# Patient Record
Sex: Female | Born: 1950 | Race: White | Hispanic: No | Marital: Married | Smoking: Former smoker
Health system: Southern US, Community
[De-identification: ages and names within clinical notes are randomized; demographics above are authoritative.]

## PROBLEM LIST (undated history)

## (undated) ENCOUNTER — Emergency Department (HOSPITAL_COMMUNITY): Payer: Medicare Other

## (undated) DIAGNOSIS — E11319 Type 2 diabetes mellitus with unspecified diabetic retinopathy without macular edema: Secondary | ICD-10-CM

## (undated) DIAGNOSIS — G4733 Obstructive sleep apnea (adult) (pediatric): Secondary | ICD-10-CM

## (undated) DIAGNOSIS — M797 Fibromyalgia: Secondary | ICD-10-CM

## (undated) DIAGNOSIS — G540 Brachial plexus disorders: Secondary | ICD-10-CM

## (undated) DIAGNOSIS — M069 Rheumatoid arthritis, unspecified: Secondary | ICD-10-CM

## (undated) DIAGNOSIS — Z91018 Allergy to other foods: Secondary | ICD-10-CM

## (undated) DIAGNOSIS — K760 Fatty (change of) liver, not elsewhere classified: Secondary | ICD-10-CM

## (undated) DIAGNOSIS — G894 Chronic pain syndrome: Secondary | ICD-10-CM

## (undated) DIAGNOSIS — N189 Chronic kidney disease, unspecified: Secondary | ICD-10-CM

## (undated) DIAGNOSIS — E7800 Pure hypercholesterolemia, unspecified: Secondary | ICD-10-CM

## (undated) DIAGNOSIS — M1990 Unspecified osteoarthritis, unspecified site: Secondary | ICD-10-CM

## (undated) DIAGNOSIS — R600 Localized edema: Secondary | ICD-10-CM

## (undated) DIAGNOSIS — G709 Myoneural disorder, unspecified: Secondary | ICD-10-CM

## (undated) DIAGNOSIS — M5136 Other intervertebral disc degeneration, lumbar region: Secondary | ICD-10-CM

## (undated) DIAGNOSIS — F419 Anxiety disorder, unspecified: Secondary | ICD-10-CM

## (undated) DIAGNOSIS — I00 Rheumatic fever without heart involvement: Secondary | ICD-10-CM

## (undated) DIAGNOSIS — R1310 Dysphagia, unspecified: Secondary | ICD-10-CM

## (undated) DIAGNOSIS — E1142 Type 2 diabetes mellitus with diabetic polyneuropathy: Secondary | ICD-10-CM

## (undated) DIAGNOSIS — G459 Transient cerebral ischemic attack, unspecified: Secondary | ICD-10-CM

## (undated) DIAGNOSIS — T7840XA Allergy, unspecified, initial encounter: Secondary | ICD-10-CM

## (undated) DIAGNOSIS — F32A Depression, unspecified: Secondary | ICD-10-CM

## (undated) DIAGNOSIS — K219 Gastro-esophageal reflux disease without esophagitis: Secondary | ICD-10-CM

## (undated) DIAGNOSIS — K589 Irritable bowel syndrome without diarrhea: Secondary | ICD-10-CM

## (undated) DIAGNOSIS — E785 Hyperlipidemia, unspecified: Secondary | ICD-10-CM

## (undated) DIAGNOSIS — N183 Chronic kidney disease, stage 3 unspecified: Secondary | ICD-10-CM

## (undated) DIAGNOSIS — K5900 Constipation, unspecified: Secondary | ICD-10-CM

## (undated) DIAGNOSIS — N2889 Other specified disorders of kidney and ureter: Secondary | ICD-10-CM

## (undated) DIAGNOSIS — M51369 Other intervertebral disc degeneration, lumbar region without mention of lumbar back pain or lower extremity pain: Secondary | ICD-10-CM

## (undated) DIAGNOSIS — F329 Major depressive disorder, single episode, unspecified: Secondary | ICD-10-CM

## (undated) DIAGNOSIS — Z5189 Encounter for other specified aftercare: Secondary | ICD-10-CM

## (undated) DIAGNOSIS — K259 Gastric ulcer, unspecified as acute or chronic, without hemorrhage or perforation: Secondary | ICD-10-CM

## (undated) DIAGNOSIS — I10 Essential (primary) hypertension: Secondary | ICD-10-CM

## (undated) DIAGNOSIS — IMO0002 Reserved for concepts with insufficient information to code with codable children: Secondary | ICD-10-CM

## (undated) DIAGNOSIS — H269 Unspecified cataract: Secondary | ICD-10-CM

## (undated) HISTORY — DX: Type 2 diabetes mellitus with unspecified diabetic retinopathy without macular edema: E11.319

## (undated) HISTORY — DX: Anxiety disorder, unspecified: F41.9

## (undated) HISTORY — DX: Unspecified cataract: H26.9

## (undated) HISTORY — DX: Constipation, unspecified: K59.00

## (undated) HISTORY — DX: Allergy, unspecified, initial encounter: T78.40XA

## (undated) HISTORY — PX: BREAST BIOPSY: SHX20

## (undated) HISTORY — DX: Dysphagia, unspecified: R13.10

## (undated) HISTORY — DX: Depression, unspecified: F32.A

## (undated) HISTORY — DX: Type 2 diabetes mellitus with diabetic polyneuropathy: E11.42

## (undated) HISTORY — PX: CARPAL TUNNEL RELEASE: SHX101

## (undated) HISTORY — DX: Fibromyalgia: M79.7

## (undated) HISTORY — DX: Essential (primary) hypertension: I10

## (undated) HISTORY — DX: Chronic kidney disease, unspecified: N18.9

## (undated) HISTORY — DX: Rheumatic fever without heart involvement: I00

## (undated) HISTORY — DX: Myoneural disorder, unspecified: G70.9

## (undated) HISTORY — DX: Unspecified osteoarthritis, unspecified site: M19.90

## (undated) HISTORY — DX: Other specified disorders of kidney and ureter: N28.89

## (undated) HISTORY — DX: Reserved for concepts with insufficient information to code with codable children: IMO0002

## (undated) HISTORY — PX: OTHER SURGICAL HISTORY: SHX169

## (undated) HISTORY — DX: Other intervertebral disc degeneration, lumbar region without mention of lumbar back pain or lower extremity pain: M51.369

## (undated) HISTORY — DX: Fatty (change of) liver, not elsewhere classified: K76.0

## (undated) HISTORY — DX: Rheumatoid arthritis, unspecified: M06.9

## (undated) HISTORY — DX: Encounter for other specified aftercare: Z51.89

## (undated) HISTORY — DX: Allergy to other foods: Z91.018

## (undated) HISTORY — DX: Irritable bowel syndrome, unspecified: K58.9

## (undated) HISTORY — DX: Gastro-esophageal reflux disease without esophagitis: K21.9

## (undated) HISTORY — DX: Gastric ulcer, unspecified as acute or chronic, without hemorrhage or perforation: K25.9

## (undated) HISTORY — DX: Brachial plexus disorders: G54.0

## (undated) HISTORY — DX: Obstructive sleep apnea (adult) (pediatric): G47.33

## (undated) HISTORY — DX: Chronic pain syndrome: G89.4

## (undated) HISTORY — DX: Transient cerebral ischemic attack, unspecified: G45.9

## (undated) HISTORY — DX: Chronic kidney disease, stage 3 unspecified: N18.30

## (undated) HISTORY — PX: CHOLECYSTECTOMY: SHX55

## (undated) HISTORY — DX: Hyperlipidemia, unspecified: E78.5

## (undated) HISTORY — PX: ABDOMINAL SURGERY: SHX537

## (undated) HISTORY — PX: TONSILLECTOMY: SUR1361

## (undated) HISTORY — DX: Pure hypercholesterolemia, unspecified: E78.00

## (undated) HISTORY — DX: Chronic kidney disease, stage 3 (moderate): N18.3

## (undated) HISTORY — DX: Localized edema: R60.0

## (undated) HISTORY — DX: Other intervertebral disc degeneration, lumbar region: M51.36

## (undated) HISTORY — PX: CATARACT EXTRACTION, BILATERAL: SHX1313

## (undated) HISTORY — PX: EYE SURGERY: SHX253

## (undated) HISTORY — DX: Major depressive disorder, single episode, unspecified: F32.9

---

## 1979-11-02 HISTORY — PX: ABDOMINAL HYSTERECTOMY: SHX81

## 1998-04-11 ENCOUNTER — Encounter: Admission: RE | Admit: 1998-04-11 | Discharge: 1998-04-11 | Payer: Self-pay | Admitting: Internal Medicine

## 1998-05-08 ENCOUNTER — Emergency Department (HOSPITAL_COMMUNITY): Admission: EM | Admit: 1998-05-08 | Discharge: 1998-05-08 | Payer: Self-pay | Admitting: Emergency Medicine

## 1998-05-20 ENCOUNTER — Encounter: Admission: RE | Admit: 1998-05-20 | Discharge: 1998-05-20 | Payer: Self-pay | Admitting: Internal Medicine

## 1998-06-25 ENCOUNTER — Ambulatory Visit (HOSPITAL_COMMUNITY): Admission: RE | Admit: 1998-06-25 | Discharge: 1998-06-25 | Payer: Self-pay | Admitting: Specialist

## 1998-07-15 ENCOUNTER — Ambulatory Visit (HOSPITAL_COMMUNITY): Admission: RE | Admit: 1998-07-15 | Discharge: 1998-07-15 | Payer: Self-pay | Admitting: Specialist

## 1998-08-14 ENCOUNTER — Encounter: Admission: RE | Admit: 1998-08-14 | Discharge: 1998-08-14 | Payer: Self-pay | Admitting: Internal Medicine

## 1998-08-14 ENCOUNTER — Ambulatory Visit (HOSPITAL_COMMUNITY): Admission: RE | Admit: 1998-08-14 | Discharge: 1998-08-14 | Payer: Self-pay | Admitting: Internal Medicine

## 1999-01-28 ENCOUNTER — Encounter: Admission: RE | Admit: 1999-01-28 | Discharge: 1999-01-28 | Payer: Self-pay | Admitting: Internal Medicine

## 1999-02-02 ENCOUNTER — Encounter: Admission: RE | Admit: 1999-02-02 | Discharge: 1999-02-02 | Payer: Self-pay | Admitting: Internal Medicine

## 1999-02-23 ENCOUNTER — Encounter: Admission: RE | Admit: 1999-02-23 | Discharge: 1999-05-24 | Payer: Self-pay | Admitting: *Deleted

## 1999-04-08 ENCOUNTER — Encounter: Admission: RE | Admit: 1999-04-08 | Discharge: 1999-04-08 | Payer: Self-pay | Admitting: Internal Medicine

## 1999-06-29 ENCOUNTER — Encounter: Admission: RE | Admit: 1999-06-29 | Discharge: 1999-09-27 | Payer: Self-pay | Admitting: *Deleted

## 1999-12-28 ENCOUNTER — Encounter: Admission: RE | Admit: 1999-12-28 | Discharge: 1999-12-28 | Payer: Self-pay | Admitting: Internal Medicine

## 2000-03-11 ENCOUNTER — Encounter: Admission: RE | Admit: 2000-03-11 | Discharge: 2000-03-11 | Payer: Self-pay | Admitting: Internal Medicine

## 2000-04-18 ENCOUNTER — Encounter: Admission: RE | Admit: 2000-04-18 | Discharge: 2000-04-18 | Payer: Self-pay | Admitting: Hematology and Oncology

## 2000-06-13 ENCOUNTER — Emergency Department (HOSPITAL_COMMUNITY): Admission: EM | Admit: 2000-06-13 | Discharge: 2000-06-13 | Payer: Self-pay | Admitting: Emergency Medicine

## 2000-09-19 ENCOUNTER — Encounter: Admission: RE | Admit: 2000-09-19 | Discharge: 2000-09-19 | Payer: Self-pay | Admitting: Internal Medicine

## 2000-10-03 ENCOUNTER — Encounter: Admission: RE | Admit: 2000-10-03 | Discharge: 2000-10-03 | Payer: Self-pay | Admitting: Internal Medicine

## 2000-10-21 ENCOUNTER — Encounter: Admission: RE | Admit: 2000-10-21 | Discharge: 2000-10-21 | Payer: Self-pay | Admitting: Internal Medicine

## 2000-11-18 ENCOUNTER — Emergency Department (HOSPITAL_COMMUNITY): Admission: EM | Admit: 2000-11-18 | Discharge: 2000-11-19 | Payer: Self-pay | Admitting: Emergency Medicine

## 2001-09-12 ENCOUNTER — Encounter: Admission: RE | Admit: 2001-09-12 | Discharge: 2001-09-12 | Payer: Self-pay

## 2001-09-19 ENCOUNTER — Ambulatory Visit (HOSPITAL_COMMUNITY): Admission: RE | Admit: 2001-09-19 | Discharge: 2001-09-19 | Payer: Self-pay

## 2001-10-17 ENCOUNTER — Encounter: Admission: RE | Admit: 2001-10-17 | Discharge: 2001-10-17 | Payer: Self-pay | Admitting: *Deleted

## 2001-12-19 ENCOUNTER — Encounter: Admission: RE | Admit: 2001-12-19 | Discharge: 2001-12-19 | Payer: Self-pay | Admitting: Internal Medicine

## 2001-12-21 ENCOUNTER — Inpatient Hospital Stay (HOSPITAL_COMMUNITY): Admission: EM | Admit: 2001-12-21 | Discharge: 2001-12-29 | Payer: Self-pay | Admitting: Emergency Medicine

## 2001-12-21 ENCOUNTER — Encounter (INDEPENDENT_AMBULATORY_CARE_PROVIDER_SITE_OTHER): Payer: Self-pay | Admitting: Specialist

## 2001-12-23 ENCOUNTER — Encounter: Payer: Self-pay | Admitting: Emergency Medicine

## 2002-01-02 ENCOUNTER — Encounter: Admission: RE | Admit: 2002-01-02 | Discharge: 2002-01-02 | Payer: Self-pay | Admitting: *Deleted

## 2002-03-07 ENCOUNTER — Encounter: Admission: RE | Admit: 2002-03-07 | Discharge: 2002-03-07 | Payer: Self-pay

## 2002-05-11 ENCOUNTER — Encounter: Admission: RE | Admit: 2002-05-11 | Discharge: 2002-05-11 | Payer: Self-pay | Admitting: Internal Medicine

## 2003-01-12 ENCOUNTER — Emergency Department (HOSPITAL_COMMUNITY): Admission: EM | Admit: 2003-01-12 | Discharge: 2003-01-12 | Payer: Self-pay | Admitting: Emergency Medicine

## 2004-02-01 ENCOUNTER — Emergency Department (HOSPITAL_COMMUNITY): Admission: EM | Admit: 2004-02-01 | Discharge: 2004-02-01 | Payer: Self-pay

## 2004-03-04 ENCOUNTER — Emergency Department (HOSPITAL_COMMUNITY): Admission: EM | Admit: 2004-03-04 | Discharge: 2004-03-04 | Payer: Self-pay | Admitting: Emergency Medicine

## 2004-05-23 ENCOUNTER — Emergency Department (HOSPITAL_COMMUNITY): Admission: EM | Admit: 2004-05-23 | Discharge: 2004-05-23 | Payer: Self-pay | Admitting: Emergency Medicine

## 2007-11-02 HISTORY — PX: CARDIAC CATHETERIZATION: SHX172

## 2008-09-27 ENCOUNTER — Emergency Department (HOSPITAL_COMMUNITY): Admission: EM | Admit: 2008-09-27 | Discharge: 2008-09-27 | Payer: Self-pay | Admitting: Emergency Medicine

## 2009-07-18 ENCOUNTER — Ambulatory Visit: Payer: Self-pay | Admitting: Internal Medicine

## 2009-07-18 ENCOUNTER — Inpatient Hospital Stay (HOSPITAL_COMMUNITY): Admission: EM | Admit: 2009-07-18 | Discharge: 2009-07-21 | Payer: Self-pay | Admitting: Emergency Medicine

## 2011-02-05 LAB — CBC
HCT: 31.3 % — ABNORMAL LOW (ref 36.0–46.0)
Hemoglobin: 10.6 g/dL — ABNORMAL LOW (ref 12.0–15.0)
Hemoglobin: 9.7 g/dL — ABNORMAL LOW (ref 12.0–15.0)
MCHC: 33.8 g/dL (ref 30.0–36.0)
MCHC: 33.9 g/dL (ref 30.0–36.0)
MCV: 90.4 fL (ref 78.0–100.0)
Platelets: 280 10*3/uL (ref 150–400)
Platelets: 293 10*3/uL (ref 150–400)
Platelets: 339 10*3/uL (ref 150–400)
RBC: 3.46 MIL/uL — ABNORMAL LOW (ref 3.87–5.11)
RDW: 13.4 % (ref 11.5–15.5)
RDW: 13.4 % (ref 11.5–15.5)
RDW: 13.6 % (ref 11.5–15.5)
WBC: 9.3 10*3/uL (ref 4.0–10.5)

## 2011-02-05 LAB — CARDIAC PANEL(CRET KIN+CKTOT+MB+TROPI)
CK, MB: 1.8 ng/mL (ref 0.3–4.0)
CK, MB: 1.9 ng/mL (ref 0.3–4.0)
Total CK: 108 U/L (ref 7–177)
Total CK: 79 U/L (ref 7–177)
Troponin I: 0.01 ng/mL (ref 0.00–0.06)

## 2011-02-05 LAB — LIPID PANEL
Cholesterol: 184 mg/dL (ref 0–200)
LDL Cholesterol: 108 mg/dL — ABNORMAL HIGH (ref 0–99)
Triglycerides: 43 mg/dL (ref <150)

## 2011-02-05 LAB — DIFFERENTIAL
Lymphocytes Relative: 25 % (ref 12–46)
Lymphs Abs: 2.3 10*3/uL (ref 0.7–4.0)
Monocytes Absolute: 0.4 10*3/uL (ref 0.1–1.0)
Monocytes Relative: 4 % (ref 3–12)
Neutro Abs: 6.3 10*3/uL (ref 1.7–7.7)
Neutrophils Relative %: 68 % (ref 43–77)

## 2011-02-05 LAB — GLUCOSE, CAPILLARY
Glucose-Capillary: 120 mg/dL — ABNORMAL HIGH (ref 70–99)
Glucose-Capillary: 153 mg/dL — ABNORMAL HIGH (ref 70–99)
Glucose-Capillary: 202 mg/dL — ABNORMAL HIGH (ref 70–99)
Glucose-Capillary: 215 mg/dL — ABNORMAL HIGH (ref 70–99)
Glucose-Capillary: 98 mg/dL (ref 70–99)

## 2011-02-05 LAB — HEPARIN LEVEL (UNFRACTIONATED)
Heparin Unfractionated: 0.2 IU/mL — ABNORMAL LOW (ref 0.30–0.70)
Heparin Unfractionated: 0.38 IU/mL (ref 0.30–0.70)

## 2011-02-05 LAB — BASIC METABOLIC PANEL
BUN: 21 mg/dL (ref 6–23)
CO2: 24 mEq/L (ref 19–32)
Calcium: 9.3 mg/dL (ref 8.4–10.5)
Chloride: 105 mEq/L (ref 96–112)
Creatinine, Ser: 1.02 mg/dL (ref 0.4–1.2)
GFR calc Af Amer: >60 mL/min (ref >60)
GFR calc non Af Amer: 56 mL/min — ABNORMAL LOW (ref >60)
Glucose, Bld: 67 mg/dL — ABNORMAL LOW (ref 70–99)
Potassium: 4.9 mEq/L (ref 3.5–5.1)
Sodium: 136 mEq/L (ref 135–145)

## 2011-02-05 LAB — IRON AND TIBC
Saturation Ratios: 21 % (ref 20–55)
UIBC: 277 ug/dL

## 2011-02-05 LAB — CK TOTAL AND CKMB (NOT AT ARMC)
CK, MB: 2.4 ng/mL (ref 0.3–4.0)
Relative Index: 2.3 (ref 0.0–2.5)
Total CK: 103 U/L (ref 7–177)

## 2011-02-05 LAB — HEMOGLOBIN A1C
Hgb A1c MFr Bld: 5.8 % (ref 4.6–6.1)
Mean Plasma Glucose: 120 mg/dL

## 2011-02-05 LAB — TSH: TSH: 1.926 u[IU]/mL (ref 0.350–4.500)

## 2011-02-17 ENCOUNTER — Other Ambulatory Visit: Payer: Self-pay | Admitting: Dermatology

## 2011-03-19 NOTE — Op Note (Signed)
[place]. [facility]  Patient:    Angel French Visit Number: [id number] MRN: [id number]          Service Type: MED Location: [id number] Attending Physician:  Phifer, Trinna Post Dictated by:   Adolph Pollack, M.D. Proc. Date: 12/23/01 Admit Date:  12/21/2001                             Operative Report  INCOMPLETE  PREOPERATIVE DIAGNOSIS:  Abdominal wall abscess.  POSTOPERATIVE DIAGNOSIS:  Abdominal wall abscess.  PROCEDURE:  Complex incision and drainage of abdominal wall abscess.  SURGEON:  Adolph Pollack, M.D.  ANESTHESIA:  General.  INDICATION:  [name] is a 60 year old insulin-dependent diabetic admitted 12/21/01.  She developed an abdominal wall infection and was tried on oral antibiotics as an outpatient, but these failed.  She subsequently was brought into the hospital and placed on IV antibiotics, but her condition has not improved.  She continues to spike high fevers.  She has an area of cellulitis with an indurated area of skin that is discolored.  She is now brought to the operating room for incision and drainage.  TECHNIQUE:  After the procedure and the risks were discussed with her including but not limited to bleeding, recurrent infection , and anesthesia, she understood these and agreed to proceed.  She was then brought to the operating room and given a general anesthetic.  The left lower quadrant of the abdomen was sterilely prepped and draped.  An elliptical incision was made around the area of skin-type necrosis, and the purulent cavity entered.  The fluid was sent for culture. Dictated by:   Adolph Pollack, M.D. Attending Physician:  Phifer, Trinna Post DD:  12/23/01 TD:  12/24/01 Job: [id number]

## 2011-03-19 NOTE — Discharge Summary (Signed)
[place]. [facility]  Patient:    Angel French Visit Number: [id number] MRN: [id number]          Service Type: MED Location: [id number] Attending Physician:  Phifer, Trinna Post Dictated by:   Juanell Fairly, M.D. Admit Date:  12/21/2001 Discharge Date: 12/29/2001   CC:         Angel French. Angel French, M.D.  Angel French, M.D.   Discharge Summary  DATE OF BIRTH:  [birth year]  DISCHARGE DIAGNOSES: 1. Abscess. 2. Diabetes mellitus. 3. Anemia. 4. Hypokalemia.  DISCHARGE MEDICATIONS: 1. Resume home medications. 2. Augmentin 875 mg p.o. b.i.d. 3. Abreva five times a day until lip lesion cleared. 4. Vicodin 5/500 one to two tablets p.o. q.6h. p.r.n. pain.  SPECIAL INSTRUCTIONS:  She was instructed to have no pets in her bed.  She will be visited by home health care nursing to change dressings daily.  DIET:  ADA diet.  FOLLOWUP:  She was instructed to call Dr. Abbey Chatters for a follow-up appointment in one week and to call the outpatient clinics for an appointment in one week.  HISTORY OF PRESENT ILLNESS:  Angel French is a 60 year old female who presented with a six-day history of abdominal pain.  She went to the outpatient clinic two days prior to admission and was given Augmentin and Darvocet.  Her abdominal pain did not improve and she had erythema with an increasing diameter and began having nausea, vomiting and diarrhea.  She also had subjective chills and fevers.  PHYSICAL EXAMINATION:  VITAL SIGNS:  Temperature 98.4, blood pressure 112/60, heart rate 102, respiratory rate 20, O2 saturations 100% on room air.  ABDOMEN:  She had a 20 x 30 cm of erythema in the left lower quadrant of induration and swelling approximately 3 x 5 cm.  HOSPITAL COURSE:  #1 - ABSCESS AND CELLULITIS:  The patient failed outpatient treatment with Augmentin and has cellulitis complicated by the fact that she has diabetes.  Therefore, she was  placed on imipenem.  A surgery consult was obtained and they subsequently incised and drained the central area of induration.  She was placed on vacuum-assisted closure on February 24, two days status post I&D.  She subsequently had some erythema and tenderness around the edges of her wound.  The wound V.A.C. was discontinued and the erythema resolved.  On February 28, postop day #6 and total antibiotic day #7 including three days of Augmentin and four days of imipenem, the patient was discharged home with Augmentin.  #2 - DIABETES:  On admission, the patients CBGs were not well-controlled. Prior to admission, her CBG was 89.  The morning after admission, it was 219. She was placed on q.6h. CBGs and a sliding scale until she began eating at which point she was placed on her home dose of Lantus and sliding scale.  By discharge, her CBGs were ranging between 90 and 165.  #3 - ANEMIA:  This was secondary to blood loss during her I&D and was stable on discharge at around 10.  #4 - HYPOKALEMIA:  This was secondary to vomiting prior to admission.  Her potassium was repleted and by discharge was 3.8.  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  DISCHARGE LABORATORY DATA AND X-RAY FINDINGS:  WBC 7.3, which was down from 13.3 on admission, hemoglobin 10.7, hematocrit 31.3, MCV 86.9, platelet count 343.  Sodium 141, potassium 3.8, chloride 105, bicarb 30, glucose  147, BUN 5, creatinine 0.7, calcium 7.9.  Wound cultures from her I&D showed no growth. Dictated by:   Juanell Fairly, M.D. Attending Physician:  Phifer, Trinna Post DD:  01/11/02 TD:  01/12/02 Job: [id number]

## 2011-03-19 NOTE — Op Note (Signed)
[place]. [facility]  Patient:    Angel French Visit Number: [id number] MRN: [id number]          Service Type: MED Location: [phone] Attending Physician:  Phifer, Trinna Post Dictated by:   Adolph Pollack, M.D. Proc. Date: 12/23/01 Admit Date:  12/21/2001                             Operative Report  PREOPERATIVE DIAGNOSIS:  Complex abdominal wall abscess.  POSTOPERATIVE DIAGNOSIS:  Complex abdominal wall abscess.  PROCEDURE:  Complex incision and drainage of abdominal wall abscess.  SURGEON:  Adolph Pollack, M.D.  ANESTHESIA:  General.  INDICATION:  This is a 60 year old insulin-dependent diabetic who has developed a left lower quadrant cellulitis and indurated area.  It has not responded to antibiotics.  She now presents for incision and drainage.  TECHNIQUE:  She was placed supine on the operating table, and general anesthetic was administered.  The abdomen was sterilely prepped and draped. An elliptical incision was made around the indurated area which had necrotic skin in it.  The full-thickness elliptical mass was removed, containing purulent material.  Some of this purulent material was cultured.  Next, the wound was inspected, and subcutaneous tissue noted.  Bleeding was controlled with cautery and interrupted 4-0 Vicryl sutures.  A moist gauze was then placed in the wound, and dry dressings were applied.  She tolerated the procedure well without any apparent complications and was taken to the recovery room in satisfactory condition.  Postoperatively, we will try vacuum-assisted closure later next week. Dictated by:   Adolph Pollack, M.D. Attending Physician:  Phifer, Trinna Post DD:  12/23/01 TD:  12/24/01 Job: [id number]

## 2011-10-03 ENCOUNTER — Emergency Department (HOSPITAL_BASED_OUTPATIENT_CLINIC_OR_DEPARTMENT_OTHER)
Admission: EM | Admit: 2011-10-03 | Discharge: 2011-10-03 | Disposition: A | Discharge status: Home / Self Care | Payer: No Typology Code available for payment source | Attending: Emergency Medicine | Admitting: Emergency Medicine

## 2011-10-03 ENCOUNTER — Emergency Department (INDEPENDENT_AMBULATORY_CARE_PROVIDER_SITE_OTHER): Payer: No Typology Code available for payment source

## 2011-10-03 ENCOUNTER — Encounter: Payer: Self-pay | Admitting: Emergency Medicine

## 2011-10-03 DIAGNOSIS — S9030XA Contusion of unspecified foot, initial encounter: Secondary | ICD-10-CM

## 2011-10-03 DIAGNOSIS — R0789 Other chest pain: Secondary | ICD-10-CM

## 2011-10-03 DIAGNOSIS — Y92410 Unspecified street and highway as the place of occurrence of the external cause: Secondary | ICD-10-CM | POA: Insufficient documentation

## 2011-10-03 DIAGNOSIS — S46919A Strain of unspecified muscle, fascia and tendon at shoulder and upper arm level, unspecified arm, initial encounter: Secondary | ICD-10-CM

## 2011-10-03 DIAGNOSIS — E119 Type 2 diabetes mellitus without complications: Secondary | ICD-10-CM | POA: Insufficient documentation

## 2011-10-03 DIAGNOSIS — M542 Cervicalgia: Secondary | ICD-10-CM

## 2011-10-03 DIAGNOSIS — R071 Chest pain on breathing: Secondary | ICD-10-CM | POA: Insufficient documentation

## 2011-10-03 DIAGNOSIS — IMO0002 Reserved for concepts with insufficient information to code with codable children: Secondary | ICD-10-CM | POA: Insufficient documentation

## 2011-10-03 DIAGNOSIS — M47812 Spondylosis without myelopathy or radiculopathy, cervical region: Secondary | ICD-10-CM

## 2011-10-03 DIAGNOSIS — S139XXA Sprain of joints and ligaments of unspecified parts of neck, initial encounter: Secondary | ICD-10-CM | POA: Insufficient documentation

## 2011-10-03 DIAGNOSIS — S161XXA Strain of muscle, fascia and tendon at neck level, initial encounter: Secondary | ICD-10-CM

## 2011-10-03 DIAGNOSIS — M25519 Pain in unspecified shoulder: Secondary | ICD-10-CM

## 2011-10-03 DIAGNOSIS — M79609 Pain in unspecified limb: Secondary | ICD-10-CM

## 2011-10-03 DIAGNOSIS — Z8739 Personal history of other diseases of the musculoskeletal system and connective tissue: Secondary | ICD-10-CM | POA: Insufficient documentation

## 2011-10-03 HISTORY — DX: Unspecified osteoarthritis, unspecified site: M19.90

## 2011-10-03 MED ORDER — HYDROCODONE-ACETAMINOPHEN 10-500 MG PO TABS: 1.0000 | ORAL_TABLET | Freq: Four times a day (QID) | ORAL | Status: AC | PRN

## 2011-10-03 NOTE — ED Provider Notes (Signed)
Medical screening examination/treatment/procedure(s) were performed by non-physician practitioner and as supervising physician I was immediately available for consultation/collaboration.  Golden Emile, MD 10/03/11 2118 
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
## 2012-11-30 ENCOUNTER — Ambulatory Visit: Payer: Self-pay

## 2012-11-30 ENCOUNTER — Ambulatory Visit: Payer: Self-pay | Admitting: Family Medicine

## 2012-11-30 VITALS — BP 123/85 | HR 139 | Temp 99.0°F | Resp 24 | Ht 63.5 in | Wt 221.0 lb

## 2012-11-30 DIAGNOSIS — B379 Candidiasis, unspecified: Secondary | ICD-10-CM

## 2012-11-30 DIAGNOSIS — R05 Cough: Secondary | ICD-10-CM

## 2012-11-30 DIAGNOSIS — R110 Nausea: Secondary | ICD-10-CM

## 2012-11-30 DIAGNOSIS — R509 Fever, unspecified: Secondary | ICD-10-CM

## 2012-11-30 DIAGNOSIS — E119 Type 2 diabetes mellitus without complications: Secondary | ICD-10-CM

## 2012-11-30 DIAGNOSIS — B49 Unspecified mycosis: Secondary | ICD-10-CM

## 2012-11-30 DIAGNOSIS — R6889 Other general symptoms and signs: Secondary | ICD-10-CM

## 2012-11-30 DIAGNOSIS — R300 Dysuria: Secondary | ICD-10-CM

## 2012-11-30 DIAGNOSIS — J209 Acute bronchitis, unspecified: Secondary | ICD-10-CM

## 2012-11-30 LAB — POCT URINALYSIS DIPSTICK
Nitrite, UA: NEGATIVE
Urobilinogen, UA: 0.2
pH, UA: 5.5

## 2012-11-30 LAB — POCT CBC
Granulocyte percent: 76.3 %G (ref 37–80)
MID (cbc): 0.8 (ref 0–0.9)
MPV: 7.4 fL (ref 0–99.8)
POC Granulocyte: 10.9 — AB (ref 2–6.9)
POC MID %: 5.6 %M (ref 0–12)
Platelet Count, POC: 389 10*3/uL (ref 142–424)
RBC: 3.78 M/uL — AB (ref 4.04–5.48)

## 2012-11-30 LAB — POCT INFLUENZA A/B
Influenza A, POC: NEGATIVE
Influenza B, POC: NEGATIVE

## 2012-11-30 LAB — POCT UA - MICROSCOPIC ONLY
Casts, Ur, LPF, POC: NEGATIVE
Crystals, Ur, HPF, POC: NEGATIVE

## 2012-11-30 MED ORDER — FLUCONAZOLE 150 MG PO TABS: 150.0000 mg | ORAL_TABLET | Freq: Once | ORAL | Status: DC

## 2012-11-30 MED ORDER — IPRATROPIUM BROMIDE 0.03 % NA SOLN: 2.0000 | Freq: Two times a day (BID) | NASAL | Status: DC

## 2012-11-30 MED ORDER — AZITHROMYCIN 250 MG PO TABS: ORAL_TABLET | ORAL | Status: DC

## 2012-11-30 MED ORDER — ONDANSETRON 8 MG PO TBDP: 8.0000 mg | ORAL_TABLET | Freq: Three times a day (TID) | ORAL | Status: DC | PRN

## 2012-11-30 MED ORDER — ONDANSETRON 4 MG PO TBDP
8.0000 mg | ORAL_TABLET | Freq: Once | ORAL | Status: AC
  Administered 2012-11-30: 8 mg via ORAL

## 2012-11-30 MED ORDER — NYSTATIN 100000 UNIT/GM EX CREA: TOPICAL_CREAM | Freq: Two times a day (BID) | CUTANEOUS | Status: DC

## 2012-11-30 NOTE — Progress Notes (Signed)
[address]   [place], [place]  [address]   [phone]  Subjective:    Patient ID: Angel French, female    DOB: 1951-08-01, 62 y.o.   MRN: [id number]  HPIThis 62 y.o. female presents for evaluation of cough, congestion. Onset ten days ago with fevers.  Improved until one week ago and clinically worsened.  Started hallucinating one week ago; refused to allow daughter to take to ED; no further hallucinations in past week.  The following day, clinically improved.  Yesterday was horrible; acutely SOB; drowning in own drainage.  Stress incontinence with coughing; +dysuria with incontinence.  Called daughter and asked her to bring to doctor.  Very sick.  Chest hurts. Bone hurts; back hurts; rib hurts; long bones hurt.  Last fever persistent; Tmax 103.2. Horrible headache and radiating into neck, shoulders, back of head.  Worried about brain aneurysm; worst headache of life.  Lost ten pounds in ten days.  Ears feel funny; no pain; throat is intermittently scratchy.  Rhinorrhea severe; drainage clear thick.  +PND.  +coughing; +SOB; intermittent sputum.  Loose stools but no diarrhea; +nausea; no vomiting.  S/p flu vaccine.  Sugars running 93, 95.  Taking medication most of them.  No tobacco in twenty years.  Delsym, ASA, Tylenol, Ibuprofen.   At 11:00, 2 crackers, 4 ounces of tea.   Yesterday, drank diet Mt. Dew 8 ounces; no food yesterday.  PCP: Sandre Kitty; does not drive.   Review of Systems  Constitutional: Positive for fever, chills, diaphoresis and fatigue.  HENT: Positive for congestion, sore throat, rhinorrhea and voice change. Negative for ear pain, trouble swallowing and sinus pressure.   Respiratory: Positive for cough and shortness of breath. Negative for wheezing and stridor.   Gastrointestinal: Positive for nausea and diarrhea. Negative for vomiting.  Genitourinary: Positive for dysuria.  Skin: Positive for rash.  Neurological: Positive for light-headedness and headaches. Negative  for dizziness, tremors, syncope and weakness.  Hematological: Negative for adenopathy. Does not bruise/bleed easily.  Psychiatric/Behavioral: Positive for hallucinations.        Past Medical History  Diagnosis Date  . Diabetes mellitus   . Arthritis   . Neuromuscular disorder   . Ulcer   . Anxiety   . Hypertension   . Depression     Past Surgical History  Procedure Date  . Cholecystectomy   . Abdominal hysterectomy     Prior to Admission medications   Medication Sig Start Date End Date Taking? Authorizing Provider  aspirin 325 MG tablet Take 325 mg by mouth 2 (two) times daily.     Yes Historical Provider, MD  benazepril (LOTENSIN) 20 MG tablet Take 20 mg by mouth daily.     Yes Historical Provider, MD  busPIRone (BUSPAR) 5 MG tablet Take 5 mg by mouth 2 (two) times daily.   Yes Historical Provider, MD  etodolac (LODINE) 400 MG tablet Take 400 mg by mouth 2 (two) times daily.     Yes Historical Provider, MD  FLUoxetine (PROZAC) 20 MG capsule Take 60 mg by mouth daily.     Yes Historical Provider, MD  HYDROcodone-acetaminophen (NORCO) 10-325 MG per tablet Take 1 tablet by mouth 2 (two) times daily as needed. For pain    Yes Historical Provider, MD  insulin aspart (NOVOLOG) 100 UNIT/ML injection Inject 10 Units into the skin 3 (three) times daily before meals. Or as directed according to sliding    Yes Historical Provider, MD  insulin glargine (LANTUS) 100 UNIT/ML injection Inject  40 Units into the skin 2 (two) times daily.     Yes Historical Provider, MD  metFORMIN (GLUCOPHAGE) 1000 MG tablet Take 2,000 mg by mouth 2 (two) times daily.     Yes Historical Provider, MD  metoprolol tartrate (LOPRESSOR) 25 MG tablet Take 25 mg by mouth 2 (two) times daily.     Yes Historical Provider, MD  simvastatin (ZOCOR) 20 MG tablet Take 20 mg by mouth at bedtime.     Yes Historical Provider, MD  temazepam (RESTORIL) 30 MG capsule Take 30 mg by mouth at bedtime as needed. For sleep    Yes  Historical Provider, MD  polyethylene glycol powder (GLYCOLAX/MIRALAX) powder Take 17 g by mouth daily.      Historical Provider, MD    Allergies  Allergen Reactions  . Contrast Media (Iodinated Diagnostic Agents) Anaphylaxis  . Nitrofurantoin Monohyd Macro Anaphylaxis  . Iodine Hives  . Ultram (Tramadol Hcl) Nausea And Vomiting    History   Social History  . Marital Status: Married    Spouse Name: N/A    Number of Children: N/A  . Years of Education: N/A   Occupational History  . Not on file.   Social History Main Topics  . Smoking status: Former Games developer  . Smokeless tobacco: Not on file  . Alcohol Use: No  . Drug Use: No  . Sexually Active:    Other Topics Concern  . Not on file   Social History Narrative  . No narrative on file    History reviewed. No pertinent family history.  Objective:   Physical Exam  Nursing note and vitals reviewed. Constitutional: She is oriented to person, place, and time. She appears well-developed and well-nourished. No distress.  HENT:  Head: Normocephalic and atraumatic.  Right Ear: External ear normal.  Left Ear: External ear normal.  Nose: Nose normal.  Mouth/Throat: Oropharynx is clear and moist.  Eyes: Conjunctivae normal are normal. Pupils are equal, round, and reactive to light.  Neck: Normal range of motion. Neck supple.  Cardiovascular: Normal rate, regular rhythm and normal heart sounds.   No murmur heard. Pulmonary/Chest: Effort normal. No respiratory distress. She has decreased breath sounds in the right middle field and the left lower field. She has no wheezes. She has rhonchi in the right middle field, the right lower field, the left middle field and the left lower field. She has no rales.  Abdominal: Soft. Bowel sounds are normal. There is no tenderness. There is no rebound and no guarding.  Lymphadenopathy:    She has cervical adenopathy.  Neurological: She is alert and oriented to person, place, and time. No  cranial nerve deficit. She exhibits normal muscle tone. Coordination normal.  Skin: Rash noted. She is not diaphoretic. There is erythema.       Diffuse erythematous rash lower abdominal folds/crease.  No genital erythema or erythema along inguinal folds.  Psychiatric: She has a normal mood and affect. Her behavior is normal. Judgment and thought content normal.          Results for orders placed in visit on 11/30/12  POCT CBC      Component Value Range   WBC 14.3 (*) 4.6 - 10.2 K/uL   Lymph, poc 2.6  0.6 - 3.4   POC LYMPH PERCENT 18.1  10 - 50 %L   MID (cbc) 0.8  0 - 0.9   POC MID % 5.6  0 - 12 %M   POC Granulocyte 10.9 (*) 2 - 6.9  Granulocyte percent 76.3  37 - 80 %G   RBC 3.78 (*) 4.04 - 5.48 M/uL   Hemoglobin 10.7 (*) 12.2 - 16.2 g/dL   HCT, POC 16.1 (*) 09.6 - 47.9 %   MCV 89.6  80 - 97 fL   MCH, POC 28.3  27 - 31.2 pg   MCHC 31.6 (*) 31.8 - 35.4 g/dL   RDW, POC 04.5     Platelet Count, POC 389  142 - 424 K/uL   MPV 7.4  0 - 99.8 fL  GLUCOSE, POCT (MANUAL RESULT ENTRY)      Component Value Range   POC Glucose 174 (*) 70 - 99 mg/dl  POCT INFLUENZA A/B      Component Value Range   Influenza A, POC Negative     Influenza B, POC Negative    POCT URINALYSIS DIPSTICK      Component Value Range   Color, UA yellow     Clarity, UA hazy     Glucose, UA neg     Bilirubin, UA large     Ketones, UA 40     Spec Grav, UA 1.025     Blood, UA small     pH, UA 5.5     Protein, UA 3+     Urobilinogen, UA 0.2     Nitrite, UA neg     Leukocytes, UA Negative    POCT UA - MICROSCOPIC ONLY      Component Value Range   WBC, Ur, HPF, POC 3-6     RBC, urine, microscopic 2-4     Bacteria, U Microscopic small     Mucus, UA trace     Epithelial cells, urine per micros 2-4     Crystals, Ur, HPF, POC neg     Casts, Ur, LPF, POC neg     Yeast, UA neg     [facility] reading (PRIMARY) by  Dr. Katrinka Blazing. CXR: NAD.  PROCEDURE: 1 LITER NASAL SALINE ADMINISTERED OVER ONE HOUR.  ZOFRAN  8MG  SL  ADMINISTERED X 1.    Assessment & Plan:   1. Fever  POCT CBC, POCT Influenza A/B, DG Chest 2 View  2. Nausea  POCT CBC, Comprehensive metabolic panel, DG Chest 2 View  3. Dysuria  POCT urinalysis dipstick, POCT UA - Microscopic Only  4. Cough  POCT CBC, POCT Influenza A/B, Comprehensive metabolic panel, DG Chest 2 View  5. Yeast infection    6. Type 2 diabetes mellitus  POCT glucose (manual entry)  7. Acute bronchitis    8. Flu-like symptoms    9. Dehydration  1.  Acute Bronchitis:  New.  Rx for Zithromax provided.  Continue Delsym bid; rx for Atrovent nasal spray also provided.  RTC for acute worsening. 2.  Dehydration: New.  Secondary to acute illness with nausea.  S/p 1 liter nasal saline in office; declined additional liter of ivf.  Drank 16 ounces of fluid po during visit. 3.  Nausea:  New.  Improved with Zofran 8mg  in office; rx for Zofran provided; recommend BRAT diet, hydration. RTC inability to maintain good fluid intake. 4.  Candidiasis lower abdomen: New.  Rx for Nystatin cream bid for next two weeks; rx for Diflucan also provided to take after completing Zithromax. 5.  DMII: stable despite acute infection; advised to monitor sugars closely with acute illness. 6.  Flu like symptoms: New.  Acute illness likely due to acute influenza with secondary bacterial infection at this time.    Meds ordered this  encounter  Medications  . busPIRone (BUSPAR) 5 MG tablet    Sig: Take 5 mg by mouth 2 (two) times daily.  . ondansetron (ZOFRAN-ODT) disintegrating tablet 8 mg    Sig:   . azithromycin (ZITHROMAX) 250 MG tablet    Sig: Two tablets daily x 5 days    Dispense:  10 tablet    Refill:  0  . nystatin cream (MYCOSTATIN)    Sig: Apply topically 2 (two) times daily.    Dispense:  30 g    Refill:  3  . fluconazole (DIFLUCAN) 150 MG tablet    Sig: Take 1 tablet (150 mg total) by mouth once. Repeat if needed    Dispense:  2 tablet    Refill:  0  . ondansetron (ZOFRAN-ODT) 8 MG  disintegrating tablet    Sig: Take 1 tablet (8 mg total) by mouth every 8 (eight) hours as needed for nausea.    Dispense:  30 tablet    Refill:  0  . ipratropium (ATROVENT) 0.03 % nasal spray    Sig: Place 2 sprays into the nose 2 (two) times daily.    Dispense:  30 mL    Refill:  5

## 2012-11-30 NOTE — Patient Instructions (Addendum)
1. Fever  POCT CBC, POCT Influenza A/B, DG Chest 2 View  2. Nausea  POCT CBC, Comprehensive metabolic panel, DG Chest 2 View, ondansetron (ZOFRAN-ODT) disintegrating tablet 8 mg  3. Dysuria  POCT urinalysis dipstick, POCT UA - Microscopic Only  4. Cough  POCT CBC, POCT Influenza A/B, Comprehensive metabolic panel, DG Chest 2 View  5. Yeast infection    6. Type 2 diabetes mellitus  POCT glucose (manual entry)  7. Acute bronchitis    8. Flu-like symptoms

## 2012-12-01 LAB — COMPREHENSIVE METABOLIC PANEL
AST: 25 U/L (ref 0–37)
Albumin: 4.1 g/dL (ref 3.5–5.2)
Alkaline Phosphatase: 67 U/L (ref 39–117)
BUN: 17 mg/dL (ref 6–23)
Potassium: 4.4 mEq/L (ref 3.5–5.3)
Total Bilirubin: 0.9 mg/dL (ref 0.3–1.2)

## 2012-12-02 LAB — URINE CULTURE: Colony Count: 60000

## 2012-12-05 ENCOUNTER — Telehealth: Payer: Self-pay

## 2012-12-05 NOTE — Telephone Encounter (Signed)
PATIENT CALLED INQUIRING ABOUT HER TEST RESULTS AND SAID SOMEONE CALLED AND LEFT A MESSAGE.

## 2012-12-05 NOTE — Telephone Encounter (Signed)
See labs 

## 2013-01-01 ENCOUNTER — Ambulatory Visit: Payer: Self-pay | Admitting: Family Medicine

## 2013-01-01 ENCOUNTER — Encounter: Payer: Self-pay | Admitting: Family Medicine

## 2013-01-01 VITALS — BP 122/70 | HR 56 | Temp 96.7°F | Resp 16 | Ht 63.5 in | Wt 229.0 lb

## 2013-01-01 DIAGNOSIS — M51369 Other intervertebral disc degeneration, lumbar region without mention of lumbar back pain or lower extremity pain: Secondary | ICD-10-CM | POA: Insufficient documentation

## 2013-01-01 DIAGNOSIS — E7800 Pure hypercholesterolemia, unspecified: Secondary | ICD-10-CM | POA: Insufficient documentation

## 2013-01-01 DIAGNOSIS — F32A Depression, unspecified: Secondary | ICD-10-CM | POA: Insufficient documentation

## 2013-01-01 DIAGNOSIS — E11319 Type 2 diabetes mellitus with unspecified diabetic retinopathy without macular edema: Secondary | ICD-10-CM | POA: Insufficient documentation

## 2013-01-01 LAB — CBC WITH DIFFERENTIAL/PLATELET
Basophils Absolute: 0 10*3/uL (ref 0.0–0.1)
Basophils Relative: 0 % (ref 0–1)
Eosinophils Absolute: 0.5 10*3/uL (ref 0.0–0.7)
Eosinophils Relative: 8 % — ABNORMAL HIGH (ref 0–5)
HCT: 29.4 % — ABNORMAL LOW (ref 36.0–46.0)
MCHC: 32.3 g/dL (ref 30.0–36.0)
MCV: 89.1 fL (ref 78.0–100.0)
Monocytes Absolute: 0.5 10*3/uL (ref 0.1–1.0)
Platelets: 293 10*3/uL (ref 150–400)
RDW: 15.5 % (ref 11.5–15.5)

## 2013-01-01 LAB — BASIC METABOLIC PANEL
BUN: 34 mg/dL — ABNORMAL HIGH (ref 6–23)
Calcium: 9.1 mg/dL (ref 8.4–10.5)
Glucose, Bld: 81 mg/dL (ref 70–99)
Sodium: 138 mEq/L (ref 135–145)

## 2013-01-01 LAB — LIPID PANEL
Cholesterol: 128 mg/dL (ref 0–200)
Triglycerides: 199 mg/dL — ABNORMAL HIGH (ref <150)

## 2013-01-01 MED ORDER — SIMVASTATIN 40 MG PO TABS: 40.0000 mg | ORAL_TABLET | Freq: Every day | ORAL | Status: DC

## 2013-01-01 MED ORDER — HYDROCODONE-ACETAMINOPHEN 10-325 MG PO TABS: 1.0000 | ORAL_TABLET | Freq: Two times a day (BID) | ORAL | Status: DC | PRN

## 2013-01-01 MED ORDER — METOPROLOL TARTRATE 25 MG PO TABS: 25.0000 mg | ORAL_TABLET | Freq: Two times a day (BID) | ORAL | Status: DC

## 2013-01-01 MED ORDER — FLUOXETINE HCL 20 MG PO CAPS: 60.0000 mg | ORAL_CAPSULE | Freq: Every day | ORAL | Status: DC

## 2013-01-01 NOTE — Assessment & Plan Note (Signed)
Controlled; obtain records; continue Etodolac and hydrocodone.

## 2013-01-01 NOTE — Assessment & Plan Note (Signed)
Controlled with HgbA1c of 5.1; continue current medications.  Obtain records.

## 2013-01-01 NOTE — Assessment & Plan Note (Signed)
Stable; followed regularly by [facility].  Improved glycemic control in past few years.

## 2013-01-01 NOTE — Assessment & Plan Note (Signed)
Stable; continue Etodolac and Hydrocodone.  Refill of hydrocodone provided; obtain records.

## 2013-01-01 NOTE — Assessment & Plan Note (Signed)
Controlled; refill provided.  Pt unsure why ACE stopped.

## 2013-01-01 NOTE — Progress Notes (Signed)
[address]   [place], [place]  [address]   [phone]  Subjective:    Patient ID: Angel French, female    DOB: 05-02-1951, 62 y.o.   MRN: [id number]  HPI This 62 y.o. female presents for evaluation and to establish care.   Last physical 2013.  Pap smear every two years; hysterectomy; 2012.  Mammogram 2012.  Colonoscopy never; no insurance.  Tetanus less than four years ago.  Pneumovax once in past.  Influenza fall 2013.  Zostavax never; no previous chicken pox.  Eye exam due next week; 2013; +glasses; [facility].  Dentist several years ago; goes to [facility] when desperate; only has seven teeth.  DMII:  Onset 1999; s/p Pneumovax in past; s/p flu vaccine; s/p eye exam 2013; appointment next week at [facility].  Strong family history of DMII.  Metformin ER 500mg  two bid.  Lantus 20-30 units [place] q am.  Fasting sugar this morning 91, so took 20 units Lantus.  Checks sugar 1-3 times per day.  Highest sugar 230.  Followed every three months by previous PCP in [facility].  HTN:  Onset ten years ago.  Metoprolol 25mg  bid; previous Lotensin but stopped; no side effect; PCP stopped medication; not sure why Lotensin stopped.  Does not check BP at home.  S/p cardiac catheterization 2010; no stenting; no blockages.    Hypercholesterolemia:  Onset ten years ago.  No previous CVA or AMI.  Depression: onset forever; Prozac 20mg  three daily; also taking Temazepam 30mg  qhs.  Medication was not working; s/p psychiatry consultatoin with multiple medications.  Brother with MR, bipolar; brother lives with pt.  Brother is major stressor.  No counseling.  Brother is violent and incontinent.  Sable for five months.  No mental health admissions for SI; did have nervous breakdown age 55 secondary to death of friend.  3.5 months in [place].    DDD lumbar, R hip OA:  Taking Etodolac tid PRN; hydrocodone 10/325 one bid.  S/p ortho consultation; had to use walker due to pain; multiple xrays.  Etodolac  has really made a difference; with Vicodin, pain is controlled.  Usually takes Vicodin upon awakening and some other time during the day if needed.  May need to take around 6:00-bedtime.  Pain is well controlled almost always.      Review of Systems  Constitutional: Negative for fever, chills, diaphoresis, activity change, appetite change and fatigue.  Eyes: Positive for visual disturbance. Negative for photophobia.  Respiratory: Negative for shortness of breath, wheezing and stridor.   Cardiovascular: Positive for leg swelling. Negative for chest pain and palpitations.  Gastrointestinal: Negative for nausea, vomiting, abdominal pain, diarrhea and constipation.  Endocrine: Negative for cold intolerance, heat intolerance, polydipsia, polyphagia and polyuria.  Musculoskeletal: Positive for myalgias, back pain, arthralgias and gait problem. Negative for joint swelling.  Skin: Negative for color change, pallor and rash.  Neurological: Positive for numbness. Negative for dizziness, tremors, seizures, syncope, facial asymmetry, speech difficulty, weakness, light-headedness and headaches.  Psychiatric/Behavioral: Positive for sleep disturbance and dysphoric mood. Negative for suicidal ideas. The patient is nervous/anxious.         Past Medical History  Diagnosis Date  . Diabetes mellitus   . Neuromuscular disorder   . Anxiety   . Hypertension   . Depression   . Allergy     generic allergy pill; Spring and Fall only.  . Arthritis     DDD lumbar, R hip OA.  s/p ortho consult  in past.  . Diabetic peripheral neuropathy associated with type 2 diabetes mellitus   . Blood transfusion without reported diagnosis     Mountain climbing accident in [place].  . Cataract     B retractions.  Marland Kitchen Ulcer     Peptic ulcer H. Pylori + s/p treatment.  Upper GI diagnosed.[name] Prilosec PRN .  Marland Kitchen Diabetic retinopathy associated with type 2 diabetes mellitus     s/p laser treatment multiple.  Unable to drive.      Past Surgical History  Procedure Laterality Date  . Cholecystectomy    . Eye surgery      Cataracts B.  . Tonsillectomy    . Abdominal hysterectomy      DUB; ovaries intact.  . Carpal tunnel release      Bilateral.  . Behavioral helath admission      age 85; three months in [place].  . Cardiac catheterization  11/02/2007    normal coronary arteries.    Prior to Admission medications   Medication Sig Start Date End Date Taking? Authorizing Provider  aspirin 325 MG tablet Take 325 mg by mouth 2 (two) times daily.     Yes Historical Provider, MD  etodolac (LODINE) 400 MG tablet Take 400 mg by mouth 2 (two) times daily.     Yes Historical Provider, MD  FLUoxetine (PROZAC) 20 MG capsule Take 3 capsules (60 mg total) by mouth daily. 01/01/13  Yes Ethelda Chick, MD  HYDROcodone-acetaminophen (NORCO) 10-325 MG per tablet Take 1 tablet by mouth 2 (two) times daily as needed. For pain 01/01/13  Yes Ethelda Chick, MD  insulin glargine (LANTUS) 100 UNIT/ML injection Inject 40 Units into the skin 2 (two) times daily.     Yes Historical Provider, MD  metFORMIN (GLUCOPHAGE-XR) 500 MG 24 hr tablet Take 1,000 mg by mouth 2 (two) times daily.   Yes Historical Provider, MD  metoprolol tartrate (LOPRESSOR) 25 MG tablet Take 1 tablet (25 mg total) by mouth 2 (two) times daily. 01/01/13  Yes Ethelda Chick, MD  simvastatin (ZOCOR) 40 MG tablet Take 1 tablet (40 mg total) by mouth at bedtime. 01/01/13  Yes Ethelda Chick, MD  temazepam (RESTORIL) 30 MG capsule Take 30 mg by mouth at bedtime as needed. For sleep    Yes Historical Provider, MD  benazepril (LOTENSIN) 20 MG tablet Take 20 mg by mouth daily.      Historical Provider, MD  busPIRone (BUSPAR) 5 MG tablet Take 5 mg by mouth 2 (two) times daily.    Historical Provider, MD  insulin aspart (NOVOLOG) 100 UNIT/ML injection Inject 10 Units into the skin 3 (three) times daily before meals. Or as directed according to sliding     Historical Provider, MD   metFORMIN (GLUCOPHAGE) 1000 MG tablet Take 2,000 mg by mouth 2 (two) times daily.      Historical Provider, MD  polyethylene glycol powder (GLYCOLAX/MIRALAX) powder Take 17 g by mouth daily.      Historical Provider, MD    Allergies  Allergen Reactions  . Contrast Media (Iodinated Diagnostic Agents) Anaphylaxis  . Nitrofurantoin Monohyd Macro Anaphylaxis  . Iodine Hives  . Red Dye Itching  . Ultram (Tramadol Hcl) Nausea And Vomiting    History   Social History  . Marital Status: Married    Spouse Name: N/A    Number of Children: N/A  . Years of Education: N/A   Occupational History  . Not on file.  Social History Main Topics  . Smoking status: Former Games developer  . Smokeless tobacco: Not on file  . Alcohol Use: No  . Drug Use: No  . Sexually Active: Not on file   Other Topics Concern  . Not on file   Social History Narrative   Marital status: widowed since 2009; dating.      Children: 2 children (35 daughter, 41 son estranged); 2 grandchildren.      Lives: with boyfriend, daughter, granddaughter, brother, friend of daughter.  Lives in pt house.      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      Tobacco:  Smoked x 20 years; quit 20 years.      Alcohol:  Never.      Drugs:  None since college.      Exercise:  Walking several times per week.    Family History  Problem Relation Age of Onset  . Adopted: Yes    Objective:   Physical Exam  Nursing note and vitals reviewed. Constitutional: She is oriented to person, place, and time. She appears well-developed and well-nourished. No distress.  HENT:  Mouth/Throat: Oropharynx is clear and moist.  Eyes: Conjunctivae and EOM are normal. Pupils are equal, round, and reactive to light.  Neck: Normal range of motion. Neck supple. No JVD present. No thyromegaly present.  Cardiovascular: Normal rate, regular rhythm, normal heart sounds and intact distal pulses.  Exam  reveals no gallop and no friction rub.   No murmur heard. Pulmonary/Chest: Effort normal and breath sounds normal. She has no wheezes. She has no rales.  Abdominal: Soft. Bowel sounds are normal. There is no tenderness. There is no rebound and no guarding.  Musculoskeletal:       Cervical back: Normal.       Thoracic back: Normal.       Lumbar back: She exhibits decreased range of motion and pain. She exhibits no tenderness and no spasm.  Lymphadenopathy:    She has no cervical adenopathy.  Neurological: She is alert and oriented to person, place, and time. No cranial nerve deficit. She exhibits normal muscle tone. Coordination normal.  Skin: Skin is warm and dry. No rash noted. She is not diaphoretic. No erythema. No pallor.  Psychiatric: She has a normal mood and affect. Her behavior is normal. Judgment and thought content normal.       Assessment & Plan:  Type II or unspecified type diabetes mellitus without mention of complication, not stated as uncontrolled - Plan: CBC with Differential, POCT glycosylated hemoglobin (Hb A1C), Basic metabolic panel  Pure hypercholesterolemia - Plan: Lipid panel, simvastatin (ZOCOR) 40 MG tablet  Essential hypertension, benign - Plan: metoprolol tartrate (LOPRESSOR) 25 MG tablet  Degenerative disc disease, lumbar - Plan: HYDROcodone-acetaminophen (NORCO) 10-325 MG per tablet  Depression - Plan: FLUoxetine (PROZAC) 20 MG capsule  Osteoarthritis of right hip  Diabetic peripheral neuropathy associated with type 2 diabetes mellitus  Diabetic retinopathy  Obesity, unspecified    Meds ordered this encounter  Medications  . metFORMIN (GLUCOPHAGE-XR) 500 MG 24 hr tablet    Sig: Take 1,000 mg by mouth 2 (two) times daily.  . simvastatin (ZOCOR) 40 MG tablet    Sig: Take 1 tablet (40 mg total) by mouth at bedtime.    Dispense:  90 tablet    Refill:  1  . metoprolol tartrate (LOPRESSOR) 25 MG tablet    Sig: Take 1 tablet (25 mg total)  by mouth 2  (two) times daily.    Dispense:  180 tablet    Refill:  1  . FLUoxetine (PROZAC) 20 MG capsule    Sig: Take 3 capsules (60 mg total) by mouth daily.    Dispense:  270 capsule    Refill:  1  . HYDROcodone-acetaminophen (NORCO) 10-325 MG per tablet    Sig: Take 1 tablet by mouth 2 (two) times daily as needed. For pain    Dispense:  90 tablet    Refill:  3

## 2013-01-01 NOTE — Assessment & Plan Note (Signed)
Controlled continue current medications. 

## 2013-01-01 NOTE — Patient Instructions (Addendum)
Type II or unspecified type diabetes mellitus without mention of complication, not stated as uncontrolled - Plan: CBC with Differential, POCT glycosylated hemoglobin (Hb A1C), Basic metabolic panel  Pure hypercholesterolemia - Plan: Lipid panel, simvastatin (ZOCOR) 40 MG tablet  Essential hypertension, benign - Plan: metoprolol tartrate (LOPRESSOR) 25 MG tablet  Degenerative disc disease, lumbar - Plan: HYDROcodone-acetaminophen (NORCO) 10-325 MG per tablet  Depression - Plan: FLUoxetine (PROZAC) 20 MG capsule

## 2013-01-01 NOTE — Assessment & Plan Note (Signed)
Stable; obtain labs; continue current medications; obtain records.

## 2013-01-01 NOTE — Assessment & Plan Note (Signed)
Chronic; no current treatment; advised to evaluate feet and skin regularly.

## 2013-01-01 NOTE — Assessment & Plan Note (Signed)
Chronic; stable.  Encourage weight loss and exercise.

## 2013-01-19 ENCOUNTER — Telehealth: Payer: Self-pay

## 2013-01-19 NOTE — Telephone Encounter (Signed)
Patient called requesting her lab results  CBN:  [phone] (H)

## 2013-02-06 LAB — IFOBT (OCCULT BLOOD)
IFOBT: NEGATIVE
IFOBT: NEGATIVE
IFOBT: NEGATIVE

## 2013-02-06 NOTE — Addendum Note (Signed)
Addended by: Johnnette Litter on: 02/06/2013 05:53 PM   Modules accepted: Orders

## 2013-02-16 NOTE — Progress Notes (Signed)
Patient made appt for 4/28 at 2pm

## 2013-02-26 ENCOUNTER — Ambulatory Visit: Payer: Self-pay | Admitting: Family Medicine

## 2013-02-27 ENCOUNTER — Ambulatory Visit: Payer: Self-pay | Admitting: Family Medicine

## 2013-02-27 ENCOUNTER — Encounter: Payer: Self-pay | Admitting: Family Medicine

## 2013-02-27 VITALS — BP 110/60 | HR 63 | Temp 98.5°F | Resp 16 | Ht 62.0 in | Wt 229.0 lb

## 2013-02-27 DIAGNOSIS — K5900 Constipation, unspecified: Secondary | ICD-10-CM

## 2013-02-27 DIAGNOSIS — E119 Type 2 diabetes mellitus without complications: Secondary | ICD-10-CM

## 2013-02-27 DIAGNOSIS — D649 Anemia, unspecified: Secondary | ICD-10-CM

## 2013-02-27 DIAGNOSIS — S81802A Unspecified open wound, left lower leg, initial encounter: Secondary | ICD-10-CM

## 2013-02-27 DIAGNOSIS — S42109A Fracture of unspecified part of scapula, unspecified shoulder, initial encounter for closed fracture: Secondary | ICD-10-CM

## 2013-02-27 LAB — POCT CBC
HCT, POC: 33.7 % — AB (ref 37.7–47.9)
Lymph, poc: 1.4 (ref 0.6–3.4)
MCH, POC: 27.4 pg (ref 27–31.2)
MCHC: 30.6 g/dL — AB (ref 31.8–35.4)
MCV: 89.6 fL (ref 80–97)
POC LYMPH PERCENT: 22.3 %L (ref 10–50)
RDW, POC: 15.5 %
WBC: 6.5 10*3/uL (ref 4.6–10.2)

## 2013-02-27 MED ORDER — CEPHALEXIN 500 MG PO CAPS: 500.0000 mg | ORAL_CAPSULE | Freq: Three times a day (TID) | ORAL | Status: DC

## 2013-02-27 NOTE — Progress Notes (Signed)
[address]   [place], [place]  [address]   [phone]  Subjective:    Patient ID: Angel French, female    DOB: [birth year], 62 y.o.   MRN: [id number]  HPI This 62 y.o. female presents for evaluation of anemia.  Anemia had worsened at last visit from previous visit.  No previous colonoscopy due to lack of insurance.  Completed stool cards x 3 that were all negative for blood.  Denies bloody stools or melena.  Denies n/v/d; +chronic constipation.  Did not start ferrous sulfate due to severe constipation.  2.  DMII: sugars under excellent control.   3. Wound LLE: present for several weeks; has always been a slow healer.  Now an area of red surrounding leg wound. No active drainage; no fever/chills/sweats.    4.  Constipation: chronic issue for patient; nothing works.     Review of Systems  Constitutional: Negative for fever, chills, diaphoresis, activity change, appetite change and fatigue.  Eyes: Positive for visual disturbance.  Gastrointestinal: Positive for constipation. Negative for nausea, vomiting, abdominal pain, diarrhea, blood in stool, abdominal distention, anal bleeding and rectal pain.  Endocrine: Negative for cold intolerance, heat intolerance, polydipsia, polyphagia and polyuria.  Skin: Positive for color change and wound. Negative for rash.  Hematological: Negative for adenopathy. Does not bruise/bleed easily.     Past Medical History  Diagnosis Date  . Diabetes mellitus   . Neuromuscular disorder   . Anxiety   . Hypertension   . Depression   . Allergy     generic allergy pill; Spring and Fall only.  . Arthritis     DDD lumbar, R hip OA.  s/p ortho consult in past.  . Diabetic peripheral neuropathy associated with type 2 diabetes mellitus   . Blood transfusion without reported diagnosis     Mountain climbing accident in [place].  . Cataract     B retractions.  Marland Kitchen Ulcer     Peptic ulcer H. Pylori + s/p treatment.  Upper GI diagnosed.[name] Prilosec PRN .    Marland Kitchen Diabetic retinopathy associated with type 2 diabetes mellitus     s/p laser treatment multiple.  Unable to drive.    Past Surgical History  Procedure Laterality Date  . Cholecystectomy    . Eye surgery      Cataracts B.  . Tonsillectomy    . Abdominal hysterectomy      DUB; ovaries intact.  . Carpal tunnel release      Bilateral.  . Behavioral helath admission      age 62; three months in [place].  . Cardiac catheterization  11/02/2007    normal coronary arteries.    Prior to Admission medications   Medication Sig Start Date End Date Taking? Authorizing Provider  aspirin 325 MG tablet Take 325 mg by mouth 2 (two) times daily.     Yes Historical Provider, MD  etodolac (LODINE) 400 MG tablet Take 400 mg by mouth 2 (two) times daily.     Yes Historical Provider, MD  insulin glargine (LANTUS) 100 UNIT/ML injection Inject 40 Units into the skin 2 (two) times daily. PER PATIENT USE NO THAN 30 UNITS DAILY AS NEEDED   Yes Historical Provider, MD  metFORMIN (GLUCOPHAGE-XR) 500 MG 24 hr tablet Take 1,000 mg by mouth 2 (two) times daily. NEED REFILLS   Yes Historical Provider, MD  temazepam (RESTORIL) 30 MG capsule Take 30 mg by mouth at bedtime as needed. For sleep  Yes Historical Provider, MD  cephALEXin (KEFLEX) 500 MG capsule Take 1 capsule (500 mg total) by mouth 3 (three) times daily. 04/04/13   Ethelda Chick, MD  FLUoxetine (PROZAC) 20 MG capsule Take 60 mg by mouth daily. NEED REFILLS 01/01/13   Ethelda Chick, MD  furosemide (LASIX) 20 MG tablet Take 1 tablet (20 mg total) by mouth daily as needed. 04/04/13   Ethelda Chick, MD  HYDROcodone-acetaminophen (NORCO) 10-325 MG per tablet Take 1 tablet by mouth 2 (two) times daily as needed. For pain NEED REFILLS 01/01/13   Ethelda Chick, MD  metoprolol tartrate (LOPRESSOR) 25 MG tablet Take 25 mg by mouth 2 (two) times daily. NEED REFILLS 01/01/13   Ethelda Chick, MD  nystatin cream (MYCOSTATIN) Apply topically 2 (two) times daily. 04/04/13    Ethelda Chick, MD  potassium chloride SA (K-DUR,KLOR-CON) 20 MEQ tablet Take 1 tablet (20 mEq total) by mouth daily. 04/04/13   Ethelda Chick, MD  simvastatin (ZOCOR) 40 MG tablet Take 40 mg by mouth at bedtime. NEED REFILLS 01/01/13   Ethelda Chick, MD    Allergies  Allergen Reactions  . Contrast Media (Iodinated Diagnostic Agents) Anaphylaxis  . Nitrofurantoin Monohyd Macro Anaphylaxis  . Iodine Hives  . Red Dye Itching  . Ultram (Tramadol Hcl) Nausea And Vomiting    History   Social History  . Marital Status: Married    Spouse Name: N/A    Number of Children: N/A  . Years of Education: N/A   Occupational History  . Not on file.   Social History Main Topics  . Smoking status: Former Games developer  . Smokeless tobacco: Not on file  . Alcohol Use: No  . Drug Use: No  . Sexually Active: Not on file   Other Topics Concern  . Not on file   Social History Narrative   Marital status: widowed since 2009; dating.      Children: 2 children (35 daughter, 17 son estranged); 2 grandchildren.      Lives: with boyfriend, daughter, granddaughter, brother, friend of daughter.  Lives in pt house.      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      Tobacco:  Smoked x 20 years; quit 20 years.      Alcohol:  Never.      Drugs:  None since college.      Exercise:  Walking several times per week.    Family History  Problem Relation Age of Onset  . Adopted: Yes       Objective:   Physical Exam  Nursing note and vitals reviewed. Constitutional: She is oriented to person, place, and time. She appears well-developed and well-nourished. No distress.  Neck: Neck supple.  Cardiovascular: Normal rate, regular rhythm, normal heart sounds and intact distal pulses.  Exam reveals no gallop and no friction rub.   No murmur heard. Pulmonary/Chest: Effort normal and breath sounds normal.  Abdominal: Soft. Bowel sounds are normal. She  exhibits no distension. There is no tenderness. There is no rebound and no guarding.  Neurological: She is alert and oriented to person, place, and time.  Skin: Skin is warm and dry. She is not diaphoretic.  LLE medial aspect of calf with 1 cm pustular lesion with 5 mm of surrounding erythema; no streaking, fluctuants; mildly TTP.  Psychiatric: She has a normal mood and affect. Her behavior is normal.  Results for orders placed in visit on 02/27/13  POCT CBC      Result Value Range   WBC 6.5  4.6 - 10.2 K/uL   Lymph, poc 1.4  0.6 - 3.4   POC LYMPH PERCENT 22.3  10 - 50 %L   MID (cbc) 0.5  0 - 0.9   POC MID % 8.1  0 - 12 %M   POC Granulocyte 4.5  2 - 6.9   Granulocyte percent 69.6  37 - 80 %G   RBC 3.76 (*) 4.04 - 5.48 M/uL   Hemoglobin 10.3 (*) 12.2 - 16.2 g/dL   HCT, POC 47.8 (*) 29.5 - 47.9 %   MCV 89.6  80 - 97 fL   MCH, POC 27.4  27 - 31.2 pg   MCHC 30.6 (*) 31.8 - 35.4 g/dL   RDW, POC 62.1     Platelet Count, POC 335  142 - 424 K/uL   MPV 7.8  0 - 99.8 fL       Assessment & Plan:  Anemia, unspecified - Plan: POCT CBC  Wound, open, leg, left, initial encounter - Plan: DISCONTINUED: cephALEXin (KEFLEX) 500 MG capsule  Unspecified constipation  Type II or unspecified type diabetes mellitus without mention of complication, not stated as uncontrolled    1.  Anemia: worsening after last visit.  Hemoccult cards negative x 3; no previous colonoscopy due to financial restrictions.  Asymptomatic.  No GI symptoms; cannot tolerate ferrous sulfate due to chronic constipation.  Recommend eating foods rich in iron. 2.  Wound LLE:  New.  Rx for Keflex provided; RTC immediately for increasing redness, pain, or drainage. 3.  Constipation: Chronic; recommend Sen[place]ot-S 1-2 tablets bid; increase water intake; increase fiber intake. 4.  DMII: controlled; warrants aggressive control of wounds, infections.  Meds ordered this encounter  Medications  . DISCONTD: cephALEXin (KEFLEX)  500 MG capsule    Sig: Take 1 capsule (500 mg total) by mouth 3 (three) times daily.    Dispense:  30 capsule    Refill:  0

## 2013-04-02 ENCOUNTER — Ambulatory Visit: Payer: Self-pay | Admitting: Family Medicine

## 2013-04-04 ENCOUNTER — Encounter: Payer: Self-pay | Admitting: Family Medicine

## 2013-04-04 ENCOUNTER — Ambulatory Visit: Payer: Self-pay | Admitting: Family Medicine

## 2013-04-04 VITALS — BP 126/60 | HR 71 | Temp 98.4°F | Resp 16 | Ht 62.0 in | Wt 236.0 lb

## 2013-04-04 DIAGNOSIS — E119 Type 2 diabetes mellitus without complications: Secondary | ICD-10-CM

## 2013-04-04 DIAGNOSIS — D649 Anemia, unspecified: Secondary | ICD-10-CM

## 2013-04-04 DIAGNOSIS — E7800 Pure hypercholesterolemia, unspecified: Secondary | ICD-10-CM

## 2013-04-04 DIAGNOSIS — I10 Essential (primary) hypertension: Secondary | ICD-10-CM

## 2013-04-04 DIAGNOSIS — S81802A Unspecified open wound, left lower leg, initial encounter: Secondary | ICD-10-CM

## 2013-04-04 DIAGNOSIS — B3789 Other sites of candidiasis: Secondary | ICD-10-CM

## 2013-04-04 DIAGNOSIS — I878 Other specified disorders of veins: Secondary | ICD-10-CM

## 2013-04-04 LAB — COMPREHENSIVE METABOLIC PANEL
ALT: 16 U/L (ref 0–35)
AST: 23 U/L (ref 0–37)
Albumin: 3.8 g/dL (ref 3.5–5.2)
Alkaline Phosphatase: 75 U/L (ref 39–117)
Potassium: 5 mEq/L (ref 3.5–5.3)
Sodium: 140 mEq/L (ref 135–145)
Total Bilirubin: 0.8 mg/dL (ref 0.3–1.2)
Total Protein: 6.8 g/dL (ref 6.0–8.3)

## 2013-04-04 LAB — CBC
Hemoglobin: 9.5 g/dL — ABNORMAL LOW (ref 12.0–15.0)
MCH: 28.3 pg (ref 26.0–34.0)
MCHC: 32.6 g/dL (ref 30.0–36.0)
Platelets: 289 10*3/uL (ref 150–400)
RDW: 15 % (ref 11.5–15.5)

## 2013-04-04 LAB — POCT URINALYSIS DIPSTICK
Blood, UA: NEGATIVE
Protein, UA: 30
Spec Grav, UA: 1.015

## 2013-04-04 LAB — HEMOGLOBIN A1C: Mean Plasma Glucose: 108 mg/dL (ref <117)

## 2013-04-04 MED ORDER — CEPHALEXIN 500 MG PO CAPS: 500.0000 mg | ORAL_CAPSULE | Freq: Three times a day (TID) | ORAL | Status: DC

## 2013-04-04 MED ORDER — NYSTATIN 100000 UNIT/GM EX CREA: TOPICAL_CREAM | Freq: Two times a day (BID) | CUTANEOUS | Status: DC

## 2013-04-04 MED ORDER — FUROSEMIDE 20 MG PO TABS: 20.0000 mg | ORAL_TABLET | Freq: Every day | ORAL | Status: DC | PRN

## 2013-04-04 MED ORDER — POTASSIUM CHLORIDE CRYS ER 20 MEQ PO TBCR: 20.0000 meq | EXTENDED_RELEASE_TABLET | Freq: Every day | ORAL | Status: DC

## 2013-04-04 NOTE — Progress Notes (Signed)
[address]   [place], [place]  [address]   [phone]  Subjective:    Patient ID: Angel French, female    DOB: October 05, 1951, 62 y.o.   MRN: [id number]  HPI This 62 y.o. female presents for three month follow-up:  1.  DMII:  Sugars running 75-128. Gained 9 pounds; working hard in past month, has only lost 3 pounds.  Lantus 20-30 units depending on food intake; sometimes skips it.  Uses Lantus 50% of time.  Never takes at night; only takes in a.m.  If going to eat dessert, will take Lantus.  No longer sees Hazle Quant; sees Allena Katz in same office; s/p visit 12/2012.  Yearly follow up.    2.  Venous stasis: chronic recurrent issue for patient; needs diuretic.  Furosemide 20mg  once daily has worked well in the past.  3.  Chronic lower back pain:  Needs refill on hydrodocodone.  Pain is stable; no recent worsening; no change in pain characteristics.  4.  Yeast infection breast:  Requesting Nystatin. This has worked well for patient in the past.  5. Obesity: has gained 9 pounds; trying to lose weight and has only lost three pounds.   B:  Skip; coffee or special K protein shake.  Snack: none or apple or special K cookies.  L:  Sandwich (bread, peanut butter or lunch meat) or left overs, coffee Lowella Grip).  Diet Mt Dew.  Snack:  None Supper: meat, vegetable, rice.  Bedtime: pack of crackers.  Sometimes will eat chocolate bar or cake.  No formal exercise.  Previously weighed 300 pounds.    6.  Constipation:  Much improved since last visit.  Current regimen working very good.  7. HTN: stable; compliance with medication; good tolerance to medication; good symptom control.  8. Anemia:  Due for repeat labs; no bloody stools, melena, n/v/d.   9. L leg wound: hit leg on something; has always been very slow to heal; now area with a bit of redness surrounding it; no drainage. Keflex prescribed at last visit in 01/2013 but area still persistent; improvement since last visit but persistent.   Review of Systems    Constitutional: Positive for unexpected weight change. Negative for fever, chills, diaphoresis, activity change, appetite change and fatigue.  Respiratory: Negative for shortness of breath, wheezing and stridor.   Cardiovascular: Positive for leg swelling. Negative for chest pain and palpitations.  Gastrointestinal: Positive for constipation. Negative for nausea, vomiting, abdominal pain, diarrhea, blood in stool, anal bleeding and rectal pain.  Endocrine: Negative for cold intolerance, heat intolerance, polydipsia, polyphagia and polyuria.  Musculoskeletal: Positive for myalgias and back pain.  Skin: Positive for rash. Negative for color change, pallor and wound.  Neurological: Negative for dizziness, tremors, seizures, syncope, facial asymmetry, speech difficulty, weakness, light-headedness, numbness and headaches.  Psychiatric/Behavioral: Positive for sleep disturbance. Negative for suicidal ideas, self-injury and dysphoric mood. The patient is not nervous/anxious.        Past Medical History  Diagnosis Date  . Diabetes mellitus   . Neuromuscular disorder   . Anxiety   . Hypertension   . Depression   . Allergy     generic allergy pill; Spring and Fall only.  . Arthritis     DDD lumbar, R hip OA.  s/p ortho consult in past.  . Diabetic peripheral neuropathy associated with type 2 diabetes mellitus   . Blood transfusion without reported diagnosis     Mountain climbing accident in [place].  . Cataract  B retractions.  Marland Kitchen Ulcer     Peptic ulcer H. Pylori + s/p treatment.  Upper GI diagnosed.[name] Prilosec PRN .  Marland Kitchen Diabetic retinopathy associated with type 2 diabetes mellitus     s/p laser treatment multiple.  Unable to drive.   Past Surgical History  Procedure Laterality Date  . Cholecystectomy    . Eye surgery      Cataracts B.  . Tonsillectomy    . Abdominal hysterectomy      DUB; ovaries intact.  . Carpal tunnel release      Bilateral.  . Behavioral helath admission       age 25; three months in [place].  . Cardiac catheterization  11/02/2007    normal coronary arteries.   Current Outpatient Prescriptions on File Prior to Visit  Medication Sig Dispense Refill  . aspirin 325 MG tablet Take 325 mg by mouth 2 (two) times daily.        Marland Kitchen etodolac (LODINE) 400 MG tablet Take 400 mg by mouth 2 (two) times daily.        . insulin glargine (LANTUS) 100 UNIT/ML injection Inject 40 Units into the skin 2 (two) times daily. PER PATIENT USE NO THAN 30 UNITS DAILY AS NEEDED      . metFORMIN (GLUCOPHAGE-XR) 500 MG 24 hr tablet Take 1,000 mg by mouth 2 (two) times daily. NEED REFILLS      . temazepam (RESTORIL) 30 MG capsule Take 30 mg by mouth at bedtime as needed. For sleep        No current facility-administered medications on file prior to visit.    Objective:   Physical Exam  Nursing note and vitals reviewed. Constitutional: She is oriented to person, place, and time. She appears well-developed and well-nourished. No distress.  HENT:  Head: Normocephalic and atraumatic.  Mouth/Throat: Oropharynx is clear and moist.  Eyes: Conjunctivae and EOM are normal. Pupils are equal, round, and reactive to light.  Neck: Normal range of motion. Neck supple. No JVD present. No thyromegaly present.  Cardiovascular: Normal rate, regular rhythm, normal heart sounds and intact distal pulses.  Exam reveals no gallop and no friction rub.   No murmur heard. Scant/trace pitting edema BLE.  Pulmonary/Chest: Effort normal and breath sounds normal. She has no wheezes. She has no rales.  Abdominal: Soft. Bowel sounds are normal. She exhibits no distension and no mass. There is no tenderness. There is no rebound and no guarding.  Musculoskeletal:       Lumbar back: She exhibits pain. She exhibits normal range of motion, no tenderness and no spasm.  Lymphadenopathy:    She has no cervical adenopathy.  Neurological: She is alert and oriented to person, place, and time. No cranial  nerve deficit. She exhibits normal muscle tone. Coordination normal.  Skin: Rash noted. She is not diaphoretic.  Diffuse erythematous rash under breast.  Psychiatric: She has a normal mood and affect. Her behavior is normal.          Assessment & Plan:  Type II or unspecified type diabetes mellitus without mention of complication, not stated as uncontrolled - Plan: Comprehensive metabolic panel, Hemoglobin A1c, POCT urinalysis dipstick  Anemia, unspecified - Plan: CBC, Comprehensive metabolic panel  Candidiasis of breast - Plan: nystatin cream (MYCOSTATIN)  Wound, open, leg, left, initial encounter - Plan: cephALEXin (KEFLEX) 500 MG capsule  Essential hypertension, benign  1. DMII: controlled; obtain labs; continue current medication; recommend weight loss, exercise. 2.  Anemia: persistent; repeat  labs today. 3.  Candidiasis Breast:  New/recurrent; rx for Nystatin cream to use bid. 4.  Wound open leg subsequent visit L:  Persistent but improved.  Refill of Keflex provided.  RTC for persistence or lack of healing. 5.  HTN: controlled; no change in management.  Obtain labs. 6. Venous stasis: recurrent issue; rx for Lasix provided; KCL rx also provided. 7. Hypercholesterolemia: stable; refill of Zocor provided.  Meds ordered this encounter  Medications  . simvastatin (ZOCOR) 40 MG tablet    Sig: Take 40 mg by mouth at bedtime. NEED REFILLS  . metoprolol tartrate (LOPRESSOR) 25 MG tablet    Sig: Take 25 mg by mouth 2 (two) times daily. NEED REFILLS  . FLUoxetine (PROZAC) 20 MG capsule    Sig: Take 60 mg by mouth daily. NEED REFILLS  . HYDROcodone-acetaminophen (NORCO) 10-325 MG per tablet    Sig: Take 1 tablet by mouth 2 (two) times daily as needed. For pain NEED REFILLS  . nystatin cream (MYCOSTATIN)    Sig: Apply topically 2 (two) times daily.    Dispense:  30 g    Refill:  5  . cephALEXin (KEFLEX) 500 MG capsule    Sig: Take 1 capsule (500 mg total) by mouth 3 (three) times  daily.    Dispense:  30 capsule    Refill:  0  . furosemide (LASIX) 20 MG tablet    Sig: Take 1 tablet (20 mg total) by mouth daily as needed.    Dispense:  30 tablet    Refill:  5  . potassium chloride SA (K-DUR,KLOR-CON) 20 MEQ tablet    Sig: Take 1 tablet (20 mEq total) by mouth daily.    Dispense:  30 tablet    Refill:  5

## 2013-04-04 NOTE — Patient Instructions (Signed)
1. SOAK THUMB IN WARM WATER WITH PEROXIDE TWICE DAILY; RETURN FOR WORSENING REDNESS, SWELLING.

## 2013-05-20 ENCOUNTER — Ambulatory Visit: Payer: Self-pay | Admitting: Family Medicine

## 2013-05-20 VITALS — BP 110/60 | HR 61 | Temp 97.8°F | Resp 16 | Ht 62.5 in | Wt 228.2 lb

## 2013-05-20 DIAGNOSIS — G4700 Insomnia, unspecified: Secondary | ICD-10-CM

## 2013-05-20 DIAGNOSIS — F430 Acute stress reaction: Secondary | ICD-10-CM

## 2013-05-20 DIAGNOSIS — R42 Dizziness and giddiness: Secondary | ICD-10-CM

## 2013-05-20 DIAGNOSIS — R413 Other amnesia: Secondary | ICD-10-CM

## 2013-05-20 DIAGNOSIS — E119 Type 2 diabetes mellitus without complications: Secondary | ICD-10-CM

## 2013-05-20 LAB — COMPREHENSIVE METABOLIC PANEL
BUN: 35 mg/dL — ABNORMAL HIGH (ref 6–23)
CO2: 25 mEq/L (ref 19–32)
Calcium: 9.1 mg/dL (ref 8.4–10.5)
Chloride: 105 mEq/L (ref 96–112)
Creat: 1.66 mg/dL — ABNORMAL HIGH (ref 0.50–1.10)

## 2013-05-20 LAB — VITAMIN B12: Vitamin B-12: 417 pg/mL (ref 211–911)

## 2013-05-20 LAB — CBC WITH DIFFERENTIAL/PLATELET
Basophils Absolute: 0 10*3/uL (ref 0.0–0.1)
Eosinophils Relative: 9 % — ABNORMAL HIGH (ref 0–5)
HCT: 30.7 % — ABNORMAL LOW (ref 36.0–46.0)
Lymphocytes Relative: 24 % (ref 12–46)
Lymphs Abs: 1.7 10*3/uL (ref 0.7–4.0)
MCV: 87.2 fL (ref 78.0–100.0)
Monocytes Absolute: 0.5 10*3/uL (ref 0.1–1.0)
RDW: 15.3 % (ref 11.5–15.5)
WBC: 7 10*3/uL (ref 4.0–10.5)

## 2013-05-20 MED ORDER — HYDROCODONE-ACETAMINOPHEN 10-325 MG PO TABS: 1.0000 | ORAL_TABLET | Freq: Two times a day (BID) | ORAL | Status: DC | PRN

## 2013-05-20 MED ORDER — TEMAZEPAM 15 MG PO CAPS: 15.0000 mg | ORAL_CAPSULE | Freq: Every evening | ORAL | Status: DC | PRN

## 2013-05-20 MED ORDER — TRAZODONE HCL 50 MG PO TABS: 50.0000 mg | ORAL_TABLET | Freq: Every day | ORAL | Status: DC

## 2013-05-20 NOTE — Progress Notes (Signed)
[address]   [place], [place]  [address]   [phone]  Subjective:    Patient ID: Angel French, female    DOB: [birth year], 62 y.o.   MRN: [id number]  HPI This 62 y.o. female presents for evaluation of the following:  1. Insomnia:  For past month, not sleeping.  Has been on Temazepam for 10-15 years; has been increased x 2.  Now having memory issues, dizziness.  Then started having major memory issues.  Ran into old friend at [employer]; did not recall who person was at all until she mentioned son.  Has had four other episodes where she did not recognize four other people.  Not daily but becoming more forgetful.  Under a lot of stress for past month; found out about the wedding on July 1; Jackson's wedding; sleeping 3 hours maxium.  Usually sleep 6-7 hours per night at baseline.  Really getting scared.  Watch movies, read.   Only new medication is Vitamin D; does not take very often; taking every other day.  No cessation of medication.  Does drink caffeine all day long; has not had a lot of coffee due to heat.  Drinks diet white soda which is caffeine free. Rare tea.  Nephew is getting married.  His mother is thin and beautiful.  Wedding is 06/02/13.  Late husband's brother.  Did not attend daughter's husband's funeral; major tension in family.  Really bothering her.  Boyfriend cannot drive; not going to be able to drive.  Boyfriend cannot drive.L Started adding Vicodin 5mg  qhs; also started taking ASA at night.  Has been hurting more; no improvement.   Has been hurting more than normal; needs refill on hydrocodone.  2. Dizziness:  Has been dizzy sporadically; can occur at any time.  Can occur at sitting, standing, laying down.  Occurs sporadically.  No change in medications in past month; sugars have been running slightly higher for the past month.    3. DMII:  Sugars running higher. Feels secondary to poor sleep.    4. Chronic lower back pain: needs refill on Hydrocodone; taking one every  morning; 1.5 tablets in evening.  Review of Systems  Constitutional: Positive for fatigue. Negative for fever, chills and diaphoresis.  Respiratory: Negative for shortness of breath.   Cardiovascular: Negative for chest pain and palpitations.  Endocrine: Negative for cold intolerance, heat intolerance, polydipsia, polyphagia and polyuria.  Skin: Positive for color change.  Neurological: Positive for dizziness and light-headedness. Negative for tremors, seizures, syncope, facial asymmetry, speech difficulty, weakness, numbness and headaches.  Psychiatric/Behavioral: Positive for sleep disturbance and dysphoric mood. Negative for suicidal ideas, behavioral problems, confusion, self-injury and agitation. The patient is nervous/anxious.     Past Medical History  Diagnosis Date  . Diabetes mellitus   . Neuromuscular disorder   . Anxiety   . Hypertension   . Depression   . Allergy     generic allergy pill; Spring and Fall only.  . Arthritis     DDD lumbar, R hip OA.  s/p ortho consult in past.  . Diabetic peripheral neuropathy associated with type 2 diabetes mellitus   . Blood transfusion without reported diagnosis     Mountain climbing accident in [place].  . Cataract     B retractions.  Marland Kitchen Ulcer     Peptic ulcer H. Pylori + s/p treatment.  Upper GI diagnosed.[name] Prilosec PRN .  Marland Kitchen Diabetic retinopathy associated with type 2 diabetes mellitus  s/p laser treatment multiple.  Unable to drive.    Past Surgical History  Procedure Laterality Date  . Cholecystectomy    . Eye surgery      Cataracts B.  . Tonsillectomy    . Abdominal hysterectomy      DUB; ovaries intact.  . Carpal tunnel release      Bilateral.  . Behavioral helath admission      age 45; three months in [place].  . Cardiac catheterization  11/02/2007    normal coronary arteries.    Prior to Admission medications   Medication Sig Start Date End Date Taking? Authorizing Provider  aspirin 325 MG tablet  Take 325 mg by mouth 2 (two) times daily.      Historical Provider, MD  cephALEXin (KEFLEX) 500 MG capsule Take 1 capsule (500 mg total) by mouth 3 (three) times daily. 04/04/13   Ethelda Chick, MD  etodolac (LODINE) 400 MG tablet Take 400 mg by mouth 2 (two) times daily.      Historical Provider, MD  FLUoxetine (PROZAC) 20 MG capsule Take 60 mg by mouth daily. NEED REFILLS 01/01/13   Ethelda Chick, MD  furosemide (LASIX) 20 MG tablet Take 1 tablet (20 mg total) by mouth daily as needed. 04/04/13   Ethelda Chick, MD  HYDROcodone-acetaminophen (NORCO) 10-325 MG per tablet Take 1 tablet by mouth 2 (two) times daily as needed. For pain NEED REFILLS 05/20/13   Ethelda Chick, MD  insulin glargine (LANTUS) 100 UNIT/ML injection Inject 40 Units into the skin 2 (two) times daily. PER PATIENT USE NO THAN 30 UNITS DAILY AS NEEDED    Historical Provider, MD  metFORMIN (GLUCOPHAGE-XR) 500 MG 24 hr tablet Take 1,000 mg by mouth 2 (two) times daily. NEED REFILLS    Historical Provider, MD  metoprolol tartrate (LOPRESSOR) 25 MG tablet Take 25 mg by mouth 2 (two) times daily. NEED REFILLS 01/01/13   Ethelda Chick, MD  nystatin cream (MYCOSTATIN) Apply topically 2 (two) times daily. 04/04/13   Ethelda Chick, MD  potassium chloride SA (K-DUR,KLOR-CON) 20 MEQ tablet Take 1 tablet (20 mEq total) by mouth daily. 04/04/13   Ethelda Chick, MD  simvastatin (ZOCOR) 40 MG tablet Take 40 mg by mouth at bedtime. NEED REFILLS 01/01/13   Ethelda Chick, MD  temazepam (RESTORIL) 15 MG capsule Take 1 capsule (15 mg total) by mouth at bedtime as needed for sleep. For sleep 05/20/13   Ethelda Chick, MD  traZODone (DESYREL) 50 MG tablet Take 1-2 tablets (50-100 mg total) by mouth at bedtime. 05/20/13   Ethelda Chick, MD    Allergies  Allergen Reactions  . Contrast Media (Iodinated Diagnostic Agents) Anaphylaxis  . Nitrofurantoin Monohyd Macro Anaphylaxis  . Iodine Hives  . Red Dye Itching  . Ultram (Tramadol Hcl) Nausea And Vomiting     History   Social History  . Marital Status: Married    Spouse Name: N/A    Number of Children: N/A  . Years of Education: N/A   Occupational History  . Not on file.   Social History Main Topics  . Smoking status: Former Games developer  . Smokeless tobacco: Not on file  . Alcohol Use: No  . Drug Use: No  . Sexually Active: Not on file   Other Topics Concern  . Not on file   Social History Narrative   Marital status: widowed since 2009; dating.      Children: 2 children (35  daughter, 5 son estranged); 2 grandchildren.      Lives: with boyfriend, daughter, granddaughter, brother, friend of daughter.  Lives in pt house.      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      Tobacco:  Smoked x 20 years; quit 20 years.      Alcohol:  Never.      Drugs:  None since college.      Exercise:  Walking several times per week.    Family History  Problem Relation Age of Onset  . Adopted: Yes       Objective:   Physical Exam  Nursing note and vitals reviewed. Constitutional: She is oriented to person, place, and time. She appears well-developed and well-nourished. No distress.  HENT:  Head: Normocephalic and atraumatic.  Right Ear: External ear normal.  Left Ear: External ear normal.  Eyes: Conjunctivae and EOM are normal. Pupils are equal, round, and reactive to light.  Neck: Normal range of motion. Neck supple. No thyromegaly present.  Cardiovascular: Normal rate, regular rhythm and normal heart sounds.  Exam reveals no gallop and no friction rub.   No murmur heard. Pulmonary/Chest: Effort normal and breath sounds normal. She has no wheezes. She has no rales.  Lymphadenopathy:    She has no cervical adenopathy.  Neurological: She is alert and oriented to person, place, and time. No cranial nerve deficit. She exhibits normal muscle tone. Coordination normal.  Skin: Skin is warm and dry. No rash noted. She is not  diaphoretic.  Psychiatric: She has a normal mood and affect. Her behavior is normal. Judgment and thought content normal.       Assessment & Plan:  Insomnia  Stress reaction  Memory loss - Plan: Vitamin B12, TSH  Dizziness and giddiness - Plan: CBC with Differential, Comprehensive metabolic panel, TSH  Type II or unspecified type diabetes mellitus without mention of complication, not stated as uncontrolled   1.  Insomnia: worsening/uncontrolled; decrease Temazepam to 15mg  qhs and plan to wean to off in upcoming month; add Trazodone 50mg  one po qhs with Temazepam 15mg  qhs; in 1-2 weeks, increase Trazodone 50mg  two at bedtime and d/c Temazepam. 2.  Stress reaction:  New.  Likely etiology to worsening insomnia.  Counseling provided. 3.  Memory Loss:  New.  Onset with worsening insomnia.  Obtain labs.  Control insomnia and reassess memory loss.  If persists after improved insomnia, will warrant formal MMSE, neurology referral, CT head. 4.  Dizziness: New.  Associated with memory loss, insomnia, stress reaction.  Obtain labs with diabetes, anemia.   5.  DMII: worsening; stable sugars; monitor closely.  Likely secondary to stress reaction and insomnia. 6  Chronic lower back pain: worsening; refill of Hydrocodone provided.  Meds ordered this encounter  Medications  . traZODone (DESYREL) 50 MG tablet    Sig: Take 1-2 tablets (50-100 mg total) by mouth at bedtime.    Dispense:  60 tablet    Refill:  5  . temazepam (RESTORIL) 15 MG capsule    Sig: Take 1 capsule (15 mg total) by mouth at bedtime as needed for sleep. For sleep    Dispense:  30 capsule    Refill:  0  . HYDROcodone-acetaminophen (NORCO) 10-325 MG per tablet    Sig: Take 1 tablet by mouth 2 (two) times daily as needed. For pain NEED REFILLS    Dispense:  75 tablet    Refill:  5

## 2013-05-20 NOTE — Patient Instructions (Addendum)
1. Decrease Temazepam to 15mg  one at bedtime; also start Trazodone 50mg  one at bedtime; take this regimen for the next week; email me in one week with update on how you are sleeping.

## 2013-05-25 ENCOUNTER — Encounter: Payer: Self-pay | Admitting: Family Medicine

## 2013-05-26 ENCOUNTER — Encounter: Payer: Self-pay | Admitting: Family Medicine

## 2013-06-09 ENCOUNTER — Other Ambulatory Visit: Payer: Self-pay | Admitting: Family Medicine

## 2013-06-13 ENCOUNTER — Other Ambulatory Visit: Payer: Self-pay | Admitting: Family Medicine

## 2013-07-05 ENCOUNTER — Ambulatory Visit: Payer: Self-pay

## 2013-07-05 ENCOUNTER — Ambulatory Visit: Payer: Self-pay | Admitting: Family Medicine

## 2013-07-05 VITALS — BP 124/62 | HR 64 | Temp 98.9°F | Resp 18 | Wt 218.0 lb

## 2013-07-05 DIAGNOSIS — M545 Low back pain, unspecified: Secondary | ICD-10-CM

## 2013-07-05 DIAGNOSIS — B3789 Other sites of candidiasis: Secondary | ICD-10-CM

## 2013-07-05 DIAGNOSIS — M25559 Pain in unspecified hip: Secondary | ICD-10-CM

## 2013-07-05 DIAGNOSIS — M25552 Pain in left hip: Secondary | ICD-10-CM

## 2013-07-05 DIAGNOSIS — E119 Type 2 diabetes mellitus without complications: Secondary | ICD-10-CM

## 2013-07-05 DIAGNOSIS — E7800 Pure hypercholesterolemia, unspecified: Secondary | ICD-10-CM

## 2013-07-05 LAB — POCT CBC
HCT, POC: 33.8 % — AB (ref 37.7–47.9)
Hemoglobin: 10.5 g/dL — AB (ref 12.2–16.2)
Lymph, poc: 2.4 (ref 0.6–3.4)
MCH, POC: 28.5 pg (ref 27–31.2)
MCHC: 31.1 g/dL — AB (ref 31.8–35.4)
WBC: 8 10*3/uL (ref 4.6–10.2)

## 2013-07-05 LAB — POCT GLYCOSYLATED HEMOGLOBIN (HGB A1C): Hemoglobin A1C: 5.1

## 2013-07-05 MED ORDER — SIMVASTATIN 40 MG PO TABS: 40.0000 mg | ORAL_TABLET | Freq: Every day | ORAL | Status: DC

## 2013-07-05 MED ORDER — PREDNISONE 20 MG PO TABS: ORAL_TABLET | ORAL | Status: DC

## 2013-07-05 MED ORDER — TRAZODONE HCL 100 MG PO TABS: 100.0000 mg | ORAL_TABLET | Freq: Every day | ORAL | Status: DC

## 2013-07-05 MED ORDER — MELOXICAM 15 MG PO TABS: 15.0000 mg | ORAL_TABLET | Freq: Every day | ORAL | Status: DC

## 2013-07-05 NOTE — Progress Notes (Signed)
[address]   [place], [place]  [address]   [phone]  Subjective:    Patient ID: Angel French, female    DOB: 1951-10-17, 62 y.o.   MRN: [id number]  HPI This 62 y.o. female presents for follow-up of the following:   1. DMII: stopped insulin/Lantus; weight down ten pounds.  Sugars running well.  Working really hard on weight loss.    2.  L hip pain:  Diagnosed with DDD lumbar and R leg; L hip really hurts in groin and outer aspect of hip.  Also getting terrible pains down leg; aching.  Radiates to L lateral ankle.  No foot pain.  Entire lateral aspect of L leg; burning along thigh.  Had added on extra dose of Vicodin per day.   Cannot lay on L hip; unable to lay on R hip; sleeping on back.  Falling because of leg giving out.  3.  Epigastric burning: with nausea; history of PUD since age 89.  Prescribed antibiotics at age 37.  Has intermittent flares.  Takign Etodolac bid.  Denies n/v/d/c; denies melena or bloody stools.   4. Breast rash: recurrent issue.  5. Hyperlipidemia:  Compliance with medication; good tolerance to medication; good symptom control.  6.  Insomnia: well controlled with switch to Trazodone; taking two 50mg  Trazodone qhs with good sleep.  Needs refill.  7. Anemia: due for repeat labs.  Denies bloody stools or melena.  Review of Systems  Respiratory: Negative for cough, shortness of breath, wheezing and stridor.   Cardiovascular: Positive for leg swelling. Negative for chest pain and palpitations.  Gastrointestinal: Positive for abdominal pain. Negative for nausea, vomiting, diarrhea, constipation, blood in stool, abdominal distention, anal bleeding and rectal pain.  Endocrine: Negative for cold intolerance, heat intolerance, polydipsia, polyphagia and polyuria.  Musculoskeletal: Positive for arthralgias, back pain, gait problem and myalgias. Negative for joint swelling, neck pain and neck stiffness.  Neurological: Positive for weakness and numbness. Negative for  dizziness, tremors, seizures, syncope, facial asymmetry, speech difficulty, light-headedness and headaches.   Past Medical History  Diagnosis Date  . Diabetes mellitus   . Neuromuscular disorder   . Anxiety   . Hypertension   . Depression   . Allergy     generic allergy pill; Spring and Fall only.  . Arthritis     DDD lumbar, R hip OA.  s/p ortho consult in past.  . Diabetic peripheral neuropathy associated with type 2 diabetes mellitus   . Blood transfusion without reported diagnosis     Mountain climbing accident in [place].  . Cataract     B retractions.  Marland Kitchen Ulcer     Peptic ulcer H. Pylori + s/p treatment.  Upper GI diagnosed.[name] Prilosec PRN .  Marland Kitchen Diabetic retinopathy associated with type 2 diabetes mellitus     s/p laser treatment multiple.  Unable to drive.   Past Surgical History  Procedure Laterality Date  . Cholecystectomy    . Eye surgery      Cataracts B.  . Tonsillectomy    . Abdominal hysterectomy      DUB; ovaries intact.  . Carpal tunnel release      Bilateral.  . Behavioral helath admission      age 12; three months in [place].  . Cardiac catheterization  11/02/2007    normal coronary arteries.   Allergies  Allergen Reactions  . Contrast Media [Iodinated Diagnostic Agents] Anaphylaxis  . Nitrofurantoin Monohyd Macro Anaphylaxis  . Iodine Hives  .  Red Dye Itching  . Ultram [Tramadol Hcl] Nausea And Vomiting   Current Outpatient Prescriptions on File Prior to Visit  Medication Sig Dispense Refill  . aspirin 325 MG tablet Take 325 mg by mouth 2 (two) times daily.        Marland Kitchen etodolac (LODINE) 300 MG capsule take 1 tablet by mouth three times a day  90 capsule  4  . etodolac (LODINE) 400 MG tablet Take 400 mg by mouth 2 (two) times daily.        . furosemide (LASIX) 20 MG tablet Take 1 tablet (20 mg total) by mouth daily as needed.  30 tablet  5  . metoprolol tartrate (LOPRESSOR) 25 MG tablet Take 25 mg by mouth 2 (two) times daily. NEED REFILLS        . nystatin cream (MYCOSTATIN) Apply topically 2 (two) times daily.  30 g  5  . potassium chloride SA (K-DUR,KLOR-CON) 20 MEQ tablet Take 1 tablet (20 mEq total) by mouth daily.  30 tablet  5  . cephALEXin (KEFLEX) 500 MG capsule Take 1 capsule (500 mg total) by mouth 3 (three) times daily.  30 capsule  0  . insulin glargine (LANTUS) 100 UNIT/ML injection Inject 40 Units into the skin 2 (two) times daily. PER PATIENT USE NO THAN 30 UNITS DAILY AS NEEDED      . temazepam (RESTORIL) 15 MG capsule Take 1 capsule (15 mg total) by mouth at bedtime as needed for sleep. For sleep  30 capsule  0   No current facility-administered medications on file prior to visit.       Objective:   Physical Exam  Nursing note and vitals reviewed. Constitutional: She is oriented to person, place, and time. She appears well-developed and well-nourished. No distress.  HENT:  Head: Normocephalic and atraumatic.  Mouth/Throat: Oropharynx is clear and moist.  Eyes: Conjunctivae and EOM are normal. Pupils are equal, round, and reactive to light.  Neck: Normal range of motion. Neck supple.  Cardiovascular: Normal rate, regular rhythm and normal heart sounds.   No murmur heard. Pulmonary/Chest: Effort normal and breath sounds normal. She has no wheezes. She has no rales.  Abdominal: Soft. Bowel sounds are normal. She exhibits no distension and no mass. There is tenderness in the epigastric area. There is no rebound and no guarding.  Musculoskeletal:       Left hip: She exhibits decreased range of motion, tenderness and bony tenderness. She exhibits normal strength, no swelling, no crepitus, no deformity and no laceration.       Lumbar back: She exhibits tenderness and pain. She exhibits normal range of motion, no bony tenderness, no swelling and no spasm.  Lumbar spine: full ROM with pain reproduced; straight leg raises negative; motor 5/5; gait slowed. L Hip:  Pain with all directions; motor 5/5; +TTP lateral aspect of  hip at trochanteric region.  Slow with position changes from supine to sitting and from sitting to standing.  Neurological: She is alert and oriented to person, place, and time.  Skin: Skin is warm and dry. Rash noted. She is not diaphoretic. There is erythema.  +erythematous rash under breast.  Psychiatric: She has a normal mood and affect. Her behavior is normal. Judgment and thought content normal.      Results for orders placed in visit on 07/05/13  POCT CBC      Result Value Range   WBC 8.0  4.6 - 10.2 K/uL   Lymph, poc 2.4  0.6 -  3.4   POC LYMPH PERCENT 30.4  10 - 50 %L   MID (cbc) 0.6  0 - 0.9   POC MID % 7.4  0 - 12 %M   POC Granulocyte 5.0  2 - 6.9   Granulocyte percent 62.2  37 - 80 %G   RBC 3.69 (*) 4.04 - 5.48 M/uL   Hemoglobin 10.5 (*) 12.2 - 16.2 g/dL   HCT, POC 16.1 (*) 09.6 - 47.9 %   MCV 91.6  80 - 97 fL   MCH, POC 28.5  27 - 31.2 pg   MCHC 31.1 (*) 31.8 - 35.4 g/dL   RDW, POC 04.5     Platelet Count, POC 326  142 - 424 K/uL   MPV 7.3  0 - 99.8 fL  POCT GLYCOSYLATED HEMOGLOBIN (HGB A1C)      Result Value Range   Hemoglobin A1C 5.1     [facility] reading (PRIMARY) by  Dr. Katrinka Blazing.  LUMBAR SPINE:  DIFFUSE MULTILEVEL SEVERE DEGENERATIVE CHANGES WITH SPURRING.  L HIP: NAD.   Assessment & Plan:  Type II or unspecified type diabetes mellitus without mention of complication, not stated as uncontrolled - Plan: POCT CBC, POCT glycosylated hemoglobin (Hb A1C), Comprehensive metabolic panel  Pure hypercholesterolemia - Plan: Lipid panel  Left hip pain - Plan: DG Hip Complete Left  Low back pain - Plan: DG Lumbar Spine 2-3 Views  Candidiasis of breast   1.  DMII: controlled; agreeable to stopping Lantus with recent weight loss and excellent HgbA1c. 2.  Hyperlipidemia: controlled; obtain labs; continue current medications. 3.  L hip pain/greater trochanteric bursitis:  New.  Rx for Prednisone provided; after completing Prednisone, start Meloxicam; to stop Lodine. 4.  Low  back pain/strain:  New.  Rx for Prednisone provided; to stop Lodine; when completes Prednisone, to start Meloxicam. 5.  Epigastric pain abdominal :  New. With history of PUD; stop Lodine; recommend Prilosec OTC daily and Zantac 150mg  qhs. 6.  Candidiasis Breast: Recurrent; continue topical nystatin. 7. Insomnia: controlled; refill of Trazodone at 100mg  qhs. 8. Anemia: stable.   Meds ordered this encounter  Medications  . predniSONE (DELTASONE) 20 MG tablet    Sig: Three tablets daily x 1 day, then two tablets daily x 5 days then one tablet daily x 5 days    Dispense:  18 tablet    Refill:  0  . meloxicam (MOBIC) 15 MG tablet    Sig: Take 1 tablet (15 mg total) by mouth daily.    Dispense:  30 tablet    Refill:  5  . simvastatin (ZOCOR) 40 MG tablet    Sig: Take 1 tablet (40 mg total) by mouth at bedtime.    Dispense:  30 tablet    Refill:  11  . traZODone (DESYREL) 100 MG tablet    Sig: Take 1 tablet (100 mg total) by mouth at bedtime.    Dispense:  30 tablet    Refill:  5

## 2013-07-06 ENCOUNTER — Telehealth: Payer: Self-pay

## 2013-07-06 LAB — LIPID PANEL
Total CHOL/HDL Ratio: 2.6 Ratio
VLDL: 31 mg/dL (ref 0–40)

## 2013-07-06 LAB — COMPREHENSIVE METABOLIC PANEL
ALT: 16 U/L (ref 0–35)
AST: 22 U/L (ref 0–37)
CO2: 28 mEq/L (ref 19–32)
Calcium: 9.7 mg/dL (ref 8.4–10.5)
Chloride: 98 mEq/L (ref 96–112)
Creat: 3.12 mg/dL — ABNORMAL HIGH (ref 0.50–1.10)
Sodium: 135 mEq/L (ref 135–145)
Total Protein: 8 g/dL (ref 6.0–8.3)

## 2013-07-06 NOTE — Telephone Encounter (Signed)
Cassandra from [facility] on [address] is calling to inform us that pt states that we were supposed to increase her dosage on hydrocodone but they do not have a record of hydrocodone being sent in. [phone]

## 2013-07-07 MED ORDER — HYDROCODONE-ACETAMINOPHEN 10-325 MG PO TABS: 1.0000 | ORAL_TABLET | Freq: Four times a day (QID) | ORAL | Status: DC | PRN

## 2013-07-07 NOTE — Telephone Encounter (Signed)
Dr Katrinka Blazing do you recall this?

## 2013-07-07 NOTE — Telephone Encounter (Signed)
Yes, please call pharmacist; I am increasing her hydrocodone 10mg  to 1 tablet three to four times daily.  I did not send in new rx but I will now.  If you can please call in new sig.

## 2013-07-08 NOTE — Telephone Encounter (Signed)
Called in new RX.

## 2013-07-08 NOTE — Telephone Encounter (Signed)
Called the pharmacy. 

## 2013-07-09 ENCOUNTER — Other Ambulatory Visit: Payer: Self-pay | Admitting: Physician Assistant

## 2013-07-16 ENCOUNTER — Ambulatory Visit: Payer: Self-pay | Admitting: Family Medicine

## 2013-07-16 ENCOUNTER — Encounter: Payer: Self-pay | Admitting: Family Medicine

## 2013-07-16 VITALS — BP 112/60 | HR 57 | Temp 98.1°F | Resp 16 | Ht 62.0 in | Wt 218.0 lb

## 2013-07-16 DIAGNOSIS — M25559 Pain in unspecified hip: Secondary | ICD-10-CM

## 2013-07-16 DIAGNOSIS — Z23 Encounter for immunization: Secondary | ICD-10-CM

## 2013-07-16 DIAGNOSIS — E11319 Type 2 diabetes mellitus with unspecified diabetic retinopathy without macular edema: Secondary | ICD-10-CM

## 2013-07-16 DIAGNOSIS — M545 Low back pain: Secondary | ICD-10-CM

## 2013-07-16 DIAGNOSIS — M25551 Pain in right hip: Secondary | ICD-10-CM

## 2013-07-16 DIAGNOSIS — N289 Disorder of kidney and ureter, unspecified: Secondary | ICD-10-CM

## 2013-07-16 DIAGNOSIS — M5136 Other intervertebral disc degeneration, lumbar region: Secondary | ICD-10-CM

## 2013-07-16 LAB — BASIC METABOLIC PANEL
BUN: 37 mg/dL — ABNORMAL HIGH (ref 6–23)
CO2: 29 mEq/L (ref 19–32)
Calcium: 9.3 mg/dL (ref 8.4–10.5)
Creat: 1.44 mg/dL — ABNORMAL HIGH (ref 0.50–1.10)
Glucose, Bld: 263 mg/dL — ABNORMAL HIGH (ref 70–99)

## 2013-07-16 LAB — POCT URINALYSIS DIPSTICK
Glucose, UA: NEGATIVE
Nitrite, UA: NEGATIVE
Protein, UA: 30
Urobilinogen, UA: 0.2

## 2013-07-16 LAB — POCT UA - MICROSCOPIC ONLY
Casts, Ur, LPF, POC: NEGATIVE
Crystals, Ur, HPF, POC: NEGATIVE
Yeast, UA: NEGATIVE

## 2013-07-16 NOTE — Progress Notes (Signed)
Subjective:    Patient ID: Angel French, female    DOB: 1951/06/13, 62 y.o.   MRN: [id number]  HPI This 62 y.o. female presents for ten day follow-up:     1.  B hip pain:  Pain is much better; able to roll over in bed without grunting as much.  In mornings, very stiff; as day progresses pain improves.  Completed Prednisone yesterday; to start Meloxicam today.  Felling much better.  Did fall last week and landed onto knees.    2. Flu vaccine: requesting.  3.  Diabetic retinopathy: no longer drives. Requesting paperwork be completed for SCAT.  Cannot walk to bus stop due to DDD lumbar spine; suffers with significant back pain if walks from house in [place] to bus stop which is several yards from house.    4.  Renal insufficiency/failure: presenting for repeat labs due to Creatinine of 3.1 ten days ago.  Feeling well; no swelling, nausea, SOB, fatigue or malaise.  Has been dieting lately; drinks lots of fluids throughout the day.    Review of Systems  Constitutional: Negative for fever, chills, diaphoresis and fatigue.  Respiratory: Negative for shortness of breath.   Cardiovascular: Negative for chest pain and leg swelling.  Gastrointestinal: Negative for nausea.  Musculoskeletal: Positive for myalgias, back pain, arthralgias and gait problem. Negative for joint swelling.  Neurological: Negative for weakness.   Past Medical History  Diagnosis Date  . Diabetes mellitus   . Neuromuscular disorder   . Anxiety   . Hypertension   . Depression   . Allergy     generic allergy pill; Spring and Fall only.  . Arthritis     DDD lumbar, R hip OA.  s/p ortho consult in past.  . Diabetic peripheral neuropathy associated with type 2 diabetes mellitus   . Blood transfusion without reported diagnosis     Mountain climbing accident in [place].  . Cataract     B retractions.  Marland Kitchen Ulcer     Peptic ulcer H. Pylori + s/p treatment.  Upper GI diagnosed.[name] Prilosec PRN .  Marland Kitchen Diabetic retinopathy  associated with type 2 diabetes mellitus     s/p laser treatment multiple.  Unable to drive.   Past Surgical History  Procedure Laterality Date  . Cholecystectomy    . Eye surgery      Cataracts B.  . Tonsillectomy    . Abdominal hysterectomy      DUB; ovaries intact.  . Carpal tunnel release      Bilateral.  . Behavioral helath admission      age 47; three months in [place].  . Cardiac catheterization  11/02/2007    normal coronary arteries.   Allergies  Allergen Reactions  . Contrast Media [Iodinated Diagnostic Agents] Anaphylaxis  . Nitrofurantoin Monohyd Macro Anaphylaxis  . Iodine Hives  . Red Dye Itching  . Ultram [Tramadol Hcl] Nausea And Vomiting   Current Outpatient Prescriptions on File Prior to Visit  Medication Sig Dispense Refill  . aspirin 325 MG tablet Take 325 mg by mouth 2 (two) times daily.        Marland Kitchen FLUoxetine (PROZAC) 20 MG capsule Take 60 mg by mouth daily. NEED REFILLS      . HYDROcodone-acetaminophen (NORCO) 10-325 MG per tablet Take 1 tablet by mouth every 6 (six) hours as needed. For pain NEED REFILLS  90 tablet  5  . insulin glargine (LANTUS) 100 UNIT/ML injection Inject 40 Units into the skin 2 (two)  times daily. PER PATIENT USE NO THAN 30 UNITS DAILY AS NEEDED      . metFORMIN (GLUCOPHAGE-XR) 500 MG 24 hr tablet take 2 tablets by mouth twice a day  120 tablet  1  . metoprolol tartrate (LOPRESSOR) 25 MG tablet Take 25 mg by mouth 2 (two) times daily. NEED REFILLS      . nystatin cream (MYCOSTATIN) Apply topically 2 (two) times daily.  30 g  5  . potassium chloride SA (K-DUR,KLOR-CON) 20 MEQ tablet Take 1 tablet (20 mEq total) by mouth daily.  30 tablet  5  . simvastatin (ZOCOR) 40 MG tablet Take 1 tablet (40 mg total) by mouth at bedtime.  30 tablet  11  . traZODone (DESYREL) 100 MG tablet Take 1 tablet (100 mg total) by mouth at bedtime.  30 tablet  5  . cephALEXin (KEFLEX) 500 MG capsule Take 1 capsule (500 mg total) by mouth 3 (three) times daily.   30 capsule  0  . etodolac (LODINE) 300 MG capsule take 1 tablet by mouth three times a day  90 capsule  4  . etodolac (LODINE) 400 MG tablet Take 400 mg by mouth 2 (two) times daily.        . furosemide (LASIX) 20 MG tablet Take 1 tablet (20 mg total) by mouth daily as needed.  30 tablet  5  . meloxicam (MOBIC) 15 MG tablet Take 1 tablet (15 mg total) by mouth daily.  30 tablet  5  . predniSONE (DELTASONE) 20 MG tablet Three tablets daily x 1 day, then two tablets daily x 5 days then one tablet daily x 5 days  18 tablet  0  . temazepam (RESTORIL) 15 MG capsule Take 1 capsule (15 mg total) by mouth at bedtime as needed for sleep. For sleep  30 capsule  0   No current facility-administered medications on file prior to visit.       Objective:   Physical Exam  Nursing note and vitals reviewed. Constitutional: She is oriented to person, place, and time. She appears well-developed and well-nourished. No distress.  HENT:  Head: Normocephalic and atraumatic.  Eyes: Conjunctivae are normal. Pupils are equal, round, and reactive to light.  Neck: Normal range of motion. Neck supple.  Cardiovascular: Normal rate, regular rhythm and normal heart sounds.   Pulmonary/Chest: Effort normal and breath sounds normal.  Neurological: She is alert and oriented to person, place, and time.  Skin: She is not diaphoretic.  Healing superficial eschar L knee> R knee.   Psychiatric: She has a normal mood and affect. Her behavior is normal.   INFLUENZA VACCINE ADMINISTERED IN OFFICE.  Results for orders placed in visit on 07/16/13  POCT UA - MICROSCOPIC ONLY      Result Value Range   WBC, Ur, HPF, POC 0-6     RBC, urine, microscopic 0-4     Bacteria, U Microscopic small     Mucus, UA trace     Epithelial cells, urine per micros 2-18     Crystals, Ur, HPF, POC neg     Casts, Ur, LPF, POC neg     Yeast, UA neg    POCT URINALYSIS DIPSTICK      Result Value Range   Color, UA yellow     Clarity, UA clear      Glucose, UA neg     Bilirubin, UA small     Ketones, UA trace     Spec Grav, UA 1.025  Blood, UA neg     pH, UA 5.5     Protein, UA 30     Urobilinogen, UA 0.2     Nitrite, UA neg     Leukocytes, UA Trace         Assessment & Plan:  Renal insufficiency - Plan: Basic metabolic panel, Flu Vaccine QUAD 36+ mos IM, POCT UA - Microscopic Only, POCT urinalysis dipstick  Need for prophylactic vaccination and inoculation against influenza - Plan: Flu Vaccine QUAD 36+ mos IM   1.  Renal insufficiency:  New/worsening; asymptomatic; repeat labs today; obtain u/a.  If persistent, refer to nephrology and obtain renal u/s. 2.  S/p flu vaccine. 3.  B hip pain: improving s/p Prednisone taper.  To start Meloxicam today if renal function improved.   4. DDD lumbar:  Persistent; completed SCAT paperwork; unable to ambulate long distances due to chronic daily lower back pain. 5.  Diabetic retinopathy: persistent; unable to drive.

## 2013-07-19 ENCOUNTER — Encounter: Payer: Self-pay | Admitting: Family Medicine

## 2013-08-01 ENCOUNTER — Other Ambulatory Visit: Payer: Self-pay

## 2013-08-01 MED ORDER — FLUOXETINE HCL 20 MG PO CAPS: 60.0000 mg | ORAL_CAPSULE | Freq: Every day | ORAL | Status: DC

## 2013-08-13 ENCOUNTER — Telehealth: Payer: Self-pay

## 2013-08-13 MED ORDER — HYDROCODONE-ACETAMINOPHEN 10-325 MG PO TABS: 1.0000 | ORAL_TABLET | Freq: Four times a day (QID) | ORAL | Status: DC | PRN

## 2013-08-13 NOTE — Telephone Encounter (Signed)
Please advise, you gave refills, but this cancelled On Oct 6th, with new guidelines, pended for signature. Set to print.

## 2013-08-13 NOTE — Telephone Encounter (Signed)
Pt of Dr.Smith and is needing rx refill for vicodin. Call at [phone].

## 2013-08-13 NOTE — Telephone Encounter (Signed)
I am out of the office for the next two days; can you please have another provider sign rx for me; she was due for refill on 08/06/13.

## 2013-08-13 NOTE — Telephone Encounter (Signed)
Rx printed.  Meds ordered this encounter  Medications  . HYDROcodone-acetaminophen (NORCO) 10-325 MG per tablet    Sig: Take 1 tablet by mouth every 6 (six) hours as needed.    Dispense:  90 tablet    Refill:  0

## 2013-08-13 NOTE — Telephone Encounter (Signed)
Patient notified and voiced understanding.

## 2013-09-24 ENCOUNTER — Telehealth: Payer: Self-pay

## 2013-09-24 NOTE — Telephone Encounter (Signed)
Patient is calling for a refill on her Vicodin if possible please call her at [phone]  If

## 2013-09-24 NOTE — Telephone Encounter (Signed)
Please advise 

## 2013-09-25 MED ORDER — HYDROCODONE-ACETAMINOPHEN 10-325 MG PO TABS: 1.0000 | ORAL_TABLET | Freq: Four times a day (QID) | ORAL | Status: DC | PRN

## 2013-09-25 NOTE — Telephone Encounter (Signed)
Called to advise.  

## 2013-09-25 NOTE — Telephone Encounter (Signed)
Call -- hydrocodone rx ready for pick up. 

## 2013-09-26 ENCOUNTER — Other Ambulatory Visit: Payer: Self-pay | Admitting: Physician Assistant

## 2013-10-04 ENCOUNTER — Other Ambulatory Visit (HOSPITAL_COMMUNITY): Payer: Self-pay | Admitting: Internal Medicine

## 2013-10-09 ENCOUNTER — Other Ambulatory Visit (HOSPITAL_COMMUNITY): Payer: Self-pay | Admitting: Internal Medicine

## 2013-10-15 ENCOUNTER — Ambulatory Visit: Payer: Self-pay | Admitting: Family Medicine

## 2013-10-15 ENCOUNTER — Encounter: Payer: Self-pay | Admitting: Family Medicine

## 2013-10-15 VITALS — BP 130/62 | HR 70 | Temp 98.0°F | Resp 16 | Ht 62.0 in | Wt 224.8 lb

## 2013-10-15 DIAGNOSIS — E7800 Pure hypercholesterolemia, unspecified: Secondary | ICD-10-CM

## 2013-10-15 DIAGNOSIS — I10 Essential (primary) hypertension: Secondary | ICD-10-CM

## 2013-10-15 DIAGNOSIS — E119 Type 2 diabetes mellitus without complications: Secondary | ICD-10-CM

## 2013-10-15 DIAGNOSIS — E113599 Type 2 diabetes mellitus with proliferative diabetic retinopathy without macular edema, unspecified eye: Secondary | ICD-10-CM

## 2013-10-15 DIAGNOSIS — F329 Major depressive disorder, single episode, unspecified: Secondary | ICD-10-CM

## 2013-10-15 DIAGNOSIS — M5136 Other intervertebral disc degeneration, lumbar region: Secondary | ICD-10-CM

## 2013-10-15 LAB — CBC WITH DIFFERENTIAL/PLATELET
Basophils Absolute: 0 10*3/uL (ref 0.0–0.1)
Hemoglobin: 10.1 g/dL — ABNORMAL LOW (ref 12.0–15.0)
Lymphocytes Relative: 24 % (ref 12–46)
Lymphs Abs: 1.5 10*3/uL (ref 0.7–4.0)
MCV: 88.4 fL (ref 78.0–100.0)
Monocytes Relative: 8 % (ref 3–12)
Neutro Abs: 3.8 10*3/uL (ref 1.7–7.7)
Neutrophils Relative %: 62 % (ref 43–77)
RBC: 3.53 MIL/uL — ABNORMAL LOW (ref 3.87–5.11)
WBC: 6.2 10*3/uL (ref 4.0–10.5)

## 2013-10-15 LAB — COMPLETE METABOLIC PANEL WITH GFR
ALT: 13 U/L (ref 0–35)
AST: 18 U/L (ref 0–37)
Albumin: 4 g/dL (ref 3.5–5.2)
Alkaline Phosphatase: 86 U/L (ref 39–117)
CO2: 27 mEq/L (ref 19–32)
GFR, Est African American: 37 mL/min — ABNORMAL LOW
GFR, Est Non African American: 32 mL/min — ABNORMAL LOW
Glucose, Bld: 120 mg/dL — ABNORMAL HIGH (ref 70–99)
Potassium: 5.1 mEq/L (ref 3.5–5.3)
Sodium: 140 mEq/L (ref 135–145)
Total Protein: 6.9 g/dL (ref 6.0–8.3)

## 2013-10-15 LAB — LIPID PANEL
LDL Cholesterol: 54 mg/dL (ref 0–99)
Total CHOL/HDL Ratio: 3 Ratio
Triglycerides: 148 mg/dL (ref <150)
VLDL: 30 mg/dL (ref 0–40)

## 2013-10-15 LAB — POCT GLYCOSYLATED HEMOGLOBIN (HGB A1C): Hemoglobin A1C: 5.6
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
## 2013-11-05 ENCOUNTER — Other Ambulatory Visit: Payer: Self-pay | Admitting: Radiology

## 2013-11-05 ENCOUNTER — Other Ambulatory Visit: Payer: Self-pay | Admitting: Family Medicine

## 2013-11-05 MED ORDER — HYDROCODONE-ACETAMINOPHEN 10-325 MG PO TABS: 1.0000 | ORAL_TABLET | Freq: Four times a day (QID) | ORAL | Status: DC | PRN

## 2013-11-05 NOTE — Telephone Encounter (Signed)
Patient requesting renewal on hydrocodone, discussed with lab, last week has not gotten a reply. Please advise. Pended.

## 2013-11-06 NOTE — Telephone Encounter (Signed)
Last rx pt needed appt for further refills. Pt was seen in December no notes or refills sent. Ok to send them for 6 months?

## 2013-11-06 NOTE — Telephone Encounter (Signed)
Notified pt ready. 

## 2013-11-07 NOTE — Telephone Encounter (Signed)
Refills approved.

## 2013-12-04 ENCOUNTER — Telehealth: Payer: Self-pay | Admitting: Family Medicine

## 2013-12-04 MED ORDER — HYDROCODONE-ACETAMINOPHEN 10-325 MG PO TABS: 1.0000 | ORAL_TABLET | Freq: Four times a day (QID) | ORAL | Status: DC | PRN

## 2013-12-04 NOTE — Telephone Encounter (Signed)
Patient calling for refill of hydrocodone.  Rx provided to pt.

## 2014-01-09 ENCOUNTER — Emergency Department (HOSPITAL_COMMUNITY): Payer: 59

## 2014-01-09 ENCOUNTER — Ambulatory Visit (INDEPENDENT_AMBULATORY_CARE_PROVIDER_SITE_OTHER): Payer: 59 | Admitting: Family Medicine

## 2014-01-09 ENCOUNTER — Ambulatory Visit: Payer: 59

## 2014-01-09 ENCOUNTER — Emergency Department (HOSPITAL_COMMUNITY)
Admission: EM | Admit: 2014-01-09 | Discharge: 2014-01-10 | Disposition: A | Discharge status: Home / Self Care | Payer: 59 | Attending: Emergency Medicine | Admitting: Emergency Medicine

## 2014-01-09 ENCOUNTER — Encounter (HOSPITAL_COMMUNITY): Payer: Self-pay | Admitting: Emergency Medicine

## 2014-01-09 DIAGNOSIS — M542 Cervicalgia: Secondary | ICD-10-CM

## 2014-01-09 DIAGNOSIS — S0990XA Unspecified injury of head, initial encounter: Secondary | ICD-10-CM | POA: Insufficient documentation

## 2014-01-09 DIAGNOSIS — R110 Nausea: Secondary | ICD-10-CM

## 2014-01-09 DIAGNOSIS — Z9889 Other specified postprocedural states: Secondary | ICD-10-CM | POA: Insufficient documentation

## 2014-01-09 DIAGNOSIS — Z8711 Personal history of peptic ulcer disease: Secondary | ICD-10-CM | POA: Insufficient documentation

## 2014-01-09 DIAGNOSIS — R29898 Other symptoms and signs involving the musculoskeletal system: Secondary | ICD-10-CM

## 2014-01-09 DIAGNOSIS — Z79899 Other long term (current) drug therapy: Secondary | ICD-10-CM | POA: Insufficient documentation

## 2014-01-09 DIAGNOSIS — M79603 Pain in arm, unspecified: Secondary | ICD-10-CM

## 2014-01-09 DIAGNOSIS — F411 Generalized anxiety disorder: Secondary | ICD-10-CM | POA: Insufficient documentation

## 2014-01-09 DIAGNOSIS — M6281 Muscle weakness (generalized): Secondary | ICD-10-CM

## 2014-01-09 DIAGNOSIS — M1610 Unilateral primary osteoarthritis, unspecified hip: Secondary | ICD-10-CM | POA: Insufficient documentation

## 2014-01-09 DIAGNOSIS — Z7982 Long term (current) use of aspirin: Secondary | ICD-10-CM | POA: Insufficient documentation

## 2014-01-09 DIAGNOSIS — R519 Headache, unspecified: Secondary | ICD-10-CM

## 2014-01-09 DIAGNOSIS — S060X9A Concussion with loss of consciousness of unspecified duration, initial encounter: Secondary | ICD-10-CM

## 2014-01-09 DIAGNOSIS — M169 Osteoarthritis of hip, unspecified: Secondary | ICD-10-CM | POA: Insufficient documentation

## 2014-01-09 DIAGNOSIS — S143XXA Injury of brachial plexus, initial encounter: Secondary | ICD-10-CM | POA: Insufficient documentation

## 2014-01-09 DIAGNOSIS — E669 Obesity, unspecified: Secondary | ICD-10-CM | POA: Insufficient documentation

## 2014-01-09 DIAGNOSIS — R51 Headache: Secondary | ICD-10-CM

## 2014-01-09 DIAGNOSIS — I10 Essential (primary) hypertension: Secondary | ICD-10-CM | POA: Insufficient documentation

## 2014-01-09 DIAGNOSIS — E1139 Type 2 diabetes mellitus with other diabetic ophthalmic complication: Secondary | ICD-10-CM | POA: Insufficient documentation

## 2014-01-09 DIAGNOSIS — Z791 Long term (current) use of non-steroidal anti-inflammatories (NSAID): Secondary | ICD-10-CM | POA: Insufficient documentation

## 2014-01-09 DIAGNOSIS — F3289 Other specified depressive episodes: Secondary | ICD-10-CM | POA: Insufficient documentation

## 2014-01-09 DIAGNOSIS — M79609 Pain in unspecified limb: Secondary | ICD-10-CM

## 2014-01-09 DIAGNOSIS — S060XAA Concussion with loss of consciousness status unknown, initial encounter: Secondary | ICD-10-CM

## 2014-01-09 DIAGNOSIS — Y92410 Unspecified street and highway as the place of occurrence of the external cause: Secondary | ICD-10-CM | POA: Insufficient documentation

## 2014-01-09 DIAGNOSIS — Z87891 Personal history of nicotine dependence: Secondary | ICD-10-CM | POA: Insufficient documentation

## 2014-01-09 DIAGNOSIS — H538 Other visual disturbances: Secondary | ICD-10-CM

## 2014-01-09 DIAGNOSIS — E1142 Type 2 diabetes mellitus with diabetic polyneuropathy: Secondary | ICD-10-CM | POA: Insufficient documentation

## 2014-01-09 DIAGNOSIS — M5412 Radiculopathy, cervical region: Secondary | ICD-10-CM

## 2014-01-09 DIAGNOSIS — E1149 Type 2 diabetes mellitus with other diabetic neurological complication: Secondary | ICD-10-CM | POA: Insufficient documentation

## 2014-01-09 DIAGNOSIS — E11319 Type 2 diabetes mellitus with unspecified diabetic retinopathy without macular edema: Secondary | ICD-10-CM | POA: Insufficient documentation

## 2014-01-09 DIAGNOSIS — F329 Major depressive disorder, single episode, unspecified: Secondary | ICD-10-CM | POA: Insufficient documentation

## 2014-01-09 DIAGNOSIS — Y9389 Activity, other specified: Secondary | ICD-10-CM | POA: Insufficient documentation

## 2014-01-09 DIAGNOSIS — Z872 Personal history of diseases of the skin and subcutaneous tissue: Secondary | ICD-10-CM | POA: Insufficient documentation

## 2014-01-09 LAB — CBC WITH DIFFERENTIAL/PLATELET
BASOS ABS: 0 10*3/uL (ref 0.0–0.1)
Basophils Relative: 0 % (ref 0–1)
EOS PCT: 7 % — AB (ref 0–5)
Eosinophils Absolute: 0.4 10*3/uL (ref 0.0–0.7)
HEMATOCRIT: 31.6 % — AB (ref 36.0–46.0)
Hemoglobin: 10.3 g/dL — ABNORMAL LOW (ref 12.0–15.0)
LYMPHS ABS: 2.3 10*3/uL (ref 0.7–4.0)
LYMPHS PCT: 35 % (ref 12–46)
MCH: 28.9 pg (ref 26.0–34.0)
MCHC: 32.6 g/dL (ref 30.0–36.0)
MCV: 88.5 fL (ref 78.0–100.0)
MONO ABS: 0.4 10*3/uL (ref 0.1–1.0)
Monocytes Relative: 6 % (ref 3–12)
NEUTROS ABS: 3.5 10*3/uL (ref 1.7–7.7)
Neutrophils Relative %: 52 % (ref 43–77)
Platelets: 284 10*3/uL (ref 150–400)
RBC: 3.57 MIL/uL — AB (ref 3.87–5.11)
RDW: 14.6 % (ref 11.5–15.5)
WBC: 6.8 10*3/uL (ref 4.0–10.5)

## 2014-01-09 LAB — PROTIME-INR
INR: 0.96 (ref 0.00–1.49)
Prothrombin Time: 12.6 seconds (ref 11.6–15.2)
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
## 2014-01-09 NOTE — Patient Instructions (Signed)
If follow up needed after evaluation in emergency room, please return to [facility] at your convenience.  I am happy to see you, but Dr. Tamala Julian is also in the office this weekend if needed.
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
## 2014-01-09 NOTE — ED Notes (Signed)
Pt covered with warm blanket and ambient room temperature raised
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
## 2014-01-10 ENCOUNTER — Emergency Department (HOSPITAL_COMMUNITY): Payer: 59

## 2014-01-10 ENCOUNTER — Encounter (HOSPITAL_COMMUNITY): Payer: Self-pay | Admitting: Radiology

## 2014-01-10 DIAGNOSIS — S143XXA Injury of brachial plexus, initial encounter: Secondary | ICD-10-CM

## 2014-01-10 LAB — COMPREHENSIVE METABOLIC PANEL
ALT: 16 U/L (ref 0–35)
AST: 19 U/L (ref 0–37)
Albumin: 3.6 g/dL (ref 3.5–5.2)
Alkaline Phosphatase: 85 U/L (ref 39–117)
BILIRUBIN TOTAL: 0.4 mg/dL (ref 0.3–1.2)
BUN: 39 mg/dL — ABNORMAL HIGH (ref 6–23)
CALCIUM: 9.2 mg/dL (ref 8.4–10.5)
CHLORIDE: 102 meq/L (ref 96–112)
CO2: 25 meq/L (ref 19–32)
Creatinine, Ser: 1.58 mg/dL — ABNORMAL HIGH (ref 0.50–1.10)
GFR, EST AFRICAN AMERICAN: 39 mL/min — AB (ref >90)
GFR, EST NON AFRICAN AMERICAN: 34 mL/min — AB (ref >90)
GLUCOSE: 120 mg/dL — AB (ref 70–99)
Potassium: 4.4 mEq/L (ref 3.7–5.3)
Sodium: 141 mEq/L (ref 137–147)
Total Protein: 7.4 g/dL (ref 6.0–8.3)

## 2014-01-10 MED ORDER — OXYCODONE-ACETAMINOPHEN 5-325 MG PO TABS
1.0000 | ORAL_TABLET | Freq: Once | ORAL | Status: AC
  Administered 2014-01-10: 1 via ORAL
  Filled 2014-01-10: qty 1

## 2014-01-10 MED ORDER — OXYCODONE-ACETAMINOPHEN 5-325 MG PO TABS: 1.0000 | ORAL_TABLET | Freq: Four times a day (QID) | ORAL | Status: DC | PRN

## 2014-01-10 NOTE — ED Notes (Signed)
Pt C-Collar changed to aspen collar per Dr Dina Rich order
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
## 2014-01-10 NOTE — ED Notes (Signed)
Trauma End 

## 2014-01-14 ENCOUNTER — Ambulatory Visit (INDEPENDENT_AMBULATORY_CARE_PROVIDER_SITE_OTHER): Payer: 59 | Admitting: Family Medicine

## 2014-01-14 ENCOUNTER — Encounter: Payer: Self-pay | Admitting: Family Medicine

## 2014-01-14 VITALS — BP 130/70 | HR 66 | Temp 98.5°F | Resp 16 | Ht 63.0 in | Wt 218.0 lb

## 2014-01-14 DIAGNOSIS — E7800 Pure hypercholesterolemia, unspecified: Secondary | ICD-10-CM

## 2014-01-14 DIAGNOSIS — I10 Essential (primary) hypertension: Secondary | ICD-10-CM

## 2014-01-14 DIAGNOSIS — M5137 Other intervertebral disc degeneration, lumbosacral region: Secondary | ICD-10-CM

## 2014-01-14 DIAGNOSIS — M542 Cervicalgia: Secondary | ICD-10-CM

## 2014-01-14 DIAGNOSIS — M5136 Other intervertebral disc degeneration, lumbar region: Secondary | ICD-10-CM

## 2014-01-14 DIAGNOSIS — F32A Depression, unspecified: Secondary | ICD-10-CM

## 2014-01-14 DIAGNOSIS — F3289 Other specified depressive episodes: Secondary | ICD-10-CM

## 2014-01-14 DIAGNOSIS — R29898 Other symptoms and signs involving the musculoskeletal system: Secondary | ICD-10-CM

## 2014-01-14 DIAGNOSIS — E1142 Type 2 diabetes mellitus with diabetic polyneuropathy: Secondary | ICD-10-CM

## 2014-01-14 DIAGNOSIS — E119 Type 2 diabetes mellitus without complications: Secondary | ICD-10-CM

## 2014-01-14 DIAGNOSIS — E1139 Type 2 diabetes mellitus with other diabetic ophthalmic complication: Secondary | ICD-10-CM

## 2014-01-14 DIAGNOSIS — E11319 Type 2 diabetes mellitus with unspecified diabetic retinopathy without macular edema: Secondary | ICD-10-CM

## 2014-01-14 DIAGNOSIS — F329 Major depressive disorder, single episode, unspecified: Secondary | ICD-10-CM

## 2014-01-14 LAB — LIPID PANEL
Cholesterol: 173 mg/dL (ref 0–200)
HDL: 48 mg/dL (ref >39)
LDL CALC: 94 mg/dL (ref 0–99)
Total CHOL/HDL Ratio: 3.6 Ratio
Triglycerides: 156 mg/dL — ABNORMAL HIGH (ref <150)
VLDL: 31 mg/dL (ref 0–40)

## 2014-01-14 LAB — POCT GLYCOSYLATED HEMOGLOBIN (HGB A1C): HEMOGLOBIN A1C: 5.5

## 2014-01-14 MED ORDER — METFORMIN HCL ER 500 MG PO TB24: 2000.0000 mg | ORAL_TABLET | Freq: Two times a day (BID) | ORAL | Status: DC

## 2014-01-14 MED ORDER — TRAZODONE HCL 100 MG PO TABS: 150.0000 mg | ORAL_TABLET | Freq: Every day | ORAL | Status: DC

## 2014-01-14 MED ORDER — HYDROCODONE-ACETAMINOPHEN 10-325 MG PO TABS: 1.0000 | ORAL_TABLET | Freq: Two times a day (BID) | ORAL | Status: DC

## 2014-01-14 MED ORDER — FUROSEMIDE 20 MG PO TABS: 20.0000 mg | ORAL_TABLET | Freq: Every day | ORAL | Status: DC
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
## 2014-01-17 ENCOUNTER — Ambulatory Visit (INDEPENDENT_AMBULATORY_CARE_PROVIDER_SITE_OTHER): Payer: 59 | Admitting: Neurology

## 2014-01-17 ENCOUNTER — Encounter (INDEPENDENT_AMBULATORY_CARE_PROVIDER_SITE_OTHER): Payer: Self-pay

## 2014-01-17 ENCOUNTER — Encounter: Payer: Self-pay | Admitting: Neurology

## 2014-01-17 VITALS — BP 123/84 | HR 76 | Ht 63.0 in | Wt 217.0 lb

## 2014-01-17 DIAGNOSIS — M542 Cervicalgia: Secondary | ICD-10-CM

## 2014-01-17 DIAGNOSIS — M79601 Pain in right arm: Secondary | ICD-10-CM

## 2014-01-17 DIAGNOSIS — M79609 Pain in unspecified limb: Secondary | ICD-10-CM

## 2014-01-17 DIAGNOSIS — Z0289 Encounter for other administrative examinations: Secondary | ICD-10-CM

## 2014-01-17 MED ORDER — GABAPENTIN 300 MG PO CAPS: 300.0000 mg | ORAL_CAPSULE | Freq: Three times a day (TID) | ORAL | Status: DC
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
## 2014-01-18 MED ORDER — SIMVASTATIN 20 MG PO TABS: 20.0000 mg | ORAL_TABLET | Freq: Every day | ORAL | Status: DC

## 2014-01-18 NOTE — Addendum Note (Signed)
Addended by: Wardell Honour on: 01/18/2014 09:33 AM   Modules accepted: Orders

## 2014-01-21 ENCOUNTER — Telehealth: Payer: Self-pay | Admitting: *Deleted

## 2014-01-21 NOTE — Telephone Encounter (Signed)
Dr. Tamala Julian gave me paperwork to fill out for insurance- I have printed required documentation and faxed. The paperwork was put in for scanning to Pt Chart.

## 2014-01-28 ENCOUNTER — Inpatient Hospital Stay: Admission: RE | Admit: 2014-01-28 | Payer: 59 | Source: Ambulatory Visit

## 2014-01-28 ENCOUNTER — Ambulatory Visit
Admission: RE | Admit: 2014-01-28 | Discharge: 2014-01-28 | Disposition: A | Discharge status: Home / Self Care | Payer: 59 | Source: Ambulatory Visit | Attending: Family Medicine | Admitting: Family Medicine

## 2014-01-28 ENCOUNTER — Other Ambulatory Visit: Payer: 59

## 2014-01-28 DIAGNOSIS — M542 Cervicalgia: Secondary | ICD-10-CM

## 2014-01-28 DIAGNOSIS — R29898 Other symptoms and signs involving the musculoskeletal system: Secondary | ICD-10-CM

## 2014-01-30 NOTE — Progress Notes (Signed)
Quick Note:  Dr. Rhea Belton patient: Please advise patient that her recent MRI neck showed no acute changes in her spinal cord and no evidence of acute injury. Nevertheless, there is evidence of mild to moderate degenerative changes at different levels. No spinal cord signal changes are reported. She may benefit from seeing a spine specialist or neurosurgeon for further evaluation and potential treatment avenues. No herniated disc. Dr. Krista Blue will address this in more detail with her this month during her followup appointment. Star Age, MD, PhD [facility]  ______

## 2014-02-07 ENCOUNTER — Telehealth: Payer: Self-pay

## 2014-02-07 NOTE — Telephone Encounter (Signed)
Patient's daughter dropped off form to be completed by Dr. Tamala Julian. Placed in Dr. Thompson Caul box on same day.

## 2014-02-08 NOTE — Telephone Encounter (Signed)
Call patient --- performed peer-to-peer review with [facility] Director regarding patient's MRI chest.  [facility] physician to review case and make decision; no approval provided today on phone.  [employer] has received documentation from [facility] and [facility] prior to today and has denied order for MRI thus far.  Would be most helpful for neurology to perform peer-to-peer if feels MRI still warranted.  Recommend patient follow-up with Dr. Krista Blue regarding need for MRI chest if denied at this time.

## 2014-02-10 NOTE — Telephone Encounter (Signed)
Spoke to Jennifer. 

## 2014-02-10 NOTE — Telephone Encounter (Signed)
Lm for rtn call on Angel French's voicemail.

## 2014-02-14 ENCOUNTER — Telehealth: Payer: Self-pay

## 2014-02-14 NOTE — Telephone Encounter (Signed)
Spoke to patient.  She will follow up with neurologist next week and see if they can arrange for her to have the MRI done (per Dr. Tamala Julian).

## 2014-02-15 ENCOUNTER — Encounter: Payer: 59 | Admitting: Neurology

## 2014-02-16 ENCOUNTER — Other Ambulatory Visit: Payer: Self-pay | Admitting: Family Medicine

## 2014-02-16 ENCOUNTER — Telehealth: Payer: Self-pay | Admitting: *Deleted

## 2014-02-16 NOTE — Telephone Encounter (Signed)
Pt needs a refill on hydrocodone

## 2014-02-18 MED ORDER — HYDROCODONE-ACETAMINOPHEN 10-325 MG PO TABS: 1.0000 | ORAL_TABLET | Freq: Two times a day (BID) | ORAL | Status: DC

## 2014-02-18 NOTE — Telephone Encounter (Signed)
Meds ordered this encounter  Medications  . HYDROcodone-acetaminophen (NORCO) 10-325 MG per tablet    Sig: Take 1 tablet by mouth 2 (two) times daily.    Dispense:  60 tablet    Refill:  0    Order Specific Question:  Supervising Provider    Answer:  Laney Pastor, ROBERT P [[id number]]    Please notify patient.

## 2014-02-18 NOTE — Telephone Encounter (Signed)
I am checking my messages this week but I am at a conference thus unable to sign rx.  I am forwarding message to the PA pool to refill hydrocodone for patient.  Thanks!

## 2014-02-18 NOTE — Telephone Encounter (Signed)
Advised pt rx ready.

## 2014-02-19 ENCOUNTER — Ambulatory Visit (INDEPENDENT_AMBULATORY_CARE_PROVIDER_SITE_OTHER): Payer: 59 | Admitting: Neurology

## 2014-02-19 ENCOUNTER — Encounter: Payer: Self-pay | Admitting: Neurology

## 2014-02-19 VITALS — BP 143/78 | HR 69 | Ht 63.0 in | Wt 229.0 lb

## 2014-02-19 DIAGNOSIS — M25519 Pain in unspecified shoulder: Secondary | ICD-10-CM

## 2014-02-19 DIAGNOSIS — M79609 Pain in unspecified limb: Secondary | ICD-10-CM

## 2014-02-19 DIAGNOSIS — M79601 Pain in right arm: Secondary | ICD-10-CM

## 2014-02-19 DIAGNOSIS — M542 Cervicalgia: Secondary | ICD-10-CM

## 2014-02-19 DIAGNOSIS — M25511 Pain in right shoulder: Secondary | ICD-10-CM
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
## 2014-03-17 ENCOUNTER — Telehealth: Payer: Self-pay | Admitting: Family Medicine

## 2014-03-17 NOTE — Telephone Encounter (Signed)
Patient requested a refill on Vicodin 10-325 mg. [phone]

## 2014-03-18 MED ORDER — HYDROCODONE-ACETAMINOPHEN 10-325 MG PO TABS: 1.0000 | ORAL_TABLET | Freq: Two times a day (BID) | ORAL | Status: DC

## 2014-03-18 NOTE — Telephone Encounter (Signed)
Call --- hydrocodone rx ready for pick up at 102.

## 2014-03-18 NOTE — Telephone Encounter (Signed)
Notified pt ready. 

## 2014-04-01 DIAGNOSIS — Z0271 Encounter for disability determination: Secondary | ICD-10-CM

## 2014-04-24 ENCOUNTER — Encounter: Payer: Self-pay | Admitting: Family Medicine

## 2014-04-24 ENCOUNTER — Ambulatory Visit (INDEPENDENT_AMBULATORY_CARE_PROVIDER_SITE_OTHER): Payer: 59 | Admitting: Family Medicine

## 2014-04-24 VITALS — BP 102/60 | HR 66 | Temp 98.1°F | Resp 16 | Ht 62.5 in | Wt 222.0 lb

## 2014-04-24 DIAGNOSIS — E7800 Pure hypercholesterolemia, unspecified: Secondary | ICD-10-CM

## 2014-04-24 DIAGNOSIS — N182 Chronic kidney disease, stage 2 (mild): Secondary | ICD-10-CM

## 2014-04-24 DIAGNOSIS — B3749 Other urogenital candidiasis: Secondary | ICD-10-CM

## 2014-04-24 DIAGNOSIS — E119 Type 2 diabetes mellitus without complications: Secondary | ICD-10-CM

## 2014-04-24 DIAGNOSIS — M5136 Other intervertebral disc degeneration, lumbar region: Secondary | ICD-10-CM

## 2014-04-24 DIAGNOSIS — F329 Major depressive disorder, single episode, unspecified: Secondary | ICD-10-CM

## 2014-04-24 DIAGNOSIS — F3289 Other specified depressive episodes: Secondary | ICD-10-CM

## 2014-04-24 DIAGNOSIS — M5137 Other intervertebral disc degeneration, lumbosacral region: Secondary | ICD-10-CM

## 2014-04-24 DIAGNOSIS — F32A Depression, unspecified: Secondary | ICD-10-CM

## 2014-04-24 DIAGNOSIS — R1013 Epigastric pain: Secondary | ICD-10-CM

## 2014-04-24 DIAGNOSIS — I10 Essential (primary) hypertension: Secondary | ICD-10-CM

## 2014-04-24 LAB — LIPID PANEL
Cholesterol: 236 mg/dL — ABNORMAL HIGH (ref 0–200)
HDL: 40 mg/dL (ref >39)
LDL Cholesterol: 149 mg/dL — ABNORMAL HIGH (ref 0–99)
Total CHOL/HDL Ratio: 5.9 Ratio
Triglycerides: 234 mg/dL — ABNORMAL HIGH (ref <150)
VLDL: 47 mg/dL — ABNORMAL HIGH (ref 0–40)

## 2014-04-24 LAB — COMPREHENSIVE METABOLIC PANEL
ALT: 22 U/L (ref 0–35)
AST: 25 U/L (ref 0–37)
Albumin: 3.9 g/dL (ref 3.5–5.2)
Alkaline Phosphatase: 77 U/L (ref 39–117)
BUN: 27 mg/dL — ABNORMAL HIGH (ref 6–23)
CHLORIDE: 103 meq/L (ref 96–112)
CO2: 24 mEq/L (ref 19–32)
Calcium: 9.3 mg/dL (ref 8.4–10.5)
Creat: 1.65 mg/dL — ABNORMAL HIGH (ref 0.50–1.10)
Glucose, Bld: 79 mg/dL (ref 70–99)
Potassium: 4.8 mEq/L (ref 3.5–5.3)
Sodium: 138 mEq/L (ref 135–145)
Total Bilirubin: 0.4 mg/dL (ref 0.2–1.2)
Total Protein: 6.9 g/dL (ref 6.0–8.3)

## 2014-04-24 LAB — CBC WITH DIFFERENTIAL/PLATELET
Basophils Absolute: 0 10*3/uL (ref 0.0–0.1)
Basophils Relative: 0 % (ref 0–1)
Eosinophils Absolute: 0.3 10*3/uL (ref 0.0–0.7)
Eosinophils Relative: 6 % — ABNORMAL HIGH (ref 0–5)
HEMATOCRIT: 32.2 % — AB (ref 36.0–46.0)
Hemoglobin: 10.6 g/dL — ABNORMAL LOW (ref 12.0–15.0)
LYMPHS PCT: 37 % (ref 12–46)
Lymphs Abs: 1.8 10*3/uL (ref 0.7–4.0)
MCH: 29.2 pg (ref 26.0–34.0)
MCHC: 32.9 g/dL (ref 30.0–36.0)
MCV: 88.7 fL (ref 78.0–100.0)
MONO ABS: 0.3 10*3/uL (ref 0.1–1.0)
Monocytes Relative: 7 % (ref 3–12)
Neutro Abs: 2.5 10*3/uL (ref 1.7–7.7)
Neutrophils Relative %: 50 % (ref 43–77)
Platelets: 245 10*3/uL (ref 150–400)
RBC: 3.63 MIL/uL — ABNORMAL LOW (ref 3.87–5.11)
RDW: 14.3 % (ref 11.5–15.5)
WBC: 4.9 10*3/uL (ref 4.0–10.5)

## 2014-04-24 LAB — POCT GLYCOSYLATED HEMOGLOBIN (HGB A1C): Hemoglobin A1C: 5.7

## 2014-04-24 MED ORDER — NYSTATIN 100000 UNIT/GM EX CREA: 1.0000 "application " | TOPICAL_CREAM | Freq: Two times a day (BID) | CUTANEOUS | Status: DC

## 2014-04-24 MED ORDER — FLUCONAZOLE 100 MG PO TABS: ORAL_TABLET | ORAL | Status: DC

## 2014-04-24 MED ORDER — OMEPRAZOLE 40 MG PO CPDR: 40.0000 mg | DELAYED_RELEASE_CAPSULE | Freq: Every day | ORAL | Status: DC

## 2014-04-24 MED ORDER — HYDROCODONE-ACETAMINOPHEN 10-325 MG PO TABS: 1.0000 | ORAL_TABLET | Freq: Two times a day (BID) | ORAL | Status: DC

## 2014-04-24 MED ORDER — RANITIDINE HCL 150 MG PO CAPS: 150.0000 mg | ORAL_CAPSULE | Freq: Every evening | ORAL | Status: DC
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
## 2014-04-25 ENCOUNTER — Encounter: Payer: Self-pay | Admitting: Family Medicine

## 2014-04-25 MED ORDER — SIMVASTATIN 20 MG PO TABS
20.0000 mg | ORAL_TABLET | Freq: Every day | ORAL | Status: DC
Start: 2014-04-25 — End: 2014-07-10

## 2014-04-25 NOTE — Addendum Note (Signed)
Addended by: Wardell Honour on: 04/25/2014 10:53 AM   Modules accepted: Orders

## 2014-04-25 NOTE — Addendum Note (Signed)
Addended by: Wardell Honour on: 04/25/2014 10:54 AM   Modules accepted: Orders

## 2014-04-25 NOTE — Addendum Note (Signed)
Addended by: Wardell Honour on: 04/25/2014 08:22 AM   Modules accepted: Orders

## 2014-04-27 ENCOUNTER — Other Ambulatory Visit: Payer: Self-pay | Admitting: Family Medicine

## 2014-04-30 ENCOUNTER — Ambulatory Visit
Admission: RE | Admit: 2014-04-30 | Discharge: 2014-04-30 | Disposition: A | Discharge status: Home / Self Care | Payer: 59 | Source: Ambulatory Visit | Attending: Family Medicine | Admitting: Family Medicine

## 2014-04-30 DIAGNOSIS — N182 Chronic kidney disease, stage 2 (mild): Secondary | ICD-10-CM

## 2014-05-09 ENCOUNTER — Other Ambulatory Visit: Payer: Self-pay | Admitting: Family Medicine

## 2014-05-09 DIAGNOSIS — N289 Disorder of kidney and ureter, unspecified: Secondary | ICD-10-CM

## 2014-05-09 MED ORDER — GLIPIZIDE ER 2.5 MG PO TB24: 2.5000 mg | ORAL_TABLET | Freq: Every day | ORAL | Status: DC

## 2014-06-05 ENCOUNTER — Other Ambulatory Visit: Payer: Self-pay | Admitting: Family Medicine

## 2014-06-06 MED ORDER — HYDROCODONE-ACETAMINOPHEN 10-325 MG PO TABS: 1.0000 | ORAL_TABLET | Freq: Two times a day (BID) | ORAL | Status: DC

## 2014-06-06 NOTE — Telephone Encounter (Signed)
LMOM and also notified on MyChart that Rx is ready.

## 2014-06-06 NOTE — Telephone Encounter (Signed)
Call ---- vicodin is ready for pick up.

## 2014-06-06 NOTE — Telephone Encounter (Signed)
Angel French - Pt needs a refill on her vicodin [phone]

## 2014-06-16 ENCOUNTER — Ambulatory Visit (INDEPENDENT_AMBULATORY_CARE_PROVIDER_SITE_OTHER): Payer: 59 | Admitting: Family Medicine

## 2014-06-16 VITALS — BP 132/80 | HR 80 | Temp 98.9°F | Resp 16 | Ht 62.25 in | Wt 225.0 lb

## 2014-06-16 DIAGNOSIS — S30821A Blister (nonthermal) of abdominal wall, initial encounter: Secondary | ICD-10-CM

## 2014-06-16 DIAGNOSIS — S2092XA Blister (nonthermal) of unspecified parts of thorax, initial encounter: Secondary | ICD-10-CM

## 2014-06-16 DIAGNOSIS — E119 Type 2 diabetes mellitus without complications: Secondary | ICD-10-CM

## 2014-06-16 NOTE — Progress Notes (Signed)
Subjective:   This chart was scribed for Wardell Honour, MD by Forrestine Him, [facility] Scribe. This patient was seen in room 3 and the patient's care was started 8:24 AM.    Patient ID: Angel French, female    DOB: [birth year], 63 y.o.   MRN: [id number]  06/16/2014  Herpes Zoster and Nausea   HPI  HPI Comments: Angel French is a 63 y.o. female with a PMHx of DM, and HTN who presents to [facility] complaining of a persistent, painful blister to the R lower abdomen onset 2-3 days that has progressively grown in size. Pt describes discomfort as a burning sensation along with some itching to the area. She also reports nausea and mild chills she associates with blister. Pt states initially she noted 3 individual "clusters that have now grown together". She denies attempting to drain the area. Pt mentions experiencing a short episode of severe abdominal pain that she attributed to possibly taking too many laxatives. At this time she denies any fever. She denies a history of chicken pox. Pt has not been vaccinated for shingles. She denies any known tick bites. Pt with known allergies to multiple medications as listed below. No other concerns this visit.  Review of Systems  Constitutional: Positive for chills. Negative for fever.  Gastrointestinal: Positive for nausea.  Skin: Positive for rash (blister to R lower abdomen).  Neurological: Negative for weakness and numbness.  Psychiatric/Behavioral: Negative for confusion.    Past Medical History  Diagnosis Date  . Diabetes mellitus   . Neuromuscular disorder   . Anxiety   . Hypertension   . Depression   . Allergy     generic allergy pill; Spring and Fall only.  . Arthritis     DDD lumbar, R hip OA.  s/p ortho consult in past.  . Diabetic peripheral neuropathy associated with type 2 diabetes mellitus   . Blood transfusion without reported diagnosis     Mountain climbing accident in [place].  .  Cataract     B retractions.  Marland Kitchen Ulcer     Peptic ulcer H. Pylori + s/p treatment.  Upper GI diagnosed.[name] Prilosec PRN .  Marland Kitchen Diabetic retinopathy associated with type 2 diabetes mellitus     s/p laser treatment multiple.  Unable to drive.  . Brachial plexus disorders    Past Surgical History  Procedure Laterality Date  . Cholecystectomy    . Eye surgery      Cataracts B.  . Tonsillectomy    . Abdominal hysterectomy      DUB; ovaries intact.  . Carpal tunnel release      Bilateral.  . Behavioral helath admission      age 35; three months in [place].  . Cardiac catheterization  11/02/2007    normal coronary arteries.   Allergies  Allergen Reactions  . Codeine Anaphylaxis  . Contrast Media [Iodinated Diagnostic Agents] Anaphylaxis  . Nitrofurantoin Monohyd Macro Anaphylaxis  . Iodine Hives  . Red Dye Itching  . Ultram [Tramadol Hcl] Nausea And Vomiting   Current Outpatient Prescriptions  Medication Sig Dispense Refill  . aspirin 325 MG tablet Take 325 mg by mouth 2 (two) times daily.        . fluconazole (DIFLUCAN) 100 MG tablet One tablet daily x 7 days  7 tablet  0  . FLUoxetine (PROZAC) 20 MG capsule Take 60 mg by mouth daily.      Marland Kitchen  furosemide (LASIX) 20 MG tablet Take 1 tablet (20 mg total) by mouth daily.  30 tablet  5  . glipiZIDE (GLUCOTROL XL) 2.5 MG 24 hr tablet Take 1 tablet (2.5 mg total) by mouth daily with breakfast.  30 tablet  5  . HYDROcodone-acetaminophen (NORCO) 10-325 MG per tablet Take 1 tablet by mouth 2 (two) times daily.  60 tablet  0  . meloxicam (MOBIC) 15 MG tablet TAKE ONE TABLET BY MOUTH ONE TIME DAILY   30 tablet  5  . nystatin cream (MYCOSTATIN) Apply 1 application topically 2 (two) times daily.  90 g  5  . omeprazole (PRILOSEC) 40 MG capsule Take 1 capsule (40 mg total) by mouth daily.  30 capsule  5  . simvastatin (ZOCOR) 20 MG tablet Take 1 tablet (20 mg total) by mouth at bedtime.  30 tablet  11  . traZODone (DESYREL) 100 MG tablet Take  1.5 tablets (150 mg total) by mouth at bedtime.  45 tablet  5  . metFORMIN (GLUCOPHAGE-XR) 500 MG 24 hr tablet Take 4 tablets (2,000 mg total) by mouth 2 (two) times daily.  120 tablet  11  . ranitidine (ZANTAC) 150 MG capsule Take 1 capsule (150 mg total) by mouth every evening.  30 capsule  2   No current facility-administered medications for this visit.       Objective:    BP 132/80  Pulse 80  Temp(Src) 98.9 F (37.2 C)  Resp 16  Ht 5' 2.25" (1.581 m)  Wt 225 lb (102.059 kg)  BMI 40.83 kg/m2  SpO2 100% Physical Exam  Nursing note and vitals reviewed. Constitutional: She is oriented to person, place, and time. She appears well-developed and well-nourished.  HENT:  Head: Normocephalic.  Eyes: EOM are normal.  Neck: Normal range of motion.  Cardiovascular: Normal rate.   Pulmonary/Chest: Effort normal and breath sounds normal. She has no wheezes.  Abdominal: Soft. Bowel sounds are normal. She exhibits no distension and no mass. There is no tenderness. There is no rebound and no guarding.  Musculoskeletal: Normal range of motion.  Neurological: She is alert and oriented to person, place, and time.  Skin: No erythema.  Moderate sized vesicle without surrounding erythema  Psychiatric: She has a normal mood and affect.        Assessment & Plan:   1. Blister of abdominal wall without infection, initial encounter   2. Type II or unspecified type diabetes mellitus without mention of complication, not stated as uncontrolled    1.  Blister R lower abdomen:  New.  HSV culture obtained.  Ddx includes insect bite with local allergic reaction versus early Zoster; monitor over upcoming 72 hours.  If additional lesions develop, will treat as Herpes Zoster.  Local wound care reviewed. 2.  DMII: controlled; increased risk of infection.  No orders of the defined types were placed in this encounter.    No Follow-up on file.    I personally performed the services described in this  documentation, which was scribed in my presence. The recorded information has been reviewed and is accurate.   Reginia Forts, M.D.  [facility] [address] [place], [place]  [address] [phone] phone [phone] fax

## 2014-06-17 ENCOUNTER — Telehealth: Payer: Self-pay

## 2014-06-17 NOTE — Telephone Encounter (Signed)
Pt reports new pustules have shown up and wants the medication sent to her pharmacy.  Please advise.

## 2014-06-17 NOTE — Telephone Encounter (Signed)
Pt was seen by Dr. Reginia Forts yesterday (8/16) for shingles, and was told to call back  If her shingles began to spread. Pt states that they are spreading. Please advise pt.

## 2014-06-18 MED ORDER — VALACYCLOVIR HCL 1 G PO TABS: 1000.0000 mg | ORAL_TABLET | Freq: Three times a day (TID) | ORAL | Status: DC

## 2014-06-18 MED ORDER — PREDNISONE 20 MG PO TABS: ORAL_TABLET | ORAL | Status: DC

## 2014-06-18 NOTE — Telephone Encounter (Signed)
Call --- rx for Valtrex sent to [facility] pharmacy. I also sent in an rx for Prednisone which will help with inflammation and thus pain.

## 2014-06-18 NOTE — Telephone Encounter (Signed)
Spoke to pt, she is aware of prescriptions sent.  She is now awaiting culture results.

## 2014-06-19 ENCOUNTER — Telehealth: Payer: Self-pay | Admitting: Family Medicine

## 2014-06-19 LAB — HERPES SIMPLEX VIRUS CULTURE: Organism ID, Bacteria: NOT DETECTED

## 2014-06-19 NOTE — Telephone Encounter (Signed)
Patient daughter, Anderson Malta,  called for patient's results. They are back but needs review. Please advise. She states that the "shingles" is spreading.

## 2014-06-20 NOTE — Telephone Encounter (Signed)
Patient has the chicken pox, can't take bendryl due to other.   [phone]  What can she take for the itch?  [facility] [address].    Wants the culture read and relayed to her.

## 2014-06-20 NOTE — Telephone Encounter (Signed)
Culture is back and was negative for Herpes.  She states she is now covered in the rash. She is unable to wear bra. She states it is itching horrible. She can not take benadryl due to taking Vicodin for pain. Advised pt to try the benadryl cream or ointment to prevent sedation. Pt will get some today- she really believes this is chicken pox. The area that was used for the culture is strange compared to the other areas. Advised pt this was mostly likely the result of opening the area to take the culture. She says it is more scabbed over than the rest- does not look infected.  Please advise if pt can have something else for itching.

## 2014-07-10 ENCOUNTER — Encounter: Payer: Self-pay | Admitting: Family Medicine

## 2014-07-10 ENCOUNTER — Ambulatory Visit (INDEPENDENT_AMBULATORY_CARE_PROVIDER_SITE_OTHER): Payer: 59 | Admitting: Family Medicine

## 2014-07-10 VITALS — BP 125/67 | HR 78 | Temp 98.1°F | Resp 16 | Ht 62.0 in | Wt 233.0 lb

## 2014-07-10 DIAGNOSIS — F32A Depression, unspecified: Secondary | ICD-10-CM

## 2014-07-10 DIAGNOSIS — Z23 Encounter for immunization: Secondary | ICD-10-CM

## 2014-07-10 DIAGNOSIS — E11359 Type 2 diabetes mellitus with proliferative diabetic retinopathy without macular edema: Secondary | ICD-10-CM

## 2014-07-10 DIAGNOSIS — E1142 Type 2 diabetes mellitus with diabetic polyneuropathy: Secondary | ICD-10-CM

## 2014-07-10 DIAGNOSIS — M51379 Other intervertebral disc degeneration, lumbosacral region without mention of lumbar back pain or lower extremity pain: Secondary | ICD-10-CM

## 2014-07-10 DIAGNOSIS — E7800 Pure hypercholesterolemia, unspecified: Secondary | ICD-10-CM

## 2014-07-10 DIAGNOSIS — Z1239 Encounter for other screening for malignant neoplasm of breast: Secondary | ICD-10-CM

## 2014-07-10 DIAGNOSIS — M51369 Other intervertebral disc degeneration, lumbar region without mention of lumbar back pain or lower extremity pain: Secondary | ICD-10-CM

## 2014-07-10 DIAGNOSIS — E1139 Type 2 diabetes mellitus with other diabetic ophthalmic complication: Secondary | ICD-10-CM

## 2014-07-10 DIAGNOSIS — E113599 Type 2 diabetes mellitus with proliferative diabetic retinopathy without macular edema, unspecified eye: Secondary | ICD-10-CM

## 2014-07-10 DIAGNOSIS — F3289 Other specified depressive episodes: Secondary | ICD-10-CM

## 2014-07-10 DIAGNOSIS — M5136 Other intervertebral disc degeneration, lumbar region: Secondary | ICD-10-CM

## 2014-07-10 DIAGNOSIS — I10 Essential (primary) hypertension: Secondary | ICD-10-CM

## 2014-07-10 DIAGNOSIS — F329 Major depressive disorder, single episode, unspecified: Secondary | ICD-10-CM

## 2014-07-10 DIAGNOSIS — E119 Type 2 diabetes mellitus without complications: Secondary | ICD-10-CM

## 2014-07-10 DIAGNOSIS — Z1211 Encounter for screening for malignant neoplasm of colon: Secondary | ICD-10-CM

## 2014-07-10 DIAGNOSIS — M5137 Other intervertebral disc degeneration, lumbosacral region: Secondary | ICD-10-CM

## 2014-07-10 DIAGNOSIS — E1149 Type 2 diabetes mellitus with other diabetic neurological complication: Secondary | ICD-10-CM

## 2014-07-10 DIAGNOSIS — Z0000 Encounter for general adult medical examination without abnormal findings: Secondary | ICD-10-CM

## 2014-07-10 LAB — POCT URINALYSIS DIPSTICK
BILIRUBIN UA: NEGATIVE
Blood, UA: NEGATIVE
GLUCOSE UA: NEGATIVE
KETONES UA: NEGATIVE
LEUKOCYTES UA: NEGATIVE
Nitrite, UA: NEGATIVE
PROTEIN UA: NEGATIVE
Spec Grav, UA: <=1.005
Urobilinogen, UA: 0.2
pH, UA: 5

## 2014-07-10 LAB — POCT GLYCOSYLATED HEMOGLOBIN (HGB A1C): Hemoglobin A1C: 7.3

## 2014-07-10 MED ORDER — GLIPIZIDE ER 5 MG PO TB24: 5.0000 mg | ORAL_TABLET | Freq: Two times a day (BID) | ORAL | Status: DC

## 2014-07-10 MED ORDER — HYDROCODONE-ACETAMINOPHEN 10-325 MG PO TABS: 1.0000 | ORAL_TABLET | Freq: Two times a day (BID) | ORAL | Status: DC

## 2014-07-10 MED ORDER — INSULIN GLARGINE 100 UNITS/ML SOLOSTAR PEN: 10.0000 [IU] | PEN_INJECTOR | Freq: Every day | SUBCUTANEOUS | Status: DC

## 2014-07-10 MED ORDER — FLUOXETINE HCL 20 MG PO TABS: 40.0000 mg | ORAL_TABLET | Freq: Every day | ORAL | Status: DC

## 2014-07-10 MED ORDER — OMEPRAZOLE 40 MG PO CPDR: 40.0000 mg | DELAYED_RELEASE_CAPSULE | Freq: Every day | ORAL | Status: DC

## 2014-07-10 MED ORDER — SIMVASTATIN 20 MG PO TABS: 20.0000 mg | ORAL_TABLET | Freq: Every day | ORAL | Status: DC

## 2014-07-10 MED ORDER — FUROSEMIDE 20 MG PO TABS: 20.0000 mg | ORAL_TABLET | Freq: Every day | ORAL | Status: DC

## 2014-07-10 MED ORDER — TRAZODONE HCL 100 MG PO TABS: 200.0000 mg | ORAL_TABLET | Freq: Every day | ORAL | Status: DC

## 2014-07-10 NOTE — Progress Notes (Signed)
   Subjective:    Patient ID: Angel French, female    DOB: 11/23/1950, 63 y.o.   MRN: [id number]  HPI    Review of Systems  Constitutional: Positive for appetite change.  Eyes: Positive for photophobia and visual disturbance.  Gastrointestinal: Positive for constipation.  Genitourinary: Positive for urgency and frequency.  Musculoskeletal: Positive for arthralgias and back pain.  Allergic/Immunologic: Positive for environmental allergies.  Psychiatric/Behavioral: Positive for sleep disturbance. The patient is nervous/anxious.        Objective:   Physical Exam        Assessment & Plan:

## 2014-07-10 NOTE — Progress Notes (Signed)
Subjective:    Patient ID: Angel French, female    DOB: 10-Aug-1951, 63 y.o.   MRN: [id number]  This chart was scribed for Wardell Honour, MD by Rosary Lively, ED scribe. This patient was seen in room Room/bed 21 and the patient's care was started at 2:07 PM.   HPI  HPI Comments:  Angel French is a 63 y.o. female who presents to [facility] for Complete Physical Examination.  Last Physical unknown.  Pap smear in 2011, pt reports that she was told that she no longer needs to have them because she had a hysterectomy, and has never had an abnormal exam. Pt had hysterectomy for heavy bleeding. She suspects that she was precancerous, and that hysterectomy was performed as a precaution in 31.  Mammogram in 2013 and pt is compliant with scheduling an appointment. Pt normally sees office on the corner of [address].  Colonoscopy: Pt has never had a colonoscopy. Tetanus in 2011. Pneumonia Shot: Pt is unsure if she has ever had one, and is willing to accept. Flu vaccine: agreeable Zostavax: never; thinks just had chicken pox. Eye exam: every six months by Dr. Bing Plume.  Scheduled 08/05/14. Dental exam: non-compliant.    Diabetes: Pt reports that her sugar has been running 235 in the morning. Pt reports that she is hungry all the time.  Metformin stopped at last visit due to elevated creatinine.  Started on Glipizide 2.64m daily which is not helping sugars at all. Admits to non-compliance with diabetic diet recently; appetite has greatly increased.   8. Diabetic retinophathy: Pt is scheduled for eye appointment on 08/05/2014. Pt has been advised to schedule for laser surgery. Pt has cataracts and 7 laser surgeries for diabetic retinopathy. Pt reports that her left eye is currently very cloudy.  10. Emotional/Depression: Pt reports that she is fine.  Would like to decrease Prozac at this time and increase Trazodone dose.  11. Insomnia:  Trazodone not working really well at this time;  taking a total of two tablets throughout the night.  Pt reports that she takes a half tablet, because she has to wake up twice during the night to take dog out. Pt reports that she snores occassionally, but awakes feeling refreshed.  She takes 1/2 tablet of Trazodone at bedtime; then she takes another half of Trazodone each time she wakes up with the dog.    12. Urinary Incontinence: Pt reports that she saw urologist for incontinence. Pt was told that she had 3 types; stress, urge and 3rd type of incontinence. Pt reports that she goes through 3 to 4 pads a day.  Chronic issue for patient.    13.DDD lumbar spine:  Pt reports experiencing a great deal of lower back pain when changing positions at times.  Has increased hydrocodone use lately to bid.    Pt is adopted, and does not have any family medical history.   Pt does not consume alcohol. Pt attempts to walk short distances with dog.   Pt has not taken zantac and that she rarely has any issues.  Pt reports that she has not been contacted with an appointment for nephrologist.   Review of Systems  Constitutional: Positive for appetite change. Negative for fever, chills, diaphoresis, activity change, fatigue and unexpected weight change.  HENT: Positive for dental problem and hearing loss. Negative for congestion, drooling, ear discharge, ear pain, facial swelling, mouth sores, nosebleeds, postnasal drip, rhinorrhea, sinus pressure, sneezing, sore throat, tinnitus, trouble  swallowing and voice change.   Eyes: Positive for photophobia and visual disturbance. Negative for pain, discharge, redness and itching.  Respiratory: Negative for apnea, cough, choking, chest tightness, shortness of breath, wheezing and stridor.   Cardiovascular: Negative for chest pain, palpitations and leg swelling.  Gastrointestinal: Positive for constipation. Negative for nausea, vomiting, abdominal pain, diarrhea, blood in stool, abdominal distention, anal bleeding and  rectal pain.  Endocrine: Negative for cold intolerance, heat intolerance, polydipsia, polyphagia and polyuria.  Genitourinary: Positive for urgency and frequency. Negative for dysuria, hematuria, flank pain, decreased urine volume, vaginal bleeding, vaginal discharge, enuresis, difficulty urinating, genital sores, vaginal pain, menstrual problem, pelvic pain and dyspareunia.  Musculoskeletal: Positive for arthralgias, back pain and joint swelling. Negative for gait problem, myalgias, neck pain and neck stiffness.  Skin: Negative for color change, pallor, rash and wound.  Allergic/Immunologic: Positive for environmental allergies. Negative for food allergies and immunocompromised state.  Neurological: Positive for headaches. Negative for dizziness, tremors, seizures, syncope, facial asymmetry, speech difficulty, weakness, light-headedness and numbness.  Hematological: Negative for adenopathy. Does not bruise/bleed easily.  Psychiatric/Behavioral: Positive for sleep disturbance. Negative for suicidal ideas, hallucinations, behavioral problems, confusion, self-injury, dysphoric mood, decreased concentration and agitation. The patient is nervous/anxious. The patient is not hyperactive.    Past Medical History  Diagnosis Date  . Diabetes mellitus   . Neuromuscular disorder   . Anxiety   . Hypertension   . Depression   . Allergy     generic allergy pill; Spring and Fall only.  . Arthritis     DDD lumbar, R hip OA.  s/p ortho consult in past.  . Diabetic peripheral neuropathy associated with type 2 diabetes mellitus   . Blood transfusion without reported diagnosis     Mountain climbing accident in [place].  . Cataract     B retractions.  Marland Kitchen Ulcer     Peptic ulcer H. Pylori + s/p treatment.  Upper GI diagnosed.[name] Prilosec PRN .  Marland Kitchen Diabetic retinopathy associated with type 2 diabetes mellitus     s/p laser treatment multiple.  Unable to drive.  . Brachial plexus disorders    Past Surgical  History  Procedure Laterality Date  . Cholecystectomy    . Tonsillectomy    . Carpal tunnel release      Bilateral.  . Behavioral helath admission      age 30; three months in [place].  . Cardiac catheterization  11/02/2007    normal coronary arteries.  . Abdominal hysterectomy  11/02/1979    DUB; cervical dysplasia; ovaries intact.  . Eye surgery      Cataracts B. Laser surgery x 7 for Diabetic Retinopathy  . Cataract extraction, bilateral     Allergies  Allergen Reactions  . Codeine Anaphylaxis  . Contrast Media [Iodinated Diagnostic Agents] Anaphylaxis  . Nitrofurantoin Monohyd Macro Anaphylaxis  . Iodine Hives  . Red Dye Itching  . Ultram [Tramadol Hcl] Nausea And Vomiting   Outpatient Encounter Prescriptions as of 07/10/2014  Medication Sig  . aspirin 325 MG tablet Take 325 mg by mouth 2 (two) times daily.    . furosemide (LASIX) 20 MG tablet Take 1 tablet (20 mg total) by mouth daily.  Marland Kitchen glipiZIDE (GLUCOTROL XL) 5 MG 24 hr tablet Take 1 tablet (5 mg total) by mouth 2 (two) times daily after a meal.  . HYDROcodone-acetaminophen (NORCO) 10-325 MG per tablet Take 1 tablet by mouth 2 (two) times daily.  . meloxicam (MOBIC) 15 MG tablet TAKE  ONE TABLET BY MOUTH ONE TIME DAILY   . nystatin cream (MYCOSTATIN) Apply 1 application topically 2 (two) times daily.  Marland Kitchen omeprazole (PRILOSEC) 40 MG capsule Take 1 capsule (40 mg total) by mouth daily.  . simvastatin (ZOCOR) 20 MG tablet Take 1 tablet (20 mg total) by mouth at bedtime.  . traZODone (DESYREL) 100 MG tablet Take 2 tablets (200 mg total) by mouth at bedtime.  . [DISCONTINUED] FLUoxetine (PROZAC) 20 MG capsule Take 60 mg by mouth daily.  . [DISCONTINUED] furosemide (LASIX) 20 MG tablet Take 1 tablet (20 mg total) by mouth daily.  . [DISCONTINUED] glipiZIDE (GLUCOTROL XL) 2.5 MG 24 hr tablet Take 1 tablet (2.5 mg total) by mouth daily with breakfast.  . [DISCONTINUED] HYDROcodone-acetaminophen (NORCO) 10-325 MG per tablet Take 1  tablet by mouth 2 (two) times daily.  . [DISCONTINUED] HYDROcodone-acetaminophen (NORCO) 10-325 MG per tablet Take 1 tablet by mouth 2 (two) times daily.  . [DISCONTINUED] HYDROcodone-acetaminophen (NORCO) 10-325 MG per tablet Take 1 tablet by mouth 2 (two) times daily.  . [DISCONTINUED] omeprazole (PRILOSEC) 40 MG capsule Take 1 capsule (40 mg total) by mouth daily.  . [DISCONTINUED] simvastatin (ZOCOR) 20 MG tablet Take 1 tablet (20 mg total) by mouth at bedtime.  . [DISCONTINUED] traZODone (DESYREL) 100 MG tablet Take 1.5 tablets (150 mg total) by mouth at bedtime.  Marland Kitchen FLUoxetine (PROZAC) 20 MG tablet Take 2 tablets (40 mg total) by mouth daily.  . insulin glargine (LANTUS) 100 unit/mL SOPN Inject 0.1 mLs (10 Units total) into the skin at bedtime.  . [DISCONTINUED] fluconazole (DIFLUCAN) 100 MG tablet One tablet daily x 7 days  . [DISCONTINUED] metFORMIN (GLUCOPHAGE-XR) 500 MG 24 hr tablet Take 4 tablets (2,000 mg total) by mouth 2 (two) times daily.  . [DISCONTINUED] predniSONE (DELTASONE) 20 MG tablet Two tablets daily x 5 days then one tablet daily x 5 days  . [DISCONTINUED] ranitidine (ZANTAC) 150 MG capsule Take 1 capsule (150 mg total) by mouth every evening.  . [DISCONTINUED] valACYclovir (VALTREX) 1000 MG tablet Take 1 tablet (1,000 mg total) by mouth 3 (three) times daily.      Objective:   Physical Exam  Nursing note and vitals reviewed. Constitutional: She is oriented to person, place, and time. She appears well-developed and well-nourished. No distress.  obese  HENT:  Head: Normocephalic and atraumatic.  Right Ear: External ear normal.  Left Ear: External ear normal.  Nose: Nose normal.  Mouth/Throat: Oropharynx is clear and moist.  Eyes: Conjunctivae and EOM are normal. Pupils are equal, round, and reactive to light.  Neck: Normal range of motion and full passive range of motion without pain. Neck supple. No JVD present. Carotid bruit is not present. No thyromegaly present.    Cardiovascular: Normal rate, regular rhythm and normal heart sounds.  Exam reveals no gallop and no friction rub.   No murmur heard. Pulmonary/Chest: Effort normal and breath sounds normal. She has no wheezes. She has no rales. Right breast exhibits no inverted nipple, no mass, no nipple discharge, no skin change and no tenderness. Left breast exhibits no inverted nipple, no mass, no nipple discharge, no skin change and no tenderness. Breasts are symmetrical.  Abdominal: Soft. Bowel sounds are normal. She exhibits no distension and no mass. There is no tenderness. There is no rebound and no guarding.  Genitourinary: Vagina normal. No breast swelling, tenderness, discharge or bleeding. There is no rash, tenderness or lesion on the right labia. There is no rash or tenderness on  the left labia.  Musculoskeletal: Normal range of motion.       Right shoulder: Normal.       Left shoulder: Normal.       Cervical back: Normal.       Right lower leg: She exhibits swelling.       Left lower leg: She exhibits swelling.  Mild swelling bilateral legs  Lymphadenopathy:    She has no cervical adenopathy.  Neurological: She is alert and oriented to person, place, and time. She has normal reflexes. No cranial nerve deficit. She exhibits normal muscle tone. Coordination normal.  Skin: Skin is warm and dry. No rash noted. She is not diaphoretic. No erythema. No pallor.  Pt has midline incision from upper abdomen to umbilicus.  Psychiatric: She has a normal mood and affect. Her behavior is normal. Judgment and thought content normal.    Results for orders placed in visit on 07/10/14  CBC WITH DIFFERENTIAL      Result Value Ref Range   WBC 5.7  4.0 - 10.5 K/uL   RBC 3.52 (*) 3.87 - 5.11 MIL/uL   Hemoglobin 10.3 (*) 12.0 - 15.0 g/dL   HCT 31.2 (*) 36.0 - 46.0 %   MCV 88.6  78.0 - 100.0 fL   MCH 29.3  26.0 - 34.0 pg   MCHC 33.0  30.0 - 36.0 g/dL   RDW 15.1  11.5 - 15.5 %   Platelets 264  150 - 400 K/uL    Neutrophils Relative % 54  43 - 77 %   Neutro Abs 3.1  1.7 - 7.7 K/uL   Lymphocytes Relative 33  12 - 46 %   Lymphs Abs 1.9  0.7 - 4.0 K/uL   Monocytes Relative 6  3 - 12 %   Monocytes Absolute 0.3  0.1 - 1.0 K/uL   Eosinophils Relative 7 (*) 0 - 5 %   Eosinophils Absolute 0.4  0.0 - 0.7 K/uL   Basophils Relative 0  0 - 1 %   Basophils Absolute 0.0  0.0 - 0.1 K/uL   Smear Review Criteria for review not met    COMPLETE METABOLIC PANEL WITH GFR      Result Value Ref Range   Sodium 139  135 - 145 mEq/L   Potassium 4.5  3.5 - 5.3 mEq/L   Chloride 101  96 - 112 mEq/L   CO2 28  19 - 32 mEq/L   Glucose, Bld 98  70 - 99 mg/dL   BUN 37 (*) 6 - 23 mg/dL   Creat 1.54 (*) 0.50 - 1.10 mg/dL   Total Bilirubin 0.6  0.2 - 1.2 mg/dL   Alkaline Phosphatase 85  39 - 117 U/L   AST 18  0 - 37 U/L   ALT 16  0 - 35 U/L   Total Protein 7.3  6.0 - 8.3 g/dL   Albumin 4.1  3.5 - 5.2 g/dL   Calcium 9.5  8.4 - 10.5 mg/dL   GFR, Est African American 41 (*)    GFR, Est Non African American 36 (*)   LIPID PANEL      Result Value Ref Range   Cholesterol 185  0 - 200 mg/dL   Triglycerides 148  <150 mg/dL   HDL 56  >39 mg/dL   Total CHOL/HDL Ratio 3.3     VLDL 30  0 - 40 mg/dL   LDL Cholesterol 99  0 - 99 mg/dL  TSH      Result  Value Ref Range   TSH 1.675  0.350 - 4.500 uIU/mL  MICROALBUMIN, URINE      Result Value Ref Range   Microalb, Ur 0.50  0.00 - 1.89 mg/dL  POCT GLYCOSYLATED HEMOGLOBIN (HGB A1C)      Result Value Ref Range   Hemoglobin A1C 7.3    POCT URINALYSIS DIPSTICK      Result Value Ref Range   Color, UA yellow     Clarity, UA clear     Glucose, UA neg     Bilirubin, UA neg     Ketones, UA neg     Spec Grav, UA <=1.005     Blood, UA neg     pH, UA 5.0     Protein, UA neg     Urobilinogen, UA 0.2     Nitrite, UA neg     Leukocytes, UA Negative      PNEUMOVAX AND INFLUENZA VACCINES ADMINISTERED.    Assessment & Plan:   Routine general medical examination at a health care  facility - Plan: POCT glycosylated hemoglobin (Hb A1C), POCT urinalysis dipstick, CBC with Differential, COMPLETE METABOLIC PANEL WITH GFR, Lipid panel, TSH, Microalbumin, urine  Colon cancer screening - Plan: Ambulatory referral to Gastroenterology  Breast cancer screening - Plan: MM DIGITAL SCREENING BILATERAL  Type II or unspecified type diabetes mellitus without mention of complication, not stated as uncontrolled - Plan: POCT glycosylated hemoglobin (Hb A1C), POCT urinalysis dipstick, Microalbumin, urine  Pure hypercholesterolemia - Plan: Lipid panel, TSH  Essential hypertension, benign - Plan: POCT urinalysis dipstick, CBC with Differential, COMPLETE METABOLIC PANEL WITH GFR, TSH  Need for prophylactic vaccination and inoculation against influenza - Plan: Flu Vaccine QUAD 36+ mos IM  Need for prophylactic vaccination against Streptococcus pneumoniae (pneumococcus) - Plan: Pneumococcal polysaccharide vaccine 23-valent greater than or equal to 2yo subcutaneous/IM  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 Complete Physical Examination: Anticipatory guidance --- weight loss, exercise.  Refer for mammogram and colonoscopy. S/p Pneumovax and influenza vaccines administered. 2.  Gynecological exam: completed; refer for mammogram. S/p hysterectomy.  Possible cervical dysplasia? 3.  Colon cancer screening: Refer for colonoscopy due to anemia. 4.  DMII: uncontrolled; cannot take Metformin due to renal insufficiency; increase GLucotorol XL to 46m bid; rx for Lantus provided to start 6-10 units qhs.  Eye exam UTD; obtain urine microalbumin.   5.  Hyperlipidemia: controlled; obtain labs; continue current medications. 6. Depression: stable/controlled; decrease Prozac to 41mdaily. 7. Insomnia: uncontrolled but must  get up multiple times per night with dog; refill of Trazodone 20063mhs.   8. DDD lumbar spine: stable; refill of Hydrocodone provided. 9. GERD: controlled with Omeprazole 37m65mily. 10. Venous stasis: stable; refill of Lasix provided. 11. Renal insufficiency: worsening; awaiting appointment with nephrology.  Meds ordered this encounter  Medications  . glipiZIDE (GLUCOTROL XL) 5 MG 24 hr tablet    Sig: Take 1 tablet (5 mg total) by mouth 2 (two) times daily after a meal.    Dispense:  60 tablet    Refill:  5  . insulin glargine (LANTUS) 100 unit/mL SOPN    Sig: Inject 0.1 mLs (10 Units total) into the skin at bedtime.    Dispense:  15 mL    Refill:  11  . traZODone (DESYREL) 100 MG tablet    Sig: Take 2 tablets (200 mg  total) by mouth at bedtime.    Dispense:  60 tablet    Refill:  5  . omeprazole (PRILOSEC) 40 MG capsule    Sig: Take 1 capsule (40 mg total) by mouth daily.    Dispense:  30 capsule    Refill:  11  . furosemide (LASIX) 20 MG tablet    Sig: Take 1 tablet (20 mg total) by mouth daily.    Dispense:  30 tablet    Refill:  11  . simvastatin (ZOCOR) 20 MG tablet    Sig: Take 1 tablet (20 mg total) by mouth at bedtime.    Dispense:  30 tablet    Refill:  11  . FLUoxetine (PROZAC) 20 MG tablet    Sig: Take 2 tablets (40 mg total) by mouth daily.    Dispense:  60 tablet    Refill:  5  . DISCONTD: HYDROcodone-acetaminophen (NORCO) 10-325 MG per tablet    Sig: Take 1 tablet by mouth 2 (two) times daily.    Dispense:  60 tablet    Refill:  0  . DISCONTD: HYDROcodone-acetaminophen (NORCO) 10-325 MG per tablet    Sig: Take 1 tablet by mouth 2 (two) times daily.    Dispense:  60 tablet    Refill:  0    DO NOT FILL UNTIL 08-09-14.  Marland Kitchen HYDROcodone-acetaminophen (NORCO) 10-325 MG per tablet    Sig: Take 1 tablet by mouth 2 (two) times daily.    Dispense:  60 tablet    Refill:  0    DO NOT FILL UNTIL 09-09-14.   I personally performed the services described in this  documentation, which was scribed in my presence.  The recorded information has been reviewed and is accurate.  Reginia Forts, M.D.  [facility] [address] [place], [place]  [address] [phone] phone [phone] fax

## 2014-07-10 NOTE — Patient Instructions (Signed)

## 2014-07-11 LAB — CBC WITH DIFFERENTIAL/PLATELET
BASOS ABS: 0 10*3/uL (ref 0.0–0.1)
Basophils Relative: 0 % (ref 0–1)
Eosinophils Absolute: 0.4 10*3/uL (ref 0.0–0.7)
Eosinophils Relative: 7 % — ABNORMAL HIGH (ref 0–5)
HEMATOCRIT: 31.2 % — AB (ref 36.0–46.0)
Hemoglobin: 10.3 g/dL — ABNORMAL LOW (ref 12.0–15.0)
LYMPHS ABS: 1.9 10*3/uL (ref 0.7–4.0)
LYMPHS PCT: 33 % (ref 12–46)
MCH: 29.3 pg (ref 26.0–34.0)
MCHC: 33 g/dL (ref 30.0–36.0)
MCV: 88.6 fL (ref 78.0–100.0)
MONO ABS: 0.3 10*3/uL (ref 0.1–1.0)
Monocytes Relative: 6 % (ref 3–12)
Neutro Abs: 3.1 10*3/uL (ref 1.7–7.7)
Neutrophils Relative %: 54 % (ref 43–77)
Platelets: 264 10*3/uL (ref 150–400)
RBC: 3.52 MIL/uL — AB (ref 3.87–5.11)
RDW: 15.1 % (ref 11.5–15.5)
WBC: 5.7 10*3/uL (ref 4.0–10.5)

## 2014-07-11 LAB — COMPLETE METABOLIC PANEL WITH GFR
ALBUMIN: 4.1 g/dL (ref 3.5–5.2)
ALT: 16 U/L (ref 0–35)
AST: 18 U/L (ref 0–37)
Alkaline Phosphatase: 85 U/L (ref 39–117)
BUN: 37 mg/dL — ABNORMAL HIGH (ref 6–23)
CALCIUM: 9.5 mg/dL (ref 8.4–10.5)
CO2: 28 mEq/L (ref 19–32)
Chloride: 101 mEq/L (ref 96–112)
Creat: 1.54 mg/dL — ABNORMAL HIGH (ref 0.50–1.10)
GFR, Est African American: 41 mL/min — ABNORMAL LOW
GFR, Est Non African American: 36 mL/min — ABNORMAL LOW
Glucose, Bld: 98 mg/dL (ref 70–99)
POTASSIUM: 4.5 meq/L (ref 3.5–5.3)
Sodium: 139 mEq/L (ref 135–145)
Total Bilirubin: 0.6 mg/dL (ref 0.2–1.2)
Total Protein: 7.3 g/dL (ref 6.0–8.3)

## 2014-07-11 LAB — LIPID PANEL
Cholesterol: 185 mg/dL (ref 0–200)
HDL: 56 mg/dL (ref >39)
LDL Cholesterol: 99 mg/dL (ref 0–99)
Total CHOL/HDL Ratio: 3.3 Ratio
Triglycerides: 148 mg/dL (ref <150)
VLDL: 30 mg/dL (ref 0–40)

## 2014-07-11 LAB — MICROALBUMIN, URINE: MICROALB UR: 0.5 mg/dL (ref 0.00–1.89)

## 2014-07-11 LAB — TSH: TSH: 1.675 u[IU]/mL (ref 0.350–4.500)

## 2014-07-19 ENCOUNTER — Other Ambulatory Visit: Payer: Self-pay | Admitting: *Deleted

## 2014-07-19 MED ORDER — BLOOD GLUCOSE MONITOR KIT: PACK | Status: DC

## 2014-07-19 NOTE — Telephone Encounter (Signed)
Received fax request for glucose monitoring kit and supplies from [employer].

## 2014-07-31 ENCOUNTER — Ambulatory Visit (INDEPENDENT_AMBULATORY_CARE_PROVIDER_SITE_OTHER): Payer: 59 | Admitting: Family Medicine

## 2014-07-31 ENCOUNTER — Ambulatory Visit (INDEPENDENT_AMBULATORY_CARE_PROVIDER_SITE_OTHER): Payer: 59

## 2014-07-31 VITALS — BP 138/88 | HR 74 | Temp 98.0°F | Resp 20

## 2014-07-31 DIAGNOSIS — M25559 Pain in unspecified hip: Secondary | ICD-10-CM

## 2014-07-31 DIAGNOSIS — M25551 Pain in right hip: Secondary | ICD-10-CM

## 2014-07-31 DIAGNOSIS — M25571 Pain in right ankle and joints of right foot: Secondary | ICD-10-CM

## 2014-07-31 DIAGNOSIS — M25579 Pain in unspecified ankle and joints of unspecified foot: Secondary | ICD-10-CM

## 2014-07-31 MED ORDER — OXYCODONE-ACETAMINOPHEN 7.5-325 MG PO TABS: 1.0000 | ORAL_TABLET | Freq: Three times a day (TID) | ORAL | Status: DC | PRN

## 2014-07-31 NOTE — Progress Notes (Signed)
Subjective:  This chart was scribed for Robyn Haber, MD by Mercy Moore, Medial Scribe. This patient was seen in room 11 and the patient's care was started at 8:45 PM.  Chief Complaint  Patient presents with   Hip Injury    fell--right hip   Leg Injury    right leg   Ankle Pain    right ankle pain--swollen     Patient ID: Angel French, female    DOB: 10/09/1951, 63 y.o.   MRN: [id number]  HPI HPI Comments: Angel French is a 63 y.o. female who presents to the [facility] after a recent fall. Patient reports slipping while walking down her wheelchair ramp, three nights ago. Patient reports sliding down the ramp and hitting each of the supports and she descended the incline. Patient is complaining of right ankle pain, lower leg pain, and right hip pain described as burning. Patient is complaining of pain with bearing weight on her right foot and is currently walking with a cane Patient has been walking with her cane to combat the difficulty with bearing weight.  Patient has been taking more Vicodin to treat her new pain along with ibuprofen, but she denies relief.    Patient Active Problem List   Diagnosis Date Noted   Right shoulder pain 02/19/2014   Neck pain 01/17/2014   Right arm pain 01/17/2014   Type II or unspecified type diabetes mellitus without mention of complication, not stated as uncontrolled 01/01/2013   Pure hypercholesterolemia 01/01/2013   Essential hypertension, benign 01/01/2013   Degenerative disc disease, lumbar 01/01/2013   Depression 01/01/2013   Osteoarthritis of right hip 01/01/2013   Diabetic peripheral neuropathy associated with type 2 diabetes mellitus 01/01/2013   Diabetic retinopathy 01/01/2013   Obesity, unspecified 01/01/2013   Past Medical History  Diagnosis Date   Diabetes mellitus    Neuromuscular disorder    Anxiety    Hypertension    Depression    Allergy     generic allergy pill;  Spring and Fall only.   Arthritis     DDD lumbar, R hip OA.  s/p ortho consult in past.   Diabetic peripheral neuropathy associated with type 2 diabetes mellitus    Blood transfusion without reported diagnosis     Mountain climbing accident in [place].   Cataract     B retractions.   Ulcer     Peptic ulcer H. Pylori + s/p treatment.  Upper GI diagnosed.[name] Prilosec PRN .   Diabetic retinopathy associated with type 2 diabetes mellitus     s/p laser treatment multiple.  Unable to drive.   Brachial plexus disorders    Past Surgical History  Procedure Laterality Date   Cholecystectomy     Tonsillectomy     Carpal tunnel release      Bilateral.   Behavioral helath admission      age 66; three months in [place].   Cardiac catheterization  11/02/2007    normal coronary arteries.   Abdominal hysterectomy  11/02/1979    DUB; cervical dysplasia; ovaries intact.   Eye surgery      Cataracts B. Laser surgery x 7 for Diabetic Retinopathy   Cataract extraction, bilateral     Allergies  Allergen Reactions   Codeine Anaphylaxis   Contrast Media [Iodinated Diagnostic Agents] Anaphylaxis   Nitrofurantoin Monohyd Macro Anaphylaxis   Iodine Hives   Red Dye Itching   Ultram [Tramadol Hcl] Nausea And Vomiting  Prior to Admission medications   Medication Sig Start Date End Date Taking? Authorizing Provider  aspirin 325 MG tablet Take 325 mg by mouth 2 (two) times daily.     Yes Historical Provider, MD  Blood Glucose Monitoring Suppl (BLOOD GLUCOSE METER KIT AND SUPPLIES) KIT Dispense based on patient and insurance preference. Use up to four times daily as directed. (FOR ICD-9 250.00, 250.01). 07/19/14  Yes Mancel Bale, PA-C  FLUoxetine (PROZAC) 20 MG tablet Take 2 tablets (40 mg total) by mouth daily. 07/10/14  Yes Wardell Honour, MD  furosemide (LASIX) 20 MG tablet Take 1 tablet (20 mg total) by mouth daily. 07/10/14  Yes Wardell Honour, MD  glipiZIDE (GLUCOTROL XL) 5 MG  24 hr tablet Take 1 tablet (5 mg total) by mouth 2 (two) times daily after a meal. 07/10/14  Yes Wardell Honour, MD  HYDROcodone-acetaminophen (NORCO) 10-325 MG per tablet Take 1 tablet by mouth 2 (two) times daily. 07/10/14  Yes Wardell Honour, MD  insulin glargine (LANTUS) 100 unit/mL SOPN Inject 0.1 mLs (10 Units total) into the skin at bedtime. 07/10/14  Yes Wardell Honour, MD  meloxicam (MOBIC) 15 MG tablet TAKE ONE TABLET BY MOUTH ONE TIME DAILY    Yes Wardell Honour, MD  nystatin cream (MYCOSTATIN) Apply 1 application topically 2 (two) times daily. 04/24/14  Yes Wardell Honour, MD  omeprazole (PRILOSEC) 40 MG capsule Take 1 capsule (40 mg total) by mouth daily. 07/10/14  Yes Wardell Honour, MD  simvastatin (ZOCOR) 20 MG tablet Take 1 tablet (20 mg total) by mouth at bedtime. 07/10/14  Yes Wardell Honour, MD  traZODone (DESYREL) 100 MG tablet Take 2 tablets (200 mg total) by mouth at bedtime. 07/10/14  Yes Wardell Honour, MD   History   Social History   Marital Status: Married    Spouse Name: N/A    Number of Children: 2   Years of Education: college   Occupational History        retretied   Social History Main Topics   Smoking status: Former Smoker   Smokeless tobacco: Never Used     Comment: Quit 1987   Alcohol Use: No   Drug Use: No   Sexual Activity: Yes     Comment: widow   Other Topics Concern   Not on file   Social History Narrative   Marital status: widowed since 2009; dating x 6 years.  Happy; no abuse.      Children: 2 children (49 daughter, 19 son estranged); 2 grandchildren.      Lives: with boyfriend, daughter, granddaughter, friend of daughter.  Lives in pt house.      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  Diabetic retinopathy; unable to drive.      Tobacco:  Smoked x 20 years; quit 20 years.      Alcohol:  Never.      Drugs:  None since college.      Exercise:  Walking several times per week; walks the dog.   Education college    Caffeine one cup daily.   Right handed             Review of Systems  Constitutional: Negative for fever and chills.  Musculoskeletal: Positive for arthralgias.  Neurological: Negative for weakness and numbness.       Objective:   Physical Exam  Nursing note and vitals reviewed. Constitutional: She is oriented to person,  place, and time. She appears well-developed and well-nourished. No distress.  obese  HENT:  Head: Normocephalic and atraumatic.  Eyes: EOM are normal.  Neck: Neck supple. No thyromegaly present.  Cardiovascular: Normal rate and regular rhythm.   Pulmonary/Chest: Effort normal and breath sounds normal. No respiratory distress.  Abdominal: Soft. She exhibits no distension and no mass. There is no tenderness. There is no rebound and no guarding.  Musculoskeletal: Normal range of motion. She exhibits tenderness.  Tender right illiac crest with underlying soft tissue fullness. Good ROM of hip and knee on the right. Mildly swollen and slightly ecchymotic of right lateral foot and ankle with tenderness over fifth metatarsal and malleolus.  No obvious ecchymosis over right hip.   Neurological: She is alert and oriented to person, place, and time. No cranial nerve deficit. Coordination normal.  Skin: Skin is warm and dry.  Psychiatric: She has a normal mood and affect. Her behavior is normal.     Filed Vitals:   07/31/14 1955  BP: 138/88  Pulse: 74  Temp: 98 F (36.7 C)  TempSrc: Oral  Resp: 20  SpO2: 97%   [facility] reading (PRIMARY) by  Dr. Joseph Art. Foot, ankle and hip on right side all negative for fx..       Assessment & Plan:   1. Pain in joint, ankle and foot, right   2. Hip pain, acute, right    Patient is a suffered a significant fall. She has contusions account for her pain. The repeated falls are somewhat concerning as patient is becoming elderly and is taking chronic narcotics. I will be treating the patient's acute pain a stronger pain  medicine, but long-term pain control may benefit from a pain consult referral.  I personally performed the services described in this documentation, which was scribed in my presence. The recorded information has been reviewed and is accurate.  Pain in joint, ankle and foot, right - Plan: DG Ankle Complete Right, DG Foot Complete Right, oxyCODONE-acetaminophen (PERCOCET) 7.5-325 MG per tablet  Hip pain, acute, right - Plan: DG Hip Complete Right, oxyCODONE-acetaminophen (PERCOCET) 7.5-325 MG per tablet  Signed, Robyn Haber, MD

## 2014-08-01 ENCOUNTER — Encounter: Payer: Self-pay | Admitting: Internal Medicine

## 2014-08-12 ENCOUNTER — Ambulatory Visit
Admission: RE | Admit: 2014-08-12 | Discharge: 2014-08-12 | Disposition: A | Discharge status: Home / Self Care | Payer: 59 | Source: Ambulatory Visit | Attending: Family Medicine | Admitting: Family Medicine

## 2014-08-12 DIAGNOSIS — Z1239 Encounter for other screening for malignant neoplasm of breast: Secondary | ICD-10-CM

## 2014-08-21 ENCOUNTER — Telehealth: Payer: Self-pay | Admitting: Family Medicine

## 2014-08-21 MED ORDER — FLUCONAZOLE 150 MG PO TABS: 150.0000 mg | ORAL_TABLET | Freq: Once | ORAL | Status: DC

## 2014-08-21 MED ORDER — NYSTATIN 100000 UNIT/GM EX CREA: 1.0000 "application " | TOPICAL_CREAM | Freq: Two times a day (BID) | CUTANEOUS | Status: DC

## 2014-08-21 NOTE — Telephone Encounter (Signed)
Patient calling complaining of the following:  1.  Sugars still elevated on Lantus 10 units daily and Glipizide.  2.  S/p evaluation by nephrologist; stopped NSAIDs.  Now hands hurting and swollen; lower back hurting more. Taking Hydrocodone bid.   3.  Yeast along panus has worsened; needs more Nystatin.

## 2014-08-28 ENCOUNTER — Encounter (HOSPITAL_COMMUNITY): Payer: 59

## 2014-09-03 ENCOUNTER — Other Ambulatory Visit (HOSPITAL_COMMUNITY): Payer: Self-pay | Admitting: *Deleted

## 2014-09-04 ENCOUNTER — Ambulatory Visit (HOSPITAL_COMMUNITY)
Admission: RE | Admit: 2014-09-04 | Discharge: 2014-09-04 | Disposition: A | Discharge status: Home / Self Care | Payer: 59 | Source: Ambulatory Visit | Attending: Nephrology | Admitting: Nephrology

## 2014-09-04 DIAGNOSIS — Z5181 Encounter for therapeutic drug level monitoring: Secondary | ICD-10-CM | POA: Diagnosis not present

## 2014-09-04 DIAGNOSIS — D509 Iron deficiency anemia, unspecified: Secondary | ICD-10-CM | POA: Diagnosis not present

## 2014-09-04 MED ORDER — SODIUM CHLORIDE 0.9 % IV SOLN
510.0000 mg | Freq: Once | INTRAVENOUS | Status: AC
  Administered 2014-09-04: 510 mg via INTRAVENOUS
  Filled 2014-09-04: qty 17

## 2014-09-04 NOTE — Discharge Instructions (Signed)

## 2014-09-18 ENCOUNTER — Ambulatory Visit (AMBULATORY_SURGERY_CENTER): Payer: Self-pay | Admitting: *Deleted

## 2014-09-18 VITALS — Ht 62.0 in | Wt 234.4 lb

## 2014-09-18 DIAGNOSIS — Z1211 Encounter for screening for malignant neoplasm of colon: Secondary | ICD-10-CM

## 2014-09-18 MED ORDER — MOVIPREP 100 G PO SOLR: 1.0000 | Freq: Once | ORAL | Status: DC

## 2014-09-18 NOTE — Progress Notes (Signed)
No egg or soy allergy ewm No problems with past sedation. ewm No home 02 use. ewm No diet pills, no blood thinners. ewm emmi video to pt's e mail. ewm

## 2014-10-02 ENCOUNTER — Encounter: Payer: Self-pay | Admitting: Internal Medicine

## 2014-10-02 ENCOUNTER — Ambulatory Visit (AMBULATORY_SURGERY_CENTER): Payer: 59 | Admitting: Internal Medicine

## 2014-10-02 VITALS — BP 131/69 | HR 67 | Temp 98.4°F | Resp 55 | Ht 62.0 in | Wt 234.0 lb

## 2014-10-02 DIAGNOSIS — Z1211 Encounter for screening for malignant neoplasm of colon: Secondary | ICD-10-CM

## 2014-10-02 LAB — GLUCOSE, CAPILLARY
GLUCOSE-CAPILLARY: 133 mg/dL — AB (ref 70–99)
Glucose-Capillary: 138 mg/dL — ABNORMAL HIGH (ref 70–99)

## 2014-10-02 MED ORDER — SODIUM CHLORIDE 0.9 % IV SOLN: 500.0000 mL | INTRAVENOUS | Status: DC

## 2014-10-02 NOTE — Op Note (Signed)
[place]  [address]. [place], [address]   COLONOSCOPY PROCEDURE REPORT  PATIENT: Angel French, Angel French  MR#: [id number] BIRTHDATE: 08-05-1951 , 63  yrs. old GENDER: female ENDOSCOPIST: Jerene Bears, MD REFERRED HX:[id number] Tamala Julian, M.D. PROCEDURE DATE:  10/02/2014 PROCEDURE:   Colonoscopy, screening First Screening Colonoscopy - Avg.  risk and is 50 yrs.  old or older Yes.  Prior Negative Screening - Now for repeat screening. N/A  History of Adenoma - Now for follow-up colonoscopy & has been > or = to 3 yrs.  N/A  Polyps Removed Today? No.  Polyps Removed Today? No.  Polyps Removed Today? No.  Recommend repeat exam, <10 yrs? No. ASA CLASS:   Class III INDICATIONS:first colonoscopy and average risk for colon cancer. MEDICATIONS: Monitored anesthesia care and Propofol 350 mg IV  DESCRIPTION OF PROCEDURE:   After the risks benefits and alternatives of the procedure were thoroughly explained, informed consent was obtained.  The digital rectal exam revealed no abnormalities of the rectum.   The LB [id number]  endoscope was introduced through the anus and advanced to the cecum, which was identified by both the appendix and ileocecal valve. No adverse events experienced.   The quality of the prep was good, using MoviPrep  The instrument was then slowly withdrawn as the colon was fully examined.   COLON FINDINGS: The colon was redundant requiring manual pressure and position change to advanced the scope into the cecum.  The cecal base at the proximal ICV was incompletely visualized due to colonic redundancy. Otherwise normal appearing cecum, ileocecal valve, and appendiceal orifice were identified.  The ascending, transverse, descending, sigmoid colon, and rectum appeared unremarkable.  Retroflexed views revealed no abnormalities. The time to cecum=14 minutes 25 seconds.  Withdrawal time=12 minutes 23 seconds.  The scope was withdrawn and the procedure  completed. COMPLICATIONS: There were no immediate complications.  ENDOSCOPIC IMPRESSION: Normal colonoscopy; redundant colon  RECOMMENDATIONS: 1.  You should continue to follow colorectal cancer screening guidelines for "routine risk" patients with a repeat colonoscopy in 10 years.  There is no need for FOBT (stool) testing for at least 5 years. 2.  If iron deficient would recommendation celiac testing and consideration of EGD  eSigned:  Jerene Bears, MD 10/02/2014 9:08 AM   cc: The Patient and Reginia Forts, MD

## 2014-10-02 NOTE — Progress Notes (Signed)
A/ox3 pleased with MAC, report to Celia RN 

## 2014-10-02 NOTE — Patient Instructions (Signed)
Discharge instructions given. Normal exam. Resume previous medications. YOU HAD AN ENDOSCOPIC PROCEDURE TODAY AT THE [facility]: Refer to the procedure report that was given to you for any specific questions about what was found during the examination.  If the procedure report does not answer your questions, please call your gastroenterologist to clarify.  If you requested that your care partner not be given the details of your procedure findings, then the procedure report has been included in a sealed envelope for you to review at your convenience later.  YOU SHOULD EXPECT: Some feelings of bloating in the abdomen. Passage of more gas than usual.  Walking can help get rid of the air that was put into your GI tract during the procedure and reduce the bloating. If you had a lower endoscopy (such as a colonoscopy or flexible sigmoidoscopy) you may notice spotting of blood in your stool or on the toilet paper. If you underwent a bowel prep for your procedure, then you may not have a normal bowel movement for a few days.  DIET: Your first meal following the procedure should be a light meal and then it is ok to progress to your normal diet.  A half-sandwich or bowl of soup is an example of a good first meal.  Heavy or fried foods are harder to digest and may make you feel nauseous or bloated.  Likewise meals heavy in dairy and vegetables can cause extra gas to form and this can also increase the bloating.  Drink plenty of fluids but you should avoid alcoholic beverages for 24 hours.  ACTIVITY: Your care partner should take you home directly after the procedure.  You should plan to take it easy, moving slowly for the rest of the day.  You can resume normal activity the day after the procedure however you should NOT DRIVE or use heavy machinery for 24 hours (because of the sedation medicines used during the test).    SYMPTOMS TO REPORT IMMEDIATELY: A gastroenterologist can be reached at any hour.   During normal business hours, 8:30 AM to 5:00 PM Monday through Friday, call [phone].  After hours and on weekends, please call the GI answering service at [phone] who will take a message and have the physician on call contact you.   Following lower endoscopy (colonoscopy or flexible sigmoidoscopy):  Excessive amounts of blood in the stool  Significant tenderness or worsening of abdominal pains  Swelling of the abdomen that is new, acute  Fever of 100F or higher  FOLLOW UP: If any biopsies were taken you will be contacted by phone or by letter within the next 1-3 weeks.  Call your gastroenterologist if you have not heard about the biopsies in 3 weeks.  Our staff will call the home number listed on your records the next business day following your procedure to check on you and address any questions or concerns that you may have at that time regarding the information given to you following your procedure. This is a courtesy call and so if there is no answer at the home number and we have not heard from you through the emergency physician on call, we will assume that you have returned to your regular daily activities without incident.  SIGNATURES/CONFIDENTIALITY: You and/or your care partner have signed paperwork which will be entered into your electronic medical record.  These signatures attest to the fact that that the information above on your After Visit Summary has been reviewed and is understood.    Full responsibility of the confidentiality of this discharge information lies with you and/or your care-partner.

## 2014-10-03 ENCOUNTER — Telehealth: Payer: Self-pay | Admitting: *Deleted

## 2014-10-03 NOTE — Telephone Encounter (Signed)
  Follow up Call-  Call back number 10/02/2014  Post procedure Call Back phone  # [phone]  Permission to leave phone message Yes     Patient questions:  Do you have a fever, pain , or abdominal swelling? No. Pain Score  0 *  Have you tolerated food without any problems? Yes.    Have you been able to return to your normal activities? Yes.    Do you have any questions about your discharge instructions: Diet   No. Medications  No. Follow up visit  No.  Do you have questions or concerns about your Care? No.  Actions: * If pain score is 4 or above: No action needed, pain <4.

## 2014-10-09 ENCOUNTER — Ambulatory Visit: Payer: 59 | Admitting: Family Medicine

## 2014-10-10 ENCOUNTER — Telehealth: Payer: Self-pay

## 2014-10-10 NOTE — Telephone Encounter (Signed)
Pt is needing a refill on diflucan, please advise when ready

## 2014-10-11 NOTE — Telephone Encounter (Signed)
LM to advise pt needs to RTC

## 2014-10-14 ENCOUNTER — Telehealth: Payer: Self-pay

## 2014-10-14 DIAGNOSIS — M5136 Other intervertebral disc degeneration, lumbar region: Secondary | ICD-10-CM

## 2014-10-14 MED ORDER — HYDROCODONE-ACETAMINOPHEN 10-325 MG PO TABS: 1.0000 | ORAL_TABLET | Freq: Two times a day (BID) | ORAL | Status: DC

## 2014-10-14 NOTE — Telephone Encounter (Signed)
Call --- rx ready for pick up. 

## 2014-10-14 NOTE — Telephone Encounter (Signed)
Patients appointment got changed to the 30th December with Dr. Tamala Julian. Until then she is needing her medication refilled for a month for Vicodin. Please call when ready. patient gave verbal consent for daughter Christiana Pellant to pick up for her.   Thank you.   Best: [phone]

## 2014-10-15 NOTE — Telephone Encounter (Signed)
Notified pt. 

## 2014-10-30 ENCOUNTER — Ambulatory Visit (INDEPENDENT_AMBULATORY_CARE_PROVIDER_SITE_OTHER): Payer: 59 | Admitting: Family Medicine

## 2014-10-30 ENCOUNTER — Other Ambulatory Visit: Payer: Self-pay | Admitting: Family Medicine

## 2014-10-30 ENCOUNTER — Encounter: Payer: Self-pay | Admitting: Family Medicine

## 2014-10-30 VITALS — BP 118/72 | HR 73 | Temp 97.9°F | Resp 16 | Ht 63.0 in | Wt 234.0 lb

## 2014-10-30 DIAGNOSIS — I10 Essential (primary) hypertension: Secondary | ICD-10-CM

## 2014-10-30 DIAGNOSIS — B379 Candidiasis, unspecified: Secondary | ICD-10-CM

## 2014-10-30 DIAGNOSIS — E1121 Type 2 diabetes mellitus with diabetic nephropathy: Secondary | ICD-10-CM

## 2014-10-30 DIAGNOSIS — M79642 Pain in left hand: Secondary | ICD-10-CM

## 2014-10-30 DIAGNOSIS — E7800 Pure hypercholesterolemia, unspecified: Secondary | ICD-10-CM

## 2014-10-30 DIAGNOSIS — M5136 Other intervertebral disc degeneration, lumbar region: Secondary | ICD-10-CM

## 2014-10-30 DIAGNOSIS — D509 Iron deficiency anemia, unspecified: Secondary | ICD-10-CM

## 2014-10-30 DIAGNOSIS — F32A Depression, unspecified: Secondary | ICD-10-CM

## 2014-10-30 DIAGNOSIS — F329 Major depressive disorder, single episode, unspecified: Secondary | ICD-10-CM

## 2014-10-30 DIAGNOSIS — M79641 Pain in right hand: Secondary | ICD-10-CM

## 2014-10-30 LAB — CBC WITH DIFFERENTIAL/PLATELET
Basophils Absolute: 0 10*3/uL (ref 0.0–0.1)
Basophils Relative: 0 % (ref 0–1)
Eosinophils Absolute: 0.4 10*3/uL (ref 0.0–0.7)
Eosinophils Relative: 8 % — ABNORMAL HIGH (ref 0–5)
HEMATOCRIT: 34.3 % — AB (ref 36.0–46.0)
Hemoglobin: 11.2 g/dL — ABNORMAL LOW (ref 12.0–15.0)
LYMPHS ABS: 1.7 10*3/uL (ref 0.7–4.0)
LYMPHS PCT: 30 % (ref 12–46)
MCH: 29.1 pg (ref 26.0–34.0)
MCHC: 32.7 g/dL (ref 30.0–36.0)
MCV: 89.1 fL (ref 78.0–100.0)
MONOS PCT: 8 % (ref 3–12)
MPV: 9.9 fL (ref 8.6–12.4)
Monocytes Absolute: 0.4 10*3/uL (ref 0.1–1.0)
NEUTROS PCT: 54 % (ref 43–77)
Neutro Abs: 3 10*3/uL (ref 1.7–7.7)
Platelets: 249 10*3/uL (ref 150–400)
RBC: 3.85 MIL/uL — AB (ref 3.87–5.11)
RDW: 14.9 % (ref 11.5–15.5)
WBC: 5.5 10*3/uL (ref 4.0–10.5)

## 2014-10-30 LAB — COMPREHENSIVE METABOLIC PANEL
ALK PHOS: 88 U/L (ref 39–117)
ALT: 15 U/L (ref 0–35)
AST: 18 U/L (ref 0–37)
Albumin: 3.8 g/dL (ref 3.5–5.2)
BUN: 32 mg/dL — ABNORMAL HIGH (ref 6–23)
CO2: 30 mEq/L (ref 19–32)
Calcium: 9.4 mg/dL (ref 8.4–10.5)
Chloride: 101 mEq/L (ref 96–112)
Creat: 1.49 mg/dL — ABNORMAL HIGH (ref 0.50–1.10)
GLUCOSE: 151 mg/dL — AB (ref 70–99)
POTASSIUM: 4.8 meq/L (ref 3.5–5.3)
Sodium: 140 mEq/L (ref 135–145)
TOTAL PROTEIN: 7.1 g/dL (ref 6.0–8.3)
Total Bilirubin: 0.7 mg/dL (ref 0.2–1.2)

## 2014-10-30 LAB — POCT GLYCOSYLATED HEMOGLOBIN (HGB A1C): HEMOGLOBIN A1C: 7.4

## 2014-10-30 LAB — LIPID PANEL
CHOL/HDL RATIO: 4.5 ratio
CHOLESTEROL: 171 mg/dL (ref 0–200)
HDL: 38 mg/dL — ABNORMAL LOW (ref >39)
LDL Cholesterol: 100 mg/dL — ABNORMAL HIGH (ref 0–99)
Triglycerides: 163 mg/dL — ABNORMAL HIGH (ref <150)
VLDL: 33 mg/dL (ref 0–40)

## 2014-10-30 MED ORDER — FLUOXETINE HCL 20 MG PO TABS: 60.0000 mg | ORAL_TABLET | Freq: Every day | ORAL | Status: DC

## 2014-10-30 MED ORDER — INSULIN SYRINGES (DISPOSABLE) U-100 0.5 ML MISC: 28.0000 [IU] | Freq: Two times a day (BID) | Status: DC

## 2014-10-30 MED ORDER — INSULIN GLARGINE 100 UNIT/ML ~~LOC~~ SOLN: 56.0000 [IU] | Freq: Every day | SUBCUTANEOUS | Status: DC

## 2014-10-30 MED ORDER — HYDROCODONE-ACETAMINOPHEN 10-325 MG PO TABS
1.0000 | ORAL_TABLET | Freq: Three times a day (TID) | ORAL | Status: DC | PRN
Start: 2014-10-30 — End: 2015-01-22

## 2014-10-30 MED ORDER — HYDROCODONE-ACETAMINOPHEN 10-325 MG PO TABS: 1.0000 | ORAL_TABLET | Freq: Three times a day (TID) | ORAL | Status: DC | PRN

## 2014-10-30 NOTE — Progress Notes (Addendum)
Subjective:    Patient ID: Angel French, female    DOB: November 05, 1950, 63 y.o.   MRN: [id number]  10/30/2014  Follow-up; Depression; Diabetes; Hyperlipidemia; and Back Pain   HPI This 63 y.o. female presents for three month follow-up:  1. DMII:  Fasting this morning 167.  No Lantus in several weeks due to difficulty with Solostar pen due to hand pain due to OA.    2.  Hyperlipidemia:  Patient reports good compliance with medication, good tolerance to medication, and good symptom control.  Denies HA/dizziness/focal weakness/paresthesias.   3. Anxiety and depression:  Daughter has really been struggling.  Really difficult with granddaughter.  Wants to increase Prozac to 60mg  daily.  4.  Chronic pain syndrome/DDD lumbar spine:  Taking hydrocodone bid.  Lower back pain doing well at this time. No b/b dysfunction; no saddle paresthesias. No radicular symptoms.  5.  Insomnia: sleeping well with Trazodone.  6. GERD:  Patient reports good compliance with medication, good tolerance to medication, and good symptom control.     7. Health Maintenance:  S/p colonoscopy that was completely normal; repeat in ten years. S/p mammogram 08/12/2014.   8. OA hands: horrible hand pain; decreased dexterity.  Pain is unbearable.  When we stopped Meloxicam, pain in hands is horrible.  Hands want to draw up.  Swollen.  Pain in MCP joints. If dog taps hands, pain is horrible.  B hand pain.  Pain is identical.  Constant pain.  Hydrocodone is not controlling pain.  Unable to pull up pants by myself.  Hand pain is much worse than chronic lower back pain.   Normal daily activities have become a struggle.    Review of Systems  Constitutional: Negative for fever, chills, diaphoresis and fatigue.  Eyes: Negative for visual disturbance.  Respiratory: Negative for cough and shortness of breath.   Cardiovascular: Negative for chest pain, palpitations and leg swelling.  Gastrointestinal: Negative for nausea, vomiting,  abdominal pain, diarrhea and constipation.  Endocrine: Negative for cold intolerance, heat intolerance, polydipsia, polyphagia and polyuria.  Musculoskeletal: Positive for arthralgias.  Skin: Negative for color change and rash.  Neurological: Negative for dizziness, tremors, seizures, syncope, facial asymmetry, speech difficulty, weakness, light-headedness, numbness and headaches.  Psychiatric/Behavioral: Positive for sleep disturbance and dysphoric mood. Negative for suicidal ideas and self-injury. The patient is nervous/anxious.     Past Medical History  Diagnosis Date  . Diabetes mellitus   . Neuromuscular disorder   . Anxiety   . Depression   . Allergy     generic allergy pill; Spring and Fall only.  . Arthritis     DDD lumbar, R hip OA.  s/p ortho consult in past.  . Diabetic peripheral neuropathy associated with type 2 diabetes mellitus   . Blood transfusion without reported diagnosis     Mountain climbing accident in [place].  . Cataract     B retractions.  Marland Kitchen Ulcer     Peptic ulcer H. Pylori + s/p treatment.  Upper GI diagnosed.[name] Prilosec PRN .  Marland Kitchen Diabetic retinopathy associated with type 2 diabetes mellitus     s/p laser treatment multiple.  Unable to drive.  . Brachial plexus disorders   . Hypertension     controlled, off meds   . GERD (gastroesophageal reflux disease)   . Hyperlipidemia   . Chronic kidney disease     stage 3 per pt.   . Diabetic retinopathy    Past Surgical History  Procedure Laterality Date  .  Cholecystectomy    . Tonsillectomy    . Carpal tunnel release      Bilateral.  . Behavioral helath admission      age 69; three months in [place].  . Cardiac catheterization  11/02/2007    normal coronary arteries.  . Abdominal hysterectomy  11/02/1979    DUB; cervical dysplasia; ovaries intact.  . Eye surgery      Cataracts B. Laser surgery x 7 for Diabetic Retinopathy  . Cataract extraction, bilateral    . Abdominal surgery      staph  abcess    Allergies  Allergen Reactions  . Codeine Anaphylaxis  . Contrast Media [Iodinated Diagnostic Agents] Anaphylaxis  . Nitrofurantoin Monohyd Macro Anaphylaxis  . Iodine Hives  . Red Dye Itching  . Ultram [Tramadol Hcl] Nausea And Vomiting        Objective:    BP 118/72 mmHg  Pulse 73  Temp(Src) 97.9 F (36.6 C) (Oral)  Resp 16  Ht 5\' 3"  (1.6 m)  Wt 234 lb (106.142 kg)  BMI 41.46 kg/m2  SpO2 96% Physical Exam  Constitutional: She is oriented to person, place, and time. She appears well-developed and well-nourished. No distress.  HENT:  Head: Normocephalic and atraumatic.  Right Ear: External ear normal.  Left Ear: External ear normal.  Nose: Nose normal.  Mouth/Throat: Oropharynx is clear and moist.  Eyes: Conjunctivae and EOM are normal. Pupils are equal, round, and reactive to light.  Neck: Normal range of motion. Neck supple. Carotid bruit is not present. No thyromegaly present.  Cardiovascular: Normal rate, regular rhythm, normal heart sounds and intact distal pulses.  Exam reveals no gallop and no friction rub.   No murmur heard. Pulmonary/Chest: Effort normal and breath sounds normal. She has no wheezes. She has no rales.  Abdominal: Soft. Bowel sounds are normal. She exhibits no distension and no mass. There is no tenderness. There is no rebound and no guarding.  Musculoskeletal:       Right hand: She exhibits decreased range of motion and swelling.       Left hand: She exhibits decreased range of motion and swelling.  Lymphadenopathy:    She has no cervical adenopathy.  Neurological: She is alert and oriented to person, place, and time. No cranial nerve deficit.  Skin: Skin is warm and dry. No rash noted. She is not diaphoretic. No erythema. No pallor.     Diffuse erythema in inguinal folds. Defined scaling border.  Psychiatric: She has a normal mood and affect. Her behavior is normal.        Assessment & Plan:   1. Type 2 diabetes mellitus with  diabetic nephropathy   2. Pure hypercholesterolemia   3. Essential hypertension, benign   4. Iron deficiency anemia   5. Bilateral hand pain   6. Depression   7. Degenerative disc disease, lumbar   8. Candidiasis     1. DMII: uncontrolled due to non-compliance with Lantus due to B hand pain.  Obtain labs; rx for Lantus vials provided at pt request. 2.  Hypercholesterolemia: stable; obtain labs; continue current medications. 3.  HTN: stable off of medication at this time. 4.  Iron deficiency anemia: stable; obtain CBC.  S/p colonoscopy that was WNL. 5.  B hand pain: New; noticeable when stopped Meloxicam due to renal insufficiency; refer to ortho for further evaluation; may be a candidate for an injection.  Rx for Voltaren gel provided. 6.  Depression and anxiety: worsening with family stressors; increase  Prozac to 60mg  daily. 7. Chronic lower back pain/DDD lumbar spine: stable; refill of Hydrocodone provided. 8. Recurrent candidiasis: Recurrent; refill of Diflucan provided.    Meds ordered this encounter  Medications  . DISCONTD: insulin glargine (LANTUS) 100 UNIT/ML injection    Sig: Inject 0.56 mLs (56 Units total) into the skin at bedtime.    Dispense:  10 mL    Refill:  11  . Insulin Syringes, Disposable, U-100 0.5 ML MISC    Sig: 28 Units by Does not apply route 2 (two) times daily.    Dispense:  100 each    Refill:  11    Disp: BD syringes only.  Marland Kitchen FLUoxetine (PROZAC) 20 MG tablet    Sig: Take 3 tablets (60 mg total) by mouth daily.    Dispense:  90 tablet    Refill:  5  . DISCONTD: HYDROcodone-acetaminophen (NORCO) 10-325 MG per tablet    Sig: Take 1 tablet by mouth 3 (three) times daily as needed.    Dispense:  90 tablet    Refill:  0  . DISCONTD: HYDROcodone-acetaminophen (NORCO) 10-325 MG per tablet    Sig: Take 1 tablet by mouth 3 (three) times daily as needed.    Dispense:  90 tablet    Refill:  0    DO NOT FILL UNTIL 11/30/13.  Marland Kitchen DISCONTD:  HYDROcodone-acetaminophen (NORCO) 10-325 MG per tablet    Sig: Take 1 tablet by mouth 3 (three) times daily as needed.    Dispense:  90 tablet    Refill:  0    DO NOT FILL UNTIL 12/31/2014.  Marland Kitchen DISCONTD: fluconazole (DIFLUCAN) 150 MG tablet    Sig: Take 1 tablet (150 mg total) by mouth once. Repeat if needed    Dispense:  2 tablet    Refill:  1  . diclofenac sodium (VOLTAREN) 1 % GEL    Sig: Apply 2 g topically 4 (four) times daily.    Dispense:  100 g    Refill:  3    Return in about 3 months (around 01/29/2015) for recheck.    Astin Rape Elayne Guerin, M.D. [facility] [address] [place], [place]  [phone] phone [phone] fax

## 2014-11-04 ENCOUNTER — Telehealth: Payer: Self-pay

## 2014-11-04 NOTE — Telephone Encounter (Signed)
Pt called and wants a prescription to Diflucan, she has a large yeast infection and Nystatin is not working. Please advise at [phone]

## 2014-11-05 ENCOUNTER — Other Ambulatory Visit: Payer: Self-pay | Admitting: Radiology

## 2014-11-05 DIAGNOSIS — D649 Anemia, unspecified: Secondary | ICD-10-CM

## 2014-11-05 MED ORDER — FLUCONAZOLE 150 MG PO TABS: 150.0000 mg | ORAL_TABLET | Freq: Once | ORAL | Status: DC

## 2014-11-05 MED ORDER — DICLOFENAC SODIUM 1 % TD GEL: 2.0000 g | Freq: Four times a day (QID) | TRANSDERMAL | Status: DC

## 2014-11-05 NOTE — Addendum Note (Signed)
Addended by: Wardell Honour on: 11/05/2014 10:12 AM   Modules accepted: Orders

## 2014-11-05 NOTE — Telephone Encounter (Signed)
Pt advised rx sent in. 

## 2014-11-05 NOTE — Telephone Encounter (Signed)
Rx sent in.  Please advise pt.

## 2014-11-06 LAB — ANA: Anti Nuclear Antibody(ANA): POSITIVE — AB

## 2014-11-06 LAB — ANTI-NUCLEAR AB-TITER (ANA TITER): ANA Titer 1: NEGATIVE

## 2014-11-06 LAB — RHEUMATOID FACTOR: Rhuematoid fact SerPl-aCnc: <10 IU/mL (ref <=14)

## 2014-11-26 ENCOUNTER — Telehealth: Payer: Self-pay

## 2014-11-26 DIAGNOSIS — M5136 Other intervertebral disc degeneration, lumbar region: Secondary | ICD-10-CM

## 2014-11-26 NOTE — Telephone Encounter (Signed)
Pt states she is in need of her VICODIN but I didn't see in system to put the mgs. Please call [phone] and she will pick up

## 2014-11-26 NOTE — Telephone Encounter (Signed)
I provided patient with 3 rx on 10/30/14.  She should not need refills quite yet.

## 2014-11-26 NOTE — Telephone Encounter (Signed)
Pt forgot and she does have these scripts.

## 2014-12-19 ENCOUNTER — Telehealth: Payer: Self-pay

## 2014-12-19 NOTE — Telephone Encounter (Signed)
The patient called to request rx for Diflucan.  The patient is experiencing a lot of yeast issues and is diabetic.  Please call the patient once this rx has been called in to the [facility] on [address].

## 2014-12-20 ENCOUNTER — Encounter: Payer: Self-pay | Admitting: Family Medicine

## 2014-12-20 MED ORDER — FLUCONAZOLE 100 MG PO TABS: ORAL_TABLET | ORAL | Status: DC

## 2014-12-20 NOTE — Telephone Encounter (Signed)
Diflucan prescription sent to [facility]; please call and advise patient.

## 2014-12-20 NOTE — Telephone Encounter (Signed)
Spoke with pt, advised Rx sent in. 

## 2014-12-24 NOTE — Telephone Encounter (Addendum)
Patient sent an email via [place] however she has not heard a response yet. Patient states that the Lantus is $80 and it used to be $10. Patient would like to try Humalog instead because it is $10. Can we make this happen for patient? [facility] [address].   [phone]

## 2014-12-26 ENCOUNTER — Encounter: Payer: Self-pay | Admitting: Family Medicine

## 2014-12-26 ENCOUNTER — Other Ambulatory Visit: Payer: Self-pay | Admitting: Family Medicine

## 2014-12-27 MED ORDER — FLUCONAZOLE 100 MG PO TABS: ORAL_TABLET | ORAL | Status: DC

## 2014-12-29 MED ORDER — INSULIN DETEMIR 100 UNIT/ML FLEXPEN: 60.0000 [IU] | Freq: Every day | SUBCUTANEOUS | Status: DC

## 2014-12-29 MED ORDER — NEEDLES & SYRINGES MISC: 1.0000 | Freq: Two times a day (BID) | Status: DC

## 2015-01-01 ENCOUNTER — Other Ambulatory Visit: Payer: Self-pay

## 2015-01-01 ENCOUNTER — Telehealth: Payer: Self-pay

## 2015-01-01 MED ORDER — INSULIN DETEMIR 100 UNIT/ML FLEXPEN
60.0000 [IU] | Freq: Every day | SUBCUTANEOUS | Status: DC
Start: 2015-01-01 — End: 2015-01-01

## 2015-01-01 MED ORDER — INSULIN DETEMIR 100 UNIT/ML FLEXPEN: 60.0000 [IU] | Freq: Every day | SUBCUTANEOUS | Status: DC

## 2015-01-01 NOTE — Telephone Encounter (Signed)
Levemir resent.

## 2015-01-01 NOTE — Telephone Encounter (Signed)
[facility] on [address] states they received the rx for insulin needles but did not receive the rx for levemir. I saw in the patient's chart where it was sent on Feb 28 like the needles but it wasn't received by [facility]. Can we resend?

## 2015-01-02 ENCOUNTER — Telehealth: Payer: Self-pay

## 2015-01-02 NOTE — Telephone Encounter (Signed)
Patient needs to have her HYDROcodone-acetaminophen (NORCO) 10-325 MG per tablet refilled. Please call when ready for pick up [phone]

## 2015-01-04 NOTE — Telephone Encounter (Signed)
Call -- I provided her with  3 prescriptions on 10/30/14.  Her last rx was to fill on 12/31/2014; she should not need a refill until 01/31/15.

## 2015-01-05 NOTE — Telephone Encounter (Signed)
Pt forgot that she had a 3rd rx. She will get it filled.

## 2015-01-22 ENCOUNTER — Telehealth: Payer: Self-pay | Admitting: *Deleted

## 2015-01-22 ENCOUNTER — Ambulatory Visit (INDEPENDENT_AMBULATORY_CARE_PROVIDER_SITE_OTHER): Payer: 59 | Admitting: Family Medicine

## 2015-01-22 ENCOUNTER — Encounter: Payer: Self-pay | Admitting: Family Medicine

## 2015-01-22 VITALS — BP 130/70 | HR 64 | Temp 97.5°F | Resp 16 | Ht 62.4 in | Wt 232.8 lb

## 2015-01-22 DIAGNOSIS — M79641 Pain in right hand: Secondary | ICD-10-CM | POA: Diagnosis not present

## 2015-01-22 DIAGNOSIS — J301 Allergic rhinitis due to pollen: Secondary | ICD-10-CM | POA: Diagnosis not present

## 2015-01-22 DIAGNOSIS — G894 Chronic pain syndrome: Secondary | ICD-10-CM | POA: Diagnosis not present

## 2015-01-22 DIAGNOSIS — F32A Depression, unspecified: Secondary | ICD-10-CM

## 2015-01-22 DIAGNOSIS — B373 Candidiasis of vulva and vagina: Secondary | ICD-10-CM | POA: Diagnosis not present

## 2015-01-22 DIAGNOSIS — E113599 Type 2 diabetes mellitus with proliferative diabetic retinopathy without macular edema, unspecified eye: Secondary | ICD-10-CM

## 2015-01-22 DIAGNOSIS — E1142 Type 2 diabetes mellitus with diabetic polyneuropathy: Secondary | ICD-10-CM

## 2015-01-22 DIAGNOSIS — M5136 Other intervertebral disc degeneration, lumbar region: Secondary | ICD-10-CM

## 2015-01-22 DIAGNOSIS — E785 Hyperlipidemia, unspecified: Secondary | ICD-10-CM | POA: Diagnosis not present

## 2015-01-22 DIAGNOSIS — N183 Chronic kidney disease, stage 3 unspecified: Secondary | ICD-10-CM

## 2015-01-22 DIAGNOSIS — T07XXXA Unspecified multiple injuries, initial encounter: Secondary | ICD-10-CM

## 2015-01-22 DIAGNOSIS — I10 Essential (primary) hypertension: Secondary | ICD-10-CM | POA: Diagnosis not present

## 2015-01-22 DIAGNOSIS — E11359 Type 2 diabetes mellitus with proliferative diabetic retinopathy without macular edema: Secondary | ICD-10-CM

## 2015-01-22 DIAGNOSIS — F329 Major depressive disorder, single episode, unspecified: Secondary | ICD-10-CM

## 2015-01-22 DIAGNOSIS — L089 Local infection of the skin and subcutaneous tissue, unspecified: Secondary | ICD-10-CM

## 2015-01-22 DIAGNOSIS — D509 Iron deficiency anemia, unspecified: Secondary | ICD-10-CM

## 2015-01-22 DIAGNOSIS — N189 Chronic kidney disease, unspecified: Secondary | ICD-10-CM | POA: Insufficient documentation

## 2015-01-22 DIAGNOSIS — B3731 Acute candidiasis of vulva and vagina: Secondary | ICD-10-CM

## 2015-01-22 DIAGNOSIS — M79642 Pain in left hand: Secondary | ICD-10-CM

## 2015-01-22 LAB — CBC WITH DIFFERENTIAL/PLATELET
BASOS ABS: 0 10*3/uL (ref 0.0–0.1)
Basophils Relative: 0 % (ref 0–1)
EOS PCT: 6 % — AB (ref 0–5)
Eosinophils Absolute: 0.4 10*3/uL (ref 0.0–0.7)
HCT: 35.2 % — ABNORMAL LOW (ref 36.0–46.0)
Hemoglobin: 11.6 g/dL — ABNORMAL LOW (ref 12.0–15.0)
LYMPHS ABS: 1.8 10*3/uL (ref 0.7–4.0)
LYMPHS PCT: 26 % (ref 12–46)
MCH: 29.2 pg (ref 26.0–34.0)
MCHC: 33 g/dL (ref 30.0–36.0)
MCV: 88.7 fL (ref 78.0–100.0)
MPV: 9.2 fL (ref 8.6–12.4)
Monocytes Absolute: 0.5 10*3/uL (ref 0.1–1.0)
Monocytes Relative: 8 % (ref 3–12)
Neutro Abs: 4.1 10*3/uL (ref 1.7–7.7)
Neutrophils Relative %: 60 % (ref 43–77)
PLATELETS: 277 10*3/uL (ref 150–400)
RBC: 3.97 MIL/uL (ref 3.87–5.11)
RDW: 13.8 % (ref 11.5–15.5)
WBC: 6.8 10*3/uL (ref 4.0–10.5)

## 2015-01-22 LAB — COMPREHENSIVE METABOLIC PANEL
ALBUMIN: 3.8 g/dL (ref 3.5–5.2)
ALK PHOS: 88 U/L (ref 39–117)
ALT: 14 U/L (ref 0–35)
AST: 17 U/L (ref 0–37)
BILIRUBIN TOTAL: 0.6 mg/dL (ref 0.2–1.2)
BUN: 23 mg/dL (ref 6–23)
CO2: 27 mEq/L (ref 19–32)
Calcium: 9.4 mg/dL (ref 8.4–10.5)
Chloride: 104 mEq/L (ref 96–112)
Creat: 1.19 mg/dL — ABNORMAL HIGH (ref 0.50–1.10)
GLUCOSE: 67 mg/dL — AB (ref 70–99)
Potassium: 4.7 mEq/L (ref 3.5–5.3)
Sodium: 139 mEq/L (ref 135–145)
Total Protein: 7.1 g/dL (ref 6.0–8.3)

## 2015-01-22 LAB — LIPID PANEL
CHOLESTEROL: 231 mg/dL — AB (ref 0–200)
HDL: 41 mg/dL — AB (ref >=46)
LDL Cholesterol: 160 mg/dL — ABNORMAL HIGH (ref 0–99)
TRIGLYCERIDES: 151 mg/dL — AB (ref <150)
Total CHOL/HDL Ratio: 5.6 Ratio
VLDL: 30 mg/dL (ref 0–40)

## 2015-01-22 LAB — POCT GLYCOSYLATED HEMOGLOBIN (HGB A1C): HEMOGLOBIN A1C: 6.4

## 2015-01-22 MED ORDER — HYDROCODONE-ACETAMINOPHEN 10-325 MG PO TABS: 1.0000 | ORAL_TABLET | Freq: Three times a day (TID) | ORAL | Status: DC | PRN

## 2015-01-22 MED ORDER — INSULIN LISPRO 100 UNIT/ML ~~LOC~~ SOLN: 6.0000 [IU] | Freq: Two times a day (BID) | SUBCUTANEOUS | Status: DC

## 2015-01-22 MED ORDER — FLUCONAZOLE 100 MG PO TABS
ORAL_TABLET | ORAL | Status: DC
Start: 2015-01-22 — End: 2015-04-23

## 2015-01-22 MED ORDER — CLONAZEPAM 0.5 MG PO TABS: 0.5000 mg | ORAL_TABLET | Freq: Two times a day (BID) | ORAL | Status: DC | PRN

## 2015-01-22 MED ORDER — IPRATROPIUM BROMIDE 0.03 % NA SOLN: 2.0000 | Freq: Two times a day (BID) | NASAL | Status: DC

## 2015-01-22 MED ORDER — CEPHALEXIN 500 MG PO CAPS: 500.0000 mg | ORAL_CAPSULE | Freq: Three times a day (TID) | ORAL | Status: DC

## 2015-01-22 MED ORDER — HYDROCODONE-ACETAMINOPHEN 10-325 MG PO TABS
1.0000 | ORAL_TABLET | Freq: Three times a day (TID) | ORAL | Status: DC | PRN
Start: 2015-01-22 — End: 2015-01-22

## 2015-01-22 MED ORDER — HYDROCODONE-ACETAMINOPHEN 10-325 MG PO TABS
1.0000 | ORAL_TABLET | Freq: Three times a day (TID) | ORAL | Status: DC | PRN
Start: 2015-01-22 — End: 2015-04-23

## 2015-01-22 NOTE — Telephone Encounter (Signed)
Called prescription to [facility] [address] (voice mail) for clonazepam 0.5 mg, per Dr Tamala Julian.

## 2015-01-22 NOTE — Progress Notes (Signed)
Subjective:    Patient ID: Angel French, female    DOB: 01/29/1951, 64 y.o.   MRN: [id number]  01/22/2015  Follow-up; Depression; Diabetes; Hyperlipidemia; and Back Pain   HPI This 64 y.o. female presents for three month follow-up:  1. DMII:  HgbA1c 7.4 in 10/2014. Management changes made at last visit included switching Lantus to Levimir due to insurance.  Patient reports good compliance with medication, good tolerance to medication, and good symptom control.  Checking sugars daily; this morning sugar was 73; previous days will start out with good sugar and then worsens.  Levimir 30 units bid.  Having highs between lunch and dinner.  Would like Novolog or Humalog for meal time coverage.   2. Hyperlipidemia: No changes to management made at last visit.  Patient reports good compliance with medication, good tolerance to medication, and good symptom control.    3.  Recurrent candidiasis:  Needs refill of Diflucan due to recurrent nature of infection.   4. Anxiety and depression:  Increased Prozac to 14m daily. Granddaughter is doing better with mother's regular schedule. Overwhelmed; no SI/HI.  Previous anxiety medications.  Major stressors; spouse with recent kidney transplant with rejection within first two months of receiving kidney followed by MRSA urosepsis; [facility] [place] [place]; major financial strains.  No SI/HI.      5.  Chronic pain syndrome for DDD lumbar spine: taking Hydrocodone tid now due to worsening hand pain.  Does not control pain currently.  Hands are real problem.  Cannot do anything with them. Lower back pain is at baseline.  No recent ortho consultation; refuses injection or surgery of lower back due to potential complications.     6. Iron deficiency anemia:  Stable; presenting for repeat labs.  Chronic issue.    7. OA hands B:  Referred to ortho at last visit but unable to keep appointment due to spouse's kidney transplant.    Cannot  take NSAIDs due to renal insufficiency.  Needing Hydrocodone tid.   Voltaren gel helps.  Unable to zip pants; has difficulties grasping items.  Diffuse pain.    8. Renal insufficiency:  Creatinine 1.49 in 10/2014.   Followed by nephrology.  9. Allergic Rhinitis: requesting refill of Atrovent nasal spray; starting to suffer with nasal congestion and rhinorrhea.  10.  Abrasions from dog multiple sites: skin is slow to heal; wounds are very tender and developing redness around wounds; no drainage; no odor.       Review of Systems  Constitutional: Negative for fever, chills, diaphoresis and fatigue.  Eyes: Negative for visual disturbance.  Respiratory: Negative for cough and shortness of breath.   Cardiovascular: Negative for chest pain, palpitations and leg swelling.  Gastrointestinal: Negative for nausea, vomiting, abdominal pain, diarrhea and constipation.  Endocrine: Negative for cold intolerance, heat intolerance, polydipsia, polyphagia and polyuria.  Musculoskeletal: Positive for back pain, joint swelling, arthralgias and gait problem. Negative for neck pain and neck stiffness.  Skin: Negative for color change, pallor, rash and wound.  Neurological: Negative for dizziness, tremors, seizures, syncope, facial asymmetry, speech difficulty, weakness, light-headedness, numbness and headaches.  Psychiatric/Behavioral: Positive for dysphoric mood and decreased concentration. Negative for suicidal ideas, sleep disturbance and self-injury. The patient is nervous/anxious.     Past Medical History  Diagnosis Date  . Diabetes mellitus   . Neuromuscular disorder   . Anxiety   . Depression   . Allergy     generic allergy pill; Spring and Fall  only.  . Arthritis     DDD lumbar, R hip OA.  s/p ortho consult in past.  . Diabetic peripheral neuropathy associated with type 2 diabetes mellitus   . Blood transfusion without reported diagnosis     Mountain climbing accident in [place].  . Cataract       B retractions.  Marland Kitchen Ulcer     Peptic ulcer H. Pylori + s/p treatment.  Upper GI diagnosed.[name] Prilosec PRN .  Marland Kitchen Diabetic retinopathy associated with type 2 diabetes mellitus     s/p laser treatment multiple.  Unable to drive.  . Brachial plexus disorders   . Hypertension     controlled, off meds   . GERD (gastroesophageal reflux disease)   . Hyperlipidemia   . Chronic kidney disease     stage 3 per pt.   . Diabetic retinopathy    Past Surgical History  Procedure Laterality Date  . Cholecystectomy    . Tonsillectomy    . Carpal tunnel release      Bilateral.  . Behavioral helath admission      age 38; three months in [place].  . Cardiac catheterization  11/02/2007    normal coronary arteries.  . Abdominal hysterectomy  11/02/1979    DUB; cervical dysplasia; ovaries intact.  . Eye surgery      Cataracts B. Laser surgery x 7 for Diabetic Retinopathy  . Cataract extraction, bilateral    . Abdominal surgery      staph abcess    Allergies  Allergen Reactions  . Codeine Anaphylaxis  . Contrast Media [Iodinated Diagnostic Agents] Anaphylaxis  . Nitrofurantoin Monohyd Macro Anaphylaxis  . Iodine Hives  . Red Dye Itching  . Ultram [Tramadol Hcl] Nausea And Vomiting   Current Outpatient Prescriptions  Medication Sig Dispense Refill  . aspirin 325 MG tablet Take 325 mg by mouth 2 (two) times daily.      . Blood Glucose Monitoring Suppl (BLOOD GLUCOSE METER KIT AND SUPPLIES) KIT Dispense based on patient and insurance preference. Use up to four times daily as directed. (FOR ICD-9 250.00, 250.01). 1 each 11  . diclofenac sodium (VOLTAREN) 1 % GEL Apply 2 g topically 4 (four) times daily. 100 g 3  . fluconazole (DIFLUCAN) 100 MG tablet Two tablets daily x 1 day then one tablet daily x 6 days 8 tablet 0  . FLUoxetine (PROZAC) 20 MG tablet Take 3 tablets (60 mg total) by mouth daily. 90 tablet 5  . furosemide (LASIX) 20 MG tablet Take 1 tablet (20 mg total) by mouth daily. 30  tablet 11  . HYDROcodone-acetaminophen (NORCO) 10-325 MG per tablet Take 1 tablet by mouth 3 (three) times daily as needed. 90 tablet 0  . insulin detemir (LEVEMIR) 100 unit/ml SOLN Inject 0.6 mLs (60 Units total) into the skin at bedtime. 10 mL 11  . Insulin Syringes, Disposable, U-100 0.5 ML MISC 28 Units by Does not apply route 2 (two) times daily. 100 each 11  . Needles & Syringes MISC 1 Syringe by Does not apply route 2 (two) times daily. 100 each 11  . nystatin cream (MYCOSTATIN) Apply 1 application topically 2 (two) times daily. 90 g 5  . omeprazole (PRILOSEC) 40 MG capsule Take 1 capsule (40 mg total) by mouth daily. 30 capsule 11  . simvastatin (ZOCOR) 20 MG tablet Take 1 tablet (20 mg total) by mouth at bedtime. 30 tablet 11  . traZODone (DESYREL) 100 MG tablet Take 2 tablets (200 mg  total) by mouth at bedtime. 60 tablet 5  . cephALEXin (KEFLEX) 500 MG capsule Take 1 capsule (500 mg total) by mouth 3 (three) times daily. 21 capsule 0  . clonazePAM (KLONOPIN) 0.5 MG tablet Take 1 tablet (0.5 mg total) by mouth 2 (two) times daily as needed for anxiety. 60 tablet 2  . insulin lispro (HUMALOG) 100 UNIT/ML injection Inject 0.06-0.12 mLs (6-12 Units total) into the skin 2 (two) times daily with a meal. 10 mL 11  . ipratropium (ATROVENT) 0.03 % nasal spray Place 2 sprays into the nose 2 (two) times daily. 30 mL 4   No current facility-administered medications for this visit.       Objective:    BP 130/70 mmHg  Pulse 64  Temp(Src) 97.5 F (36.4 C) (Oral)  Resp 16  Ht 5' 2.4" (1.585 m)  Wt 232 lb 12.8 oz (105.597 kg)  BMI 42.03 kg/m2  SpO2 100% Physical Exam  Constitutional: She is oriented to person, place, and time. She appears well-developed and well-nourished. No distress.  obese  HENT:  Head: Normocephalic and atraumatic.  Right Ear: External ear normal.  Left Ear: External ear normal.  Nose: Nose normal.  Mouth/Throat: Oropharynx is clear and moist.  Eyes: Conjunctivae  and EOM are normal. Pupils are equal, round, and reactive to light.  Neck: Normal range of motion. Neck supple. Carotid bruit is not present. No thyromegaly present.  Cardiovascular: Normal rate, regular rhythm, normal heart sounds and intact distal pulses.  Exam reveals no gallop and no friction rub.   No murmur heard. Pulmonary/Chest: Effort normal and breath sounds normal. She has no wheezes. She has no rales.  Abdominal: Soft. Bowel sounds are normal. She exhibits no distension and no mass. There is no tenderness. There is no rebound and no guarding.  Musculoskeletal:       Right hand: She exhibits decreased range of motion, tenderness, bony tenderness and swelling. Normal sensation noted. Decreased strength noted.       Left hand: She exhibits decreased range of motion, tenderness, bony tenderness and swelling. Normal sensation noted. Decreased strength noted.  Lymphadenopathy:    She has no cervical adenopathy.  Neurological: She is alert and oriented to person, place, and time. No cranial nerve deficit.  Skin: Skin is warm and dry. No rash noted. She is not diaphoretic. There is erythema. No pallor.  Scattered superficial abrasions with surrounding erythema B upper forearms, legs, L upper lip.  Psychiatric: She has a normal mood and affect. Her behavior is normal. Judgment and thought content normal.        Assessment & Plan:   1. Type 2 diabetes mellitus with diabetic polyneuropathy   2. Essential hypertension, benign   3. Hyperlipidemia   4. Anemia, iron deficiency   5. 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 Depression   7. Degenerative disc disease, lumbar   8. Chronic pain syndrome   9. Chronic renal insufficiency, stage 3 (moderate)   10. Bilateral hand pain   11. Infected abrasions of multiple sites   12. Allergic rhinitis due to pollen   13. Recurrent candidiasis of vagina     1.  DMII: improved control;  obtain labs; continue Lantus; rx for Humalog provided to use prior to meals. 2.  HTN: stable off of medication at this time.  Needs ACE or ARB; add next visit. 3.  Hyperlipidemia: controlled; obtain labs; continue current medications. 4.  Iron deficiency anemia:  stable; obtain labs. 5.  Diabetic retinopathy; stable; followed by ophthalmology every six months. 6.  Depression with anxiety: worsening due to major stressors family; continue Prozac 91m daily; add Clonazepam 0.546mbid PRN.  7.  DDD lumbar: stable; refill of Hydrocodone provided; using tid. 8.  B hand pain: Persistent; worsened when stopped daily NSAID due to renal insufficiency; refer to ortho and rheumatology for further management. 9.  Renal insufficiency: stable; obtain labs; warrants ACE-I or ARB; add at next visit; followed by nephrology. 10. Recurrent abrasions with mild infection: New. Secondary to scratches from dog; rx for Keflex provided. 11.  Recurrent candidiasis: Stable; refill of Diflucan provided. 12. Allergic Rhinitis: worsening; rx for Atrovent nasal spray provided.   Meds ordered this encounter  Medications  . clonazePAM (KLONOPIN) 0.5 MG tablet    Sig: Take 1 tablet (0.5 mg total) by mouth 2 (two) times daily as needed for anxiety.    Dispense:  60 tablet    Refill:  2  . fluconazole (DIFLUCAN) 100 MG tablet    Sig: Two tablets daily x 1 day then one tablet daily x 6 days    Dispense:  8 tablet    Refill:  0  . ipratropium (ATROVENT) 0.03 % nasal spray    Sig: Place 2 sprays into the nose 2 (two) times daily.    Dispense:  30 mL    Refill:  4  . DISCONTD: HYDROcodone-acetaminophen (NORCO) 10-325 MG per tablet    Sig: Take 1 tablet by mouth 3 (three) times daily as needed.    Dispense:  90 tablet    Refill:  0    DO NOT FILL UNTIL 01/31/2015.  . Marland KitchenISCONTD: HYDROcodone-acetaminophen (NORCO) 10-325 MG per tablet    Sig: Take 1 tablet by mouth 3 (three) times daily as needed.    Dispense:  90 tablet     Refill:  0    DO NOT FILL UNTIL 03/02/2015.  . Marland KitchenYDROcodone-acetaminophen (NORCO) 10-325 MG per tablet    Sig: Take 1 tablet by mouth 3 (three) times daily as needed.    Dispense:  90 tablet    Refill:  0    DO NOT FILL UNTIL 04/02/2015.  . cephALEXin (KEFLEX) 500 MG capsule    Sig: Take 1 capsule (500 mg total) by mouth 3 (three) times daily.    Dispense:  21 capsule    Refill:  0  . insulin lispro (HUMALOG) 100 UNIT/ML injection    Sig: Inject 0.06-0.12 mLs (6-12 Units total) into the skin 2 (two) times daily with a meal.    Dispense:  10 mL    Refill:  11    Return in about 3 months (around 04/24/2015) for recheck.      MaElayne GuerinM.D. [facility]  [phone] [phone]

## 2015-01-31 ENCOUNTER — Encounter: Payer: Self-pay | Admitting: Family Medicine

## 2015-02-12 MED ORDER — INSULIN DETEMIR 100 UNIT/ML FLEXPEN: 60.0000 [IU] | Freq: Every day | SUBCUTANEOUS | Status: DC

## 2015-02-25 ENCOUNTER — Other Ambulatory Visit: Payer: Self-pay | Admitting: Family Medicine

## 2015-02-25 MED ORDER — INSULIN DETEMIR 100 UNIT/ML FLEXPEN: 60.0000 [IU] | Freq: Every day | SUBCUTANEOUS | Status: DC

## 2015-04-23 ENCOUNTER — Encounter: Payer: Self-pay | Admitting: Family Medicine

## 2015-04-23 ENCOUNTER — Ambulatory Visit (INDEPENDENT_AMBULATORY_CARE_PROVIDER_SITE_OTHER): Payer: 59 | Admitting: Family Medicine

## 2015-04-23 VITALS — BP 144/60 | HR 84 | Temp 98.0°F | Resp 18 | Ht 62.0 in | Wt 237.4 lb

## 2015-04-23 DIAGNOSIS — G894 Chronic pain syndrome: Secondary | ICD-10-CM | POA: Diagnosis not present

## 2015-04-23 DIAGNOSIS — D509 Iron deficiency anemia, unspecified: Secondary | ICD-10-CM

## 2015-04-23 DIAGNOSIS — E7800 Pure hypercholesterolemia, unspecified: Secondary | ICD-10-CM

## 2015-04-23 DIAGNOSIS — R3915 Urgency of urination: Secondary | ICD-10-CM | POA: Diagnosis not present

## 2015-04-23 DIAGNOSIS — N183 Chronic kidney disease, stage 3 unspecified: Secondary | ICD-10-CM

## 2015-04-23 DIAGNOSIS — E669 Obesity, unspecified: Secondary | ICD-10-CM | POA: Diagnosis not present

## 2015-04-23 DIAGNOSIS — G629 Polyneuropathy, unspecified: Secondary | ICD-10-CM | POA: Diagnosis not present

## 2015-04-23 DIAGNOSIS — E1142 Type 2 diabetes mellitus with diabetic polyneuropathy: Secondary | ICD-10-CM

## 2015-04-23 DIAGNOSIS — M5136 Other intervertebral disc degeneration, lumbar region: Secondary | ICD-10-CM | POA: Diagnosis not present

## 2015-04-23 LAB — COMPREHENSIVE METABOLIC PANEL
ALK PHOS: 77 U/L (ref 39–117)
ALT: 14 U/L (ref 0–35)
AST: 17 U/L (ref 0–37)
Albumin: 3.9 g/dL (ref 3.5–5.2)
BILIRUBIN TOTAL: 0.7 mg/dL (ref 0.2–1.2)
BUN: 31 mg/dL — ABNORMAL HIGH (ref 6–23)
CO2: 30 mEq/L (ref 19–32)
Calcium: 9.2 mg/dL (ref 8.4–10.5)
Chloride: 99 mEq/L (ref 96–112)
Creat: 1.59 mg/dL — ABNORMAL HIGH (ref 0.50–1.10)
Glucose, Bld: 43 mg/dL — CL (ref 70–99)
POTASSIUM: 3.6 meq/L (ref 3.5–5.3)
SODIUM: 143 meq/L (ref 135–145)
Total Protein: 6.8 g/dL (ref 6.0–8.3)

## 2015-04-23 LAB — CBC WITH DIFFERENTIAL/PLATELET
Basophils Absolute: 0 10*3/uL (ref 0.0–0.1)
Basophils Relative: 0 % (ref 0–1)
Eosinophils Absolute: 0.3 10*3/uL (ref 0.0–0.7)
Eosinophils Relative: 3 % (ref 0–5)
HEMATOCRIT: 36.1 % (ref 36.0–46.0)
HEMOGLOBIN: 11.9 g/dL — AB (ref 12.0–15.0)
LYMPHS ABS: 2.5 10*3/uL (ref 0.7–4.0)
Lymphocytes Relative: 26 % (ref 12–46)
MCH: 29.3 pg (ref 26.0–34.0)
MCHC: 33 g/dL (ref 30.0–36.0)
MCV: 88.9 fL (ref 78.0–100.0)
MONO ABS: 0.8 10*3/uL (ref 0.1–1.0)
MONOS PCT: 8 % (ref 3–12)
MPV: 9.7 fL (ref 8.6–12.4)
NEUTROS ABS: 6.2 10*3/uL (ref 1.7–7.7)
NEUTROS PCT: 63 % (ref 43–77)
Platelets: 273 10*3/uL (ref 150–400)
RBC: 4.06 MIL/uL (ref 3.87–5.11)
RDW: 14.3 % (ref 11.5–15.5)
WBC: 9.8 10*3/uL (ref 4.0–10.5)

## 2015-04-23 LAB — POCT URINALYSIS DIPSTICK
Bilirubin, UA: NEGATIVE
Glucose, UA: NEGATIVE
Ketones, UA: NEGATIVE
Nitrite, UA: NEGATIVE
PROTEIN UA: 100
SPEC GRAV UA: 1.02
Urobilinogen, UA: 0.2
pH, UA: 6

## 2015-04-23 LAB — LIPID PANEL
CHOL/HDL RATIO: 3.9 ratio
Cholesterol: 189 mg/dL (ref 0–200)
HDL: 49 mg/dL (ref >=46)
LDL Cholesterol: 111 mg/dL — ABNORMAL HIGH (ref 0–99)
Triglycerides: 144 mg/dL (ref <150)
VLDL: 29 mg/dL (ref 0–40)

## 2015-04-23 LAB — POCT UA - MICROSCOPIC ONLY
Casts, Ur, LPF, POC: NEGATIVE
Crystals, Ur, HPF, POC: NEGATIVE
Epithelial cells, urine per micros: NEGATIVE
Mucus, UA: NEGATIVE
RBC, urine, microscopic: NEGATIVE
Yeast, UA: NEGATIVE

## 2015-04-23 LAB — IRON: IRON: 68 ug/dL (ref 42–145)

## 2015-04-23 LAB — POCT GLYCOSYLATED HEMOGLOBIN (HGB A1C): Hemoglobin A1C: 8.1

## 2015-04-23 MED ORDER — FLUCONAZOLE 100 MG PO TABS: ORAL_TABLET | ORAL | Status: DC

## 2015-04-23 MED ORDER — HYDROCODONE-ACETAMINOPHEN 10-325 MG PO TABS: 1.0000 | ORAL_TABLET | Freq: Four times a day (QID) | ORAL | Status: DC | PRN

## 2015-04-23 NOTE — Progress Notes (Signed)
Subjective:    Patient ID: Angel French, female    DOB: 09-05-1951, 64 y.o.   MRN: [id number]  04/23/2015  Diabetes; Hypertension; and Urinary Urgency   HPI This 64 y.o. female presents for three month follow-up:  1. DMII:  Added Humalog last visit for mealtime coverage.  Sugars have been eradic; running in 300s.  Taking 30 units bid of Levemir.  Taking 65m units humalog.  Probably should take more.    2.  Hyperlipidemia:  Cholesterol was really high at last visit.    3.  Anxiety and depression:  Added Clonazepam 0.5mg  bid PRN.  Doing better; quit taking Clonazepam; took for one week and then quit.  Taking Prozac 40mg  daily several weeks ago decreased.  No SI/HI.    4. Chronic lower back pain:. Taking Hydrocodone tid.  Back felt great on Prednisone; walked everywhere in [place].  Took prednisone for six weeks.  Taking pain medication tid without question; sometimes taking a fourth one; taking fourth tablet more frequently.  2-3 times per week.    5.  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  Cannot take minerals; when took iv iron but was horribly intolerant to it.  Mouth is burning and hot.  B third day after MTX, pt was sick.  Cannot tolerate folic acid.  Has not taken MTX.  Has made appointment to return but [facility] bus would not take pt because forgot booster seat for granddaughter.  Must reschedule.  Cannot take Prednisone long term.  Prednisone for six weeks; has gained 5 pounds.  Stopped Prednisone one week ago.    6. Renal insufficiency:  Followed only once by nephrology.    7.  Urinary frequency: onset two days ago.     Review of Systems  Constitutional: Negative for fever, chills, diaphoresis and fatigue.  Eyes: Negative for visual disturbance.  Respiratory: Negative for cough and shortness of breath.   Cardiovascular: Negative for chest  pain, palpitations and leg swelling.  Gastrointestinal: Negative for nausea, vomiting, abdominal pain, diarrhea and constipation.  Endocrine: Negative for cold intolerance, heat intolerance, polydipsia, polyphagia and polyuria.  Neurological: Negative for dizziness, tremors, seizures, syncope, facial asymmetry, speech difficulty, weakness, light-headedness, numbness and headaches.    Past Medical History  Diagnosis Date  . Diabetes mellitus   . Neuromuscular disorder   . Anxiety   . Depression   . Allergy     generic allergy pill; Spring and Fall only.  . Arthritis     DDD lumbar, R hip OA.  s/p ortho consult in past.  . Diabetic peripheral neuropathy associated with type 2 diabetes mellitus   . Blood transfusion without reported diagnosis     Mountain climbing accident in [place].  . Cataract     B retractions.  Marland Kitchen Ulcer     Peptic ulcer H. Pylori + s/p treatment.  Upper GI diagnosed.[name] Prilosec PRN .  Marland Kitchen Diabetic retinopathy associated with type 2 diabetes mellitus     s/p laser treatment multiple.  Unable to drive.  . Brachial plexus disorders   . Hypertension     controlled, off meds   . GERD (gastroesophageal reflux disease)   . Hyperlipidemia   . Chronic kidney disease     stage 3 per pt.   . Diabetic retinopathy    Past Surgical History  Procedure Laterality Date  . Cholecystectomy    .  Tonsillectomy    . Carpal tunnel release      Bilateral.  . Behavioral helath admission      age 3; three months in [place].  . Cardiac catheterization  11/02/2007    normal coronary arteries.  . Abdominal hysterectomy  11/02/1979    DUB; cervical dysplasia; ovaries intact.  . Eye surgery      Cataracts B. Laser surgery x 7 for Diabetic Retinopathy  . Cataract extraction, bilateral    . Abdominal surgery      staph abcess    Allergies  Allergen Reactions  . Codeine Anaphylaxis  . Contrast Media [Iodinated Diagnostic Agents] Anaphylaxis  . Nitrofurantoin Monohyd Macro  Anaphylaxis  . Iodine Hives  . Red Dye Itching  . Ultram [Tramadol Hcl] Nausea And Vomiting   Current Outpatient Prescriptions  Medication Sig Dispense Refill  . aspirin 325 MG tablet Take 325 mg by mouth 2 (two) times daily.      . Blood Glucose Monitoring Suppl (BLOOD GLUCOSE METER KIT AND SUPPLIES) KIT Dispense based on patient and insurance preference. Use up to four times daily as directed. (FOR ICD-9 250.00, 250.01). 1 each 11  . diclofenac sodium (VOLTAREN) 1 % GEL Apply 2 g topically 4 (four) times daily. 100 g 3  . fluconazole (DIFLUCAN) 100 MG tablet Two tablets daily x 1 day then one tablet daily x 6 days 8 tablet 0  . FLUoxetine (PROZAC) 20 MG tablet Take 3 tablets (60 mg total) by mouth daily. 90 tablet 5  . furosemide (LASIX) 20 MG tablet Take 1 tablet (20 mg total) by mouth daily. 30 tablet 11  . HYDROcodone-acetaminophen (NORCO) 10-325 MG per tablet Take 1 tablet by mouth every 6 (six) hours as needed. 100 tablet 0  . insulin detemir (LEVEMIR) 100 unit/ml SOLN Inject 0.6 mLs (60 Units total) into the skin at bedtime. 20 mL 11  . insulin lispro (HUMALOG) 100 UNIT/ML injection Inject 0.06-0.12 mLs (6-12 Units total) into the skin 2 (two) times daily with a meal. 10 mL 11  . Insulin Syringes, Disposable, U-100 0.5 ML MISC 28 Units by Does not apply route 2 (two) times daily. 100 each 11  . ipratropium (ATROVENT) 0.03 % nasal spray Place 2 sprays into the nose 2 (two) times daily. 30 mL 4  . Needles & Syringes MISC 1 Syringe by Does not apply route 2 (two) times daily. 100 each 11  . nystatin cream (MYCOSTATIN) Apply 1 application topically 2 (two) times daily. 90 g 5  . omeprazole (PRILOSEC) 40 MG capsule Take 1 capsule (40 mg total) by mouth daily. 30 capsule 11  . simvastatin (ZOCOR) 20 MG tablet Take 1 tablet (20 mg total) by mouth at bedtime. 30 tablet 11  . traZODone (DESYREL) 100 MG tablet Take 2 tablets (200 mg total) by mouth at bedtime. 60 tablet 5  . cephALEXin (KEFLEX)  500 MG capsule Take 1 capsule (500 mg total) by mouth 3 (three) times daily. 21 capsule 0   No current facility-administered medications for this visit.       Objective:    BP 144/60 mmHg  Pulse 84  Temp(Src) 98 F (36.7 C) (Oral)  Resp 18  Ht $R'5\' 2"'xH$  (1.575 m)  Wt 237 lb 6.4 oz (107.684 kg)  BMI 43.41 kg/m2  SpO2 96% Physical Exam  Constitutional: She is oriented to person, place, and time. She appears well-developed and well-nourished. No distress.  HENT:  Head: Normocephalic and atraumatic.  Right Ear: External ear  normal.  Left Ear: External ear normal.  Nose: Nose normal.  Mouth/Throat: Oropharynx is clear and moist.  Eyes: Conjunctivae and EOM are normal. Pupils are equal, round, and reactive to light.  Neck: Normal range of motion. Neck supple. Carotid bruit is not present. No thyromegaly present.  Cardiovascular: Normal rate, regular rhythm, normal heart sounds and intact distal pulses.  Exam reveals no gallop and no friction rub.   No murmur heard. Pulmonary/Chest: Effort normal and breath sounds normal. She has no wheezes. She has no rales.  Abdominal: Soft. Bowel sounds are normal. She exhibits no distension and no mass. There is no tenderness. There is no rebound and no guarding.  Lymphadenopathy:    She has no cervical adenopathy.  Neurological: She is alert and oriented to person, place, and time. No cranial nerve deficit.  Skin: Skin is warm and dry. No rash noted. She is not diaphoretic. No erythema. No pallor.  Psychiatric: She has a normal mood and affect. Her behavior is normal.   Results for orders placed or performed in visit on 04/23/15  Urine culture  Result Value Ref Range   Culture ESCHERICHIA COLI    Colony Count >=100,000 COLONIES/ML    Organism ID, Bacteria ESCHERICHIA COLI       Susceptibility   Escherichia coli -  (no method available)    AMPICILLIN 4 Sensitive     AMOX/CLAVULANIC <=2 Sensitive     AMPICILLIN/SULBACTAM <=2 Sensitive      PIP/TAZO <=4 Sensitive     IMIPENEM <=0.25 Sensitive     CEFAZOLIN <=4 Sensitive     CEFTRIAXONE <=1 Sensitive     CEFTAZIDIME <=1 Sensitive     CEFEPIME <=1 Sensitive     GENTAMICIN <=1 Sensitive     TOBRAMYCIN <=1 Sensitive     CIPROFLOXACIN <=0.25 Sensitive     LEVOFLOXACIN <=0.12 Sensitive     NITROFURANTOIN <=16 Sensitive     TRIMETH/SULFA <=20 Sensitive   CBC with Differential/Platelet  Result Value Ref Range   WBC 9.8 4.0 - 10.5 K/uL   RBC 4.06 3.87 - 5.11 MIL/uL   Hemoglobin 11.9 (L) 12.0 - 15.0 g/dL   HCT 36.1 36.0 - 46.0 %   MCV 88.9 78.0 - 100.0 fL   MCH 29.3 26.0 - 34.0 pg   MCHC 33.0 30.0 - 36.0 g/dL   RDW 14.3 11.5 - 15.5 %   Platelets 273 150 - 400 K/uL   MPV 9.7 8.6 - 12.4 fL   Neutrophils Relative % 63 43 - 77 %   Neutro Abs 6.2 1.7 - 7.7 K/uL   Lymphocytes Relative 26 12 - 46 %   Lymphs Abs 2.5 0.7 - 4.0 K/uL   Monocytes Relative 8 3 - 12 %   Monocytes Absolute 0.8 0.1 - 1.0 K/uL   Eosinophils Relative 3 0 - 5 %   Eosinophils Absolute 0.3 0.0 - 0.7 K/uL   Basophils Relative 0 0 - 1 %   Basophils Absolute 0.0 0.0 - 0.1 K/uL   Smear Review Criteria for review not met   Comprehensive metabolic panel  Result Value Ref Range   Sodium 143 135 - 145 mEq/L   Potassium 3.6 3.5 - 5.3 mEq/L   Chloride 99 96 - 112 mEq/L   CO2 30 19 - 32 mEq/L   Glucose, Bld 43 (LL) 70 - 99 mg/dL   BUN 31 (H) 6 - 23 mg/dL   Creat 1.59 (H) 0.50 - 1.10 mg/dL   Total Bilirubin 0.7  0.2 - 1.2 mg/dL   Alkaline Phosphatase 77 39 - 117 U/L   AST 17 0 - 37 U/L   ALT 14 0 - 35 U/L   Total Protein 6.8 6.0 - 8.3 g/dL   Albumin 3.9 3.5 - 5.2 g/dL   Calcium 9.2 8.4 - 10.5 mg/dL  Lipid panel  Result Value Ref Range   Cholesterol 189 0 - 200 mg/dL   Triglycerides 144 <150 mg/dL   HDL 49 >=46 mg/dL   Total CHOL/HDL Ratio 3.9 Ratio   VLDL 29 0 - 40 mg/dL   LDL Cholesterol 111 (H) 0 - 99 mg/dL  Iron  Result Value Ref Range   Iron 68 42 - 145 ug/dL  POCT glycosylated hemoglobin (Hb A1C)   Result Value Ref Range   Hemoglobin A1C 8.1   POCT urinalysis dipstick  Result Value Ref Range   Color, UA Yellow    Clarity, UA Cloudy    Glucose, UA Negative    Bilirubin, UA Negative    Ketones, UA Negative    Spec Grav, UA 1.020    Blood, UA Moderate    pH, UA 6.0    Protein, UA 100    Urobilinogen, UA 0.2    Nitrite, UA Negative    Leukocytes, UA large (3+) (A) Negative  POCT UA - Microscopic Only  Result Value Ref Range   WBC, Ur, HPF, POC TNTC    RBC, urine, microscopic Negative    Bacteria, U Microscopic 4+    Mucus, UA Negative    Epithelial cells, urine per micros Negative    Crystals, Ur, HPF, POC Negative    Casts, Ur, LPF, POC Negative    Yeast, UA Negative        Assessment & Plan:   1. Pure hypercholesterolemia   2. Obesity   3. Diabetic peripheral neuropathy associated with type 2 diabetes mellitus   4. Degenerative disc disease, lumbar   5. Chronic renal insufficiency, stage 3 (moderate)   6. Chronic pain syndrome   7. Anemia, iron deficiency   8. Urinary urgency     Meds ordered this encounter  Medications  . DISCONTD: HYDROcodone-acetaminophen (NORCO) 10-325 MG per tablet    Sig: Take 1 tablet by mouth every 6 (six) hours as needed.    Dispense:  100 tablet    Refill:  0    DO NOT FILL UNTIL 05/02/2015.  Marland Kitchen DISCONTD: HYDROcodone-acetaminophen (NORCO) 10-325 MG per tablet    Sig: Take 1 tablet by mouth every 6 (six) hours as needed.    Dispense:  100 tablet    Refill:  0    DO NOT FILL UNTIL 06/02/2015.  Marland Kitchen HYDROcodone-acetaminophen (NORCO) 10-325 MG per tablet    Sig: Take 1 tablet by mouth every 6 (six) hours as needed.    Dispense:  100 tablet    Refill:  0    DO NOT FILL UNTIL 07/03/2015.  . fluconazole (DIFLUCAN) 100 MG tablet    Sig: Two tablets daily x 1 day then one tablet daily x 6 days    Dispense:  8 tablet    Refill:  0  . cephALEXin (KEFLEX) 500 MG capsule    Sig: Take 1 capsule (500 mg total) by mouth 3 (three) times daily.     Dispense:  21 capsule    Refill:  0    Return in about 3 months (around 07/24/2015) for complete physical examiniation.     Bronc Brosseau Elayne Guerin, M.D. [facility] [address] [place], [place]  [address] [phone] phone [phone] fax

## 2015-04-24 ENCOUNTER — Telehealth: Payer: Self-pay | Admitting: Family Medicine

## 2015-04-24 NOTE — Telephone Encounter (Signed)
Call --- urine culture is not back yet.

## 2015-04-24 NOTE — Telephone Encounter (Signed)
Patient request review of lab results due to urinary symptoms and antibiotic sent to pharmacy. [facility] [address]. [phone]

## 2015-04-25 MED ORDER — CEPHALEXIN 500 MG PO CAPS: 500.0000 mg | ORAL_CAPSULE | Freq: Three times a day (TID) | ORAL | Status: DC

## 2015-04-25 NOTE — Telephone Encounter (Signed)
Call --- Urine culture is positive for UTI but sensitivities are not in yet. I have sent in Keflex 500mg  one tablet tid to pharmacy.

## 2015-04-25 NOTE — Telephone Encounter (Signed)
Dr. Tamala Julian results are in.

## 2015-04-25 NOTE — Telephone Encounter (Signed)
Spoke with pt, advised message from Dr. Tamala Julian. Pt understood.

## 2015-04-26 LAB — URINE CULTURE: Colony Count: >=100000

## 2015-05-08 ENCOUNTER — Telehealth: Payer: Self-pay | Admitting: Family Medicine

## 2015-05-08 NOTE — Telephone Encounter (Signed)
Patient request for Dr. Tamala Julian to complete a property tax form. Patient need the form completed before 05/15/15. Please call patient when form is complete [phone] I will place the for in the nurse's box at 102. Thanks.

## 2015-05-08 NOTE — Telephone Encounter (Signed)
I will put in your box today.

## 2015-05-10 ENCOUNTER — Encounter: Payer: Self-pay | Admitting: Family Medicine

## 2015-05-10 ENCOUNTER — Other Ambulatory Visit: Payer: Self-pay | Admitting: Family Medicine

## 2015-05-14 ENCOUNTER — Telehealth: Payer: Self-pay

## 2015-05-14 NOTE — Telephone Encounter (Signed)
PATIENT STATES SHE DROPPED OFF A "PROPERTY TAX FORM" FOR DR. Tamala Julian TO COMPLETE FOR HER. SHE DROPPED IT OFF ON Feb 04, 2023 AND TOLD us THAT HER DEAD LINE TO HAVE IT TURNED IN WAS THURS. July 14,TH. SHE HAS NOT HEARD BACK FROM [name]. SHE REALLY NEEDS TO PICK THIS FORM UP TODAY. PLEASE CALL HER TO LET HER KNOW THAT IT'S READY. BEST PHONE [phone] (CELL) [place]

## 2015-05-14 NOTE — Telephone Encounter (Signed)
Can someone review?

## 2015-05-14 NOTE — Telephone Encounter (Signed)
Can someone fill this out for Dr. Tamala Julian. She states she needs this tomorrow.

## 2015-05-14 NOTE — Telephone Encounter (Signed)
PATIENT IS REQUESTING DR. Tamala Julian GIVE HER A REFILL ON DIFLUCAN FOR HER YEAST INFECTION. BEST PHONE [phone] (CELL) [facility] ([facility] [place] MBC

## 2015-05-14 NOTE — Telephone Encounter (Signed)
Where is the form? If you get it to me Thursday am I will fill out for the patient.

## 2015-05-15 NOTE — Telephone Encounter (Signed)
I have the form, I tried to get Tishira and Dr. Lorelei Pont to fill it out but they were not comfortable with doing this so I left in Dr. Thompson Caul box. I tried to do everything I could for the pt. I spoke with pt to let her know what was going on. Pt understood.

## 2015-05-15 NOTE — Telephone Encounter (Signed)
Noted.  I will complete form upon my return.

## 2015-05-18 ENCOUNTER — Telehealth: Payer: Self-pay

## 2015-05-18 NOTE — Telephone Encounter (Signed)
Pt called to check on status of her form due to deadline.  Please call [phone] when its complete.

## 2015-05-19 NOTE — Telephone Encounter (Signed)
Dr. Tamala Julian I have these forms. I will bring them to 104.

## 2015-05-20 ENCOUNTER — Telehealth: Payer: Self-pay

## 2015-05-20 NOTE — Telephone Encounter (Signed)
Received fax from [facility] alerting [name] to a possible interaction between diflucan and simvastatin, higher risk of myopathy/rhabdomyolysis. Pt is on a 7 day course of diflucan and so is still taking it. I called pt and advised her of potential interaction. She reported that it is hard to tell if she has any more muscle aches than normal, but hadn't noticed a difference. I advised her to just hold off on taking her simvastatin while she is on the diflucan and then resume after finishing course. Pt agreed.

## 2015-05-24 ENCOUNTER — Telehealth: Payer: Self-pay | Admitting: Family Medicine

## 2015-05-24 NOTE — Telephone Encounter (Signed)
Patient is checking up on a form that she needed you to sign she states that she's been waiting for it for a while please respond so i can call patient

## 2015-05-25 NOTE — Telephone Encounter (Signed)
Call patient---I am reviewing the form she wants me to complete.  1. I do not determine total disability of patients.  2. Has she every applied for disability?  I have that she retired in 2008?

## 2015-05-26 NOTE — Telephone Encounter (Signed)
Spoke with pt, she is unable to work but has never applied. She states she recieves [employer]. She has to pay 6000 dollars in taxes and this is half of what she makes. She is trying to get a reverse mortgage. I tried to explain that you were still making your decision and will get back to her.

## 2015-05-26 NOTE — Telephone Encounter (Signed)
Called pt, left message on VM to call back.

## 2015-05-28 NOTE — Telephone Encounter (Signed)
Form completed and provided to [facility] in disabilities to process.

## 2015-05-28 NOTE — Telephone Encounter (Signed)
Form completed; provided to [facility] of disabilities for processing.

## 2015-05-31 ENCOUNTER — Emergency Department (HOSPITAL_COMMUNITY): Payer: 59

## 2015-05-31 ENCOUNTER — Encounter (HOSPITAL_COMMUNITY): Payer: Self-pay

## 2015-05-31 ENCOUNTER — Emergency Department (HOSPITAL_COMMUNITY)
Admission: EM | Admit: 2015-05-31 | Discharge: 2015-05-31 | Disposition: A | Discharge status: Home / Self Care | Payer: 59 | Attending: Emergency Medicine | Admitting: Emergency Medicine

## 2015-05-31 DIAGNOSIS — Z792 Long term (current) use of antibiotics: Secondary | ICD-10-CM | POA: Diagnosis not present

## 2015-05-31 DIAGNOSIS — S8012XA Contusion of left lower leg, initial encounter: Secondary | ICD-10-CM | POA: Insufficient documentation

## 2015-05-31 DIAGNOSIS — E1142 Type 2 diabetes mellitus with diabetic polyneuropathy: Secondary | ICD-10-CM | POA: Diagnosis not present

## 2015-05-31 DIAGNOSIS — Z7982 Long term (current) use of aspirin: Secondary | ICD-10-CM | POA: Diagnosis not present

## 2015-05-31 DIAGNOSIS — Y9389 Activity, other specified: Secondary | ICD-10-CM | POA: Insufficient documentation

## 2015-05-31 DIAGNOSIS — Z87891 Personal history of nicotine dependence: Secondary | ICD-10-CM | POA: Insufficient documentation

## 2015-05-31 DIAGNOSIS — Z794 Long term (current) use of insulin: Secondary | ICD-10-CM | POA: Diagnosis not present

## 2015-05-31 DIAGNOSIS — Z9842 Cataract extraction status, left eye: Secondary | ICD-10-CM | POA: Insufficient documentation

## 2015-05-31 DIAGNOSIS — W108XXA Fall (on) (from) other stairs and steps, initial encounter: Secondary | ICD-10-CM | POA: Diagnosis not present

## 2015-05-31 DIAGNOSIS — K219 Gastro-esophageal reflux disease without esophagitis: Secondary | ICD-10-CM | POA: Diagnosis not present

## 2015-05-31 DIAGNOSIS — I129 Hypertensive chronic kidney disease with stage 1 through stage 4 chronic kidney disease, or unspecified chronic kidney disease: Secondary | ICD-10-CM | POA: Insufficient documentation

## 2015-05-31 DIAGNOSIS — W19XXXA Unspecified fall, initial encounter: Secondary | ICD-10-CM

## 2015-05-31 DIAGNOSIS — Y92009 Unspecified place in unspecified non-institutional (private) residence as the place of occurrence of the external cause: Secondary | ICD-10-CM | POA: Insufficient documentation

## 2015-05-31 DIAGNOSIS — S01112A Laceration without foreign body of left eyelid and periocular area, initial encounter: Secondary | ICD-10-CM | POA: Diagnosis not present

## 2015-05-31 DIAGNOSIS — F419 Anxiety disorder, unspecified: Secondary | ICD-10-CM | POA: Insufficient documentation

## 2015-05-31 DIAGNOSIS — Z872 Personal history of diseases of the skin and subcutaneous tissue: Secondary | ICD-10-CM | POA: Diagnosis not present

## 2015-05-31 DIAGNOSIS — Y998 Other external cause status: Secondary | ICD-10-CM | POA: Diagnosis not present

## 2015-05-31 DIAGNOSIS — E785 Hyperlipidemia, unspecified: Secondary | ICD-10-CM | POA: Diagnosis not present

## 2015-05-31 DIAGNOSIS — Z9889 Other specified postprocedural states: Secondary | ICD-10-CM | POA: Insufficient documentation

## 2015-05-31 DIAGNOSIS — G629 Polyneuropathy, unspecified: Secondary | ICD-10-CM | POA: Insufficient documentation

## 2015-05-31 DIAGNOSIS — Z9841 Cataract extraction status, right eye: Secondary | ICD-10-CM | POA: Diagnosis not present

## 2015-05-31 DIAGNOSIS — N183 Chronic kidney disease, stage 3 (moderate): Secondary | ICD-10-CM | POA: Insufficient documentation

## 2015-05-31 DIAGNOSIS — F329 Major depressive disorder, single episode, unspecified: Secondary | ICD-10-CM | POA: Insufficient documentation

## 2015-05-31 DIAGNOSIS — E11319 Type 2 diabetes mellitus with unspecified diabetic retinopathy without macular edema: Secondary | ICD-10-CM | POA: Diagnosis not present

## 2015-05-31 DIAGNOSIS — S8992XA Unspecified injury of left lower leg, initial encounter: Secondary | ICD-10-CM | POA: Diagnosis present

## 2015-05-31 DIAGNOSIS — M1990 Unspecified osteoarthritis, unspecified site: Secondary | ICD-10-CM | POA: Insufficient documentation

## 2015-05-31 DIAGNOSIS — S0181XA Laceration without foreign body of other part of head, initial encounter: Secondary | ICD-10-CM

## 2015-05-31 DIAGNOSIS — Z79899 Other long term (current) drug therapy: Secondary | ICD-10-CM | POA: Insufficient documentation

## 2015-05-31 LAB — CBC WITH DIFFERENTIAL/PLATELET
BASOS ABS: 0 10*3/uL (ref 0.0–0.1)
Basophils Relative: 0 % (ref 0–1)
EOS ABS: 0.1 10*3/uL (ref 0.0–0.7)
Eosinophils Relative: 2 % (ref 0–5)
HEMATOCRIT: 34.8 % — AB (ref 36.0–46.0)
Hemoglobin: 11.4 g/dL — ABNORMAL LOW (ref 12.0–15.0)
Lymphocytes Relative: 19 % (ref 12–46)
Lymphs Abs: 1.4 10*3/uL (ref 0.7–4.0)
MCH: 29.5 pg (ref 26.0–34.0)
MCHC: 32.8 g/dL (ref 30.0–36.0)
MCV: 90.2 fL (ref 78.0–100.0)
Monocytes Absolute: 0.5 10*3/uL (ref 0.1–1.0)
Monocytes Relative: 6 % (ref 3–12)
NEUTROS PCT: 73 % (ref 43–77)
Neutro Abs: 5.5 10*3/uL (ref 1.7–7.7)
Platelets: 227 10*3/uL (ref 150–400)
RBC: 3.86 MIL/uL — AB (ref 3.87–5.11)
RDW: 14.4 % (ref 11.5–15.5)
WBC: 7.6 10*3/uL (ref 4.0–10.5)

## 2015-05-31 LAB — I-STAT CHEM 8, ED
BUN: 37 mg/dL — ABNORMAL HIGH (ref 6–20)
Calcium, Ion: 1.05 mmol/L — ABNORMAL LOW (ref 1.13–1.30)
Chloride: 100 mmol/L — ABNORMAL LOW (ref 101–111)
Creatinine, Ser: 1.6 mg/dL — ABNORMAL HIGH (ref 0.44–1.00)
Glucose, Bld: 176 mg/dL — ABNORMAL HIGH (ref 65–99)
HCT: 35 % — ABNORMAL LOW (ref 36.0–46.0)
Hemoglobin: 11.9 g/dL — ABNORMAL LOW (ref 12.0–15.0)
Potassium: 3.9 mmol/L (ref 3.5–5.1)
Sodium: 138 mmol/L (ref 135–145)
TCO2: 26 mmol/L (ref 0–100)

## 2015-05-31 MED ORDER — OXYCODONE-ACETAMINOPHEN 5-325 MG PO TABS: 1.0000 | ORAL_TABLET | Freq: Four times a day (QID) | ORAL | Status: DC | PRN

## 2015-05-31 MED ORDER — HYDROMORPHONE HCL 1 MG/ML IJ SOLN
1.0000 mg | Freq: Once | INTRAMUSCULAR | Status: AC
  Administered 2015-05-31: 1 mg via INTRAVENOUS
  Filled 2015-05-31: qty 1

## 2015-05-31 MED ORDER — ONDANSETRON HCL 4 MG/2ML IJ SOLN
4.0000 mg | Freq: Once | INTRAMUSCULAR | Status: AC
  Administered 2015-05-31: 4 mg via INTRAVENOUS
  Filled 2015-05-31: qty 2

## 2015-05-31 NOTE — ED Notes (Signed)
Per EMS, pt from home.  Pt fell down basement steps.  approx 5 steps.  Pt struck temple area to left side.  Small laceration noted.  Bleeding controlled.  Pt also c/o left leg pain.  No LOC.  Family at home when occurred.  No n/v.  Pt fully immobilized on spinal board. No neck pain.  Middle back pain noted.  Vitals: 124/76, hr 80, resp 18, cbg 274.

## 2015-05-31 NOTE — Discharge Instructions (Signed)
Contusion °A contusion is a deep bruise. Contusions are the result of an injury that caused bleeding under the skin. The contusion may turn blue, purple, or yellow. Minor injuries will give you a painless contusion, but more severe contusions may stay painful and swollen for a few weeks.  °CAUSES  °A contusion is usually caused by a blow, trauma, or direct force to an area of the body. °SYMPTOMS  °· Swelling and redness of the injured area. °· Bruising of the injured area. °· Tenderness and soreness of the injured area. °· Pain. °DIAGNOSIS  °The diagnosis can be made by taking a history and physical exam. An X-ray, CT scan, or MRI may be needed to determine if there were any associated injuries, such as fractures. °TREATMENT  °Specific treatment will depend on what area of the body was injured. In general, the best treatment for a contusion is resting, icing, elevating, and applying cold compresses to the injured area. Over-the-counter medicines may also be recommended for pain control. Ask your caregiver what the best treatment is for your contusion. °HOME CARE INSTRUCTIONS  °· Put ice on the injured area. °¨ Put ice in a plastic bag. °¨ Place a towel between your skin and the bag. °¨ Leave the ice on for 15-20 minutes, 3-4 times a day, or as directed by your health care provider. °· Only take over-the-counter or prescription medicines for pain, discomfort, or fever as directed by your caregiver. Your caregiver may recommend avoiding anti-inflammatory medicines (aspirin, ibuprofen, and naproxen) for 48 hours because these medicines may increase bruising. °· Rest the injured area. °· If possible, elevate the injured area to reduce swelling. °SEEK IMMEDIATE MEDICAL CARE IF:  °· You have increased bruising or swelling. °· You have pain that is getting worse. °· Your swelling or pain is not relieved with medicines. °MAKE SURE YOU:  °· Understand these instructions. °· Will watch your condition. °· Will get help right  away if you are not doing well or get worse. °Document Released: 07/28/2005 Document Revised: 10/23/2013 Document Reviewed: 08/23/2011 °[employer]® Patient Information ©2015 [employer]. This information is not intended to replace advice given to you by your health care provider. Make sure you discuss any questions you have with your health care provider. ° °

## 2015-05-31 NOTE — ED Notes (Signed)
Bed: WA02 Expected date:  Expected time:  Means of arrival:  Comments: Fall down steps

## 2015-05-31 NOTE — ED Notes (Signed)
Spinal board removed.

## 2015-05-31 NOTE — ED Provider Notes (Signed)
CSN: [id number]     Arrival date & time 05/31/15  1645 History   First MD Initiated Contact with Patient 05/31/15 1703     Chief Complaint  Patient presents with  . Facial Injury  . Leg Pain     (Consider location/radiation/quality/duration/timing/severity/associated sxs/prior Treatment) Patient is a 64 y.o. female presenting with facial injury, leg pain, and fall. The history is provided by the patient.  Facial Injury Mechanism of injury:  Fall Location:  Face Time since incident:  1 hour Pain details:    Quality:  Aching Associated symptoms: no headaches   Leg Pain Fall This is a new problem. The current episode started 1 to 2 hours ago. The problem occurs constantly. The problem has not changed since onset.Pertinent negatives include no chest pain, no abdominal pain, no headaches and no shortness of breath. Associated symptoms comments: Facial injury, left lower leg injury.  Unable to walk.  No LOC or syncope.  States was at the top of the stairs and her legs just gave out which is not unusual and she fell down a flight of stairs.. The symptoms are aggravated by walking, bending and twisting. Nothing relieves the symptoms. She has tried nothing for the symptoms. The treatment provided no relief.    Past Medical History  Diagnosis Date  . Diabetes mellitus   . Neuromuscular disorder   . Anxiety   . Depression   . Allergy     generic allergy pill; Spring and Fall only.  . Arthritis     DDD lumbar, R hip OA.  s/p ortho consult in past.  . Diabetic peripheral neuropathy associated with type 2 diabetes mellitus   . Blood transfusion without reported diagnosis     Mountain climbing accident in [place].  . Cataract     B retractions.  Marland Kitchen Ulcer     Peptic ulcer H. Pylori + s/p treatment.  Upper GI diagnosed.[name] Prilosec PRN .  Marland Kitchen Diabetic retinopathy associated with type 2 diabetes mellitus     s/p laser treatment multiple.  Unable to drive.  . Brachial plexus disorders   .  Hypertension     controlled, off meds   . GERD (gastroesophageal reflux disease)   . Hyperlipidemia   . Chronic kidney disease     stage 3 per pt.   . Diabetic retinopathy    Past Surgical History  Procedure Laterality Date  . Cholecystectomy    . Tonsillectomy    . Carpal tunnel release      Bilateral.  . Behavioral helath admission      age 71; three months in [place].  . Cardiac catheterization  11/02/2007    normal coronary arteries.  . Abdominal hysterectomy  11/02/1979    DUB; cervical dysplasia; ovaries intact.  . Eye surgery      Cataracts B. Laser surgery x 7 for Diabetic Retinopathy  . Cataract extraction, bilateral    . Abdominal surgery      staph abcess    Family History  Problem Relation Age of Onset  . Adopted: Yes  . Family history unknown: Yes   History  Substance Use Topics  . Smoking status: Former Research scientist (life sciences)  . Smokeless tobacco: Never Used     Comment: Quit 1987  . Alcohol Use: No   OB History    No data available     Review of Systems  Respiratory: Negative for shortness of breath.   Cardiovascular: Negative for chest pain.  Gastrointestinal: Negative for  abdominal pain.  Neurological: Negative for headaches.  All other systems reviewed and are negative.     Allergies  Codeine; Contrast media; Nitrofurantoin monohyd macro; Iodine; Red dye; and Ultram  Home Medications   Prior to Admission medications   Medication Sig Start Date End Date Taking? Authorizing Provider  aspirin 325 MG tablet Take 325 mg by mouth 2 (two) times daily.      Historical Provider, MD  Blood Glucose Monitoring Suppl (BLOOD GLUCOSE METER KIT AND SUPPLIES) KIT Dispense based on patient and insurance preference. Use up to four times daily as directed. (FOR ICD-9 250.00, 250.01). 07/19/14   Mancel Bale, PA-C  cephALEXin (KEFLEX) 500 MG capsule Take 1 capsule (500 mg total) by mouth 3 (three) times daily. 04/25/15   Wardell Honour, MD  diclofenac sodium (VOLTAREN) 1 %  GEL Apply 2 g topically 4 (four) times daily. 11/05/14   Wardell Honour, MD  fluconazole (DIFLUCAN) 100 MG tablet TAKE 2 TABS DAILY X 1 DAY, THEN 1 TAB DAILY X 6 DAYS 05/14/15   Jaynee Eagles, PA-C  FLUoxetine (PROZAC) 20 MG tablet Take 3 tablets (60 mg total) by mouth daily. 10/30/14   Wardell Honour, MD  furosemide (LASIX) 20 MG tablet Take 1 tablet (20 mg total) by mouth daily. 07/10/14   Wardell Honour, MD  HYDROcodone-acetaminophen (NORCO) 10-325 MG per tablet Take 1 tablet by mouth every 6 (six) hours as needed. 04/23/15   Wardell Honour, MD  insulin detemir (LEVEMIR) 100 unit/ml SOLN Inject 0.6 mLs (60 Units total) into the skin at bedtime. 02/25/15   Wardell Honour, MD  insulin lispro (HUMALOG) 100 UNIT/ML injection Inject 0.06-0.12 mLs (6-12 Units total) into the skin 2 (two) times daily with a meal. 01/22/15   Wardell Honour, MD  Insulin Syringes, Disposable, U-100 0.5 ML MISC 28 Units by Does not apply route 2 (two) times daily. 10/30/14   Wardell Honour, MD  ipratropium (ATROVENT) 0.03 % nasal spray Place 2 sprays into the nose 2 (two) times daily. 01/22/15   Wardell Honour, MD  Needles & Syringes MISC 1 Syringe by Does not apply route 2 (two) times daily. 12/29/14   Wardell Honour, MD  nystatin cream (MYCOSTATIN) Apply 1 application topically 2 (two) times daily. 08/21/14   Wardell Honour, MD  omeprazole (PRILOSEC) 40 MG capsule Take 1 capsule (40 mg total) by mouth daily. 07/10/14   Wardell Honour, MD  simvastatin (ZOCOR) 20 MG tablet Take 1 tablet (20 mg total) by mouth at bedtime. 07/10/14   Wardell Honour, MD  traZODone (DESYREL) 100 MG tablet Take 2 tablets (200 mg total) by mouth at bedtime. 07/10/14   Wardell Honour, MD   BP 145/73 mmHg  Pulse 76  Temp(Src) 98.6 F (37 C) (Oral)  Resp 18  SpO2 93% Physical Exam  Constitutional: She is oriented to person, place, and time. She appears well-developed and well-nourished. No distress.  HENT:  Head: Normocephalic. Head is with contusion.     Mouth/Throat: Oropharynx is clear and moist.  Eyes: Conjunctivae and EOM are normal. Pupils are equal, round, and reactive to light.  Neck: Normal range of motion. Neck supple. Muscular tenderness present. No spinous process tenderness present. Normal range of motion present.  Cardiovascular: Normal rate, regular rhythm and intact distal pulses.   No murmur heard. Pulmonary/Chest: Effort normal and breath sounds normal. No respiratory distress. She has no wheezes. She has no rales.  Abdominal:  Soft. She exhibits no distension. There is no tenderness. There is no rebound and no guarding.  Musculoskeletal: Normal range of motion. She exhibits tenderness. She exhibits no edema.       Left lower leg: She exhibits tenderness, bony tenderness and deformity.       Legs: Neurological: She is alert and oriented to person, place, and time.  Skin: Skin is warm and dry. No rash noted. No erythema.  Psychiatric: She has a normal mood and affect. Her behavior is normal.  Nursing note and vitals reviewed.   ED Course  Procedures (including critical care time) Labs Review Labs Reviewed  CBC WITH DIFFERENTIAL/PLATELET - Abnormal; Notable for the following:    RBC 3.86 (*)    Hemoglobin 11.4 (*)    HCT 34.8 (*)    All other components within normal limits  I-STAT CHEM 8, ED - Abnormal; Notable for the following:    Chloride 100 (*)    BUN 37 (*)    Creatinine, Ser 1.60 (*)    Glucose, Bld 176 (*)    Calcium, Ion 1.05 (*)    Hemoglobin 11.9 (*)    HCT 35.0 (*)    All other components within normal limits    Imaging Review Dg Chest 2 View  05/31/2015   CLINICAL DATA:  Fall down flight of stairs today.  EXAM: CHEST  2 VIEW  COMPARISON:  01/10/2014  FINDINGS: Lungs are somewhat hypoinflated without consolidation or effusion. Mild stable cardiomegaly. Mild degenerative change of the thoracic spine.  IMPRESSION: No active cardiopulmonary disease.   Electronically Signed   By: Marin Olp M.D.    On: 05/31/2015 17:59   Dg Tibia/fibula Left  05/31/2015   CLINICAL DATA:  Fall down flight of stairs today.  EXAM: LEFT TIBIA AND FIBULA - 2 VIEW  COMPARISON:  None.  FINDINGS: There is no evidence of fracture or other focal bone lesions. Soft tissues are unremarkable.  IMPRESSION: Negative.   Electronically Signed   By: Marin Olp M.D.   On: 05/31/2015 18:00   Ct Head Wo Contrast  05/31/2015   CLINICAL DATA:  Fall  EXAM: CT HEAD WITHOUT CONTRAST  CT CERVICAL SPINE WITHOUT CONTRAST  TECHNIQUE: Multidetector CT imaging of the head and cervical spine was performed following the standard protocol without intravenous contrast. Multiplanar CT image reconstructions of the cervical spine were also generated.  COMPARISON:  01/10/2014  FINDINGS: CT HEAD FINDINGS  There is no intracranial hemorrhage, mass or evidence of acute infarction. There is mild generalized atrophy. There is mild chronic microvascular ischemic change. There is no significant extra-axial fluid collection.  No acute intracranial findings are evident. The calvarium and skullbase are intact.  CT CERVICAL SPINE FINDINGS  The vertebral column, pedicles and facet articulations are intact. There is no evidence of acute fracture. No acute soft tissue abnormalities are evident.  There are moderately severe degenerative disc changes, greatest at C5-6.  IMPRESSION: 1. Negative for acute intracranial traumatic injury. Mild atrophy and chronic microvascular ischemic changes. 2. Negative for acute cervical spine fracture.   Electronically Signed   By: Andreas Newport M.D.   On: 05/31/2015 18:15   Ct Cervical Spine Wo Contrast  05/31/2015   CLINICAL DATA:  Fall  EXAM: CT HEAD WITHOUT CONTRAST  CT CERVICAL SPINE WITHOUT CONTRAST  TECHNIQUE: Multidetector CT imaging of the head and cervical spine was performed following the standard protocol without intravenous contrast. Multiplanar CT image reconstructions of the cervical spine were also generated.  COMPARISON:  01/10/2014  FINDINGS: CT HEAD FINDINGS  There is no intracranial hemorrhage, mass or evidence of acute infarction. There is mild generalized atrophy. There is mild chronic microvascular ischemic change. There is no significant extra-axial fluid collection.  No acute intracranial findings are evident. The calvarium and skullbase are intact.  CT CERVICAL SPINE FINDINGS  The vertebral column, pedicles and facet articulations are intact. There is no evidence of acute fracture. No acute soft tissue abnormalities are evident.  There are moderately severe degenerative disc changes, greatest at C5-6.  IMPRESSION: 1. Negative for acute intracranial traumatic injury. Mild atrophy and chronic microvascular ischemic changes. 2. Negative for acute cervical spine fracture.   Electronically Signed   By: Andreas Newport M.D.   On: 05/31/2015 18:15   Dg Femur Min 2 Views Left  05/31/2015   CLINICAL DATA:  [place] [address] 5 steps down basement stairs.  EXAM: LEFT FEMUR 2 VIEWS  COMPARISON:  None.  FINDINGS: There is no evidence of fracture or other focal bone lesions. Soft tissues are unremarkable.  IMPRESSION: Negative.   Electronically Signed   By: Andreas Newport M.D.   On: 05/31/2015 18:42     EKG Interpretation None       LACERATION REPAIR Performed by: Blanchie Dessert Authorized by: Blanchie Dessert Consent: Verbal consent obtained. Risks and benefits: risks, benefits and alternatives were discussed Consent given by: patient Patient identity confirmed: provided demographic data Prepped and Draped in normal sterile fashion Wound explored  Laceration Location: left eyebrow  Laceration Length: 1cm  No Foreign Bodies seen or palpated  Anesthesia: none Irrigation method: syringe Amount of cleaning: standard  Skin closure:dermabond Number of sutures: dermabond  Technique: dermabond  Patient tolerance: Patient tolerated the procedure well with no immediate complications.   MDM   Final  diagnoses:  Fall  Facial laceration, initial encounter  Contusion, lower leg, left, initial encounter    Patient is a 64 year old female with a history of chronic back issues requiring walking with a cane and a walker because sometimes her legs just gave out. Today when she was going down the stairs her legs gave out and she fell down a flight of stairs. She denies LOC, syncope. She has been unable to walk since this because severe pain in the left lower leg. She did hit her head but denies headache are significant neck pain. No chest or abdominal tenderness. No vision changes. Patient does not take any anticoagulation other than an aspirin.  On exam significant pain and swelling to the left proximal tib-fib. Concern for possible tibial plateau fracture. No evidence of compartment syndrome at this time and normal sensation, movement and pulse in the left foot.  CT of the head, neck pending. Chest x-ray, tib-fib and knee films pending. Patient given IV pain control  7:39 PM Imaging is all negative. Patient still having significant pain in the left calf compartments are soft. 2+ distal pulses and normal sensation. Wound on the face appeared with Dermabond. Patient discharged home.  Blanchie Dessert, MD 05/31/15 [phone]

## 2015-06-03 NOTE — Telephone Encounter (Signed)
Form completed.

## 2015-06-27 ENCOUNTER — Ambulatory Visit (INDEPENDENT_AMBULATORY_CARE_PROVIDER_SITE_OTHER): Payer: 59 | Admitting: Family Medicine

## 2015-06-27 VITALS — BP 118/70 | HR 69 | Temp 97.8°F | Resp 16 | Ht 62.0 in | Wt 228.0 lb

## 2015-06-27 DIAGNOSIS — N390 Urinary tract infection, site not specified: Secondary | ICD-10-CM | POA: Diagnosis not present

## 2015-06-27 DIAGNOSIS — R350 Frequency of micturition: Secondary | ICD-10-CM

## 2015-06-27 LAB — POCT URINALYSIS DIPSTICK
Bilirubin, UA: NEGATIVE
Blood, UA: NEGATIVE
GLUCOSE UA: NEGATIVE
KETONES UA: NEGATIVE
Nitrite, UA: NEGATIVE
Protein, UA: NEGATIVE
SPEC GRAV UA: 1.01
Urobilinogen, UA: 0.2
pH, UA: 5

## 2015-06-27 LAB — POCT UA - MICROSCOPIC ONLY
CRYSTALS, UR, HPF, POC: NEGATIVE
Casts, Ur, LPF, POC: NEGATIVE
EPITHELIAL CELLS, URINE PER MICROSCOPY: NEGATIVE
Mucus, UA: NEGATIVE
RBC, urine, microscopic: NEGATIVE
YEAST UA: NEGATIVE

## 2015-06-27 MED ORDER — CEPHALEXIN 500 MG PO CAPS: 500.0000 mg | ORAL_CAPSULE | Freq: Three times a day (TID) | ORAL | Status: DC

## 2015-06-27 NOTE — Patient Instructions (Signed)
We will treat you for a UTI and also for the abrasion on your ankle with keflext three times a day for one week Please alert the pharmacist about your red dye allergy to make sure the brand you receive is ok for you I will be in touch with your urine culture asap Let me know if you are getting worse

## 2015-06-27 NOTE — Progress Notes (Signed)
[facility] [address] [place] [address] [phone]  Date:  06/27/2015   Name:  Angel French   DOB:  Mar 12, 1951   MRN:  [id number]  PCP:  Reginia Forts, MD    Chief Complaint: Polyuria   History of Present Illness:  Angel French is a 64 y.o. very pleasant female patient who presents with the following:  Here today with concern about UTI.  She notes typical UTI symoptoms Known history of DM She has noted sx of UTI for 3-4 days; started with urgency, then urine odor, then dark urine Her urine was more clear just now as she did her urine sample No belly pain, she does have some back pain but this is typical for her at baseline No vomiting or fever No vaginal symptoms. She does use nystatin for chronic external yeast infection  Lab Results  Component Value Date   HGBA1C 8.1 04/23/2015   Her most recent glucoses readings have looked ok <120 this am  Patient Active Problem List   Diagnosis Date Noted  . Chronic pain syndrome 01/22/2015  . Chronic renal insufficiency 01/22/2015  . Right shoulder pain 02/19/2014  . Neck pain 01/17/2014  . Right arm pain 01/17/2014  . Type II or unspecified type diabetes mellitus without mention of complication, not stated as uncontrolled 01/01/2013  . Pure hypercholesterolemia 01/01/2013  . Essential hypertension, benign 01/01/2013  . Degenerative disc disease, lumbar 01/01/2013  . Depression 01/01/2013  . Osteoarthritis of right hip 01/01/2013  . Diabetic peripheral neuropathy associated with type 2 diabetes mellitus 01/01/2013  . Diabetic retinopathy 01/01/2013  . Obesity 01/01/2013    Past Medical History  Diagnosis Date  . Diabetes mellitus   . Neuromuscular disorder   . Anxiety   . Depression   . Allergy     generic allergy pill; Spring and Fall only.  . Arthritis     DDD lumbar, R hip OA.  s/p ortho consult in past.  . Diabetic peripheral neuropathy associated with type 2 diabetes mellitus    . Blood transfusion without reported diagnosis     Mountain climbing accident in [place].  . Cataract     B retractions.  Marland Kitchen Ulcer     Peptic ulcer H. Pylori + s/p treatment.  Upper GI diagnosed.[name] Prilosec PRN .  Marland Kitchen Diabetic retinopathy associated with type 2 diabetes mellitus     s/p laser treatment multiple.  Unable to drive.  . Brachial plexus disorders   . Hypertension     controlled, off meds   . GERD (gastroesophageal reflux disease)   . Hyperlipidemia   . Chronic kidney disease     stage 3 per pt.   . Diabetic retinopathy     Past Surgical History  Procedure Laterality Date  . Cholecystectomy    . Tonsillectomy    . Carpal tunnel release      Bilateral.  . Behavioral helath admission      age 49; three months in [place].  . Cardiac catheterization  11/02/2007    normal coronary arteries.  . Abdominal hysterectomy  11/02/1979    DUB; cervical dysplasia; ovaries intact.  . Eye surgery      Cataracts B. Laser surgery x 7 for Diabetic Retinopathy  . Cataract extraction, bilateral    . Abdominal surgery      staph abcess     Social History  Substance Use Topics  . Smoking status: Former Research scientist (life sciences)  . Smokeless  tobacco: Never Used     Comment: Quit 1987  . Alcohol Use: No    Family History  Problem Relation Age of Onset  . Adopted: Yes  . Family history unknown: Yes    Allergies  Allergen Reactions  . Codeine Anaphylaxis  . Contrast Media [Iodinated Diagnostic Agents] Anaphylaxis  . Nitrofurantoin Monohyd Macro Anaphylaxis  . Folic Acid Itching  . Iodine Hives  . Red Dye Itching  . Ultram [Tramadol Hcl] Nausea And Vomiting    Medication list has been reviewed and updated.  Current Outpatient Prescriptions on File Prior to Visit  Medication Sig Dispense Refill  . aspirin 325 MG tablet Take 325 mg by mouth daily.     . Blood Glucose Monitoring Suppl (BLOOD GLUCOSE METER KIT AND SUPPLIES) KIT Dispense based on patient and insurance preference. Use up  to four times daily as directed. (FOR ICD-9 250.00, 250.01). 1 each 11  . diclofenac sodium (VOLTAREN) 1 % GEL Apply 2 g topically 4 (four) times daily. (Patient taking differently: Apply 2 g topically 2 (two) times daily as needed (pain). ) 100 g 3  . fluconazole (DIFLUCAN) 100 MG tablet TAKE 2 TABS DAILY X 1 DAY, THEN 1 TAB DAILY X 6 DAYS 8 tablet 0  . FLUoxetine (PROZAC) 20 MG tablet Take 3 tablets (60 mg total) by mouth daily. (Patient taking differently: Take 40 mg by mouth daily. ) 90 tablet 5  . furosemide (LASIX) 20 MG tablet Take 1 tablet (20 mg total) by mouth daily. 30 tablet 11  . HYDROcodone-acetaminophen (NORCO) 10-325 MG per tablet Take 1 tablet by mouth every 6 (six) hours as needed. (Patient taking differently: Take 1 tablet by mouth every 8 (eight) hours. ) 100 tablet 0  . insulin detemir (LEVEMIR) 100 unit/ml SOLN Inject 0.6 mLs (60 Units total) into the skin at bedtime. (Patient taking differently: Inject 30 Units into the skin 2 (two) times daily. ) 20 mL 11  . insulin lispro (HUMALOG) 100 UNIT/ML injection Inject 0.06-0.12 mLs (6-12 Units total) into the skin 2 (two) times daily with a meal. (Patient taking differently: Inject 15-20 Units into the skin 3 (three) times daily. Per sliding scale) 10 mL 11  . Insulin Syringes, Disposable, U-100 0.5 ML MISC 28 Units by Does not apply route 2 (two) times daily. 100 each 11  . ipratropium (ATROVENT) 0.03 % nasal spray Place 2 sprays into the nose 2 (two) times daily. (Patient taking differently: Place 2 sprays into the nose daily as needed for rhinitis. ) 30 mL 4  . leflunomide (ARAVA) 10 MG tablet Take 10 mg by mouth daily.  3  . Needles & Syringes MISC 1 Syringe by Does not apply route 2 (two) times daily. 100 each 11  . nystatin cream (MYCOSTATIN) Apply 1 application topically 2 (two) times daily. (Patient taking differently: Apply 1 application topically daily as needed for dry skin. ) 90 g 5  . omeprazole (PRILOSEC) 40 MG capsule  Take 1 capsule (40 mg total) by mouth daily. 30 capsule 11  . oxyCODONE-acetaminophen (PERCOCET/ROXICET) 5-325 MG per tablet Take 1-2 tablets by mouth every 6 (six) hours as needed for severe pain. 15 tablet 0  . predniSONE (DELTASONE) 5 MG tablet Take 5 mg by mouth daily.  2  . simvastatin (ZOCOR) 20 MG tablet Take 1 tablet (20 mg total) by mouth at bedtime. 30 tablet 11  . traZODone (DESYREL) 100 MG tablet Take 2 tablets (200 mg total) by mouth at bedtime. [place]  tablet 5   No current facility-administered medications on file prior to visit.    Review of Systems:  As per HPI- otherwise negative.   Physical Examination: Filed Vitals:   06/27/15 1326  BP: 118/70  Pulse: 69  Temp: 97.8 F (36.6 C)  Resp: 16   Filed Vitals:   06/27/15 1326  Height: _0  (1.575 m)  Weight: 228 lb (103.42 kg)   Body mass index is 41.69 kg/(m^2). Ideal Body Weight: Weight in (lb) to have BMI = 25: 136.4  GEN: WDWN, NAD, Non-toxic, A & O x 3, morbid obesity, looks wel HEENT: Atraumatic, Normocephalic. Neck supple. No masses, No LAD. Ears and Nose: No external deformity. CV: RRR, No M/G/R. No JVD. No thrill. No extra heart sounds. PULM: CTA B, no wheezes, crackles, rhonchi. No retractions. No resp. distress. No accessory muscle use. ABD: S, NT, ND EXTR: No c/c/e NEURO Normal gait for pt, uses cane PSYCH: Normally interactive. Conversant. Not depressed or anxious appearing.  Calm demeanor.  She also notes a wound on her anterior left ankle from a fall a few weeks ago. It is getting better but is a bit red and still tender.  No fluctuance  Results for orders placed or performed in visit on 06/27/15  POCT urinalysis dipstick  Result Value Ref Range   Color, UA yellow    Clarity, UA clear    Glucose, UA neg    Bilirubin, UA neg    Ketones, UA neg    Spec Grav, UA 1.010    Blood, UA neg    pH, UA 5.0    Protein, UA neg    Urobilinogen, UA 0.2    Nitrite, UA neg    Leukocytes, UA small (1+)  (A) Negative  POCT UA - Microscopic Only  Result Value Ref Range   WBC, Ur, HPF, POC 4-17    RBC, urine, microscopic neg    Bacteria, U Microscopic trace    Mucus, UA neg    Epithelial cells, urine per micros neg    Crystals, Ur, HPF, POC neg    Casts, Ur, LPF, POC neg    Yeast, UA neg     Assessment and Plan: Urinary tract infection without hematuria, site unspecified - Plan: cephALEXin (KEFLEX) 500 MG capsule, Urine culture  Frequency of urination - Plan: POCT urinalysis dipstick, POCT UA - Microscopic Only, Urine culture  Here today with a likely UTI.  Treat with keflex as this will also cover any infection in her ankle.   Cross reactivity warning about red dye with keflex- pt reports she has taken keflex several times without ill effect but she will check with her pharmacist as well Await culture and will be in touch with her  Signed Lamar Blinks, MD

## 2015-06-30 ENCOUNTER — Other Ambulatory Visit: Payer: Self-pay

## 2015-06-30 DIAGNOSIS — F329 Major depressive disorder, single episode, unspecified: Secondary | ICD-10-CM

## 2015-06-30 DIAGNOSIS — F32A Depression, unspecified: Secondary | ICD-10-CM

## 2015-06-30 MED ORDER — TRAZODONE HCL 100 MG PO TABS: 200.0000 mg | ORAL_TABLET | Freq: Every day | ORAL | Status: DC

## 2015-07-02 LAB — URINE CULTURE: Colony Count: >=100000

## 2015-07-28 ENCOUNTER — Other Ambulatory Visit: Payer: Self-pay

## 2015-07-28 ENCOUNTER — Other Ambulatory Visit: Payer: Self-pay | Admitting: Family Medicine

## 2015-07-28 DIAGNOSIS — Z1231 Encounter for screening mammogram for malignant neoplasm of breast: Secondary | ICD-10-CM

## 2015-07-30 ENCOUNTER — Encounter: Payer: 59 | Admitting: Family Medicine

## 2015-08-26 ENCOUNTER — Ambulatory Visit
Admission: RE | Admit: 2015-08-26 | Discharge: 2015-08-26 | Disposition: A | Discharge status: Home / Self Care | Payer: 59 | Source: Ambulatory Visit

## 2015-08-26 DIAGNOSIS — Z1231 Encounter for screening mammogram for malignant neoplasm of breast: Secondary | ICD-10-CM

## 2015-08-27 ENCOUNTER — Other Ambulatory Visit: Payer: Self-pay | Admitting: Family Medicine

## 2015-08-27 DIAGNOSIS — R928 Other abnormal and inconclusive findings on diagnostic imaging of breast: Secondary | ICD-10-CM

## 2015-08-29 ENCOUNTER — Ambulatory Visit
Admission: RE | Admit: 2015-08-29 | Discharge: 2015-08-29 | Disposition: A | Discharge status: Home / Self Care | Payer: 59 | Source: Ambulatory Visit | Attending: Family Medicine | Admitting: Family Medicine

## 2015-08-29 DIAGNOSIS — R928 Other abnormal and inconclusive findings on diagnostic imaging of breast: Secondary | ICD-10-CM

## 2015-09-08 ENCOUNTER — Encounter: Payer: Self-pay | Admitting: Family Medicine

## 2015-09-15 ENCOUNTER — Telehealth: Payer: Self-pay

## 2015-09-15 DIAGNOSIS — M5136 Other intervertebral disc degeneration, lumbar region: Secondary | ICD-10-CM

## 2015-09-15 NOTE — Telephone Encounter (Signed)
Pt states she is completely out of her VICODIN and have made an appt for December. Please call [phone] when ready for pick up

## 2015-09-16 MED ORDER — HYDROCODONE-ACETAMINOPHEN 10-325 MG PO TABS: 1.0000 | ORAL_TABLET | Freq: Four times a day (QID) | ORAL | Status: DC | PRN

## 2015-09-16 NOTE — Telephone Encounter (Signed)
Please call --- rx ready for pick up.

## 2015-09-17 NOTE — Telephone Encounter (Signed)
Pt.notified

## 2015-10-06 ENCOUNTER — Other Ambulatory Visit: Payer: Self-pay | Admitting: Family Medicine

## 2015-10-06 ENCOUNTER — Encounter: Payer: Self-pay | Admitting: Family Medicine

## 2015-10-06 ENCOUNTER — Ambulatory Visit (HOSPITAL_COMMUNITY)
Admission: RE | Admit: 2015-10-06 | Discharge: 2015-10-06 | Disposition: A | Discharge status: Home / Self Care | Payer: 59 | Source: Ambulatory Visit | Attending: Cardiology | Admitting: Cardiology

## 2015-10-06 ENCOUNTER — Ambulatory Visit (INDEPENDENT_AMBULATORY_CARE_PROVIDER_SITE_OTHER): Payer: 59 | Admitting: Family Medicine

## 2015-10-06 VITALS — BP 126/79 | HR 93 | Temp 98.1°F | Resp 16 | Ht 62.5 in | Wt 239.0 lb

## 2015-10-06 DIAGNOSIS — F329 Major depressive disorder, single episode, unspecified: Secondary | ICD-10-CM

## 2015-10-06 DIAGNOSIS — E7800 Pure hypercholesterolemia, unspecified: Secondary | ICD-10-CM

## 2015-10-06 DIAGNOSIS — M5136 Other intervertebral disc degeneration, lumbar region: Secondary | ICD-10-CM | POA: Diagnosis not present

## 2015-10-06 DIAGNOSIS — E113599 Type 2 diabetes mellitus with proliferative diabetic retinopathy without macular edema, unspecified eye: Secondary | ICD-10-CM

## 2015-10-06 DIAGNOSIS — I10 Essential (primary) hypertension: Secondary | ICD-10-CM

## 2015-10-06 DIAGNOSIS — N3946 Mixed incontinence: Secondary | ICD-10-CM | POA: Diagnosis not present

## 2015-10-06 DIAGNOSIS — L539 Erythematous condition, unspecified: Secondary | ICD-10-CM | POA: Diagnosis not present

## 2015-10-06 DIAGNOSIS — M05741 Rheumatoid arthritis with rheumatoid factor of right hand without organ or systems involvement: Secondary | ICD-10-CM | POA: Diagnosis not present

## 2015-10-06 DIAGNOSIS — E1142 Type 2 diabetes mellitus with diabetic polyneuropathy: Secondary | ICD-10-CM | POA: Diagnosis not present

## 2015-10-06 DIAGNOSIS — E669 Obesity, unspecified: Secondary | ICD-10-CM

## 2015-10-06 DIAGNOSIS — I83209 Varicose veins of unspecified lower extremity with both ulcer of unspecified site and inflammation: Secondary | ICD-10-CM | POA: Diagnosis not present

## 2015-10-06 DIAGNOSIS — N183 Chronic kidney disease, stage 3 unspecified: Secondary | ICD-10-CM

## 2015-10-06 DIAGNOSIS — Z0000 Encounter for general adult medical examination without abnormal findings: Secondary | ICD-10-CM | POA: Diagnosis not present

## 2015-10-06 DIAGNOSIS — F32A Depression, unspecified: Secondary | ICD-10-CM

## 2015-10-06 DIAGNOSIS — Z23 Encounter for immunization: Secondary | ICD-10-CM | POA: Diagnosis not present

## 2015-10-06 DIAGNOSIS — Z1159 Encounter for screening for other viral diseases: Secondary | ICD-10-CM | POA: Diagnosis not present

## 2015-10-06 DIAGNOSIS — M79604 Pain in right leg: Secondary | ICD-10-CM | POA: Diagnosis not present

## 2015-10-06 DIAGNOSIS — M05742 Rheumatoid arthritis with rheumatoid factor of left hand without organ or systems involvement: Secondary | ICD-10-CM

## 2015-10-06 DIAGNOSIS — G894 Chronic pain syndrome: Secondary | ICD-10-CM | POA: Diagnosis not present

## 2015-10-06 DIAGNOSIS — I8000 Phlebitis and thrombophlebitis of superficial vessels of unspecified lower extremity: Secondary | ICD-10-CM | POA: Diagnosis not present

## 2015-10-06 DIAGNOSIS — Z114 Encounter for screening for human immunodeficiency virus [HIV]: Secondary | ICD-10-CM

## 2015-10-06 LAB — COMPREHENSIVE METABOLIC PANEL
ALT: 20 U/L (ref 6–29)
AST: 19 U/L (ref 10–35)
Albumin: 4.2 g/dL (ref 3.6–5.1)
Alkaline Phosphatase: 79 U/L (ref 33–130)
BUN: 32 mg/dL — ABNORMAL HIGH (ref 7–25)
CO2: 28 mmol/L (ref 20–31)
Calcium: 9.4 mg/dL (ref 8.6–10.4)
Chloride: 104 mmol/L (ref 98–110)
Creat: 1.44 mg/dL — ABNORMAL HIGH (ref 0.50–0.99)
GLUCOSE: 60 mg/dL — AB (ref 65–99)
POTASSIUM: 3.9 mmol/L (ref 3.5–5.3)
Sodium: 140 mmol/L (ref 135–146)
Total Bilirubin: 0.4 mg/dL (ref 0.2–1.2)
Total Protein: 7.1 g/dL (ref 6.1–8.1)

## 2015-10-06 LAB — CBC WITH DIFFERENTIAL/PLATELET
Basophils Absolute: 0 10*3/uL (ref 0.0–0.1)
Basophils Relative: 0 % (ref 0–1)
Eosinophils Absolute: 0.1 10*3/uL (ref 0.0–0.7)
Eosinophils Relative: 1 % (ref 0–5)
HCT: 32.9 % — ABNORMAL LOW (ref 36.0–46.0)
Hemoglobin: 11.1 g/dL — ABNORMAL LOW (ref 12.0–15.0)
Lymphocytes Relative: 9 % — ABNORMAL LOW (ref 12–46)
Lymphs Abs: 1 10*3/uL (ref 0.7–4.0)
MCH: 30.3 pg (ref 26.0–34.0)
MCHC: 33.7 g/dL (ref 30.0–36.0)
MCV: 89.9 fL (ref 78.0–100.0)
MPV: 9.2 fL (ref 8.6–12.4)
Monocytes Absolute: 0.8 10*3/uL (ref 0.1–1.0)
Monocytes Relative: 7 % (ref 3–12)
NEUTROS PCT: 83 % — AB (ref 43–77)
Neutro Abs: 9 10*3/uL — ABNORMAL HIGH (ref 1.7–7.7)
Platelets: 266 10*3/uL (ref 150–400)
RBC: 3.66 MIL/uL — ABNORMAL LOW (ref 3.87–5.11)
RDW: 14.6 % (ref 11.5–15.5)
WBC: 10.8 10*3/uL — AB (ref 4.0–10.5)

## 2015-10-06 LAB — HEMOGLOBIN A1C
Hgb A1c MFr Bld: 8.7 % — ABNORMAL HIGH (ref <5.7)
Mean Plasma Glucose: 203 mg/dL — ABNORMAL HIGH (ref <117)

## 2015-10-06 LAB — LIPID PANEL
CHOLESTEROL: 230 mg/dL — AB (ref 125–200)
HDL: 57 mg/dL (ref >=46)
LDL Cholesterol: 149 mg/dL — ABNORMAL HIGH (ref <130)
TRIGLYCERIDES: 118 mg/dL (ref <150)
Total CHOL/HDL Ratio: 4 Ratio (ref <=5.0)
VLDL: 24 mg/dL (ref <30)

## 2015-10-06 LAB — POCT URINALYSIS DIP (MANUAL ENTRY)
BILIRUBIN UA: NEGATIVE
Blood, UA: NEGATIVE
GLUCOSE UA: NEGATIVE
Ketones, POC UA: NEGATIVE
LEUKOCYTES UA: NEGATIVE
Nitrite, UA: NEGATIVE
PH UA: 5
Protein Ur, POC: NEGATIVE
Spec Grav, UA: 1.01
UROBILINOGEN UA: 0.2

## 2015-10-06 LAB — TSH: TSH: 0.804 u[IU]/mL (ref 0.350–4.500)

## 2015-10-06 MED ORDER — HYDROCODONE-ACETAMINOPHEN 10-325 MG PO TABS: 1.0000 | ORAL_TABLET | Freq: Four times a day (QID) | ORAL | Status: DC | PRN

## 2015-10-06 MED ORDER — ESCITALOPRAM OXALATE 10 MG PO TABS: 10.0000 mg | ORAL_TABLET | Freq: Every day | ORAL | Status: DC

## 2015-10-06 MED ORDER — SOLIFENACIN SUCCINATE 5 MG PO TABS: 5.0000 mg | ORAL_TABLET | Freq: Every day | ORAL | Status: DC

## 2015-10-06 MED ORDER — ENOXAPARIN SODIUM 100 MG/ML ~~LOC~~ SOLN: 100.0000 mg | Freq: Two times a day (BID) | SUBCUTANEOUS | Status: DC

## 2015-10-06 NOTE — Progress Notes (Signed)
Subjective:    Patient ID: Angel French, female    DOB: 1951-02-24, 65 y.o.   MRN: [id number]  10/06/2015  Annual Exam and Medication Refill   HPI This 64 y.o. female presents for Routine Physical Examination.  Last physical: 07-10-2014 Pap smear: hysterectomy; DUB; cervical dysplasia; ovaries intact. Mammogram: 08-29-15 abnormal ;follow up in 3 months. Colonoscopy: 2015; repeat in 10 years Bone density: never TDAP:  unsure Pneumovax:  2015 Zostavax: never Influenza: 2015 Eye exam:  Every year or 6 months.  Retinopathy.  [facility] at [facility]. Dental exam:  Dentures top.  R thigh redness, pain:  Onset two weeks ago; worsening.  Redder than it was with onset.  Painful but not unbearable.  The skin is sensitive to touch.  Rope feeling.    Rheumatoid arthritis:  Leflunomide 2mg  daily; to start additional medication.  Prednisone 5mg  daily.    Anxiety and depression: fluoxetine 20mg  three daily; ran out of Prozac and was not aware; has not been taking it for six weeks.  Starting to cry at commercials.  Tearing up.  Having a lot of nightmares with it.  Nightmares have resolved off of medication.  Previous Wellbutrin, Zoloft (stomach upset).    DMII: terrible.  Running in 200s.  Levimir and Humolog.  Too much stress.    Chronic pain syndrome/DDD lumbar spine:  Patient reports good compliance with medication, good tolerance to medication, and good symptom control.    Urinary incontinence: chronic; has always leaked urine since birth of daughter.    Review of Systems  Constitutional: Negative for fever, chills, diaphoresis, activity change, appetite change, fatigue and unexpected weight change.  HENT: Negative for congestion, dental problem, drooling, ear discharge, ear pain, facial swelling, hearing loss, mouth sores, nosebleeds, postnasal drip, rhinorrhea, sinus pressure, sneezing, sore throat, tinnitus, trouble swallowing and voice change.   Eyes: Negative for photophobia, pain, discharge,  redness, itching and visual disturbance.  Respiratory: Negative for apnea, cough, choking, chest tightness, shortness of breath, wheezing and stridor.   Cardiovascular: Negative for chest pain, palpitations and leg swelling.  Gastrointestinal: Negative for nausea, vomiting, abdominal pain, diarrhea, constipation, blood in stool, abdominal distention, anal bleeding and rectal pain.  Endocrine: Negative for cold intolerance, heat intolerance, polydipsia, polyphagia and polyuria.  Genitourinary: Negative for dysuria, urgency, frequency, hematuria, flank pain, decreased urine volume, vaginal bleeding, vaginal discharge, enuresis, difficulty urinating, genital sores, vaginal pain, menstrual problem, pelvic pain and dyspareunia.  Musculoskeletal: Negative for myalgias, back pain, joint swelling, arthralgias, gait problem, neck pain and neck stiffness.  Skin: Negative for color change, pallor, rash and wound.  Allergic/Immunologic: Negative for environmental allergies, food allergies and immunocompromised state.  Neurological: Negative for dizziness, tremors, seizures, syncope, facial asymmetry, speech difficulty, weakness, light-headedness, numbness and headaches.  Hematological: Negative for adenopathy. Does not bruise/bleed easily.  Psychiatric/Behavioral: Negative for suicidal ideas, hallucinations, behavioral problems, confusion, sleep disturbance, self-injury, dysphoric mood, decreased concentration and agitation. The patient is not nervous/anxious and is not hyperactive.     Past Medical History  Diagnosis Date  . Diabetes mellitus   . Neuromuscular disorder ([place])   . Anxiety   . Depression   . Allergy     generic allergy pill; Spring and Fall only.  . Arthritis     DDD lumbar, R hip OA.  s/p ortho consult in past.  . Diabetic peripheral neuropathy associated with type 2 diabetes mellitus ([place])   . Blood transfusion without reported diagnosis     Mountain climbing accident in [place].  Marland Kitchen  Cataract     B retractions.  Marland Kitchen Ulcer     Peptic ulcer H. Pylori + s/p treatment.  Upper GI diagnosed.[name] Prilosec PRN .  Marland Kitchen Diabetic retinopathy associated with type 2 diabetes mellitus ([place])     s/p laser treatment multiple.  Unable to drive.  . Brachial plexus disorders   . Hypertension     controlled, off meds   . GERD (gastroesophageal reflux disease)   . Hyperlipidemia   . Chronic kidney disease     stage 3 per pt.   . Diabetic retinopathy ([place])   . Rheumatoid arthritis [facility])    Past Surgical History  Procedure Laterality Date  . Cholecystectomy    . Tonsillectomy    . Carpal tunnel release      Bilateral.  . Behavioral helath admission      age 59; three months in [place].  . Cardiac catheterization  11/02/2007    normal coronary arteries.  . Eye surgery      Cataracts B. Laser surgery x 7 for Diabetic Retinopathy  . Cataract extraction, bilateral    . Abdominal surgery      staph abcess   . Abdominal hysterectomy  11/02/1979    DUB; cervical dysplasia; ovaries intact.   Allergies  Allergen Reactions  . Codeine Anaphylaxis  . Contrast Media [Iodinated Diagnostic Agents] Anaphylaxis  . Nitrofurantoin Monohyd Macro Anaphylaxis  . Folic Acid Itching  . Iodine Hives  . Red Dye Itching  . Ultram [Tramadol Hcl] Nausea And Vomiting    Social History   Social History  . Marital Status: Married    Spouse Name: Advice worker  . Number of Children: 2  . Years of Education: college   Occupational History  . retired     retretied   Social History Main Topics  . Smoking status: Former Research scientist (life sciences)  . Smokeless tobacco: Never Used     Comment: Quit 1987  . Alcohol Use: No  . Drug Use: No  . Sexual Activity: Yes    Birth Control/ Protection: Surgical, Post-menopausal     Comment: widow   Other Topics Concern  . Not on file   Social History Narrative   Marital status: widowed since 2009; dating x 6 years.  Happy; no abuse.      Children: 2 children (75  daughter, 100 son estranged); 2 grandchildren.      Lives: with boyfriend, daughter, granddaughter, friend of daughter.  Lives in pt house.      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  Diabetic retinopathy; unable to drive.      Tobacco:  Smoked x 20 years; quit 20 years.      Alcohol:  Never.      Drugs:  None since college.      Exercise:  Walking several times per week; walks the dog.   Education college   Caffeine one cup daily.   Right handed            Family History  Problem Relation Age of Onset  . Adopted: Yes  . Family history unknown: Yes       Objective:    BP 126/79 mmHg  Pulse 93  Temp(Src) 98.1 F (36.7 C) (Oral)  Resp 16  Ht 5' 2.5" (1.588 m)  Wt 239 lb (108.41 kg)  BMI 42.99 kg/m2 Physical Exam  Constitutional: She is oriented to person, place, and time. She appears well-developed and well-nourished. No distress.  HENT:  Head: Normocephalic and atraumatic.  Right Ear: External ear normal.  Left Ear: External ear normal.  Nose: Nose normal.  Mouth/Throat: Oropharynx is clear and moist.  Eyes: Conjunctivae and EOM are normal. Pupils are equal, round, and reactive to light.  Neck: Normal range of motion and full passive range of motion without pain. Neck supple. No JVD present. Carotid bruit is not present. No thyromegaly present.  Cardiovascular: Normal rate, regular rhythm and normal heart sounds.  Exam reveals no gallop and no friction rub.   No murmur heard. R medial distal thigh with erythematous palpable cord.  Pulmonary/Chest: Effort normal and breath sounds normal. She has no wheezes. She has no rales.  Abdominal: Soft. Bowel sounds are normal. She exhibits no distension and no mass. There is no tenderness. There is no rebound and no guarding.  Musculoskeletal:       Right shoulder: Normal.       Left shoulder: Normal.       Cervical back: Normal.  Lymphadenopathy:    She has no cervical adenopathy.  Neurological: She is  alert and oriented to person, place, and time. She has normal reflexes. No cranial nerve deficit. She exhibits normal muscle tone. Coordination normal.  Skin: Skin is warm and dry. No rash noted. She is not diaphoretic. No erythema. No pallor.  Psychiatric: She has a normal mood and affect. Her behavior is normal. Judgment and thought content normal.  Nursing note and vitals reviewed.       Assessment & Plan:   1. Routine physical examination   2. Pure hypercholesterolemia   3. Essential hypertension, benign   4. Diabetic peripheral neuropathy associated with type 2 diabetes mellitus ([place])   5. 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 Depression   7. Degenerative disc disease, lumbar   8. Chronic renal insufficiency, stage 3 (moderate)   9. Chronic pain syndrome   10. Obesity   11. Screening for HIV (human immunodeficiency virus)   12. Need for hepatitis C screening test   13. Need for Tdap vaccination   14. Need for prophylactic vaccination and inoculation against viral hepatitis   15. Mixed incontinence   16. Right leg pain     Orders Placed This Encounter  Procedures  . Tdap vaccine greater than or equal to 7yo IM  . Hepatitis A vaccine adult IM  . CBC with Differential/Platelet  . Comprehensive metabolic panel    Order Specific Question:  Has the patient fasted?    Answer:  Yes  . Hemoglobin A1c  . Lipid panel    Order Specific Question:  Has the patient fasted?    Answer:  Yes  . TSH  . Microalbumin, urine  . HIV antibody  . Hepatitis C antibody  . POCT urinalysis dipstick   Meds ordered this encounter  Medications  . escitalopram (LEXAPRO) 10 MG tablet    Sig: Take 1 tablet (10 mg total) by mouth daily.    Dispense:  30 tablet    Refill:  5  . DISCONTD: HYDROcodone-acetaminophen (NORCO) 10-325 MG tablet    Sig: Take 1 tablet by mouth every 6 (six) hours as needed.    Dispense:  100 tablet      Refill:  0  . DISCONTD: HYDROcodone-acetaminophen (NORCO) 10-325 MG tablet    Sig: Take 1 tablet by mouth every 6 (six) hours as needed.    Dispense:  100 tablet    Refill:  0  DO NOT FILL UNTIL 11-06-2015  . HYDROcodone-acetaminophen (NORCO) 10-325 MG tablet    Sig: Take 1 tablet by mouth every 6 (six) hours as needed.    Dispense:  100 tablet    Refill:  0    DO NOT FILL UNTIL 12-07-2015  . solifenacin (VESICARE) 5 MG tablet    Sig: Take 1 tablet (5 mg total) by mouth daily.    Dispense:  30 tablet    Refill:  5  . enoxaparin (LOVENOX) 100 MG/ML injection    Sig: Inject 1 mL (100 mg total) into the skin every 12 (twelve) hours.    Dispense:  20 mL    Refill:  0    Return in about 3 months (around 01/04/2016) for recheck diabetes, high cholesterol, anxiety/depression.    Meridith Romick Elayne Guerin, M.D. [facility] [address] [place], [place]  [address] [phone] phone [phone] fax

## 2015-10-06 NOTE — Patient Instructions (Signed)

## 2015-10-07 LAB — MICROALBUMIN, URINE: Microalb, Ur: 0.4 mg/dL

## 2015-10-07 LAB — HIV ANTIBODY (ROUTINE TESTING W REFLEX): HIV: NONREACTIVE

## 2015-10-07 LAB — HEPATITIS C ANTIBODY: HCV Ab: NEGATIVE

## 2015-10-09 ENCOUNTER — Telehealth: Payer: Self-pay

## 2015-10-09 NOTE — Telephone Encounter (Signed)
PA needed for Vesicare. The PA form for ins states : pt must have history of trial and failure, inadequate response, or intolerance to Toviaz AND one of the following: Oxytrol OTC, oxybutynin (generic Ditropan), oxybutynin extended-release (generic Ditropan XL). I didn't see any h/o other meds. Called pt who stated YEARS ago she tried one other med and she has no memory of the name even when I named some alternatives. Pt is willing to try whatever is covered that Dr Tamala Julian feels is best. Dr Tamala Julian, do you want to Rx one of the above?

## 2015-10-10 MED ORDER — FESOTERODINE FUMARATE ER 4 MG PO TB24: 4.0000 mg | ORAL_TABLET | Freq: Every day | ORAL | Status: DC

## 2015-10-10 NOTE — Telephone Encounter (Signed)
Advised pt about the new Rx and she agreed to try it. She will discuss it further w/Dr Tamala Julian when she comes in next week. Pt plans to come see you, Dr Tamala Julian, on Wed at 102. Pt does want to ask you if the sites where she is injecting Lovenox are supposed to "blow up into big hematomas and look real ugly". I asked if they were just bruises under the skin or if they swell also and she replied, some of both. Dr Tamala Julian, I don't have a lot of experience with Lovenox so did not want to advise w/out asking you. Please advise.

## 2015-10-10 NOTE — Telephone Encounter (Signed)
Sent in rx for Toviaz 4mg  daily; please advise patient.  Also confirm that patient started Lovenox.  She will need OV with me in upcoming week.  Please schedule her with me.

## 2015-10-10 NOTE — Telephone Encounter (Signed)
Yes; unfortunately, Lovenox can cause really larges bruises at injection sites.

## 2015-10-14 NOTE — Telephone Encounter (Signed)
Spoke to pt and advised Dr [place] Caul answer below. She will still plan to RTC tomorrow night to see Dr Tamala Julian as planned.

## 2015-10-15 ENCOUNTER — Ambulatory Visit (INDEPENDENT_AMBULATORY_CARE_PROVIDER_SITE_OTHER): Payer: 59 | Admitting: Family Medicine

## 2015-10-15 VITALS — BP 122/80 | HR 106 | Temp 97.7°F | Resp 16 | Ht 62.0 in | Wt 239.0 lb

## 2015-10-15 DIAGNOSIS — Z5181 Encounter for therapeutic drug level monitoring: Secondary | ICD-10-CM | POA: Diagnosis not present

## 2015-10-15 DIAGNOSIS — Z23 Encounter for immunization: Secondary | ICD-10-CM | POA: Diagnosis not present

## 2015-10-15 DIAGNOSIS — N183 Chronic kidney disease, stage 3 unspecified: Secondary | ICD-10-CM

## 2015-10-15 DIAGNOSIS — I82811 Embolism and thrombosis of superficial veins of right lower extremities: Secondary | ICD-10-CM

## 2015-10-15 DIAGNOSIS — E1142 Type 2 diabetes mellitus with diabetic polyneuropathy: Secondary | ICD-10-CM

## 2015-10-15 LAB — APTT: aPTT: 35 seconds (ref 24–37)

## 2015-10-15 LAB — POCT CBC
GRANULOCYTE PERCENT: 72.9 % (ref 37–80)
HEMATOCRIT: 31.4 % — AB (ref 37.7–47.9)
Hemoglobin: 10.6 g/dL — AB (ref 12.2–16.2)
LYMPH, POC: 1.4 (ref 0.6–3.4)
MCH, POC: 30.1 pg (ref 27–31.2)
MCHC: 33.9 g/dL (ref 31.8–35.4)
MCV: 88.7 fL (ref 80–97)
MID (CBC): 0.4 (ref 0–0.9)
MPV: 6.5 fL (ref 0–99.8)
POC GRANULOCYTE: 4.8 (ref 2–6.9)
POC LYMPH %: 21.3 % (ref 10–50)
POC MID %: 5.8 % (ref 0–12)
Platelet Count, POC: 259 10*3/uL (ref 142–424)
RBC: 3.54 M/uL — AB (ref 4.04–5.48)
RDW, POC: 15.5 %
WBC: 6.6 10*3/uL (ref 4.6–10.2)

## 2015-10-15 MED ORDER — RIVAROXABAN (XARELTO) VTE STARTER PACK (15 & 20 MG): ORAL_TABLET | ORAL | Status: DC

## 2015-10-16 LAB — COMPREHENSIVE METABOLIC PANEL
ALT: 41 U/L — AB (ref 6–29)
AST: 27 U/L (ref 10–35)
Albumin: 3.8 g/dL (ref 3.6–5.1)
Alkaline Phosphatase: 77 U/L (ref 33–130)
BUN: 22 mg/dL (ref 7–25)
CALCIUM: 8.6 mg/dL (ref 8.6–10.4)
CO2: 29 mmol/L (ref 20–31)
Chloride: 105 mmol/L (ref 98–110)
Creat: 1.21 mg/dL — ABNORMAL HIGH (ref 0.50–0.99)
GLUCOSE: 168 mg/dL — AB (ref 65–99)
POTASSIUM: 5.2 mmol/L (ref 3.5–5.3)
Sodium: 140 mmol/L (ref 135–146)
Total Bilirubin: 0.4 mg/dL (ref 0.2–1.2)
Total Protein: 6.6 g/dL (ref 6.1–8.1)

## 2015-10-16 LAB — PROTIME-INR
INR: 0.98 (ref <1.50)
PROTHROMBIN TIME: 13.1 s (ref 11.6–15.2)

## 2015-10-30 DIAGNOSIS — M05741 Rheumatoid arthritis with rheumatoid factor of right hand without organ or systems involvement: Secondary | ICD-10-CM | POA: Insufficient documentation

## 2015-10-30 DIAGNOSIS — M05742 Rheumatoid arthritis with rheumatoid factor of left hand without organ or systems involvement: Secondary | ICD-10-CM

## 2015-10-31 NOTE — Progress Notes (Signed)
Subjective:    Patient ID: DOMITILA SAILER, female    DOB: 1950-11-20, 64 y.o.   MRN: [id number]  10/15/2015  Follow-up and Immunizations   HPI This 64 y.o. female presents for follow-up of RLE greater saphenous superficial thrombus; patient initiated on Lovenox injections due to traveling for holidays, obesity, and other comorbidities.  Doing well. Denies chest pain, palpitations, SOB, leg swelling. R distal thigh palpable painful cord now much improved.  Tolerating Lovenox other than diffuse bruising on abdomen.    Review of Systems  Constitutional: Negative for fever, chills, diaphoresis and fatigue.  Eyes: Negative for visual disturbance.  Respiratory: Negative for cough and shortness of breath.   Cardiovascular: Negative for chest pain, palpitations and leg swelling.  Gastrointestinal: Negative for nausea, vomiting, abdominal pain, diarrhea and constipation.  Endocrine: Negative for cold intolerance, heat intolerance, polydipsia, polyphagia and polyuria.  Neurological: Negative for dizziness, tremors, seizures, syncope, facial asymmetry, speech difficulty, weakness, light-headedness, numbness and headaches.    Past Medical History  Diagnosis Date  . Diabetes mellitus   . Neuromuscular disorder ([place])   . Anxiety   . Depression   . Allergy     generic allergy pill; Spring and Fall only.  . Arthritis     DDD lumbar, R hip OA.  s/p ortho consult in past.  . Diabetic peripheral neuropathy associated with type 2 diabetes mellitus ([place])   . Blood transfusion without reported diagnosis     Mountain climbing accident in [place].  . Cataract     B retractions.  Marland Kitchen Ulcer     Peptic ulcer H. Pylori + s/p treatment.  Upper GI diagnosed.[name] Prilosec PRN .  Marland Kitchen Diabetic retinopathy associated with type 2 diabetes mellitus ([place])     s/p laser treatment multiple.  Unable to drive.  . Brachial plexus disorders   . Hypertension     controlled, off meds   . GERD (gastroesophageal  reflux disease)   . Hyperlipidemia   . Chronic kidney disease     stage 3 per pt.   . Diabetic retinopathy ([place])   . Rheumatoid arthritis [facility])    Past Surgical History  Procedure Laterality Date  . Cholecystectomy    . Tonsillectomy    . Carpal tunnel release      Bilateral.  . Behavioral helath admission      age 32; three months in [place].  . Cardiac catheterization  11/02/2007    normal coronary arteries.  . Eye surgery      Cataracts B. Laser surgery x 7 for Diabetic Retinopathy  . Cataract extraction, bilateral    . Abdominal surgery      staph abcess   . Abdominal hysterectomy  11/02/1979    DUB; cervical dysplasia; ovaries intact.   Allergies  Allergen Reactions  . Codeine Anaphylaxis  . Contrast Media [Iodinated Diagnostic Agents] Anaphylaxis  . Nitrofurantoin Monohyd Macro Anaphylaxis  . Folic Acid Itching  . Iodine Hives  . Red Dye Itching  . Ultram [Tramadol Hcl] Nausea And Vomiting    Social History   Social History  . Marital Status: Married    Spouse Name: Advice worker  . Number of Children: 2  . Years of Education: college   Occupational History  . retired     retretied   Social History Main Topics  . Smoking status: Former Research scientist (life sciences)  . Smokeless tobacco: Never Used     Comment: Quit 1987  . Alcohol Use: No  . Drug  Use: No  . Sexual Activity: Yes    Birth Control/ Protection: Surgical, Post-menopausal     Comment: widow   Other Topics Concern  . Not on file   Social History Narrative   Marital status: widowed since 2009; dating x 6 years.  Happy; no abuse.      Children: 2 children (31 daughter, 60 son estranged); 2 grandchildren.      Lives: with boyfriend, daughter, granddaughter, friend of daughter.  Lives in pt house.      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  Diabetic retinopathy; unable to drive.      Tobacco:  Smoked x 20 years; quit 20 years.      Alcohol:  Never.      Drugs:  None since college.       Exercise:  Walking several times per week; walks the dog.   Education college   Caffeine one cup daily.   Right handed            Family History  Problem Relation Age of Onset  . Adopted: Yes  . Family history unknown: Yes       Objective:    BP 122/80 mmHg  Pulse 106  Temp(Src) 97.7 F (36.5 C) (Oral)  Resp 16  Ht 5\' 2"  (1.575 m)  Wt 239 lb (108.41 kg)  BMI 43.70 kg/m2  SpO2 98% Physical Exam  Constitutional: She is oriented to person, place, and time. She appears well-developed and well-nourished. No distress.  HENT:  Head: Normocephalic and atraumatic.  Right Ear: External ear normal.  Left Ear: External ear normal.  Nose: Nose normal.  Mouth/Throat: Oropharynx is clear and moist.  Eyes: Conjunctivae and EOM are normal. Pupils are equal, round, and reactive to light.  Neck: Normal range of motion. Neck supple. Carotid bruit is not present. No thyromegaly present.  Cardiovascular: Normal rate, regular rhythm, normal heart sounds and intact distal pulses.  Exam reveals no gallop and no friction rub.   No murmur heard. R thigh medial distal without palpable cord; non-tender to palpation.  Pulmonary/Chest: Effort normal and breath sounds normal. She has no wheezes. She has no rales.  Abdominal: Soft. Bowel sounds are normal. She exhibits no distension and no mass. There is no tenderness. There is no rebound and no guarding.  Lymphadenopathy:    She has no cervical adenopathy.  Neurological: She is alert and oriented to person, place, and time. No cranial nerve deficit.  Skin: Skin is warm and dry. No rash noted. She is not diaphoretic. No erythema. No pallor.  Psychiatric: She has a normal mood and affect. Her behavior is normal.        Assessment & Plan:   1. Greater saphenous vein embolism, right   2. Need for prophylactic vaccination and inoculation against influenza   3. Diabetic peripheral neuropathy associated with type 2 diabetes mellitus ([place])   4.  Chronic renal insufficiency, stage 3 (moderate)   5. Therapeutic drug monitoring    -New. -Complete Lovenox therapy and then start Xarelto starter pack. -Obtain labs.   Orders Placed This Encounter  Procedures  . Flu Vaccine QUAD 36+ mos IM  . Comprehensive metabolic panel  . APTT  . Protime-INR  . POCT CBC   Meds ordered this encounter  Medications  . Rivaroxaban (XARELTO STARTER PACK) 15 & 20 MG TBPK    Sig: Take as directed on package: Start with one 15mg  tablet by mouth twice a day with food.  On Day 22, switch to one 20mg  tablet once a day with food.    Dispense:  51 each    Refill:  0    No Follow-up on file.    Gervase Colberg Elayne Guerin, M.D. [facility] [address] [place], [place]  [address] [phone] phone [phone] fax

## 2015-11-04 ENCOUNTER — Encounter: Payer: Self-pay | Admitting: Family Medicine

## 2015-11-04 MED ORDER — ESCITALOPRAM OXALATE 10 MG PO TABS: 10.0000 mg | ORAL_TABLET | Freq: Every day | ORAL | Status: DC

## 2015-11-04 MED ORDER — TRAZODONE HCL 100 MG PO TABS: 200.0000 mg | ORAL_TABLET | Freq: Every day | ORAL | Status: DC

## 2015-11-04 MED ORDER — OMEPRAZOLE 40 MG PO CPDR
40.0000 mg | DELAYED_RELEASE_CAPSULE | Freq: Every day | ORAL | Status: DC
Start: 2015-11-04 — End: 2017-03-11

## 2015-11-04 MED ORDER — INSULIN LISPRO 100 UNIT/ML ~~LOC~~ SOLN: 15.0000 [IU] | Freq: Three times a day (TID) | SUBCUTANEOUS | Status: DC

## 2015-11-04 MED ORDER — IPRATROPIUM BROMIDE 0.03 % NA SOLN: 2.0000 | Freq: Two times a day (BID) | NASAL | Status: DC

## 2015-11-04 MED ORDER — INSULIN DETEMIR 100 UNIT/ML FLEXPEN: 60.0000 [IU] | Freq: Every day | SUBCUTANEOUS | Status: DC

## 2015-11-04 MED ORDER — SIMVASTATIN 40 MG PO TABS: 40.0000 mg | ORAL_TABLET | Freq: Every day | ORAL | Status: DC

## 2015-11-04 MED ORDER — NYSTATIN 100000 UNIT/GM EX CREA: 1.0000 "application " | TOPICAL_CREAM | Freq: Two times a day (BID) | CUTANEOUS | Status: DC

## 2015-11-04 MED ORDER — FUROSEMIDE 20 MG PO TABS: 20.0000 mg | ORAL_TABLET | Freq: Every day | ORAL | Status: DC

## 2015-11-04 NOTE — Addendum Note (Signed)
Addended by: Wardell Honour on: 11/04/2015 04:04 PM   Modules accepted: Orders

## 2015-11-05 ENCOUNTER — Telehealth: Payer: Self-pay

## 2015-11-05 ENCOUNTER — Other Ambulatory Visit: Payer: Self-pay

## 2015-11-05 MED ORDER — INSULIN ASPART 100 UNIT/ML ~~LOC~~ SOLN: SUBCUTANEOUS | Status: DC

## 2015-11-05 MED ORDER — INSULIN DETEMIR 100 UNIT/ML FLEXPEN: 60.0000 [IU] | Freq: Every day | SUBCUTANEOUS | Status: DC

## 2015-11-05 NOTE — Telephone Encounter (Signed)
Resending Rx that printed out yesterday instead of going electronically.

## 2015-11-05 NOTE — Telephone Encounter (Signed)
I usually do not write for Humalog; I write for Novolog; thus must have been patient request.  I approved Novolog.

## 2015-11-05 NOTE — Telephone Encounter (Signed)
EPIC changed to "print" again, so I called in the Rx as written.

## 2015-11-05 NOTE — Telephone Encounter (Signed)
Dr Tamala Julian, I received fax stating that pt's ins will not cover the Humalog without first trying their preferred Novolog. Can we change it to Novolog, or is there a reason that pt can not use the Novolog? I will pend.

## 2015-11-13 ENCOUNTER — Other Ambulatory Visit: Payer: Self-pay | Admitting: Family Medicine

## 2015-11-14 NOTE — Telephone Encounter (Signed)
Dr Tamala Julian, pt needs a Rx to continue Xarelto after finishing starter pak. Pended, but wasn't sure quantity and RFs or how long you want pt taking this?

## 2015-11-16 NOTE — Telephone Encounter (Signed)
Call patient --- she needed six weeks of therapy with Lovenox and Xarelto which she has completed.  She can stop Xarelto and does not need a refill.  Can now only take ASA 325mg  daily.

## 2015-11-17 ENCOUNTER — Telehealth: Payer: Self-pay

## 2015-11-17 NOTE — Telephone Encounter (Signed)
PA needed for Angel French. Pt was Rxd this 10/10/15 when the Vesicare Dr Tamala Julian had written for needed a PA and pt needed to have tried Angel French and some other meds first. Completed PA for Angel French on covermymeds. Pending.

## 2015-11-17 NOTE — Telephone Encounter (Signed)
Called and advised pt to stop xarelto and start taking one 325 ASA QD. Pt stated that she already does this, and will continue to do so.

## 2015-11-20 NOTE — Telephone Encounter (Signed)
PA was denied because pt has not first tried/failed the preferred alternatives: Darifenacin ER, Oxybutynin or Oxybutynin ER, Tolterodine or Tolterodine ER, Trospium or Trospium ER. Do you want to Rx one of these formulary drugs?

## 2015-11-21 ENCOUNTER — Ambulatory Visit: Payer: 59 | Admitting: Family Medicine

## 2015-11-25 MED ORDER — OXYBUTYNIN CHLORIDE ER 5 MG PO TB24: 5.0000 mg | ORAL_TABLET | Freq: Every day | ORAL | Status: DC

## 2015-11-25 NOTE — Telephone Encounter (Signed)
I have sent in Oxybutynin ER 5mg  daily.  Please advise patient.

## 2015-11-26 ENCOUNTER — Ambulatory Visit (INDEPENDENT_AMBULATORY_CARE_PROVIDER_SITE_OTHER): Payer: BLUE CROSS/BLUE SHIELD | Admitting: Family Medicine

## 2015-11-26 ENCOUNTER — Ambulatory Visit (INDEPENDENT_AMBULATORY_CARE_PROVIDER_SITE_OTHER): Payer: BLUE CROSS/BLUE SHIELD

## 2015-11-26 VITALS — BP 130/80 | HR 90 | Temp 98.2°F | Resp 18 | Ht 62.5 in | Wt 243.5 lb

## 2015-11-26 DIAGNOSIS — M5432 Sciatica, left side: Secondary | ICD-10-CM

## 2015-11-26 DIAGNOSIS — M5136 Other intervertebral disc degeneration, lumbar region: Secondary | ICD-10-CM

## 2015-11-26 DIAGNOSIS — E113599 Type 2 diabetes mellitus with proliferative diabetic retinopathy without macular edema, unspecified eye: Secondary | ICD-10-CM | POA: Diagnosis not present

## 2015-11-26 DIAGNOSIS — N183 Chronic kidney disease, stage 3 unspecified: Secondary | ICD-10-CM

## 2015-11-26 DIAGNOSIS — M05741 Rheumatoid arthritis with rheumatoid factor of right hand without organ or systems involvement: Secondary | ICD-10-CM

## 2015-11-26 DIAGNOSIS — M51369 Other intervertebral disc degeneration, lumbar region without mention of lumbar back pain or lower extremity pain: Secondary | ICD-10-CM

## 2015-11-26 DIAGNOSIS — M05742 Rheumatoid arthritis with rheumatoid factor of left hand without organ or systems involvement: Secondary | ICD-10-CM

## 2015-11-26 DIAGNOSIS — N2889 Other specified disorders of kidney and ureter: Secondary | ICD-10-CM

## 2015-11-26 MED ORDER — GABAPENTIN 100 MG PO CAPS: 100.0000 mg | ORAL_CAPSULE | Freq: Three times a day (TID) | ORAL | Status: DC

## 2015-11-26 MED ORDER — OXYBUTYNIN CHLORIDE ER 10 MG PO TB24: 10.0000 mg | ORAL_TABLET | Freq: Every day | ORAL | Status: DC

## 2015-11-26 NOTE — Patient Instructions (Addendum)
Sciatica With Rehab The sciatic nerve runs from the back down the leg and is responsible for sensation and control of the muscles in the back (posterior) side of the thigh, lower leg, and foot. Sciatica is a condition that is characterized by inflammation of this nerve.  SYMPTOMS   Signs of nerve damage, including numbness and/or weakness along the posterior side of the lower extremity.  Pain in the back of the thigh that may also travel down the leg.  Pain that worsens when sitting for long periods of time.  Occasionally, pain in the back or buttock. CAUSES  Inflammation of the sciatic nerve is the cause of sciatica. The inflammation is due to something irritating the nerve. Common sources of irritation include:  Sitting for long periods of time.  Direct trauma to the nerve.  Arthritis of the spine.  Herniated or ruptured disk.  Slipping of the vertebrae (spondylolisthesis).  Pressure from soft tissues, such as muscles or ligament-like tissue (fascia). RISK INCREASES WITH:  Sports that place pressure or stress on the spine (football or weightlifting).  Poor strength and flexibility.  Failure to warm up properly before activity.  Family history of low back pain or disk disorders.  Previous back injury or surgery.  Poor body mechanics, especially when lifting, or poor posture. PREVENTION   Warm up and stretch properly before activity.  Maintain physical fitness:  Strength, flexibility, and endurance.  Cardiovascular fitness.  Learn and use proper technique, especially with posture and lifting. When possible, have coach correct improper technique.  Avoid activities that place stress on the spine. PROGNOSIS If treated properly, then sciatica usually resolves within 6 weeks. However, occasionally surgery is necessary.  RELATED COMPLICATIONS   Permanent nerve damage, including pain, numbness, tingle, or weakness.  Chronic back pain.  Risks of surgery: infection,  bleeding, nerve damage, or damage to surrounding tissues. TREATMENT Treatment initially involves resting from any activities that aggravate your symptoms. The use of ice and medication may help reduce pain and inflammation. The use of strengthening and stretching exercises may help reduce pain with activity. These exercises may be performed at home or with referral to a therapist. A therapist may recommend further treatments, such as transcutaneous electronic nerve stimulation (TENS) or ultrasound. Your caregiver may recommend corticosteroid injections to help reduce inflammation of the sciatic nerve. If symptoms persist despite non-surgical (conservative) treatment, then surgery may be recommended. MEDICATION  If pain medication is necessary, then nonsteroidal anti-inflammatory medications, such as aspirin and ibuprofen, or other minor pain relievers, such as acetaminophen, are often recommended.  Do not take pain medication for 7 days before surgery.  Prescription pain relievers may be given if deemed necessary by your caregiver. Use only as directed and only as much as you need.  Ointments applied to the skin may be helpful.  Corticosteroid injections may be given by your caregiver. These injections should be reserved for the most serious cases, because they may only be given a certain number of times. HEAT AND COLD  Cold treatment (icing) relieves pain and reduces inflammation. Cold treatment should be applied for 10 to 15 minutes every 2 to 3 hours for inflammation and pain and immediately after any activity that aggravates your symptoms. Use ice packs or massage the area with a piece of ice (ice massage).  Heat treatment may be used prior to performing the stretching and strengthening activities prescribed by your caregiver, physical therapist, or athletic trainer. Use a heat pack or soak the injury in warm water.   SEEK MEDICAL CARE IF:  Treatment seems to offer no benefit, or the condition  worsens.  Any medications produce adverse side effects. EXERCISES  RANGE OF MOTION (ROM) AND STRETCHING EXERCISES - Sciatica Most people with sciatic will find that their symptoms worsen with either excessive bending forward (flexion) or arching at the low back (extension). The exercises which will help resolve your symptoms will focus on the opposite motion. Your physician, physical therapist or athletic trainer will help you determine which exercises will be most helpful to resolve your low back pain. Do not complete any exercises without first consulting with your clinician. Discontinue any exercises which worsen your symptoms until you speak to your clinician. If you have pain, numbness or tingling which travels down into your buttocks, leg or foot, the goal of the therapy is for these symptoms to move closer to your back and eventually resolve. Occasionally, these leg symptoms will get better, but your low back pain may worsen; this is typically an indication of progress in your rehabilitation. Be certain to be very alert to any changes in your symptoms and the activities in which you participated in the 24 hours prior to the change. Sharing this information with your clinician will allow him/her to most efficiently treat your condition. These exercises may help you when beginning to rehabilitate your injury. Your symptoms may resolve with or without further involvement from your physician, physical therapist or athletic trainer. While completing these exercises, remember:   Restoring tissue flexibility helps normal motion to return to the joints. This allows healthier, less painful movement and activity.  An effective stretch should be held for at least 30 seconds.  A stretch should never be painful. You should only feel a gentle lengthening or release in the stretched tissue. FLEXION RANGE OF MOTION AND STRETCHING EXERCISES: STRETCH - Flexion, Single Knee to Chest   Lie on a firm bed or floor  with both legs extended in front of you.  Keeping one leg in contact with the floor, bring your opposite knee to your chest. Hold your leg in place by either grabbing behind your thigh or at your knee.  Pull until you feel a gentle stretch in your low back. Hold __________ seconds.  Slowly release your grasp and repeat the exercise with the opposite side. Repeat __________ times. Complete this exercise __________ times per day.  STRETCH - Flexion, Double Knee to Chest  Lie on a firm bed or floor with both legs extended in front of you.  Keeping one leg in contact with the floor, bring your opposite knee to your chest.  Tense your stomach muscles to support your back and then lift your other knee to your chest. Hold your legs in place by either grabbing behind your thighs or at your knees.  Pull both knees toward your chest until you feel a gentle stretch in your low back. Hold __________ seconds.  Tense your stomach muscles and slowly return one leg at a time to the floor. Repeat __________ times. Complete this exercise __________ times per day.  STRETCH - Low Trunk Rotation   Lie on a firm bed or floor. Keeping your legs in front of you, bend your knees so they are both pointed toward the ceiling and your feet are flat on the floor.  Extend your arms out to the side. This will stabilize your upper body by keeping your shoulders in contact with the floor.  Gently and slowly drop both knees together to one side until   you feel a gentle stretch in your low back. Hold for __________ seconds.  Tense your stomach muscles to support your low back as you bring your knees back to the starting position. Repeat the exercise to the other side. Repeat __________ times. Complete this exercise __________ times per day  EXTENSION RANGE OF MOTION AND FLEXIBILITY EXERCISES: STRETCH - Extension, Prone on Elbows  Lie on your stomach on the floor, a bed will be too soft. Place your palms about shoulder  width apart and at the height of your head.  Place your elbows under your shoulders. If this is too painful, stack pillows under your chest.  Allow your body to relax so that your hips drop lower and make contact more completely with the floor.  Hold this position for __________ seconds.  Slowly return to lying flat on the floor. Repeat __________ times. Complete this exercise __________ times per day.  RANGE OF MOTION - Extension, Prone Press Ups  Lie on your stomach on the floor, a bed will be too soft. Place your palms about shoulder width apart and at the height of your head.  Keeping your back as relaxed as possible, slowly straighten your elbows while keeping your hips on the floor. You may adjust the placement of your hands to maximize your comfort. As you gain motion, your hands will come more underneath your shoulders.  Hold this position __________ seconds.  Slowly return to lying flat on the floor. Repeat __________ times. Complete this exercise __________ times per day.  STRENGTHENING EXERCISES - Sciatica  These exercises may help you when beginning to rehabilitate your injury. These exercises should be done near your "sweet spot." This is the neutral, low-back arch, somewhere between fully rounded and fully arched, that is your least painful position. When performed in this safe range of motion, these exercises can be used for people who have either a flexion or extension based injury. These exercises may resolve your symptoms with or without further involvement from your physician, physical therapist or athletic trainer. While completing these exercises, remember:   Muscles can gain both the endurance and the strength needed for everyday activities through controlled exercises.  Complete these exercises as instructed by your physician, physical therapist or athletic trainer. Progress with the resistance and repetition exercises only as your caregiver advises.  You may  experience muscle soreness or fatigue, but the pain or discomfort you are trying to eliminate should never worsen during these exercises. If this pain does worsen, stop and make certain you are following the directions exactly. If the pain is still present after adjustments, discontinue the exercise until you can discuss the trouble with your clinician. STRENGTHENING - Deep Abdominals, Pelvic Tilt   Lie on a firm bed or floor. Keeping your legs in front of you, bend your knees so they are both pointed toward the ceiling and your feet are flat on the floor.  Tense your lower abdominal muscles to press your low back into the floor. This motion will rotate your pelvis so that your tail bone is scooping upwards rather than pointing at your feet or into the floor.  With a gentle tension and even breathing, hold this position for __________ seconds. Repeat __________ times. Complete this exercise __________ times per day.  STRENGTHENING - Abdominals, Crunches   Lie on a firm bed or floor. Keeping your legs in front of you, bend your knees so they are both pointed toward the ceiling and your feet are flat on the   floor. Cross your arms over your chest.  Slightly tip your chin down without bending your neck.  Tense your abdominals and slowly lift your trunk high enough to just clear your shoulder blades. Lifting higher can put excessive stress on the low back and does not further strengthen your abdominal muscles.  Control your return to the starting position. Repeat __________ times. Complete this exercise __________ times per day.  STRENGTHENING - Quadruped, Opposite UE/LE Lift  Assume a hands and knees position on a firm surface. Keep your hands under your shoulders and your knees under your hips. You may place padding under your knees for comfort.  Find your neutral spine and gently tense your abdominal muscles so that you can maintain this position. Your shoulders and hips should form a rectangle  that is parallel with the floor and is not twisted.  Keeping your trunk steady, lift your right hand no higher than your shoulder and then your left leg no higher than your hip. Make sure you are not holding your breath. Hold this position __________ seconds.  Continuing to keep your abdominal muscles tense and your back steady, slowly return to your starting position. Repeat with the opposite arm and leg. Repeat __________ times. Complete this exercise __________ times per day.  STRENGTHENING - Abdominals and Quadriceps, Straight Leg Raise   Lie on a firm bed or floor with both legs extended in front of you.  Keeping one leg in contact with the floor, bend the other knee so that your foot can rest flat on the floor.  Find your neutral spine, and tense your abdominal muscles to maintain your spinal position throughout the exercise.  Slowly lift your straight leg off the floor about 6 inches for a count of 15, making sure to not hold your breath.  Still keeping your neutral spine, slowly lower your leg all the way to the floor. Repeat this exercise with each leg __________ times. Complete this exercise __________ times per day. POSTURE AND BODY MECHANICS CONSIDERATIONS - Sciatica Keeping correct posture when sitting, standing or completing your activities will reduce the stress put on different body tissues, allowing injured tissues a chance to heal and limiting painful experiences. The following are general guidelines for improved posture. Your physician or physical therapist will provide you with any instructions specific to your needs. While reading these guidelines, remember:  The exercises prescribed by your provider will help you have the flexibility and strength to maintain correct postures.  The correct posture provides the optimal environment for your joints to work. All of your joints have less wear and tear when properly supported by a spine with good posture. This means you will  experience a healthier, less painful body.  Correct posture must be practiced with all of your activities, especially prolonged sitting and standing. Correct posture is as important when doing repetitive low-stress activities (typing) as it is when doing a single heavy-load activity (lifting). RESTING POSITIONS Consider which positions are most painful for you when choosing a resting position. If you have pain with flexion-based activities (sitting, bending, stooping, squatting), choose a position that allows you to rest in a less flexed posture. You would want to avoid curling into a fetal position on your side. If your pain worsens with extension-based activities (prolonged standing, working overhead), avoid resting in an extended position such as sleeping on your stomach. Most people will find more comfort when they rest with their spine in a more neutral position, neither too rounded nor too   arched. Lying on a non-sagging bed on your side with a pillow between your knees, or on your back with a pillow under your knees will often provide some relief. Keep in mind, being in any one position for a prolonged period of time, no matter how correct your posture, can still lead to stiffness. PROPER SITTING POSTURE In order to minimize stress and discomfort on your spine, you must sit with correct posture Sitting with good posture should be effortless for a healthy body. Returning to good posture is a gradual process. Many people can work toward this most comfortably by using various supports until they have the flexibility and strength to maintain this posture on their own. When sitting with proper posture, your ears will fall over your shoulders and your shoulders will fall over your hips. You should use the back of the chair to support your upper back. Your low back will be in a neutral position, just slightly arched. You may place a small pillow or folded towel at the base of your low back for support.  When  working at a desk, create an environment that supports good, upright posture. Without extra support, muscles fatigue and lead to excessive strain on joints and other tissues. Keep these recommendations in mind: CHAIR:   A chair should be able to slide under your desk when your back makes contact with the back of the chair. This allows you to work closely.  The chair's height should allow your eyes to be level with the upper part of your monitor and your hands to be slightly lower than your elbows. BODY POSITION  Your feet should make contact with the floor. If this is not possible, use a foot rest.  Keep your ears over your shoulders. This will reduce stress on your neck and low back. INCORRECT SITTING POSTURES   If you are feeling tired and unable to assume a healthy sitting posture, do not slouch or slump. This puts excessive strain on your back tissues, causing more damage and pain. Healthier options include:  Using more support, like a lumbar pillow.  Switching tasks to something that requires you to be upright or walking.  Talking a brief walk.  Lying down to rest in a neutral-spine position. PROLONGED STANDING WHILE SLIGHTLY LEANING FORWARD  When completing a task that requires you to lean forward while standing in one place for a long time, place either foot up on a stationary 2-4 inch high object to help maintain the best posture. When both feet are on the ground, the low back tends to lose its slight inward curve. If this curve flattens (or becomes too large), then the back and your other joints will experience too much stress, fatigue more quickly and can cause pain.  CORRECT STANDING POSTURES Proper standing posture should be assumed with all daily activities, even if they only take a few moments, like when brushing your teeth. As in sitting, your ears should fall over your shoulders and your shoulders should fall over your hips. You should keep a slight tension in your abdominal  muscles to brace your spine. Your tailbone should point down to the ground, not behind your body, resulting in an over-extended swayback posture.  INCORRECT STANDING POSTURES  Common incorrect standing postures include a forward head, locked knees and/or an excessive swayback. WALKING Walk with an upright posture. Your ears, shoulders and hips should all line-up. PROLONGED ACTIVITY IN A FLEXED POSITION When completing a task that requires you to bend forward   at your waist or lean over a low surface, try to find a way to stabilize 3 of 4 of your limbs. You can place a hand or elbow on your thigh or rest a knee on the surface you are reaching across. This will provide you more stability so that your muscles do not fatigue as quickly. By keeping your knees relaxed, or slightly bent, you will also reduce stress across your low back. CORRECT LIFTING TECHNIQUES DO :   Assume a wide stance. This will provide you more stability and the opportunity to get as close as possible to the object which you are lifting.  Tense your abdominals to brace your spine; then bend at the knees and hips. Keeping your back locked in a neutral-spine position, lift using your leg muscles. Lift with your legs, keeping your back straight.  Test the weight of unknown objects before attempting to lift them.  Try to keep your elbows locked down at your sides in order get the best strength from your shoulders when carrying an object.  Always ask for help when lifting heavy or awkward objects. INCORRECT LIFTING TECHNIQUES DO NOT:   Lock your knees when lifting, even if it is a small object.  Bend and twist. Pivot at your feet or move your feet when needing to change directions.  Assume that you cannot safely pick up a paperclip without proper posture.   This information is not intended to replace advice given to you by your health care provider. Make sure you discuss any questions you have with your health care provider.     Document Released: 10/18/2005 Document Revised: 03/04/2015 Document Reviewed: 01/30/2009 [employer] Interactive Patient Education [employer].  Because you received an x-ray today, you will receive an invoice from [facility]. Please contact [facility] at [phone] with questions or concerns regarding your invoice. Our billing staff will not be able to assist you with those questions.

## 2015-11-26 NOTE — Progress Notes (Signed)
Subjective:    Patient ID: Angel French, female    DOB: 1951-05-03, 65 y.o.   MRN: [id number]  11/26/2015  Leg Pain   HPI This 65 y.o. female presents for evaluation of the following:   1.  Urinary incontinence urge:  Angel French has not helped at all.  Now copay is $80/month; Continues to change pad six times per day.  Insurance will not pay for [employer].  Has been on [facility] for one month.  No slowing down.  Dr. Lorrene Reid told pt to sit and wait.    2.  L leg pain:  Onset of leg pain between ankle and waist/hip.  Went through buttocks and down; never goes beyond ankle.  L calf starts; the longer stands the higher it goes.  Pain gets severe. Never suffereed with sciatica.  Cannot laying on L hip for a nanosecond.  Severe pain with laying on L hip.   [place]are effects with Gabapentin.  No saddle paresthesias.    Review of Systems  Past Medical History  Diagnosis Date  . Diabetes mellitus   . Neuromuscular disorder ([place])   . Anxiety   . Depression   . Allergy     generic allergy pill; Spring and Fall only.  . Arthritis     DDD lumbar, R hip OA.  s/p ortho consult in past.  . Diabetic peripheral neuropathy associated with type 2 diabetes mellitus ([place])   . Blood transfusion without reported diagnosis     Mountain climbing accident in [place].  . Cataract     B retractions.  Marland Kitchen Ulcer     Peptic ulcer H. Pylori + s/p treatment.  Upper GI diagnosed.[name] Prilosec PRN .  Marland Kitchen Diabetic retinopathy associated with type 2 diabetes mellitus ([place])     s/p laser treatment multiple.  Unable to drive.  . Brachial plexus disorders   . Hypertension     controlled, off meds   . GERD (gastroesophageal reflux disease)   . Hyperlipidemia   . Chronic kidney disease     stage 3 per pt.   . Diabetic retinopathy ([place])   . Rheumatoid arthritis [facility])    Past Surgical History  Procedure Laterality Date  . Cholecystectomy    . Tonsillectomy    . Carpal tunnel release      Bilateral.  . Behavioral  helath admission      age 21; three months in [place].  . Cardiac catheterization  11/02/2007    normal coronary arteries.  . Eye surgery      Cataracts B. Laser surgery x 7 for Diabetic Retinopathy  . Cataract extraction, bilateral    . Abdominal surgery      staph abcess   . Abdominal hysterectomy  11/02/1979    DUB; cervical dysplasia; ovaries intact.   Allergies  Allergen Reactions  . Codeine Anaphylaxis  . Contrast Media [Iodinated Diagnostic Agents] Anaphylaxis  . Nitrofurantoin Monohyd Macro Anaphylaxis  . Folic Acid Itching  . Iodine Hives  . Red Dye Itching  . Ultram [Tramadol Hcl] Nausea And Vomiting   Current Outpatient Prescriptions  Medication Sig Dispense Refill  . aspirin 325 MG tablet Take 325 mg by mouth daily.     . Blood Glucose Monitoring Suppl (BLOOD GLUCOSE METER KIT AND SUPPLIES) KIT Dispense based on patient and insurance preference. Use up to four times daily as directed. (FOR ICD-9 250.00, 250.01). 1 each 11  . diclofenac sodium (VOLTAREN) 1 % GEL Apply 2 g topically 4 (  four) times daily. (Patient taking differently: Apply 2 g topically 2 (two) times daily as needed (pain). ) 100 g 3  . enoxaparin (LOVENOX) 100 MG/ML injection Inject 1 mL (100 mg total) into the skin every 12 (twelve) hours. 20 mL 0  . escitalopram (LEXAPRO) 10 MG tablet Take 1 tablet (10 mg total) by mouth daily. 30 tablet 5  . fesoterodine (TOVIAZ) 4 MG TB24 tablet Take 1 tablet (4 mg total) by mouth daily. 30 tablet 5  . FLUoxetine (PROZAC) 20 MG tablet Take 3 tablets (60 mg total) by mouth daily. 90 tablet 5  . furosemide (LASIX) 20 MG tablet Take 1 tablet (20 mg total) by mouth daily. 30 tablet 11  . HYDROcodone-acetaminophen (NORCO) 10-325 MG tablet Take 1 tablet by mouth every 6 (six) hours as needed. 100 tablet 0  . insulin aspart (NOVOLOG) 100 UNIT/ML injection Inject 15 - 20 units subcutaneously 3 times daily per sliding scale. 10 vial 11  . insulin detemir (LEVEMIR) 100 unit/ml  SOLN Inject 0.6 mLs (60 Units total) into the skin at bedtime. 20 mL 11  . insulin lispro (HUMALOG) 100 UNIT/ML injection Inject 0.15-0.2 mLs (15-20 Units total) into the skin 3 (three) times daily. Per sliding scale 10 mL 11  . Insulin Syringes, Disposable, U-100 0.5 ML MISC 28 Units by Does not apply route 2 (two) times daily. 100 each 11  . ipratropium (ATROVENT) 0.03 % nasal spray Place 2 sprays into the nose 2 (two) times daily. 30 mL 11  . leflunomide (ARAVA) 10 MG tablet Take 10 mg by mouth daily.  3  . Needles & Syringes MISC 1 Syringe by Does not apply route 2 (two) times daily. 100 each 11  . nystatin cream (MYCOSTATIN) Apply 1 application topically 2 (two) times daily. 90 g 5  . omeprazole (PRILOSEC) 40 MG capsule Take 1 capsule (40 mg total) by mouth daily. 30 capsule 11  . omeprazole (PRILOSEC) 40 MG capsule TAKE 1 CAPSULE BY MOUTH ONCE DAILY 90 capsule 3  . oxybutynin (DITROPAN-XL) 10 MG 24 hr tablet Take 1 tablet (10 mg total) by mouth at bedtime. 30 tablet 5  . oxyCODONE-acetaminophen (PERCOCET/ROXICET) 5-325 MG per tablet Take 1-2 tablets by mouth every 6 (six) hours as needed for severe pain. 15 tablet 0  . Rivaroxaban (XARELTO STARTER PACK) 15 & 20 MG TBPK Take as directed on package: Start with one '15mg'$  tablet by mouth twice a day with food. On Day 22, switch to one '20mg'$  tablet once a day with food. 51 each 0  . simvastatin (ZOCOR) 20 MG tablet TAKE 1 TABLET BY MOUTH ONCE DAILY AT BEDTIME 90 tablet 3  . simvastatin (ZOCOR) 40 MG tablet Take 1 tablet (40 mg total) by mouth at bedtime. 30 tablet 11  . solifenacin (VESICARE) 5 MG tablet Take 1 tablet (5 mg total) by mouth daily. 30 tablet 5  . traZODone (DESYREL) 100 MG tablet Take 2 tablets (200 mg total) by mouth at bedtime. 60 tablet 11  . gabapentin (NEURONTIN) 100 MG capsule Take 1 capsule (100 mg total) by mouth 3 (three) times daily. 90 capsule 0   No current facility-administered medications for this visit.   Social  History   Social History  . Marital Status: Married    Spouse Name: Advice worker  . Number of Children: 2  . Years of Education: college   Occupational History  . retired     retretied   Social History Main Topics  . Smoking  status: Former Games developer  . Smokeless tobacco: Never Used     Comment: Quit 1987  . Alcohol Use: No  . Drug Use: No  . Sexual Activity: Yes    Birth Control/ Protection: Surgical, Post-menopausal     Comment: widow   Other Topics Concern  . Not on file   Social History Narrative   Marital status: widowed since 2009; dating x 6 years.  Happy; no abuse.      Children: 2 children (35 daughter, 65 son estranged); 2 grandchildren.      Lives: with boyfriend, daughter, granddaughter, friend of daughter.  Lives in pt house.      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      Tobacco:  Smoked x 20 years; quit 20 years.      Alcohol:  Never.      Drugs:  None since college.      Exercise:  Walking several times per week; walks the dog.   Education college   Caffeine one cup daily.   Right handed            Family History  Problem Relation Age of Onset  . Adopted: Yes  . Family history unknown: Yes       Objective:    BP 130/80 mmHg  Pulse 90  Temp(Src) 98.2 F (36.8 C) (Oral)  Resp 18  Ht 5' 2.5" (1.588 m)  Wt 243 lb 8 oz (110.451 kg)  BMI 43.80 kg/m2  SpO2 98% Physical Exam Results for orders placed or performed in visit on 10/15/15  Comprehensive metabolic panel  Result Value Ref [place]   Sodium 140 135 - 146 mmol/L   Potassium 5.2 3.5 - 5.3 mmol/L   Chloride 105 98 - 110 mmol/L   CO2 29 20 - 31 mmol/L   Glucose, Bld 168 (H) 65 - 99 mg/dL   BUN 22 7 - 25 mg/dL   Creat 7.39 (H) 9.59 - 0.99 mg/dL   Total Bilirubin 0.4 0.2 - 1.2 mg/dL   Alkaline Phosphatase 77 33 - 130 U/L   AST 27 10 - 35 U/L   ALT 41 (H) 6 - 29 U/L   Total Protein 6.6 6.1 - 8.1 g/dL   Albumin 3.8 3.6 - 5.1  g/dL   Calcium 8.6 8.6 - 16.1 mg/dL  APTT  Result Value Ref Range   aPTT 35 24 - 37 seconds  Protime-INR  Result Value Ref Range   Prothrombin Time 13.1 11.6 - 15.2 seconds   INR 0.98 <1.50  POCT CBC  Result Value Ref Range   WBC 6.6 4.6 - 10.2 K/uL   Lymph, poc 1.4 0.6 - 3.4   POC LYMPH PERCENT 21.3 10 - 50 %L   MID (cbc) 0.4 0 - 0.9   POC MID % 5.8 0 - 12 %M   POC Granulocyte 4.8 2 - 6.9   Granulocyte percent 72.9 37 - 80 %G   RBC 3.54 (A) 4.04 - 5.48 M/uL   Hemoglobin 10.6 (A) 12.2 - 16.2 g/dL   HCT, POC 98.8 (A) 22.0 - 47.9 %   MCV 88.7 80 - 97 fL   MCH, POC 30.1 27 - 31.2 pg   MCHC 33.9 31.8 - 35.4 g/dL   RDW, POC 88.6 %   Platelet Count, POC 259 142 - 424 K/uL   MPV 6.5 0 - 99.8 fL   Dg Lumbar Spine Complete  11/26/2015  CLINICAL DATA:  Sciatica EXAM: LUMBAR SPINE - COMPLETE 4+ VIEW COMPARISON:  July 05, 2013 FINDINGS: All, lateral, spot lumbosacral lateral, and bilateral oblique views were obtained. There are 5 non-rib-bearing lumbar type vertebral bodies. There is lumbar levoscoliosis. There is no fracture or spondylolisthesis. There is moderately severe disc space narrowing at L3-4 L4-5, stable. There is mild disc space narrowing at L1-2 and L2-3. There are anterior osteophytes at L1, L2, L3, L4, stable. There is facet osteoarthritic change at all levels bilaterally, most notably at L4-5 and L5-S1 bilaterally. IMPRESSION: Scoliosis and multifocal osteoarthritic change. There has been a slight progression of osteoarthritic change compared to previous study, particularly at L2-3 where there is now vacuum phenomenon. No fracture or spondylolisthesis. Electronically Signed   By: Lowella Grip III M.D.   On: 11/26/2015 20:06   Dg Hip Unilat W Or W/o Pelvis 2-3 Views Left  11/26/2015  CLINICAL DATA:  Left hip and leg pain for 2 weeks. No known injury. Initial encounter. EXAM: DG HIP (WITH OR WITHOUT PELVIS) 2-3V LEFT COMPARISON:  None. FINDINGS: No acute bony or joint  abnormality is identified. Mild degenerative change is present about the hips. Lower lumbar degenerative disease is also identified. IMPRESSION: No acute abnormality. Mild bilateral hip degenerative disease. Lower lumbar spondylosis. Electronically Signed   By: Inge Rise M.D.   On: 11/26/2015 20:06      Assessment & Plan:   1. Left sided sciatica   2. Degenerative disc disease, lumbar   3. Rheumatoid arthritis involving both hands with positive rheumatoid factor ([place])     Orders Placed This Encounter  Procedures  . DG Lumbar Spine Complete    Standing Status: Future     Number of Occurrences: 1     Standing Expiration Date: 11/25/2016    Order Specific Question:  Reason for Exam (SYMPTOM  OR DIAGNOSIS REQUIRED)    Answer:  L sciatica pain; known DDD lumbar;L hip pain    Order Specific Question:  Preferred imaging location?    Answer:  External  . DG HIP UNILAT W OR W/O PELVIS 2-3 VIEWS LEFT    Standing Status: Future     Number of Occurrences: 1     Standing Expiration Date: 11/25/2016    Order Specific Question:  Reason for Exam (SYMPTOM  OR DIAGNOSIS REQUIRED)    Answer:  L sciatica pain; known DDD lumbar;L hip pain    Order Specific Question:  Preferred imaging location?    Answer:  External   Meds ordered this encounter  Medications  . oxybutynin (DITROPAN-XL) 10 MG 24 hr tablet    Sig: Take 1 tablet (10 mg total) by mouth at bedtime.    Dispense:  30 tablet    Refill:  5  . gabapentin (NEURONTIN) 100 MG capsule    Sig: Take 1 capsule (100 mg total) by mouth 3 (three) times daily.    Dispense:  90 capsule    Refill:  0    No Follow-up on file.    Kristi Elayne Guerin, M.D. [facility] [address] [place], [place]  [address] [phone] phone [phone] fax

## 2015-11-26 NOTE — Telephone Encounter (Signed)
Advised pt of new Rx, and she reported that the [name] had not helped her whatsoever anyway. I advised for her to try this new med and let us know how it works for her. We can go back and try PA again for Vesicare if she tried several preferred alternatives w/no effect. Pt agreed and stated she is coming to see Dr Tamala Julian tonight.

## 2015-12-22 ENCOUNTER — Other Ambulatory Visit: Payer: Self-pay | Admitting: Family Medicine

## 2015-12-23 ENCOUNTER — Telehealth: Payer: Self-pay

## 2015-12-23 MED ORDER — INSULIN ASPART 100 UNIT/ML ~~LOC~~ SOLN: SUBCUTANEOUS | Status: DC

## 2015-12-23 NOTE — Telephone Encounter (Signed)
[facility] pharm sent notice that Humalog is not coveredy by ins and want Rx for Novolog. Dr Tamala Julian already [place] this last month but sent to different pharm. I will re-send approved novolog to [facility]

## 2016-01-19 ENCOUNTER — Ambulatory Visit: Payer: Self-pay | Admitting: Family Medicine

## 2016-02-03 ENCOUNTER — Ambulatory Visit (INDEPENDENT_AMBULATORY_CARE_PROVIDER_SITE_OTHER): Payer: BLUE CROSS/BLUE SHIELD | Admitting: Family Medicine

## 2016-02-03 ENCOUNTER — Other Ambulatory Visit: Payer: Self-pay | Admitting: Family Medicine

## 2016-02-03 ENCOUNTER — Encounter: Payer: Self-pay | Admitting: Family Medicine

## 2016-02-03 ENCOUNTER — Telehealth: Payer: Self-pay | Admitting: *Deleted

## 2016-02-03 VITALS — BP 125/77 | HR 78 | Temp 98.4°F | Resp 16 | Ht 63.5 in | Wt 234.2 lb

## 2016-02-03 DIAGNOSIS — E113599 Type 2 diabetes mellitus with proliferative diabetic retinopathy without macular edema, unspecified eye: Secondary | ICD-10-CM

## 2016-02-03 DIAGNOSIS — D509 Iron deficiency anemia, unspecified: Secondary | ICD-10-CM

## 2016-02-03 DIAGNOSIS — M5432 Sciatica, left side: Secondary | ICD-10-CM | POA: Diagnosis not present

## 2016-02-03 DIAGNOSIS — F329 Major depressive disorder, single episode, unspecified: Secondary | ICD-10-CM | POA: Diagnosis not present

## 2016-02-03 DIAGNOSIS — E7800 Pure hypercholesterolemia, unspecified: Secondary | ICD-10-CM

## 2016-02-03 DIAGNOSIS — E1142 Type 2 diabetes mellitus with diabetic polyneuropathy: Secondary | ICD-10-CM | POA: Diagnosis not present

## 2016-02-03 DIAGNOSIS — M05742 Rheumatoid arthritis with rheumatoid factor of left hand without organ or systems involvement: Secondary | ICD-10-CM

## 2016-02-03 DIAGNOSIS — G894 Chronic pain syndrome: Secondary | ICD-10-CM

## 2016-02-03 DIAGNOSIS — F32A Depression, unspecified: Secondary | ICD-10-CM

## 2016-02-03 DIAGNOSIS — Z23 Encounter for immunization: Secondary | ICD-10-CM

## 2016-02-03 DIAGNOSIS — I10 Essential (primary) hypertension: Secondary | ICD-10-CM | POA: Diagnosis not present

## 2016-02-03 DIAGNOSIS — N183 Chronic kidney disease, stage 3 unspecified: Secondary | ICD-10-CM

## 2016-02-03 DIAGNOSIS — N6489 Other specified disorders of breast: Secondary | ICD-10-CM

## 2016-02-03 DIAGNOSIS — M05741 Rheumatoid arthritis with rheumatoid factor of right hand without organ or systems involvement: Secondary | ICD-10-CM | POA: Diagnosis not present

## 2016-02-03 DIAGNOSIS — M5136 Other intervertebral disc degeneration, lumbar region: Secondary | ICD-10-CM | POA: Diagnosis not present

## 2016-02-03 LAB — CBC WITH DIFFERENTIAL/PLATELET
Basophils Absolute: 0 cells/uL (ref 0–200)
Basophils Relative: 0 %
EOS PCT: 6 %
Eosinophils Absolute: 312 cells/uL (ref 15–500)
HCT: 34.2 % — ABNORMAL LOW (ref 35.0–45.0)
Hemoglobin: 11 g/dL — ABNORMAL LOW (ref 11.7–15.5)
LYMPHS ABS: 1820 {cells}/uL (ref 850–3900)
Lymphocytes Relative: 35 %
MCH: 28.8 pg (ref 27.0–33.0)
MCHC: 32.2 g/dL (ref 32.0–36.0)
MCV: 89.5 fL (ref 80.0–100.0)
MPV: 10.1 fL (ref 7.5–12.5)
Monocytes Absolute: 572 cells/uL (ref 200–950)
Monocytes Relative: 11 %
NEUTROS ABS: 2496 {cells}/uL (ref 1500–7800)
Neutrophils Relative %: 48 %
PLATELETS: 264 10*3/uL (ref 140–400)
RBC: 3.82 MIL/uL (ref 3.80–5.10)
RDW: 14.5 % (ref 11.0–15.0)
WBC: 5.2 10*3/uL (ref 3.8–10.8)

## 2016-02-03 LAB — COMPREHENSIVE METABOLIC PANEL
ALBUMIN: 3.8 g/dL (ref 3.6–5.1)
ALT: 25 U/L (ref 6–29)
AST: 24 U/L (ref 10–35)
Alkaline Phosphatase: 85 U/L (ref 33–130)
BUN: 30 mg/dL — AB (ref 7–25)
CHLORIDE: 102 mmol/L (ref 98–110)
CO2: 28 mmol/L (ref 20–31)
CREATININE: 1.46 mg/dL — AB (ref 0.50–0.99)
Calcium: 9.1 mg/dL (ref 8.6–10.4)
Glucose, Bld: 135 mg/dL — ABNORMAL HIGH (ref 65–99)
POTASSIUM: 4.2 mmol/L (ref 3.5–5.3)
SODIUM: 139 mmol/L (ref 135–146)
Total Bilirubin: 0.4 mg/dL (ref 0.2–1.2)
Total Protein: 6.9 g/dL (ref 6.1–8.1)

## 2016-02-03 LAB — IBC PANEL
%SAT: 26 % (ref 11–50)
TIBC: 309 ug/dL (ref 250–450)
UIBC: 230 ug/dL (ref 125–400)

## 2016-02-03 LAB — LIPID PANEL
CHOL/HDL RATIO: 4.6 ratio (ref <=5.0)
CHOLESTEROL: 180 mg/dL (ref 125–200)
HDL: 39 mg/dL — AB (ref >=46)
LDL Cholesterol: 105 mg/dL (ref <130)
Triglycerides: 179 mg/dL — ABNORMAL HIGH (ref <150)
VLDL: 36 mg/dL — AB (ref <30)

## 2016-02-03 LAB — POCT GLYCOSYLATED HEMOGLOBIN (HGB A1C): HEMOGLOBIN A1C: 8.6

## 2016-02-03 LAB — IRON: Iron: 79 ug/dL (ref 45–160)

## 2016-02-03 MED ORDER — HYDROCODONE-ACETAMINOPHEN 10-325 MG PO TABS: 1.0000 | ORAL_TABLET | Freq: Four times a day (QID) | ORAL | Status: DC | PRN

## 2016-02-03 MED ORDER — ZOSTER VACCINE LIVE 19400 UNT/0.65ML ~~LOC~~ SOLR: 0.6500 mL | Freq: Once | SUBCUTANEOUS | Status: DC

## 2016-02-03 MED ORDER — FLUCONAZOLE 150 MG PO TABS: 150.0000 mg | ORAL_TABLET | Freq: Once | ORAL | Status: DC

## 2016-02-03 MED ORDER — PREGABALIN 25 MG PO CAPS: 25.0000 mg | ORAL_CAPSULE | Freq: Two times a day (BID) | ORAL | Status: DC

## 2016-02-03 MED ORDER — ESCITALOPRAM OXALATE 20 MG PO TABS: 20.0000 mg | ORAL_TABLET | Freq: Every day | ORAL | Status: DC

## 2016-02-03 MED ORDER — OMEPRAZOLE 40 MG PO CPDR: 40.0000 mg | DELAYED_RELEASE_CAPSULE | Freq: Every day | ORAL | Status: DC

## 2016-02-03 NOTE — Telephone Encounter (Signed)
Faxed signed authorization medical records release form to [facility] for recent eye exam results. Confirmation page received at 4:24 pm.

## 2016-02-03 NOTE — Progress Notes (Signed)
Subjective:    Patient ID: Angel French, female    DOB: 1951/02/05, 65 y.o.   MRN: [id number]  02/03/2016  Follow-up; Medication Refill; and Vaginitis   HPI This 65 y.o. female presents for three month follow-up:  1.  Anxiety and depression: has gained 10 pounds.  Husband in rehab for past month.  Not doing well; has declined in past week; suffering with cellulitis; vascular surgery and ID managing cellulitis; has been delusional.  Might come home and might not come home.  Still does not know when coming home; leaving for cruise Saturday.  Has been crazy.  For three weeks was doing great.    2.  DMII: sugars are not that bad; 120 fastings; randoms are less than 150.  20 units with each meal; 30units Levemir when remembers to take Novolog.  60% chance of taking Novolog when home; does not use if out of house.    3.  Rheumatoid arthritis: horrible.  Started on infusion; has had two base treatments and not working well.  Follow-up tomorrow.  Due for first regular infusion in May; Prednisone gone.    4.  DDD lumbar: had mix up with hydrocodone; has been taking husband's vicodin.  Then started using Percocet of husband's; Percocet did not touch the pain.    4.  Hyperlipidemia: Patient reports good compliance with medication, good tolerance to medication, and good symptom control.      Review of Systems  Constitutional: Negative for fever, chills, diaphoresis and fatigue.  Eyes: Negative for visual disturbance.  Respiratory: Negative for cough and shortness of breath.   Cardiovascular: Negative for chest pain, palpitations and leg swelling.  Gastrointestinal: Negative for nausea, vomiting, abdominal pain, diarrhea and constipation.  Endocrine: Negative for cold intolerance, heat intolerance, polydipsia, polyphagia and polyuria.  Neurological: Negative for dizziness, tremors, seizures, syncope, facial asymmetry, speech difficulty, weakness, light-headedness, numbness and headaches.     Past Medical History  Diagnosis Date  . Diabetes mellitus   . Neuromuscular disorder ([place])   . Anxiety   . Depression   . Allergy     generic allergy pill; Spring and Fall only.  . Arthritis     DDD lumbar, R hip OA.  s/p ortho consult in past.  . Diabetic peripheral neuropathy associated with type 2 diabetes mellitus ([place])   . Blood transfusion without reported diagnosis     Mountain climbing accident in [place].  . Cataract     B retractions.  Marland Kitchen Ulcer     Peptic ulcer H. Pylori + s/p treatment.  Upper GI diagnosed.[name] Prilosec PRN .  Marland Kitchen Diabetic retinopathy associated with type 2 diabetes mellitus ([place])     s/p laser treatment multiple.  Unable to drive.  . Brachial plexus disorders   . Hypertension     controlled, off meds   . GERD (gastroesophageal reflux disease)   . Hyperlipidemia   . Chronic kidney disease     stage 3 per pt.   . Diabetic retinopathy ([place])   . Rheumatoid arthritis [facility])    Past Surgical History  Procedure Laterality Date  . Cholecystectomy    . Tonsillectomy    . Carpal tunnel release      Bilateral.  . Behavioral helath admission      age 23; three months in [place].  . Cardiac catheterization  11/02/2007    normal coronary arteries.  . Eye surgery      Cataracts B. Laser surgery x 7 for Diabetic  Retinopathy  . Cataract extraction, bilateral    . Abdominal surgery      staph abcess   . Abdominal hysterectomy  11/02/1979    DUB; cervical dysplasia; ovaries intact.   Allergies  Allergen Reactions  . Codeine Anaphylaxis  . Contrast Media [Iodinated Diagnostic Agents] Anaphylaxis  . Nitrofurantoin Monohyd Macro Anaphylaxis  . Folic Acid Itching  . Iodine Hives  . Red Dye Itching  . Ultram [Tramadol Hcl] Nausea And Vomiting    Social History   Social History  . Marital Status: Married    Spouse Name: Advice worker  . Number of Children: 2  . Years of Education: college   Occupational History  . retired     retretied    Social History Main Topics  . Smoking status: Former Research scientist (life sciences)  . Smokeless tobacco: Never Used     Comment: Quit 1987  . Alcohol Use: No  . Drug Use: No  . Sexual Activity: Yes    Birth Control/ Protection: Surgical, Post-menopausal     Comment: widow   Other Topics Concern  . Not on file   Social History Narrative   Marital status: widowed since 2009; dating x 6 years.  Happy; no abuse.      Children: 2 children (60 daughter, 43 son estranged); 2 grandchildren.      Lives: with boyfriend, daughter, granddaughter, friend of daughter.  Lives in pt house.      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  Diabetic retinopathy; unable to drive.      Tobacco:  Smoked x 20 years; quit 20 years.      Alcohol:  Never.      Drugs:  None since college.      Exercise:  Walking several times per week; walks the dog.   Education college   Caffeine one cup daily.   Right handed            Family History  Problem Relation Age of Onset  . Adopted: Yes  . Family history unknown: Yes       Objective:    BP 125/77 mmHg  Pulse 78  Temp(Src) 98.4 F (36.9 C) (Oral)  Resp 16  Ht 5' 3.5" (1.613 m)  Wt 234 lb 3.2 oz (106.232 kg)  BMI 40.83 kg/m2  SpO2 97% Physical Exam  Constitutional: She is oriented to person, place, and time. She appears well-developed and well-nourished. No distress.  HENT:  Head: Normocephalic and atraumatic.  Right Ear: External ear normal.  Left Ear: External ear normal.  Nose: Nose normal.  Mouth/Throat: Oropharynx is French and moist.  Eyes: Conjunctivae and EOM are normal. Pupils are equal, round, and reactive to light.  Neck: Normal range of motion. Neck supple. Carotid bruit is not present. No thyromegaly present.  Cardiovascular: Normal rate, regular rhythm, normal heart sounds and intact distal pulses.  Exam reveals no gallop and no friction rub.   No murmur heard. Pulmonary/Chest: Effort normal and breath sounds normal. She has no  wheezes. She has no rales.  Abdominal: Soft. Bowel sounds are normal. She exhibits no distension and no mass. There is no tenderness. There is no rebound and no guarding.  Lymphadenopathy:    She has no cervical adenopathy.  Neurological: She is alert and oriented to person, place, and time. No cranial nerve deficit.  Skin: Skin is warm and dry. No rash noted. She is not diaphoretic. No erythema. No pallor.  Psychiatric: She has a normal mood  and affect. Her behavior is normal.        Assessment & Plan:   1. Diabetic peripheral neuropathy associated with type 2 diabetes mellitus ([place])   2. 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 Rheumatoid arthritis involving both hands with positive rheumatoid factor ([place])   4. Chronic pain syndrome   5. Chronic renal insufficiency, stage 3 (moderate)   6. Degenerative disc disease, lumbar   7. Depression   8. Essential hypertension, benign   9. Pure hypercholesterolemia   10. Anemia, iron deficiency   11. Left sided sciatica   12. Need for shingles vaccine     Orders Placed This Encounter  Procedures  . CBC with Differential/Platelet  . Comprehensive metabolic panel    Order Specific Question:  Has the patient fasted?    Answer:  Yes  . Lipid panel    Order Specific Question:  Has the patient fasted?    Answer:  Yes  . Iron  . IBC panel  . Ambulatory referral to Orthopedic Surgery    Referral Priority:  Routine    Referral Type:  Surgical    Referral Reason:  Specialty Services Required    Requested Specialty:  Orthopedic Surgery    Number of Visits Requested:  1  . POCT glycosylated hemoglobin (Hb A1C)   Meds ordered this encounter  Medications  . cycloSPORINE (RESTASIS) 0.05 % ophthalmic emulsion    Sig: 1 drop 2 (two) times daily.  Marland Kitchen DISCONTD: HYDROcodone-acetaminophen (NORCO) 10-325 MG tablet    Sig: Take 1 tablet by mouth every 6 (six) hours as needed.     Dispense:  100 tablet    Refill:  0  . DISCONTD: HYDROcodone-acetaminophen (NORCO) 10-325 MG tablet    Sig: Take 1 tablet by mouth every 6 (six) hours as needed.    Dispense:  100 tablet    Refill:  0    Do not fill until 03-04-2016  . HYDROcodone-acetaminophen (NORCO) 10-325 MG tablet    Sig: Take 1 tablet by mouth every 6 (six) hours as needed.    Dispense:  100 tablet    Refill:  0    Do not fill until 04-04-2016  . escitalopram (LEXAPRO) 20 MG tablet    Sig: Take 1 tablet (20 mg total) by mouth daily.    Dispense:  30 tablet    Refill:  11  . omeprazole (PRILOSEC) 40 MG capsule    Sig: Take 1 capsule (40 mg total) by mouth daily.    Dispense:  90 capsule    Refill:  3  . pregabalin (LYRICA) 25 MG capsule    Sig: Take 1 capsule (25 mg total) by mouth 2 (two) times daily.    Dispense:  60 capsule    Refill:  2  . zoster vaccine live, PF, (ZOSTAVAX) 60454 UNT/0.65ML injection    Sig: Inject 19,400 Units into the skin once.    Dispense:  1 each    Refill:  0  . fluconazole (DIFLUCAN) 150 MG tablet    Sig: Take 1 tablet (150 mg total) by mouth once. Repeat if needed    Dispense:  2 tablet    Refill:  2    Return in about 4 months (around 06/04/2016) for recheck.    Kristi Elayne Guerin, M.D. [facility] [address] [place], [place]  [address] [phone] phone [phone] fax

## 2016-02-06 ENCOUNTER — Ambulatory Visit: Payer: 59 | Admitting: Family Medicine

## 2016-02-12 ENCOUNTER — Encounter: Payer: Self-pay | Admitting: Family Medicine

## 2016-02-18 ENCOUNTER — Other Ambulatory Visit: Payer: Self-pay | Admitting: Family Medicine

## 2016-02-18 ENCOUNTER — Other Ambulatory Visit: Payer: Self-pay

## 2016-02-18 DIAGNOSIS — N6489 Other specified disorders of breast: Secondary | ICD-10-CM

## 2016-02-19 ENCOUNTER — Ambulatory Visit
Admission: RE | Admit: 2016-02-19 | Discharge: 2016-02-19 | Disposition: A | Discharge status: Home / Self Care | Payer: BLUE CROSS/BLUE SHIELD | Source: Ambulatory Visit | Attending: Family Medicine | Admitting: Family Medicine

## 2016-02-19 DIAGNOSIS — N6489 Other specified disorders of breast: Secondary | ICD-10-CM

## 2016-02-22 ENCOUNTER — Encounter: Payer: Self-pay | Admitting: Family Medicine

## 2016-02-24 ENCOUNTER — Other Ambulatory Visit: Payer: Self-pay | Admitting: Family Medicine

## 2016-03-17 ENCOUNTER — Other Ambulatory Visit: Payer: Self-pay | Admitting: Orthopedic Surgery

## 2016-03-17 DIAGNOSIS — M5416 Radiculopathy, lumbar region: Secondary | ICD-10-CM

## 2016-03-30 ENCOUNTER — Ambulatory Visit
Admission: RE | Admit: 2016-03-30 | Discharge: 2016-03-30 | Disposition: A | Discharge status: Home / Self Care | Payer: BLUE CROSS/BLUE SHIELD | Source: Ambulatory Visit | Attending: Orthopedic Surgery | Admitting: Orthopedic Surgery

## 2016-03-30 DIAGNOSIS — M5416 Radiculopathy, lumbar region: Secondary | ICD-10-CM

## 2016-04-01 ENCOUNTER — Other Ambulatory Visit: Payer: Self-pay | Admitting: Family Medicine

## 2016-04-07 ENCOUNTER — Other Ambulatory Visit: Payer: Self-pay | Admitting: Physical Medicine and Rehabilitation

## 2016-04-07 DIAGNOSIS — M545 Low back pain, unspecified: Secondary | ICD-10-CM

## 2016-04-07 DIAGNOSIS — G8929 Other chronic pain: Secondary | ICD-10-CM

## 2016-04-13 ENCOUNTER — Ambulatory Visit
Admission: RE | Admit: 2016-04-13 | Discharge: 2016-04-13 | Disposition: A | Discharge status: Home / Self Care | Payer: BLUE CROSS/BLUE SHIELD | Source: Ambulatory Visit | Attending: Physical Medicine and Rehabilitation | Admitting: Physical Medicine and Rehabilitation

## 2016-04-13 DIAGNOSIS — G8929 Other chronic pain: Secondary | ICD-10-CM

## 2016-04-13 DIAGNOSIS — M545 Low back pain: Principal | ICD-10-CM

## 2016-04-13 MED ORDER — IOPAMIDOL (ISOVUE-M 200) INJECTION 41%: 1.0000 mL | Freq: Once | INTRAMUSCULAR | Status: DC

## 2016-04-13 MED ORDER — METHYLPREDNISOLONE ACETATE 40 MG/ML INJ SUSP (RADIOLOG
120.0000 mg | Freq: Once | INTRAMUSCULAR | Status: AC
  Administered 2016-04-13: 120 mg via EPIDURAL

## 2016-04-13 NOTE — Discharge Instructions (Signed)

## 2016-05-03 ENCOUNTER — Other Ambulatory Visit: Payer: Self-pay | Admitting: Family Medicine

## 2016-05-06 ENCOUNTER — Telehealth: Payer: Self-pay

## 2016-05-06 NOTE — Telephone Encounter (Signed)
rx for Lyrica faxed to [facility] [address]

## 2016-05-20 ENCOUNTER — Telehealth: Payer: Self-pay

## 2016-05-20 NOTE — Telephone Encounter (Signed)
Renee with [facility] called stating that patient wanted dr Tamala Julian to know that she has enrolled in case management  [phone] ext [phone]

## 2016-05-25 ENCOUNTER — Other Ambulatory Visit: Payer: Self-pay | Admitting: Family Medicine

## 2016-06-08 ENCOUNTER — Encounter: Payer: Self-pay | Admitting: Family Medicine

## 2016-06-08 ENCOUNTER — Ambulatory Visit (INDEPENDENT_AMBULATORY_CARE_PROVIDER_SITE_OTHER): Payer: BLUE CROSS/BLUE SHIELD | Admitting: Family Medicine

## 2016-06-08 VITALS — BP 124/76 | HR 106 | Temp 98.6°F | Resp 18 | Ht 63.5 in | Wt 226.0 lb

## 2016-06-08 DIAGNOSIS — I10 Essential (primary) hypertension: Secondary | ICD-10-CM

## 2016-06-08 DIAGNOSIS — E7800 Pure hypercholesterolemia, unspecified: Secondary | ICD-10-CM | POA: Diagnosis not present

## 2016-06-08 DIAGNOSIS — F329 Major depressive disorder, single episode, unspecified: Secondary | ICD-10-CM | POA: Diagnosis not present

## 2016-06-08 DIAGNOSIS — M05742 Rheumatoid arthritis with rheumatoid factor of left hand without organ or systems involvement: Secondary | ICD-10-CM | POA: Diagnosis not present

## 2016-06-08 DIAGNOSIS — M05741 Rheumatoid arthritis with rheumatoid factor of right hand without organ or systems involvement: Secondary | ICD-10-CM

## 2016-06-08 DIAGNOSIS — G894 Chronic pain syndrome: Secondary | ICD-10-CM | POA: Diagnosis not present

## 2016-06-08 DIAGNOSIS — N183 Chronic kidney disease, stage 3 unspecified: Secondary | ICD-10-CM

## 2016-06-08 DIAGNOSIS — E669 Obesity, unspecified: Secondary | ICD-10-CM | POA: Diagnosis not present

## 2016-06-08 DIAGNOSIS — F32A Depression, unspecified: Secondary | ICD-10-CM

## 2016-06-08 DIAGNOSIS — M5136 Other intervertebral disc degeneration, lumbar region: Secondary | ICD-10-CM

## 2016-06-08 DIAGNOSIS — E1142 Type 2 diabetes mellitus with diabetic polyneuropathy: Secondary | ICD-10-CM | POA: Diagnosis not present

## 2016-06-08 LAB — POCT CBC
GRANULOCYTE PERCENT: 49.3 % (ref 37–80)
HEMATOCRIT: 33 % — AB (ref 37.7–47.9)
Hemoglobin: 11.6 g/dL — AB (ref 12.2–16.2)
Lymph, poc: 1.8 (ref 0.6–3.4)
MCH, POC: 30.3 pg (ref 27–31.2)
MCHC: 35.1 g/dL (ref 31.8–35.4)
MCV: 86.4 fL (ref 80–97)
MID (CBC): 0.6 (ref 0–0.9)
MPV: 6 fL (ref 0–99.8)
POC GRANULOCYTE: 2.3 (ref 2–6.9)
POC LYMPH %: 38.1 % (ref 10–50)
POC MID %: 12.6 %M — AB (ref 0–12)
Platelet Count, POC: 220 10*3/uL (ref 142–424)
RBC: 3.82 M/uL — AB (ref 4.04–5.48)
RDW, POC: 15.2 %
WBC: 4.7 10*3/uL (ref 4.6–10.2)

## 2016-06-08 LAB — POCT GLYCOSYLATED HEMOGLOBIN (HGB A1C): HEMOGLOBIN A1C: 9.6

## 2016-06-08 MED ORDER — HYDROCODONE-ACETAMINOPHEN 10-325 MG PO TABS: 1.0000 | ORAL_TABLET | Freq: Four times a day (QID) | ORAL | 0 refills | Status: DC | PRN

## 2016-06-08 NOTE — Patient Instructions (Signed)
     IF you received an x-ray today, you will receive an invoice from [facility]. Please contact [facility] at [phone] with questions or concerns regarding your invoice.   IF you received labwork today, you will receive an invoice from [facility]. Please contact [facility] at [phone] with questions or concerns regarding your invoice.   Our billing staff will not be able to assist you with questions regarding bills from these companies.  You will be contacted with the lab results as soon as they are available. The fastest way to get your results is to activate your My Chart account. Instructions are located on the last page of this paperwork. If you have not heard from us regarding the results in 2 weeks, please contact this office.      

## 2016-06-08 NOTE — Progress Notes (Signed)
Patient ID: Angel French, female   DOB: 21-Oct-1951, 65 y.o.   MRN: [id number]   By signing my name below I, Tereasa Coop, attest that this documentation has been prepared under the direction and in the presence of Wardell Honour MD. Electonically Signed. Tereasa Coop, Scribe 06/08/2016 at 1:15 PM   Subjective:    Patient ID: Angel French, female    DOB: [birth year], 65 y.o.   MRN: [id number]  06/08/2016  Follow-up (4 month/overall health)   HPI  Angel French is a 65 y.o. female who presents to the [facility] for 4 month follow up evaluation of overall health/chronic medical conditions. Pt has history of HTN, HLD, DM, anxiety, depression, arthritis, chronic low back pain with lumbar DDD.  Pt has been getting back injections and doing physical therapy with phenomenal relief. Pt states she has been able to walk without a cane. Pt has been doing physical therapy for a month and a half. Pt states she still has 8/10 pain when she is not taking her Vicodin. Pt is taking flexeril every night which helps her control her back pain.  Pt reports rt forearm burn 3 weeks ago. Burn still hurts. Burn occurred when taking a rack out of the oven that while cooking and it tilted and landed on her forearm. Pt states that she did not feel the burn initially. Pt concerned about scarring and continued pain. Note pt's last tetanus shot was in December 2016.  Pt has been c/o being dry. Pt is taking lasix to control leg and hand swelling.  Pt reports that she is doing well emotionally.  Patient reports good compliance with medication, good tolerance to medication, and good symptom control.    Pt denies any CP, SOB, palpitations, persistent cough.  Pt has started having occasional choking episodes while drinking fluids or eating jello or bread Pt denies having trouble with meat. Pt has choking episodes 2 times a month. Pt denies reflux since she has been taking her PPI faithfully.   Got back  from a week long trip to [place] 10 hrs ago. Pt states she is "12lbs heavier because [she] has not pooped yet." Pt states that she normally does not have BMs when she is away from home. Pt states she really enjoyed her vacation.   Pt states she has not been taking her insulin faithfully because she forgets to take her insulin frequently. Pt has not been checking her blood sugar.   Pt reports that her vesicare has helped with urinary incontinence.   Pt requesting referral for nutritionist for diabetes, obesity, and hypercholesterolemia.   Review of Systems  Constitutional: Negative for chills, diaphoresis, fatigue and fever.  HENT: Negative for ear pain, postnasal drip, rhinorrhea, sinus pressure, sore throat and trouble swallowing.   Eyes: Negative for visual disturbance.  Respiratory: Negative for cough and shortness of breath.   Cardiovascular: Negative for chest pain, palpitations and leg swelling.  Gastrointestinal: Positive for constipation. Negative for abdominal pain, diarrhea, nausea and vomiting.  Endocrine: Negative for cold intolerance, heat intolerance, polydipsia, polyphagia and polyuria.  Musculoskeletal: Positive for arthralgias, back pain, gait problem, joint swelling and myalgias.  Neurological: Negative for dizziness, tremors, seizures, syncope, facial asymmetry, speech difficulty, weakness, light-headedness, numbness and headaches.  Psychiatric/Behavioral: Negative for dysphoric mood, self-injury, sleep disturbance and suicidal ideas. The patient is not nervous/anxious.     Past Medical History:  Diagnosis Date  . Allergy    generic allergy pill; Spring and  Fall only.  . Anxiety   . Arthritis    DDD lumbar, R hip OA.  s/p ortho consult in past.  . Blood transfusion without reported diagnosis    Mountain climbing accident in [place].  . Brachial plexus disorders   . Cataract    B retractions.  . Chronic kidney disease    stage 3 per pt.   . Depression   . Diabetes  mellitus   . Diabetic peripheral neuropathy associated with type 2 diabetes mellitus ([place])   . Diabetic retinopathy ([place])   . Diabetic retinopathy associated with type 2 diabetes mellitus ([place])    s/p laser treatment multiple.  Unable to drive.  Marland Kitchen GERD (gastroesophageal reflux disease)   . Hyperlipidemia   . Hypertension    controlled, off meds   . Neuromuscular disorder ([place])   . Rheumatoid arthritis ([place])   . Ulcer    Peptic ulcer H. Pylori + s/p treatment.  Upper GI diagnosed.[name] Prilosec PRN .   Past Surgical History:  Procedure Laterality Date  .  2 SPINAL INJECTIONS     . ABDOMINAL HYSTERECTOMY  11/02/1979   DUB; cervical dysplasia; ovaries intact.  . ABDOMINAL SURGERY     staph abcess   . [facility] Admission     age 41; three months in [place].  Marland Kitchen CARDIAC CATHETERIZATION  11/02/2007   normal coronary arteries.  . CARPAL TUNNEL RELEASE     Bilateral.  . CATARACT EXTRACTION, BILATERAL    . CHOLECYSTECTOMY    . EYE SURGERY     Cataracts B. Laser surgery x 7 for Diabetic Retinopathy  . TONSILLECTOMY     Allergies  Allergen Reactions  . Codeine Anaphylaxis  . Contrast Media [Iodinated Diagnostic Agents] Anaphylaxis  . Nitrofurantoin Monohyd Macro Anaphylaxis  . Betadine [Povidone Iodine] Itching  . Folic Acid Itching  . Iodine Hives  . Red Dye Itching  . Ultram [Tramadol Hcl] Nausea And Vomiting    Social History   Social History  . Marital status: Married    Spouse name: Marijean Niemann  . Number of children: 2  . Years of education: college   Occupational History  . retired     retretied   Social History Main Topics  . Smoking status: Former Research scientist (life sciences)  . Smokeless tobacco: Never Used     Comment: Quit 1987  . Alcohol use No  . Drug use: No  . Sexual activity: Yes    Birth control/ protection: Surgical, Post-menopausal     Comment: widow   Other Topics Concern  . Not on file   Social History Narrative   Marital status: widowed since  2009; dating x 6 years.  Happy; no abuse.      Children: 2 children (88 daughter, 51 son estranged); 2 grandchildren.      Lives: with boyfriend, daughter, granddaughter, friend of daughter.  Lives in pt house.      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  Diabetic retinopathy; unable to drive.      Tobacco:  Smoked x 20 years; quit 20 years.      Alcohol:  Never.      Drugs:  None since college.      Exercise:  Walking several times per week; walks the dog.   Education college   Caffeine one cup daily.   Right handed            Family History  Problem Relation Age of Onset  .  Adopted: Yes  . Family history unknown: Yes       Objective:    BP 124/76 (BP Location: Right Arm, Patient Position: Sitting, Cuff Size: Large)   Pulse (!) 106   Temp 98.6 F (37 C) (Oral)   Resp 18   Ht 5' 3.5" (1.613 m)   Wt 226 lb (102.5 kg)   SpO2 98%   BMI 39.41 kg/m  Physical Exam  Constitutional: She is oriented to person, place, and time. She appears well-developed and well-nourished. No distress.  HENT:  Head: Normocephalic and atraumatic.  Right Ear: External ear normal.  Left Ear: External ear normal.  Nose: Nose normal.  Mouth/Throat: Oropharynx is French and moist.  Eyes: Conjunctivae and EOM are normal. Pupils are equal, round, and reactive to light.  Neck: Normal range of motion. Neck supple. Carotid bruit is not present. Normal range of motion present. No thyromegaly present.  Cardiovascular: Normal rate, regular rhythm, normal heart sounds and intact distal pulses.  Exam reveals no gallop and no friction rub.   No murmur heard. Pulmonary/Chest: Effort normal and breath sounds normal. No respiratory distress. She has no decreased breath sounds. She has no wheezes. She has no rhonchi. She has no rales.  Abdominal: Soft. Normal appearance and bowel sounds are normal. She exhibits no distension and no mass. There is no hepatosplenomegaly. There is no tenderness.  There is no rigidity, no rebound, no guarding and no CVA tenderness. No hernia.  Musculoskeletal: Normal range of motion. She exhibits no edema or deformity.  Lymphadenopathy:    She has no cervical adenopathy.  Neurological: She is alert and oriented to person, place, and time. No cranial nerve deficit.  Skin: Skin is warm and dry. No rash noted. She is not diaphoretic. No erythema. No pallor.  Well healing burn/wound R forearm without erythema, fluctuance, tenderness, or drainage.  Psychiatric: She has a normal mood and affect. Her behavior is normal.  Nursing note and vitals reviewed.  Results for orders placed or performed in visit on 06/08/16  POCT glycosylated hemoglobin (Hb A1C)  Result Value Ref Range   Hemoglobin A1C 9.6   POCT CBC  Result Value Ref Range   WBC 4.7 4.6 - 10.2 K/uL   Lymph, poc 1.8 0.6 - 3.4   POC LYMPH PERCENT 38.1 10 - 50 %L   MID (cbc) 0.6 0 - 0.9   POC MID % 12.6 (A) 0 - 12 %M   POC Granulocyte 2.3 2 - 6.9   Granulocyte percent 49.3 37 - 80 %G   RBC 3.82 (A) 4.04 - 5.48 M/uL   Hemoglobin 11.6 (A) 12.2 - 16.2 g/dL   HCT, POC 33.0 (A) 37.7 - 47.9 %   MCV 86.4 80 - 97 fL   MCH, POC 30.3 27 - 31.2 pg   MCHC 35.1 31.8 - 35.4 g/dL   RDW, POC 15.2 %   Platelet Count, POC 220 142 - 424 K/uL   MPV 6.0 0 - 99.8 fL       Assessment & Plan:   1. Diabetic peripheral neuropathy associated with type 2 diabetes mellitus ([place])   2. Essential hypertension, benign   3. Degenerative disc disease, lumbar   4. Rheumatoid arthritis involving both hands with positive rheumatoid factor (HCC)   5. Chronic renal insufficiency, stage 3 (moderate)   6. Pure hypercholesterolemia   7. Depression   8. Obesity   9. Chronic pain syndrome     Orders Placed This Encounter  Procedures  .  Comprehensive metabolic panel    Order Specific Question:   Has the patient fasted?    Answer:   Yes  . Lipid panel    Order Specific Question:   Has the patient fasted?    Answer:    Yes  . Ambulatory referral to diabetic education    Referral Priority:   Routine    Referral Type:   Consultation    Referral Reason:   Specialty Services Required    Number of Visits Requested:   1  . POCT glycosylated hemoglobin (Hb A1C)  . POCT CBC   Meds ordered this encounter  Medications  . DISCONTD: HYDROcodone-acetaminophen (NORCO) 10-325 MG tablet    Sig: Take 1 tablet by mouth every 6 (six) hours as needed.    Dispense:  100 tablet    Refill:  0    Do not fill until 06-04-2016  . DISCONTD: HYDROcodone-acetaminophen (NORCO) 10-325 MG tablet    Sig: Take 1 tablet by mouth every 6 (six) hours as needed.    Dispense:  100 tablet    Refill:  0    Do not fill until 07-05-2016  . HYDROcodone-acetaminophen (NORCO) 10-325 MG tablet    Sig: Take 1 tablet by mouth every 6 (six) hours as needed.    Dispense:  100 tablet    Refill:  0    Do not fill until 08-04-2016   Recommended that pt use a vitamin E cream on burn after it finishes healing in 2 weeks.  Encourage improved compliance with insulin as HgbA1c is poorly controlled. Refill of hydrocodone provided; s/p recent injections by ortho with improvement. Refer to nutritionist for diabetic education and obesity and hypercholesterolemia. Well healing second degree burn R forearm. Consider modified barium swallow to evaluate swallowing.   Return in about 3 months (around 09/08/2016) for recheck diabetes, chronic pain.   I personally performed the services described in this documentation, which was scribed in my presence. The recorded information has been reviewed and considered.  Miriam Liles Elayne Guerin, M.D. [facility] [address] [place], [place]  [id number] [phone] phone [phone] fax

## 2016-06-09 LAB — COMPREHENSIVE METABOLIC PANEL
ALT: 34 U/L — ABNORMAL HIGH (ref 6–29)
AST: 27 U/L (ref 10–35)
Albumin: 4.1 g/dL (ref 3.6–5.1)
Alkaline Phosphatase: 80 U/L (ref 33–130)
BUN: 24 mg/dL (ref 7–25)
CHLORIDE: 101 mmol/L (ref 98–110)
CO2: 29 mmol/L (ref 20–31)
CREATININE: 1.3 mg/dL — AB (ref 0.50–0.99)
Calcium: 9.2 mg/dL (ref 8.6–10.4)
GLUCOSE: 106 mg/dL — AB (ref 65–99)
POTASSIUM: 4 mmol/L (ref 3.5–5.3)
SODIUM: 141 mmol/L (ref 135–146)
TOTAL PROTEIN: 6.7 g/dL (ref 6.1–8.1)
Total Bilirubin: 0.5 mg/dL (ref 0.2–1.2)

## 2016-06-09 LAB — LIPID PANEL
CHOL/HDL RATIO: 5.5 ratio — AB (ref <=5.0)
CHOLESTEROL: 240 mg/dL — AB (ref 125–200)
HDL: 44 mg/dL — ABNORMAL LOW (ref >=46)
LDL CALC: 142 mg/dL — AB (ref <130)
Triglycerides: 271 mg/dL — ABNORMAL HIGH (ref <150)
VLDL: 54 mg/dL — AB (ref <30)

## 2016-06-29 ENCOUNTER — Ambulatory Visit: Payer: BLUE CROSS/BLUE SHIELD | Admitting: Family Medicine

## 2016-07-22 ENCOUNTER — Encounter: Payer: BLUE CROSS/BLUE SHIELD | Attending: Family Medicine | Admitting: Dietician

## 2016-07-22 DIAGNOSIS — Z794 Long term (current) use of insulin: Secondary | ICD-10-CM | POA: Insufficient documentation

## 2016-07-22 DIAGNOSIS — E113599 Type 2 diabetes mellitus with proliferative diabetic retinopathy without macular edema, unspecified eye: Secondary | ICD-10-CM

## 2016-07-22 DIAGNOSIS — Z713 Dietary counseling and surveillance: Secondary | ICD-10-CM | POA: Insufficient documentation

## 2016-07-22 DIAGNOSIS — E11319 Type 2 diabetes mellitus with unspecified diabetic retinopathy without macular edema: Secondary | ICD-10-CM | POA: Insufficient documentation

## 2016-07-22 NOTE — Progress Notes (Signed)
  Medical Nutrition Therapy:  Appt start time: 1110 end time:  1215.   Assessment:  Primary concerns today: Angel French is here today since she wants to get back on track with diabetes. Used to have Hgb A1c in the 5 range around 2 years ago. One month ago it was 9.6%. Has trouble taking medication/insulin since she has diabetic retinopathy. Does not use the pen because of this. Feeling burned out and wants to get back on track. Does not drive since she does not see well.   Having stress and eating because of stress. Has to get up early to get granddaughter ready for school. Boyfriend had epidural absess needs help getting around (wheelchair and walker). Takes care of granddaughter after school until bedtime (lives with daughter, granddaughter, and granddaughter's boyfriend). Granddaughter has epilepsy and has seizures every day (grand mal every night 2-3 AM).   Rarely eats breakfast. Might not eat lunch until 4 PM. Dinners are "hit or miss"). Might eat after 8:30 PM. Gets involved in things and forgets to eat (feeds boyfriend and granddaughter).   Has back injections (steroids) and will be having surgery in January. Needs a walker to walk distance. Has rheumatoid arthritis too. Steroids could be making blood sugar go up.   Would like to get weight below 200 lbs.   Preferred Learning Style:   No preference indicated   Learning Readiness:   Ready  MEDICATIONS: insulin only   DIETARY INTAKE:  Usual eating pattern includes 2 meals and 0-2 snacks per day.  Avoided foods include: cauliflower, eggplant, ham, shrimp   24-hr recall:  B ( AM): none   Snk ( AM): none or rarely raisin bread with cream cheese or apples   L (1-4 PM): sandwiches (salami or meat or cheese) or soup and sandwich  Snk ( PM): none or something fast - fruit, chips D ( PM): baked potato with vegetable or sandwich  Snk ( PM): none or grazes on something salty - chips, pretzels, sweet rarely Beverages: coffee splenda and  creamer, hot tea with splenda, 1-2 diet mountain dew mountain dew  Usual physical activity: none  Estimated energy needs: 1400 calories 158 g carbohydrates 105 g protein 39 g fat  Progress Towards Goal(s):  In progress.   Nutritional Diagnosis:  [place]-2.1 Inpaired nutrition utilization As related to hx of diabetes.  As evidenced by Hgb A1c of 9.6%.    Intervention:  Nutrition counseling provided. Goals:  Follow Diabetes Meal Plan as instructed  Limit carbohydrate intake to 30-45 grams carbohydrate/meal  Limit carbohydrate intake to 0-15 grams carbohydrate/snack  Add lean protein foods to meals/snacks  Aim for 30 mins of physical activity daily  Try Premier Protein in morning with fruit or eggs/peanut butter with toast  Aim to fill half of your plate with vegetables at lunch and dinner  Add a protein to dinner meal  Try eating meals or snacks without distractions (TV or computer)  Try making extra food for [name] to have for dinner for yourself and [name].  Teaching Method Utilized:  Visual Auditory Hands on  Handouts given during visit include:  MyPlate  Living Well With Diabetes  Meal Card  15 g CHO Snacks  Barriers to learning/adherence to lifestyle change: stress, can't drive, limited mobility  Demonstrated degree of understanding via:  Teach Back   Monitoring/Evaluation:  Dietary intake, exercise, and body weight in 1 month(s).

## 2016-07-22 NOTE — Patient Instructions (Addendum)
Goals:  Follow Diabetes Meal Plan as instructed  Limit carbohydrate intake to 30-45 grams carbohydrate/meal  Limit carbohydrate intake to 0-15 grams carbohydrate/snack  Add lean protein foods to meals/snacks  Aim for 30 mins of physical activity daily  Try Premier Protein in morning with fruit or eggs/peanut butter with toast  Aim to fill half of your plate with vegetables at lunch and dinner  Add a protein to dinner meal  Try eating meals or snacks without distractions (TV or computer)  Try making extra food for Angel French to have for dinner for yourself and Angel French.

## 2016-08-12 ENCOUNTER — Ambulatory Visit (INDEPENDENT_AMBULATORY_CARE_PROVIDER_SITE_OTHER): Payer: BLUE CROSS/BLUE SHIELD | Admitting: Family Medicine

## 2016-08-12 VITALS — BP 150/68 | HR 104 | Temp 98.6°F | Resp 17 | Ht 62.0 in | Wt 229.0 lb

## 2016-08-12 DIAGNOSIS — Z23 Encounter for immunization: Secondary | ICD-10-CM

## 2016-08-12 DIAGNOSIS — M4807 Spinal stenosis, lumbosacral region: Secondary | ICD-10-CM

## 2016-08-12 DIAGNOSIS — J301 Allergic rhinitis due to pollen: Secondary | ICD-10-CM | POA: Diagnosis not present

## 2016-08-12 DIAGNOSIS — I878 Other specified disorders of veins: Secondary | ICD-10-CM

## 2016-08-12 DIAGNOSIS — M9983 Other biomechanical lesions of lumbar region: Secondary | ICD-10-CM

## 2016-08-12 DIAGNOSIS — G894 Chronic pain syndrome: Secondary | ICD-10-CM | POA: Diagnosis not present

## 2016-08-12 DIAGNOSIS — L989 Disorder of the skin and subcutaneous tissue, unspecified: Secondary | ICD-10-CM | POA: Diagnosis not present

## 2016-08-12 MED ORDER — IPRATROPIUM BROMIDE 0.03 % NA SOLN: 2.0000 | Freq: Two times a day (BID) | NASAL | 11 refills | Status: DC

## 2016-08-12 MED ORDER — FUROSEMIDE 20 MG PO TABS: 20.0000 mg | ORAL_TABLET | Freq: Two times a day (BID) | ORAL | 2 refills | Status: DC

## 2016-08-12 MED ORDER — DOXYCYCLINE HYCLATE 100 MG PO CAPS: 100.0000 mg | ORAL_CAPSULE | Freq: Two times a day (BID) | ORAL | 0 refills | Status: DC

## 2016-08-12 NOTE — Progress Notes (Signed)
Subjective:    Patient ID: Angel French, female    DOB: 1951-05-16, 65 y.o.   MRN: [id number]  08/12/2016  Mass (On stomach onset 1 month ago) and Medication Refill (atrovent)   HPI This 65 y.o. female presents for evaluation of abdominal wall mass.  Has a bump from one of Lovenox shots.  Husband is very worried about mass due to widowed wife with cancer.  Really painful. Husband is sitting on pens and needles. No redness, drainage.  DDD lumbar: s/p physical therapy; s/p two injections; scheduled for another injection in November before cruise.  Spinal stenosis is interfering with injection spreading; L foot is in horrible agony all the time.  R leg is pain free.  On the days when at home and doing laundry or watching television, only takes two pain pills.  Some days only takes one tablet.  Then when more active, L foot really hurts.  Dr. Golden Circle.    Allergic rhinitis: requesting refill of Atrovent; works really well.     Review of Systems  Constitutional: Negative for chills, diaphoresis, fatigue and fever.  Eyes: Negative for visual disturbance.  Respiratory: Negative for cough and shortness of breath.   Cardiovascular: Negative for chest pain, palpitations and leg swelling.  Gastrointestinal: Negative for abdominal pain, constipation, diarrhea, nausea and vomiting.  Endocrine: Negative for cold intolerance, heat intolerance, polydipsia, polyphagia and polyuria.  Musculoskeletal: Positive for back pain.  Neurological: Positive for numbness. Negative for dizziness, tremors, seizures, syncope, facial asymmetry, speech difficulty, light-headedness and headaches.    Past Medical History:  Diagnosis Date  . Allergy    generic allergy pill; Spring and Fall only.  . Anxiety   . Arthritis    DDD lumbar, R hip OA.  s/p ortho consult in past.  . Blood transfusion without reported diagnosis    Mountain climbing accident in [place].  . Brachial plexus disorders   . Cataract    B  retractions.  . Chronic kidney disease    stage 3 per pt.   . Depression   . Diabetes mellitus   . Diabetic peripheral neuropathy associated with type 2 diabetes mellitus ([place])   . Diabetic retinopathy ([place])   . Diabetic retinopathy associated with type 2 diabetes mellitus ([place])    s/p laser treatment multiple.  Unable to drive.  Marland Kitchen GERD (gastroesophageal reflux disease)   . Hyperlipidemia   . Hypertension    controlled, off meds   . Neuromuscular disorder ([place])   . Rheumatoid arthritis ([place])   . Ulcer ([place])    Peptic ulcer H. Pylori + s/p treatment.  Upper GI diagnosed.[name] Prilosec PRN .   Past Surgical History:  Procedure Laterality Date  .  2 SPINAL INJECTIONS     . ABDOMINAL HYSTERECTOMY  11/02/1979   DUB; cervical dysplasia; ovaries intact.  . ABDOMINAL SURGERY     staph abcess   . [facility]     age 80; three months in [place].  Marland Kitchen CARDIAC CATHETERIZATION  11/02/2007   normal coronary arteries.  . CARPAL TUNNEL RELEASE     Bilateral.  . CATARACT EXTRACTION, BILATERAL    . CHOLECYSTECTOMY    . EYE SURGERY     Cataracts B. Laser surgery x 7 for Diabetic Retinopathy  . TONSILLECTOMY     Allergies  Allergen Reactions  . Codeine Anaphylaxis  . Contrast Media [Iodinated Diagnostic Agents] Anaphylaxis  . Nitrofurantoin Monohyd Macro Anaphylaxis  . Betadine [Povidone Iodine] Itching  .  Folic Acid Itching  . Gabapentin Other (See Comments)    Makes patient feel drunk  . Iodine Hives  . Lyrica [Pregabalin] Other (See Comments)    Makes patient feel drunk  . Red Dye Itching  . Ultram [Tramadol Hcl] Nausea And Vomiting   Current Outpatient Prescriptions  Medication Sig Dispense Refill  . aspirin 325 MG tablet Take 325 mg by mouth daily.     . Blood Glucose Monitoring Suppl (BLOOD GLUCOSE METER KIT AND SUPPLIES) KIT Dispense based on patient and insurance preference. Use up to four times daily as directed. (FOR ICD-9 250.00, 250.01). 1 each 11  .  cyclobenzaprine (FLEXERIL) 5 MG tablet TAKE 1 TAB BY MOUTH AT BEDTIME  1  . diclofenac sodium (VOLTAREN) 1 % GEL Apply 2 g topically 4 (four) times daily. (Patient taking differently: Apply 2 g topically 2 (two) times daily as needed (pain). ) 100 g 3  . escitalopram (LEXAPRO) 20 MG tablet Take 1 tablet (20 mg total) by mouth daily. 30 tablet 11  . furosemide (LASIX) 20 MG tablet Take 1 tablet (20 mg total) by mouth 2 (two) times daily. 60 tablet 2  . HYDROcodone-acetaminophen (NORCO) 10-325 MG tablet Take 1 tablet by mouth every 6 (six) hours as needed. 100 tablet 0  . insulin aspart (NOVOLOG) 100 UNIT/ML injection Inject 15 - 20 units subcutaneously 3 times daily per sliding scale. 10 vial 10  . insulin detemir (LEVEMIR) 100 unit/ml SOLN Inject 0.6 mLs (60 Units total) into the skin at bedtime. 20 mL 11  . Insulin Syringes, Disposable, U-100 0.5 ML MISC 28 Units by Does not apply route 2 (two) times daily. 100 each 11  . ipratropium (ATROVENT) 0.03 % nasal spray Place 2 sprays into the nose 2 (two) times daily. 30 mL 11  . leflunomide (ARAVA) 10 MG tablet Take 10 mg by mouth daily.  3  . Needles & Syringes MISC 1 Syringe by Does not apply route 2 (two) times daily. 100 each 11  . nystatin cream (MYCOSTATIN) Apply 1 application topically 2 (two) times daily. 90 g 5  . omeprazole (PRILOSEC) 40 MG capsule Take 1 capsule (40 mg total) by mouth daily. 30 capsule 11  . oxybutynin (DITROPAN-XL) 10 MG 24 hr tablet TAKE 1 TAB BY MOUTH AT BEDTIME 90 tablet 0  . simvastatin (ZOCOR) 20 MG tablet TAKE 1 TABLET BY MOUTH ONCE DAILY AT BEDTIME 90 tablet 3  . solifenacin (VESICARE) 5 MG tablet Take 1 tablet (5 mg total) by mouth daily. 30 tablet 5  . traZODone (DESYREL) 100 MG tablet Take 2 tablets (200 mg total) by mouth at bedtime. 60 tablet 11  . ULTICARE INSULIN SYRINGE 31G X 5/16" 0.3 ML MISC USE AS DIRECTED TO INJECT INSULIN 2 TIMES DAILY 100 each 10  . doxycycline (VIBRAMYCIN) 100 MG capsule Take 1 capsule  (100 mg total) by mouth 2 (two) times daily. 14 capsule 0   No current facility-administered medications for this visit.    Social History   Social History  . Marital status: Married    Spouse name: Marijean Niemann  . Number of children: 2  . Years of education: college   Occupational History  . retired     retretied   Social History Main Topics  . Smoking status: Former Research scientist (life sciences)  . Smokeless tobacco: Never Used     Comment: Quit 1987  . Alcohol use No  . Drug use: No  . Sexual activity: Yes    Birth  control/ protection: Surgical, Post-menopausal     Comment: widow   Other Topics Concern  . Not on file   Social History Narrative   Marital status: widowed since 2009; dating x 6 years.  Happy; no abuse.      Children: 2 children (57 daughter, 99 son estranged); 2 grandchildren.      Lives: with boyfriend, daughter, granddaughter, friend of daughter.  Lives in pt house.      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  Diabetic retinopathy; unable to drive.      Tobacco:  Smoked x 20 years; quit 20 years.      Alcohol:  Never.      Drugs:  None since college.      Exercise:  Walking several times per week; walks the dog.   Education college   Caffeine one cup daily.   Right handed            Family History  Problem Relation Age of Onset  . Adopted: Yes  . Family history unknown: Yes       Objective:    BP (!) 150/68 (BP Location: Right Arm, Patient Position: Sitting, Cuff Size: Large)   Pulse (!) 104   Temp 98.6 F (37 C) (Oral)   Resp 17   Ht '5\' 2"'$  (1.575 m)   Wt 229 lb (103.9 kg)   SpO2 97%   BMI 41.88 kg/m  Physical Exam  Constitutional: She is oriented to person, place, and time. She appears well-developed and well-nourished. No distress.  HENT:  Head: Normocephalic and atraumatic.  Right Ear: External ear normal.  Left Ear: External ear normal.  Nose: Nose normal.  Mouth/Throat: Oropharynx is French and moist.  Eyes: Conjunctivae  and EOM are normal. Pupils are equal, round, and reactive to light.  Neck: Normal range of motion. Neck supple. Carotid bruit is not present. No thyromegaly present.  Cardiovascular: Normal rate, regular rhythm, normal heart sounds and intact distal pulses.  Exam reveals no gallop and no friction rub.   No murmur heard. Pulmonary/Chest: Effort normal and breath sounds normal. She has no wheezes. She has no rales.  Abdominal: Soft. Bowel sounds are normal. She exhibits no distension and no mass. There is no tenderness. There is no rebound and no guarding.  Lymphadenopathy:    She has no cervical adenopathy.  Neurological: She is alert and oriented to person, place, and time. No cranial nerve deficit.  Skin: Skin is warm and dry. No rash noted. She is not diaphoretic. No erythema. No pallor.     1.5cm x 2.0cm subcutaneous cystic mass along upper abdomen.  Psychiatric: She has a normal mood and affect. Her behavior is normal.   Results for orders placed or performed in visit on 08/12/16  WOUND CULTURE  Result Value Ref Range   Gram Stain Moderate    Gram Stain WBC present-both PMN and Mononuclear    Gram Stain No Squamous Epithelial Cells Seen    Gram Stain No Organisms Seen    Organism ID, Bacteria NO GROWTH 2 DAYS       PROCEDURE NOTE: SEE SEPARATE PROCEDURE NOTE. Assessment & Plan:   1. Skin lesion   2. Foraminal stenosis of lumbosacral region   3. Acute seasonal allergic rhinitis due to pollen   4. Venous stasis   5. Need for prophylactic vaccination and inoculation against influenza   6. Need for prophylactic vaccination and inoculation against viral hepatitis   7. Chronic pain  syndrome    -New.  S/p resection of skin lesion that was consistent with abscess.  Treat with Doxy; send wound culture; RTC 7 days for suture removal. -s/p physical therapy and two spinal injections for DDD lumbar; to undergo repeat injection prior to cruise; requiring less pain medication at this time.   Desires consultation with NS to discuss further surgical treatment options.  Referral placed. -refill of Atrovent. -s/p Hepatitis A and influenza vaccines.  Orders Placed This Encounter  Procedures  . WOUND CULTURE    Order Specific Question:   Source    Answer:   abdominal wall  . Flu Vaccine QUAD 36+ mos IM  . Hepatitis A vaccine adult IM  . Ambulatory referral to Neurosurgery    Referral Priority:   Routine    Referral Type:   Surgical    Referral Reason:   Specialty Services Required    Requested Specialty:   Neurosurgery    Number of Visits Requested:   1   Meds ordered this encounter  Medications  . cyclobenzaprine (FLEXERIL) 5 MG tablet    Sig: TAKE 1 TAB BY MOUTH AT BEDTIME    Refill:  1  . furosemide (LASIX) 20 MG tablet    Sig: Take 1 tablet (20 mg total) by mouth 2 (two) times daily.    Dispense:  60 tablet    Refill:  2  . ipratropium (ATROVENT) 0.03 % nasal spray    Sig: Place 2 sprays into the nose 2 (two) times daily.    Dispense:  30 mL    Refill:  11  . doxycycline (VIBRAMYCIN) 100 MG capsule    Sig: Take 1 capsule (100 mg total) by mouth 2 (two) times daily.    Dispense:  14 capsule    Refill:  0    No Follow-up on file.   Pegi Milazzo Elayne Guerin, M.D. [facility] [address] [place], [place]  [address] [phone] phone [phone] fax

## 2016-08-12 NOTE — Patient Instructions (Addendum)
WOUND CARE Please return in 7-10 days to have your stitches/staples removed or sooner if you have concerns. Marland Kitchen Keep area clean and dry for 24 hours. Do not remove bandage, if applied. . After 24 hours, remove bandage and wash wound gently with mild soap and warm water. Reapply a new bandage after cleaning wound, if directed. . Continue daily cleansing with soap and water until stitches/staples are removed. . Do not apply any ointments or creams to the wound while stitches/staples are in place, as this may cause delayed healing. . Notify the office if you experience any of the following signs of infection: Swelling, redness, pus drainage, streaking, fever >101.0 F . Notify the office if you experience excessive bleeding that does not stop after 15-20 minutes of constant, firm pressure.      IF you received an x-ray today, you will receive an invoice from [facility]. Please contact [facility] at [phone] with questions or concerns regarding your invoice.   IF you received labwork today, you will receive an invoice from [facility] [employer]. Please contact [facility] at [phone] with questions or concerns regarding your invoice.   Our billing staff will not be able to assist you with questions regarding bills from these companies.  You will be contacted with the lab results as soon as they are available. The fastest way to get your results is to activate your My Chart account. Instructions are located on the last page of this paperwork. If you have not heard from [name] regarding the results in 2 weeks, please contact this office.    Influenza (Flu) Vaccine (Inactivated or Recombinant):  1. Why get vaccinated? Influenza ("flu") is a contagious disease that spreads around the [place] every year, usually between October and May. Flu is caused by influenza viruses, and is spread mainly by coughing, sneezing, and close contact. Anyone can get  flu. Flu strikes suddenly and can last several days. Symptoms vary by age, but can include:  fever/chills  sore throat  muscle aches  fatigue  cough  headache  runny or stuffy nose Flu can also lead to pneumonia and blood infections, and cause diarrhea and seizures in children. If you have a medical condition, such as heart or lung disease, flu can make it worse. Flu is more dangerous for some people. Infants and young children, people 81 years of age and older, pregnant women, and people with certain health conditions or a weakened immune system are at greatest risk. Each year thousands of people in the [place] die from flu, and many more are hospitalized. Flu vaccine can:  keep you from getting flu,  make flu less severe if you do get it, and  keep you from spreading flu to your family and other people. 2. Inactivated and recombinant flu vaccines A dose of flu vaccine is recommended every flu season. Children 6 months through 48 years of age may need two doses during the same flu season. Everyone else needs only one dose each flu season. Some inactivated flu vaccines contain a very small amount of a mercury-based preservative called thimerosal. Studies have not shown thimerosal in vaccines to be harmful, but flu vaccines that do not contain thimerosal are available. There is no live flu virus in flu shots. They cannot cause the flu. There are many flu viruses, and they are always changing. Each year a new flu vaccine is made to protect against three or four viruses that are likely to cause disease in the upcoming  flu season. But even when the vaccine doesn't exactly match these viruses, it may still provide some protection. Flu vaccine cannot prevent:  flu that is caused by a virus not covered by the vaccine, or  illnesses that look like flu but are not. It takes about 2 weeks for protection to develop after vaccination, and protection lasts through the flu season. 3. Some  people should not get this vaccine Tell the person who is giving you the vaccine:  If you have any severe, life-threatening allergies. If you ever had a life-threatening allergic reaction after a dose of flu vaccine, or have a severe allergy to any part of this vaccine, you may be advised not to get vaccinated. Most, but not all, types of flu vaccine contain a small amount of egg protein.  If you ever had Guillain-Barre Syndrome (also called GBS). Some people with a history of GBS should not get this vaccine. This should be discussed with your doctor.  If you are not feeling well. It is usually okay to get flu vaccine when you have a mild illness, but you might be asked to come back when you feel better. 4. Risks of a vaccine reaction With any medicine, including vaccines, there is a chance of reactions. These are usually mild and go away on their own, but serious reactions are also possible. Most people who get a flu shot do not have any problems with it. Minor problems following a flu shot include:  soreness, redness, or swelling where the shot was given  hoarseness  sore, red or itchy eyes  cough  fever  aches  headache  itching  fatigue If these problems occur, they usually begin soon after the shot and last 1 or 2 days. More serious problems following a flu shot can include the following:  There may be a small increased risk of Guillain-Barre Syndrome (GBS) after inactivated flu vaccine. This risk has been estimated at 1 or 2 additional cases per million people vaccinated. This is much lower than the risk of severe complications from flu, which can be prevented by flu vaccine.  Young children who get the flu shot along with pneumococcal vaccine (PCV13) and/or DTaP vaccine at the same time might be slightly more likely to have a seizure caused by fever. Ask your doctor for more information. Tell your doctor if a child who is getting flu vaccine has ever had a seizure. Problems  that could happen after any injected vaccine:  People sometimes faint after a medical procedure, including vaccination. Sitting or lying down for about 15 minutes can help prevent fainting, and injuries caused by a fall. Tell your doctor if you feel dizzy, or have vision changes or ringing in the ears.  Some people get severe pain in the shoulder and have difficulty moving the arm where a shot was given. This happens very rarely.  Any medication can cause a severe allergic reaction. Such reactions from a vaccine are very rare, estimated at about 1 in a million doses, and would happen within a few minutes to a few hours after the vaccination. As with any medicine, there is a very remote chance of a vaccine causing a serious injury or death. The safety of vaccines is always being monitored. For more information, visit: http://www.aguilar.org/ 5. What if there is a serious reaction? What should I look for?  Look for anything that concerns you, such as signs of a severe allergic reaction, very high fever, or unusual behavior. Signs of a  severe allergic reaction can include hives, swelling of the face and throat, difficulty breathing, a fast heartbeat, dizziness, and weakness. These would start a few minutes to a few hours after the vaccination. What should I do?  If you think it is a severe allergic reaction or other emergency that can't wait, call 9-1-1 and get the person to the nearest hospital. Otherwise, call your doctor.  Reactions should be reported to the Vaccine Adverse Event Reporting System (VAERS). Your doctor should file this report, or you can do it yourself through the VAERS web site at www.vaers.SamedayNews.es, or by calling (763)879-4478. VAERS does not give medical advice. 6. The National Vaccine Injury Compensation Program The [employer] Vaccine Injury Compensation Program (VICP) is a federal program that was created to compensate people who may have been injured by certain  vaccines. Persons who believe they may have been injured by a vaccine can learn about the program and about filing a claim by calling 253-150-3271 or visiting the [place] website at GoldCloset.com.ee. There is a time limit to file a claim for compensation. 7. How can I learn more?  Ask your healthcare provider. He or she can give you the vaccine package insert or suggest other sources of information.  Call your local or state health department.  Contact the Centers for Disease Control and Prevention (CDC):  Call 618-311-9490 (1-800-CDC-INFO) or  Visit CDC's website at https://gibson.com/ Vaccine Information Statement Inactivated Influenza Vaccine (06/07/2014)   This information is not intended to replace advice given to you by your health care provider. Make sure you discuss any questions you have with your health care provider.   Document Released: 08/12/2006 Document Revised: 11/08/2014 Document Reviewed: 06/10/2014 Elsevier Interactive Patient Education [employer].

## 2016-08-16 LAB — WOUND CULTURE
GRAM STAIN: NONE SEEN
GRAM STAIN: NONE SEEN
ORGANISM ID, BACTERIA: NO GROWTH

## 2016-08-23 ENCOUNTER — Ambulatory Visit: Payer: BLUE CROSS/BLUE SHIELD | Admitting: Dietician

## 2016-08-23 DIAGNOSIS — I878 Other specified disorders of veins: Secondary | ICD-10-CM | POA: Insufficient documentation

## 2016-08-26 ENCOUNTER — Encounter: Payer: BLUE CROSS/BLUE SHIELD | Attending: Family Medicine | Admitting: Dietician

## 2016-08-26 DIAGNOSIS — Z713 Dietary counseling and surveillance: Secondary | ICD-10-CM | POA: Diagnosis not present

## 2016-08-26 DIAGNOSIS — E113599 Type 2 diabetes mellitus with proliferative diabetic retinopathy without macular edema, unspecified eye: Secondary | ICD-10-CM

## 2016-08-26 DIAGNOSIS — Z794 Long term (current) use of insulin: Secondary | ICD-10-CM | POA: Diagnosis not present

## 2016-08-26 DIAGNOSIS — E11319 Type 2 diabetes mellitus with unspecified diabetic retinopathy without macular edema: Secondary | ICD-10-CM | POA: Insufficient documentation

## 2016-08-26 NOTE — Patient Instructions (Addendum)
Goals:  Follow Diabetes Meal Plan as instructed  Limit carbohydrate intake to 30-45 grams carbohydrate/meal  Limit carbohydrate intake to 0-15 grams carbohydrate/snack  Add lean protein foods to meals/snacks  Aim for 30 mins of physical activity daily  Continue trying to get half of your plate filled with vegetables at lunch and dinner  Eat the pumpernickel bread instead diet bread  Try making extra food for [name] to have for dinner for yourself and [name].  Consider talking to therapist

## 2016-08-26 NOTE — Progress Notes (Signed)
  Medical Nutrition Therapy:  Appt start time: 1140 end time:  1225   Assessment:  Primary concerns today: Angel French is here today since she wants to get back on track with diabetes. Returns with with no weight change. Tried Premier Protein in her coffee and sometimes has it with fruit. Feeling very tired around 3 PM and sleeps for 2 hours. Has been eating "reasonable" breakfast and lunches. Eating a lot at night (strong cravings). Trying to keep things in the refrigerator that are healthier option. Blood sugar is a little better (about 20 points better). Averaging less than 150 mg/dl in the morning and before bed averaging 150-200 mg/dl. Will be having another Hgb A1c on 11/8. Feels like she is still bad about taking her insulin. Has been taking it since 1999 and it "gets old".   Cannot eat lettuce since she is missing some teeth (other vegetables are good). Feels like she has made a lot of changes but sometimes "doesn't care" what she eats. Has done some eating at night while she is asleep (3 times in the past month).   Still having a lot of stress. Will be going on a cruise in 3 weeks and planning to follow diet closely before then.   Eating more protein than 3-4 oz but cutting down on carbs (potatoes). Feels like she wants to eat large portions but working on it.   Would like to get weight below 200 lbs.   Preferred Learning Style:   No preference indicated   Learning Readiness:   Ready  MEDICATIONS: insulin only   DIETARY INTAKE:  Usual eating pattern includes 2 meals and 0-2 snacks per day.  Avoided foods include: cauliflower, eggplant, ham, shrimp   24-hr recall:  B ( AM): Premier with coffee and sometimes fruit Snk ( AM): none usually, sometimes juice or piece of fruit   L (PM): red pepper/tomato with tuna or 1-2 roast beef sandwiches (pumpernickel or diet bread) Snk ( PM): none or cookies  D (5-9PM): roast chicken with vegetables or shepard's pie or fish or vegetable barley  soup with crackers Snk (10 PM): none or 3 sugar free jello, grazes on something salty - chips, pretzels (less than before), sweets sometimes Beverages: coffee with protein shake, hot tea with splenda, 1-2 diet mountain dew mountain dew  Usual physical activity: none  Estimated energy needs: 1400 calories 158 g carbohydrates 105 g protein 39 g fat  Progress Towards Goal(s):  In progress.   Nutritional Diagnosis:  [place]-2.1 Inpaired nutrition utilization As related to hx of diabetes.  As evidenced by Hgb A1c of 9.6%.    Intervention:  Nutrition counseling provided. Goals:  Follow Diabetes Meal Plan as instructed  Limit carbohydrate intake to 30-45 grams carbohydrate/meal  Limit carbohydrate intake to 0-15 grams carbohydrate/snack  Add lean protein foods to meals/snacks  Aim for 30 mins of physical activity daily  Continue trying to get half of your plate filled with vegetables at lunch and dinner  Eat the pumpernickel bread instead diet bread  Try making extra food for Angel French to have for dinner for yourself and Angel French.  Consider talking to therapist  Teaching Method Utilized:  Visual Auditory Hands on  Handouts given during visit include:  Therapists in the area  Barriers to learning/adherence to lifestyle change: stress, can't drive, limited mobility  Demonstrated degree of understanding via:  Teach Back   Monitoring/Evaluation:  Dietary intake, exercise, and body weight in 2 week(s).

## 2016-08-30 ENCOUNTER — Other Ambulatory Visit: Payer: Self-pay | Admitting: Family Medicine

## 2016-09-07 ENCOUNTER — Encounter: Payer: BLUE CROSS/BLUE SHIELD | Attending: Family Medicine | Admitting: Dietician

## 2016-09-07 DIAGNOSIS — E11319 Type 2 diabetes mellitus with unspecified diabetic retinopathy without macular edema: Secondary | ICD-10-CM

## 2016-09-07 DIAGNOSIS — Z794 Long term (current) use of insulin: Secondary | ICD-10-CM | POA: Insufficient documentation

## 2016-09-07 DIAGNOSIS — Z713 Dietary counseling and surveillance: Secondary | ICD-10-CM | POA: Diagnosis not present

## 2016-09-07 NOTE — Patient Instructions (Addendum)
Goals:  Continue to lean protein foods to meals/snacks  Continue to use your exercise bands  Continue trying to get half of your plate filled with vegetables at lunch and dinner  Plan to just eat vegetables at night   Have fun on your cruise!!

## 2016-09-07 NOTE — Progress Notes (Signed)
  Medical Nutrition Therapy:  Appt start time: 200 end time:  230   Assessment:  Primary concerns today: Angel French is here today since she wants to get back on track with diabetes. Starting to go to a Restaurant manager, fast food. Will be going a therapist at [place] on 11/21. Getting rid of foods that are "sabotaging" her. Talked to her boyfriend about not buying her sweets. Gave up diet soda.   Feels like dinner is still too large. Starts to graze at night after dinner.   Blood sugar is 135-160 mg/dl. Will be getting her Hgb A1c tomorrow.   Cooking less since her back and foot hurt. Having another injection in her foot before her cruise (leaves for cruise on 11/23).  Planning to give up meat after her cruise in December.   Cannot eat lettuce since she is missing some teeth (other vegetables are good). Feels like she has made a lot of changes but sometimes "doesn't care" what she eats. Has done some eating at night while she is asleep (3 times in the past month).   Would like to get weight below 200 lbs.   Preferred Learning Style:   No preference indicated   Learning Readiness:   Ready  MEDICATIONS: insulin only   DIETARY INTAKE:  Usual eating pattern includes 2 meals and 0-2 snacks per day.  Avoided foods include: cauliflower, eggplant, ham, shrimp   24-hr recall:  B ( AM): Premier and coffee and sometimes fruit Snk ( AM): none usually, sometimes juice or piece of fruit   L (PM): red pepper/tomato with tuna or 1-2 roast beef sandwiches (pumpernickel or diet bread) Snk ( PM): none  D (5-9PM): roast chicken with vegetables or shepard's pie or fish or vegetable barley soup with crackers Snk (10 PM): none or 3 sugar free jello, grazes on something salty - chips, pretzels (less than before), sweets sometimes Beverages: coffee with protein shake, hot tea with splenda, 50/50 orange juice  Usual physical activity: exercise bands   Estimated energy needs: 1400 calories 158 g  carbohydrates 105 g protein 39 g fat  Progress Towards Goal(s):  In progress.   Nutritional Diagnosis:  Angel French-2.1 Inpaired nutrition utilization As related to hx of diabetes.  As evidenced by Hgb A1c of 9.6%.    Intervention:  Nutrition counseling provided. Goals:  Follow Diabetes Meal Plan as instructed  Limit carbohydrate intake to 30-45 grams carbohydrate/meal  Limit carbohydrate intake to 0-15 grams carbohydrate/snack  Add lean protein foods to meals/snacks  Aim for 30 mins of physical activity daily  Continue trying to get half of your plate filled with vegetables at lunch and dinner  Eat the pumpernickel bread instead diet bread  Try making extra food for Angel French to have for dinner for yourself and Angel French.  Consider talking to therapist  Teaching Method Utilized:  Visual Auditory Hands on  Handouts given during visit include:  Therapists in the area  Barriers to learning/adherence to lifestyle change: stress, can't drive, limited mobility  Demonstrated degree of understanding via:  Teach Back   Monitoring/Evaluation:  Dietary intake, exercise, and body weight in 1 month(s).

## 2016-09-08 ENCOUNTER — Encounter: Payer: Self-pay | Admitting: Family Medicine

## 2016-09-08 ENCOUNTER — Ambulatory Visit (INDEPENDENT_AMBULATORY_CARE_PROVIDER_SITE_OTHER): Payer: BLUE CROSS/BLUE SHIELD | Admitting: Family Medicine

## 2016-09-08 VITALS — BP 118/60 | HR 101 | Temp 98.7°F | Resp 16 | Ht 62.5 in | Wt 228.0 lb

## 2016-09-08 DIAGNOSIS — E7800 Pure hypercholesterolemia, unspecified: Secondary | ICD-10-CM

## 2016-09-08 DIAGNOSIS — E113593 Type 2 diabetes mellitus with proliferative diabetic retinopathy without macular edema, bilateral: Secondary | ICD-10-CM | POA: Diagnosis not present

## 2016-09-08 DIAGNOSIS — N183 Chronic kidney disease, stage 3 unspecified: Secondary | ICD-10-CM

## 2016-09-08 DIAGNOSIS — M05742 Rheumatoid arthritis with rheumatoid factor of left hand without organ or systems involvement: Secondary | ICD-10-CM

## 2016-09-08 DIAGNOSIS — M05741 Rheumatoid arthritis with rheumatoid factor of right hand without organ or systems involvement: Secondary | ICD-10-CM

## 2016-09-08 DIAGNOSIS — E1142 Type 2 diabetes mellitus with diabetic polyneuropathy: Secondary | ICD-10-CM

## 2016-09-08 DIAGNOSIS — M51369 Other intervertebral disc degeneration, lumbar region without mention of lumbar back pain or lower extremity pain: Secondary | ICD-10-CM

## 2016-09-08 DIAGNOSIS — I878 Other specified disorders of veins: Secondary | ICD-10-CM

## 2016-09-08 DIAGNOSIS — F329 Major depressive disorder, single episode, unspecified: Secondary | ICD-10-CM | POA: Diagnosis not present

## 2016-09-08 DIAGNOSIS — G894 Chronic pain syndrome: Secondary | ICD-10-CM

## 2016-09-08 DIAGNOSIS — M5136 Other intervertebral disc degeneration, lumbar region: Secondary | ICD-10-CM

## 2016-09-08 DIAGNOSIS — N2889 Other specified disorders of kidney and ureter: Secondary | ICD-10-CM

## 2016-09-08 DIAGNOSIS — I10 Essential (primary) hypertension: Secondary | ICD-10-CM

## 2016-09-08 LAB — CBC WITH DIFFERENTIAL/PLATELET
Basophils Absolute: 57 cells/uL (ref 0–200)
Basophils Relative: 1 %
EOS PCT: 8 %
Eosinophils Absolute: 456 cells/uL (ref 15–500)
HCT: 35.1 % (ref 35.0–45.0)
HEMOGLOBIN: 11.4 g/dL — AB (ref 11.7–15.5)
LYMPHS ABS: 2280 {cells}/uL (ref 850–3900)
Lymphocytes Relative: 40 %
MCH: 29.8 pg (ref 27.0–33.0)
MCHC: 32.5 g/dL (ref 32.0–36.0)
MCV: 91.9 fL (ref 80.0–100.0)
MONOS PCT: 9 %
MPV: 9.3 fL (ref 7.5–12.5)
Monocytes Absolute: 513 cells/uL (ref 200–950)
NEUTROS PCT: 42 %
Neutro Abs: 2394 cells/uL (ref 1500–7800)
PLATELETS: 229 10*3/uL (ref 140–400)
RBC: 3.82 MIL/uL (ref 3.80–5.10)
RDW: 13.3 % (ref 11.0–15.0)
WBC: 5.7 10*3/uL (ref 3.8–10.8)

## 2016-09-08 LAB — COMPREHENSIVE METABOLIC PANEL
ALBUMIN: 3.7 g/dL (ref 3.6–5.1)
ALT: 28 U/L (ref 6–29)
AST: 31 U/L (ref 10–35)
Alkaline Phosphatase: 71 U/L (ref 33–130)
BILIRUBIN TOTAL: 0.4 mg/dL (ref 0.2–1.2)
BUN: 13 mg/dL (ref 7–25)
CHLORIDE: 103 mmol/L (ref 98–110)
CO2: 33 mmol/L — AB (ref 20–31)
CREATININE: 1.3 mg/dL — AB (ref 0.50–0.99)
Calcium: 9.1 mg/dL (ref 8.6–10.4)
GLUCOSE: 58 mg/dL — AB (ref 65–99)
Potassium: 4 mmol/L (ref 3.5–5.3)
SODIUM: 142 mmol/L (ref 135–146)
Total Protein: 6.4 g/dL (ref 6.1–8.1)

## 2016-09-08 LAB — LIPID PANEL
Cholesterol: 203 mg/dL — ABNORMAL HIGH (ref <200)
HDL: 38 mg/dL — ABNORMAL LOW (ref >50)
LDL CALC: 119 mg/dL — AB
Total CHOL/HDL Ratio: 5.3 Ratio — ABNORMAL HIGH (ref <5.0)
Triglycerides: 232 mg/dL — ABNORMAL HIGH (ref <150)
VLDL: 46 mg/dL — AB (ref <30)

## 2016-09-08 LAB — POCT GLYCOSYLATED HEMOGLOBIN (HGB A1C): HEMOGLOBIN A1C: 9.1

## 2016-09-08 LAB — GLUCOSE, POCT (MANUAL RESULT ENTRY): POC GLUCOSE: 56 mg/dL — AB (ref 70–99)

## 2016-09-08 MED ORDER — HYDROCODONE-ACETAMINOPHEN 10-325 MG PO TABS: 1.0000 | ORAL_TABLET | Freq: Four times a day (QID) | ORAL | 0 refills | Status: DC | PRN

## 2016-09-08 MED ORDER — HYDROXYZINE HCL 25 MG PO TABS: 12.5000 mg | ORAL_TABLET | Freq: Every evening | ORAL | 5 refills | Status: DC | PRN

## 2016-09-08 NOTE — Patient Instructions (Signed)
     IF you received an x-ray today, you will receive an invoice from [facility]. Please contact [facility] at [phone] with questions or concerns regarding your invoice.   IF you received labwork today, you will receive an invoice from [facility]. Please contact [facility] at [phone] with questions or concerns regarding your invoice.   Our billing staff will not be able to assist you with questions regarding bills from these companies.  You will be contacted with the lab results as soon as they are available. The fastest way to get your results is to activate your My Chart account. Instructions are located on the last page of this paperwork. If you have not heard from us regarding the results in 2 weeks, please contact this office.      
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
## 2016-09-10 ENCOUNTER — Other Ambulatory Visit: Payer: Self-pay | Admitting: Family Medicine

## 2016-09-10 DIAGNOSIS — Z1231 Encounter for screening mammogram for malignant neoplasm of breast: Secondary | ICD-10-CM

## 2016-10-05 ENCOUNTER — Other Ambulatory Visit: Payer: Self-pay | Admitting: Family Medicine

## 2016-10-06 ENCOUNTER — Ambulatory Visit: Payer: BLUE CROSS/BLUE SHIELD | Admitting: Dietician

## 2016-10-06 NOTE — Telephone Encounter (Signed)
Last office visit 09/2016 Added to med list 06/2016

## 2016-10-18 ENCOUNTER — Encounter: Payer: BLUE CROSS/BLUE SHIELD | Attending: Family Medicine | Admitting: Dietician

## 2016-10-18 ENCOUNTER — Ambulatory Visit
Admission: RE | Admit: 2016-10-18 | Discharge: 2016-10-18 | Disposition: A | Discharge status: Home / Self Care | Payer: BLUE CROSS/BLUE SHIELD | Source: Ambulatory Visit | Attending: Family Medicine | Admitting: Family Medicine

## 2016-10-18 DIAGNOSIS — Z1231 Encounter for screening mammogram for malignant neoplasm of breast: Secondary | ICD-10-CM

## 2016-10-18 DIAGNOSIS — Z713 Dietary counseling and surveillance: Secondary | ICD-10-CM | POA: Diagnosis not present

## 2016-10-18 DIAGNOSIS — Z794 Long term (current) use of insulin: Secondary | ICD-10-CM | POA: Diagnosis not present

## 2016-10-18 DIAGNOSIS — E11319 Type 2 diabetes mellitus with unspecified diabetic retinopathy without macular edema: Secondary | ICD-10-CM | POA: Diagnosis not present

## 2016-10-18 DIAGNOSIS — E1310 Other specified diabetes mellitus with ketoacidosis without coma: Secondary | ICD-10-CM

## 2016-10-18 NOTE — Progress Notes (Signed)
  Medical Nutrition Therapy:  Appt start time: 1140 end time:  1220   Assessment:  Primary concerns today: Angel French is here today since she wants to get back on track with diabetes. Returns with an 8.2 lb weight loss in the past month. Gave up meat and a lot of daily since 09/29/2016. Has found that it has been very easy.   Went on a cruise over Thanksgiving and got sick after the trip. Has not been to the chiropractor in the past 2 week and will go back after Christmas. Still having a lot of pain even with Vicodin. Does not want to increase dosage. Felt like the injection did not last long (about a week).  Blood sugar is between 120-148 mg/dl. Last Hgb A1c was 9.1% (down from 9.4%). Going to counselor at [employer].   Bought a bunch of frozen vegetables from company. Having difficulty with dinner meals.   Would like to get weight below 200 lbs.   Preferred Learning Style:   No preference indicated   Learning Readiness:   Ready  MEDICATIONS: insulin only   DIETARY INTAKE:  Usual eating pattern includes 2 meals and 0-2 snacks per day.  Avoided foods include: cauliflower, eggplant, ham, shrimp   24-hr recall:  B ( AM): 50/50 orange juice and 2 eggs and sometimes bread  Snk ( AM): none usually or piece of fruit   L (PM): none or coffee or pack of crackers (rare) or pita with vegetables sometimes with cheese  Snk ( PM): none or apple  D (5-9PM): vegetables sometimes with rice Snk (10 PM): none or candy  Beverages: coffee, hot tea with splenda, 50/50 orange juice  Usual physical activity: none recently - has no energy  Estimated energy needs: 1400 calories 158 g carbohydrates 105 g protein 39 g fat  Progress Towards Goal(s):  In progress.   Nutritional Diagnosis:  [place]-2.1 Inpaired nutrition utilization As related to hx of diabetes.  As evidenced by Hgb A1c of 9.6%.    Intervention:  Nutrition counseling provided. Goals:  Try to use your exercise bands again when  you have more energy (after you can go to the chiropractor)  Try EAS shake or other vegan protein shakes  Try Ripple Milk (made from pea protein)  Look for vegetarian source of protein to have with most meals (eggs, beans, tofu, tempeh, veggie "meats", nuts, nut butter)  Try magical loaf recipe studio for recipes http://www.veganlunchbox.com/loaf_studio.html  If you go to [employer], try [employer] Method Utilized:  Visual Auditory Hands on  Handouts given during visit include:  Therapists in the area  Barriers to learning/adherence to lifestyle change: stress, can't drive, limited mobility  Demonstrated degree of understanding via:  Teach Back   Monitoring/Evaluation:  Dietary intake, exercise, and body weight in 2 month(s).

## 2016-10-18 NOTE — Patient Instructions (Addendum)
Goals:  Try to use your exercise bands again when you have more energy (after you can go to the chiropractor)  Try EAS shake or other vegan protein shakes  Try Ripple Milk (made from pea protein)  Look for vegetarian source of protein to have with most meals (eggs, beans, tofu, tempeh, veggie "meats", nuts, nut butter)  Try magical loaf recipe studio for recipes http://www.veganlunchbox.com/loaf_studio.html  If you go to [place], try [employer]

## 2016-11-15 ENCOUNTER — Other Ambulatory Visit: Payer: Self-pay | Admitting: Family Medicine

## 2016-11-15 ENCOUNTER — Telehealth: Payer: Self-pay

## 2016-11-15 NOTE — Telephone Encounter (Signed)
SMITH - Pt dropped off forms for you to fill out.  I have left this in your box.  [id number]

## 2016-11-15 NOTE — Telephone Encounter (Signed)
fyi

## 2016-11-25 ENCOUNTER — Other Ambulatory Visit: Payer: Self-pay | Admitting: Family Medicine

## 2016-11-25 NOTE — Telephone Encounter (Signed)
Done- pt has an appt before these medications will run out.

## 2016-11-27 ENCOUNTER — Ambulatory Visit (INDEPENDENT_AMBULATORY_CARE_PROVIDER_SITE_OTHER): Payer: Self-pay | Admitting: Family Medicine

## 2016-11-27 VITALS — BP 122/72 | HR 107 | Temp 98.9°F | Resp 18 | Ht 62.0 in | Wt 209.8 lb

## 2016-11-27 DIAGNOSIS — M5136 Other intervertebral disc degeneration, lumbar region: Secondary | ICD-10-CM

## 2016-11-27 DIAGNOSIS — M79672 Pain in left foot: Secondary | ICD-10-CM

## 2016-11-27 DIAGNOSIS — M5432 Sciatica, left side: Secondary | ICD-10-CM

## 2016-11-27 NOTE — Patient Instructions (Signed)
     IF you received an x-ray today, you will receive an invoice from [facility]. Please contact [facility] at [phone] with questions or concerns regarding your invoice.   IF you received labwork today, you will receive an invoice from [facility]. Please contact [facility] at [phone] with questions or concerns regarding your invoice.   Our billing staff will not be able to assist you with questions regarding bills from these companies.  You will be contacted with the lab results as soon as they are available. The fastest way to get your results is to activate your My Chart account. Instructions are located on the last page of this paperwork. If you have not heard from us regarding the results in 2 weeks, please contact this office.     

## 2016-11-27 NOTE — Progress Notes (Signed)
Subjective:    Patient ID: Angel French, female    DOB: 08-02-1951, 66 y.o.   MRN: [id number]  11/27/2016  Lab Work Dietitian says pt needs to be tested for gout, have lt foot x-ray, venous doppler on left leg) and Discuss Medication (Wants to know if she can take turmeric)   HPI  Has seen two spine specialists; no one wants to surgically repair lumbar DDD.  Pain is severe from L posterior leg to L foot.  With chiropractic care, gets relief during the session yet pain returns.  Chiropactor is recommending testing for gout, LE doppler.  S/p therapy, three injections in back without improvement in foot pain; sciatica has improved.  Has stenosis and that is why medication does not reach the foot.  Going nuts.  True pain that cannot explain.  Sleeps a lot to avoid th pain; no pain with sleep.  Spends 90% of day in bed.  Pain is horrible.  Husband works on foot with temporary relief.  At end of the rope on it.  Cannot walk; forces self to walk.  Foot is turning out when walking.  Recommending therapy on foot while seeing chiropractor.    No previous podiatrist.   First toe has intense pain at base of nailbed to MTP.   Wt Readings from Last 3 Encounters:  11/27/16 209 lb 12.8 oz (95.2 kg)  10/18/16 218 lb 12.8 oz (99.2 kg)  09/08/16 228 lb (103.4 kg)    Review of Systems  Constitutional: Negative for chills, diaphoresis, fatigue and fever.  Eyes: Negative for visual disturbance.  Respiratory: Negative for cough and shortness of breath.   Cardiovascular: Negative for chest pain, palpitations and leg swelling.  Gastrointestinal: Negative for abdominal pain, constipation, diarrhea, nausea and vomiting.  Endocrine: Negative for cold intolerance, heat intolerance, polydipsia, polyphagia and polyuria.  Musculoskeletal: Positive for arthralgias, back pain, gait problem and joint swelling.  Neurological: Negative for dizziness, tremors, seizures, syncope, facial asymmetry, speech  difficulty, weakness, light-headedness, numbness and headaches.    Past Medical History:  Diagnosis Date  . Allergy    generic allergy pill; Spring and Fall only.  . Anxiety   . Arthritis    DDD lumbar, R hip OA.  s/p ortho consult in past.  . Blood transfusion without reported diagnosis    Mountain climbing accident in [place].  . Brachial plexus disorders   . Cataract    B retractions.  . Chronic kidney disease    stage 3 per pt.   . Depression   . Diabetes mellitus   . Diabetic peripheral neuropathy associated with type 2 diabetes mellitus ([place])   . Diabetic retinopathy ([place])   . Diabetic retinopathy associated with type 2 diabetes mellitus ([place])    s/p laser treatment multiple.  Unable to drive.  Marland Kitchen GERD (gastroesophageal reflux disease)   . Hyperlipidemia   . Hypertension    controlled, off meds   . Neuromuscular disorder ([place])   . Rheumatoid arthritis ([place])   . Ulcer ([place])    Peptic ulcer H. Pylori + s/p treatment.  Upper GI diagnosed.[name] Prilosec PRN .   Past Surgical History:  Procedure Laterality Date  .  2 SPINAL INJECTIONS     . ABDOMINAL HYSTERECTOMY  11/02/1979   DUB; cervical dysplasia; ovaries intact.  . ABDOMINAL SURGERY     staph abcess   . [facility] Admission     age 44; three months in [place].  Marland Kitchen CARDIAC CATHETERIZATION  11/02/2007   normal coronary arteries.  . CARPAL TUNNEL RELEASE     Bilateral.  . CATARACT EXTRACTION, BILATERAL    . CHOLECYSTECTOMY    . EYE SURGERY     Cataracts B. Laser surgery x 7 for Diabetic Retinopathy  . TONSILLECTOMY     Allergies  Allergen Reactions  . Codeine Anaphylaxis  . Contrast Media [Iodinated Diagnostic Agents] Anaphylaxis  . Nitrofurantoin Monohyd Macro Anaphylaxis  . Betadine [Povidone Iodine] Itching  . Folic Acid Itching  . Gabapentin Other (See Comments)    Makes patient feel drunk  . Iodine Hives  . Lyrica [Pregabalin] Other (See Comments)    Makes patient feel drunk  . Red Dye  Itching  . Ultram [Tramadol Hcl] Nausea And Vomiting    Social History   Social History  . Marital status: Married    Spouse name: Marijean Niemann  . Number of children: 2  . Years of education: college   Occupational History  . retired     retretied   Social History Main Topics  . Smoking status: Former Research scientist (life sciences)  . Smokeless tobacco: Never Used     Comment: Quit 1987  . Alcohol use No  . Drug use: No  . Sexual activity: Yes    Birth control/ protection: Surgical, Post-menopausal     Comment: widow   Other Topics Concern  . Not on file   Social History Narrative   Marital status: widowed since 2009; dating x 6 years.  Happy; no abuse.      Children: 2 children (43 daughter, 11 son estranged); 2 grandchildren.      Lives: with boyfriend, daughter, granddaughter, friend of daughter.  Lives in pt house.      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  Diabetic retinopathy; unable to drive.      Tobacco:  Smoked x 20 years; quit 20 years.      Alcohol:  Never.      Drugs:  None since college.      Exercise:  Walking several times per week; walks the dog.   Education college   Caffeine one cup daily.   Right handed            Family History  Problem Relation Age of Onset  . Adopted: Yes  . Family history unknown: Yes       Objective:    BP 122/72   Pulse (!) 107   Temp 98.9 F (37.2 C) (Oral)   Resp 18   Ht 5\' 2"  (1.575 m)   Wt 209 lb 12.8 oz (95.2 kg)   SpO2 94%   BMI 38.37 kg/m  Physical Exam  Constitutional: She is oriented to person, place, and time. She appears well-developed and well-nourished. No distress.  HENT:  Head: Normocephalic and atraumatic.  Right Ear: External ear normal.  Left Ear: External ear normal.  Nose: Nose normal.  Mouth/Throat: Oropharynx is French and moist.  Eyes: Conjunctivae and EOM are normal. Pupils are equal, round, and reactive to light.  Neck: Normal range of motion. Neck supple. Carotid bruit is not  present. No thyromegaly present.  Cardiovascular: Normal rate, regular rhythm, normal heart sounds and intact distal pulses.  Exam reveals no gallop and no friction rub.   No murmur heard. Pulmonary/Chest: Effort normal and breath sounds normal. She has no wheezes. She has no rales.  Lymphadenopathy:    She has no cervical adenopathy.  Neurological: She is alert and oriented to person,  place, and time. No cranial nerve deficit.  Skin: Skin is warm and dry. No rash noted. She is not diaphoretic. No erythema. No pallor.  Psychiatric: She has a normal mood and affect. Her behavior is normal.        Assessment & Plan:   1. Degenerative disc disease, lumbar   2. Left sided sciatica   3. Left foot pain    -SCAD formed completed during visit. -agreeable to order uric acid level, LE doppler L leg, and xray L foot at visit in three weeks. -highly recommend podiatry consultation as well when obtains new insurance policy. -pain atypical for sciatica pain.  No orders of the defined types were placed in this encounter.  No orders of the defined types were placed in this encounter.   No Follow-up on file.   Illyanna Petillo Elayne Guerin, M.D. Primary Care at [facility] previously [facility] [address] [place], [place]  [address] [phone] phone [phone] fax

## 2016-11-28 ENCOUNTER — Other Ambulatory Visit: Payer: Self-pay | Admitting: Family Medicine

## 2016-11-29 NOTE — Telephone Encounter (Signed)
Form completed and provided to pt on 11/27/16.

## 2016-11-29 NOTE — Telephone Encounter (Signed)
Has OV scheduled for 12/14/2016 with Dr. Tamala Julian.  Meds ordered this encounter  Medications  . oxybutynin (DITROPAN-XL) 10 MG 24 hr tablet    Sig: TAKE 1 TABLET BY MOUTH AT BEDTIME    Dispense:  90 tablet    Refill:  0   Patient notified via My Chart.

## 2016-12-06 ENCOUNTER — Telehealth: Payer: Self-pay

## 2016-12-06 DIAGNOSIS — M79672 Pain in left foot: Secondary | ICD-10-CM

## 2016-12-06 DIAGNOSIS — Z79899 Other long term (current) drug therapy: Secondary | ICD-10-CM | POA: Diagnosis not present

## 2016-12-06 DIAGNOSIS — M0609 Rheumatoid arthritis without rheumatoid factor, multiple sites: Secondary | ICD-10-CM | POA: Diagnosis not present

## 2016-12-06 NOTE — Telephone Encounter (Signed)
Pt stopped in to state that she was to have a doppler and a podiatrist Referral - before her next follow up with Dr Lafe Garin phone for pt is [phone]

## 2016-12-07 NOTE — Telephone Encounter (Signed)
Podiatrist for l painful foot  R/t to her sciatica Doppler for same reason, said  She talked with dr Tamala Julian 1 week ago about this    Medicare  And supplement is new insurance

## 2016-12-09 ENCOUNTER — Encounter: Payer: Self-pay | Admitting: Skilled Nursing Facility1

## 2016-12-09 ENCOUNTER — Encounter: Payer: Medicare Other | Attending: Family Medicine | Admitting: Skilled Nursing Facility1

## 2016-12-09 DIAGNOSIS — E119 Type 2 diabetes mellitus without complications: Secondary | ICD-10-CM

## 2016-12-09 DIAGNOSIS — Z713 Dietary counseling and surveillance: Secondary | ICD-10-CM | POA: Insufficient documentation

## 2016-12-09 DIAGNOSIS — Z794 Long term (current) use of insulin: Secondary | ICD-10-CM | POA: Insufficient documentation

## 2016-12-09 DIAGNOSIS — E11319 Type 2 diabetes mellitus with unspecified diabetic retinopathy without macular edema: Secondary | ICD-10-CM | POA: Insufficient documentation

## 2016-12-09 NOTE — Patient Instructions (Addendum)
-  Eat some beans  -Try edememe in your soup  -Try soy milk any flavor any brand  -Try your black bean burgers  -Make a smoothie with your protein powder: fruit, spinach, soy milk, protein powder

## 2016-12-09 NOTE — Telephone Encounter (Signed)
Referral to podiatry and for lower extremity doppler placed.  Please advise pt.

## 2016-12-09 NOTE — Progress Notes (Signed)
  Medical Nutrition Therapy:  Appt start time: 1140 end time:  1220   Assessment:  Primary concerns today:  Would like to get weight below 200 lbs.  Pt returns with a 10 pound wt loss. Pt states she is in a lot of pain. Pt states she has now become vegetarian. Pt states she feels like she is eating more fat than when she eat meat. Pt states he has been feeling very tired so she knows she is not getting enough protein. Pt states she has developed a fetish for foods. Pt states her sleep has been lousy due to pain. Pt states her foot is a disaster. Pt states nothing tastes good anymore.   Preferred Learning Style:   No preference indicated   Learning Readiness:   Ready  MEDICATIONS: insulin only   DIETARY INTAKE:  Usual eating pattern includes 2 meals and 0-2 snacks per day.  Avoided foods include: cauliflower, eggplant, ham, shrimp, meats  24-hr recall:  B ( AM): premier protein shake---toast---eggs Snk ( AM): none usually or piece of fruit   L (PM):half bagel----fruit---vegetables Snk ( PM): none or apple  D (5-9PM): none Snk (10 PM): none or candy  Beverages: coffee, hot tea with splenda, 50/50 orange juice  Usual physical activity: none recently - has no energy  Estimated energy needs: 1400 calories 158 g carbohydrates 105 g protein 39 g fat  Progress Towards Goal(s):  In progress.   Nutritional Diagnosis:  [place]-2.1 Inpaired nutrition utilization As related to hx of diabetes.  As evidenced by Hgb A1c of 9.6%.    Intervention:  Nutrition counseling provided. Goals: -Eat some beans -Try edememe in your soup -Try soy milk any flavor any brand -Try your black bean burgers -Make a smoothie with your protein powder: fruit, spinach, soy milk, protein powder -Talk to your therapist about your lack of wanting to eat  Teaching Method Utilized:  Visual Auditory Hands on  Handouts given during visit include:  Therapists in the area  Barriers to learning/adherence to  lifestyle change: stress, can't drive, limited mobility  Demonstrated degree of understanding via:  Teach Back   Monitoring/Evaluation:  Dietary intake, exercise, and body weight in 2 month(s).

## 2016-12-10 ENCOUNTER — Other Ambulatory Visit: Payer: Self-pay | Admitting: Emergency Medicine

## 2016-12-10 DIAGNOSIS — M79672 Pain in left foot: Secondary | ICD-10-CM

## 2016-12-10 NOTE — Telephone Encounter (Signed)
Podiatry sent to [facility], doppler order sent to [facility]

## 2016-12-13 DIAGNOSIS — E669 Obesity, unspecified: Secondary | ICD-10-CM | POA: Diagnosis not present

## 2016-12-13 DIAGNOSIS — R682 Dry mouth, unspecified: Secondary | ICD-10-CM | POA: Diagnosis not present

## 2016-12-13 DIAGNOSIS — M0609 Rheumatoid arthritis without rheumatoid factor, multiple sites: Secondary | ICD-10-CM | POA: Diagnosis not present

## 2016-12-13 DIAGNOSIS — Z79899 Other long term (current) drug therapy: Secondary | ICD-10-CM | POA: Diagnosis not present

## 2016-12-13 DIAGNOSIS — Z6837 Body mass index (BMI) 37.0-37.9, adult: Secondary | ICD-10-CM | POA: Diagnosis not present

## 2016-12-13 DIAGNOSIS — M791 Myalgia: Secondary | ICD-10-CM | POA: Diagnosis not present

## 2016-12-13 DIAGNOSIS — M5136 Other intervertebral disc degeneration, lumbar region: Secondary | ICD-10-CM | POA: Diagnosis not present

## 2016-12-13 DIAGNOSIS — M2550 Pain in unspecified joint: Secondary | ICD-10-CM | POA: Diagnosis not present

## 2016-12-14 ENCOUNTER — Ambulatory Visit (INDEPENDENT_AMBULATORY_CARE_PROVIDER_SITE_OTHER): Payer: Medicare Other | Admitting: Family Medicine

## 2016-12-14 ENCOUNTER — Encounter: Payer: Self-pay | Admitting: Family Medicine

## 2016-12-14 VITALS — BP 133/68 | HR 75 | Temp 98.0°F | Resp 16 | Ht 62.0 in | Wt 210.0 lb

## 2016-12-14 DIAGNOSIS — E1142 Type 2 diabetes mellitus with diabetic polyneuropathy: Secondary | ICD-10-CM | POA: Diagnosis not present

## 2016-12-14 DIAGNOSIS — N183 Chronic kidney disease, stage 3 unspecified: Secondary | ICD-10-CM

## 2016-12-14 DIAGNOSIS — M79672 Pain in left foot: Secondary | ICD-10-CM

## 2016-12-14 DIAGNOSIS — I10 Essential (primary) hypertension: Secondary | ICD-10-CM

## 2016-12-14 DIAGNOSIS — M05741 Rheumatoid arthritis with rheumatoid factor of right hand without organ or systems involvement: Secondary | ICD-10-CM | POA: Diagnosis not present

## 2016-12-14 DIAGNOSIS — N2889 Other specified disorders of kidney and ureter: Secondary | ICD-10-CM

## 2016-12-14 DIAGNOSIS — E113593 Type 2 diabetes mellitus with proliferative diabetic retinopathy without macular edema, bilateral: Secondary | ICD-10-CM | POA: Diagnosis not present

## 2016-12-14 DIAGNOSIS — F329 Major depressive disorder, single episode, unspecified: Secondary | ICD-10-CM

## 2016-12-14 DIAGNOSIS — M51369 Other intervertebral disc degeneration, lumbar region without mention of lumbar back pain or lower extremity pain: Secondary | ICD-10-CM

## 2016-12-14 DIAGNOSIS — G894 Chronic pain syndrome: Secondary | ICD-10-CM

## 2016-12-14 DIAGNOSIS — M05742 Rheumatoid arthritis with rheumatoid factor of left hand without organ or systems involvement: Secondary | ICD-10-CM

## 2016-12-14 DIAGNOSIS — I878 Other specified disorders of veins: Secondary | ICD-10-CM | POA: Diagnosis not present

## 2016-12-14 DIAGNOSIS — M7989 Other specified soft tissue disorders: Secondary | ICD-10-CM | POA: Diagnosis not present

## 2016-12-14 DIAGNOSIS — E7800 Pure hypercholesterolemia, unspecified: Secondary | ICD-10-CM

## 2016-12-14 DIAGNOSIS — M5136 Other intervertebral disc degeneration, lumbar region: Secondary | ICD-10-CM

## 2016-12-14 LAB — GLUCOSE, POCT (MANUAL RESULT ENTRY): POC Glucose: 117 mg/dl — AB (ref 70–99)

## 2016-12-14 LAB — POCT GLYCOSYLATED HEMOGLOBIN (HGB A1C): Hemoglobin A1C: 8.4

## 2016-12-14 MED ORDER — HYDROCODONE-ACETAMINOPHEN 10-325 MG PO TABS: 1.0000 | ORAL_TABLET | Freq: Four times a day (QID) | ORAL | 0 refills | Status: DC | PRN

## 2016-12-14 MED ORDER — OXYCODONE-ACETAMINOPHEN 10-325 MG PO TABS: 1.0000 | ORAL_TABLET | Freq: Every evening | ORAL | 0 refills | Status: DC | PRN

## 2016-12-14 MED ORDER — DULOXETINE HCL 20 MG PO CPEP: 20.0000 mg | ORAL_CAPSULE | Freq: Every day | ORAL | 1 refills | Status: DC

## 2016-12-14 NOTE — Patient Instructions (Addendum)
  INCREASE LEVEMIR TO 100UNITS DAILY.   IF you received an x-ray today, you will receive an invoice from [facility]. Please contact [facility] at [phone] with questions or concerns regarding your invoice.   IF you received labwork today, you will receive an invoice from [facility]. Please contact [facility] at [phone] with questions or concerns regarding your invoice.   Our billing staff will not be able to assist you with questions regarding bills from these companies.  You will be contacted with the lab results as soon as they are available. The fastest way to get your results is to activate your My Chart account. Instructions are located on the last page of this paperwork. If you have not heard from [name] regarding the results in 2 weeks, please contact this office.

## 2016-12-14 NOTE — Progress Notes (Signed)
Subjective:    Patient ID: Angel French, female    DOB: Aug 07, 1951, 66 y.o.   MRN: [id number]  12/14/2016  Follow-up (3 MONTHS)   HPI This 66 y.o. female presents for evaluation of DMII, hypertension, anxiety, rheumatoid arthritis, foot pain.  Patient reports good compliance with medication, good tolerance to medication, and good symptom control.    Diagnosed with fibromyalgia by rheumatology.  Cannot tolerate Lyrica or Neurontin.  Recommending head accupuncture.  Overwhelmed.  Recommended replacing Lexapro with Cymbalta.  Requesting more Lexapro; last month has been horrible. Pt very reluctant to wean off of Lexapro because has worked very well for patient until recent foot pain acutely worsened.  Seeing foot doctor 12/21/16; scheduled for lower extremity doppler 12/22/16.  Uric acid level to be obtained today.  Jury's duty summons; but now wants to go.     Immunization History  Administered Date(s) Administered  . Hepatitis A, Adult 10/06/2015, 08/12/2016  . Influenza,inj,Quad PF,36+ Mos 07/16/2013, 07/10/2014, 10/15/2015, 08/12/2016  . Pneumococcal Polysaccharide-23 07/10/2014  . Tdap 10/06/2015   BP Readings from Last 3 Encounters:  12/14/16 133/68  11/27/16 122/72  09/08/16 118/60   Wt Readings from Last 3 Encounters:  12/14/16 210 lb (95.3 kg)  12/09/16 208 lb 3.2 oz (94.4 kg)  11/27/16 209 lb 12.8 oz (95.2 kg)    Review of Systems  Constitutional: Negative for chills, diaphoresis, fatigue and fever.  Eyes: Negative for visual disturbance.  Respiratory: Negative for cough and shortness of breath.   Cardiovascular: Negative for chest pain, palpitations and leg swelling.  Gastrointestinal: Negative for abdominal pain, constipation, diarrhea, nausea and vomiting.  Endocrine: Negative for cold intolerance, heat intolerance, polydipsia, polyphagia and polyuria.  Musculoskeletal: Positive for arthralgias, back pain, gait problem, joint swelling and myalgias.    Neurological: Positive for weakness and numbness. Negative for dizziness, tremors, seizures, syncope, facial asymmetry, speech difficulty, light-headedness and headaches.  Psychiatric/Behavioral: Positive for dysphoric mood and sleep disturbance. The patient is nervous/anxious.     Past Medical History:  Diagnosis Date  . Allergy    generic allergy pill; Spring and Fall only.  . Anxiety   . Arthritis    DDD lumbar, R hip OA.  s/p ortho consult in past.  . Blood transfusion without reported diagnosis    Mountain climbing accident in [place].  . Brachial plexus disorders   . Cataract    B retractions.  . Chronic kidney disease    stage 3 per pt.   . Depression   . Diabetes mellitus   . Diabetic peripheral neuropathy associated with type 2 diabetes mellitus ([place])   . Diabetic retinopathy ([place])   . Diabetic retinopathy associated with type 2 diabetes mellitus ([place])    s/p laser treatment multiple.  Unable to drive.  Marland Kitchen GERD (gastroesophageal reflux disease)   . Hyperlipidemia   . Hypertension    controlled, off meds   . Neuromuscular disorder ([place])   . Rheumatoid arthritis ([place] )   . Ulcer ([place])    Peptic ulcer H. Pylori + s/p treatment.  Upper GI diagnosed.[name] Prilosec PRN .   Past Surgical History:  Procedure Laterality Date  .  2 SPINAL INJECTIONS     . ABDOMINAL HYSTERECTOMY  11/02/1979   DUB; cervical dysplasia; ovaries intact.  . ABDOMINAL SURGERY     staph abcess   . [facility] Admission     age 12; three months in [place].  Marland Kitchen CARDIAC CATHETERIZATION  11/02/2007   normal coronary arteries.  Marland Kitchen  CARPAL TUNNEL RELEASE     Bilateral.  . CATARACT EXTRACTION, BILATERAL    . CHOLECYSTECTOMY    . EYE SURGERY     Cataracts B. Laser surgery x 7 for Diabetic Retinopathy  . TONSILLECTOMY     Allergies  Allergen Reactions  . Codeine Anaphylaxis  . Contrast Media [Iodinated Diagnostic Agents] Anaphylaxis  . Nitrofurantoin Monohyd Macro Anaphylaxis  . Betadine  [Povidone Iodine] Itching  . Folic Acid Itching  . Gabapentin Other (See Comments)    Makes patient feel drunk  . Iodine Hives  . Lyrica [Pregabalin] Other (See Comments)    Makes patient feel drunk  . Red Dye Itching  . Ultram [Tramadol Hcl] Nausea And Vomiting    Social History   Social History  . Marital status: Married    Spouse name: Marijean Niemann  . Number of children: 2  . Years of education: college   Occupational History  . retired     retretied   Social History Main Topics  . Smoking status: Former Research scientist (life sciences)  . Smokeless tobacco: Never Used     Comment: Quit 1987  . Alcohol use No  . Drug use: No  . Sexual activity: Yes    Birth control/ protection: Surgical, Post-menopausal     Comment: widow   Other Topics Concern  . Not on file   Social History Narrative   Marital status: widowed since 2009; dating x 6 years.  Happy; no abuse.      Children: 2 children (60 daughter, 35 son estranged); 2 grandchildren.      Lives: with boyfriend, daughter, granddaughter, friend of daughter.  Lives in pt house.      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  Diabetic retinopathy; unable to drive.      Tobacco:  Smoked x 20 years; quit 20 years.      Alcohol:  Never.      Drugs:  None since college.      Exercise:  Walking several times per week; walks the dog.   Education college   Caffeine one cup daily.   Right handed            Family History  Problem Relation Age of Onset  . Adopted: Yes  . Family history unknown: Yes       Objective:    BP 133/68   Pulse 75   Temp 98 F (36.7 C) (Oral)   Resp 16   Ht 5\' 2"  (1.575 m)   Wt 210 lb (95.3 kg)   SpO2 98%   BMI 38.41 kg/m  Physical Exam  Constitutional: She is oriented to person, place, and time. She appears well-developed and well-nourished. No distress.  HENT:  Head: Normocephalic and atraumatic.  Right Ear: External ear normal.  Left Ear: External ear normal.  Nose: Nose normal.    Mouth/Throat: Oropharynx is clear and moist.  Eyes: Conjunctivae and EOM are normal. Pupils are equal, round, and reactive to light.  Neck: Normal range of motion. Neck supple. Carotid bruit is not present. No thyromegaly present.  Cardiovascular: Normal rate, regular rhythm, normal heart sounds and intact distal pulses.  Exam reveals no gallop and no friction rub.   No murmur heard. Pulmonary/Chest: Effort normal and breath sounds normal. She has no wheezes. She has no rales.  Abdominal: Soft. Bowel sounds are normal. She exhibits no distension and no mass. There is no tenderness. There is no rebound and no guarding.  Musculoskeletal:  Left ankle: Normal.       Left foot: There is decreased range of motion, tenderness and bony tenderness. There is normal capillary refill, no crepitus, no deformity and no laceration.  L distal foot extremely TTP; no erythema, warmth, streaking.  No swelling.  Decreased flexion of 2nd through fifth toes. Normal ROM first toe on L.  Lymphadenopathy:    She has no cervical adenopathy.  Neurological: She is alert and oriented to person, place, and time. No cranial nerve deficit.  Skin: Skin is warm and dry. No rash noted. She is not diaphoretic. No erythema. No pallor.  Psychiatric: She has a normal mood and affect. Her behavior is normal.   Results for orders placed or performed in visit on 12/14/16  POCT glucose (manual entry)  Result Value Ref Range   POC Glucose 117 (A) 70 - 99 mg/dl  POCT glycosylated hemoglobin (Hb A1C)  Result Value Ref Range   Hemoglobin A1C 8.4    Depression screen [facility] 2/9 01/05/2017 12/14/2016 12/14/2016 11/27/2016 10/18/2016  Decreased Interest 0 0 0 0 0  Down, Depressed, Hopeless 0 0 0 0 0  PHQ - 2 Score 0 0 0 0 0   Fall Risk  01/05/2017 12/14/2016 12/14/2016 11/27/2016 10/18/2016  Falls in the past year? Yes Yes No Yes No  Number falls in past yr: 2 or more 1 - 2 or more -  Injury with Fall? No No - Yes -  Risk for fall due to  : - - - - -       Assessment & Plan:   1. Diabetic peripheral neuropathy associated with type 2 diabetes mellitus ([place])   2. 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 Essential hypertension, benign   4. Degenerative disc disease, lumbar   5. Rheumatoid arthritis involving both hands with positive rheumatoid factor (HCC)   6. Chronic renal impairment, stage 3 (moderate)   7. Pure hypercholesterolemia   8. Chronic pain syndrome   9. Venous stasis   10. Reactive depression   11. Leg swelling    -suffering with worsening L foot pain; has known L sciatica and diabetic peripheral neuropathy yet current exam not consistent with either.  Highly agree with upcomign podiatry appointment; concern for other structural foot pathology.  Obtain uric acid level; scheduled to undergo venous doppler in upcoming week. Will undergo foot xray with podiatry.  Not sleeping due to pain; thus, add oxycodone 1 qhs. -for chronic DDD lumbar, continue hydrocodone 10/325 tid during the day; oxycodone qhs for foot pain primarily but will also address chronic loewr back pain.  -add Cymbalta 20mg  daily for fibromyalgia, diabetic peripheral neuropathy, and chronic pain. -sugars have worsened with acute pain and insomnia.  Monitor sugars closely; encourage improved dietary modification.  Increase Levemir to 100 units daily.   Orders Placed This Encounter  Procedures  . CBC with Differential/Platelet  . Comprehensive metabolic panel    Order Specific Question:   Has the patient fasted?    Answer:   Yes  . Lipid panel    Order Specific Question:   Has the patient fasted?    Answer:   Yes  . Uric Acid  . POCT glucose (manual entry)  . POCT glycosylated hemoglobin (Hb A1C)   Meds ordered this encounter  Medications  . predniSONE (DELTASONE) 5 MG tablet    Sig: Take 5 mg by mouth daily with breakfast.  . DULoxetine (CYMBALTA)  20 MG  capsule    Sig: Take 1 capsule (20 mg total) by mouth daily.    Dispense:  90 capsule    Refill:  1  . oxyCODONE-acetaminophen (PERCOCET) 10-325 MG tablet    Sig: Take 1 tablet by mouth at bedtime as needed for pain.    Dispense:  90 tablet    Refill:  0  . DISCONTD: HYDROcodone-acetaminophen (NORCO) 10-325 MG tablet    Sig: Take 1 tablet by mouth every 6 (six) hours as needed.    Dispense:  90 tablet    Refill:  0    Do not fill until 11-04-2016  . DISCONTD: HYDROcodone-acetaminophen (NORCO) 10-325 MG tablet    Sig: Take 1 tablet by mouth every 6 (six) hours as needed.    Dispense:  90 tablet    Refill:  0    Do not fill for 30 days after prescribed  . HYDROcodone-acetaminophen (NORCO) 10-325 MG tablet    Sig: Take 1 tablet by mouth every 6 (six) hours as needed.    Dispense:  90 tablet    Refill:  0    Do not fill for 60 days after prescribed    Return in about 2 weeks (around 12/28/2016) for recheck.   Finnbar Cedillos Elayne Guerin, M.D. Primary Care at [facility] previously [facility] [address] [place], [place]  [address] [phone] phone [phone] fax

## 2016-12-15 LAB — CBC WITH DIFFERENTIAL/PLATELET
BASOS: 1 %
Basophils Absolute: 0 10*3/uL (ref 0.0–0.2)
EOS (ABSOLUTE): 0.2 10*3/uL (ref 0.0–0.4)
EOS: 4 %
HEMATOCRIT: 35.1 % (ref 34.0–46.6)
HEMOGLOBIN: 11.9 g/dL (ref 11.1–15.9)
IMMATURE GRANS (ABS): 0 10*3/uL (ref 0.0–0.1)
Immature Granulocytes: 0 %
LYMPHS: 21 %
Lymphocytes Absolute: 1 10*3/uL (ref 0.7–3.1)
MCH: 30 pg (ref 26.6–33.0)
MCHC: 33.9 g/dL (ref 31.5–35.7)
MCV: 88 fL (ref 79–97)
MONOCYTES: 4 %
Monocytes Absolute: 0.2 10*3/uL (ref 0.1–0.9)
NEUTROS ABS: 3.3 10*3/uL (ref 1.4–7.0)
Neutrophils: 70 %
Platelets: 245 10*3/uL (ref 150–379)
RBC: 3.97 x10E6/uL (ref 3.77–5.28)
RDW: 14.2 % (ref 12.3–15.4)
WBC: 4.8 10*3/uL (ref 3.4–10.8)

## 2016-12-15 LAB — LIPID PANEL
CHOL/HDL RATIO: 5.6 ratio — AB (ref 0.0–4.4)
Cholesterol, Total: 208 mg/dL — ABNORMAL HIGH (ref 100–199)
HDL: 37 mg/dL — ABNORMAL LOW (ref >39)
LDL Calculated: 112 mg/dL — ABNORMAL HIGH (ref 0–99)
Triglycerides: 293 mg/dL — ABNORMAL HIGH (ref 0–149)
VLDL Cholesterol Cal: 59 mg/dL — ABNORMAL HIGH (ref 5–40)

## 2016-12-15 LAB — COMPREHENSIVE METABOLIC PANEL
A/G RATIO: 1.4 (ref 1.2–2.2)
ALBUMIN: 4.1 g/dL (ref 3.6–4.8)
ALT: 30 IU/L (ref 0–32)
AST: 42 IU/L — ABNORMAL HIGH (ref 0–40)
Alkaline Phosphatase: 86 IU/L (ref 39–117)
BILIRUBIN TOTAL: 0.4 mg/dL (ref 0.0–1.2)
BUN / CREAT RATIO: 18 (ref 12–28)
BUN: 22 mg/dL (ref 8–27)
CHLORIDE: 96 mmol/L (ref 96–106)
CO2: 29 mmol/L (ref 18–29)
Calcium: 9.6 mg/dL (ref 8.7–10.3)
Creatinine, Ser: 1.23 mg/dL — ABNORMAL HIGH (ref 0.57–1.00)
GFR, EST AFRICAN AMERICAN: 54 mL/min/{1.73_m2} — AB (ref >59)
GFR, EST NON AFRICAN AMERICAN: 46 mL/min/{1.73_m2} — AB (ref >59)
Globulin, Total: 2.9 g/dL (ref 1.5–4.5)
Glucose: 118 mg/dL — ABNORMAL HIGH (ref 65–99)
POTASSIUM: 3.9 mmol/L (ref 3.5–5.2)
Sodium: 142 mmol/L (ref 134–144)
Total Protein: 7 g/dL (ref 6.0–8.5)

## 2016-12-15 LAB — URIC ACID: URIC ACID: 10.4 mg/dL — AB (ref 2.5–7.1)

## 2016-12-16 ENCOUNTER — Telehealth: Payer: Self-pay | Admitting: Emergency Medicine

## 2016-12-16 NOTE — Telephone Encounter (Signed)
Dr. Tamala Julian,  [facility] pharmacy called in to verify medication order you wrote for Oxycodone and Norco.   Do you want patient to take both narcotics? If so, pharmacy will not fill both until confirmation from you.  Please f/u with pharmacy

## 2016-12-18 NOTE — Telephone Encounter (Signed)
Ok to fill per dr Tamala Julian , pharmacy advised

## 2016-12-21 ENCOUNTER — Other Ambulatory Visit: Payer: Self-pay | Admitting: *Deleted

## 2016-12-21 ENCOUNTER — Ambulatory Visit (INDEPENDENT_AMBULATORY_CARE_PROVIDER_SITE_OTHER): Payer: Medicare Other

## 2016-12-21 ENCOUNTER — Ambulatory Visit (INDEPENDENT_AMBULATORY_CARE_PROVIDER_SITE_OTHER): Payer: Medicare Other | Admitting: Podiatry

## 2016-12-21 ENCOUNTER — Encounter: Payer: Self-pay | Admitting: Podiatry

## 2016-12-21 DIAGNOSIS — M722 Plantar fascial fibromatosis: Secondary | ICD-10-CM | POA: Diagnosis not present

## 2016-12-21 DIAGNOSIS — G629 Polyneuropathy, unspecified: Secondary | ICD-10-CM

## 2016-12-21 DIAGNOSIS — E119 Type 2 diabetes mellitus without complications: Secondary | ICD-10-CM

## 2016-12-21 NOTE — Progress Notes (Signed)
   Subjective:    Patient ID: [place] French, female    DOB: 05/16/1951, 66 y.o.   MRN: [id number]  HPI she presents today with chief complaint of pain to the forefoot bilateral particularly to the plantar medial aspect of the left foot and the first metatarsophalangeal joint. She states that she had really bad sciatic issue because of compressed vertebra in her back effected her left leg and now foot is burning. She states that she takes Vicodin regularly which really doesn't even help with the foot she had an injection which helped with sciatica but the foot still hurts. They thought she had gout at one time and does demonstrate an elevated uric acid but no actual gout flares. She states that she's been a posterior for the past 6 months and doesn't understand why she can still have gout. Her last hemoglobin A1c was 8.4 or last uric acid was 10.4    Review of Systems  Musculoskeletal: Positive for back pain.  All other systems reviewed and are negative.      Objective:   Physical Exam: Vital signs are stable alert and oriented 3. Pulses are palpable. Neurologic sensorium is intact and hyper sensate particularly overlying the skin of the medial dorsal cutaneous nerve distribution of the first metatarsophalangeal joint left foot. Not on the right foot. She also demonstrates deep tendon reflexes appear to be normal muscle strength appears to be normal bilateral. Orthopedic evaluation demonstrates tenderness on range of motion of the first metatarsophalangeal joint and can hardly touch the foot. There is no erythema no edema no cellulitis drainage or odor in the area consistent with gout. She simply has pain overlying the medial dorsal changes in her asthma since toward the ankle. She also has pain on palpation medial calcaneal tubercle of the left heel. Radiographs taken today demonstrate soft tissue increase in density at the plantar fascia cannula insertion site no erosive changes of the first  metatarsophalangeal joint. Right foot appears to be relatively normal with mild thickening of the plantar fascia calcaneal insertion site. Cutaneous evaluation was a supple hyperacute is no open lesions or wounds are noted.        Assessment & Plan:  Plantar fasciitis left foot. Neuropathy probable diabetic origin and sciatic origin. 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  Plan: I injected the left heel today a posterior plantar fascia brace. Next visit I will consider a single nerve block to the medial dorsal cutaneous nerve.

## 2016-12-22 ENCOUNTER — Telehealth: Payer: Self-pay

## 2016-12-22 ENCOUNTER — Ambulatory Visit (HOSPITAL_COMMUNITY)
Admission: RE | Admit: 2016-12-22 | Discharge: 2016-12-22 | Disposition: A | Discharge status: Home / Self Care | Payer: Medicare Other | Source: Ambulatory Visit | Attending: Vascular Surgery | Admitting: Vascular Surgery

## 2016-12-22 DIAGNOSIS — M79672 Pain in left foot: Secondary | ICD-10-CM | POA: Insufficient documentation

## 2016-12-22 NOTE — Telephone Encounter (Signed)
[name] called normal study/no dvt, pt was sent home

## 2016-12-23 NOTE — Telephone Encounter (Signed)
Noted. No further action warranted at this time.  Podiatry note reviewed.

## 2017-01-04 DIAGNOSIS — H04123 Dry eye syndrome of bilateral lacrimal glands: Secondary | ICD-10-CM | POA: Diagnosis not present

## 2017-01-04 DIAGNOSIS — H40013 Open angle with borderline findings, low risk, bilateral: Secondary | ICD-10-CM | POA: Diagnosis not present

## 2017-01-04 DIAGNOSIS — H35373 Puckering of macula, bilateral: Secondary | ICD-10-CM | POA: Diagnosis not present

## 2017-01-04 DIAGNOSIS — H16223 Keratoconjunctivitis sicca, not specified as Sjogren's, bilateral: Secondary | ICD-10-CM | POA: Diagnosis not present

## 2017-01-04 DIAGNOSIS — E103293 Type 1 diabetes mellitus with mild nonproliferative diabetic retinopathy without macular edema, bilateral: Secondary | ICD-10-CM | POA: Diagnosis not present

## 2017-01-04 DIAGNOSIS — H35033 Hypertensive retinopathy, bilateral: Secondary | ICD-10-CM | POA: Diagnosis not present

## 2017-01-05 ENCOUNTER — Encounter: Payer: Self-pay | Admitting: Family Medicine

## 2017-01-05 ENCOUNTER — Ambulatory Visit (INDEPENDENT_AMBULATORY_CARE_PROVIDER_SITE_OTHER): Payer: Medicare Other | Admitting: Family Medicine

## 2017-01-05 VITALS — BP 117/70 | HR 90 | Temp 98.7°F | Resp 18 | Ht 62.0 in | Wt 214.0 lb

## 2017-01-05 DIAGNOSIS — G894 Chronic pain syndrome: Secondary | ICD-10-CM | POA: Diagnosis not present

## 2017-01-05 DIAGNOSIS — M722 Plantar fascial fibromatosis: Secondary | ICD-10-CM

## 2017-01-05 DIAGNOSIS — M5136 Other intervertebral disc degeneration, lumbar region: Secondary | ICD-10-CM

## 2017-01-05 DIAGNOSIS — E1142 Type 2 diabetes mellitus with diabetic polyneuropathy: Secondary | ICD-10-CM

## 2017-01-05 DIAGNOSIS — N183 Chronic kidney disease, stage 3 unspecified: Secondary | ICD-10-CM

## 2017-01-05 DIAGNOSIS — E790 Hyperuricemia without signs of inflammatory arthritis and tophaceous disease: Secondary | ICD-10-CM | POA: Diagnosis not present

## 2017-01-05 DIAGNOSIS — M05741 Rheumatoid arthritis with rheumatoid factor of right hand without organ or systems involvement: Secondary | ICD-10-CM | POA: Diagnosis not present

## 2017-01-05 DIAGNOSIS — M05742 Rheumatoid arthritis with rheumatoid factor of left hand without organ or systems involvement: Secondary | ICD-10-CM | POA: Diagnosis not present

## 2017-01-05 DIAGNOSIS — F329 Major depressive disorder, single episode, unspecified: Secondary | ICD-10-CM

## 2017-01-05 MED ORDER — ALLOPURINOL 100 MG PO TABS: 100.0000 mg | ORAL_TABLET | Freq: Every day | ORAL | 6 refills | Status: DC

## 2017-01-05 NOTE — Patient Instructions (Addendum)
   IF you received an x-ray today, you will receive an invoice from [facility]. Please contact [facility] at [phone] with questions or concerns regarding your invoice.   IF you received labwork today, you will receive an invoice from [facility]. Please contact [facility] at [phone] with questions or concerns regarding your invoice.   Our billing staff will not be able to assist you with questions regarding bills from these companies.  You will be contacted with the lab results as soon as they are available. The fastest way to get your results is to activate your My Chart account. Instructions are located on the last page of this paperwork. If you have not heard from us regarding the results in 2 weeks, please contact this office.    Low-Purine Diet Purines are compounds that affect the level of uric acid in your body. A low-purine diet is a diet that is low in purines. Eating a low-purine diet can prevent the level of uric acid in your body from getting too high and causing gout or kidney stones or both. What do I need to know about this diet?  Choose low-purine foods. Examples of low-purine foods are listed in the next section.  Drink plenty of fluids, especially water. Fluids can help remove uric acid from your body. Try to drink 8-16 cups (1.9-3.8 L) a day.  Limit foods high in fat, especially saturated fat, as fat makes it harder for the body to get rid of uric acid. Foods high in saturated fat include pizza, cheese, ice cream, whole milk, fried foods, and gravies. Choose foods that are lower in fat and lean sources of protein. Use olive oil when cooking as it contains healthy fats that are not high in saturated fat.  Limit alcohol. Alcohol interferes with the elimination of uric acid from your body. If you are having a gout attack, avoid all alcohol.  Keep in mind that different people's bodies react differently to different foods. You will probably learn over  time which foods do or do not affect you. If you discover that a food tends to cause your gout to flare up, avoid eating that food. You can more freely enjoy foods that do not cause problems. If you have any questions about a food item, talk to your dietitian or health care provider. Which foods are low, moderate, and high in purines? The following is a list of foods that are low, moderate, and high in purines. You can eat any amount of the foods that are low in purines. You may be able to have small amounts of foods that are moderate in purines. Ask your health care provider how much of a food moderate in purines you can have. Avoid foods high in purines. Grains  Foods low in purines: Enriched white bread, pasta, rice, cake, cornbread, popcorn.  Foods moderate in purines: Whole-grain breads and cereals, wheat germ, bran, oatmeal. Uncooked oatmeal. Dry wheat bran or wheat germ.  Foods high in purines: Pancakes, French toast, biscuits, muffins. Vegetables  Foods low in purines: All vegetables, except those that are moderate in purines.  Foods moderate in purines: Asparagus, cauliflower, spinach, mushrooms, green peas. Fruits  All fruits are low in purines. Meats and other Protein Foods  Foods low in purines: Eggs, nuts, peanut butter.  Foods moderate in purines: 80-90% lean beef, lamb, veal, pork, poultry, fish, eggs, peanut butter, nuts. Crab, lobster, oysters, and shrimp. Cooked dried beans, peas, and lentils.  Foods high in purines: Anchovies,   sardines, herring, mussels, tuna, codfish, scallops, trout, and haddock. Bacon. Organ meats (such as liver or kidney). Tripe. Game meat. Goose. Sweetbreads. Dairy  All dairy foods are low in purines. Low-fat and fat-free dairy products are best because they are low in saturated fat. Beverages  Drinks low in purines: Water, carbonated beverages, tea, coffee, cocoa.  Drinks moderate in purines: Soft drinks and other drinks sweetened with  high-fructose corn syrup. Juices. To find whether a food or drink is sweetened with high-fructose corn syrup, look at the ingredients list.  Drinks high in purines: Alcoholic beverages (such as beer). Condiments  Foods low in purines: Salt, herbs, olives, pickles, relishes, vinegar.  Foods moderate in purines: Butter, margarine, oils, mayonnaise. Fats and Oils  Foods low in purines: All types, except gravies and sauces made with meat.  Foods high in purines: Gravies and sauces made with meat. Other Foods  Foods low in purines: Sugars, sweets, gelatin. Cake. Soups made without meat.  Foods moderate in purines: Meat-based or fish-based soups, broths, or bouillons. Foods and drinks sweetened with high-fructose corn syrup.  Foods high in purines: High-fat desserts (such as ice cream, cookies, cakes, pies, doughnuts, and chocolate). Contact your dietitian for more information on foods that are not listed here. This information is not intended to replace advice given to you by your health care provider. Make sure you discuss any questions you have with your health care provider. Document Released: 02/12/2011 Document Revised: 03/25/2016 Document Reviewed: 09/24/2013 Elsevier Interactive Patient Education  2017 Elsevier Inc.  

## 2017-01-05 NOTE — Progress Notes (Signed)
Subjective:    Patient ID: [place] French, female    DOB: 1951/09/18, 66 y.o.   MRN: [id number]  01/05/2017  Follow-up (test results and GOUT )   HPI This 66 y.o. female presents for evaluation of LEFT foot pain.  Ninety percent better after heel injection for plantar fasciitis by podiatry.  Adding Cymbalta made a huge difference.  Posterior foot pain gone.  Still having front foot pain.  Taking break from psychotherapy.  Changing pan medication at night is allowing better sleep.  Changes are huge. Returning next week to Dr. Milinda Pointer.  No pain in little toe.  Cannot move toes in foot.  No longer having R foot pain.  No longer favoring R foot.  Oxycodone qhs.  Sometimes only needing two vidocin during the day.    Has researched gout; uric acid 10.4.  Has cut out meat.  Rarely eats chicken nuggets.  Cannot tolerate meat.  Cannot eat fish.  Cannot give up caffeine.  Eats a lot of nuts.  Also uses almond milk.   Became vegetarian 10/01/16.    Review of Systems  Constitutional: Negative for chills, diaphoresis, fatigue and fever.  Eyes: Negative for visual disturbance.  Respiratory: Negative for cough and shortness of breath.   Cardiovascular: Negative for chest pain, palpitations and leg swelling.  Gastrointestinal: Negative for abdominal pain, constipation, diarrhea, nausea and vomiting.  Endocrine: Negative for cold intolerance, heat intolerance, polydipsia, polyphagia and polyuria.  Musculoskeletal: Positive for arthralgias and back pain.  Neurological: Positive for numbness. Negative for dizziness, tremors, seizures, syncope, facial asymmetry, speech difficulty, weakness, light-headedness and headaches.  Psychiatric/Behavioral: Positive for dysphoric mood and sleep disturbance. The patient is not nervous/anxious.     Past Medical History:  Diagnosis Date  . Allergy    generic allergy pill; Spring and Fall only.  . Anxiety   . Arthritis    DDD lumbar, R hip OA.  s/p ortho consult in past.   . Blood transfusion without reported diagnosis    Mountain climbing accident in [place].  . Brachial plexus disorders   . Cataract    B retractions.  . Chronic kidney disease    stage 3 per pt.   . Depression   . Diabetes mellitus   . Diabetic peripheral neuropathy associated with type 2 diabetes mellitus ([place])   . Diabetic retinopathy ([place])   . Diabetic retinopathy associated with type 2 diabetes mellitus ([place])    s/p laser treatment multiple.  Unable to drive.  Marland Kitchen GERD (gastroesophageal reflux disease)   . Hyperlipidemia   . Hypertension    controlled, off meds   . Neuromuscular disorder ([place])   . Rheumatoid arthritis ([place])   . Ulcer ([place])    Peptic ulcer H. Pylori + s/p treatment.  Upper GI diagnosed.[name] Prilosec PRN .   Past Surgical History:  Procedure Laterality Date  .  2 SPINAL INJECTIONS     . ABDOMINAL HYSTERECTOMY  11/02/1979   DUB; cervical dysplasia; ovaries intact.  . ABDOMINAL SURGERY     staph abcess   . [facility] Admission     age 24; three months in [place].  Marland Kitchen CARDIAC CATHETERIZATION  11/02/2007   normal coronary arteries.  . CARPAL TUNNEL RELEASE     Bilateral.  . CATARACT EXTRACTION, BILATERAL    . CHOLECYSTECTOMY    . EYE SURGERY     Cataracts B. Laser surgery x 7 for Diabetic Retinopathy  . TONSILLECTOMY     Allergies  Allergen Reactions  . Codeine Anaphylaxis  . Contrast Media [Iodinated Diagnostic Agents] Anaphylaxis  . Nitrofurantoin Monohyd Macro Anaphylaxis  . Betadine [Povidone Iodine] Itching  . Folic Acid Itching  . Gabapentin Other (See Comments)    Makes patient feel drunk  . Iodine Hives  . Lyrica [Pregabalin] Other (See Comments)    Makes patient feel drunk  . Red Dye Itching  . Ultram [Tramadol Hcl] Nausea And Vomiting    Social History   Social History  . Marital status: Married    Spouse name: Marijean Niemann  . Number of children: 2  . Years of education: college   Occupational History  . retired      retretied   Social History Main Topics  . Smoking status: Former Research scientist (life sciences)  . Smokeless tobacco: Never Used     Comment: Quit 1987  . Alcohol use No  . Drug use: No  . Sexual activity: Yes    Birth control/ protection: Surgical, Post-menopausal     Comment: widow   Other Topics Concern  . Not on file   Social History Narrative   Marital status: widowed since 2009; dating x 6 years.  Happy; no abuse.      Children: 2 children (65 daughter, 78 son estranged); 2 grandchildren.      Lives: with boyfriend, daughter, granddaughter, friend of daughter.  Lives in pt house.      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  Diabetic retinopathy; unable to drive.      Tobacco:  Smoked x 20 years; quit 20 years.      Alcohol:  Never.      Drugs:  None since college.      Exercise:  Walking several times per week; walks the dog.   Education college   Caffeine one cup daily.   Right handed            Family History  Problem Relation Age of Onset  . Adopted: Yes  . Family history unknown: Yes       Objective:    BP 117/70 (BP Location: Right Arm, Patient Position: Sitting, Cuff Size: Small)   Pulse 90   Temp 98.7 F (37.1 C) (Oral)   Resp 18   Ht 5\' 2"  (1.575 m)   Wt 214 lb (97.1 kg)   SpO2 96%   BMI 39.14 kg/m  Physical Exam  Constitutional: She is oriented to person, place, and time. She appears well-developed and well-nourished. No distress.  HENT:  Head: Normocephalic and atraumatic.  Right Ear: External ear normal.  Left Ear: External ear normal.  Nose: Nose normal.  Mouth/Throat: Oropharynx is clear and moist.  Eyes: Conjunctivae and EOM are normal. Pupils are equal, round, and reactive to light.  Neck: Normal range of motion. Neck supple. Carotid bruit is not present. No thyromegaly present.  Cardiovascular: Normal rate, regular rhythm, normal heart sounds and intact distal pulses.  Exam reveals no gallop and no friction rub.   No murmur  heard. Pulmonary/Chest: Effort normal and breath sounds normal. She has no wheezes. She has no rales.  Abdominal: Soft. Bowel sounds are normal. She exhibits no distension and no mass. There is no tenderness. There is no rebound and no guarding.  Musculoskeletal:       Left ankle: Normal.       Left foot: There is decreased range of motion and tenderness.  Severely TTP along distal 3rd to 5th metatarsals; no swelling; decreased dorsiflexion of  LEFT foot.  Decreased ROM of toes LEFT foot.  Lymphadenopathy:    She has no cervical adenopathy.  Neurological: She is alert and oriented to person, place, and time. No cranial nerve deficit.  Skin: Skin is warm and dry. No rash noted. She is not diaphoretic. No erythema. No pallor.  Psychiatric: She has a normal mood and affect. Her behavior is normal.   Depression screen [facility] 2/9 01/05/2017 12/14/2016 12/14/2016 11/27/2016 10/18/2016  Decreased Interest 0 0 0 0 0  Down, Depressed, Hopeless 0 0 0 0 0  PHQ - 2 Score 0 0 0 0 0   Fall Risk  01/05/2017 12/14/2016 12/14/2016 11/27/2016 10/18/2016  Falls in the past year? Yes Yes No Yes No  Number falls in past yr: 2 or more 1 - 2 or more -  Injury with Fall? No No - Yes -  Risk for fall due to : - - - - -        Assessment & Plan:   1. Diabetic peripheral neuropathy associated with type 2 diabetes mellitus ([place])   2. Degenerative disc disease, lumbar   3. Rheumatoid arthritis involving both hands with positive rheumatoid factor ([place])   4. Chronic renal impairment, stage 3 (moderate)   5. Reactive depression   6. Chronic pain syndrome   7. Plantar fasciitis of left foot   8. Elevated blood uric acid level    -L foot pain much improved s/p injection to L heel by podiatry; doing much better. Scheduled to return to podiatry for nerve block of L distal foot.   -tolerating Cymbalta well; mood is much improved; pain is much improved. -sleeping well with oxycodone at bedtime. -uric acid elevated  significantly; add Allopurinol 100mg  daily yet do not feel that current foot pain due to gout.   With chronic renal insufficiency, will not be able to uptitrate allopurinol much.   No orders of the defined types were placed in this encounter.  Meds ordered this encounter  Medications  . allopurinol (ZYLOPRIM) 100 MG tablet    Sig: Take 1 tablet (100 mg total) by mouth daily.    Dispense:  30 tablet    Refill:  6    No Follow-up on file.   Elodie Panameno Elayne Guerin, M.D. Primary Care at [facility] previously [facility] [address] [place], [place]  [address] [phone] phone [phone] fax

## 2017-01-09 ENCOUNTER — Other Ambulatory Visit: Payer: Self-pay | Admitting: Physician Assistant

## 2017-01-09 ENCOUNTER — Other Ambulatory Visit: Payer: Self-pay | Admitting: Family Medicine

## 2017-01-09 NOTE — Telephone Encounter (Signed)
10/2016 last refill 

## 2017-01-11 ENCOUNTER — Ambulatory Visit (INDEPENDENT_AMBULATORY_CARE_PROVIDER_SITE_OTHER): Payer: Medicare Other | Admitting: Podiatry

## 2017-01-11 ENCOUNTER — Encounter: Payer: Self-pay | Admitting: Podiatry

## 2017-01-11 DIAGNOSIS — D3610 Benign neoplasm of peripheral nerves and autonomic nervous system, unspecified: Secondary | ICD-10-CM

## 2017-01-11 DIAGNOSIS — M722 Plantar fascial fibromatosis: Secondary | ICD-10-CM | POA: Diagnosis not present

## 2017-01-11 DIAGNOSIS — G629 Polyneuropathy, unspecified: Secondary | ICD-10-CM | POA: Diagnosis not present

## 2017-01-11 NOTE — Progress Notes (Signed)
She presents today for follow-up of her plantar fasciitis and states that it is doing much better. She still having neuropathic type symptoms to the dorsal aspect of the left foot which she contributes to rheumatoid arthritis. She is also contributing rheumatoid to the fact that she cannot utilize the plantar fascia brace but she cannot pull it tight enough to put it on.  Objective: Vital signs are stable she is alert and oriented 3 pulses are palpable. She has tenderness on palpation medial tubercle of the left heel but nowhere near as painful as it was previously. She still has tenderness on palpation of the superficial medial dorsal extensor as well as the peroneal nerve.  Assessment: Neuritis/neuropathy dorsal aspect of the left foot. Plantar fasciitis left foot.  Plan: Therapeutic nerve block dorsal aspect left foot. I injected 2 mL of Marcaine plain. I also reinjected her left heel today with Kenalog and local anesthetic she will continue all other conservative therapies. Follow up with her in 1 month. May need to consider alcohol injections.

## 2017-01-19 ENCOUNTER — Ambulatory Visit: Payer: BLUE CROSS/BLUE SHIELD | Admitting: Skilled Nursing Facility1

## 2017-01-25 DIAGNOSIS — E790 Hyperuricemia without signs of inflammatory arthritis and tophaceous disease: Secondary | ICD-10-CM | POA: Insufficient documentation

## 2017-01-25 DIAGNOSIS — M722 Plantar fascial fibromatosis: Secondary | ICD-10-CM | POA: Insufficient documentation

## 2017-01-27 ENCOUNTER — Ambulatory Visit: Payer: Medicare Other | Admitting: Skilled Nursing Facility1

## 2017-01-31 ENCOUNTER — Other Ambulatory Visit: Payer: Self-pay | Admitting: Family Medicine

## 2017-01-31 DIAGNOSIS — F32A Depression, unspecified: Secondary | ICD-10-CM

## 2017-01-31 DIAGNOSIS — F329 Major depressive disorder, single episode, unspecified: Secondary | ICD-10-CM

## 2017-02-04 DIAGNOSIS — M0609 Rheumatoid arthritis without rheumatoid factor, multiple sites: Secondary | ICD-10-CM | POA: Diagnosis not present

## 2017-02-04 DIAGNOSIS — Z79899 Other long term (current) drug therapy: Secondary | ICD-10-CM | POA: Diagnosis not present

## 2017-02-08 ENCOUNTER — Ambulatory Visit (INDEPENDENT_AMBULATORY_CARE_PROVIDER_SITE_OTHER): Payer: Medicare Other | Admitting: Podiatry

## 2017-02-08 ENCOUNTER — Encounter: Payer: Self-pay | Admitting: Podiatry

## 2017-02-08 DIAGNOSIS — D3613 Benign neoplasm of peripheral nerves and autonomic nervous system of lower limb, including hip: Secondary | ICD-10-CM

## 2017-02-08 DIAGNOSIS — M792 Neuralgia and neuritis, unspecified: Secondary | ICD-10-CM | POA: Diagnosis not present

## 2017-02-08 DIAGNOSIS — M778 Other enthesopathies, not elsewhere classified: Secondary | ICD-10-CM

## 2017-02-08 DIAGNOSIS — M779 Enthesopathy, unspecified: Secondary | ICD-10-CM

## 2017-02-08 DIAGNOSIS — M722 Plantar fascial fibromatosis: Secondary | ICD-10-CM

## 2017-02-08 DIAGNOSIS — M7752 Other enthesopathy of left foot: Secondary | ICD-10-CM

## 2017-02-08 NOTE — Progress Notes (Signed)
She presents today for follow-up of her neuropathy and plantar fasciitis left. She states that currently the third toe left foot seems to be bothering me terribly. She is also complaining about pain to the first metatarsophalangeal joint.  Objective: Vital signs are stable she is alert and oriented 3. Pulses are palpable. She has pain on range of motion with no edema cellulitis drainage or odor first metatarsophalangeal joint left foot. She also has pain on palpation with palpable Mulder's click to the third interdigital space of the left foot.  Assessment: Pain in limb secondary to Morton neuroma third digit left with neuropathy. She also has capsulitis first metatarsophalangeal joint left.  Plan: I injected the first metatarsophalangeal joint today with Kenalog and local anesthetic after sterile Betadine skin prep through a 30-gauge needle. I also injected the first dose of dehydrated alcohol to the neuroma. Follow-up with her in 3-4 weeks.

## 2017-02-15 DIAGNOSIS — M0609 Rheumatoid arthritis without rheumatoid factor, multiple sites: Secondary | ICD-10-CM | POA: Diagnosis not present

## 2017-02-15 DIAGNOSIS — M791 Myalgia: Secondary | ICD-10-CM | POA: Diagnosis not present

## 2017-02-15 DIAGNOSIS — R682 Dry mouth, unspecified: Secondary | ICD-10-CM | POA: Diagnosis not present

## 2017-02-15 DIAGNOSIS — M5136 Other intervertebral disc degeneration, lumbar region: Secondary | ICD-10-CM | POA: Diagnosis not present

## 2017-02-15 DIAGNOSIS — M109 Gout, unspecified: Secondary | ICD-10-CM | POA: Diagnosis not present

## 2017-02-15 DIAGNOSIS — Z79899 Other long term (current) drug therapy: Secondary | ICD-10-CM | POA: Diagnosis not present

## 2017-02-15 DIAGNOSIS — E669 Obesity, unspecified: Secondary | ICD-10-CM | POA: Diagnosis not present

## 2017-02-15 DIAGNOSIS — Z6838 Body mass index (BMI) 38.0-38.9, adult: Secondary | ICD-10-CM | POA: Diagnosis not present

## 2017-02-15 DIAGNOSIS — M2550 Pain in unspecified joint: Secondary | ICD-10-CM | POA: Diagnosis not present

## 2017-03-05 ENCOUNTER — Other Ambulatory Visit: Payer: Self-pay | Admitting: Physician Assistant

## 2017-03-05 NOTE — Telephone Encounter (Signed)
01/05/17 last ov

## 2017-03-09 ENCOUNTER — Other Ambulatory Visit: Payer: Self-pay | Admitting: Family Medicine

## 2017-03-10 ENCOUNTER — Ambulatory Visit (INDEPENDENT_AMBULATORY_CARE_PROVIDER_SITE_OTHER): Payer: Medicare Other | Admitting: Podiatry

## 2017-03-10 ENCOUNTER — Telehealth: Payer: Self-pay | Admitting: *Deleted

## 2017-03-10 ENCOUNTER — Encounter: Payer: Self-pay | Admitting: Podiatry

## 2017-03-10 DIAGNOSIS — M792 Neuralgia and neuritis, unspecified: Secondary | ICD-10-CM

## 2017-03-10 DIAGNOSIS — M778 Other enthesopathies, not elsewhere classified: Secondary | ICD-10-CM

## 2017-03-10 DIAGNOSIS — M779 Enthesopathy, unspecified: Secondary | ICD-10-CM

## 2017-03-10 DIAGNOSIS — D3613 Benign neoplasm of peripheral nerves and autonomic nervous system of lower limb, including hip: Secondary | ICD-10-CM

## 2017-03-10 DIAGNOSIS — M7752 Other enthesopathy of left foot: Secondary | ICD-10-CM | POA: Diagnosis not present

## 2017-03-10 NOTE — Telephone Encounter (Addendum)
-----   Message from [place] sent at 03/10/2017 11:33 AM EDT ----- Regarding: Neuro referral  Dr. Maryjean Ka - Neurosurgeon, neuropathic pain left, history of back DJD  Thanks! Referral form, clinicals, and demographics faxed to [facility].

## 2017-03-10 NOTE — Telephone Encounter (Deleted)
-----   Message from [place] sent at 03/10/2017 11:33 AM EDT ----- Regarding: Neuro referral  Dr. Maryjean Ka - Neurosurgeon, neuropathic pain left, history of back DJD  Thanks!

## 2017-03-13 NOTE — Progress Notes (Signed)
He presents today for follow-up of capsulitis and neuritis. He states that neither injection helped.  Objective: No change on physical exam.  Assessment: I feel that more than likely this is associated with his back rather than his foot.  Plan: Refer to neurosurgery for evaluation of sciatica and back.

## 2017-03-23 ENCOUNTER — Ambulatory Visit (INDEPENDENT_AMBULATORY_CARE_PROVIDER_SITE_OTHER): Payer: Medicare Other | Admitting: Family Medicine

## 2017-03-23 ENCOUNTER — Encounter: Payer: Self-pay | Admitting: Family Medicine

## 2017-03-23 VITALS — BP 133/83 | HR 84 | Temp 99.6°F | Resp 16 | Ht 62.0 in | Wt 212.8 lb

## 2017-03-23 DIAGNOSIS — M5136 Other intervertebral disc degeneration, lumbar region: Secondary | ICD-10-CM

## 2017-03-23 DIAGNOSIS — E7800 Pure hypercholesterolemia, unspecified: Secondary | ICD-10-CM

## 2017-03-23 DIAGNOSIS — Z23 Encounter for immunization: Secondary | ICD-10-CM

## 2017-03-23 DIAGNOSIS — N183 Chronic kidney disease, stage 3 unspecified: Secondary | ICD-10-CM

## 2017-03-23 DIAGNOSIS — I10 Essential (primary) hypertension: Secondary | ICD-10-CM

## 2017-03-23 DIAGNOSIS — F329 Major depressive disorder, single episode, unspecified: Secondary | ICD-10-CM | POA: Diagnosis not present

## 2017-03-23 DIAGNOSIS — L570 Actinic keratosis: Secondary | ICD-10-CM | POA: Diagnosis not present

## 2017-03-23 DIAGNOSIS — R1319 Other dysphagia: Secondary | ICD-10-CM

## 2017-03-23 DIAGNOSIS — L0293 Carbuncle, unspecified: Secondary | ICD-10-CM

## 2017-03-23 DIAGNOSIS — E790 Hyperuricemia without signs of inflammatory arthritis and tophaceous disease: Secondary | ICD-10-CM

## 2017-03-23 DIAGNOSIS — Z0000 Encounter for general adult medical examination without abnormal findings: Secondary | ICD-10-CM

## 2017-03-23 DIAGNOSIS — E2839 Other primary ovarian failure: Secondary | ICD-10-CM

## 2017-03-23 DIAGNOSIS — G894 Chronic pain syndrome: Secondary | ICD-10-CM

## 2017-03-23 DIAGNOSIS — R1310 Dysphagia, unspecified: Secondary | ICD-10-CM

## 2017-03-23 DIAGNOSIS — E1142 Type 2 diabetes mellitus with diabetic polyneuropathy: Secondary | ICD-10-CM

## 2017-03-23 DIAGNOSIS — M05741 Rheumatoid arthritis with rheumatoid factor of right hand without organ or systems involvement: Secondary | ICD-10-CM | POA: Diagnosis not present

## 2017-03-23 DIAGNOSIS — M05742 Rheumatoid arthritis with rheumatoid factor of left hand without organ or systems involvement: Secondary | ICD-10-CM

## 2017-03-23 DIAGNOSIS — Z6837 Body mass index (BMI) 37.0-37.9, adult: Secondary | ICD-10-CM | POA: Diagnosis not present

## 2017-03-23 LAB — POCT URINALYSIS DIP (MANUAL ENTRY)
BILIRUBIN UA: NEGATIVE mg/dL
Bilirubin, UA: NEGATIVE
Blood, UA: NEGATIVE
GLUCOSE UA: NEGATIVE mg/dL
Nitrite, UA: POSITIVE — AB
Protein Ur, POC: NEGATIVE mg/dL
UROBILINOGEN UA: 0.2 U/dL
pH, UA: 5.5 (ref 5.0–8.0)

## 2017-03-23 LAB — POCT GLYCOSYLATED HEMOGLOBIN (HGB A1C): Hemoglobin A1C: 7.2

## 2017-03-23 MED ORDER — ZOSTER VAC RECOMB ADJUVANTED 50 MCG/0.5ML IM SUSR: 0.5000 mL | Freq: Once | INTRAMUSCULAR | 1 refills | Status: AC

## 2017-03-23 MED ORDER — DOXYCYCLINE HYCLATE 100 MG PO CAPS: 100.0000 mg | ORAL_CAPSULE | Freq: Two times a day (BID) | ORAL | 0 refills | Status: DC

## 2017-03-23 MED ORDER — HYDROCODONE-ACETAMINOPHEN 10-325 MG PO TABS: 1.0000 | ORAL_TABLET | Freq: Four times a day (QID) | ORAL | 0 refills | Status: DC | PRN

## 2017-03-23 MED ORDER — OXYBUTYNIN CHLORIDE ER 15 MG PO TB24: 15.0000 mg | ORAL_TABLET | Freq: Every day | ORAL | 3 refills | Status: DC

## 2017-03-23 MED ORDER — DULOXETINE HCL 30 MG PO CPEP: 30.0000 mg | ORAL_CAPSULE | Freq: Every day | ORAL | 0 refills | Status: DC

## 2017-03-23 NOTE — Progress Notes (Signed)
Subjective:    Patient ID: Angel French, female    DOB: July 15, 1951, 66 y.o.   MRN: [id number]  03/23/2017  Annual Exam; Diabetes; Hyperlipidemia; Depression; and Back Pain   HPI This 66 y.o. female presents for Annual Wellness Examination and follow-up for chronic medical conditions.  Last physical:  10-06-2015 Pap smear:  2012 Mammogram:  10-19-2016 Colonoscopy:  10-02-2014 Bone density:NEVER Eye exam:   01/23/2016 Dental exam: every 6 months  Immunization History  Administered Date(s) Administered  . Hepatitis A, Adult 10/06/2015, 08/12/2016  . Influenza,inj,Quad PF,36+ Mos 07/16/2013, 07/10/2014, 10/15/2015, 08/12/2016  . Pneumococcal Conjugate-13 03/23/2017  . Pneumococcal Polysaccharide-23 07/10/2014  . Tdap 10/06/2015   BP Readings from Last 3 Encounters:  03/23/17 133/83  01/05/17 117/70  12/14/16 133/68   Wt Readings from Last 3 Encounters:  03/23/17 212 lb 12.8 oz (96.5 kg)  01/05/17 214 lb (97.1 kg)  12/14/16 210 lb (95.3 kg)   DMII: Patient reports good compliance with medication, good tolerance to medication, and good symptom control.    HTN: Patient reports good compliance with medication, good tolerance to medication, and good symptom control.    Hypercholesterolemia: Patient reports good compliance with medication, good tolerance to medication, and good symptom control.    Rhuematoid arthritis: Patient reports good compliance with medication, good tolerance to medication, and good symptom control.    DDD lumbar: Patient reports good compliance with medication, good tolerance to medication, and good symptom control.    Anxiety and depression: Patient reports good compliance with medication, good tolerance to medication, and good symptom control.    Elevated uric acid: taking Allopurinol; uric acid level normal.  Foot pain LEFT: no further injections have helped.  Podiatry referred to Dr. Maryjean Ka who is pain management provider.  Refuses to go.   Refuses changing medication; cancelling appointment. Having tremendous pain in foot.  Milinda Pointer keeps saying it is diabetic neuropathy; this pain is intense.  Feels like someone has grabbed toe and in a vice.  Does not believe it is completely neuropathy.  Does not believe that from lower back.  Has applied Lidoderm patches without benefit.   Skin lesion elbow: requesting removal today; bothersome; hits area regularly.  Genital wound: very tender for the past week; no itching or burning; no drainage.  No fever/chills/sweats. Not sexually active.  Dysphagia: choking more and more often; usually with specific foods.   Review of Systems  Constitutional: Negative for activity change, appetite change, chills, diaphoresis, fatigue, fever and unexpected weight change.  HENT: Negative for congestion, dental problem, drooling, ear discharge, ear pain, facial swelling, hearing loss, mouth sores, nosebleeds, postnasal drip, rhinorrhea, sinus pressure, sneezing, sore throat, tinnitus, trouble swallowing and voice change.   Eyes: Negative for photophobia, pain, discharge, redness, itching and visual disturbance.  Respiratory: Positive for choking. Negative for apnea, cough, chest tightness, shortness of breath, wheezing and stridor.   Cardiovascular: Negative for chest pain, palpitations and leg swelling.  Gastrointestinal: Negative for abdominal distention, abdominal pain, anal bleeding, blood in stool, constipation, diarrhea, nausea, rectal pain and vomiting.  Endocrine: Negative for cold intolerance, heat intolerance, polydipsia, polyphagia and polyuria.  Genitourinary: Positive for frequency and vaginal pain. Negative for decreased urine volume, difficulty urinating, dyspareunia, dysuria, enuresis, flank pain, genital sores, hematuria, menstrual problem, pelvic pain, urgency, vaginal bleeding and vaginal discharge.  Musculoskeletal: Positive for arthralgias, back pain and gait problem. Negative for joint  swelling, myalgias, neck pain and neck stiffness.  Skin: Positive for color change. Negative for pallor, rash  and wound.  Allergic/Immunologic: Negative for environmental allergies, food allergies and immunocompromised state.  Neurological: Positive for weakness and numbness. Negative for dizziness, tremors, seizures, syncope, facial asymmetry, speech difficulty, light-headedness and headaches.  Hematological: Negative for adenopathy. Does not bruise/bleed easily.  Psychiatric/Behavioral: Positive for dysphoric mood. Negative for agitation, behavioral problems, confusion, decreased concentration, hallucinations, self-injury, sleep disturbance and suicidal ideas. The patient is nervous/anxious. The patient is not hyperactive.     Past Medical History:  Diagnosis Date  . Allergy    generic allergy pill; Spring and Fall only.  . Anxiety   . Arthritis    DDD lumbar, R hip OA.  s/p ortho consult in past.  . Blood transfusion without reported diagnosis    Mountain climbing accident in [place].  . Brachial plexus disorders   . Cataract    B retractions.  . Chronic kidney disease    stage 3 per pt.   . Depression   . Diabetes mellitus   . Diabetic peripheral neuropathy associated with type 2 diabetes mellitus ([place])   . Diabetic retinopathy ([place])   . Diabetic retinopathy associated with type 2 diabetes mellitus ([place])    s/p laser treatment multiple.  Unable to drive.  Marland Kitchen GERD (gastroesophageal reflux disease)   . Hyperlipidemia   . Hypertension    controlled, off meds   . Neuromuscular disorder ([place])   . Rheumatoid arthritis ([place])   . Ulcer    Peptic ulcer H. Pylori + s/p treatment.  Upper GI diagnosed.[name] Prilosec PRN .   Past Surgical History:  Procedure Laterality Date  .  2 SPINAL INJECTIONS     . ABDOMINAL HYSTERECTOMY  11/02/1979   DUB; cervical dysplasia; ovaries intact.  . ABDOMINAL SURGERY     staph abcess   . [facility] Admission     age 31; three months in  [place].  Marland Kitchen CARDIAC CATHETERIZATION  11/02/2007   normal coronary arteries.  . CARPAL TUNNEL RELEASE     Bilateral.  . CATARACT EXTRACTION, BILATERAL    . CHOLECYSTECTOMY    . EYE SURGERY     Cataracts B. Laser surgery x 7 for Diabetic Retinopathy  . TONSILLECTOMY     Allergies  Allergen Reactions  . Codeine Anaphylaxis  . Contrast Media [Iodinated Diagnostic Agents] Anaphylaxis  . Nitrofurantoin Monohyd Macro Anaphylaxis  . Betadine [Povidone Iodine] Itching  . Folic Acid Itching  . Gabapentin Other (See Comments)    Makes patient feel drunk  . Iodine Hives  . Lyrica [Pregabalin] Other (See Comments)    Makes patient feel drunk  . Red Dye Itching  . Ultram [Tramadol Hcl] Nausea And Vomiting    Social History   Social History  . Marital status: Married    Spouse name: Marijean Niemann  . Number of children: 2  . Years of education: college   Occupational History  . retired     retretied   Social History Main Topics  . Smoking status: Former Research scientist (life sciences)  . Smokeless tobacco: Never Used     Comment: Quit 1987  . Alcohol use No  . Drug use: No  . Sexual activity: Yes    Birth control/ protection: Surgical, Post-menopausal     Comment: widow   Other Topics Concern  . Not on file   Social History Narrative   Marital status: widowed since 2009; dating x 6 years.  Happy; no abuse.      Children: 2 children (86 daughter, 56 son estranged); 2 grandchildren.  Lives: with boyfriend, daughter, granddaughter, friend of daughter.  Lives in pt house.      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  Diabetic retinopathy; unable to drive.      Tobacco:  Smoked x 20 years; quit 20 years.      Alcohol:  On special occasions; once per week on average.       Drugs:  None since college.      Exercise:  Walking several times per week; walks the dog.   Education college   Caffeine one cup daily.   Right handed      Advanced Directives: none; FULL CODE.   DNR/DNI.  HCPOA: [place]?              Family History  Problem Relation Age of Onset  . Adopted: Yes  . Family history unknown: Yes       Objective:    BP 133/83 (BP Location: Right Arm, Patient Position: Sitting, Cuff Size: Large)   Pulse 84   Temp 99.6 F (37.6 C) (Oral)   Resp 16   Ht 5\' 2"  (1.575 m)   Wt 212 lb 12.8 oz (96.5 kg)   SpO2 95%   BMI 38.92 kg/m  Physical Exam  Constitutional: She is oriented to person, place, and time. She appears well-developed and well-nourished. No distress.  HENT:  Head: Normocephalic and atraumatic.  Right Ear: External ear normal.  Left Ear: External ear normal.  Nose: Nose normal.  Mouth/Throat: Oropharynx is clear and moist.  Eyes: Conjunctivae and EOM are normal. Pupils are equal, round, and reactive to light.  Neck: Normal range of motion and full passive range of motion without pain. Neck supple. No JVD present. Carotid bruit is not present. No thyromegaly present.  Cardiovascular: Normal rate, regular rhythm and normal heart sounds.  Exam reveals no gallop and no friction rub.   No murmur heard. Pulmonary/Chest: Effort normal and breath sounds normal. She has no wheezes. She has no rales. Right breast exhibits no inverted nipple, no mass, no nipple discharge, no skin change and no tenderness. Left breast exhibits no inverted nipple, no mass, no nipple discharge, no skin change and no tenderness. Breasts are symmetrical.  Abdominal: Soft. Bowel sounds are normal. She exhibits no distension and no mass. There is no tenderness. There is no rebound and no guarding.  Genitourinary: There is no rash, tenderness, lesion or injury on the right labia. There is tenderness and lesion on the left labia. There is no rash on the left labia.  Genitourinary Comments: Healing pustule L labia majora.  Musculoskeletal:       Right shoulder: Normal.       Left shoulder: Normal.       Cervical back: Normal.  Lymphadenopathy:    She has no cervical  adenopathy.  Neurological: She is alert and oriented to person, place, and time. She has normal reflexes. No cranial nerve deficit. She exhibits normal muscle tone. Coordination normal.  Skin: Skin is warm and dry. Rash noted. Rash is maculopapular. She is not diaphoretic. No erythema. No pallor.  Scaling skin lesion elbow extensor surface.  Psychiatric: She has a normal mood and affect. Her behavior is normal. Judgment and thought content normal.  Nursing note and vitals reviewed.  Depression screen [facility] 2/9 03/23/2017 01/05/2017 12/14/2016 12/14/2016 11/27/2016  Decreased Interest 0 0 0 0 0  Down, Depressed, Hopeless 0 0 0 0 0  PHQ - 2 Score 0 0 0 0 0  Fall Risk  03/23/2017 01/05/2017 12/14/2016 12/14/2016 11/27/2016  Falls in the past year? Yes Yes Yes No Yes  Number falls in past yr: 2 or more 2 or more 1 - 2 or more  Injury with Fall? (No Data) No No - Yes  Risk for fall due to : - - - - -   Functional Status Survey: Is the patient deaf or have difficulty hearing?: Yes Does the patient have difficulty seeing, even when wearing glasses/contacts?: No Does the patient have difficulty concentrating, remembering, or making decisions?: No Does the patient have difficulty walking or climbing stairs?: Yes Does the patient have difficulty dressing or bathing?: No Does the patient have difficulty doing errands alone such as visiting a doctor's office or shopping?: Yes (patient does not drive)  PROCEDURE: VERBAL CONSENT OBTAINED; CRYOTHERAPY TO ELBOW LESION X 3.  PT TOLERATED WELL.    Assessment & Plan:   1. Encounter for Medicare annual wellness exam   2. Essential hypertension, benign   3. Diabetic peripheral neuropathy associated with type 2 diabetes mellitus ([place])   4. Degenerative disc disease, lumbar   5. Rheumatoid arthritis involving both hands with positive rheumatoid factor (HCC)   6. Chronic renal impairment, stage 3 (moderate)   7. Chronic pain syndrome   8. Reactive depression   9.  Elevated blood uric acid level   10. Pure hypercholesterolemia   11. Class 2 severe obesity due to excess calories with serious comorbidity and body mass index (BMI) of 37.0 to 37.9 in adult ([place])   12. Estrogen deficiency   13. Need for prophylactic vaccination against Streptococcus pneumoniae (pneumococcus)   14. Esophageal dysphagia   15. Carbuncle   16. Actinic keratosis    -anticipatory guidance provided --- exercise, weight loss, safe driving practices, aspirin 81mg  daily. -obtain age appropriate screening labs and labs for chronic disease management. -moderate fall risk; no evidence of depression; no evidence of hearing loss.  Discussed advanced directives and living will; also discussed end of life issues including code status.  -suffers with DDD lumbar with chronic pain; pain contract signed today; UDS obtained per CDC guidelines; refills provided. -refer to GI for dysphagia.   -s/p cryotherapy to elbow skin lesion; pt tolerated well. -rx for Doxy provided for carbuncle in vaginal area. -followed by rheumatology and nephrology.    Orders Placed This Encounter  Procedures  . DG Bone Density    Standing Status:   Future    Standing Expiration Date:   05/23/2018    Order Specific Question:   Reason for Exam (SYMPTOM  OR DIAGNOSIS REQUIRED)    Answer:   ESTROGEN DEFICIENCY    Order Specific Question:   Preferred imaging location?    Answer:   [facility]  . Pneumococcal conjugate vaccine 13-valent IM  . CBC with Differential/Platelet  . Comprehensive metabolic panel    Order Specific Question:   Has the patient fasted?    Answer:   Yes  . Hemoglobin A1c  . Lipid panel    Order Specific Question:   Has the patient fasted?    Answer:   Yes  . Microalbumin, urine  . TSH  . Pain Management Screening Profile (10S)  . Ambulatory referral to Gastroenterology    Referral Priority:   Routine    Referral Type:   Consultation    Referral Reason:   Specialty Services Required     Number of Visits Requested:   1  . POCT urinalysis dipstick  . POCT glycosylated  hemoglobin (Hb A1C)   Meds ordered this encounter  Medications  . Zoster Vac Recomb Adjuvanted [facility]) injection    Sig: Inject 0.5 mLs into the muscle once.    Dispense:  0.5 mL    Refill:  1  . DULoxetine (CYMBALTA) 30 MG capsule    Sig: Take 1 capsule (30 mg total) by mouth daily.    Dispense:  90 capsule    Refill:  0  . oxybutynin (DITROPAN XL) 15 MG 24 hr tablet    Sig: Take 1 tablet (15 mg total) by mouth at bedtime.    Dispense:  90 tablet    Refill:  3  . DISCONTD: HYDROcodone-acetaminophen (NORCO) 10-325 MG tablet    Sig: Take 1 tablet by mouth every 6 (six) hours as needed.    Dispense:  90 tablet    Refill:  0    Do not fill for 60 days after prescribed  . DISCONTD: HYDROcodone-acetaminophen (NORCO) 10-325 MG tablet    Sig: Take 1 tablet by mouth every 6 (six) hours as needed.    Dispense:  90 tablet    Refill:  0    Do not fill for 30 days after prescribed  . HYDROcodone-acetaminophen (NORCO) 10-325 MG tablet    Sig: Take 1 tablet by mouth every 6 (six) hours as needed.    Dispense:  90 tablet    Refill:  0  . doxycycline (VIBRAMYCIN) 100 MG capsule    Sig: Take 1 capsule (100 mg total) by mouth 2 (two) times daily.    Dispense:  20 capsule    Refill:  0    Return in about 3 months (around 06/23/2017) for recheck diabetes, chronic pain syndrome.   Deveney Bayon Elayne Guerin, M.D. Primary Care at [facility] previously [facility] [place] [address] [place], [place]  [address] [phone] phone [phone] fax

## 2017-03-23 NOTE — Patient Instructions (Addendum)
IF you received an x-ray today, you will receive an invoice from [facility]. Please contact [facility] at [phone] with questions or concerns regarding your invoice.   IF you received labwork today, you will receive an invoice from [facility]. Please contact [facility] at [phone] with questions or concerns regarding your invoice.   Our billing staff will not be able to assist you with questions regarding bills from these companies.  You will be contacted with the lab results as soon as they are available. The fastest way to get your results is to activate your My Chart account. Instructions are located on the last page of this paperwork. If you have not heard from [name] regarding the results in 2 weeks, please contact this office.    prev Preventive Care 4 Years and Older, Female Preventive care refers to lifestyle choices and visits with your health care provider that can promote health and wellness. What does preventive care include?  A yearly physical exam. This is also called an annual well check.  Dental exams once or twice a year.  Routine eye exams. Ask your health care provider how often you should have your eyes checked.  Personal lifestyle choices, including:  Daily care of your teeth and gums.  Regular physical activity.  Eating a healthy diet.  Avoiding tobacco and drug use.  Limiting alcohol use.  Practicing safe sex.  Taking low-dose aspirin every day.  Taking vitamin and mineral supplements as recommended by your health care provider. What happens during an annual well check? The services and screenings done by your health care provider during your annual well check will depend on your age, overall health, lifestyle risk factors, and family history of disease. Counseling  Your health care provider may ask you questions about your:  Alcohol use.  Tobacco use.  Drug use.  Emotional well-being.  Home and relationship  well-being.  Sexual activity.  Eating habits.  History of falls.  Memory and ability to understand (cognition).  Work and work Statistician.  Reproductive health. Screening  You may have the following tests or measurements:  Height, weight, and BMI.  Blood pressure.  Lipid and cholesterol levels. These may be checked every 5 years, or more frequently if you are over 42 years old.  Skin check.  Lung cancer screening. You may have this screening every year starting at age 31 if you have a 30-pack-year history of smoking and currently smoke or have quit within the past 15 years.  Fecal occult blood test (FOBT) of the stool. You may have this test every year starting at age 74.  Flexible sigmoidoscopy or colonoscopy. You may have a sigmoidoscopy every 5 years or a colonoscopy every 10 years starting at age 67.  Hepatitis C blood test.  Hepatitis B blood test.  Sexually transmitted disease (STD) testing.  Diabetes screening. This is done by checking your blood sugar (glucose) after you have not eaten for a while (fasting). You may have this done every 1-3 years.  Bone density scan. This is done to screen for osteoporosis. You may have this done starting at age 71.  Mammogram. This may be done every 1-2 years. Talk to your health care provider about how often you should have regular mammograms. Talk with your health care provider about your test results, treatment options, and if necessary, the need for more tests. Vaccines  Your health care provider may recommend certain vaccines, such as:  Influenza vaccine. This is recommended every year.  Tetanus, diphtheria, and acellular pertussis (Tdap, Td) vaccine. You may need a Td booster every 10 years.  Varicella vaccine. You may need this if you have not been vaccinated.  Zoster vaccine. You may need this after age 70.  Measles, mumps, and rubella (MMR) vaccine. You may need at least one dose of MMR if you were born in 1957  or later. You may also need a second dose.  Pneumococcal 13-valent conjugate (PCV13) vaccine. One dose is recommended after age 32.  Pneumococcal polysaccharide (PPSV23) vaccine. One dose is recommended after age 59.  Meningococcal vaccine. You may need this if you have certain conditions.  Hepatitis A vaccine. You may need this if you have certain conditions or if you travel or work in places where you may be exposed to hepatitis A.  Hepatitis B vaccine. You may need this if you have certain conditions or if you travel or work in places where you may be exposed to hepatitis B.  Haemophilus influenzae type b (Hib) vaccine. You may need this if you have certain conditions. Talk to your health care provider about which screenings and vaccines you need and how often you need them. This information is not intended to replace advice given to you by your health care provider. Make sure you discuss any questions you have with your health care provider. Document Released: 11/14/2015 Document Revised: 07/07/2016 Document Reviewed: 08/19/2015 Elsevier Interactive Patient Education  2017 [employer].

## 2017-03-24 ENCOUNTER — Other Ambulatory Visit: Payer: Self-pay | Admitting: Family Medicine

## 2017-03-24 LAB — CBC WITH DIFFERENTIAL/PLATELET
BASOS: 0 %
Basophils Absolute: 0 10*3/uL (ref 0.0–0.2)
EOS (ABSOLUTE): 0.1 10*3/uL (ref 0.0–0.4)
EOS: 2 %
HEMATOCRIT: 34.3 % (ref 34.0–46.6)
Hemoglobin: 11.2 g/dL (ref 11.1–15.9)
IMMATURE GRANS (ABS): 0 10*3/uL (ref 0.0–0.1)
IMMATURE GRANULOCYTES: 0 %
Lymphocytes Absolute: 1.8 10*3/uL (ref 0.7–3.1)
Lymphs: 31 %
MCH: 29.9 pg (ref 26.6–33.0)
MCHC: 32.7 g/dL (ref 31.5–35.7)
MCV: 92 fL (ref 79–97)
MONOS ABS: 0.4 10*3/uL (ref 0.1–0.9)
Monocytes: 8 %
NEUTROS PCT: 59 %
Neutrophils Absolute: 3.5 10*3/uL (ref 1.4–7.0)
Platelets: 256 10*3/uL (ref 150–379)
RBC: 3.74 x10E6/uL — ABNORMAL LOW (ref 3.77–5.28)
RDW: 14.4 % (ref 12.3–15.4)
WBC: 5.9 10*3/uL (ref 3.4–10.8)

## 2017-03-24 LAB — PMP SCREEN PROFILE (10S), URINE
AMPHETAMINE SCREEN URINE: NEGATIVE ng/mL
BARBITURATE SCREEN URINE: NEGATIVE ng/mL
BENZODIAZEPINE SCREEN, URINE: NEGATIVE ng/mL
CANNABINOIDS UR QL SCN: NEGATIVE ng/mL
Cocaine (Metab) Scrn, Ur: NEGATIVE ng/mL
Creatinine(Crt), U: 79 mg/dL (ref 20.0–300.0)
METHADONE SCREEN, URINE: NEGATIVE ng/mL
OXYCODONE+OXYMORPHONE UR QL SCN: NEGATIVE ng/mL
Opiate Scrn, Ur: POSITIVE ng/mL
PH UR, DRUG SCRN: 5.6 (ref 4.5–8.9)
PHENCYCLIDINE QUANTITATIVE URINE: NEGATIVE ng/mL
PROPOXYPHENE SCREEN URINE: NEGATIVE ng/mL

## 2017-03-24 LAB — COMPREHENSIVE METABOLIC PANEL
A/G RATIO: 1.5 (ref 1.2–2.2)
ALT: 49 IU/L — ABNORMAL HIGH (ref 0–32)
AST: 41 IU/L — ABNORMAL HIGH (ref 0–40)
Albumin: 4.1 g/dL (ref 3.6–4.8)
Alkaline Phosphatase: 73 IU/L (ref 39–117)
BUN/Creatinine Ratio: 14 (ref 12–28)
BUN: 22 mg/dL (ref 8–27)
Bilirubin Total: 0.5 mg/dL (ref 0.0–1.2)
CALCIUM: 9.5 mg/dL (ref 8.7–10.3)
CO2: 30 mmol/L — ABNORMAL HIGH (ref 18–29)
Chloride: 96 mmol/L (ref 96–106)
Creatinine, Ser: 1.57 mg/dL — ABNORMAL HIGH (ref 0.57–1.00)
GFR, EST AFRICAN AMERICAN: 40 mL/min/{1.73_m2} — AB (ref >59)
GFR, EST NON AFRICAN AMERICAN: 34 mL/min/{1.73_m2} — AB (ref >59)
GLOBULIN, TOTAL: 2.8 g/dL (ref 1.5–4.5)
Glucose: 74 mg/dL (ref 65–99)
POTASSIUM: 3.8 mmol/L (ref 3.5–5.2)
SODIUM: 141 mmol/L (ref 134–144)
TOTAL PROTEIN: 6.9 g/dL (ref 6.0–8.5)

## 2017-03-24 LAB — HEMOGLOBIN A1C
ESTIMATED AVERAGE GLUCOSE: 163 mg/dL
Hgb A1c MFr Bld: 7.3 % — ABNORMAL HIGH (ref 4.8–5.6)

## 2017-03-24 LAB — LIPID PANEL
CHOL/HDL RATIO: 4.8 ratio — AB (ref 0.0–4.4)
Cholesterol, Total: 195 mg/dL (ref 100–199)
HDL: 41 mg/dL (ref >39)
LDL CALC: 112 mg/dL — AB (ref 0–99)
TRIGLYCERIDES: 212 mg/dL — AB (ref 0–149)
VLDL Cholesterol Cal: 42 mg/dL — ABNORMAL HIGH (ref 5–40)

## 2017-03-24 LAB — MICROALBUMIN, URINE: Microalbumin, Urine: <3 ug/mL

## 2017-03-24 LAB — TSH: TSH: 1.06 u[IU]/mL (ref 0.450–4.500)

## 2017-04-01 DIAGNOSIS — M0609 Rheumatoid arthritis without rheumatoid factor, multiple sites: Secondary | ICD-10-CM | POA: Diagnosis not present

## 2017-04-01 DIAGNOSIS — Z79899 Other long term (current) drug therapy: Secondary | ICD-10-CM | POA: Diagnosis not present

## 2017-04-09 ENCOUNTER — Other Ambulatory Visit: Payer: Self-pay | Admitting: Family Medicine

## 2017-05-01 ENCOUNTER — Other Ambulatory Visit: Payer: Self-pay | Admitting: Family Medicine

## 2017-05-01 MED ORDER — SIMVASTATIN 40 MG PO TABS: 40.0000 mg | ORAL_TABLET | Freq: Every day | ORAL | 1 refills | Status: DC

## 2017-05-02 ENCOUNTER — Telehealth: Payer: Self-pay | Admitting: *Deleted

## 2017-05-02 DIAGNOSIS — H16223 Keratoconjunctivitis sicca, not specified as Sjogren's, bilateral: Secondary | ICD-10-CM | POA: Diagnosis not present

## 2017-05-02 DIAGNOSIS — H04123 Dry eye syndrome of bilateral lacrimal glands: Secondary | ICD-10-CM | POA: Diagnosis not present

## 2017-05-02 DIAGNOSIS — H40013 Open angle with borderline findings, low risk, bilateral: Secondary | ICD-10-CM | POA: Diagnosis not present

## 2017-05-03 MED ORDER — GLUCOSE BLOOD VI STRP: ORAL_STRIP | 3 refills | Status: DC

## 2017-05-03 MED ORDER — CLOBETASOL PROPIONATE 0.05 % EX OINT: 1.0000 "application " | TOPICAL_OINTMENT | Freq: Two times a day (BID) | CUTANEOUS | 0 refills | Status: DC

## 2017-05-03 NOTE — Telephone Encounter (Signed)
Rx sent to pharmacy as requested; test strips and clobetasol.

## 2017-05-13 ENCOUNTER — Other Ambulatory Visit: Payer: Self-pay | Admitting: Gastroenterology

## 2017-05-13 DIAGNOSIS — R0689 Other abnormalities of breathing: Secondary | ICD-10-CM | POA: Diagnosis not present

## 2017-05-13 DIAGNOSIS — R1314 Dysphagia, pharyngoesophageal phase: Secondary | ICD-10-CM | POA: Diagnosis not present

## 2017-05-16 ENCOUNTER — Other Ambulatory Visit (HOSPITAL_COMMUNITY): Payer: Self-pay | Admitting: Gastroenterology

## 2017-05-16 DIAGNOSIS — R1319 Other dysphagia: Secondary | ICD-10-CM

## 2017-05-17 DIAGNOSIS — M109 Gout, unspecified: Secondary | ICD-10-CM | POA: Diagnosis not present

## 2017-05-17 DIAGNOSIS — Z6839 Body mass index (BMI) 39.0-39.9, adult: Secondary | ICD-10-CM | POA: Diagnosis not present

## 2017-05-17 DIAGNOSIS — M2550 Pain in unspecified joint: Secondary | ICD-10-CM | POA: Diagnosis not present

## 2017-05-17 DIAGNOSIS — M0609 Rheumatoid arthritis without rheumatoid factor, multiple sites: Secondary | ICD-10-CM | POA: Diagnosis not present

## 2017-05-17 DIAGNOSIS — R682 Dry mouth, unspecified: Secondary | ICD-10-CM | POA: Diagnosis not present

## 2017-05-17 DIAGNOSIS — Z79899 Other long term (current) drug therapy: Secondary | ICD-10-CM | POA: Diagnosis not present

## 2017-05-17 DIAGNOSIS — M5136 Other intervertebral disc degeneration, lumbar region: Secondary | ICD-10-CM | POA: Diagnosis not present

## 2017-05-17 DIAGNOSIS — M791 Myalgia: Secondary | ICD-10-CM | POA: Diagnosis not present

## 2017-05-17 DIAGNOSIS — E669 Obesity, unspecified: Secondary | ICD-10-CM | POA: Diagnosis not present

## 2017-05-24 ENCOUNTER — Ambulatory Visit (HOSPITAL_COMMUNITY)
Admission: RE | Admit: 2017-05-24 | Discharge: 2017-05-24 | Disposition: A | Discharge status: Home / Self Care | Payer: Medicare Other | Source: Ambulatory Visit | Attending: Gastroenterology | Admitting: Gastroenterology

## 2017-05-24 DIAGNOSIS — R1310 Dysphagia, unspecified: Secondary | ICD-10-CM | POA: Diagnosis not present

## 2017-05-24 DIAGNOSIS — K219 Gastro-esophageal reflux disease without esophagitis: Secondary | ICD-10-CM | POA: Insufficient documentation

## 2017-05-24 DIAGNOSIS — E1122 Type 2 diabetes mellitus with diabetic chronic kidney disease: Secondary | ICD-10-CM | POA: Insufficient documentation

## 2017-05-24 DIAGNOSIS — N183 Chronic kidney disease, stage 3 (moderate): Secondary | ICD-10-CM | POA: Insufficient documentation

## 2017-05-24 DIAGNOSIS — E11319 Type 2 diabetes mellitus with unspecified diabetic retinopathy without macular edema: Secondary | ICD-10-CM | POA: Diagnosis not present

## 2017-05-24 DIAGNOSIS — E1142 Type 2 diabetes mellitus with diabetic polyneuropathy: Secondary | ICD-10-CM | POA: Insufficient documentation

## 2017-05-24 DIAGNOSIS — M5136 Other intervertebral disc degeneration, lumbar region: Secondary | ICD-10-CM | POA: Insufficient documentation

## 2017-05-24 DIAGNOSIS — F329 Major depressive disorder, single episode, unspecified: Secondary | ICD-10-CM | POA: Insufficient documentation

## 2017-05-24 DIAGNOSIS — E785 Hyperlipidemia, unspecified: Secondary | ICD-10-CM | POA: Insufficient documentation

## 2017-05-24 DIAGNOSIS — Z8711 Personal history of peptic ulcer disease: Secondary | ICD-10-CM | POA: Insufficient documentation

## 2017-05-24 DIAGNOSIS — M069 Rheumatoid arthritis, unspecified: Secondary | ICD-10-CM | POA: Diagnosis not present

## 2017-05-24 DIAGNOSIS — R1319 Other dysphagia: Secondary | ICD-10-CM | POA: Insufficient documentation

## 2017-05-24 DIAGNOSIS — F419 Anxiety disorder, unspecified: Secondary | ICD-10-CM | POA: Diagnosis not present

## 2017-05-24 DIAGNOSIS — R05 Cough: Secondary | ICD-10-CM | POA: Diagnosis not present

## 2017-05-24 DIAGNOSIS — I129 Hypertensive chronic kidney disease with stage 1 through stage 4 chronic kidney disease, or unspecified chronic kidney disease: Secondary | ICD-10-CM | POA: Diagnosis not present

## 2017-05-24 DIAGNOSIS — R1314 Dysphagia, pharyngoesophageal phase: Secondary | ICD-10-CM | POA: Diagnosis not present

## 2017-05-27 DIAGNOSIS — M0609 Rheumatoid arthritis without rheumatoid factor, multiple sites: Secondary | ICD-10-CM | POA: Diagnosis not present

## 2017-06-13 DIAGNOSIS — Q399 Congenital malformation of esophagus, unspecified: Secondary | ICD-10-CM | POA: Diagnosis not present

## 2017-06-13 DIAGNOSIS — K3189 Other diseases of stomach and duodenum: Secondary | ICD-10-CM | POA: Diagnosis not present

## 2017-06-13 DIAGNOSIS — R6881 Early satiety: Secondary | ICD-10-CM | POA: Diagnosis not present

## 2017-06-13 DIAGNOSIS — R933 Abnormal findings on diagnostic imaging of other parts of digestive tract: Secondary | ICD-10-CM | POA: Diagnosis not present

## 2017-06-13 DIAGNOSIS — K2980 Duodenitis without bleeding: Secondary | ICD-10-CM | POA: Diagnosis not present

## 2017-06-13 DIAGNOSIS — K319 Disease of stomach and duodenum, unspecified: Secondary | ICD-10-CM | POA: Diagnosis not present

## 2017-06-13 DIAGNOSIS — R1310 Dysphagia, unspecified: Secondary | ICD-10-CM | POA: Diagnosis not present

## 2017-06-13 DIAGNOSIS — R140 Abdominal distension (gaseous): Secondary | ICD-10-CM | POA: Diagnosis not present

## 2017-06-14 DIAGNOSIS — K319 Disease of stomach and duodenum, unspecified: Secondary | ICD-10-CM | POA: Diagnosis not present

## 2017-06-14 DIAGNOSIS — K2980 Duodenitis without bleeding: Secondary | ICD-10-CM | POA: Diagnosis not present

## 2017-06-15 DIAGNOSIS — M0609 Rheumatoid arthritis without rheumatoid factor, multiple sites: Secondary | ICD-10-CM | POA: Diagnosis not present

## 2017-06-20 ENCOUNTER — Other Ambulatory Visit: Payer: Self-pay | Admitting: Family Medicine

## 2017-06-22 ENCOUNTER — Other Ambulatory Visit: Payer: Self-pay | Admitting: Family Medicine

## 2017-06-23 ENCOUNTER — Ambulatory Visit
Admission: RE | Admit: 2017-06-23 | Discharge: 2017-06-23 | Disposition: A | Discharge status: Home / Self Care | Payer: Medicare Other | Source: Ambulatory Visit | Attending: Family Medicine | Admitting: Family Medicine

## 2017-06-23 DIAGNOSIS — Z780 Asymptomatic menopausal state: Secondary | ICD-10-CM | POA: Diagnosis not present

## 2017-06-23 DIAGNOSIS — Z13820 Encounter for screening for osteoporosis: Secondary | ICD-10-CM | POA: Diagnosis not present

## 2017-06-23 DIAGNOSIS — E2839 Other primary ovarian failure: Secondary | ICD-10-CM

## 2017-06-27 NOTE — Progress Notes (Signed)
Subjective:    Patient ID: Angel French, female    DOB: 09-01-1951, 66 y.o.   MRN: [id number]  06/28/2017  Diabetes (3 month follow-up); Pain; and Medication Refill (allopurinol, Cymbalta, and Simvastatin)   HPI This 66 y.o. female presents for three month follow-up of DMII, chronic pain syndrome due to DDD lumbar spine, anxiety/depression, and rheumatoid arthritis.  Management changes made at last visit include: anticipatory guidance provided --- exercise, weight loss, safe driving practices, aspirin 81mg  daily. -obtain age appropriate screening labs and labs for chronic disease management. -moderate fall risk; no evidence of depression; no evidence of hearing loss.  Discussed advanced directives and living will; also discussed end of life issues including code status.  -suffers with DDD lumbar with chronic pain; pain contract signed today; UDS obtained per CDC guidelines; refills provided. -refer to GI for dysphagia.   -s/p cryotherapy to elbow skin lesion; pt tolerated well. -rx for Doxy provided for carbuncle in vaginal area. -followed by rheumatology and nephrology.  -Lab review included the following recommendations: Call-Liver function tests are slightly elevated; I recommend repeating at follow-up visit. -Hemoglobin A1c is stable at 7.3. I would live to see it less than 7.0.  -Cholesterol remains elevated. I recommend increasing Simvastatin dose to 40mg  daily. I will send in a new prescription.         S/p EGD WNL; still choking.  S/p modified barium study;   Leg swelling: swollen to calves B.  LEFT knee feel: fell on knee recently; not sure when fell.  Stumbles a lot.  Did something.  Likely due to foot.    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  Now moved to RIGHT foot.  Pain with heavy foot; now in RIGHT toes.  Gabapentin makes drunk.  Lyrica makes drunk.  Can take small tiny dose but does not help with pain.  Does not drive.    Anxiety and  depression: doing really well with addition of Cymbalta to Lexapro.  Dysuria: onset this week.  No hematuria; +frequency. BP Readings from Last 3 Encounters:  06/28/17 (!) 142/74  03/23/17 133/83  01/05/17 117/70   Wt Readings from Last 3 Encounters:  06/28/17 223 lb (101.2 kg)  03/23/17 212 lb 12.8 oz (96.5 kg)  01/05/17 214 lb (97.1 kg)   Immunization History  Administered Date(s) Administered  . Hepatitis A, Adult 10/06/2015, 08/12/2016  . Influenza,inj,Quad PF,6+ Mos 07/16/2013, 07/10/2014, 10/15/2015, 08/12/2016, 06/28/2017  . Pneumococcal Conjugate-13 03/23/2017  . Pneumococcal Polysaccharide-23 07/10/2014  . Tdap 10/06/2015    Review of Systems  Constitutional: Negative for chills, diaphoresis, fatigue and fever.  Eyes: Negative for visual disturbance.  Respiratory: Negative for cough and shortness of breath.   Cardiovascular: Positive for leg swelling. Negative for chest pain and palpitations.  Gastrointestinal: Negative for abdominal pain, constipation, diarrhea, nausea and vomiting.  Endocrine: Negative for cold intolerance, heat intolerance, polydipsia, polyphagia and polyuria.  Genitourinary: Positive for dysuria, frequency and urgency. Negative for hematuria.  Musculoskeletal: Positive for back pain, gait problem and myalgias. Negative for joint swelling.  Neurological: Positive for numbness. Negative for dizziness, tremors, seizures, syncope, facial asymmetry, speech difficulty, weakness, light-headedness and headaches.  Psychiatric/Behavioral: The patient is not nervous/anxious.     Past Medical History:  Diagnosis Date  . Allergy    generic allergy pill; Spring and Fall only.  . Anxiety   . Arthritis    DDD lumbar, R hip OA.  s/p ortho consult in past.  . Blood transfusion  without reported diagnosis    Mountain climbing accident in [place].  . Brachial plexus disorders   . Cataract    B retractions.  . Chronic kidney disease    stage 3 per pt.   .  Depression   . Diabetes mellitus   . Diabetic peripheral neuropathy associated with type 2 diabetes mellitus ([place])   . Diabetic retinopathy ([place])   . Diabetic retinopathy associated with type 2 diabetes mellitus ([place])    s/p laser treatment multiple.  Unable to drive.  Marland Kitchen GERD (gastroesophageal reflux disease)   . Hyperlipidemia   . Hypertension    controlled, off meds   . Neuromuscular disorder ([place])   . Rheumatoid arthritis ([place])   . Ulcer    Peptic ulcer H. Pylori + s/p treatment.  Upper GI diagnosed.[name] Prilosec PRN .   Past Surgical History:  Procedure Laterality Date  .  2 SPINAL INJECTIONS     . ABDOMINAL HYSTERECTOMY  11/02/1979   DUB; cervical dysplasia; ovaries intact.  . ABDOMINAL SURGERY     staph abcess   . [facility] Admission     age 74; three months in [place].  Marland Kitchen CARDIAC CATHETERIZATION  11/02/2007   normal coronary arteries.  . CARPAL TUNNEL RELEASE     Bilateral.  . CATARACT EXTRACTION, BILATERAL    . CHOLECYSTECTOMY    . EYE SURGERY     Cataracts B. Laser surgery x 7 for Diabetic Retinopathy  . TONSILLECTOMY     Allergies  Allergen Reactions  . Codeine Anaphylaxis  . Contrast Media [Iodinated Diagnostic Agents] Anaphylaxis  . Nitrofurantoin Monohyd Macro Anaphylaxis  . Betadine [Povidone Iodine] Itching  . Folic Acid Itching  . Gabapentin Other (See Comments)    Makes patient feel drunk  . Iodine Hives  . Lyrica [Pregabalin] Other (See Comments)    Makes patient feel drunk  . Red Dye Itching  . Ultram [Tramadol Hcl] Nausea And Vomiting    Social History   Social History  . Marital status: Married    Spouse name: Marijean Niemann  . Number of children: 2  . Years of education: college   Occupational History  . retired     retretied   Social History Main Topics  . Smoking status: Former Research scientist (life sciences)  . Smokeless tobacco: Never Used     Comment: Quit 1987  . Alcohol use No  . Drug use: No  . Sexual activity: Yes    Birth control/  protection: Surgical, Post-menopausal     Comment: widow   Other Topics Concern  . Not on file   Social History Narrative   Marital status: widowed since 2009; dating x 6 years.  Happy; no abuse.      Children: 2 children (71 daughter, 69 son estranged); 2 grandchildren.      Lives: with boyfriend, daughter, granddaughter, friend of daughter.  Lives in pt house.      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  Diabetic retinopathy; unable to drive.      Tobacco:  Smoked x 20 years; quit 20 years.      Alcohol:  On special occasions; once per week on average.       Drugs:  None since college.      Exercise:  Walking several times per week; walks the dog.   Education college   Caffeine one cup daily.   Right handed      Advanced Directives: none; FULL CODE.  DNR/DNI.  HCPOA: [place]?              Family History  Problem Relation Age of Onset  . Adopted: Yes  . Family history unknown: Yes       Objective:    BP (!) 142/74   Pulse 99   Temp 98.1 F (36.7 C) (Oral)   Resp 16   Ht 5' 2.21" (1.58 m)   Wt 223 lb (101.2 kg)   SpO2 95%   BMI 40.52 kg/m  Physical Exam  Constitutional: She is oriented to person, place, and time. She appears well-developed and well-nourished. No distress.  HENT:  Head: Normocephalic and atraumatic.  Right Ear: External ear normal.  Left Ear: External ear normal.  Nose: Nose normal.  Mouth/Throat: Oropharynx is clear and moist.  Eyes: Pupils are equal, round, and reactive to light. Conjunctivae and EOM are normal.  Neck: Normal range of motion. Neck supple. Carotid bruit is not present. No thyromegaly present.  Cardiovascular: Normal rate, regular rhythm, normal heart sounds and intact distal pulses.  Exam reveals no gallop and no friction rub.   No murmur heard. Pulmonary/Chest: Effort normal and breath sounds normal. She has no wheezes. She has no rales.  Abdominal: Soft. Bowel sounds are normal. She exhibits no  distension and no mass. There is no tenderness. There is no rebound and no guarding.  Lymphadenopathy:    She has no cervical adenopathy.  Neurological: She is alert and oriented to person, place, and time. No cranial nerve deficit.  Skin: Skin is warm and dry. No rash noted. She is not diaphoretic. No erythema. No pallor.  Psychiatric: She has a normal mood and affect. Her behavior is normal.   Results for orders placed or performed in visit on 06/28/17  POCT glycosylated hemoglobin (Hb A1C)  Result Value Ref Range   Hemoglobin A1C 7.0    No results found. Depression screen [facility] 2/9 06/28/2017 03/23/2017 01/05/2017 12/14/2016 12/14/2016  Decreased Interest 0 0 0 0 0  Down, Depressed, Hopeless 0 0 0 0 0  PHQ - 2 Score 0 0 0 0 0   Fall Risk  06/28/2017 03/23/2017 01/05/2017 12/14/2016 12/14/2016  Falls in the past year? Yes Yes Yes Yes No  Comment - - - - -  Number falls in past yr: - 2 or more 2 or more 1 -  Injury with Fall? - (No Data) No No -  Comment - minor - - -  Risk for fall due to : - - - - -        Assessment & Plan:   1. Diabetic peripheral neuropathy associated with type 2 diabetes mellitus ([place])   2. 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 Degenerative disc disease, lumbar   4. Rheumatoid arthritis involving both hands with positive rheumatoid factor (H[facility])   5. Chronic pain syndrome   6. Reactive depression   7. Pure hypercholesterolemia   8. Elevated LFTs   9. Need for immunization against influenza   10. Dysuria    -persistent severe LEFT foot pain; s/p podiatry consultation; feels due to peripheral neuropathy; increase Cymbalta to 60mg  daily; will increase to 120mg  at next visit.  Decrease Lexapro to 1/2 daily until gone. -new onset dysuria; send urine culture. -recurrent and worsening swelling; rx for Lasix 20-40mg  daily PRN. -elevated LFTs at last visit; repeat today with hepatitis  panel. If persistently elevated, obtain abdominal US; most  consistent with NASH. -refill of hydrocodone provided for DDD lumbar spine.   Orders Placed This Encounter  Procedures  . Urine Culture  . Flu Vaccine QUAD 36+ mos IM  . CBC with Differential/Platelet  . Comprehensive metabolic panel    Order Specific Question:   Has the patient fasted?    Answer:   Yes  . Lipid panel    Order Specific Question:   Has the patient fasted?    Answer:   Yes  . Acute Hep Panel & Hep B Surface Ab  . Urinalysis, Routine w reflex microscopic  . POCT glycosylated hemoglobin (Hb A1C)   Meds ordered this encounter  Medications  . DISCONTD: furosemide (LASIX) 20 MG tablet    Sig: Take 1-2 tablets (20-40 mg total) by mouth daily.    Dispense:  180 tablet    Refill:  3    THANKS  . DULoxetine (CYMBALTA) 60 MG capsule    Sig: Take 1 capsule (60 mg total) by mouth daily.    Dispense:  90 capsule    Refill:  1  . DISCONTD: HYDROcodone-acetaminophen (NORCO) 10-325 MG tablet    Sig: Take 1 tablet by mouth every 6 (six) hours as needed.    Dispense:  120 tablet    Refill:  0  . simvastatin (ZOCOR) 40 MG tablet    Sig: Take 1 tablet (40 mg total) by mouth daily at 6 PM.    Dispense:  90 tablet    Refill:  1    To replace 20mg  Simvastatin; please delete further refills of 20mg .  . DISCONTD: HYDROcodone-acetaminophen (NORCO) 10-325 MG tablet    Sig: Take 1 tablet by mouth every 6 (six) hours as needed.    Dispense:  120 tablet    Refill:  0    Do not fill for 30 days after prescribed  . HYDROcodone-acetaminophen (NORCO) 10-325 MG tablet    Sig: Take 1 tablet by mouth every 6 (six) hours as needed.    Dispense:  120 tablet    Refill:  0    Do not fill for 60 days after prescribed  . allopurinol (ZYLOPRIM) 100 MG tablet    Sig: Take 1 tablet (100 mg total) by mouth daily. Ov needed    Dispense:  90 tablet    Refill:  3  . furosemide (LASIX) 20 MG tablet    Sig: Take 1-2 tablets (20-40 mg total)  by mouth daily.    Dispense:  180 tablet    Refill:  3    THANKS    Return in about 3 months (around 09/28/2017) for recheck.   Ludivina Guymon Elayne Guerin, M.D. Primary [facility] at [facility] [address] [place], [place]  [address] [phone] phone [phone] fax

## 2017-06-28 ENCOUNTER — Ambulatory Visit (INDEPENDENT_AMBULATORY_CARE_PROVIDER_SITE_OTHER): Payer: Medicare Other | Admitting: Family Medicine

## 2017-06-28 ENCOUNTER — Encounter: Payer: Self-pay | Admitting: Family Medicine

## 2017-06-28 VITALS — BP 142/74 | HR 99 | Temp 98.1°F | Resp 16 | Ht 62.21 in | Wt 223.0 lb

## 2017-06-28 DIAGNOSIS — I878 Other specified disorders of veins: Secondary | ICD-10-CM | POA: Diagnosis not present

## 2017-06-28 DIAGNOSIS — E1142 Type 2 diabetes mellitus with diabetic polyneuropathy: Secondary | ICD-10-CM | POA: Diagnosis not present

## 2017-06-28 DIAGNOSIS — Z23 Encounter for immunization: Secondary | ICD-10-CM | POA: Diagnosis not present

## 2017-06-28 DIAGNOSIS — R945 Abnormal results of liver function studies: Secondary | ICD-10-CM

## 2017-06-28 DIAGNOSIS — E113593 Type 2 diabetes mellitus with proliferative diabetic retinopathy without macular edema, bilateral: Secondary | ICD-10-CM | POA: Diagnosis not present

## 2017-06-28 DIAGNOSIS — F329 Major depressive disorder, single episode, unspecified: Secondary | ICD-10-CM | POA: Diagnosis not present

## 2017-06-28 DIAGNOSIS — R300 Dysuria: Secondary | ICD-10-CM | POA: Diagnosis not present

## 2017-06-28 DIAGNOSIS — M05742 Rheumatoid arthritis with rheumatoid factor of left hand without organ or systems involvement: Secondary | ICD-10-CM | POA: Diagnosis not present

## 2017-06-28 DIAGNOSIS — M5136 Other intervertebral disc degeneration, lumbar region: Secondary | ICD-10-CM | POA: Diagnosis not present

## 2017-06-28 DIAGNOSIS — G894 Chronic pain syndrome: Secondary | ICD-10-CM

## 2017-06-28 DIAGNOSIS — R7989 Other specified abnormal findings of blood chemistry: Secondary | ICD-10-CM | POA: Diagnosis not present

## 2017-06-28 DIAGNOSIS — E7800 Pure hypercholesterolemia, unspecified: Secondary | ICD-10-CM | POA: Diagnosis not present

## 2017-06-28 DIAGNOSIS — M05741 Rheumatoid arthritis with rheumatoid factor of right hand without organ or systems involvement: Secondary | ICD-10-CM | POA: Diagnosis not present

## 2017-06-28 LAB — POCT GLYCOSYLATED HEMOGLOBIN (HGB A1C): Hemoglobin A1C: 7

## 2017-06-28 MED ORDER — FUROSEMIDE 20 MG PO TABS: 20.0000 mg | ORAL_TABLET | Freq: Every day | ORAL | 3 refills | Status: DC

## 2017-06-28 MED ORDER — HYDROCODONE-ACETAMINOPHEN 10-325 MG PO TABS: 1.0000 | ORAL_TABLET | Freq: Four times a day (QID) | ORAL | 0 refills | Status: DC | PRN

## 2017-06-28 MED ORDER — SIMVASTATIN 40 MG PO TABS: 40.0000 mg | ORAL_TABLET | Freq: Every day | ORAL | 1 refills | Status: DC

## 2017-06-28 MED ORDER — DULOXETINE HCL 60 MG PO CPEP: 30.0000 mg | ORAL_CAPSULE | Freq: Every day | ORAL | 1 refills | Status: DC

## 2017-06-28 MED ORDER — ALLOPURINOL 100 MG PO TABS: 100.0000 mg | ORAL_TABLET | Freq: Every day | ORAL | 3 refills | Status: DC

## 2017-06-28 NOTE — Patient Instructions (Addendum)
  DECREASE LEXAPRO 20MG  TO 1/2 DAILY UNTIL GONE INCREASE CYMBALTA TO 60MG  ONE CAPSULE DAILY.   IF you received an x-ray today, you will receive an invoice from [facility]. Please contact [facility] at [phone] with questions or concerns regarding your invoice.   IF you received labwork today, you will receive an invoice from [place]. Please contact [facility] at [phone] with questions or concerns regarding your invoice.   Our billing staff will not be able to assist you with questions regarding bills from these companies.  You will be contacted with the lab results as soon as they are available. The fastest way to get your results is to activate your My Chart account. Instructions are located on the last page of this paperwork. If you have not heard from [name] regarding the results in 2 weeks, please contact this office.

## 2017-06-29 LAB — CBC WITH DIFFERENTIAL/PLATELET
BASOS: 1 %
Basophils Absolute: 0 10*3/uL (ref 0.0–0.2)
EOS (ABSOLUTE): 0.3 10*3/uL (ref 0.0–0.4)
EOS: 7 %
HEMOGLOBIN: 11.5 g/dL (ref 11.1–15.9)
Hematocrit: 35.2 % (ref 34.0–46.6)
IMMATURE GRANS (ABS): 0 10*3/uL (ref 0.0–0.1)
Immature Granulocytes: 0 %
LYMPHS: 41 %
Lymphocytes Absolute: 1.8 10*3/uL (ref 0.7–3.1)
MCH: 30.7 pg (ref 26.6–33.0)
MCHC: 32.7 g/dL (ref 31.5–35.7)
MCV: 94 fL (ref 79–97)
MONOCYTES: 7 %
Monocytes Absolute: 0.3 10*3/uL (ref 0.1–0.9)
NEUTROS ABS: 2 10*3/uL (ref 1.4–7.0)
Neutrophils: 44 %
Platelets: 233 10*3/uL (ref 150–379)
RBC: 3.75 x10E6/uL — ABNORMAL LOW (ref 3.77–5.28)
RDW: 13.7 % (ref 12.3–15.4)
WBC: 4.4 10*3/uL (ref 3.4–10.8)

## 2017-06-29 LAB — COMPREHENSIVE METABOLIC PANEL
A/G RATIO: 1.5 (ref 1.2–2.2)
ALT: 26 IU/L (ref 0–32)
AST: 31 IU/L (ref 0–40)
Albumin: 4.3 g/dL (ref 3.6–4.8)
Alkaline Phosphatase: 74 IU/L (ref 39–117)
BILIRUBIN TOTAL: 0.3 mg/dL (ref 0.0–1.2)
BUN / CREAT RATIO: 17 (ref 12–28)
BUN: 24 mg/dL (ref 8–27)
CALCIUM: 9.8 mg/dL (ref 8.7–10.3)
CHLORIDE: 102 mmol/L (ref 96–106)
CO2: 29 mmol/L (ref 20–29)
Creatinine, Ser: 1.4 mg/dL — ABNORMAL HIGH (ref 0.57–1.00)
GFR, EST AFRICAN AMERICAN: 45 mL/min/{1.73_m2} — AB (ref >59)
GFR, EST NON AFRICAN AMERICAN: 39 mL/min/{1.73_m2} — AB (ref >59)
Globulin, Total: 2.8 g/dL (ref 1.5–4.5)
Glucose: 60 mg/dL — ABNORMAL LOW (ref 65–99)
POTASSIUM: 4.7 mmol/L (ref 3.5–5.2)
Sodium: 143 mmol/L (ref 134–144)
TOTAL PROTEIN: 7.1 g/dL (ref 6.0–8.5)

## 2017-06-29 LAB — MICROSCOPIC EXAMINATION: Casts: NONE SEEN /lpf

## 2017-06-29 LAB — URINALYSIS, ROUTINE W REFLEX MICROSCOPIC
Bilirubin, UA: NEGATIVE
GLUCOSE, UA: NEGATIVE
KETONES UA: NEGATIVE
NITRITE UA: NEGATIVE
Protein, UA: NEGATIVE
RBC, UA: NEGATIVE
SPEC GRAV UA: 1.011 (ref 1.005–1.030)
Urobilinogen, Ur: 0.2 mg/dL (ref 0.2–1.0)
pH, UA: 5.5 (ref 5.0–7.5)

## 2017-06-29 LAB — LIPID PANEL
CHOL/HDL RATIO: 5 ratio — AB (ref 0.0–4.4)
Cholesterol, Total: 231 mg/dL — ABNORMAL HIGH (ref 100–199)
HDL: 46 mg/dL (ref >39)
LDL Calculated: 138 mg/dL — ABNORMAL HIGH (ref 0–99)
Triglycerides: 236 mg/dL — ABNORMAL HIGH (ref 0–149)
VLDL CHOLESTEROL CAL: 47 mg/dL — AB (ref 5–40)

## 2017-06-29 LAB — ACUTE HEP PANEL AND HEP B SURFACE AB
HEP A IGM: NEGATIVE
HEP B S AG: NEGATIVE
Hep B C IgM: NEGATIVE
Hepatitis B Surf Ab Quant: <3.1 m[IU]/mL — ABNORMAL LOW (ref >9.9)

## 2017-06-30 LAB — URINE CULTURE

## 2017-07-04 ENCOUNTER — Other Ambulatory Visit: Payer: Self-pay | Admitting: Family Medicine

## 2017-07-04 MED ORDER — DOXYCYCLINE HYCLATE 100 MG PO CAPS: 100.0000 mg | ORAL_CAPSULE | Freq: Two times a day (BID) | ORAL | 0 refills | Status: DC

## 2017-07-11 ENCOUNTER — Other Ambulatory Visit: Payer: Self-pay | Admitting: Gastroenterology

## 2017-07-11 DIAGNOSIS — R1310 Dysphagia, unspecified: Secondary | ICD-10-CM

## 2017-07-11 DIAGNOSIS — K2960 Other gastritis without bleeding: Secondary | ICD-10-CM | POA: Diagnosis not present

## 2017-07-11 DIAGNOSIS — R1312 Dysphagia, oropharyngeal phase: Secondary | ICD-10-CM | POA: Diagnosis not present

## 2017-07-12 ENCOUNTER — Telehealth: Payer: Self-pay | Admitting: Family Medicine

## 2017-07-12 DIAGNOSIS — F329 Major depressive disorder, single episode, unspecified: Secondary | ICD-10-CM

## 2017-07-12 DIAGNOSIS — M5136 Other intervertebral disc degeneration, lumbar region: Secondary | ICD-10-CM

## 2017-07-12 DIAGNOSIS — T17308D Unspecified foreign body in larynx causing other injury, subsequent encounter: Secondary | ICD-10-CM

## 2017-07-12 DIAGNOSIS — Z6837 Body mass index (BMI) 37.0-37.9, adult: Secondary | ICD-10-CM

## 2017-07-12 DIAGNOSIS — E1142 Type 2 diabetes mellitus with diabetic polyneuropathy: Secondary | ICD-10-CM

## 2017-07-12 DIAGNOSIS — M05741 Rheumatoid arthritis with rheumatoid factor of right hand without organ or systems involvement: Secondary | ICD-10-CM

## 2017-07-12 DIAGNOSIS — G894 Chronic pain syndrome: Secondary | ICD-10-CM

## 2017-07-12 DIAGNOSIS — M05742 Rheumatoid arthritis with rheumatoid factor of left hand without organ or systems involvement: Secondary | ICD-10-CM

## 2017-07-12 NOTE — Telephone Encounter (Signed)
Pt called requesting an order for a sleep apnea study. She said she has been seeing Dr. Therisa Doyne? about her swollowing problem and the doctor has requested this study be ordered. Best contact number for the pt is [phone]. Thanks!

## 2017-07-13 DIAGNOSIS — Z79899 Other long term (current) drug therapy: Secondary | ICD-10-CM | POA: Diagnosis not present

## 2017-07-13 DIAGNOSIS — M0609 Rheumatoid arthritis without rheumatoid factor, multiple sites: Secondary | ICD-10-CM | POA: Diagnosis not present

## 2017-07-14 ENCOUNTER — Other Ambulatory Visit: Payer: Medicare Other

## 2017-07-14 ENCOUNTER — Telehealth: Payer: Self-pay | Admitting: Family Medicine

## 2017-07-14 NOTE — Telephone Encounter (Signed)
[facility] is calling to ask that we send over the diagnosis codes for the levamir so they can bill pt part b insurance    Best number [phone]

## 2017-07-18 MED ORDER — INSULIN DETEMIR 100 UNIT/ML ~~LOC~~ SOLN: SUBCUTANEOUS | 1 refills | Status: DC

## 2017-07-18 NOTE — Telephone Encounter (Signed)
Referral for sleep consultation placed.  Pt should be contacted within upcoming two weeks with referral date. Please advise pt.

## 2017-07-18 NOTE — Telephone Encounter (Signed)
Resent Levemir rx to [facility] with ICD10 codes.

## 2017-07-19 NOTE — Telephone Encounter (Signed)
I advise pt that Referral for sleep consultation has been placed and that she will be contacted within upcoming two weeks with referral date. Pt states thank you and that that she will be waiting on the call

## 2017-07-20 ENCOUNTER — Ambulatory Visit
Admission: RE | Admit: 2017-07-20 | Discharge: 2017-07-20 | Disposition: A | Discharge status: Home / Self Care | Payer: Medicare Other | Source: Ambulatory Visit | Attending: Gastroenterology | Admitting: Gastroenterology

## 2017-07-20 DIAGNOSIS — R1310 Dysphagia, unspecified: Secondary | ICD-10-CM | POA: Diagnosis not present

## 2017-07-21 ENCOUNTER — Ambulatory Visit (INDEPENDENT_AMBULATORY_CARE_PROVIDER_SITE_OTHER): Payer: Medicare Other | Admitting: Neurology

## 2017-07-21 ENCOUNTER — Encounter: Payer: Self-pay | Admitting: Neurology

## 2017-07-21 VITALS — BP 152/83 | HR 100 | Ht 62.0 in | Wt 225.0 lb

## 2017-07-21 DIAGNOSIS — I10 Essential (primary) hypertension: Secondary | ICD-10-CM

## 2017-07-21 DIAGNOSIS — G4710 Hypersomnia, unspecified: Secondary | ICD-10-CM

## 2017-07-21 DIAGNOSIS — Z794 Long term (current) use of insulin: Secondary | ICD-10-CM

## 2017-07-21 DIAGNOSIS — G4730 Sleep apnea, unspecified: Secondary | ICD-10-CM

## 2017-07-21 DIAGNOSIS — E1142 Type 2 diabetes mellitus with diabetic polyneuropathy: Secondary | ICD-10-CM

## 2017-07-21 NOTE — Progress Notes (Signed)
[facility]   Provider:  Larey Seat, [place] D  Primary Care Physician:  Wardell Honour, MD   Referring Provider: Wardell Honour, MD    Chief Complaint  Patient presents with  . New Patient (Initial Visit)    pt with daughter, rm 60. pt has been informed that she stops breathing in her sleep and mild snoring.     HPI:  Angel French is a 66 y.o. female , seen here as in a referral/ revisit  from Dr. Tamala Julian for a sleep apnea evaluation.   Today's 07/21/2017 and I have the pleasure of seeing Angel French, was a new patient to my practice. Her daughter, was also present here today, reports that she has witnessed snoring and apnea as has Angel French bed partner. Two girlfriends who have used CPAP on trips noted her apnea and were surprised that she didn't have a CPAP.  The patient is not sure if she has apnea, but she has been notched many times to turn over or to start breathing again. She also is excessively daytime sleepy reflected in an Epworth sleepiness score of 13 points fatigue severity at 39 points and there is a mild clinical depression at 5/15 points on her geriatric depression index.  The patient has an extensive past medical history which is documented in epic through her primary care physician. Sleep is affected by arthritis, anxiety, she had a brachial plexus lesion, she has CKG stage III as reported by her, obesity, diabetes mellitus with neuropathy, angiopathy type II and retinopathy. She has significant visual impairment. She also has GERD, hyperlipidemia, hypertension. Foot pain-RA.  She is status post a tonsillectomy in childhood. Status post cataract surgery, cholecystectomy, cardiac catheterization in 2009 which was normal, abdominal surgery.   Sleep habits are as follows: The patient spends her late evening's reading or watching TV before going to bed, her bedroom is cool quiet and dark. She takes trazodone to help her fall asleep. Bedtime is usually around  10 PM. She is usually asleep within 30 minutes. The patient describes usually sleeping on her back. This may be when her snoring is the loudest. She wakes regularly at 2 AM to go to the bathroom, and again at 6 AM. Otherwise she can't sleep through. She often sleeps poorly, especially when there are appointments or activity scheduled for the following day. If she knows that the following day is up to herself she sleeps better. There is an anticipation -stress factor in this description of insomnia. She usually tries to get up by 8:30 AM. By her own estimate she sleeps 2 blocks of 3 hours only.   Sleep medical history and family sleep history: Insomnia begun at age 46 and has progressed since. The patient is widowed for 9 years. The patient was married for 35 years, has 2 adult children. The patient was adopted.  Social history: The patient is widowed since 2009 she has to adult children, 2 grandchildren and lives with her boyfriend and her own private home. She started smoking at age 35 ended up smoking 3 packs per day for many years and quit in 1980 She seldom drinks alcohol, caffeine intake is "enormous" by her own description. The patient averages 10 caffeinated beverages a day.  Review of Systems: Out of a complete 14 system review, the patient complains of only the following symptoms, and all other reviewed systems are negative.   Epworth score 13 , Fatigue severity score 39 , depression score 5  Social History   Social History  . Marital status: Married    Spouse name: Angel French  . Number of children: 2  . Years of education: college   Occupational History  . retired     retretied   Social History Main Topics  . Smoking status: Former Games developer  . Smokeless tobacco: Never Used     Comment: Quit 1987  . Alcohol use No  . Drug use: No  . Sexual activity: Yes    Birth control/ protection: Surgical, Post-menopausal     Comment: widow   Other Topics Concern  . Not on file     Social History Narrative   Marital status: widowed since 2009; dating x 6 years.  Happy; no abuse.      Children: 2 children (35 daughter, 22 son estranged); 2 grandchildren.      Lives: with boyfriend, daughter, granddaughter, friend of daughter.  Lives in pt house.      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      Tobacco:  Smoked x 20 years; quit 20 years.      Alcohol:  On special occasions; once per week on average.       Drugs:  None since college.      Exercise:  Walking several times per week; walks the dog.   Education college   Caffeine one cup daily.   Right handed      Advanced Directives: none; FULL CODE.  DNR/DNI.  HCPOA: Victorino Dike?               Family History  Problem Relation Age of Onset  . Adopted: Yes  . Family history unknown: Yes    Past Medical History:  Diagnosis Date  . Allergy    generic allergy pill; Spring and Fall only.  . Anxiety   . Arthritis    DDD lumbar, R hip OA.  s/p ortho consult in past.  . Blood transfusion without reported diagnosis    Mountain climbing accident in [place].  . Brachial plexus disorders   . Cataract    B retractions.  . Chronic kidney disease    stage 3 per pt.   . Depression   . Diabetes mellitus   . Diabetic peripheral neuropathy associated with type 2 diabetes mellitus (HCC)   . Diabetic retinopathy (HCC)   . Diabetic retinopathy associated with type 2 diabetes mellitus (HCC)    s/p laser treatment multiple.  Unable to drive.  Marland Kitchen GERD (gastroesophageal reflux disease)   . Hyperlipidemia   . Hypertension    controlled, off meds   . Neuromuscular disorder (HCC)   . Rheumatoid arthritis (HCC)   . Ulcer    Peptic ulcer H. Pylori + s/p treatment.  Upper GI diagnosed.[name] Prilosec PRN .    Past Surgical History:  Procedure Laterality Date  .  2 SPINAL INJECTIONS     . ABDOMINAL HYSTERECTOMY  11/02/1979   DUB; cervical dysplasia; ovaries  intact.  . ABDOMINAL SURGERY     staph abcess   . [facility] Admission     age 10; three months in [place].  Marland Kitchen CARDIAC CATHETERIZATION  11/02/2007   normal coronary arteries.  . CARPAL TUNNEL RELEASE     Bilateral.  . CATARACT EXTRACTION, BILATERAL    . CHOLECYSTECTOMY    . EYE SURGERY     Cataracts B. Laser surgery x 7 for Diabetic Retinopathy  .  TONSILLECTOMY      Current Outpatient Prescriptions  Medication Sig Dispense Refill  . allopurinol (ZYLOPRIM) 100 MG tablet Take 1 tablet (100 mg total) by mouth daily. Ov needed 90 tablet 3  . aspirin 325 MG tablet Take 325 mg by mouth daily.     . Blood Glucose Monitoring Suppl (BLOOD GLUCOSE METER KIT AND SUPPLIES) KIT Dispense based on patient and insurance preference. Use up to four times daily as directed. (FOR ICD-9 250.00, 250.01). 1 each 11  . diclofenac sodium (VOLTAREN) 1 % GEL Apply 2 g topically 4 (four) times daily. (Patient taking differently: Apply 2 g topically 2 (two) times daily as needed (pain). ) 100 g 3  . DULoxetine (CYMBALTA) 60 MG capsule Take 1 capsule (60 mg total) by mouth daily. 90 capsule 1  . escitalopram (LEXAPRO) 20 MG tablet TAKE 1 TABLET (20 MG TOTAL) BY MOUTH DAILY. 30 tablet 11  . furosemide (LASIX) 20 MG tablet Take 1-2 tablets (20-40 mg total) by mouth daily. 180 tablet 3  . glucose blood test strip Check sugar three times daily  Dx: DMII insulin dependent with retinopathy, neuropathy controlled 300 each 3  . HYDROcodone-acetaminophen (NORCO) 10-325 MG tablet Take 1 tablet by mouth every 6 (six) hours as needed. 120 tablet 0  . insulin detemir (LEVEMIR) 100 UNIT/ML injection USE 60 UNITS AS DIRECTED AT BEDTIME dx: E11.42, B76.2831 (Patient taking differently: USE 60 UNITS AS DIRECTED IN AM dx: E11.42, D17.6160) 20 mL 1  . insulin detemir (LEVEMIR) 100 UNIT/ML injection USE 60 UNITS AS DIRECTED AT BEDTIME dx: E11.42, V37.1062) (Patient taking differently: USE 40 UNITS AS DIRECTED AT BEDTIME dx:  E11.42, I94.8546)) 20 mL 1  . Insulin Syringes, Disposable, U-100 0.5 ML MISC 28 Units by Does not apply route 2 (two) times daily. 100 each 11  . ipratropium (ATROVENT) 0.03 % nasal spray Place 2 sprays into the nose 2 (two) times daily. 30 mL 11  . leflunomide (ARAVA) 20 MG tablet     . Needles & Syringes MISC 1 Syringe by Does not apply route 2 (two) times daily. 100 each 11  . NOVOLOG 100 UNIT/ML injection INJECT 15-20 UNITS SUBCUTANEOUSLY 3 TIMES DAILY PER SLIDING SCALE 10 mL 7  . nystatin cream (MYCOSTATIN) Apply 1 application topically 2 (two) times daily. 90 g 5  . omeprazole (PRILOSEC) 20 MG capsule Take 1 capsule (20 mg total) by mouth daily. 90 capsule 3  . oxybutynin (DITROPAN XL) 15 MG 24 hr tablet Take 1 tablet (15 mg total) by mouth at bedtime. 90 tablet 3  . predniSONE (DELTASONE) 5 MG tablet Take 5 mg by mouth daily with breakfast.    . simvastatin (ZOCOR) 40 MG tablet Take 1 tablet (40 mg total) by mouth daily at 6 PM. 90 tablet 1  . sucralfate (CARAFATE) 1 g tablet     . traZODone (DESYREL) 100 MG tablet TAKE 2 TABS BY MOUTH AT BEDTIME 180 tablet 2  . ULTICARE INSULIN SYRINGE 31G X 5/16" 0.5 ML MISC USE AS DIRECTED TO INJECT INSULIN 2 TIMES DAILY 100 each 4   No current facility-administered medications for this visit.     Allergies as of 07/21/2017 - Review Complete 07/21/2017  Allergen Reaction Noted  . Codeine Anaphylaxis 04/24/2014  . Contrast media [iodinated diagnostic agents] Anaphylaxis 10/03/2011  . Nitrofurantoin monohyd macro Anaphylaxis 10/03/2011  . Betadine [povidone iodine] Itching 04/13/2016  . Folic acid Itching 27/12/5007  . Gabapentin Other (See Comments) 08/12/2016  . Iodine Hives 10/03/2011  .  Lyrica [pregabalin] Other (See Comments) 08/12/2016  . Red dye Itching 01/01/2013  . Ultram [tramadol hcl] Nausea And Vomiting 10/03/2011    Vitals: BP (!) 152/83   Pulse 100   Ht _0  (1.575 m)   Wt 225 lb (102.1 kg)   BMI 41.15 kg/m  Last Weight:   Wt Readings from Last 1 Encounters:  07/21/17 225 lb (102.1 kg)   LKJ:ZPHX mass index is 41.15 kg/m.     Last Height:   Ht Readings from Last 1 Encounters:  07/21/17 _1  (1.575 m)    Physical exam:  General: The patient is awake, alert and appears not in acute distress. The patient is well groomed. Head: Normocephalic, atraumatic. Neck is supple. Mallampati 5,  neck circumference:17. Nasal airflow congested ,  Retrognathia is not seen.  Cardiovascular:  Regular rate and rhythm , without  murmurs or carotid bruit, and without distended neck veins. Respiratory: Lungs are clear to auscultation. Skin:  With evidence of ankle -edema, or rash Trunk: BMI is 41- The patient's posture is erect.  Neurologic exam : The patient is awake and alert, oriented to place and time.   Memory subjective described as intact.  Mood and affect are appropriate.  Cranial nerves:  Pupils are equal and briskly reactive to light.  Extraocular movements  in vertical and horizontal planes intact and without nystagmus. Visual fields by finger perimetry are intact. Hearing to finger rub intact.  Facial sensation intact to fine touch. Facial motor strength is symmetric and tongue and uvula move midline. Shoulder shrug was symmetrical.   Motor exam: Normal tone, muscle bulk and symmetric strength in all extremities. Sensory:  Fine touch, pinprick and vibration were tested in all extremities. Proprioception tested in the upper extremities was normal. Coordination: Rapid alternating movements in the fingers/hands was normal. Finger-to-nose maneuver  normal without evidence of ataxia, dysmetria or tremor. Gait and station: Patient walks without assistive device and is able unassisted to climb up to the exam table. Strength within normal limits. Stance is stable and normal.  Turns with 3 Steps. Romberg testing is negative. Deep tendon reflexes: in the  upper and lower extremities are symmetric and intact.     Assessment:  After physical and neurologic examination, review of laboratory studies,  Personal review of imaging studies, reports of other /same  Imaging studies, results of polysomnography and / or neurophysiology testing and pre-existing records as far as provided in visit., my assessment is   1) I think that Angel French has sleep apnea, based on the witness reports. She has multiple risk factors but even with out witness reports would place her in a high risk category. These comorbidities are metabolic syndrome, diabetes mellitus type 2, BMI, large Mallampati and neck circumference. Reported fragmented sleep, non-refreshing nonrestorative sleep and overall sleep deprivation which will contribute to excessive daytime sleepiness. I will order an attended split-night polysomnography for the patient. I will not add capnography and concentrate on treating hypoxemia should this be found.   The patient was advised of the nature of the diagnosed disorder , the treatment options and the  risks for general health and wellness arising from not treating the condition.   I spent more than 45 minutes of face to face time with the patient.  Greater than 50% of time was spent in counseling and coordination of care. We have discussed the diagnosis and differential and I answered the patient's questions.    Plan:  Treatment plan and additional workup : SPLIT  Consider referral to [facility], Dr. Inis Sizer, MD 9/79/1504, 13:64 AM  Certified in Neurology by ABPN Certified in [place] by [facility] [address], [place], [place] [address]

## 2017-08-02 ENCOUNTER — Ambulatory Visit (INDEPENDENT_AMBULATORY_CARE_PROVIDER_SITE_OTHER): Payer: Medicare Other | Admitting: Neurology

## 2017-08-02 DIAGNOSIS — G4736 Sleep related hypoventilation in conditions classified elsewhere: Secondary | ICD-10-CM

## 2017-08-02 DIAGNOSIS — G4710 Hypersomnia, unspecified: Secondary | ICD-10-CM

## 2017-08-02 DIAGNOSIS — Z794 Long term (current) use of insulin: Secondary | ICD-10-CM

## 2017-08-02 DIAGNOSIS — G4730 Sleep apnea, unspecified: Secondary | ICD-10-CM

## 2017-08-02 DIAGNOSIS — E1142 Type 2 diabetes mellitus with diabetic polyneuropathy: Secondary | ICD-10-CM

## 2017-08-02 DIAGNOSIS — G4733 Obstructive sleep apnea (adult) (pediatric): Secondary | ICD-10-CM

## 2017-08-02 DIAGNOSIS — E669 Obesity, unspecified: Secondary | ICD-10-CM

## 2017-08-02 DIAGNOSIS — I10 Essential (primary) hypertension: Secondary | ICD-10-CM

## 2017-08-05 ENCOUNTER — Emergency Department (HOSPITAL_COMMUNITY): Payer: Medicare Other

## 2017-08-05 ENCOUNTER — Encounter (HOSPITAL_COMMUNITY): Payer: Self-pay | Admitting: Emergency Medicine

## 2017-08-05 ENCOUNTER — Emergency Department (HOSPITAL_COMMUNITY)
Admission: EM | Admit: 2017-08-05 | Discharge: 2017-08-05 | Disposition: A | Discharge status: Home / Self Care | Payer: Medicare Other | Attending: Emergency Medicine | Admitting: Emergency Medicine

## 2017-08-05 DIAGNOSIS — Y9301 Activity, walking, marching and hiking: Secondary | ICD-10-CM | POA: Diagnosis not present

## 2017-08-05 DIAGNOSIS — M25532 Pain in left wrist: Secondary | ICD-10-CM | POA: Insufficient documentation

## 2017-08-05 DIAGNOSIS — S92252A Displaced fracture of navicular [scaphoid] of left foot, initial encounter for closed fracture: Secondary | ICD-10-CM

## 2017-08-05 DIAGNOSIS — M25512 Pain in left shoulder: Secondary | ICD-10-CM | POA: Diagnosis not present

## 2017-08-05 DIAGNOSIS — S92512A Displaced fracture of proximal phalanx of left lesser toe(s), initial encounter for closed fracture: Secondary | ICD-10-CM | POA: Diagnosis not present

## 2017-08-05 DIAGNOSIS — S92502B Displaced unspecified fracture of left lesser toe(s), initial encounter for open fracture: Secondary | ICD-10-CM

## 2017-08-05 DIAGNOSIS — S93402A Sprain of unspecified ligament of left ankle, initial encounter: Secondary | ICD-10-CM

## 2017-08-05 DIAGNOSIS — M25572 Pain in left ankle and joints of left foot: Secondary | ICD-10-CM | POA: Diagnosis not present

## 2017-08-05 DIAGNOSIS — W0110XA Fall on same level from slipping, tripping and stumbling with subsequent striking against unspecified object, initial encounter: Secondary | ICD-10-CM | POA: Diagnosis not present

## 2017-08-05 DIAGNOSIS — S93115A Dislocation of interphalangeal joint of left lesser toe(s), initial encounter: Secondary | ICD-10-CM | POA: Diagnosis not present

## 2017-08-05 DIAGNOSIS — S92522B Displaced fracture of medial phalanx of left lesser toe(s), initial encounter for open fracture: Secondary | ICD-10-CM | POA: Diagnosis not present

## 2017-08-05 DIAGNOSIS — Y999 Unspecified external cause status: Secondary | ICD-10-CM | POA: Diagnosis not present

## 2017-08-05 DIAGNOSIS — M25522 Pain in left elbow: Secondary | ICD-10-CM | POA: Insufficient documentation

## 2017-08-05 DIAGNOSIS — S92912A Unspecified fracture of left toe(s), initial encounter for closed fracture: Secondary | ICD-10-CM

## 2017-08-05 DIAGNOSIS — R0781 Pleurodynia: Secondary | ICD-10-CM | POA: Diagnosis not present

## 2017-08-05 DIAGNOSIS — R0789 Other chest pain: Secondary | ICD-10-CM | POA: Diagnosis present

## 2017-08-05 MED ORDER — LIDOCAINE HCL (PF) 2 % IJ SOLN
10.0000 mL | Freq: Once | INTRAMUSCULAR | Status: AC
  Administered 2017-08-05: 10 mL via INTRADERMAL

## 2017-08-05 MED ORDER — HYDROMORPHONE HCL 1 MG/ML IJ SOLN
0.5000 mg | Freq: Once | INTRAMUSCULAR | Status: AC
  Administered 2017-08-05: 0.5 mg via INTRAMUSCULAR
  Filled 2017-08-05: qty 1

## 2017-08-05 MED ORDER — LIDOCAINE HCL (PF) 2 % IJ SOLN
INTRAMUSCULAR | Status: AC
  Administered 2017-08-05: 10 mL via INTRADERMAL
  Filled 2017-08-05: qty 10

## 2017-08-05 MED ORDER — CEPHALEXIN 500 MG PO CAPS: 500.0000 mg | ORAL_CAPSULE | Freq: Three times a day (TID) | ORAL | 0 refills | Status: DC

## 2017-08-05 MED ORDER — CEPHALEXIN 500 MG PO CAPS
500.0000 mg | ORAL_CAPSULE | Freq: Once | ORAL | Status: AC
  Administered 2017-08-05: 500 mg via ORAL
  Filled 2017-08-05: qty 1

## 2017-08-05 NOTE — ED Triage Notes (Signed)
Patient c/o slip and fall in her house today. C/o left foot pain. Denies head injury and LOC.

## 2017-08-05 NOTE — Discharge Instructions (Signed)
You have been evaluated for your injuries.  You have multiples toes fracture and likely left ankle sprain.  Please wear cam walker, use crutches, continue taking your home pain medication and follow up closely with orthopedist next week for further care.  Take antibiotic as prescribed.  Return if you have any concerns.

## 2017-08-05 NOTE — Procedures (Signed)
PSG was performed as SPLIT night criteria were not reached.  PATIENT'S NAME:  Angel French, Angel French DOB:      12/03/1950      MR#:    [id number]     DATE OF RECORDING: 08/02/2017 REFERRING M.D.:  Reginia Forts, MD Study Performed:   Baseline Polysomnogram HISTORY:   Mrs. Jim Like daughter reports that she witnessed her mother's snoring and apnea as have two girlfriends (who use CPAP) and have recently shared a hotel room with the patient. They were surprised that she didn't have a CPAP. The patient is not sure if she has apnea, but she has been notched many times to turn over or to start breathing again. She also is excessively daytime sleepy reflected in an Epworth sleepiness score of 13 points fatigue severity at 39 points and there is a mild clinical depression at 5/15 points on her geriatric depression index.   The patient has an extensive medical problem list. Sleep is affected by arthritis, anxiety, she had a brachial plexus lesion, she has CKD stage III as reported by her, obesity, diabetes mellitus with neuropathy, and retinopathy. She has significant visual impairment. She also has GERD, hyperlipidemia, hypertension.   The patient endorsed the Epworth Sleepiness Scale at 13/24 points.  The patient's weight 225 pounds with a height of 62 (inches), resulting in a BMI of 41.4 kg/m2.The patient's neck circumference measured 17 inches.  CURRENT MEDICATIONS: Allopurinol, Aspirin, Duloxetine, Escitalopram, Furosemide, Hydrocodone, Insulin, Ipratropium, Leflunomide, Novolog, Omeprazole, Oxybutynin, Prednisone, Simvastatin, Sucralfate and Trazodone.   PROCEDURE:  This is a multichannel digital polysomnogram utilizing the Somnostar 11.2 system.  Electrodes and sensors were applied and monitored per AASM Specifications.   EEG, EOG, Chin and Limb EMG, were sampled at 200 Hz.  ECG, Snore and Nasal Pressure, Thermal Airflow, Respiratory Effort, CPAP Flow and Pressure, Oximetry was sampled at 50 Hz. Digital video  and audio were recorded.      BASELINE STUDY  Lights Out was at 22:19 and Lights On at 05:02.  Total recording time (TRT) was 403.5 minutes, with a total sleep time (TST) of 365 minutes.   The patient's sleep latency was 40.5 minutes.  REM latency was 189 minutes.  The sleep efficiency was 90.5 %.     SLEEP ARCHITECTURE: WASO (Wake after sleep onset) was 6.5 minutes.  There were 8 minutes in Stage N1, 254.5 minutes Stage N2, 52.5 minutes Stage N3 and 50 minutes in Stage REM.  The percentage of Stage N1 was 2.2%, Stage N2 was 69.7%, Stage N3 was 14.4% and Stage R (REM sleep) was 13.7%.   RESPIRATORY ANALYSIS:  There were a total of 89 respiratory events:  0 obstructive apneas, 0 central apneas and 0 mixed apneas with a total of 0 apneas and an apnea index (AI) of 0 /hour. There were 89 hypopneas with a hypopnea index of 14.6 /hour. The patient also had 0 respiratory event related arousals (RERAs).      The total APNEA/HYPOPNEA INDEX (AHI) was 14.6/hour and the total RESPIRATORY DISTURBANCE INDEX was 14.6 /hour.  28 events occurred in REM sleep and 122 events in NREM. The REM AHI was 33.6 /hour, versus a non-REM AHI of 11.6. The patient spent 365 minutes of total sleep time in the supine position and 0 minutes in non-supine. The supine AHI was 14.6/hr. versus a non-supine AHI of 0.0.  OXYGEN SATURATION & C02:  The Wake baseline 02 saturation was 96%, with the lowest being 78%. Time spent below 89% saturation equaled 99  minutes.   PERIODIC LIMB MOVEMENTS:  The patient had a total of 0 Periodic Limb Movements. The arousals were noted as: 4 were spontaneous, 0 were associated with PLMs, and 89 were associated with respiratory events. Audio and video analysis did not show any abnormal or unusual movements, behaviors, phonations or vocalizations.   Mild snoring was noted. EKG was in keeping with normal sinus rhythm (NSR).  IMPRESSION:  1. Obstructive Sleep Apnea (OSA), mild degree at AHI of 14.6 but  exacerbated in REM to an AHI of 33.6 /hr.  2. Severe and prolonged hypoxia- 99 minutes total 02 desaturation time, nadir SPO2 of 78%.  RECOMMENDATIONS:  1. Advise full-night, attended, CPAP titration study to optimize therapy. This degree and form of apnea is not subject to dental device therapy. Patient may need oxygen supplementation at night.  2. Positional therapy to avoid supine sleep is advised. 3. Avoid sedative-hypnotics which may worsen sleep apnea (as applicable). 4. Advise to lose weight, by diet and exercise if not contraindicated (BMI 41.4). 5. Further information regarding OSA may be obtained from [employer] (www.sleepfoundation.org) or American Sleep Apnea Association (www.sleepapnea.org). 6. A follow up appointment will be scheduled in the Sleep Clinic at [facility]. The referring provider will be notified of the results.      I certify that I have reviewed the entire raw data recording prior to the issuance of this report in accordance with the Standards of Accreditation of the American Academy of Sleep Medicine (AASM)    Larey Seat, MD   08-05-2017 Diplomat, American Board of Psychiatry and Neurology  Diplomat, American Board of Sleep Medicine Medical Director of [employer] [employer], [employer] [facility]

## 2017-08-05 NOTE — ED Notes (Signed)
Patient reports her daughter will be driving her home.

## 2017-08-05 NOTE — ED Provider Notes (Signed)
[place] [facility] DEPT Provider Note   CSN: [id number] Arrival date & time: 08/05/17  2108     History   Chief Complaint Chief Complaint  Patient presents with  . Fall  . Foot Pain    HPI Angel French is a 66 y.o. female.  HPI   66 year old female with history of diabetes, osteoarthritis, rheumatoid arthritis presenting for evaluation of left foot injury. Patient reports she was walking barefoot at home earlier tonight when she slipped, fell backward striking her head and right shoulder against the ground and struck her feet against the ground. She report acute onset of pain to that top of her left foot. Pain initially intense, sharp, radiates towards her ankle. Pain improves with rest. She denies any significant tenderness to the back for head of her shoulder. She denies any precipitating symptoms prior to the fall. She is up-to-date with tetanus. No history of osteoporosis. Patient does take chronic pain medication.  Past Medical History:  Diagnosis Date  . Allergy    generic allergy pill; [place] [facility] [place] only.  . Anxiety   . Arthritis    DDD lumbar, R hip OA.  s/p ortho consult in past.  . Blood transfusion without reported diagnosis    Mountain climbing accident in [place].  . Brachial plexus disorders   . Cataract    B retractions.  . Chronic kidney disease    stage 3 per pt.   . Depression   . Diabetes mellitus   . Diabetic peripheral neuropathy associated with type 2 diabetes mellitus ([place])   . Diabetic retinopathy ([place])   . Diabetic retinopathy associated with type 2 diabetes mellitus ([place])    s/p laser treatment multiple.  Unable to drive.  Marland Kitchen GERD (gastroesophageal reflux disease)   . Hyperlipidemia   . Hypertension    controlled, off meds   . Neuromuscular disorder ([place])   . Rheumatoid arthritis ([place])   . Ulcer    Peptic ulcer H. Pylori + s/p treatment.  Upper GI diagnosed.[name] Prilosec PRN .    Patient Active Problem List   Diagnosis Date Noted  .  Hypersomnia with sleep apnea 07/21/2017  . Elevated blood uric acid level 01/25/2017  . Plantar fasciitis of left foot 01/25/2017  . Venous stasis 08/23/2016  . Rheumatoid arthritis involving both hands with positive rheumatoid factor ([place]) 10/30/2015  . Chronic pain syndrome 01/22/2015  . Chronic renal insufficiency 01/22/2015  . Pure hypercholesterolemia 01/01/2013  . Essential hypertension, benign 01/01/2013  . Degenerative disc disease, lumbar 01/01/2013  . Depression 01/01/2013  . Osteoarthritis of right hip 01/01/2013  . Diabetic peripheral neuropathy associated with type 2 diabetes mellitus ([place]) 01/01/2013  . Diabetic retinopathy ([place]) 01/01/2013  . Obesity 01/01/2013    Past Surgical History:  Procedure Laterality Date  .  2 SPINAL INJECTIONS     . ABDOMINAL HYSTERECTOMY  11/02/1979   DUB; cervical dysplasia; ovaries intact.  . ABDOMINAL SURGERY     staph abcess   . [facility] Admission     age 57; three months in [place].  Marland Kitchen CARDIAC CATHETERIZATION  11/02/2007   normal coronary arteries.  . CARPAL TUNNEL RELEASE     Bilateral.  . CATARACT EXTRACTION, BILATERAL    . CHOLECYSTECTOMY    . EYE SURGERY     Cataracts B. Laser surgery x 7 for Diabetic Retinopathy  . TONSILLECTOMY      OB History    No data available       Home  Medications    Prior to Admission medications   Medication Sig Start Date End Date Taking? Authorizing Provider  allopurinol (ZYLOPRIM) 100 MG tablet Take 1 tablet (100 mg total) by mouth daily. Ov needed 06/28/17   Wardell Honour, MD  aspirin 325 MG tablet Take 325 mg by mouth daily.     [provider]  Blood Glucose Monitoring Suppl (BLOOD GLUCOSE METER KIT AND SUPPLIES) KIT Dispense based on patient and insurance preference. Use up to four times daily as directed. (FOR ICD-9 250.00, 250.01). 07/19/14   Gale Journey, Damaris Hippo, PA-C  diclofenac sodium (VOLTAREN) 1 % GEL Apply 2 g topically 4 (four) times daily. Patient taking  differently: Apply 2 g topically 2 (two) times daily as needed (pain).  11/05/14   Wardell Honour, MD  DULoxetine (CYMBALTA) 60 MG capsule Take 1 capsule (60 mg total) by mouth daily. 06/28/17   Wardell Honour, MD  escitalopram (LEXAPRO) 20 MG tablet TAKE 1 TABLET (20 MG TOTAL) BY MOUTH DAILY. 03/24/17   Wardell Honour, MD  furosemide (LASIX) 20 MG tablet Take 1-2 tablets (20-40 mg total) by mouth daily. 06/28/17   Wardell Honour, MD  glucose blood test strip Check sugar three times daily  Dx: DMII insulin dependent with retinopathy, neuropathy controlled 05/03/17   Wardell Honour, MD  HYDROcodone-acetaminophen [facility]) 10-325 MG tablet Take 1 tablet by mouth every 6 (six) hours as needed. 06/28/17   Wardell Honour, MD  insulin detemir (LEVEMIR) 100 UNIT/ML injection USE 60 UNITS AS DIRECTED AT BEDTIME dx: E11.42, W58.0998 Patient taking differently: USE 60 UNITS AS DIRECTED IN AM dx: E11.42, P38.2505 07/18/17   Wardell Honour, MD  insulin detemir (LEVEMIR) 100 UNIT/ML injection USE 60 UNITS AS DIRECTED AT BEDTIME dx: E11.42, L97.6734) Patient taking differently: USE 40 UNITS AS DIRECTED AT BEDTIME dx: E11.42, L93.7902) 07/18/17   Wardell Honour, MD  Insulin Syringes, Disposable, U-100 0.5 ML MISC 28 Units by Does not apply route 2 (two) times daily. 10/30/14   Wardell Honour, MD  ipratropium (ATROVENT) 0.03 % nasal spray Place 2 sprays into the nose 2 (two) times daily. 08/12/16   Wardell Honour, MD  leflunomide Jolee Ewing) 20 MG tablet  12/10/16   [provider]  Needles & Syringes MISC 1 Syringe by Does not apply route 2 (two) times daily. 12/29/14   Wardell Honour, MD  NOVOLOG 100 UNIT/ML injection INJECT 15-20 UNITS SUBCUTANEOUSLY 3 TIMES DAILY PER SLIDING SCALE 02/02/17   Wardell Honour, MD  nystatin cream (MYCOSTATIN) Apply 1 application topically 2 (two) times daily. 11/04/15   Wardell Honour, MD  omeprazole (PRILOSEC) 20 MG capsule Take 1 capsule (20 mg total) by mouth daily. 03/11/17    Wardell Honour, MD  oxybutynin (DITROPAN XL) 15 MG 24 hr tablet Take 1 tablet (15 mg total) by mouth at bedtime. 03/23/17   Wardell Honour, MD  predniSONE (DELTASONE) 5 MG tablet Take 5 mg by mouth daily with breakfast.    [provider]  simvastatin (ZOCOR) 40 MG tablet Take 1 tablet (40 mg total) by mouth daily at 6 PM. 06/28/17   Wardell Honour, MD  sucralfate (CARAFATE) 1 g tablet  07/14/17   [provider]  traZODone (DESYREL) 100 MG tablet TAKE 2 TABS BY MOUTH AT BEDTIME 02/02/17   Wardell Honour, MD  Flossie Buffy INSULIN SYRINGE 31G X 5/16" 0.5 ML MISC USE AS DIRECTED TO INJECT INSULIN 2 TIMES DAILY  04/09/17   Wardell Honour, MD    Family History Family History  Problem Relation Age of Onset  . Adopted: Yes  . Family history unknown: Yes    Social History Social History  Substance Use Topics  . Smoking status: Former Research scientist (life sciences)  . Smokeless tobacco: Never Used     Comment: Quit 1987  . Alcohol use No     Allergies   Codeine; Contrast media [iodinated diagnostic agents]; Nitrofurantoin monohyd macro; Betadine [povidone iodine]; Folic acid; Gabapentin; Iodine; Lyrica [pregabalin]; Red dye; and Ultram [tramadol hcl]   Review of Systems Review of Systems  All other systems reviewed and are negative.    Physical Exam Updated Vital Signs BP (!) 183/79 (BP Location: Left Arm)   Pulse 100   Temp 98.3 F (36.8 C) (Oral)   Resp 18   Ht _0  (1.575 m)   Wt 99.8 kg (220 lb)   SpO2 97%   BMI 40.24 kg/m   Physical Exam  Constitutional: She appears well-developed and well-nourished. No distress.  Obese female sitting in chair in no acute discomfort.  HENT:  Head: Atraumatic.  No significant scalp tenderness  Eyes: Conjunctivae are normal.  Neck: Neck supple.  No cervical spine tenderness  Musculoskeletal: She exhibits tenderness (Left ankle: Edema noted to lateral malleolus region, with tenderness. No deformity noted.).  Left foot: Exquisite tenderness  noted throughout all toes with a small laceration to the base of the third toe with surrounding dry blood. Cap refill noted to each toes, no nail involvement. Tenderness to the dorsum of midfoot. Pedal pulse palpable.  Neurological: She is alert.  Skin: No rash noted.  Psychiatric: She has a normal mood and affect.  Nursing note and vitals reviewed.    ED Treatments / Results  Labs (all labs ordered are listed, but only abnormal results are displayed) Labs Reviewed - No data to display  EKG  EKG Interpretation None       Radiology Dg Foot Complete Left  Result Date: 08/05/2017 CLINICAL DATA:  66 y/o F; status post fall with pain across the toes radiating to the ankle. EXAM: LEFT FOOT - COMPLETE 3+ VIEW COMPARISON:  12/21/2016 left foot radiographs. FINDINGS: Minimally displaced acute fracture of the medial base of second middle phalanx. Minimally displaced acute fractures of the medial base of the third middle phalanx and head of the third proximal phalanx with intra-articular extension. Acute transverse fracture of the fourth middle phalanx distal diaphysis with 1/2 shaft's width lateral displacement of the head relative to the shaft. Minimally displaced acute fracture of the fifth middle phalanx medial base. Mildly displaced and angulated fracture of the fifth proximal phalanx diaphysis. Minimally displaced acute fracture of the dorsum of the navicular bone. No other fracture or dislocation identified. Lisfranc alignment is maintained. Small dorsal and plantar calcaneal enthesophytes. IMPRESSION: 1. Minimally displaced acute fracture of the medial base of second middle phalanx. 2. Minimally displaced acute fractures of the medial base of the third middle phalanx and head of the third proximal phalanx with intra-articular extension. 3. Acute transverse fracture of the fourth middle phalanx distal diaphysis with 1/2 shaft's width lateral displacement of the head relative to the shaft. 4.  Minimally displaced acute fracture of the fifth middle phalanx medial base. 5. Mildly displaced and angulated fracture of the fifth proximal phalanx diaphysis. 6. Minimally displaced acute fracture of the dorsum of the navicular bone. Electronically Signed   By: Kristine Garbe M.D.   On: 08/05/2017 21:35  Procedures Reduction of fracture Date/Time: 08/05/2017 11:25 PM Performed by: Domenic Moras Authorized by: Domenic Moras  Consent: Verbal consent obtained. Risks and benefits: risks, benefits and alternatives were discussed Consent given by: patient Patient understanding: patient states understanding of the procedure being performed Patient consent: the patient's understanding of the procedure matches consent given Procedure consent: procedure consent matches procedure scheduled Imaging studies: imaging studies available Patient identity confirmed: verbally with patient and arm band Time out: Immediately prior to procedure a "time out" was called to verify the correct patient, procedure, equipment, support staff and site/side marked as required. Preparation: Patient was prepped and draped in the usual sterile fashion. Local anesthesia used: yes  Anesthesia: Local anesthesia used: yes Local Anesthetic: lidocaine 2% without epinephrine Anesthetic total: 4 mL  Sedation: Patient sedated: no Patient tolerance: Patient tolerated the procedure well with no immediate complications Comments: L foot: 4th toe has a closed fracture dislocation.  Digital nerve block performed, and toe digit were relocated using direct pressure with traction/counter traction and manipulation.  Pt tolerates well.    (including critical care time)  Medications Ordered in ED Medications  lidocaine (XYLOCAINE) 2 % injection 10 mL (10 mLs Intradermal Given by Other 08/05/17 2258)  cephALEXin (KEFLEX) capsule 500 mg (500 mg Oral Given 08/05/17 2257)  HYDROmorphone (DILAUDID) injection 0.5 mg (0.5 mg  Intramuscular Given 08/05/17 2257)     Initial Impression / Assessment and Plan / ED Course  I have reviewed the triage vital signs and the nursing notes.  Pertinent labs & imaging results that were available during my care of the patient were reviewed by me and considered in my medical decision making (see chart for details).     BP (!) 183/79 (BP Location: Left Arm)   Pulse 100   Temp 98.3 F (36.8 C) (Oral)   Resp 18   Ht '5\' 2"'$  (1.575 m)   Wt 99.8 kg (220 lb)   SpO2 97%   BMI 40.24 kg/m  The patient was noted to be hypertensive today in the emergency department. I have spoken with the patient regarding hypertension and the need for improved management. I instructed the patient to followup with the Primary care doctor within 4 days to improve the management of the patient's hypertension. I also counseled the patient regarding the signs and symptoms which would require an emergent visit to an emergency department for hypertensive urgency and/or hypertensive emergency. The patient understood the need for improved hypertensive management.   Final Clinical Impressions(s) / ED Diagnoses   Final diagnoses:  Closed displaced fracture of navicular bone of left foot, initial encounter  Displaced unspecified fracture of left lesser toe(s), initial encounter for open fracture  Closed fracture dislocation of interphalangeal joint of multiple toes of left foot, initial encounter  Moderate left ankle sprain, initial encounter    New Prescriptions New Prescriptions   CEPHALEXIN (KEFLEX) 500 MG CAPSULE    Take 1 capsule (500 mg total) by mouth 3 (three) times daily.   10:52 PM Patient here for evaluation of a mechanical fall. She injured her left ankle and foot from the fall. X-ray of her left foot demonstrate minimally displaced acute fracture of the medial base of the second middle phalanx, minimally displaced acute fracture of the medial base of the third middle phalanx and the head of the  third proximal phalanx with intra-articular extension, acute transverse fracture of the fourth middle phalanx distal diaphysis, minimal displaced acute fracture of the fifth middle phalanx medial base, mildly displaced  and angulated fracture of the fifth proximal phalanx diaphysis, minimally displaced acute fractures of the dorsums of the navicular bone.  I discussed this finding with the patient and with Dr. Dolly Rias. Plan to perform digital block on the fourth toe to relocate the toe. Patient will be placed in a Cam Walker and crutches. She will need to follow-up probably with orthopedist for further management. Since this is an open fracture, Keflex given.    11:28 PM I have performed digital block to left fourth toe and were able to reduced the fracture dislocation.  On the third left toe I digital block and performed extensive irrigation.  Laceration were not repaired due to the location.  Dressing applied.  Pt place in cam walker and crutches.  She will f/u closely with orthopedist next week for further care.    Domenic Moras, PA-C 08/05/17 2333    Mesner, Corene Cornea, MD 08/06/17 9983    Merrily Pew, MD 08/06/17 3825    Merrily Pew, MD 08/06/17 2330

## 2017-08-05 NOTE — Addendum Note (Signed)
Addended by: Larey Seat on: 08/05/2017 04:05 PM   Modules accepted: Orders

## 2017-08-08 ENCOUNTER — Telehealth: Payer: Self-pay | Admitting: Neurology

## 2017-08-08 NOTE — Telephone Encounter (Signed)
Pt returned call. I advised pt that Dr. Brett Fairy reviewed their sleep study results and found that pt has OSA. Dr. Brett Fairy recommends that pt come in for a titration study. Pt verbalized understanding of results. Pt had no questions at this time but was encouraged to call back if questions arise.

## 2017-08-08 NOTE — Telephone Encounter (Signed)
-----   Message from Larey Seat, MD sent at 08/05/2017  4:05 PM EDT ----- OSA with prolonged and severe hypoxemia. Needs CPAP as explained above - weight loss will help, dental device will not.  Ordered CPAP titration

## 2017-08-08 NOTE — Telephone Encounter (Signed)
Called patient to discuss sleep study results. LVM for patient to return call.

## 2017-08-09 DIAGNOSIS — M25572 Pain in left ankle and joints of left foot: Secondary | ICD-10-CM | POA: Diagnosis not present

## 2017-08-11 DIAGNOSIS — S91312D Laceration without foreign body, left foot, subsequent encounter: Secondary | ICD-10-CM | POA: Diagnosis not present

## 2017-08-11 DIAGNOSIS — E1151 Type 2 diabetes mellitus with diabetic peripheral angiopathy without gangrene: Secondary | ICD-10-CM | POA: Diagnosis not present

## 2017-08-11 DIAGNOSIS — M0600 Rheumatoid arthritis without rheumatoid factor, unspecified site: Secondary | ICD-10-CM | POA: Diagnosis not present

## 2017-08-11 DIAGNOSIS — S92255D Nondisplaced fracture of navicular [scaphoid] of left foot, subsequent encounter for fracture with routine healing: Secondary | ICD-10-CM | POA: Diagnosis not present

## 2017-08-13 DIAGNOSIS — E1151 Type 2 diabetes mellitus with diabetic peripheral angiopathy without gangrene: Secondary | ICD-10-CM | POA: Diagnosis not present

## 2017-08-13 DIAGNOSIS — S91312D Laceration without foreign body, left foot, subsequent encounter: Secondary | ICD-10-CM | POA: Diagnosis not present

## 2017-08-13 DIAGNOSIS — M0600 Rheumatoid arthritis without rheumatoid factor, unspecified site: Secondary | ICD-10-CM | POA: Diagnosis not present

## 2017-08-13 DIAGNOSIS — S92255D Nondisplaced fracture of navicular [scaphoid] of left foot, subsequent encounter for fracture with routine healing: Secondary | ICD-10-CM | POA: Diagnosis not present

## 2017-08-15 DIAGNOSIS — S92255D Nondisplaced fracture of navicular [scaphoid] of left foot, subsequent encounter for fracture with routine healing: Secondary | ICD-10-CM | POA: Diagnosis not present

## 2017-08-15 DIAGNOSIS — S91312D Laceration without foreign body, left foot, subsequent encounter: Secondary | ICD-10-CM | POA: Diagnosis not present

## 2017-08-15 DIAGNOSIS — E1151 Type 2 diabetes mellitus with diabetic peripheral angiopathy without gangrene: Secondary | ICD-10-CM | POA: Diagnosis not present

## 2017-08-15 DIAGNOSIS — M0600 Rheumatoid arthritis without rheumatoid factor, unspecified site: Secondary | ICD-10-CM | POA: Diagnosis not present

## 2017-08-16 DIAGNOSIS — M25572 Pain in left ankle and joints of left foot: Secondary | ICD-10-CM | POA: Diagnosis not present

## 2017-08-23 DIAGNOSIS — R682 Dry mouth, unspecified: Secondary | ICD-10-CM | POA: Diagnosis not present

## 2017-08-23 DIAGNOSIS — M2550 Pain in unspecified joint: Secondary | ICD-10-CM | POA: Diagnosis not present

## 2017-08-23 DIAGNOSIS — Z79899 Other long term (current) drug therapy: Secondary | ICD-10-CM | POA: Diagnosis not present

## 2017-08-23 DIAGNOSIS — M0609 Rheumatoid arthritis without rheumatoid factor, multiple sites: Secondary | ICD-10-CM | POA: Diagnosis not present

## 2017-08-23 DIAGNOSIS — M5136 Other intervertebral disc degeneration, lumbar region: Secondary | ICD-10-CM | POA: Diagnosis not present

## 2017-08-23 DIAGNOSIS — E669 Obesity, unspecified: Secondary | ICD-10-CM | POA: Diagnosis not present

## 2017-08-23 DIAGNOSIS — Z6838 Body mass index (BMI) 38.0-38.9, adult: Secondary | ICD-10-CM | POA: Diagnosis not present

## 2017-08-23 DIAGNOSIS — M109 Gout, unspecified: Secondary | ICD-10-CM | POA: Diagnosis not present

## 2017-08-25 DIAGNOSIS — M0600 Rheumatoid arthritis without rheumatoid factor, unspecified site: Secondary | ICD-10-CM | POA: Diagnosis not present

## 2017-08-25 DIAGNOSIS — D631 Anemia in chronic kidney disease: Secondary | ICD-10-CM | POA: Diagnosis not present

## 2017-08-25 DIAGNOSIS — N183 Chronic kidney disease, stage 3 (moderate): Secondary | ICD-10-CM | POA: Diagnosis not present

## 2017-08-25 DIAGNOSIS — I129 Hypertensive chronic kidney disease with stage 1 through stage 4 chronic kidney disease, or unspecified chronic kidney disease: Secondary | ICD-10-CM | POA: Diagnosis not present

## 2017-08-25 DIAGNOSIS — E119 Type 2 diabetes mellitus without complications: Secondary | ICD-10-CM | POA: Diagnosis not present

## 2017-08-29 ENCOUNTER — Other Ambulatory Visit: Payer: Self-pay | Admitting: Gastroenterology

## 2017-08-29 DIAGNOSIS — R1314 Dysphagia, pharyngoesophageal phase: Secondary | ICD-10-CM | POA: Diagnosis not present

## 2017-09-06 DIAGNOSIS — M25572 Pain in left ankle and joints of left foot: Secondary | ICD-10-CM | POA: Diagnosis not present

## 2017-09-08 ENCOUNTER — Ambulatory Visit (INDEPENDENT_AMBULATORY_CARE_PROVIDER_SITE_OTHER): Payer: Medicare Other | Admitting: Neurology

## 2017-09-08 DIAGNOSIS — E669 Obesity, unspecified: Secondary | ICD-10-CM

## 2017-09-08 DIAGNOSIS — G4710 Hypersomnia, unspecified: Secondary | ICD-10-CM

## 2017-09-08 DIAGNOSIS — Z794 Long term (current) use of insulin: Secondary | ICD-10-CM

## 2017-09-08 DIAGNOSIS — E1142 Type 2 diabetes mellitus with diabetic polyneuropathy: Secondary | ICD-10-CM

## 2017-09-08 DIAGNOSIS — I10 Essential (primary) hypertension: Secondary | ICD-10-CM

## 2017-09-08 DIAGNOSIS — M0609 Rheumatoid arthritis without rheumatoid factor, multiple sites: Secondary | ICD-10-CM | POA: Diagnosis not present

## 2017-09-08 DIAGNOSIS — G4733 Obstructive sleep apnea (adult) (pediatric): Secondary | ICD-10-CM

## 2017-09-08 DIAGNOSIS — M109 Gout, unspecified: Secondary | ICD-10-CM | POA: Diagnosis not present

## 2017-09-08 DIAGNOSIS — G4730 Sleep apnea, unspecified: Secondary | ICD-10-CM

## 2017-09-08 DIAGNOSIS — G4736 Sleep related hypoventilation in conditions classified elsewhere: Secondary | ICD-10-CM

## 2017-09-09 ENCOUNTER — Other Ambulatory Visit: Payer: Self-pay | Admitting: Neurology

## 2017-09-09 DIAGNOSIS — E669 Obesity, unspecified: Secondary | ICD-10-CM | POA: Insufficient documentation

## 2017-09-09 DIAGNOSIS — G894 Chronic pain syndrome: Secondary | ICD-10-CM

## 2017-09-09 DIAGNOSIS — G4710 Hypersomnia, unspecified: Secondary | ICD-10-CM

## 2017-09-09 DIAGNOSIS — G4733 Obstructive sleep apnea (adult) (pediatric): Secondary | ICD-10-CM | POA: Insufficient documentation

## 2017-09-09 DIAGNOSIS — Z794 Long term (current) use of insulin: Secondary | ICD-10-CM

## 2017-09-09 DIAGNOSIS — G4736 Sleep related hypoventilation in conditions classified elsewhere: Secondary | ICD-10-CM

## 2017-09-09 DIAGNOSIS — E1142 Type 2 diabetes mellitus with diabetic polyneuropathy: Secondary | ICD-10-CM

## 2017-09-09 DIAGNOSIS — G4730 Sleep apnea, unspecified: Secondary | ICD-10-CM

## 2017-09-09 DIAGNOSIS — N182 Chronic kidney disease, stage 2 (mild): Secondary | ICD-10-CM

## 2017-09-09 NOTE — Procedures (Signed)
PATIENT'S NAME:  Angel French, Angel French DOB:                   05-03-1951      MR#:    [id number]     DATE OF RECORDING: 09/08/2017 REFERRING M.D.:  Reginia Forts, M.D. Study Performed:   Titration to CPAP  HISTORY:   Mrs. Angel French is a 66 year old female patient , who returns for a CPAP titration. She has been evaluated in a PSG performed on 08/02/2017, which revealed: AHI of 14.6/hr., REM AHI of 33.6/hr., severe and prolonged hypoxemia at 99 minutes, and with the Nadir of SpO2 being 78%.The patient endorsed the Epworth Sleepiness Scale at 13 points.   The patient's weight 225 pounds with a height of 62 (inches), resulting in a BMI of 41.15 kg/m2. The patient's neck circumference measured 17 inches.  CURRENT MEDICATIONS: Allopurinol, Aspirin, Duloxetine, Escitalopram, Furosemide, Hydrocodone, Insulin, Ipratropium, Leflunomide, Novolog, Omeprazole, Oxybutynin, Prednisone, Simvastatin, Sucralfate and Trazodeone.    PROCEDURE:  This is a multichannel digital polysomnogram utilizing the SomnoStar 11.2 system.  Electrodes and sensors were applied and monitored per AASM Specifications.   EEG, EOG, Chin and Limb EMG, were sampled at 200 Hz.  ECG, Snore and Nasal Pressure, Thermal Airflow, Respiratory Effort, CPAP Flow and Pressure, Oximetry was sampled at 50 Hz. Digital video and audio were recorded.       CPAP was initiated at 5 cmH20 with heated humidity per AASM split night standards and pressure was advanced to 8cmH20 because of hypopneas, apneas and desaturations.  At a PAP pressure of 8 cmH20, there was a reduction of the AHI to 2.1/hr. with improvement of the Nadir to 90% 02 saturation, sleep efficiency of 98.3% and almost complete resolution of obstructive sleep apnea.    Lights Out was at 21:27 and Lights On at 05:02. Total recording time (TRT) was 455.5 minutes, with a total sleep time (TST) of 443 minutes. The patient's sleep latency was 3 minutes with 0 minutes of wake time after sleep onset. REM  latency was 237 minutes.  The sleep efficiency was 97.3 %.    SLEEP ARCHITECTURE: WASO (Wake after sleep onset) was 9.5 minutes.  There were 7 minutes in Stage N1, 217 minutes Stage N2, 211 minutes Stage N3 and 8 minutes in Stage REM.  The percentage of Stage N1 was 1.6%, Stage N2 was 49.%, Stage N3 was 47.6% and Stage R (REM sleep) was 1.8%.   The arousals were noted as: 17 were spontaneous, 0 were associated with PLMs, and only 1 was associated with respiratory events. RESPIRATORY ANALYSIS:  There was a total of 5 respiratory events: 3 obstructive apneas, 0 central apneas and 1 mixed apnea with a total of 4 apneas and an apnea index (AI) of .5 /hour. There was 1 hypopnea with a hypopnea index of 0.1/hour. The patient also had 0 respiratory event related arousals (RERAs).      The total APNEA/HYPOPNEA INDEX  (AHI) was 0.7 /hour and the total RESPIRATORY DISTURBANCE INDEX was 0.7/hour  0 events occurred in REM sleep and 5 events in NREM. The REM AHI was 0.0 /hour versus a non-REM AHI of 0.7 /hour.  The patient spent 443 minutes of total sleep time in the supine position and 0 minutes in non-supine. The supine AHI was 0.6, versus a non-supine AHI of 0.0.  OXYGEN SATURATION & C02:  The baseline 02 saturation was 94%, with the lowest being 85%. Time spent below 89% saturation equaled 66 minutes.  PERIODIC LIMB MOVEMENTS:   The patient had a total of 0 Periodic Limb Movements.  The patient was fitted with an AirFit P10 in small size -apparatus by [employer]. The patient indicated that she slept better than usual.  DIAGNOSIS Resolution of OSA and hypoxemia  under CPAP pressure of 7 and 8 cm water without additional need for oxygen.    PLANS/RECOMMENDATIONS: Start auto CPAP between 5 and 10 cm water pressure with 3 cm EPR and heated humidity, under AirFit P 10 nasal pillow in small size.    A follow up appointment will be scheduled in the Sleep Clinic at [facility].   Please call  [phone] with any questions.     I certify that I have reviewed the entire raw data recording prior to the issuance of this report in accordance with the Standards of Accreditation of the American Academy of Sleep Medicine (AASM)    Larey Seat, M.D.     09-09-2017  Diplomat, American Board of Psychiatry and Neurology  Diplomat, [place] of Sleep Medicine Medical Director, [facility] [employer]

## 2017-09-12 ENCOUNTER — Telehealth: Payer: Self-pay | Admitting: Neurology

## 2017-09-12 NOTE — Telephone Encounter (Signed)
-----   Message from Larey Seat, MD sent at 09/09/2017 12:20 PM EST ----- She did very well with CPAP , which alleviated her OSA and the low oxygen as well. She was surprised how well she tolerated the nasal pillow. I will send an order for CPAP  today.

## 2017-09-12 NOTE — Telephone Encounter (Signed)
I called pt. I advised pt that Dr. Brett Fairy reviewed their sleep study results and found that pt has sleep apnea. Dr. Brett Fairy recommends that pt starts auto CPAP. I reviewed PAP compliance expectations with the pt. Pt is agreeable to starting a CPAP. I advised pt that an order will be sent to a DME, [facility], and [facility] will call the pt within about one week after they file with the pt's insurance. [facility] will show the pt how to use the machine, fit for masks, and troubleshoot the CPAP if needed. A follow up appt was made for insurance purposes with Justin Mend on Nov 17 2017 at 11:00 am. Pt verbalized understanding to arrive 15 minutes early and bring their CPAP. A letter with all of this information in it will be mailed to the pt as a reminder. I verified with the pt that the address we have on file is correct. Pt verbalized understanding of results. Pt had no questions at this time but was encouraged to call back if questions arise.

## 2017-09-13 DIAGNOSIS — H16223 Keratoconjunctivitis sicca, not specified as Sjogren's, bilateral: Secondary | ICD-10-CM | POA: Diagnosis not present

## 2017-09-13 DIAGNOSIS — H04123 Dry eye syndrome of bilateral lacrimal glands: Secondary | ICD-10-CM | POA: Diagnosis not present

## 2017-09-13 DIAGNOSIS — H5213 Myopia, bilateral: Secondary | ICD-10-CM | POA: Diagnosis not present

## 2017-09-13 DIAGNOSIS — H40013 Open angle with borderline findings, low risk, bilateral: Secondary | ICD-10-CM | POA: Diagnosis not present

## 2017-09-13 DIAGNOSIS — H524 Presbyopia: Secondary | ICD-10-CM | POA: Diagnosis not present

## 2017-09-13 DIAGNOSIS — H35373 Puckering of macula, bilateral: Secondary | ICD-10-CM | POA: Diagnosis not present

## 2017-09-13 DIAGNOSIS — H52223 Regular astigmatism, bilateral: Secondary | ICD-10-CM | POA: Diagnosis not present

## 2017-09-13 LAB — HM DIABETES EYE EXAM

## 2017-09-13 NOTE — Progress Notes (Signed)
Patient called and confirmed appointment for esophageal manometry at [facility] at 1030.    Vista Lawman, RN

## 2017-09-14 ENCOUNTER — Encounter (HOSPITAL_COMMUNITY)
Admission: RE | Disposition: A | Discharge status: Home / Self Care | Payer: Self-pay | Source: Ambulatory Visit | Attending: Gastroenterology

## 2017-09-14 ENCOUNTER — Ambulatory Visit (HOSPITAL_COMMUNITY)
Admission: RE | Admit: 2017-09-14 | Discharge: 2017-09-14 | Disposition: A | Discharge status: Home / Self Care | Payer: Medicare Other | Source: Ambulatory Visit | Attending: Gastroenterology | Admitting: Gastroenterology

## 2017-09-14 DIAGNOSIS — R1310 Dysphagia, unspecified: Secondary | ICD-10-CM | POA: Insufficient documentation

## 2017-09-14 HISTORY — PX: ESOPHAGEAL MANOMETRY: SHX5429

## 2017-09-14 SURGERY — MANOMETRY, ESOPHAGUS

## 2017-09-14 MED ORDER — LIDOCAINE VISCOUS 2 % MT SOLN
OROMUCOSAL | Status: AC
  Filled 2017-09-14: qty 15

## 2017-09-14 SURGICAL SUPPLY — 2 items
FACESHIELD LNG OPTICON STERILE (SAFETY) IMPLANT
GLOVE BIO SURGEON STRL SZ8 (GLOVE) ×6 IMPLANT

## 2017-09-14 NOTE — Progress Notes (Signed)
Esophageal Manometry done per protocol. Pt tolerated well without complication or distress. Dr.Schooler to be made aware to read report.

## 2017-09-15 ENCOUNTER — Encounter (HOSPITAL_COMMUNITY): Payer: Self-pay | Admitting: Gastroenterology

## 2017-09-18 ENCOUNTER — Other Ambulatory Visit: Payer: Self-pay | Admitting: Physician Assistant

## 2017-09-20 DIAGNOSIS — R1314 Dysphagia, pharyngoesophageal phase: Secondary | ICD-10-CM | POA: Diagnosis not present

## 2017-09-20 MED ORDER — SODIUM CHLORIDE 0.9 % IV SOLN: INTRAVENOUS | Status: DC

## 2017-09-26 ENCOUNTER — Other Ambulatory Visit: Payer: Self-pay | Admitting: Family Medicine

## 2017-09-26 DIAGNOSIS — Z139 Encounter for screening, unspecified: Secondary | ICD-10-CM

## 2017-09-29 DIAGNOSIS — N183 Chronic kidney disease, stage 3 (moderate): Secondary | ICD-10-CM | POA: Diagnosis not present

## 2017-10-03 ENCOUNTER — Ambulatory Visit: Payer: Medicare Other | Admitting: Family Medicine

## 2017-10-04 DIAGNOSIS — M25572 Pain in left ankle and joints of left foot: Secondary | ICD-10-CM | POA: Diagnosis not present

## 2017-10-07 ENCOUNTER — Ambulatory Visit (INDEPENDENT_AMBULATORY_CARE_PROVIDER_SITE_OTHER): Payer: Medicare Other | Admitting: Family Medicine

## 2017-10-07 ENCOUNTER — Encounter: Payer: Self-pay | Admitting: Family Medicine

## 2017-10-07 ENCOUNTER — Other Ambulatory Visit: Payer: Self-pay

## 2017-10-07 VITALS — BP 142/82 | HR 104 | Temp 98.0°F | Resp 16 | Ht 62.99 in | Wt 218.0 lb

## 2017-10-07 DIAGNOSIS — E7800 Pure hypercholesterolemia, unspecified: Secondary | ICD-10-CM | POA: Diagnosis not present

## 2017-10-07 DIAGNOSIS — E669 Obesity, unspecified: Secondary | ICD-10-CM

## 2017-10-07 DIAGNOSIS — M51369 Other intervertebral disc degeneration, lumbar region without mention of lumbar back pain or lower extremity pain: Secondary | ICD-10-CM

## 2017-10-07 DIAGNOSIS — G894 Chronic pain syndrome: Secondary | ICD-10-CM | POA: Diagnosis not present

## 2017-10-07 DIAGNOSIS — G4736 Sleep related hypoventilation in conditions classified elsewhere: Secondary | ICD-10-CM

## 2017-10-07 DIAGNOSIS — J029 Acute pharyngitis, unspecified: Secondary | ICD-10-CM

## 2017-10-07 DIAGNOSIS — N182 Chronic kidney disease, stage 2 (mild): Secondary | ICD-10-CM

## 2017-10-07 DIAGNOSIS — G4733 Obstructive sleep apnea (adult) (pediatric): Secondary | ICD-10-CM

## 2017-10-07 DIAGNOSIS — E1137X3 Type 2 diabetes mellitus with diabetic macular edema, resolved following treatment, bilateral: Secondary | ICD-10-CM

## 2017-10-07 DIAGNOSIS — Z794 Long term (current) use of insulin: Secondary | ICD-10-CM

## 2017-10-07 DIAGNOSIS — N2889 Other specified disorders of kidney and ureter: Secondary | ICD-10-CM

## 2017-10-07 DIAGNOSIS — M5136 Other intervertebral disc degeneration, lumbar region: Secondary | ICD-10-CM | POA: Diagnosis not present

## 2017-10-07 LAB — GLUCOSE, POCT (MANUAL RESULT ENTRY): POC GLUCOSE: 40 mg/dL — AB (ref 70–99)

## 2017-10-07 LAB — POCT GLYCOSYLATED HEMOGLOBIN (HGB A1C): Hemoglobin A1C: 8

## 2017-10-07 LAB — POCT RAPID STREP A (OFFICE): Rapid Strep A Screen: NEGATIVE

## 2017-10-07 MED ORDER — HYDROCODONE-ACETAMINOPHEN 10-325 MG PO TABS: 1.0000 | ORAL_TABLET | Freq: Four times a day (QID) | ORAL | 0 refills | Status: DC | PRN

## 2017-10-07 NOTE — Patient Instructions (Signed)
     IF you received an x-ray today, you will receive an invoice from [facility]. Please contact [facility] at [phone] with questions or concerns regarding your invoice.   IF you received labwork today, you will receive an invoice from [facility]. Please contact [facility] at [phone] with questions or concerns regarding your invoice.   Our billing staff will not be able to assist you with questions regarding bills from these companies.  You will be contacted with the lab results as soon as they are available. The fastest way to get your results is to activate your My Chart account. Instructions are located on the last page of this paperwork. If you have not heard from us regarding the results in 2 weeks, please contact this office.     

## 2017-10-07 NOTE — Progress Notes (Signed)
Subjective:    Patient ID: Angel French, female    DOB: 1951-05-27, 66 y.o.   MRN: [id number]  10/07/2017  Diabetes (4 month follow-up ) and Chronic Conditions    HPI This 66 y.o. female presents for evaluation of anxiety/depression, rheumatoid arthritis, DMII with retinopathy, chronic pain syndrome.   Really depressed; having such pain to put on boots on spouse. Must cut nails off.   Dr. Lorrene Reid was upset because creatinine 1.66.   L foot fractures multiple: no healing in the two months since breaking foot; nothing has healed.   Continue to wear CMA walker for one more month.  Not wearing boot unless leaves the house.  Wears cast shoe at home. Has started sleeping in the shoe since not healing.  Sore throat; glands are painful.  CPAP for 12 days; one or less episode per hour; helpful byut tired.   BP Readings from Last 3 Encounters:  10/07/17 (!) 142/82  08/05/17 132/60  07/21/17 (!) 152/83   Wt Readings from Last 3 Encounters:  10/07/17 218 lb (98.9 kg)  08/05/17 220 lb (99.8 kg)  07/21/17 225 lb (102.1 kg)   Immunization History  Administered Date(s) Administered  . Hepatitis A, Adult 10/06/2015, 08/12/2016  . Influenza,inj,Quad PF,6+ Mos 07/16/2013, 07/10/2014, 10/15/2015, 08/12/2016, 06/28/2017  . Pneumococcal Conjugate-13 03/23/2017  . Pneumococcal Polysaccharide-23 07/10/2014  . Tdap 10/06/2015    Review of Systems  Constitutional: Positive for fatigue. Negative for chills, diaphoresis and fever.  HENT: Positive for congestion, sore throat and trouble swallowing. Negative for ear pain and voice change.   Eyes: Negative for visual disturbance.  Respiratory: Negative for cough and shortness of breath.   Cardiovascular: Negative for chest pain, palpitations and leg swelling.  Gastrointestinal: Negative for abdominal pain, constipation, diarrhea, nausea and vomiting.  Endocrine: Negative for cold intolerance, heat intolerance, polydipsia, polyphagia and  polyuria.  Musculoskeletal: Positive for arthralgias, back pain and myalgias.  Neurological: Positive for weakness and numbness. Negative for dizziness, tremors, seizures, syncope, facial asymmetry, speech difficulty, light-headedness and headaches.  Psychiatric/Behavioral: Positive for dysphoric mood. Negative for self-injury, sleep disturbance and suicidal ideas. The patient is nervous/anxious.     Past Medical History:  Diagnosis Date  . Allergy    generic allergy pill; Spring and Fall only.  . Anxiety   . Arthritis    DDD lumbar, R hip OA.  s/p ortho consult in past.  . Blood transfusion without reported diagnosis    Mountain climbing accident in [place].  . Brachial plexus disorders   . Cataract    B retractions.  . Chronic kidney disease    stage 3 per pt.   . Depression   . Diabetes mellitus   . Diabetic peripheral neuropathy associated with type 2 diabetes mellitus ([place])   . Diabetic retinopathy ([place])   . Diabetic retinopathy associated with type 2 diabetes mellitus ([place])    s/p laser treatment multiple.  Unable to drive.  Marland Kitchen GERD (gastroesophageal reflux disease)   . Hyperlipidemia   . Hypertension    controlled, off meds   . Neuromuscular disorder ([place])   . Rheumatoid arthritis ([place])   . Ulcer    Peptic ulcer H. Pylori + s/p treatment.  Upper GI diagnosed.[name] Prilosec PRN .   Past Surgical History:  Procedure Laterality Date  .  2 SPINAL INJECTIONS     . ABDOMINAL HYSTERECTOMY  11/02/1979   DUB; cervical dysplasia; ovaries intact.  . ABDOMINAL SURGERY     staph abcess   .  Behavioral [facility] Admission     age 89; three months in [place].  Marland Kitchen CARDIAC CATHETERIZATION  11/02/2007   normal coronary arteries.  . CARPAL TUNNEL RELEASE     Bilateral.  . CATARACT EXTRACTION, BILATERAL    . CHOLECYSTECTOMY    . ESOPHAGEAL MANOMETRY N/A 09/14/2017   Procedure: ESOPHAGEAL MANOMETRY (EM);  Surgeon: Ronnette Juniper, MD;  Location: [facility];  Service: Gastroenterology;   Laterality: N/A;  . EYE SURGERY     Cataracts B. Laser surgery x 7 for Diabetic Retinopathy  . TONSILLECTOMY     Allergies  Allergen Reactions  . Codeine Anaphylaxis  . Contrast Media [Iodinated Diagnostic Agents] Anaphylaxis  . Nitrofurantoin Monohyd Macro Anaphylaxis  . Betadine [Povidone Iodine] Itching  . Folic Acid Itching  . Gabapentin Other (See Comments)    Makes patient feel drunk  . Iodine Hives  . Lyrica [Pregabalin] Other (See Comments)    Makes patient feel drunk  . Red Dye Itching  . Ultram [Tramadol Hcl] Nausea And Vomiting   Current Outpatient Medications on File Prior to Visit  Medication Sig Dispense Refill  . allopurinol (ZYLOPRIM) 100 MG tablet Take 1 tablet (100 mg total) by mouth daily. Ov needed 90 tablet 3  . aspirin 325 MG tablet Take 325 mg by mouth daily.     . Blood Glucose Monitoring Suppl (BLOOD GLUCOSE METER KIT AND SUPPLIES) KIT Dispense based on patient and insurance preference. Use up to four times daily as directed. (FOR ICD-9 250.00, 250.01). 1 each 11  . diclofenac sodium (VOLTAREN) 1 % GEL Apply 2 g topically 4 (four) times daily. (Patient taking differently: Apply 2 g topically 2 (two) times daily as needed (pain). ) 100 g 3  . DULoxetine (CYMBALTA) 60 MG capsule Take 1 capsule (60 mg total) by mouth daily. 90 capsule 1  . escitalopram (LEXAPRO) 20 MG tablet TAKE 1 TABLET (20 MG TOTAL) BY MOUTH DAILY. 30 tablet 11  . furosemide (LASIX) 20 MG tablet Take 1-2 tablets (20-40 mg total) by mouth daily. 180 tablet 3  . glucose blood test strip Check sugar three times daily  Dx: DMII insulin dependent with retinopathy, neuropathy controlled 300 each 3  . insulin detemir (LEVEMIR) 100 UNIT/ML injection USE 60 UNITS AS DIRECTED AT BEDTIME dx: E11.42, T61.4431 (Patient taking differently: USE 60 UNITS AS DIRECTED IN AM dx: E11.42, V40.0867) 20 mL 1  . insulin detemir (LEVEMIR) 100 UNIT/ML injection USE 60 UNITS AS DIRECTED AT BEDTIME dx: E11.42, Y19.5093)  (Patient taking differently: USE 40 UNITS AS DIRECTED AT BEDTIME dx: E11.42, O67.1245)) 20 mL 1  . Insulin Syringes, Disposable, U-100 0.5 ML MISC 28 Units by Does not apply route 2 (two) times daily. 100 each 11  . ipratropium (ATROVENT) 0.03 % nasal spray Place 2 sprays into the nose 2 (two) times daily. 30 mL 11  . leflunomide (ARAVA) 20 MG tablet     . Needles & Syringes MISC 1 Syringe by Does not apply route 2 (two) times daily. 100 each 11  . NOVOLOG 100 UNIT/ML injection INJECT 15-20 UNITS SUBCUTANEOUSLY 3 TIMES DAILY PER SLIDING SCALE 10 mL 7  . nystatin cream (MYCOSTATIN) Apply 1 application topically 2 (two) times daily. 90 g 5  . omeprazole (PRILOSEC) 20 MG capsule Take 1 capsule (20 mg total) by mouth daily. 90 capsule 3  . oxybutynin (DITROPAN XL) 15 MG 24 hr tablet Take 1 tablet (15 mg total) by mouth at bedtime. 90 tablet 3  . predniSONE (DELTASONE)  5 MG tablet Take 5 mg by mouth daily with breakfast.    . simvastatin (ZOCOR) 40 MG tablet Take 1 tablet (40 mg total) by mouth daily at 6 PM. 90 tablet 1  . sucralfate (CARAFATE) 1 g tablet     . traZODone (DESYREL) 100 MG tablet TAKE 2 TABS BY MOUTH AT BEDTIME 180 tablet 2  . ULTICARE INSULIN SYRINGE 31G X 5/16" 0.5 ML MISC USE AS DIRECTED TO INJECT INSULIN 2 TIMES DAILY 100 each 4   Current Facility-Administered Medications on File Prior to Visit  Medication Dose Route Frequency Provider Last Rate Last Dose  . 0.9 %  sodium chloride infusion   Intravenous Continuous Ronnette Juniper, MD       Social History   Socioeconomic History  . Marital status: Widowed    Spouse name: Marijean Niemann  . Number of children: 2  . Years of education: college  . Highest education level: Not on file  Social Needs  . Financial resource strain: Not on file  . Food insecurity - worry: Not on file  . Food insecurity - inability: Not on file  . Transportation needs - medical: Not on file  . Transportation needs - non-medical: Not on file    Occupational History  . Occupation: retired    Comment: retretied  Tobacco Use  . Smoking status: Former Research scientist (life sciences)  . Smokeless tobacco: Never Used  . Tobacco comment: Quit 1987  Substance and Sexual Activity  . Alcohol use: No    Alcohol/week: 0.0 oz  . Drug use: No  . Sexual activity: Yes    Birth control/protection: Surgical, Post-menopausal    Comment: widow  Other Topics Concern  . Not on file  Social History Narrative   Marital status: widowed since 2009; dating x 6 years.  Happy; no abuse.      Children: 2 children (28 daughter, 27 son estranged); 2 grandchildren.      Lives: with boyfriend, daughter, granddaughter, friend of daughter.  Lives in pt house.      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  Diabetic retinopathy; unable to drive.      Tobacco:  Smoked x 20 years; quit 20 years.      Alcohol:  On special occasions; once per week on average.       Drugs:  None since college.      Exercise:  Walking several times per week; walks the dog.   Education college   Caffeine one cup daily.   Right handed      Advanced Directives: none; FULL CODE.  DNR/DNI.  HCPOA: [place]?           Family History  Adopted: Yes  Family history unknown: Yes       Objective:    BP (!) 142/82   Pulse (!) 104   Temp 98 F (36.7 C) (Oral)   Resp 16   Ht 5' 2.99" (1.6 m)   Wt 218 lb (98.9 kg)   SpO2 96%   BMI 38.63 kg/m  Physical Exam  Constitutional: She is oriented to person, place, and time. She appears well-developed and well-nourished. No distress.  HENT:  Head: Normocephalic and atraumatic.  Right Ear: Tympanic membrane, external ear and ear canal normal.  Left Ear: Tympanic membrane, external ear and ear canal normal.  Nose: Nose normal.  Mouth/Throat: Uvula is midline and mucous membranes are normal. Posterior oropharyngeal erythema present. No oropharyngeal exudate.  Eyes: Conjunctivae and EOM are normal.  Pupils are equal, round, and reactive to  light.  Neck: Normal range of motion. Neck supple. Carotid bruit is not present. No thyromegaly present.  Cardiovascular: Normal rate, regular rhythm, normal heart sounds and intact distal pulses. Exam reveals no gallop and no friction rub.  No murmur heard. Pulmonary/Chest: Effort normal and breath sounds normal. She has no wheezes. She has no rales.  Abdominal: Soft. Bowel sounds are normal. She exhibits no distension and no mass. There is no tenderness. There is no rebound and no guarding.  Lymphadenopathy:    She has no cervical adenopathy.  Neurological: She is alert and oriented to person, place, and time. No cranial nerve deficit.  Skin: Skin is warm and dry. No rash noted. She is not diaphoretic. No erythema. No pallor.  Psychiatric: She has a normal mood and affect. Her behavior is normal.   No results found. Depression screen [facility] 2/9 10/07/2017 06/28/2017 03/23/2017 01/05/2017 12/14/2016  Decreased Interest 1 0 0 0 0  Down, Depressed, Hopeless 1 0 0 0 0  PHQ - 2 Score 2 0 0 0 0  Altered sleeping 1 - - - -  Tired, decreased energy 1 - - - -  Change in appetite 1 - - - -  Feeling bad or failure about yourself  1 - - - -  Trouble concentrating 0 - - - -  Moving slowly or fidgety/restless 0 - - - -  Suicidal thoughts 0 - - - -  PHQ-9 Score 6 - - - -   Fall Risk  10/07/2017 06/28/2017 03/23/2017 01/05/2017 12/14/2016  Falls in the past year? Yes Yes Yes Yes Yes  Comment - - - - -  Number falls in past yr: 2 or more - 2 or more 2 or more 1  Injury with Fall? No - (No Data) No No  Comment - - minor - -  Risk for fall due to : - - - - -    Results for orders placed or performed in visit on 10/07/17  Culture, Group A Strep  Result Value Ref Range   Strep A Culture Comment   CBC with Differential/Platelet  Result Value Ref Range   WBC 5.8 3.4 - 10.8 x10E3/uL   RBC 3.36 (L) 3.77 - 5.28 x10E6/uL   Hemoglobin 10.8 (L) 11.1 - 15.9 g/dL   Hematocrit 32.7 (L) 34.0 - 46.6 %   MCV 97 79 - 97  fL   MCH 32.1 26.6 - 33.0 pg   MCHC 33.0 31.5 - 35.7 g/dL   RDW 15.1 12.3 - 15.4 %   Platelets 271 150 - 379 x10E3/uL   Neutrophils 48 Not Estab. %   Lymphs 38 Not Estab. %   Monocytes 7 Not Estab. %   Eos 6 Not Estab. %   Basos 1 Not Estab. %   Neutrophils Absolute 2.8 1.4 - 7.0 x10E3/uL   Lymphocytes Absolute 2.2 0.7 - 3.1 x10E3/uL   Monocytes Absolute 0.4 0.1 - 0.9 x10E3/uL   EOS (ABSOLUTE) 0.3 0.0 - 0.4 x10E3/uL   Basophils Absolute 0.0 0.0 - 0.2 x10E3/uL   Immature Granulocytes 0 Not Estab. %   Immature Grans (Abs) 0.0 0.0 - 0.1 x10E3/uL  Comprehensive metabolic panel  Result Value Ref Range   Glucose 48 (L) 65 - 99 mg/dL   BUN 22 8 - 27 mg/dL   Creatinine, Ser 1.33 (H) 0.57 - 1.00 mg/dL   GFR calc non Af Amer 42 (L) >59 mL/min/1.73   GFR calc Af  Amer 48 (L) >59 mL/min/1.73   BUN/Creatinine Ratio 17 12 - 28   Sodium 143 134 - 144 mmol/L   Potassium 3.8 3.5 - 5.2 mmol/L   Chloride 103 96 - 106 mmol/L   CO2 26 20 - 29 mmol/L   Calcium 9.5 8.7 - 10.3 mg/dL   Total Protein 7.3 6.0 - 8.5 g/dL   Albumin 4.1 3.6 - 4.8 g/dL   Globulin, Total 3.2 1.5 - 4.5 g/dL   Albumin/Globulin Ratio 1.3 1.2 - 2.2   Bilirubin Total 0.4 0.0 - 1.2 mg/dL   Alkaline Phosphatase 87 39 - 117 IU/L   AST 54 (H) 0 - 40 IU/L   ALT 45 (H) 0 - 32 IU/L  Lipid panel  Result Value Ref Range   Cholesterol, Total 208 (H) 100 - 199 mg/dL   Triglycerides 241 (H) 0 - 149 mg/dL   HDL 43 >39 mg/dL   VLDL Cholesterol Cal 48 (H) 5 - 40 mg/dL   LDL Calculated 117 (H) 0 - 99 mg/dL   Chol/HDL Ratio 4.8 (H) 0.0 - 4.4 ratio  Pain Management Screening Profile (10S)  Result Value Ref Range   Amphetamine Scrn, Ur Negative Cutoff=1000 ng/mL   BARBITURATE SCREEN URINE Negative Cutoff=200 ng/mL   BENZODIAZEPINE SCREEN, URINE Negative Cutoff=200 ng/mL   CANNABINOIDS UR QL SCN Negative Cutoff=20 ng/mL   Cocaine (Metab) Scrn, Ur Negative Cutoff=300 ng/mL   Opiate Scrn, Ur Positive (A) Cutoff=300 ng/mL    OXYCODONE+OXYMORPHONE UR QL SCN Negative Cutoff=100 ng/mL   Phencyclidine Qn, Ur Negative Cutoff=25 ng/mL   Methadone Screen, Urine Negative Cutoff=300 ng/mL   Propoxyphene Scrn, Ur Negative Cutoff=300 ng/mL   Creatinine(Crt), U 54.0 20.0 - 300.0 mg/dL   Ph of Urine 6.7 4.5 - 8.9   PLEASE NOTE: Comment   Med List Option Not Selected  Result Value Ref Range   Med List Option Not Selected WILL FOLLOW   POCT glucose (manual entry)  Result Value Ref Range   POC Glucose 40 (A) 70 - 99 mg/dl  POCT glycosylated hemoglobin (Hb A1C)  Result Value Ref Range   Hemoglobin A1C 8.0   POCT rapid strep A  Result Value Ref Range   Rapid Strep A Screen Negative Negative       Assessment & Plan:   1. 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 Pure hypercholesterolemia   3. Sore throat   4. Degenerative disc disease, lumbar   5. Chronic pain syndrome   6. OSA (obstructive sleep apnea)   7. Chronic renal impairment, stage 2 (mild)   8. Nocturnal hypoxemia due to obesity    -worsening control of DMII; encourage improved compliance with diet, exercise, and medications.  -New onset sore throat; rapid strep negative; send throat cx; treat supportively with rest, gargles, Tylenol. -chronic pain syndrome due to DDD lumbar spine; obtain UDS per CDC guidelines and pain contract; refill of hydrocodone provided. -worsening depression and anxiety due to declining health, chronic pain, family stressors; coping relatively well; continue medications at current doses. -just initiated CPAP for OSA; hopeful that fatigue with improve. -followed closely by nephrology and rheumatology.  Orders Placed This Encounter  Procedures  . Culture, Group A Strep    Order Specific Question:   Source    Answer:   oropharynx  . CBC with Differential/Platelet  . Comprehensive metabolic panel    Order Specific Question:   Has  the patient fasted?    Answer:   No  . Lipid panel    Order Specific Question:   Has the patient fasted?    Answer:   No  . Pain Management Screening Profile (10S)  . Med List Option Not Selected  . POCT glucose (manual entry)  . POCT glycosylated hemoglobin (Hb A1C)  . POCT rapid strep A   Meds ordered this encounter  Medications  . DISCONTD: HYDROcodone-acetaminophen (NORCO) 10-325 MG tablet    Sig: Take 1 tablet by mouth every 6 (six) hours as needed.    Dispense:  120 tablet    Refill:  0    Do not fill for 60 days after prescribed  . DISCONTD: HYDROcodone-acetaminophen (NORCO) 10-325 MG tablet    Sig: Take 1 tablet by mouth every 6 (six) hours as needed.    Dispense:  120 tablet    Refill:  0    Do not fill for 30 days after prescribed  . HYDROcodone-acetaminophen (NORCO) 10-325 MG tablet    Sig: Take 1 tablet by mouth every 6 (six) hours as needed.    Dispense:  120 tablet    Refill:  0    Return in about 3 months (around 01/05/2018) for follow-up chronic medical conditions.   Antionne Enrique Elayne Guerin, M.D. Primary Care at [facility] [address] [place], [place]  [address] [phone] phone [phone] fax

## 2017-10-08 LAB — CBC WITH DIFFERENTIAL/PLATELET
BASOS ABS: 0 10*3/uL (ref 0.0–0.2)
Basos: 1 %
EOS (ABSOLUTE): 0.3 10*3/uL (ref 0.0–0.4)
Eos: 6 %
HEMATOCRIT: 32.7 % — AB (ref 34.0–46.6)
HEMOGLOBIN: 10.8 g/dL — AB (ref 11.1–15.9)
Immature Grans (Abs): 0 10*3/uL (ref 0.0–0.1)
Immature Granulocytes: 0 %
LYMPHS ABS: 2.2 10*3/uL (ref 0.7–3.1)
Lymphs: 38 %
MCH: 32.1 pg (ref 26.6–33.0)
MCHC: 33 g/dL (ref 31.5–35.7)
MCV: 97 fL (ref 79–97)
MONOCYTES: 7 %
MONOS ABS: 0.4 10*3/uL (ref 0.1–0.9)
Neutrophils Absolute: 2.8 10*3/uL (ref 1.4–7.0)
Neutrophils: 48 %
Platelets: 271 10*3/uL (ref 150–379)
RBC: 3.36 x10E6/uL — AB (ref 3.77–5.28)
RDW: 15.1 % (ref 12.3–15.4)
WBC: 5.8 10*3/uL (ref 3.4–10.8)

## 2017-10-08 LAB — MED LIST OPTION NOT SELECTED

## 2017-10-08 LAB — COMPREHENSIVE METABOLIC PANEL
ALBUMIN: 4.1 g/dL (ref 3.6–4.8)
ALK PHOS: 87 IU/L (ref 39–117)
ALT: 45 IU/L — ABNORMAL HIGH (ref 0–32)
AST: 54 IU/L — AB (ref 0–40)
Albumin/Globulin Ratio: 1.3 (ref 1.2–2.2)
BILIRUBIN TOTAL: 0.4 mg/dL (ref 0.0–1.2)
BUN / CREAT RATIO: 17 (ref 12–28)
BUN: 22 mg/dL (ref 8–27)
CHLORIDE: 103 mmol/L (ref 96–106)
CO2: 26 mmol/L (ref 20–29)
Calcium: 9.5 mg/dL (ref 8.7–10.3)
Creatinine, Ser: 1.33 mg/dL — ABNORMAL HIGH (ref 0.57–1.00)
GFR calc Af Amer: 48 mL/min/{1.73_m2} — ABNORMAL LOW (ref >59)
GFR calc non Af Amer: 42 mL/min/{1.73_m2} — ABNORMAL LOW (ref >59)
GLOBULIN, TOTAL: 3.2 g/dL (ref 1.5–4.5)
GLUCOSE: 48 mg/dL — AB (ref 65–99)
POTASSIUM: 3.8 mmol/L (ref 3.5–5.2)
SODIUM: 143 mmol/L (ref 134–144)
Total Protein: 7.3 g/dL (ref 6.0–8.5)

## 2017-10-08 LAB — LIPID PANEL
CHOLESTEROL TOTAL: 208 mg/dL — AB (ref 100–199)
Chol/HDL Ratio: 4.8 ratio — ABNORMAL HIGH (ref 0.0–4.4)
HDL: 43 mg/dL (ref >39)
LDL Calculated: 117 mg/dL — ABNORMAL HIGH (ref 0–99)
TRIGLYCERIDES: 241 mg/dL — AB (ref 0–149)
VLDL Cholesterol Cal: 48 mg/dL — ABNORMAL HIGH (ref 5–40)

## 2017-10-10 ENCOUNTER — Other Ambulatory Visit: Payer: Self-pay | Admitting: Family Medicine

## 2017-10-10 DIAGNOSIS — F329 Major depressive disorder, single episode, unspecified: Secondary | ICD-10-CM

## 2017-10-10 DIAGNOSIS — F32A Depression, unspecified: Secondary | ICD-10-CM

## 2017-10-10 LAB — PMP SCREEN PROFILE (10S), URINE
Amphetamine Scrn, Ur: NEGATIVE ng/mL
BARBITURATE SCREEN URINE: NEGATIVE ng/mL
BENZODIAZEPINE SCREEN, URINE: NEGATIVE ng/mL
CANNABINOIDS UR QL SCN: NEGATIVE ng/mL
COCAINE(METAB.)SCREEN, URINE: NEGATIVE ng/mL
Creatinine(Crt), U: 54 mg/dL (ref 20.0–300.0)
METHADONE SCREEN, URINE: NEGATIVE ng/mL
OPIATE SCREEN URINE: POSITIVE ng/mL — AB
OXYCODONE+OXYMORPHONE UR QL SCN: NEGATIVE ng/mL
PROPOXYPHENE SCREEN URINE: NEGATIVE ng/mL
Ph of Urine: 6.7 (ref 4.5–8.9)
Phencyclidine Qn, Ur: NEGATIVE ng/mL

## 2017-10-11 LAB — CULTURE, GROUP A STREP: Strep A Culture: NEGATIVE

## 2017-10-11 NOTE — Telephone Encounter (Signed)
Last OV 10/07/17. Last prescription on 02/02/17 with 180 tabs and 2 refills.

## 2017-10-14 ENCOUNTER — Ambulatory Visit: Payer: Medicare Other | Admitting: Family Medicine

## 2017-10-19 DIAGNOSIS — M0609 Rheumatoid arthritis without rheumatoid factor, multiple sites: Secondary | ICD-10-CM | POA: Diagnosis not present

## 2017-10-19 DIAGNOSIS — Z79899 Other long term (current) drug therapy: Secondary | ICD-10-CM | POA: Diagnosis not present

## 2017-10-19 DIAGNOSIS — M5136 Other intervertebral disc degeneration, lumbar region: Secondary | ICD-10-CM | POA: Diagnosis not present

## 2017-10-19 DIAGNOSIS — E669 Obesity, unspecified: Secondary | ICD-10-CM | POA: Diagnosis not present

## 2017-10-19 DIAGNOSIS — M109 Gout, unspecified: Secondary | ICD-10-CM | POA: Diagnosis not present

## 2017-10-19 DIAGNOSIS — M2550 Pain in unspecified joint: Secondary | ICD-10-CM | POA: Diagnosis not present

## 2017-10-19 DIAGNOSIS — R682 Dry mouth, unspecified: Secondary | ICD-10-CM | POA: Diagnosis not present

## 2017-10-19 DIAGNOSIS — Z6839 Body mass index (BMI) 39.0-39.9, adult: Secondary | ICD-10-CM | POA: Diagnosis not present

## 2017-10-22 ENCOUNTER — Encounter: Payer: Self-pay | Admitting: Neurology

## 2017-10-26 DIAGNOSIS — M0609 Rheumatoid arthritis without rheumatoid factor, multiple sites: Secondary | ICD-10-CM | POA: Diagnosis not present

## 2017-10-26 DIAGNOSIS — Z79899 Other long term (current) drug therapy: Secondary | ICD-10-CM | POA: Diagnosis not present

## 2017-10-27 ENCOUNTER — Ambulatory Visit
Admission: RE | Admit: 2017-10-27 | Discharge: 2017-10-27 | Disposition: A | Discharge status: Home / Self Care | Payer: Medicare Other | Source: Ambulatory Visit | Attending: Family Medicine | Admitting: Family Medicine

## 2017-10-27 DIAGNOSIS — Z1231 Encounter for screening mammogram for malignant neoplasm of breast: Secondary | ICD-10-CM | POA: Diagnosis not present

## 2017-10-27 DIAGNOSIS — Z139 Encounter for screening, unspecified: Secondary | ICD-10-CM

## 2017-11-07 DIAGNOSIS — M25572 Pain in left ankle and joints of left foot: Secondary | ICD-10-CM | POA: Diagnosis not present

## 2017-11-17 ENCOUNTER — Ambulatory Visit: Payer: Self-pay

## 2017-11-23 DIAGNOSIS — Z79899 Other long term (current) drug therapy: Secondary | ICD-10-CM | POA: Diagnosis not present

## 2017-11-23 DIAGNOSIS — Z6838 Body mass index (BMI) 38.0-38.9, adult: Secondary | ICD-10-CM | POA: Diagnosis not present

## 2017-11-23 DIAGNOSIS — M0609 Rheumatoid arthritis without rheumatoid factor, multiple sites: Secondary | ICD-10-CM | POA: Diagnosis not present

## 2017-11-23 DIAGNOSIS — R682 Dry mouth, unspecified: Secondary | ICD-10-CM | POA: Diagnosis not present

## 2017-11-23 DIAGNOSIS — M791 Myalgia, unspecified site: Secondary | ICD-10-CM | POA: Diagnosis not present

## 2017-11-23 DIAGNOSIS — E669 Obesity, unspecified: Secondary | ICD-10-CM | POA: Diagnosis not present

## 2017-11-23 DIAGNOSIS — M5136 Other intervertebral disc degeneration, lumbar region: Secondary | ICD-10-CM | POA: Diagnosis not present

## 2017-11-23 DIAGNOSIS — M109 Gout, unspecified: Secondary | ICD-10-CM | POA: Diagnosis not present

## 2017-11-23 DIAGNOSIS — M2550 Pain in unspecified joint: Secondary | ICD-10-CM | POA: Diagnosis not present

## 2017-12-03 ENCOUNTER — Encounter: Payer: Self-pay | Admitting: Family Medicine

## 2017-12-03 ENCOUNTER — Ambulatory Visit (INDEPENDENT_AMBULATORY_CARE_PROVIDER_SITE_OTHER): Payer: Medicare Other | Admitting: Family Medicine

## 2017-12-03 VITALS — BP 140/68 | HR 99 | Temp 97.7°F | Resp 16 | Ht 62.9 in | Wt 206.0 lb

## 2017-12-03 DIAGNOSIS — R350 Frequency of micturition: Secondary | ICD-10-CM

## 2017-12-03 DIAGNOSIS — E1137X3 Type 2 diabetes mellitus with diabetic macular edema, resolved following treatment, bilateral: Secondary | ICD-10-CM | POA: Diagnosis not present

## 2017-12-03 DIAGNOSIS — R8290 Unspecified abnormal findings in urine: Secondary | ICD-10-CM | POA: Diagnosis not present

## 2017-12-03 DIAGNOSIS — F4321 Adjustment disorder with depressed mood: Secondary | ICD-10-CM

## 2017-12-03 DIAGNOSIS — R300 Dysuria: Secondary | ICD-10-CM

## 2017-12-03 DIAGNOSIS — Z794 Long term (current) use of insulin: Secondary | ICD-10-CM

## 2017-12-03 LAB — POCT URINALYSIS DIP (MANUAL ENTRY)
BILIRUBIN UA: NEGATIVE
BILIRUBIN UA: NEGATIVE mg/dL
Glucose, UA: NEGATIVE mg/dL
Nitrite, UA: POSITIVE — AB
PH UA: 5.5 (ref 5.0–8.0)
Protein Ur, POC: NEGATIVE mg/dL
Spec Grav, UA: 1.015 (ref 1.010–1.025)
Urobilinogen, UA: 0.2 E.U./dL

## 2017-12-03 LAB — POC MICROSCOPIC URINALYSIS (UMFC): MUCUS RE: ABSENT

## 2017-12-03 LAB — GLUCOSE, POCT (MANUAL RESULT ENTRY): POC Glucose: 225 mg/dl — AB (ref 70–99)

## 2017-12-03 MED ORDER — ALPRAZOLAM 0.5 MG PO TABS: 0.5000 mg | ORAL_TABLET | Freq: Two times a day (BID) | ORAL | 2 refills | Status: DC | PRN

## 2017-12-03 MED ORDER — CEPHALEXIN 500 MG PO CAPS: 500.0000 mg | ORAL_CAPSULE | Freq: Three times a day (TID) | ORAL | 0 refills | Status: DC

## 2017-12-03 NOTE — Progress Notes (Signed)
Subjective:    Patient ID: Angel French, female    DOB: [birth year], 67 y.o.   MRN: [id number]  12/03/2017  Depression (See screening, Fiance passed away ) and Urinary Frequency (Dysuria. Foam, abnormal)    HPI This 67 y.o. female presents for evaluation of acute grief reaction and dysuria with urinary frequency.  Fianc passed away on New Year's Eve after being in seen in the office earlier that day.  Awoke complaining of chest pressure and nausea.  Vomited once and face turned extremely red.  And suffered loss of consciousness and patient had to perform CPR until EMTs arrived.  Prolonged resuscitation in the emergency department unsuccessful.  Patient not eating and very very sad.  Feels lost without fianc.  Has good family support from daughter and granddaughter who live in the home.  Denies suicidal ideation.  Not interested in counseling.  Not sleeping.  Dysuria: With associated urinary frequency, bone, abnormal odor.  Denies fever, chills, sweats.  Denies nausea, vomiting.  Denies abdominal pain.  Positive back pain yet chronic.  Denies vaginal discharge or vaginal bleeding.  Denies hematuria.  Not checking sugars.   BP Readings from Last 3 Encounters:  12/06/17 128/78  12/03/17 140/68  10/07/17 (!) 142/82   Wt Readings from Last 3 Encounters:  12/06/17 213 lb (96.6 kg)  12/03/17 206 lb (93.4 kg)  10/07/17 218 lb (98.9 kg)   Immunization History  Administered Date(s) Administered  . Hepatitis A, Adult 10/06/2015, 08/12/2016  . Influenza,inj,Quad PF,6+ Mos 07/16/2013, 07/10/2014, 10/15/2015, 08/12/2016, 06/28/2017  . Pneumococcal Conjugate-13 03/23/2017  . Pneumococcal Polysaccharide-23 07/10/2014  . Tdap 10/06/2015    Review of Systems  Constitutional: Negative for chills, diaphoresis, fatigue and fever.  Eyes: Negative for visual disturbance.  Respiratory: Negative for cough and shortness of breath.   Cardiovascular: Negative for chest pain, palpitations and leg  swelling.  Gastrointestinal: Negative for abdominal distention, abdominal pain, constipation, diarrhea, nausea and vomiting.  Endocrine: Negative for cold intolerance, heat intolerance, polydipsia, polyphagia and polyuria.  Genitourinary: Positive for dysuria, frequency and urgency. Negative for decreased urine volume, difficulty urinating, dyspareunia, enuresis, flank pain, genital sores, hematuria, menstrual problem, pelvic pain, vaginal bleeding, vaginal discharge and vaginal pain.  Neurological: Negative for dizziness, tremors, seizures, syncope, facial asymmetry, speech difficulty, weakness, light-headedness, numbness and headaches.    Past Medical History:  Diagnosis Date  . Allergy    generic allergy pill; Spring and Fall only.  . Anxiety   . Arthritis    DDD lumbar, R hip OA.  s/p ortho consult in past.  . Blood transfusion without reported diagnosis    Mountain climbing accident in [place].  . Brachial plexus disorders   . Cataract    B retractions.  . Chronic kidney disease    stage 3 per pt.   . Depression   . Diabetes mellitus   . Diabetic peripheral neuropathy associated with type 2 diabetes mellitus ([place])   . Diabetic retinopathy ([place])   . Diabetic retinopathy associated with type 2 diabetes mellitus ([place])    s/p laser treatment multiple.  Unable to drive.  Marland Kitchen GERD (gastroesophageal reflux disease)   . Hyperlipidemia   . Hypertension    controlled, off meds   . Neuromuscular disorder ([place])   . Rheumatoid arthritis ([place])   . Ulcer    Peptic ulcer H. Pylori + s/p treatment.  Upper GI diagnosed.[name] Prilosec PRN .   Past Surgical History:  Procedure Laterality Date  .  2 SPINAL  INJECTIONS     . ABDOMINAL HYSTERECTOMY  11/02/1979   DUB; cervical dysplasia; ovaries intact.  . ABDOMINAL SURGERY     staph abcess   . [facility]     age 58; three months in [place].  Marland Kitchen BREAST BIOPSY    . CARDIAC CATHETERIZATION  11/02/2007   normal coronary  arteries.  . CARPAL TUNNEL RELEASE     Bilateral.  . CATARACT EXTRACTION, BILATERAL    . CHOLECYSTECTOMY    . ESOPHAGEAL MANOMETRY N/A 09/14/2017   Procedure: ESOPHAGEAL MANOMETRY (EM);  Surgeon: Ronnette Juniper, MD;  Location: [facility];  Service: Gastroenterology;  Laterality: N/A;  . EYE SURGERY     Cataracts B. Laser surgery x 7 for Diabetic Retinopathy  . TONSILLECTOMY     Allergies  Allergen Reactions  . Codeine Anaphylaxis  . Contrast Media [Iodinated Diagnostic Agents] Anaphylaxis  . Nitrofurantoin Monohyd Macro Anaphylaxis  . Betadine [Povidone Iodine] Itching  . Folic Acid Itching  . Gabapentin Other (See Comments)    Makes patient feel drunk  . Iodine Hives  . Lyrica [Pregabalin] Other (See Comments)    Makes patient feel drunk  . Red Dye Itching  . Ultram [Tramadol Hcl] Nausea And Vomiting   Current Outpatient Medications on File Prior to Visit  Medication Sig Dispense Refill  . allopurinol (ZYLOPRIM) 100 MG tablet Take 1 tablet (100 mg total) by mouth daily. Ov needed (Patient taking differently: Take 200 mg by mouth daily. ) 90 tablet 3  . aspirin 325 MG tablet Take 325 mg by mouth daily.     . Blood Glucose Monitoring Suppl (BLOOD GLUCOSE METER KIT AND SUPPLIES) KIT Dispense based on patient and insurance preference. Use up to four times daily as directed. (FOR ICD-9 250.00, 250.01). 1 each 11  . diclofenac sodium (VOLTAREN) 1 % GEL Apply 2 g topically 4 (four) times daily. (Patient taking differently: Apply 2 g topically 2 (two) times daily as needed (pain). ) 100 g 3  . DULoxetine (CYMBALTA) 60 MG capsule Take 1 capsule (60 mg total) by mouth daily. 90 capsule 1  . escitalopram (LEXAPRO) 20 MG tablet TAKE 1 TABLET (20 MG TOTAL) BY MOUTH DAILY. 30 tablet 11  . glucose blood test strip Check sugar three times daily  Dx: DMII insulin dependent with retinopathy, neuropathy controlled 300 each 3  . HYDROcodone-acetaminophen (NORCO) 10-325 MG tablet Take 1 tablet by mouth  every 6 (six) hours as needed. 120 tablet 0  . insulin detemir (LEVEMIR) 100 UNIT/ML injection USE 60 UNITS AS DIRECTED AT BEDTIME dx: E11.42, L27.5170 (Patient taking differently: USE 60 UNITS AS DIRECTED IN AM dx: E11.42, Y17.4944) 20 mL 1  . insulin detemir (LEVEMIR) 100 UNIT/ML injection USE 60 UNITS AS DIRECTED AT BEDTIME dx: E11.42, H67.5916) (Patient taking differently: USE 40 UNITS AS DIRECTED AT BEDTIME dx: E11.42, B84.6659)) 20 mL 1  . ipratropium (ATROVENT) 0.03 % nasal spray Place 2 sprays into the nose 2 (two) times daily. 30 mL 11  . leflunomide (ARAVA) 20 MG tablet     . Needles & Syringes MISC 1 Syringe by Does not apply route 2 (two) times daily. 100 each 11  . NOVOLOG 100 UNIT/ML injection INJECT 15-20 UNITS SUBCUTANEOUSLY 3 TIMES DAILY PER SLIDING SCALE 10 mL 7  . nystatin cream (MYCOSTATIN) Apply 1 application topically 2 (two) times daily. 90 g 5  . omeprazole (PRILOSEC) 20 MG capsule Take 1 capsule (20 mg total) by mouth daily. 90 capsule 3  .  oxybutynin (DITROPAN XL) 15 MG 24 hr tablet Take 1 tablet (15 mg total) by mouth at bedtime. (Patient taking differently: Take 30 mg by mouth at bedtime. ) 90 tablet 3  . predniSONE (DELTASONE) 5 MG tablet Take 5 mg by mouth daily with breakfast.    . simvastatin (ZOCOR) 40 MG tablet Take 1 tablet (40 mg total) by mouth daily at 6 PM. 90 tablet 1  . sucralfate (CARAFATE) 1 g tablet     . traZODone (DESYREL) 100 MG tablet TAKE 2 TABS BY MOUTH AT BEDTIME 180 tablet 0  . ULTICARE INSULIN SYRINGE 31G X 5/16" 0.5 ML MISC USE AS DIRECTED TO INJECT INSULIN 2 TIMES DAILY 100 each 4  . Insulin Syringes, Disposable, U-100 0.5 ML MISC 28 Units by Does not apply route 2 (two) times daily. 100 each 11   Current Facility-Administered Medications on File Prior to Visit  Medication Dose Route Frequency Provider Last Rate Last Dose  . 0.9 %  sodium chloride infusion   Intravenous Continuous Ronnette Juniper, MD       Social History   Socioeconomic  History  . Marital status: Widowed    Spouse name: Marijean Niemann  . Number of children: 2  . Years of education: college  . Highest education level: Not on file  Social Needs  . Financial resource strain: Not on file  . Food insecurity - worry: Not on file  . Food insecurity - inability: Not on file  . Transportation needs - medical: Not on file  . Transportation needs - non-medical: Not on file  Occupational History  . Occupation: retired    Comment: retretied  Tobacco Use  . Smoking status: Former Research scientist (life sciences)  . Smokeless tobacco: Never Used  . Tobacco comment: Quit 1987  Substance and Sexual Activity  . Alcohol use: No    Alcohol/week: 0.0 oz  . Drug use: No  . Sexual activity: Yes    Birth control/protection: Surgical, Post-menopausal    Comment: widow  Other Topics Concern  . Not on file  Social History Narrative   Marital status: widowed since 2009; dating x 6 years.  Happy; no abuse.      Children: 2 children (73 daughter, 21 son estranged); 2 grandchildren.      Lives: with boyfriend, daughter, granddaughter, friend of daughter.  Lives in pt house.      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  Diabetic retinopathy; unable to drive.      Tobacco:  Smoked x 20 years; quit 20 years.      Alcohol:  On special occasions; once per week on average.       Drugs:  None since college.      Exercise:  Walking several times per week; walks the dog.   Education college   Caffeine one cup daily.   Right handed      Advanced Directives: none; FULL CODE.  DNR/DNI.  HCPOA: [place]?           Family History  Adopted: Yes  Family history unknown: Yes       Objective:    BP 140/68   Pulse 99   Temp 97.7 F (36.5 C) (Oral)   Resp 16   Ht 5' 2.9" (1.598 m)   Wt 206 lb (93.4 kg)   SpO2 97%   BMI 36.61 kg/m  Physical Exam  Constitutional: She is oriented to person, place, and time. She appears well-developed and well-nourished. No distress.  HENT:    Head: Normocephalic and atraumatic.  Eyes: Conjunctivae are normal. Pupils are equal, round, and reactive to light.  Neck: Normal range of motion. Neck supple.  Cardiovascular: Normal rate, regular rhythm and normal heart sounds. Exam reveals no gallop and no friction rub.  No murmur heard. Pulmonary/Chest: Effort normal and breath sounds normal. She has no wheezes. She has no rales.  Abdominal: Soft. Bowel sounds are normal. She exhibits no distension and no mass. There is tenderness in the suprapubic area. There is no rebound and no guarding.  Neurological: She is alert and oriented to person, place, and time.  Skin: She is not diaphoretic.  Psychiatric: Her speech is normal and behavior is normal. Judgment and thought content normal. Cognition and memory are normal. She exhibits a depressed mood.  Nursing note and vitals reviewed.  No results found. Depression screen [facility] 2/9 12/03/2017 10/07/2017 06/28/2017 03/23/2017 01/05/2017  Decreased Interest 3 1 0 0 0  Down, Depressed, Hopeless 3 1 0 0 0  PHQ - 2 Score 6 2 0 0 0  Altered sleeping 3 1 - - -  Tired, decreased energy 3 1 - - -  Change in appetite 3 1 - - -  Feeling bad or failure about yourself  0 1 - - -  Trouble concentrating 3 0 - - -  Moving slowly or fidgety/restless 0 0 - - -  Suicidal thoughts 0 0 - - -  PHQ-9 Score 18 6 - - -  Difficult doing work/chores Very difficult - - - -   Fall Risk  12/03/2017 10/07/2017 06/28/2017 03/23/2017 01/05/2017  Falls in the past year? _0   Comment - - - - -  Number falls in past yr: - 2 or more - 2 or more 2 or more  Injury with Fall? - No - (No Data) No  Comment - - - minor -  Risk for fall due to : - - - - -        Assessment & Plan:   1. Dysuria   2. Urinary frequency   3. Abnormal urine   4. Grief reaction   5. 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  Urinalysis abnormal.  Send urine culture.  Treat empirically with Keflex 500 mg 3 times daily.  Return to clinic for fever, vomiting, severe flank pain.  Grief reaction: New onset since the death of fianc on November 22, 2017.  Counseling provided during visit.  Excellent family support from daughter and granddaughter.  Declined psychotherapy.  Insomnia: Secondary to grief reaction.  Agreeable to short-term alprazolam 0.5 mg 1/2-1 twice daily as needed for anxiety or insomnia.  Discussed risk of alprazolam usage with opiates.  Patient expresses understanding.  Diabetes mellitus type 2 insulin requiring: Noncompliance with monitoring sugars at home and with insulin therapy.  Decreased p.o. intake during grieving process.  Sugar elevated at 225 during visit.  Highly encouraged and improved compliance with monitoring and medications.  Patient's daughter present during visit and will assist with monitoring and medication compliance.  Orders Placed This Encounter  Procedures  . Urine Culture    Order Specific Question:   Source    Answer:   clean catch  . POCT urinalysis dipstick  . POCT Microscopic Urinalysis ([facility])  . POCT glucose (manual entry)   Meds  ordered this encounter  Medications  . ALPRAZolam (XANAX) 0.5 MG tablet    Sig: Take 1 tablet (0.5 mg total) by mouth 2 (two) times daily as needed for anxiety.    Dispense:  45 tablet    Refill:  2  . cephALEXin (KEFLEX) 500 MG capsule    Sig: Take 1 capsule (500 mg total) by mouth 3 (three) times daily.    Dispense:  21 capsule    Refill:  0    No Follow-up on file.   Oshae Simmering Elayne Guerin, M.D. Primary Care at [facility] [place] [address] [place], [place]  [address] [phone] phone [phone] fax

## 2017-12-03 NOTE — Patient Instructions (Addendum)
IF you received an x-ray today, you will receive an invoice from [facility]. Please contact [facility] at [phone] with questions or concerns regarding your invoice.   IF you received labwork today, you will receive an invoice from [facility]. Please contact [facility] at [phone] with questions or concerns regarding your invoice.   Our billing staff will not be able to assist you with questions regarding bills from these companies.  You will be contacted with the lab results as soon as they are available. The fastest way to get your results is to activate your My Chart account. Instructions are located on the last page of this paperwork. If you have not heard from [name] regarding the results in 2 weeks, please contact this office.    ggr Complicated Grieving Grief is a normal response to the death of someone close to you. Feelings of fear, anger, and guilt can affect almost everyone who loses a loved one. It is also common to have symptoms of depression while you are grieving. These include problems with sleep, loss of appetite, and lack of energy. They may last for weeks or months after a loss. Complicated grief is different from normal grief or depression. Normal grieving involves sadness and feelings of loss, but these feelings are not constant. Complicated grief is a constant and severe type of grief. It interferes with your ability to function normally. It may last for several months to a year or longer. Complicated grief may require treatment from a mental health care provider. What are the causes? It is not known why some people continue to struggle with grief and others do not. You may be at higher risk for complicated grief if:  The death of your loved one was sudden or unexpected.  The death of your loved one was due to a violent event.  Your loved one committed suicide.  Your loved one was a child or a young person.  You were very close to or dependent  on the loved one.  You have a history of depression.  What are the signs or symptoms? Signs and symptoms of complicated grief may include:  Feeling disbelief or numbness.  Being unable to enjoy good memories of your loved one.  Needing to avoid anything that reminds you of your loved one.  Being unable to stop thinking about the death.  Feeling intense anger or guilt.  Feeling alone and hopeless.  Feeling that your life is meaningless and empty.  Losing the desire to live.  How is this diagnosed? Your health care provider may diagnose complicated grief if:  You have constant symptoms of grief for 6-12 months or longer.  Your symptoms are interfering with your ability to live your life.  Your health care provider may want you to see a mental health care provider. Many symptoms of depression are similar to the symptoms of complicated grief. It is important to be evaluated for complicated grief along with other mental health conditions. How is this treated? Talk therapy with a mental health provider is the most common treatment for complicated grief. During therapy, you will learn healthy ways to cope with the loss of your loved one. In some cases, your mental health care provider may also recommend antidepressant medicines. Follow these instructions at home:  Take care of yourself. ? Eat regular meals and maintain a healthy diet. Eat plenty of fruits, vegetables, and whole grains. ? Try to get some exercise each day. ? Keep regular hours for  sleep. Try to get at least 8 hours of sleep each night.  Do not use drugs or alcohol to ease your symptoms.  Take medicines only as directed by your health care provider.  Spend time with friends and loved ones.  Consider joining a grief (bereavement) support group to help you deal with your loss.  Keep all follow-up visits as directed by your health care provider. This is important. Contact a health care provider if:  Your  symptoms keep you from functioning normally.  Your symptoms do not get better with treatment. Get help right away if:  You have serious thoughts of hurting yourself or someone else.  You have suicidal feelings. This information is not intended to replace advice given to you by your health care provider. Make sure you discuss any questions you have with your health care provider. Document Released: 10/18/2005 Document Revised: 03/25/2016 Document Reviewed: 03/28/2014 Elsevier Interactive Patient Education  [employer].

## 2017-12-04 ENCOUNTER — Other Ambulatory Visit: Payer: Self-pay | Admitting: Family Medicine

## 2017-12-06 ENCOUNTER — Ambulatory Visit (INDEPENDENT_AMBULATORY_CARE_PROVIDER_SITE_OTHER): Payer: Medicare Other | Admitting: Neurology

## 2017-12-06 ENCOUNTER — Encounter: Payer: Self-pay | Admitting: Neurology

## 2017-12-06 VITALS — BP 128/78 | HR 60 | Ht 62.0 in | Wt 213.0 lb

## 2017-12-06 DIAGNOSIS — Z9989 Dependence on other enabling machines and devices: Secondary | ICD-10-CM

## 2017-12-06 DIAGNOSIS — E662 Morbid (severe) obesity with alveolar hypoventilation: Secondary | ICD-10-CM

## 2017-12-06 DIAGNOSIS — G4733 Obstructive sleep apnea (adult) (pediatric): Secondary | ICD-10-CM

## 2017-12-06 LAB — URINE CULTURE

## 2017-12-06 NOTE — Progress Notes (Signed)
[place]   Provider:  Larey Seat, [place] D  Primary Care Physician:  Wardell Honour, MD   Referring Provider: Wardell Honour, MD    Chief Complaint  Patient presents with  . Follow-up    pt alone, rm 10. pt states that CPAP is working well but that she has issues with opening her mouth wearing the nasal piece.     HPI:  Angel French is a 67 y.o. female , seen here as in a revisit . I have the pleasure of seeing Angel French today on 06 December 2017.  The patient had 2 distinct events have changed her sleep habit in the short-term.  She lost her fianc of the last 9 years, the couple lived together" she is grieving and it has affected how well she sleeps.  In addition, she was diagnosed with obstructive sleep apnea after a baseline polysomnography on 02 August 2017 and has begun using CPAP highly compliant at 100%.   The baseline study showed mild apnea with an AHI of 14.6 in rem sleep this was accentuated to an AHI of 33.6/h the patient slept all night supine during the test.  She did not have periodic limb movements or other arousals.  Nadir SPO2 was 78% with 99 minutes of total desaturation time and because of the CPAP was the only option of treatment.  It is also possible that the patient retain CO2 but this was not tested.  She was titrated to CPAP on 8 November, reached a sleep efficiency of 97.3% by using a CPAP pressure of 8 cmH2O.  Both apnea and hypoxemia resolved under 7 and 8 cm water pressure and she used a air fit nasal pillow P 10 small size.   This has now become a difficulty as it has irritated the nostrils, has dried out her nasal passages and she reports that she has significant oral air leaks, her mouth just drops open during sleep.  But she has already read some of the benefits of CPAP therapy she would French these addressed today.  First of all I would French to congratulate her to 100% compliance on CPAP, residual AHI of only 0.6/h average use of time 9 hours  and 53 minutes, she is using an AutoSet between 5 and 10 cmH2O with 3 cm EPR and the 95th percentile pressure is 9.9 cmH2O.  Based on this I will actually bump up her maximum pressure to 12 cmH2O and we are going to reset her humidifier for higher levels here today.  If she wants to switch to a full facemask I will refer her to [facility]ero care to be fitted.  Therapies benefits are reflected in an Epworth sleepiness score down to 6 points and fatigue severity score to 31 points from previous high level Epworth score at 13 points.       Today's 07/21/2017 and I have the pleasure of seeing Angel French, was a new patient to my practice. Her daughter, was also present here today, reports that she has witnessed snoring and apnea as has Angel French bed partner. Two girlfriends who have used CPAP on trips noted her apnea and were surprised that she didn't have a CPAP. The patient is not sure if she has apnea, but she has been notched many times to turn over or to start breathing again. She also is excessively daytime sleepy reflected in an Epworth sleepiness score of 13 points fatigue severity at 39 points and there is a mild  clinical depression at 5/15 points on her geriatric depression index.  The patient has an extensive past medical history which is documented in epic through her primary care physician. Sleep is affected by arthritis, anxiety, she had a brachial plexus lesion, she has CKG stage III as reported by her, obesity, diabetes mellitus with neuropathy, angiopathy type II and retinopathy. She has significant visual impairment. She also has GERD, hyperlipidemia, hypertension. Foot pain-RA.  She is status post a tonsillectomy in childhood. Status post cataract surgery, cholecystectomy, cardiac catheterization in 2009 which was normal, abdominal surgery.  Sleep habits are as follows: The patient spends her late evening's reading or watching TV before going to bed, her bedroom is cool quiet and dark. She  takes trazodone to help her fall asleep. Bedtime is usually around 10 PM. She is usually asleep within 30 minutes. The patient describes usually sleeping on her back. This may be when her snoring is the loudest.She wakes regularly at 2 AM to go to the bathroom, and again at 6 AM. Otherwise she can't sleep through. She often sleeps poorly, especially when there are appointments or activity scheduled for the following day. If she knows that the following day is up to herself she sleeps better. There is an anticipation -stress factor in this description of insomnia. She usually tries to get up by 8:30 AM. By her own estimate she sleeps 2 blocks of 3 hours only.  Sleep medical history and family sleep history: Insomnia begun at age 55 and has progressed since. The patient is widowed for 9 years. The patient was married for 35 years, has 2 adult children. The patient was adopted.  Social history: The patient is widowed since 2009 she has to adult children, 2 grandchildren and lives with her boyfriend and her own private home. She started smoking at age 67 ended up smoking 3 packs per day for many years and quit in 1980.  She lost her fiancee in jan 2019 and is grieving, has been having more insomnia.  She seldom drinks alcohol, caffeine intake is "enormous" by her own description.  12-05-2017 The patient averaged 10 caffeinated beverages a day - now down to 2 decaffeinated coffees, quit mountain dew. .     Review of Systems: Out of a complete 14 system review, the patient complains of only the following symptoms, and all other reviewed systems are negative.   Epworth score 13 , Fatigue severity score 39 , depression score 5   Social History   Socioeconomic History  . Marital status: Widowed    Spouse name: Marijean Niemann  . Number of children: 2  . Years of education: college  . Highest education level: Not on file  Social Needs  . Financial resource strain: Not on file  . Food insecurity -  worry: Not on file  . Food insecurity - inability: Not on file  . Transportation needs - medical: Not on file  . Transportation needs - non-medical: Not on file  Occupational History  . Occupation: retired    Comment: retretied  Tobacco Use  . Smoking status: Former Research scientist (life sciences)  . Smokeless tobacco: Never Used  . Tobacco comment: Quit 1987  Substance and Sexual Activity  . Alcohol use: No    Alcohol/week: 0.0 oz  . Drug use: No  . Sexual activity: Yes    Birth control/protection: Surgical, Post-menopausal    Comment: widow  Other Topics Concern  . Not on file  Social History Narrative   Marital status: widowed since  2009; dating x 6 years.  Happy; no abuse.      Children: 2 children (42 daughter, 80 son estranged); 2 grandchildren.      Lives: with boyfriend, daughter, granddaughter, friend of daughter.  Lives in pt house.      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  Diabetic retinopathy; unable to drive.      Tobacco:  Smoked x 20 years; quit 20 years.      Alcohol:  On special occasions; once per week on average.       Drugs:  None since college.      Exercise:  Walking several times per week; walks the dog.   Education college   Caffeine one cup daily.   Right handed      Advanced Directives: none; FULL CODE.  DNR/DNI.  HCPOA: [place]?            Family History  Adopted: Yes  Family history unknown: Yes    Past Medical History:  Diagnosis Date  . Allergy    generic allergy pill; Spring and Fall only.  . Anxiety   . Arthritis    DDD lumbar, R hip OA.  s/p ortho consult in past.  . Blood transfusion without reported diagnosis    Mountain climbing accident in [place].  . Brachial plexus disorders   . Cataract    B retractions.  . Chronic kidney disease    stage 3 per pt.   . Depression   . Diabetes mellitus   . Diabetic peripheral neuropathy associated with type 2 diabetes mellitus ([place])   . Diabetic retinopathy ([place])   . Diabetic  retinopathy associated with type 2 diabetes mellitus ([place])    s/p laser treatment multiple.  Unable to drive.  Marland Kitchen GERD (gastroesophageal reflux disease)   . Hyperlipidemia   . Hypertension    controlled, off meds   . Neuromuscular disorder ([place])   . Rheumatoid arthritis ([place])   . Ulcer    Peptic ulcer H. Pylori + s/p treatment.  Upper GI diagnosed.[name] Prilosec PRN .    Past Surgical History:  Procedure Laterality Date  .  2 SPINAL INJECTIONS     . ABDOMINAL HYSTERECTOMY  11/02/1979   DUB; cervical dysplasia; ovaries intact.  . ABDOMINAL SURGERY     staph abcess   . [facility] Ad[facility]     age 89; three months in [place].  Marland Kitchen BREAST BIOPSY    . CARDIAC CATHETERIZATION  11/02/2007   normal coronary arteries.  . CARPAL TUNNEL RELEASE     Bilateral.  . CATARACT EXTRACTION, BILATERAL    . CHOLECYSTECTOMY    . ESOPHAGEAL MANOMETRY N/A 09/14/2017   Procedure: ESOPHAGEAL MANOMETRY (EM);  Surgeon: Ronnette Juniper, MD;  Location: [facility];  Service: Gastroenterology;  Laterality: N/A;  . EYE SURGERY     Cataracts B. Laser surgery x 7 for Diabetic Retinopathy  . TONSILLECTOMY      Current Outpatient Medications  Medication Sig Dispense Refill  . allopurinol (ZYLOPRIM) 100 MG tablet Take 1 tablet (100 mg total) by mouth daily. Ov needed (Patient taking differently: Take 200 mg by mouth daily. ) 90 tablet 3  . ALPRAZolam (XANAX) 0.5 MG tablet Take 1 tablet (0.5 mg total) by mouth 2 (two) times daily as needed for anxiety. 45 tablet 2  . aspirin 325 MG tablet Take 325 mg by mouth daily.     . Blood Glucose Monitoring Suppl (BLOOD GLUCOSE METER KIT AND  SUPPLIES) KIT Dispense based on patient and insurance preference. Use up to four times daily as directed. (FOR ICD-9 250.00, 250.01). 1 each 11  . cephALEXin (KEFLEX) 500 MG capsule Take 1 capsule (500 mg total) by mouth 3 (three) times daily. 21 capsule 0  . diclofenac sodium (VOLTAREN) 1 % GEL Apply 2 g topically 4 (four) times  daily. (Patient taking differently: Apply 2 g topically 2 (two) times daily as needed (pain). ) 100 g 3  . DULoxetine (CYMBALTA) 60 MG capsule Take 1 capsule (60 mg total) by mouth daily. 90 capsule 1  . escitalopram (LEXAPRO) 20 MG tablet TAKE 1 TABLET (20 MG TOTAL) BY MOUTH DAILY. 30 tablet 11  . furosemide (LASIX) 20 MG tablet TAKE 1 TAB BY MOUTH ONCE DAILY 90 tablet 0  . glucose blood test strip Check sugar three times daily  Dx: DMII insulin dependent with retinopathy, neuropathy controlled 300 each 3  . HYDROcodone-acetaminophen (NORCO) 10-325 MG tablet Take 1 tablet by mouth every 6 (six) hours as needed. 120 tablet 0  . insulin detemir (LEVEMIR) 100 UNIT/ML injection USE 60 UNITS AS DIRECTED AT BEDTIME dx: E11.42, I34.7425 (Patient taking differently: USE 60 UNITS AS DIRECTED IN AM dx: E11.42, Z56.3875) 20 mL 1  . insulin detemir (LEVEMIR) 100 UNIT/ML injection USE 60 UNITS AS DIRECTED AT BEDTIME dx: E11.42, I43.3295) (Patient taking differently: USE 40 UNITS AS DIRECTED AT BEDTIME dx: E11.42, J88.4166)) 20 mL 1  . Insulin Syringes, Disposable, U-100 0.5 ML MISC 28 Units by Does not apply route 2 (two) times daily. 100 each 11  . ipratropium (ATROVENT) 0.03 % nasal spray Place 2 sprays into the nose 2 (two) times daily. 30 mL 11  . leflunomide (ARAVA) 20 MG tablet     . Needles & Syringes MISC 1 Syringe by Does not apply route 2 (two) times daily. 100 each 11  . NOVOLOG 100 UNIT/ML injection INJECT 15-20 UNITS SUBCUTANEOUSLY 3 TIMES DAILY PER SLIDING SCALE 10 mL 7  . nystatin cream (MYCOSTATIN) Apply 1 application topically 2 (two) times daily. 90 g 5  . omeprazole (PRILOSEC) 20 MG capsule Take 1 capsule (20 mg total) by mouth daily. 90 capsule 3  . oxybutynin (DITROPAN XL) 15 MG 24 hr tablet Take 1 tablet (15 mg total) by mouth at bedtime. (Patient taking differently: Take 30 mg by mouth at bedtime. ) 90 tablet 3  . predniSONE (DELTASONE) 5 MG tablet Take 5 mg by mouth daily with breakfast.     . simvastatin (ZOCOR) 40 MG tablet Take 1 tablet (40 mg total) by mouth daily at 6 PM. 90 tablet 1  . sucralfate (CARAFATE) 1 g tablet     . traZODone (DESYREL) 100 MG tablet TAKE 2 TABS BY MOUTH AT BEDTIME 180 tablet 0  . ULTICARE INSULIN SYRINGE 31G X 5/16" 0.5 ML MISC USE AS DIRECTED TO INJECT INSULIN 2 TIMES DAILY 100 each 4   No current facility-administered medications for this visit.    Facility-Administered Medications Ordered in Other Visits  Medication Dose Route Frequency Provider Last Rate Last Dose  . 0.9 %  sodium chloride infusion   Intravenous Continuous Ronnette Juniper, MD        Allergies as of 12/06/2017 - Review Complete 12/06/2017  Allergen Reaction Noted  . Codeine Anaphylaxis 04/24/2014  . Contrast media [iodinated diagnostic agents] Anaphylaxis 10/03/2011  . Nitrofurantoin monohyd macro Anaphylaxis 10/03/2011  . Betadine [povidone iodine] Itching 04/13/2016  . Folic acid Itching 04/30/1600  . Gabapentin Other (  See Comments) 08/12/2016  . Iodine Hives 10/03/2011  . Lyrica [pregabalin] Other (See Comments) 08/12/2016  . Red dye Itching 01/01/2013  . Ultram [tramadol hcl] Nausea And Vomiting 10/03/2011    Vitals: BP 128/78   Pulse 60   Ht '5\' 2"'$  (1.575 m)   Wt 213 lb (96.6 kg)   BMI 38.96 kg/m  Last Weight:  Wt Readings from Last 1 Encounters:  12/06/17 213 lb (96.6 kg)   NOI:BBCW mass index is 38.96 kg/m.     Last Height:   Ht Readings from Last 1 Encounters:  12/06/17 '5\' 2"'$  (1.575 m)    Physical exam:  General: The patient is awake, alert and appears not in acute distress. The patient is well groomed. Head: Normocephalic, atraumatic. Neck is supple. Mallampati 5,  neck circumference:17. Nasal airflow congested ,  Retrognathia is not seen.  Cardiovascular:  Regular rate and rhythm , without  murmurs or carotid bruit, and without distended neck veins. Respiratory: Lungs are clear to auscultation. Skin:  With evidence of ankle -edema, or rash Trunk:  BMI is 41- The patient's posture is erect.  Neurologic exam : The patient is awake and alert, oriented to place and time.   Memory subjective described as intact.  Mood and affect are appropriate.  Cranial nerves:  Taste and smell are preserved.  Pupils are equal and briskly reactive to light.  Extraocular movements  in vertical and horizontal planes intact and without nystagmus. Visual fields by finger perimetry are intact. Hearing impaired to high frequency- cannot afford hearing aids.   Facial sensation intact to fine touch. Facial motor strength is symmetric and tongue and uvula move midline. Shoulder shrug was symmetrical.   Motor exam: Normal tone, muscle bulk and symmetric strength in all extremities. Sensory:  Fine touch, pinprick and vibration were tested in all extremities.  Coordination: Rapid alternating movements /. Finger-to-nose maneuver  normal without evidence of ataxia, dysmetria or tremor. Gait and station: Patient walks without assistive device and is able unassisted to climb up to the exam table. Strength within normal limits. Stance is stable and normal.  Turns with 3 Steps. Romberg testing is negative. Deep tendon reflexes: in the  upper and lower extremities are symmetric and intact.    Assessment:  After physical and neurologic examination, review of laboratory studies,  Personal review of imaging studies, reports of other /same  Imaging studies, results of polysomnography and / or neurophysiology testing and pre-existing records as far as provided in visit., my assessment is   1)  Angel French has obstructive sleep apnea, and severe comorbidities:  metabolic syndrome, diabetes mellitus type 2, BMI, large Mallampati and neck circumference, hypoxemia - all can be related to obesity hypoventilation. This REM accentuated sleep apnea in supine sleep only has responded very well to low-dose CPAP pressures.  Her 95th percentile pressure is 9.9 cm water and for this reason I will  have to lift a maximum pressure on her auto titration CPAP.  To make the use of a nasal pillow more comfortable we will bump up her humidity today and she can then also discussed with aero care if she still wants to switch to a full facemask.  CPAP user with 100% compliance with significant benefits as a reduction in Epworth sleepiness score, fatigue score, she feels cognitively more alert oriented, her sleep had improved until the death of her fianc.  Now not more than one bathroom break at night down from 3 or 4.    The patient  was advised of the nature of the diagnosed disorder , the treatment options and the  risks for general health and wellness arising from not treating the condition.   I spent more than 25 minutes of face to face time with the patient.  Greater than 50% of time was spent in counseling and coordination of care. We have discussed the diagnosis and differential and I answered the patient's questions.    Plan:  Treatment plan and additional workup : Consider referral to [facility], Dr. Leafy Ro - she was called in January 2019 but has to postpone the class due to her fiancee's death at the time.   [facility] to adjust max CPAP pressure to 12 cm water.  Mask should be per patient preference, consider FFM if nasal pillow uncomfortable.     Larey Seat, MD 05/07/1847, 5:92 AM  Certified in Neurology by ABPN Certified in [place] by [facility] [address] [place], [place] [address]

## 2017-12-07 DIAGNOSIS — M109 Gout, unspecified: Secondary | ICD-10-CM | POA: Diagnosis not present

## 2017-12-07 DIAGNOSIS — M0609 Rheumatoid arthritis without rheumatoid factor, multiple sites: Secondary | ICD-10-CM | POA: Diagnosis not present

## 2017-12-19 ENCOUNTER — Other Ambulatory Visit: Payer: Self-pay | Admitting: Family Medicine

## 2018-01-06 ENCOUNTER — Other Ambulatory Visit: Payer: Self-pay | Admitting: Family Medicine

## 2018-01-06 DIAGNOSIS — F329 Major depressive disorder, single episode, unspecified: Secondary | ICD-10-CM

## 2018-01-06 DIAGNOSIS — F32A Depression, unspecified: Secondary | ICD-10-CM

## 2018-01-06 NOTE — Telephone Encounter (Signed)
LOV: 12/03/17  Dr. Tamala Julian  [facility] [place] on [address]

## 2018-01-09 ENCOUNTER — Encounter: Payer: Self-pay | Admitting: Family Medicine

## 2018-01-09 ENCOUNTER — Ambulatory Visit (INDEPENDENT_AMBULATORY_CARE_PROVIDER_SITE_OTHER): Payer: Medicare Other | Admitting: Family Medicine

## 2018-01-09 ENCOUNTER — Other Ambulatory Visit: Payer: Self-pay

## 2018-01-09 VITALS — BP 120/68 | HR 93 | Temp 98.6°F | Resp 16 | Ht 62.5 in | Wt 219.0 lb

## 2018-01-09 DIAGNOSIS — E7800 Pure hypercholesterolemia, unspecified: Secondary | ICD-10-CM

## 2018-01-09 DIAGNOSIS — F329 Major depressive disorder, single episode, unspecified: Secondary | ICD-10-CM | POA: Diagnosis not present

## 2018-01-09 DIAGNOSIS — G894 Chronic pain syndrome: Secondary | ICD-10-CM | POA: Diagnosis not present

## 2018-01-09 DIAGNOSIS — E1142 Type 2 diabetes mellitus with diabetic polyneuropathy: Secondary | ICD-10-CM | POA: Diagnosis not present

## 2018-01-09 DIAGNOSIS — I10 Essential (primary) hypertension: Secondary | ICD-10-CM | POA: Diagnosis not present

## 2018-01-09 DIAGNOSIS — E113593 Type 2 diabetes mellitus with proliferative diabetic retinopathy without macular edema, bilateral: Secondary | ICD-10-CM

## 2018-01-09 DIAGNOSIS — M5136 Other intervertebral disc degeneration, lumbar region: Secondary | ICD-10-CM | POA: Diagnosis not present

## 2018-01-09 DIAGNOSIS — M05742 Rheumatoid arthritis with rheumatoid factor of left hand without organ or systems involvement: Secondary | ICD-10-CM | POA: Diagnosis not present

## 2018-01-09 DIAGNOSIS — N182 Chronic kidney disease, stage 2 (mild): Secondary | ICD-10-CM

## 2018-01-09 DIAGNOSIS — M05741 Rheumatoid arthritis with rheumatoid factor of right hand without organ or systems involvement: Secondary | ICD-10-CM

## 2018-01-09 LAB — GLUCOSE, POCT (MANUAL RESULT ENTRY): POC GLUCOSE: 53 mg/dL — AB (ref 70–99)

## 2018-01-09 LAB — POCT GLYCOSYLATED HEMOGLOBIN (HGB A1C): Hemoglobin A1C: 11

## 2018-01-09 MED ORDER — HYDROCODONE-ACETAMINOPHEN 10-325 MG PO TABS: 1.0000 | ORAL_TABLET | Freq: Two times a day (BID) | ORAL | 0 refills | Status: DC | PRN

## 2018-01-09 MED ORDER — ROSUVASTATIN CALCIUM 10 MG PO TABS: 10.0000 mg | ORAL_TABLET | Freq: Every day | ORAL | 1 refills | Status: DC

## 2018-01-09 NOTE — Progress Notes (Signed)
Subjective:    Patient ID: Angel French, female    DOB: 26-Apr-1951, 67 y.o.   MRN: [id number]  01/09/2018  Chronic Condition (3 month follow-up )    HPI This 67 y.o. female presents for three month follow-up of DMII, hypertension, hypercholesterolemia, depression/anxiety. Management changes made at last visit: -worsening control of DMII; encourage improved compliance with diet, exercise, and medications.  -New onset sore throat; rapid strep negative; send throat cx; treat supportively with rest, gargles, Tylenol. -chronic pain syndrome due to DDD lumbar spine; obtain UDS per CDC guidelines and pain contract; refill of hydrocodone provided. -worsening depression and anxiety due to declining health, chronic pain, family stressors; coping relatively well; continue medications at current doses. -just initiated CPAP for OSA; hopeful that fatigue with improve. -followed closely by nephrology and rheumatology Labs from last visit include the following: Strep throat culture negative. Urine drug screen consistent with current opiate prescribing. Mild anemia present with a hemoglobin of 10.8 which is a new finding. I recommend repeating at your next visit.  Glucose/sugar is low at 48. Hemoglobin A1c above goal at 8.0. Creatinine is stable at 1.33. Liver function tests (AST, ALT) are slightly elevated. Recommend repeating at your next visit. Cholesterol is elevated again. Recommend discussing switching simvastatin to a more effective agent at your next visit.  UPDATE: Continues to mourn the loss of husband. Taking insulin three days per week.  Took up drinking since last visit; drinking heavy. Weight up six pounds in one month. Cannot eat out every meal.   Sleeping 4-5 hours per night.  9-10; waking up 2:00am.  Did not refill Xanax because caused dizziness and wavering.  Did not refill.  Not good. Current infusions are really helping hand pain.  Finally found the right infusion;  increased dose and increased frequency.  Only taking hydrocodone two daily.   BP Readings from Last 3 Encounters:  01/09/18 120/68  12/06/17 128/78  12/03/17 140/68   Wt Readings from Last 3 Encounters:  01/09/18 219 lb (99.3 kg)  12/06/17 213 lb (96.6 kg)  12/03/17 206 lb (93.4 kg)   Immunization History  Administered Date(s) Administered  . Hepatitis A, Adult 10/06/2015, 08/12/2016  . Influenza,inj,Quad PF,6+ Mos 07/16/2013, 07/10/2014, 10/15/2015, 08/12/2016, 06/28/2017  . Pneumococcal Conjugate-13 03/23/2017  . Pneumococcal Polysaccharide-23 07/10/2014  . Tdap 10/06/2015    Review of Systems  Constitutional: Negative for chills, diaphoresis, fatigue and fever.  Eyes: Negative for visual disturbance.  Respiratory: Negative for cough and shortness of breath.   Cardiovascular: Negative for chest pain, palpitations and leg swelling.  Gastrointestinal: Negative for abdominal pain, constipation, diarrhea, nausea and vomiting.  Endocrine: Negative for cold intolerance, heat intolerance, polydipsia, polyphagia and polyuria.  Musculoskeletal: Positive for arthralgias and back pain.  Neurological: Positive for numbness. Negative for dizziness, tremors, seizures, syncope, facial asymmetry, speech difficulty, weakness, light-headedness and headaches.  Psychiatric/Behavioral: Positive for dysphoric mood. Negative for self-injury, sleep disturbance and suicidal ideas.    Past Medical History:  Diagnosis Date  . Allergy    generic allergy pill; Spring and Fall only.  . Anxiety   . Arthritis    DDD lumbar, R hip OA.  s/p ortho consult in past.  . Blood transfusion without reported diagnosis    Mountain climbing accident in [place].  . Brachial plexus disorders   . Cataract    B retractions.  . Chronic kidney disease    stage 3 per pt.   . Depression   . Diabetes mellitus   .  Diabetic peripheral neuropathy associated with type 2 diabetes mellitus ([place])   . Diabetic retinopathy  ([place])   . Diabetic retinopathy associated with type 2 diabetes mellitus ([place])    s/p laser treatment multiple.  Unable to drive.  Marland Kitchen GERD (gastroesophageal reflux disease)   . Hyperlipidemia   . Hypertension    controlled, off meds   . Neuromuscular disorder ([place])   . Rheumatoid arthritis ([place])   . Ulcer    Peptic ulcer H. Pylori + s/p treatment.  Upper GI diagnosed.[name] Prilosec PRN .   Past Surgical History:  Procedure Laterality Date  .  2 SPINAL INJECTIONS     . ABDOMINAL HYSTERECTOMY  11/02/1979   DUB; cervical dysplasia; ovaries intact.  . ABDOMINAL SURGERY     staph abcess   . [facility] Admission     age 51; three months in [place].  Marland Kitchen BREAST BIOPSY    . CARDIAC CATHETERIZATION  11/02/2007   normal coronary arteries.  . CARPAL TUNNEL RELEASE     Bilateral.  . CATARACT EXTRACTION, BILATERAL    . CHOLECYSTECTOMY    . ESOPHAGEAL MANOMETRY N/A 09/14/2017   Procedure: ESOPHAGEAL MANOMETRY (EM);  Surgeon: Ronnette Juniper, MD;  Location: [facility];  Service: Gastroenterology;  Laterality: N/A;  . EYE SURGERY     Cataracts B. Laser surgery x 7 for Diabetic Retinopathy  . TONSILLECTOMY     Allergies  Allergen Reactions  . Codeine Anaphylaxis  . Contrast Media [Iodinated Diagnostic Agents] Anaphylaxis  . Nitrofurantoin Monohyd Macro Anaphylaxis  . Betadine [Povidone Iodine] Itching  . Folic Acid Itching  . Gabapentin Other (See Comments)    Makes patient feel drunk  . Iodine Hives  . Lyrica [Pregabalin] Other (See Comments)    Makes patient feel drunk  . Red Dye Itching  . Ultram [Tramadol Hcl] Nausea And Vomiting   Current Outpatient Medications on File Prior to Visit  Medication Sig Dispense Refill  . allopurinol (ZYLOPRIM) 100 MG tablet Take 1 tablet (100 mg total) by mouth daily. Ov needed (Patient taking differently: Take 200 mg by mouth daily. ) 90 tablet 3  . aspirin 325 MG tablet Take 325 mg by mouth daily.     . Blood Glucose Monitoring Suppl  (BLOOD GLUCOSE METER KIT AND SUPPLIES) KIT Dispense based on patient and insurance preference. Use up to four times daily as directed. (FOR ICD-9 250.00, 250.01). 1 each 11  . diclofenac sodium (VOLTAREN) 1 % GEL Apply 2 g topically 4 (four) times daily. (Patient taking differently: Apply 2 g topically 2 (two) times daily as needed (pain). ) 100 g 3  . DULoxetine (CYMBALTA) 60 MG capsule Take 1 capsule (60 mg total) by mouth daily. 90 capsule 1  . escitalopram (LEXAPRO) 20 MG tablet TAKE 1 TABLET (20 MG TOTAL) BY MOUTH DAILY. 30 tablet 11  . furosemide (LASIX) 20 MG tablet TAKE 1 TAB BY MOUTH ONCE DAILY 90 tablet 0  . glucose blood test strip Check sugar three times daily  Dx: DMII insulin dependent with retinopathy, neuropathy controlled 300 each 3  . insulin detemir (LEVEMIR) 100 UNIT/ML injection USE 60 UNITS AS DIRECTED AT BEDTIME dx: E11.42, D53.2992) (Patient taking differently: USE 40 UNITS AS DIRECTED AT BEDTIME dx: E11.42, E26.8341)) 20 mL 1  . Insulin Syringes, Disposable, U-100 0.5 ML MISC 28 Units by Does not apply route 2 (two) times daily. 100 each 11  . ipratropium (ATROVENT) 0.03 % nasal spray Place 2 sprays into the nose  2 (two) times daily. 30 mL 11  . leflunomide (ARAVA) 20 MG tablet     . Needles & Syringes MISC 1 Syringe by Does not apply route 2 (two) times daily. 100 each 11  . NOVOLOG 100 UNIT/ML injection INJECT 15-20 UNITS SUBCUTANEOUSLY 3 TIMES DAILY PER SLIDING SCALE 10 mL 7  . nystatin cream (MYCOSTATIN) Apply 1 application topically 2 (two) times daily. 90 g 5  . omeprazole (PRILOSEC) 20 MG capsule Take 1 capsule (20 mg total) by mouth daily. 90 capsule 3  . oxybutynin (DITROPAN XL) 15 MG 24 hr tablet Take 1 tablet (15 mg total) by mouth at bedtime. (Patient taking differently: Take 30 mg by mouth at bedtime. ) 90 tablet 3  . predniSONE (DELTASONE) 5 MG tablet Take 5 mg by mouth daily with breakfast.    . simvastatin (ZOCOR) 40 MG tablet TAKE 1 TABLET (40 MG TOTAL) BY  MOUTH DAILY AT 6 PM. 90 tablet 1  . ULTICARE INSULIN SYRINGE 31G X 5/16" 0.5 ML MISC USE AS DIRECTED TO INJECT INSULIN 2 TIMES DAILY 100 each 4  . traZODone (DESYREL) 100 MG tablet TAKE 2 TABLETS BY MOUTH EVERY DAY AT BEDTIME 180 tablet 1   No current facility-administered medications on file prior to visit.    Social History   Socioeconomic History  . Marital status: Widowed    Spouse name: Marijean Niemann  . Number of children: 2  . Years of education: college  . Highest education level: Not on file  Occupational History  . Occupation: retired    Comment: retretied  Social Needs  . Financial resource strain: Not on file  . Food insecurity:    Worry: Not on file    Inability: Not on file  . Transportation needs:    Medical: Not on file    Non-medical: Not on file  Tobacco Use  . Smoking status: Former Research scientist (life sciences)  . Smokeless tobacco: Never Used  . Tobacco comment: Quit 1987  Substance and Sexual Activity  . Alcohol use: No    Alcohol/week: 0.0 oz  . Drug use: No  . Sexual activity: Yes    Birth control/protection: Surgical, Post-menopausal    Comment: widow  Lifestyle  . Physical activity:    Days per week: Not on file    Minutes per session: Not on file  . Stress: Not on file  Relationships  . Social connections:    Talks on phone: Not on file    Gets together: Not on file    Attends religious service: Not on file    Active member of club or organization: Not on file    Attends meetings of clubs or organizations: Not on file    Relationship status: Not on file  . Intimate partner violence:    Fear of current or ex partner: Not on file    Emotionally abused: Not on file    Physically abused: Not on file    Forced sexual activity: Not on file  Other Topics Concern  . Not on file  Social History Narrative   Marital status: widowed since 2009; dating x 6 years.  Happy; no abuse.      Children: 2 children (38 daughter, 4 son estranged); 2 grandchildren.      Lives:  with boyfriend, daughter, granddaughter, friend of daughter.  Lives in pt house.      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  Diabetic retinopathy; unable to drive.  Tobacco:  Smoked x 20 years; quit 20 years.      Alcohol:  On special occasions; once per week on average.       Drugs:  None since college.      Exercise:  Walking several times per week; walks the dog.   Education college   Caffeine one cup daily.   Right handed      Advanced Directives: none; FULL CODE.  DNR/DNI.  HCPOA: [place]?           Family History  Adopted: Yes  Family history unknown: Yes       Objective:    BP 120/68 (BP Location: Left Arm, Patient Position: Sitting, Cuff Size: Large)   Pulse 93   Temp 98.6 F (37 C) (Oral)   Resp 16   Ht 5' 2.5" (1.588 m)   Wt 219 lb (99.3 kg)   SpO2 98%   BMI 39.42 kg/m  Physical Exam  Constitutional: She is oriented to person, place, and time. She appears well-developed and well-nourished. No distress.  HENT:  Head: Normocephalic and atraumatic.  Right Ear: External ear normal.  Left Ear: External ear normal.  Nose: Nose normal.  Mouth/Throat: Oropharynx is clear and moist.  Eyes: Conjunctivae and EOM are normal. Pupils are equal, round, and reactive to light.  Neck: Normal range of motion. Neck supple. Carotid bruit is not present. No thyromegaly present.  Cardiovascular: Normal rate, regular rhythm, normal heart sounds and intact distal pulses. Exam reveals no gallop and no friction rub.  No murmur heard. Pulmonary/Chest: Effort normal and breath sounds normal. She has no wheezes. She has no rales.  Abdominal: Soft. Bowel sounds are normal. She exhibits no distension and no mass. There is no tenderness. There is no rebound and no guarding.  Lymphadenopathy:    She has no cervical adenopathy.  Neurological: She is alert and oriented to person, place, and time. No cranial nerve deficit. Coordination normal.  Skin: Skin is warm and  dry. No rash noted. She is not diaphoretic. No erythema. No pallor.  Psychiatric: She has a normal mood and affect. Her behavior is normal. Judgment and thought content normal.   No results found. Depression screen [facility] 2/9 01/09/2018 12/03/2017 10/07/2017 06/28/2017 03/23/2017  Decreased Interest _0 0 0  Down, Depressed, Hopeless _1 0 0  PHQ - 2 Score _2 0 0  Altered sleeping _3 - -  Tired, decreased energy _4 - -  Change in appetite _5 - -  Feeling bad or failure about yourself  0 0 1 - -  Trouble concentrating 2 3 0 - -  Moving slowly or fidgety/restless 1 0 0 - -  Suicidal thoughts 0 0 0 - -  PHQ-9 Score _6 - -  Difficult doing work/chores Somewhat difficult Very difficult - - -   Fall Risk  01/09/2018 12/03/2017 10/07/2017 06/28/2017 03/23/2017  Falls in the past year? _7   Comment - - - - -  Number falls in past yr: 2 or more - 2 or more - 2 or more  Injury with Fall? No - No - (No Data)  Comment - - - - minor  Risk Factor Category  High Fall Risk - - - -  Risk for fall due to : History of fall(s) - - - -  Follow up Falls evaluation completed;Falls prevention discussed - - - -  Assessment & Plan:   1. Diabetic peripheral neuropathy associated with type 2 diabetes mellitus ([place])   2. Essential hypertension, benign   3. Pure hypercholesterolemia   4. Degenerative disc disease, lumbar   5. 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 Rheumatoid arthritis involving both hands with positive rheumatoid factor ([facility])   7. Chronic pain syndrome   8. Chronic renal impairment, stage 2 (mild)   9. Reactive depression     DMII: poorly controlled due to non-compliance with insulin therapy, diet, and exercise.  Encourage improved compliance with medication administration.  Grief interfering with motivation.  DDD lumbar spine/chronic pain syndrome: stable and improved with  treatment of RA; refill of hydrocodone provided.  Rheumatoid arthritis: finally improved with trial of different immunosuppressive agents.    Depression/grief reaction: ongoing sadness due to recent death of husband; counseling provided; excellent family and friend support.    Orders Placed This Encounter  Procedures  . CBC with Differential/Platelet  . Comprehensive metabolic panel    Order Specific Question:   Has the patient fasted?    Answer:   No  . Lipid panel    Order Specific Question:   Has the patient fasted?    Answer:   No  . POCT glucose (manual entry)  . POCT glycosylated hemoglobin (Hb A1C)   Meds ordered this encounter  Medications  . rosuvastatin (CRESTOR) 10 MG tablet    Sig: Take 1 tablet (10 mg total) by mouth daily.    Dispense:  90 tablet    Refill:  1  . DISCONTD: HYDROcodone-acetaminophen (NORCO) 10-325 MG tablet    Sig: Take 1 tablet by mouth every 12 (twelve) hours as needed.    Dispense:  60 tablet    Refill:  0  . DISCONTD: HYDROcodone-acetaminophen (NORCO) 10-325 MG tablet    Sig: Take 1 tablet by mouth every 12 (twelve) hours as needed.    Dispense:  60 tablet    Refill:  0    Do not fill for 30 days after prescribed  . HYDROcodone-acetaminophen (NORCO) 10-325 MG tablet    Sig: Take 1 tablet by mouth every 12 (twelve) hours as needed.    Dispense:  60 tablet    Refill:  0    Do not fill for 60 days after prescribed    Return in about 3 months (around 04/11/2018) for follow-up chronic medical conditions.   Jamire Shabazz Elayne Guerin, M.D. Primary Care at [facility] previously [facility] [place] [address] [place], [place]  [id number] [phone] phone [phone] fax

## 2018-01-09 NOTE — Patient Instructions (Addendum)
IF you received an x-ray today, you will receive an invoice from [facility]. Please contact [facility] at [phone] with questions or concerns regarding your invoice.   IF you received labwork today, you will receive an invoice from [facility]. Please contact [facility] at [phone] with questions or concerns regarding your invoice.   Our billing staff will not be able to assist you with questions regarding bills from these companies.  You will be contacted with the lab results as soon as they are available. The fastest way to get your results is to activate your My Chart account. Instructions are located on the last page of this paperwork. If you have not heard from [name] regarding the results in 2 weeks, please contact this office.     diabetes Diabetes Mellitus and Nutrition When you have diabetes (diabetes mellitus), it is very important to have healthy eating habits because your blood sugar (glucose) levels are greatly affected by what you eat and drink. Eating healthy foods in the appropriate amounts, at about the same times every day, can help you:  Control your blood glucose.  Lower your risk of heart disease.  Improve your blood pressure.  Reach or maintain a healthy weight.  Every person with diabetes is different, and each person has different needs for a meal plan. Your health care provider may recommend that you work with a diet and nutrition specialist (dietitian) to make a meal plan that is best for you. Your meal plan may vary depending on factors such as:  The calories you need.  The medicines you take.  Your weight.  Your blood glucose, blood pressure, and cholesterol levels.  Your activity level.  Other health conditions you have, such as heart or kidney disease.  How do carbohydrates affect me? Carbohydrates affect your blood glucose level more than any other type of food. Eating carbohydrates naturally increases the amount of  glucose in your blood. Carbohydrate counting is a method for keeping track of how many carbohydrates you eat. Counting carbohydrates is important to keep your blood glucose at a healthy level, especially if you use insulin or take certain oral diabetes medicines. It is important to know how many carbohydrates you can safely have in each meal. This is different for every person. Your dietitian can help you calculate how many carbohydrates you should have at each meal and for snack. Foods that contain carbohydrates include:  Bread, cereal, rice, pasta, and crackers.  Potatoes and corn.  Peas, beans, and lentils.  Milk and yogurt.  Fruit and juice.  Desserts, such as cakes, cookies, ice cream, and candy.  How does alcohol affect me? Alcohol can cause a sudden decrease in blood glucose (hypoglycemia), especially if you use insulin or take certain oral diabetes medicines. Hypoglycemia can be a life-threatening condition. Symptoms of hypoglycemia (sleepiness, dizziness, and confusion) are similar to symptoms of having too much alcohol. If your health care provider says that alcohol is safe for you, follow these guidelines:  Limit alcohol intake to no more than 1 drink per day for nonpregnant women and 2 drinks per day for men. One drink equals 12 oz of beer, 5 oz of wine, or 1 oz of hard liquor.  Do not drink on an empty stomach.  Keep yourself hydrated with water, diet soda, or unsweetened iced tea.  Keep in mind that regular soda, juice, and other mixers may contain a lot of sugar and must be counted as carbohydrates.  What are tips for  following this plan? Reading food labels  Start by checking the serving size on the label. The amount of calories, carbohydrates, fats, and other nutrients listed on the label are based on one serving of the food. Many foods contain more than one serving per package.  Check the total grams (g) of carbohydrates in one serving. You can calculate the number  of servings of carbohydrates in one serving by dividing the total carbohydrates by 15. For example, if a food has 30 g of total carbohydrates, it would be equal to 2 servings of carbohydrates.  Check the number of grams (g) of saturated and trans fats in one serving. Choose foods that have low or no amount of these fats.  Check the number of milligrams (mg) of sodium in one serving. Most people should limit total sodium intake to less than 2,300 mg per day.  Always check the nutrition information of foods labeled as "low-fat" or "nonfat". These foods may be higher in added sugar or refined carbohydrates and should be avoided.  Talk to your dietitian to identify your daily goals for nutrients listed on the label. Shopping  Avoid buying canned, premade, or processed foods. These foods tend to be high in fat, sodium, and added sugar.  Shop around the outside edge of the grocery store. This includes fresh fruits and vegetables, bulk grains, fresh meats, and fresh dairy. Cooking  Use low-heat cooking methods, such as baking, instead of high-heat cooking methods like deep frying.  Cook using healthy oils, such as olive, canola, or sunflower oil.  Avoid cooking with butter, cream, or high-fat meats. Meal planning  Eat meals and snacks regularly, preferably at the same times every day. Avoid going long periods of time without eating.  Eat foods high in fiber, such as fresh fruits, vegetables, beans, and whole grains. Talk to your dietitian about how many servings of carbohydrates you can eat at each meal.  Eat 4-6 ounces of lean protein each day, such as lean meat, chicken, fish, eggs, or tofu. 1 ounce is equal to 1 ounce of meat, chicken, or fish, 1 egg, or 1/4 cup of tofu.  Eat some foods each day that contain healthy fats, such as avocado, nuts, seeds, and fish. Lifestyle   Check your blood glucose regularly.  Exercise at least 30 minutes 5 or more days each week, or as told by your  health care provider.  Take medicines as told by your health care provider.  Do not use any products that contain nicotine or tobacco, such as cigarettes and e-cigarettes. If you need help quitting, ask your health care provider.  Work with a Social worker or diabetes educator to identify strategies to manage stress and any emotional and social challenges. What are some questions to ask my health care provider?  Do I need to meet with a diabetes educator?  Do I need to meet with a dietitian?  What number can I call if I have questions?  When are the best times to check my blood glucose? Where to find more information:  American Diabetes Association: diabetes.org/food-and-fitness/food  Academy of Nutrition and Dietetics: PokerClues.dk  [employer] of Diabetes and Digestive and Kidney Diseases (NIH): ContactWire.be Summary  A healthy meal plan will help you control your blood glucose and maintain a healthy lifestyle.  Working with a diet and nutrition specialist (dietitian) can help you make a meal plan that is best for you.  Keep in mind that carbohydrates and alcohol have immediate effects on your blood  glucose levels. It is important to count carbohydrates and to use alcohol carefully. This information is not intended to replace advice given to you by your health care provider. Make sure you discuss any questions you have with your health care provider. Document Released: 07/15/2005 Document Revised: 11/22/2016 Document Reviewed: 11/22/2016 Elsevier Interactive Patient Education  [employer].

## 2018-01-09 NOTE — Telephone Encounter (Signed)
Dr. Tamala Julian,    Angel French was just seen today can we fill this?

## 2018-01-10 LAB — CBC WITH DIFFERENTIAL/PLATELET
BASOS: 0 %
Basophils Absolute: 0 10*3/uL (ref 0.0–0.2)
EOS (ABSOLUTE): 0.2 10*3/uL (ref 0.0–0.4)
Eos: 4 %
Hematocrit: 33.1 % — ABNORMAL LOW (ref 34.0–46.6)
Hemoglobin: 11 g/dL — ABNORMAL LOW (ref 11.1–15.9)
IMMATURE GRANS (ABS): 0 10*3/uL (ref 0.0–0.1)
Immature Granulocytes: 0 %
LYMPHS ABS: 1.5 10*3/uL (ref 0.7–3.1)
LYMPHS: 28 %
MCH: 31.9 pg (ref 26.6–33.0)
MCHC: 33.2 g/dL (ref 31.5–35.7)
MCV: 96 fL (ref 79–97)
MONOS ABS: 0.4 10*3/uL (ref 0.1–0.9)
Monocytes: 8 %
NEUTROS ABS: 3.2 10*3/uL (ref 1.4–7.0)
Neutrophils: 60 %
PLATELETS: 221 10*3/uL (ref 150–379)
RBC: 3.45 x10E6/uL — ABNORMAL LOW (ref 3.77–5.28)
RDW: 14.4 % (ref 12.3–15.4)
WBC: 5.3 10*3/uL (ref 3.4–10.8)

## 2018-01-10 LAB — COMPREHENSIVE METABOLIC PANEL
A/G RATIO: 0.9 — AB (ref 1.2–2.2)
ALT: 33 IU/L — AB (ref 0–32)
AST: 30 IU/L (ref 0–40)
Albumin: 3.5 g/dL — ABNORMAL LOW (ref 3.6–4.8)
Alkaline Phosphatase: 85 IU/L (ref 39–117)
BILIRUBIN TOTAL: 0.3 mg/dL (ref 0.0–1.2)
BUN / CREAT RATIO: 23 (ref 12–28)
BUN: 23 mg/dL (ref 8–27)
CHLORIDE: 101 mmol/L (ref 96–106)
CO2: 29 mmol/L (ref 20–29)
Calcium: 9.7 mg/dL (ref 8.7–10.3)
Creatinine, Ser: 1.02 mg/dL — ABNORMAL HIGH (ref 0.57–1.00)
GFR calc non Af Amer: 57 mL/min/{1.73_m2} — ABNORMAL LOW (ref >59)
GFR, EST AFRICAN AMERICAN: 66 mL/min/{1.73_m2} (ref >59)
Globulin, Total: 3.7 g/dL (ref 1.5–4.5)
Glucose: 61 mg/dL — ABNORMAL LOW (ref 65–99)
POTASSIUM: 4.6 mmol/L (ref 3.5–5.2)
Sodium: 144 mmol/L (ref 134–144)
TOTAL PROTEIN: 7.2 g/dL (ref 6.0–8.5)

## 2018-01-10 LAB — LIPID PANEL
Chol/HDL Ratio: 5.1 ratio — ABNORMAL HIGH (ref 0.0–4.4)
Cholesterol, Total: 235 mg/dL — ABNORMAL HIGH (ref 100–199)
HDL: 46 mg/dL (ref >39)
LDL Calculated: 141 mg/dL — ABNORMAL HIGH (ref 0–99)
Triglycerides: 238 mg/dL — ABNORMAL HIGH (ref 0–149)
VLDL Cholesterol Cal: 48 mg/dL — ABNORMAL HIGH (ref 5–40)

## 2018-01-18 DIAGNOSIS — M0609 Rheumatoid arthritis without rheumatoid factor, multiple sites: Secondary | ICD-10-CM | POA: Diagnosis not present

## 2018-01-20 ENCOUNTER — Other Ambulatory Visit: Payer: Self-pay | Admitting: Family Medicine

## 2018-01-30 ENCOUNTER — Other Ambulatory Visit: Payer: Self-pay

## 2018-01-30 ENCOUNTER — Encounter: Payer: Self-pay | Admitting: Family Medicine

## 2018-01-30 ENCOUNTER — Ambulatory Visit (INDEPENDENT_AMBULATORY_CARE_PROVIDER_SITE_OTHER): Payer: Medicare Other | Admitting: Family Medicine

## 2018-01-30 VITALS — BP 142/72 | HR 92 | Temp 97.7°F | Resp 16 | Ht 62.21 in | Wt 223.0 lb

## 2018-01-30 DIAGNOSIS — R200 Anesthesia of skin: Secondary | ICD-10-CM | POA: Diagnosis not present

## 2018-01-30 DIAGNOSIS — R29818 Other symptoms and signs involving the nervous system: Secondary | ICD-10-CM

## 2018-01-30 DIAGNOSIS — G459 Transient cerebral ischemic attack, unspecified: Secondary | ICD-10-CM

## 2018-01-30 DIAGNOSIS — G4733 Obstructive sleep apnea (adult) (pediatric): Secondary | ICD-10-CM | POA: Diagnosis not present

## 2018-01-30 DIAGNOSIS — Z794 Long term (current) use of insulin: Secondary | ICD-10-CM

## 2018-01-30 DIAGNOSIS — E1142 Type 2 diabetes mellitus with diabetic polyneuropathy: Secondary | ICD-10-CM | POA: Diagnosis not present

## 2018-01-30 DIAGNOSIS — I10 Essential (primary) hypertension: Secondary | ICD-10-CM | POA: Diagnosis not present

## 2018-01-30 DIAGNOSIS — F329 Major depressive disorder, single episode, unspecified: Secondary | ICD-10-CM

## 2018-01-30 NOTE — Patient Instructions (Addendum)
IF you received an x-ray today, you will receive an invoice from [facility]. Please contact [facility] at [phone] with questions or concerns regarding your invoice.   IF you received labwork today, you will receive an invoice from [place]. Please contact [facility] at [phone] with questions or concerns regarding your invoice.   Our billing staff will not be able to assist you with questions regarding bills from these companies.  You will be contacted with the lab results as soon as they are available. The fastest way to get your results is to activate your My Chart account. Instructions are located on the last page of this paperwork. If you have not heard from [name] regarding the results in 2 weeks, please contact this office.      Transient Ischemic Attack A transient ischemic attack (TIA) is a "warning stroke" that causes stroke-like symptoms. A TIA does not cause lasting damage to the brain. The symptoms of a TIA can happen fast and do not last long. It is important to know the symptoms of a TIA and what to do. This can help prevent stroke or death. Follow these instructions at home:  Take medicines only as told by your doctor. Make sure you understand all of the instructions.  You may need to take aspirin or warfarin medicine. Warfarin needs to be taken exactly as told. ? Taking too much or too little warfarin is dangerous. Blood tests must be done as often as told by your doctor. A PT blood test measures how long it takes for blood to clot. Your PT is used to calculate another value called an INR. Your PT and INR help your doctor adjust your warfarin dosage. He or she will make sure you are taking the right amount. ? Food can cause problems with warfarin and affect the results of your blood tests. This is true for foods high in vitamin K. Eat the same amount of foods high in vitamin K each day. Foods high in vitamin K include spinach, kale, broccoli, cabbage,  collard and turnip greens, Brussels sprouts, peas, cauliflower, seaweed, and parsley. Other foods high in vitamin K include beef and pork liver, green tea, and soybean oil. Eat the same amount of foods high in vitamin K each day. Avoid big changes in your diet. Tell your doctor before changing your diet. Talk to a food specialist (dietitian) if you have questions. ? Many medicines can cause problems with warfarin and affect your PT and INR. Tell your doctor about all medicines you take. This includes vitamins and dietary pills (supplements). Do not take or stop taking any prescribed or over-the-counter medicines unless your doctor tells you to. ? Warfarin can cause more bruising or bleeding. Hold pressure over any cuts for longer than normal. Talk to your doctor about other side effects of warfarin. ? Avoid sports or activities that may cause injury or bleeding. ? Be careful when you shave, floss, or use sharp objects. ? Avoid or drink very little alcohol while taking warfarin. Tell your doctor if you change how much alcohol you drink. ? Tell your dentist and other doctors that you take warfarin before any procedures.  Follow your diet program as told, if you are given one.  Keep a healthy weight.  Stay active. Try to get at least 30 minutes of activity on all or most days.  Do not use any tobacco products, including cigarettes, chewing tobacco, or electronic cigarettes. If you need help quitting, ask your doctor.  Limit alcohol intake to no more than 1 drink per day for nonpregnant women and 2 drinks per day for men. One drink equals 12 ounces of beer, 5 ounces of wine, or 1 ounces of hard liquor.  Do not abuse drugs.  Keep your home safe so you do not fall. You can do this by: ? Putting grab bars in the bedroom and bathroom. ? Raising toilet seats. ? Putting a seat in the shower.  Keep all follow-up visits as told by your doctor. This is important. Contact a doctor if:  Your  personality changes.  You have trouble swallowing.  You have double vision.  You are dizzy.  You have a fever. Get help right away if: These symptoms may be an emergency. Do not wait to see if the symptoms will go away. Get medical help right away. Call your local emergency services (911 in the [place] Do not drive yourself to the hospital.  You have sudden weakness or lose feeling (go numb), especially on one side of the body. This can affect your: ? Face. ? Arm. ? Leg.  You have sudden trouble walking.  You have sudden trouble moving your arms or legs.  You have sudden confusion.  You have trouble talking.  You have trouble understanding.  You have sudden trouble seeing in one or both eyes.  You lose your balance.  Your movements are not smooth.  You have a sudden, very bad headache with no known cause.  You have new chest pain.  Your heartbeat is unsteady.  You are partly or totally unaware of what is going on around you.  This information is not intended to replace advice given to you by your health care provider. Make sure you discuss any questions you have with your health care provider. Document Released: 07/27/2008 Document Revised: 06/21/2016 Document Reviewed: 01/23/2014 Elsevier Interactive Patient Education  [employer].

## 2018-01-30 NOTE — Progress Notes (Signed)
Subjective:    Patient ID: Angel French, female    DOB: [birth year], 67 y.o.   MRN: [id number]  01/30/2018  Numbness (pt states she feeling some numbness and drooping to the right side of face. Pt states she has been having this feeling for 2 weeks. )    HPI This 67 y.o. female presents with daughter for evaluation of numbness, facial drooping for two weeks.   Flash of light in LEFT eye with acute onset of headache, R facial numbness; drooling out of R side of mouth. HA duration 3 days.  Numbness in face resolved in 48 hours.  No weakness in extremities or numbness in extremities; more unsteady; dizziness chronic without acute worsening.  Migraine hx; since menopause, only 1-2 per year; no history of complex migraines. No nausea, photophobia, phonophobia with headache, flash of light, R facial numbness.  Daughter now notices very subtle asymmetry of smile.  Daughter has not noticed facial droop for the past 2 weeks.  Denies diplopia or blurred vision.  Daughter note patient has noticed inability to completely close eye.  Sugars have improved with improved compliance with insulin therapy.  Did not check sugar with acute onset of symptoms.  Patient was eating lunch with friends out of the home.  Denies chest pain, palpitations, shortness of breath, leg swelling.  Patient is curious if stress could cause symptoms.  Patient continues to suffer husband in December 2018 patient has noticed a constant drool from the right corner of mouth..     BP Readings from Last 3 Encounters:  01/30/18 (!) 142/72  01/09/18 120/68  12/06/17 128/78   Wt Readings from Last 3 Encounters:  01/30/18 223 lb (101.2 kg)  01/09/18 219 lb (99.3 kg)  12/06/17 213 lb (96.6 kg)   Immunization History  Administered Date(s) Administered  . Hepatitis A, Adult 10/06/2015, 08/12/2016  . Influenza,inj,Quad PF,6+ Mos 07/16/2013, 07/10/2014, 10/15/2015, 08/12/2016, 06/28/2017  . Pneumococcal Conjugate-13 03/23/2017  .  Pneumococcal Polysaccharide-23 07/10/2014  . Tdap 10/06/2015    Review of Systems  Constitutional: Negative for chills, diaphoresis, fatigue and fever.  Eyes: Positive for visual disturbance. Negative for photophobia.  Respiratory: Negative for cough and shortness of breath.   Cardiovascular: Negative for chest pain, palpitations and leg swelling.  Gastrointestinal: Negative for abdominal pain, constipation, diarrhea, nausea and vomiting.  Endocrine: Negative for cold intolerance, heat intolerance, polydipsia, polyphagia and polyuria.  Neurological: Positive for weakness, numbness and headaches. Negative for dizziness, tremors, seizures, syncope, facial asymmetry, speech difficulty and light-headedness.  Psychiatric/Behavioral: Positive for dysphoric mood. Negative for confusion.    Past Medical History:  Diagnosis Date  . Allergy    generic allergy pill; Spring and Fall only.  . Anxiety   . Arthritis    DDD lumbar, R hip OA.  s/p ortho consult in past.  . Blood transfusion without reported diagnosis    Mountain climbing accident in [place].  . Brachial plexus disorders   . Cataract    B retractions.  . Chronic kidney disease    stage 3 per pt.   . Depression   . Diabetes mellitus   . Diabetic peripheral neuropathy associated with type 2 diabetes mellitus ([place])   . Diabetic retinopathy ([place])   . Diabetic retinopathy associated with type 2 diabetes mellitus ([place])    s/p laser treatment multiple.  Unable to drive.  Marland Kitchen GERD (gastroesophageal reflux disease)   . Hyperlipidemia   . Hypertension    controlled, off meds   . Neuromuscular disorder (  [place])   . Rheumatoid arthritis ([place])   . Ulcer    Peptic ulcer H. Pylori + s/p treatment.  Upper GI diagnosed.[name] Prilosec PRN .   Past Surgical History:  Procedure Laterality Date  .  2 SPINAL INJECTIONS     . ABDOMINAL HYSTERECTOMY  11/02/1979   DUB; cervical dysplasia; ovaries intact.  . ABDOMINAL SURGERY     staph abcess   .  Behavioral [facility] Admission     age 36; three months in [place].  Marland Kitchen BREAST BIOPSY    . CARDIAC CATHETERIZATION  11/02/2007   normal coronary arteries.  . CARPAL TUNNEL RELEASE     Bilateral.  . CATARACT EXTRACTION, BILATERAL    . CHOLECYSTECTOMY    . ESOPHAGEAL MANOMETRY N/A 09/14/2017   Procedure: ESOPHAGEAL MANOMETRY (EM);  Surgeon: Ronnette Juniper, MD;  Location: [facility];  Service: Gastroenterology;  Laterality: N/A;  . EYE SURGERY     Cataracts B. Laser surgery x 7 for Diabetic Retinopathy  . TONSILLECTOMY     Allergies  Allergen Reactions  . Codeine Anaphylaxis  . Contrast Media [Iodinated Diagnostic Agents] Anaphylaxis  . Nitrofurantoin Monohyd Macro Anaphylaxis  . Betadine [Povidone Iodine] Itching  . Folic Acid Itching  . Gabapentin Other (See Comments)    Makes patient feel drunk  . Iodine Hives  . Lyrica [Pregabalin] Other (See Comments)    Makes patient feel drunk  . Red Dye Itching  . Ultram [Tramadol Hcl] Nausea And Vomiting   Current Outpatient Medications on File Prior to Visit  Medication Sig Dispense Refill  . allopurinol (ZYLOPRIM) 100 MG tablet Take 1 tablet (100 mg total) by mouth daily. Ov needed (Patient taking differently: Take 200 mg by mouth daily. ) 90 tablet 3  . aspirin 325 MG tablet Take 325 mg by mouth daily.     . Blood Glucose Monitoring Suppl (BLOOD GLUCOSE METER KIT AND SUPPLIES) KIT Dispense based on patient and insurance preference. Use up to four times daily as directed. (FOR ICD-9 250.00, 250.01). 1 each 11  . diclofenac sodium (VOLTAREN) 1 % GEL Apply 2 g topically 4 (four) times daily. (Patient taking differently: Apply 2 g topically 2 (two) times daily as needed (pain). ) 100 g 3  . DULoxetine (CYMBALTA) 30 MG capsule TAKE 1 CAPSULE BY MOUTH EVERY DAY 90 capsule 0  . DULoxetine (CYMBALTA) 60 MG capsule Take 1 capsule (60 mg total) by mouth daily. 90 capsule 1  . escitalopram (LEXAPRO) 20 MG tablet TAKE 1 TABLET (20 MG TOTAL) BY MOUTH  DAILY. 30 tablet 11  . furosemide (LASIX) 20 MG tablet TAKE 1 TAB BY MOUTH ONCE DAILY 90 tablet 0  . glucose blood test strip Check sugar three times daily  Dx: DMII insulin dependent with retinopathy, neuropathy controlled 300 each 3  . HYDROcodone-acetaminophen (NORCO) 10-325 MG tablet Take 1 tablet by mouth every 12 (twelve) hours as needed. 60 tablet 0  . insulin detemir (LEVEMIR) 100 UNIT/ML injection USE 60 UNITS AS DIRECTED AT BEDTIME dx: E11.42, W29.9371) (Patient taking differently: USE 40 UNITS AS DIRECTED AT BEDTIME dx: E11.42, I96.7893)) 20 mL 1  . Insulin Syringes, Disposable, U-100 0.5 ML MISC 28 Units by Does not apply route 2 (two) times daily. 100 each 11  . ipratropium (ATROVENT) 0.03 % nasal spray Place 2 sprays into the nose 2 (two) times daily. 30 mL 11  . leflunomide (ARAVA) 20 MG tablet     . Needles & Syringes MISC 1 Syringe by  Does not apply route 2 (two) times daily. 100 each 11  . NOVOLOG 100 UNIT/ML injection INJECT 15-20 UNITS SUBCUTANEOUSLY 3 TIMES DAILY PER SLIDING SCALE 10 mL 7  . nystatin cream (MYCOSTATIN) Apply 1 application topically 2 (two) times daily. 90 g 5  . omeprazole (PRILOSEC) 20 MG capsule Take 1 capsule (20 mg total) by mouth daily. 90 capsule 3  . oxybutynin (DITROPAN XL) 15 MG 24 hr tablet Take 1 tablet (15 mg total) by mouth at bedtime. (Patient taking differently: Take 30 mg by mouth at bedtime. ) 90 tablet 3  . predniSONE (DELTASONE) 5 MG tablet Take 5 mg by mouth daily with breakfast.    . rosuvastatin (CRESTOR) 10 MG tablet Take 1 tablet (10 mg total) by mouth daily. 90 tablet 1  . simvastatin (ZOCOR) 40 MG tablet TAKE 1 TABLET (40 MG TOTAL) BY MOUTH DAILY AT 6 PM. 90 tablet 1  . traZODone (DESYREL) 100 MG tablet TAKE 2 TABLETS BY MOUTH EVERY DAY AT BEDTIME 180 tablet 1  . ULTICARE INSULIN SYRINGE 31G X 5/16" 0.5 ML MISC USE AS DIRECTED TO INJECT INSULIN 2 TIMES DAILY 100 each 4   No current facility-administered medications on file prior to  visit.    Social History   Socioeconomic History  . Marital status: Widowed    Spouse name: Marijean Niemann  . Number of children: 2  . Years of education: college  . Highest education level: Not on file  Occupational History  . Occupation: retired    Comment: retretied  Social Needs  . Financial resource strain: Not on file  . Food insecurity:    Worry: Not on file    Inability: Not on file  . Transportation needs:    Medical: Not on file    Non-medical: Not on file  Tobacco Use  . Smoking status: Former Research scientist (life sciences)  . Smokeless tobacco: Never Used  . Tobacco comment: Quit 1987  Substance and Sexual Activity  . Alcohol use: No    Alcohol/week: 0.0 oz  . Drug use: No  . Sexual activity: Yes    Birth control/protection: Surgical, Post-menopausal    Comment: widow  Lifestyle  . Physical activity:    Days per week: Not on file    Minutes per session: Not on file  . Stress: Not on file  Relationships  . Social connections:    Talks on phone: Not on file    Gets together: Not on file    Attends religious service: Not on file    Active member of club or organization: Not on file    Attends meetings of clubs or organizations: Not on file    Relationship status: Not on file  . Intimate partner violence:    Fear of current or ex partner: Not on file    Emotionally abused: Not on file    Physically abused: Not on file    Forced sexual activity: Not on file  Other Topics Concern  . Not on file  Social History Narrative   Marital status: widowed since 2009; dating x 6 years.  Happy; no abuse.      Children: 2 children (50 daughter, 28 son estranged); 2 grandchildren.      Lives: with boyfriend, daughter, granddaughter, friend of daughter.  Lives in pt house.      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  Diabetic retinopathy; unable to drive.      Tobacco:  Smoked x 20 years; quit  20 years.      Alcohol:  On special occasions; once per week on average.        Drugs:  None since college.      Exercise:  Walking several times per week; walks the dog.   Education college   Caffeine one cup daily.   Right handed      Advanced Directives: none; FULL CODE.  DNR/DNI.  HCPOA: [place]?           Family History  Adopted: Yes  Family history unknown: Yes       Objective:    BP (!) 142/72   Pulse 92   Temp 97.7 F (36.5 C) (Oral)   Resp 16   Ht 5' 2.21" (1.58 m)   Wt 223 lb (101.2 kg)   SpO2 97%   BMI 40.52 kg/m  Physical Exam  Constitutional: She is oriented to person, place, and time. She appears well-developed and well-nourished. No distress.  HENT:  Head: Normocephalic and atraumatic.  Right Ear: External ear normal.  Left Ear: External ear normal.  Nose: Nose normal.  Mouth/Throat: Oropharynx is clear and moist.  Eyes: Pupils are equal, round, and reactive to light. Conjunctivae and EOM are normal.  Neck: Normal range of motion. Neck supple. Carotid bruit is not present. No thyromegaly present.  Cardiovascular: Normal rate, regular rhythm, normal heart sounds and intact distal pulses. Exam reveals no gallop and no friction rub.  No murmur heard. Pulmonary/Chest: Effort normal and breath sounds normal. She has no wheezes. She has no rales.  Abdominal: Soft. Bowel sounds are normal. She exhibits no distension and no mass. There is no tenderness. There is no rebound and no guarding.  Lymphadenopathy:    She has no cervical adenopathy.  Neurological: She is alert and oriented to person, place, and time. She displays no tremor. No cranial nerve deficit or sensory deficit. She exhibits normal muscle tone. She displays a negative Romberg sign. Coordination normal.  Mild asymmetry when I have patient blow her cheeks out with more prominent fullness on the left in comparison to right cheek.  Finger to nose intact.  Smile is symmetric.  Skin: Skin is warm and dry. No rash noted. She is not diaphoretic. No erythema. No pallor.  Psychiatric:  She has a normal mood and affect. Her behavior is normal.   No results found. Depression screen [facility] 2/9 01/09/2018 12/03/2017 10/07/2017 06/28/2017 03/23/2017  Decreased Interest '2 3 1 '$ 0 0  Down, Depressed, Hopeless '3 3 1 '$ 0 0  PHQ - 2 Score '5 6 2 '$ 0 0  Altered sleeping '1 3 1 '$ - -  Tired, decreased energy '3 3 1 '$ - -  Change in appetite '2 3 1 '$ - -  Feeling bad or failure about yourself  0 0 1 - -  Trouble concentrating 2 3 0 - -  Moving slowly or fidgety/restless 1 0 0 - -  Suicidal thoughts 0 0 0 - -  PHQ-9 Score '14 18 6 '$ - -  Difficult doing work/chores Somewhat difficult Very difficult - - -   Fall Risk  01/30/2018 01/09/2018 12/03/2017 10/07/2017 06/28/2017  Falls in the past year? Yes Yes Yes Yes Yes  Comment - - - - -  Number falls in past yr: 2 or more 2 or more - 2 or more -  Injury with Fall? No No - No -  Comment - - - - -  Risk Factor Category  High Fall Risk High Fall Risk - - -  Risk for fall due to : - History of fall(s) - - -  Follow up - Falls evaluation completed;Falls prevention discussed - - -        Assessment & Plan:   1. Right facial numbness   2. Type 2 diabetes mellitus with diabetic polyneuropathy, with long-term current use of insulin ([place])   3. TIA (transient ischemic attack)   4. Other symptoms and signs involving the nervous system   5. Essential hypertension, benign   6. OSA (obstructive sleep apnea)   7. Reactive depression     Acute onset of right facial numbness associated with headache and scotoma: Onset 2 weeks ago with resolution of symptoms other than drooling from the right corner of mouth.  Differential diagnosis includes complex migraine, Bell's palsy, TIA versus CVA.  Overall, neurologically intact with possible subtle deficit of right facial region.  Obtain MRI of brain, carotid Dopplers, echocardiogram.  Obtain labs.  Obtain EKG in office.  Refer to neurology for further care.  Continue aspirin therapy 325 mg at this time.  To emergency department for  recurrent symptoms.  Patient with multiple comorbidities including diabetes, hypercholesterolemia, obesity. -prolonged face-to-face for 40 minutes with greater than 50% of time dedicated to counseling and coordination of care.  Orders Placed This Encounter  Procedures  . MR Brain Wo Contrast    Standing Status:   Future    Standing Expiration Date:   04/02/2019    Order Specific Question:   What is the patient's sedation requirement?    Answer:   Anti-anxiety    Order Specific Question:   Does the patient have a pacemaker or implanted devices?    Answer:   No    Order Specific Question:   Preferred imaging location?    Answer:   [facility] (table limit-550lbs)    Order Specific Question:   Radiology Contrast Protocol - do NOT remove file path    Answer:   [email or web address]  . CBC with Differential/Platelet  . Comprehensive metabolic panel  . TSH  . Vitamin B12  . Ambulatory referral to Neurology    Referral Priority:   Routine    Referral Type:   Consultation    Referral Reason:   Specialty Services Required    Requested Specialty:   Neurology    Number of Visits Requested:   1  . EKG 12-Lead  . ECHOCARDIOGRAM COMPLETE    Standing Status:   Future    Standing Expiration Date:   05/02/2019    Order Specific Question:   Where should this test be performed    Answer:   [facility]    Order Specific Question:   Perflutren DEFINITY (image enhancing agent) should be administered unless hypersensitivity or allergy exist    Answer:   Do NOT administer Perflutren    Order Specific Question:   Reason for no Perflutren    Answer:   Known hypersensitivity/allergy    Order Specific Question:   Expected Date:    Answer:   1 week   No orders of the defined types were placed in this encounter.   No follow-ups on file.   Ghazi Rumpf Elayne Guerin, M.D. Primary Care at [facility] [address] [place], [place]  [address] [phone] phone [phone] fax

## 2018-01-31 LAB — COMPREHENSIVE METABOLIC PANEL
A/G RATIO: 1.2 (ref 1.2–2.2)
ALBUMIN: 4.2 g/dL (ref 3.6–4.8)
ALT: 32 IU/L (ref 0–32)
AST: 33 IU/L (ref 0–40)
Alkaline Phosphatase: 91 IU/L (ref 39–117)
BILIRUBIN TOTAL: 0.4 mg/dL (ref 0.0–1.2)
BUN / CREAT RATIO: 18 (ref 12–28)
BUN: 26 mg/dL (ref 8–27)
CALCIUM: 9 mg/dL (ref 8.7–10.3)
CHLORIDE: 103 mmol/L (ref 96–106)
CO2: 19 mmol/L — ABNORMAL LOW (ref 20–29)
Creatinine, Ser: 1.42 mg/dL — ABNORMAL HIGH (ref 0.57–1.00)
GFR, EST AFRICAN AMERICAN: 44 mL/min/{1.73_m2} — AB (ref >59)
GFR, EST NON AFRICAN AMERICAN: 39 mL/min/{1.73_m2} — AB (ref >59)
Globulin, Total: 3.4 g/dL (ref 1.5–4.5)
Glucose: 91 mg/dL (ref 65–99)
POTASSIUM: 4.5 mmol/L (ref 3.5–5.2)
Sodium: 147 mmol/L — ABNORMAL HIGH (ref 134–144)
TOTAL PROTEIN: 7.6 g/dL (ref 6.0–8.5)

## 2018-01-31 LAB — CBC WITH DIFFERENTIAL/PLATELET
BASOS: 0 %
Basophils Absolute: 0 10*3/uL (ref 0.0–0.2)
EOS (ABSOLUTE): 0.3 10*3/uL (ref 0.0–0.4)
EOS: 6 %
HEMOGLOBIN: 10.9 g/dL — AB (ref 11.1–15.9)
Hematocrit: 30.8 % — ABNORMAL LOW (ref 34.0–46.6)
IMMATURE GRANS (ABS): 0 10*3/uL (ref 0.0–0.1)
Immature Granulocytes: 0 %
LYMPHS: 33 %
Lymphocytes Absolute: 1.7 10*3/uL (ref 0.7–3.1)
MCH: 32.2 pg (ref 26.6–33.0)
MCHC: 35.4 g/dL (ref 31.5–35.7)
MCV: 91 fL (ref 79–97)
MONOCYTES: 10 %
Monocytes Absolute: 0.5 10*3/uL (ref 0.1–0.9)
Neutrophils Absolute: 2.6 10*3/uL (ref 1.4–7.0)
Neutrophils: 51 %
Platelets: 226 10*3/uL (ref 150–379)
RBC: 3.38 x10E6/uL — ABNORMAL LOW (ref 3.77–5.28)
RDW: 14.8 % (ref 12.3–15.4)
WBC: 5.1 10*3/uL (ref 3.4–10.8)

## 2018-01-31 LAB — VITAMIN B12: Vitamin B-12: 466 pg/mL (ref 232–1245)

## 2018-01-31 LAB — TSH: TSH: 0.874 u[IU]/mL (ref 0.450–4.500)

## 2018-02-01 ENCOUNTER — Ambulatory Visit (HOSPITAL_COMMUNITY)
Admission: RE | Admit: 2018-02-01 | Discharge: 2018-02-01 | Disposition: A | Discharge status: Home / Self Care | Payer: Medicare Other | Source: Ambulatory Visit | Attending: Cardiovascular Disease | Admitting: Cardiovascular Disease

## 2018-02-01 ENCOUNTER — Other Ambulatory Visit: Payer: Self-pay | Admitting: Family Medicine

## 2018-02-01 DIAGNOSIS — I6523 Occlusion and stenosis of bilateral carotid arteries: Secondary | ICD-10-CM

## 2018-02-01 DIAGNOSIS — G459 Transient cerebral ischemic attack, unspecified: Secondary | ICD-10-CM | POA: Diagnosis not present

## 2018-02-01 DIAGNOSIS — R200 Anesthesia of skin: Secondary | ICD-10-CM

## 2018-02-04 ENCOUNTER — Ambulatory Visit
Admission: RE | Admit: 2018-02-04 | Discharge: 2018-02-04 | Disposition: A | Discharge status: Home / Self Care | Payer: Medicare Other | Source: Ambulatory Visit | Attending: Family Medicine | Admitting: Family Medicine

## 2018-02-04 DIAGNOSIS — R29818 Other symptoms and signs involving the nervous system: Secondary | ICD-10-CM

## 2018-02-04 DIAGNOSIS — R202 Paresthesia of skin: Secondary | ICD-10-CM | POA: Diagnosis not present

## 2018-02-13 ENCOUNTER — Ambulatory Visit (HOSPITAL_COMMUNITY): Payer: Medicare Other | Attending: Cardiology

## 2018-02-13 ENCOUNTER — Other Ambulatory Visit: Payer: Self-pay

## 2018-02-13 DIAGNOSIS — I348 Other nonrheumatic mitral valve disorders: Secondary | ICD-10-CM | POA: Insufficient documentation

## 2018-02-13 DIAGNOSIS — I10 Essential (primary) hypertension: Secondary | ICD-10-CM | POA: Insufficient documentation

## 2018-02-13 DIAGNOSIS — E669 Obesity, unspecified: Secondary | ICD-10-CM | POA: Insufficient documentation

## 2018-02-13 DIAGNOSIS — G459 Transient cerebral ischemic attack, unspecified: Secondary | ICD-10-CM

## 2018-02-13 DIAGNOSIS — R200 Anesthesia of skin: Secondary | ICD-10-CM | POA: Insufficient documentation

## 2018-02-13 DIAGNOSIS — E119 Type 2 diabetes mellitus without complications: Secondary | ICD-10-CM | POA: Insufficient documentation

## 2018-02-13 DIAGNOSIS — Z87891 Personal history of nicotine dependence: Secondary | ICD-10-CM | POA: Diagnosis not present

## 2018-02-13 DIAGNOSIS — E785 Hyperlipidemia, unspecified: Secondary | ICD-10-CM | POA: Insufficient documentation

## 2018-02-18 ENCOUNTER — Other Ambulatory Visit: Payer: Self-pay | Admitting: Family Medicine

## 2018-02-23 ENCOUNTER — Other Ambulatory Visit: Payer: Self-pay | Admitting: Family Medicine

## 2018-02-28 DIAGNOSIS — M0609 Rheumatoid arthritis without rheumatoid factor, multiple sites: Secondary | ICD-10-CM | POA: Diagnosis not present

## 2018-02-28 DIAGNOSIS — E669 Obesity, unspecified: Secondary | ICD-10-CM | POA: Diagnosis not present

## 2018-02-28 DIAGNOSIS — Z79899 Other long term (current) drug therapy: Secondary | ICD-10-CM | POA: Diagnosis not present

## 2018-02-28 DIAGNOSIS — Z6839 Body mass index (BMI) 39.0-39.9, adult: Secondary | ICD-10-CM | POA: Diagnosis not present

## 2018-02-28 DIAGNOSIS — R682 Dry mouth, unspecified: Secondary | ICD-10-CM | POA: Diagnosis not present

## 2018-02-28 DIAGNOSIS — M2550 Pain in unspecified joint: Secondary | ICD-10-CM | POA: Diagnosis not present

## 2018-02-28 DIAGNOSIS — M109 Gout, unspecified: Secondary | ICD-10-CM | POA: Diagnosis not present

## 2018-02-28 DIAGNOSIS — M791 Myalgia, unspecified site: Secondary | ICD-10-CM | POA: Diagnosis not present

## 2018-02-28 DIAGNOSIS — M5136 Other intervertebral disc degeneration, lumbar region: Secondary | ICD-10-CM | POA: Diagnosis not present

## 2018-03-01 DIAGNOSIS — M0609 Rheumatoid arthritis without rheumatoid factor, multiple sites: Secondary | ICD-10-CM | POA: Diagnosis not present

## 2018-03-02 ENCOUNTER — Encounter (INDEPENDENT_AMBULATORY_CARE_PROVIDER_SITE_OTHER): Payer: Medicare Other

## 2018-03-07 ENCOUNTER — Ambulatory Visit (INDEPENDENT_AMBULATORY_CARE_PROVIDER_SITE_OTHER): Payer: Medicare Other | Admitting: Family Medicine

## 2018-03-07 ENCOUNTER — Encounter (INDEPENDENT_AMBULATORY_CARE_PROVIDER_SITE_OTHER): Payer: Self-pay | Admitting: Family Medicine

## 2018-03-07 VITALS — BP 122/74 | HR 70 | Temp 98.6°F | Ht 62.0 in | Wt 224.0 lb

## 2018-03-07 DIAGNOSIS — Z794 Long term (current) use of insulin: Secondary | ICD-10-CM

## 2018-03-07 DIAGNOSIS — Z6841 Body Mass Index (BMI) 40.0 and over, adult: Secondary | ICD-10-CM | POA: Diagnosis not present

## 2018-03-07 DIAGNOSIS — E1122 Type 2 diabetes mellitus with diabetic chronic kidney disease: Secondary | ICD-10-CM

## 2018-03-07 DIAGNOSIS — R0602 Shortness of breath: Secondary | ICD-10-CM | POA: Diagnosis not present

## 2018-03-07 DIAGNOSIS — Z0289 Encounter for other administrative examinations: Secondary | ICD-10-CM

## 2018-03-07 DIAGNOSIS — R5383 Other fatigue: Secondary | ICD-10-CM

## 2018-03-07 DIAGNOSIS — Z1331 Encounter for screening for depression: Secondary | ICD-10-CM | POA: Diagnosis not present

## 2018-03-07 DIAGNOSIS — E559 Vitamin D deficiency, unspecified: Secondary | ICD-10-CM

## 2018-03-07 DIAGNOSIS — N183 Chronic kidney disease, stage 3 unspecified: Secondary | ICD-10-CM

## 2018-03-08 ENCOUNTER — Other Ambulatory Visit: Payer: Self-pay | Admitting: Family Medicine

## 2018-03-08 LAB — COMPREHENSIVE METABOLIC PANEL
A/G RATIO: 1.2 (ref 1.2–2.2)
ALBUMIN: 4.1 g/dL (ref 3.6–4.8)
ALK PHOS: 87 IU/L (ref 39–117)
ALT: 36 IU/L — ABNORMAL HIGH (ref 0–32)
AST: 40 IU/L (ref 0–40)
BUN / CREAT RATIO: 19 (ref 12–28)
BUN: 26 mg/dL (ref 8–27)
Bilirubin Total: 0.3 mg/dL (ref 0.0–1.2)
CO2: 28 mmol/L (ref 20–29)
Calcium: 9.4 mg/dL (ref 8.7–10.3)
Chloride: 97 mmol/L (ref 96–106)
Creatinine, Ser: 1.36 mg/dL — ABNORMAL HIGH (ref 0.57–1.00)
GFR calc Af Amer: 47 mL/min/{1.73_m2} — ABNORMAL LOW (ref >59)
GFR, EST NON AFRICAN AMERICAN: 41 mL/min/{1.73_m2} — AB (ref >59)
Globulin, Total: 3.3 g/dL (ref 1.5–4.5)
Glucose: 111 mg/dL — ABNORMAL HIGH (ref 65–99)
POTASSIUM: 3.7 mmol/L (ref 3.5–5.2)
SODIUM: 140 mmol/L (ref 134–144)
Total Protein: 7.4 g/dL (ref 6.0–8.5)

## 2018-03-08 LAB — LIPID PANEL WITH LDL/HDL RATIO
Cholesterol, Total: 168 mg/dL (ref 100–199)
HDL: 40 mg/dL (ref >39)
LDL Calculated: 81 mg/dL (ref 0–99)
LDL/HDL RATIO: 2 ratio (ref 0.0–3.2)
TRIGLYCERIDES: 237 mg/dL — AB (ref 0–149)
VLDL Cholesterol Cal: 47 mg/dL — ABNORMAL HIGH (ref 5–40)

## 2018-03-08 LAB — HEMOGLOBIN A1C
ESTIMATED AVERAGE GLUCOSE: 186 mg/dL
Hgb A1c MFr Bld: 8.1 % — ABNORMAL HIGH (ref 4.8–5.6)

## 2018-03-08 LAB — T4, FREE: Free T4: 1.16 ng/dL (ref 0.82–1.77)

## 2018-03-08 LAB — VITAMIN D 25 HYDROXY (VIT D DEFICIENCY, FRACTURES): Vit D, 25-Hydroxy: 54.8 ng/mL (ref 30.0–100.0)

## 2018-03-08 LAB — T3: T3, Total: 114 ng/dL (ref 71–180)

## 2018-03-08 LAB — MICROALBUMIN / CREATININE URINE RATIO
Creatinine, Urine: 93.3 mg/dL
MICROALB/CREAT RATIO: 10.9 mg/g{creat} (ref 0.0–30.0)
Microalbumin, Urine: 10.2 ug/mL

## 2018-03-08 LAB — INSULIN, RANDOM: INSULIN: 6.2 u[IU]/mL (ref 2.6–24.9)

## 2018-03-08 LAB — TSH: TSH: 0.958 u[IU]/mL (ref 0.450–4.500)

## 2018-03-08 NOTE — Progress Notes (Signed)
Office: [phone]  /  Fax: [phone]   Dear Dr. Brett Fairy,   Thank you for referring Angel French to our clinic. The following note includes my evaluation and treatment recommendations.  HPI:   Chief Complaint: OBESITY    Angel French has been referred by Larey Seat, MD for consultation regarding her obesity and obesity related comorbidities.    Angel French (MR# [id number]) is a 67 y.o. female who presents on 03/08/2018 for obesity evaluation and treatment. Current BMI is Body mass index is 40.97 kg/m.Marland Kitchen Angel French has been struggling with her weight for many years and has been unsuccessful in either losing weight, maintaining weight loss, or reaching her healthy weight goal.     Angel French attended our information session and states she is currently in the action stage of change and ready to dedicate time achieving and maintaining a healthier weight. Angel French is interested in becoming our Angel French and working on intensive lifestyle modifications including (but not limited to) diet, exercise and weight loss.    Angel French states she struggles with family and or coworkers weight loss sabotage her desired weight loss is 46 to 76 lbs she has been heavy most of  her life she started gaining weight over the last 12 yrs or so her heaviest weight ever was 335 lbs. she has significant food cravings issues  she snacks frequently in the evenings she skips meals frequently she frequently makes poor food choices she has problems with excessive hunger  she frequently eats larger portions than normal  she has binge eating behaviors she struggles with emotional eating    Angel French feels her energy is lower than it should be. This has worsened with weight gain and has not worsened recently. Angel French admits to daytime somnolence and denies waking up still tired. Angel French has a diagnosis of obstructive sleep apnea, which may be contributing to her Angel. Patent has a history of symptoms of  daytime Angel. Angel French generally gets 6 to 10 hours of sleep per night, and states they generally have restful sleep. Snoring is not present since CPAP began in February. Apneic episodes were present before CPAP. Epworth Sleepiness Score is 5  EKG was ordered today and shows 1st degree AV block.  Dyspnea on exertion Angel French notes increasing shortness of breath with exercising and seems to be worsening over time with weight gain. She notes getting out of breath sooner with activity than she used to. This has not gotten worse recently. EKG was ordered today and shows 1st degree AV block. Angel French denies orthopnea.  Chronic Kidney Disease Stage 3 Angel French has a diagnosis of chronic kidney disease stage 3 and sees Dr. Lorrene Reid of [facility]. She has been on a diuretic for years.  Vitamin D deficiency Angel French has a diagnosis of vitamin D deficiency, given obesity. She is currently taking vit D and denies nausea, vomiting or muscle weakness.  Diabetes II Angel French has a diagnosis of diabetes type II. Angel French denies any hypoglycemic episodes. She has a history of A1c of 7.3 (03/23/17), and last microalbumin was last year. She is attempting to work on intensive lifestyle modifications including diet, exercise, and weight loss to help control her blood glucose levels.  Depression Screen Angel French Food and Mood (modified PHQ-9) score was  Depression screen PHQ 2/9 03/07/2018  Decreased Interest 0  Down, Depressed, Hopeless 1  PHQ - 2 Score 1  Altered sleeping 0  Tired, decreased energy 1  Change in appetite 3  Feeling bad or failure  about yourself  0  Trouble concentrating 1  Moving slowly or fidgety/restless 0  Suicidal thoughts 0  PHQ-9 Score 6  Difficult doing work/chores Not difficult at all  Some recent data might be hidden    ALLERGIES: Allergies  Allergen Reactions  . Codeine Anaphylaxis  . Contrast Media [Iodinated Diagnostic Agents] Anaphylaxis  . Nitrofurantoin Monohyd Macro  Anaphylaxis  . Betadine [Povidone Iodine] Itching  . Folic Acid Itching  . Gabapentin Other (See Comments)    Makes Angel French feel drunk  . Iodine Hives  . Lyrica [Pregabalin] Other (See Comments)    Makes Angel French feel drunk  . Red Dye Itching  . Ultram [Tramadol Hcl] Nausea And Vomiting    MEDICATIONS: Current Outpatient Medications on File Prior to Visit  Medication Sig Dispense Refill  . acetaminophen (TYLENOL) 325 MG tablet Take 650 mg by mouth every 6 (six) hours as needed (every 6 weeks before RA infusion).    Marland Kitchen allopurinol (ZYLOPRIM) 100 MG tablet Take 1 tablet (100 mg total) by mouth daily. Ov needed (Angel French taking differently: Take 200 mg by mouth daily. ) 90 tablet 3  . aspirin 325 MG tablet Take 325 mg by mouth daily.     . Blood Glucose Monitoring Suppl (BLOOD GLUCOSE METER KIT AND SUPPLIES) KIT Dispense based on Angel French and insurance preference. Use up to four times daily as directed. (FOR ICD-9 250.00, 250.01). 1 each 11  . Cholecalciferol (VITAMIN D3) 5000 units CAPS Take 1 capsule by mouth daily.    . diclofenac sodium (VOLTAREN) 1 % GEL Apply 2 g topically 4 (four) times daily. (Angel French taking differently: Apply 2 g topically 2 (two) times daily as needed (pain). ) 100 g 3  . diphenhydrAMINE (BENADRYL) 25 MG tablet Take 25 mg by mouth every 6 (six) hours as needed (every 6 weeks prior to RA infusion).    . DULoxetine (CYMBALTA) 30 MG capsule TAKE 1 CAPSULE BY MOUTH EVERY DAY 90 capsule 0  . escitalopram (LEXAPRO) 20 MG tablet TAKE 1 TABLET (20 MG TOTAL) BY MOUTH DAILY. 30 tablet 11  . furosemide (LASIX) 20 MG tablet TAKE 1 TAB BY MOUTH ONCE DAILY 90 tablet 0  . glucose blood test strip Check sugar three times daily  Dx: DMII insulin dependent with retinopathy, neuropathy controlled 300 each 3  . HYDROcodone-acetaminophen (NORCO) 10-325 MG tablet Take 1 tablet by mouth every 12 (twelve) hours as needed. 60 tablet 0  . insulin detemir (LEVEMIR) 100 UNIT/ML injection USE 60  UNITS AS DIRECTED AT BEDTIME dx: E11.42, I34.7425) 20 mL 1  . Insulin Syringes, Disposable, U-100 0.5 ML MISC 28 Units by Does not apply route 2 (two) times daily. 100 each 11  . leflunomide (ARAVA) 20 MG tablet     . Needles & Syringes MISC 1 Syringe by Does not apply route 2 (two) times daily. 100 each 11  . NOVOLOG 100 UNIT/ML injection INJECT 15-20 UNITS SUBCUTANEOUSLY 3 TIMES DAILY PER SLIDING SCALE 10 mL 7  . omeprazole (PRILOSEC) 20 MG capsule TAKE 1 CAPSULE BY MOUTH EVERY DAY 90 capsule 3  . oxybutynin (DITROPAN XL) 15 MG 24 hr tablet Take 1 tablet (15 mg total) by mouth at bedtime. (Angel French taking differently: Take 30 mg by mouth at bedtime. ) 90 tablet 3  . predniSONE (DELTASONE) 5 MG tablet Take 5 mg by mouth daily with breakfast.    . rosuvastatin (CRESTOR) 10 MG tablet Take 1 tablet (10 mg total) by mouth daily. 90 tablet 1  .  traZODone (DESYREL) 100 MG tablet TAKE 2 TABLETS BY MOUTH EVERY DAY AT BEDTIME 180 tablet 1  . ULTICARE INSULIN SYRINGE 31G X 5/16" 0.5 ML MISC USE AS DIRECTED TO INJECT INSULIN 2 TIMES DAILY 100 each 4   No current facility-administered medications on file prior to visit.     PAST MEDICAL HISTORY: Past Medical History:  Diagnosis Date  . Allergy    generic allergy pill; Spring and Fall only.  . Anxiety   . Arthritis    DDD lumbar, R hip OA.  s/p ortho consult in past.  . Blood transfusion without reported diagnosis    Mountain climbing accident in [place].  . Brachial plexus disorders   . Cataract    B retractions.  . Chronic kidney disease    stage 3 per pt.   . Chronic pain syndrome   . Chronic renal insufficiency, stage 3 (moderate) (HCC)   . Constipation   . DDD (degenerative disc disease), lumbar   . Depression   . Diabetes mellitus   . Diabetic peripheral neuropathy associated with type 2 diabetes mellitus ([place])   . Diabetic retinopathy ([place])   . Diabetic retinopathy associated with type 2 diabetes mellitus ([place])    s/p laser treatment  multiple.  Unable to drive.  . Fatty liver   . Fibromyalgia   . Food allergy   . GERD (gastroesophageal reflux disease)   . Hypercholesteremia   . Hyperlipidemia   . Hypertension    controlled, off meds   . IBS (irritable bowel syndrome)   . Leg edema   . Neuromuscular disorder ([place])   . OSA (obstructive sleep apnea)   . Osteoarthritis   . Rheumatic fever   . Rheumatoid arthritis ([place])   . Stomach ulcer   . Swallowing difficulty   . TIA (transient ischemic attack)   . Ulcer    Peptic ulcer H. Pylori + s/p treatment.  Upper GI diagnosed.[name] Prilosec PRN .    PAST SURGICAL HISTORY: Past Surgical History:  Procedure Laterality Date  .  2 SPINAL INJECTIONS     . ABDOMINAL HYSTERECTOMY  11/02/1979   DUB; cervical dysplasia; ovaries intact.  . ABDOMINAL SURGERY     staph abcess   . Behavioral [facility] Admission     age 28; three months in [place].  Marland Kitchen BREAST BIOPSY    . CARDIAC CATHETERIZATION  11/02/2007   normal coronary arteries.  . CARPAL TUNNEL RELEASE     Bilateral.  . CATARACT EXTRACTION, BILATERAL    . CHOLECYSTECTOMY    . ESOPHAGEAL MANOMETRY N/A 09/14/2017   Procedure: ESOPHAGEAL MANOMETRY (EM);  Surgeon: Ronnette Juniper, MD;  Location: [facility];  Service: Gastroenterology;  Laterality: N/A;  . EYE SURGERY     Cataracts B. Laser surgery x 7 for Diabetic Retinopathy  . TONSILLECTOMY      SOCIAL HISTORY: Social History   Tobacco Use  . Smoking status: Former Research scientist (life sciences)  . Smokeless tobacco: Never Used  . Tobacco comment: Quit 1987  Substance Use Topics  . Alcohol use: No    Alcohol/week: 0.0 oz  . Drug use: No    FAMILY HISTORY: Family History  Adopted: Yes  Family history unknown: Yes    ROS: Review of Systems  Constitutional: Positive for malaise/Angel.  HENT: Positive for hearing loss.        Dentures Dry Mouth  Eyes:       Wear Glasses or Contacts  Respiratory: Positive for shortness of breath (on exertion).  Cardiovascular: Negative  for orthopnea.  Gastrointestinal: Positive for heartburn. Negative for nausea and vomiting.       Swallowing Difficulty  Musculoskeletal: Positive for back pain.       Muscle or Joint Pain Muscle Stiffness Red or Swollen Joints Negative for muscle weakness  Endo/Heme/Allergies:       Negative for hypoglycemia  Psychiatric/Behavioral: Positive for depression.       Stress    PHYSICAL EXAM: Blood pressure 122/74, pulse 70, temperature 98.6 F (37 C), temperature source Oral, height _0  (1.575 m), weight 224 lb (101.6 kg), SpO2 98 %. Body mass index is 40.97 kg/m. Physical Exam  Constitutional: She is oriented to person, place, and time. She appears well-developed and well-nourished.  HENT:  Head: Normocephalic and atraumatic.  Nose: Nose normal.  Eyes: EOM are normal. No scleral icterus.  Neck: Normal range of motion. Neck supple. No thyromegaly present.  Cardiovascular: Normal rate and regular rhythm.  Pulmonary/Chest: Effort normal. No respiratory distress.  Abdominal: Soft. There is no tenderness.  + obesity  Musculoskeletal: Normal range of motion.  Range of Motion normal in all 4 extremities   Neurological: She is alert and oriented to person, place, and time. Coordination normal.  Skin: Skin is warm and dry.  Psychiatric: She has a normal mood and affect. Her behavior is normal.  Vitals reviewed.   RECENT LABS AND TESTS: BMET    Component Value Date/Time   NA 140 03/07/2018 1310   K 3.7 03/07/2018 1310   CL 97 03/07/2018 1310   CO2 28 03/07/2018 1310   GLUCOSE 111 (H) 03/07/2018 1310   GLUCOSE 58 (L) 09/08/2016 1035   BUN 26 03/07/2018 1310   CREATININE 1.36 (H) 03/07/2018 1310   CREATININE 1.30 (H) 09/08/2016 1035   CALCIUM 9.4 03/07/2018 1310   GFRNONAA 41 (L) 03/07/2018 1310   GFRNONAA 36 (L) 07/10/2014 1354   GFRAA 47 (L) 03/07/2018 1310   GFRAA 41 (L) 07/10/2014 1354   Lab Results  Component Value Date   HGBA1C 8.1 (H) 03/07/2018   Lab Results    Component Value Date   INSULIN 6.2 03/07/2018   CBC    Component Value Date/Time   WBC 5.1 01/30/2018 1000   WBC 5.7 09/08/2016 1035   RBC 3.38 (L) 01/30/2018 1000   RBC 3.82 09/08/2016 1035   HGB 10.9 (L) 01/30/2018 1000   HCT 30.8 (L) 01/30/2018 1000   PLT 226 01/30/2018 1000   MCV 91 01/30/2018 1000   MCH 32.2 01/30/2018 1000   MCH 29.8 09/08/2016 1035   MCHC 35.4 01/30/2018 1000   MCHC 32.5 09/08/2016 1035   RDW 14.8 01/30/2018 1000   LYMPHSABS 1.7 01/30/2018 1000   MONOABS 513 09/08/2016 1035   EOSABS 0.3 01/30/2018 1000   BASOSABS 0.0 01/30/2018 1000   Iron/TIBC/Ferritin/ %Sat    Component Value Date/Time   IRON 79 02/03/2016 1015   TIBC 309 02/03/2016 1015   IRONPCTSAT 26 02/03/2016 1015   Lipid Panel     Component Value Date/Time   CHOL 168 03/07/2018 1310   TRIG 237 (H) 03/07/2018 1310   HDL 40 03/07/2018 1310   CHOLHDL 5.1 (H) 01/09/2018 1221   CHOLHDL 5.3 (H) 09/08/2016 1035   VLDL 46 (H) 09/08/2016 1035   LDLCALC 81 03/07/2018 1310   Hepatic Function Panel     Component Value Date/Time   PROT 7.4 03/07/2018 1310   ALBUMIN 4.1 03/07/2018 1310   AST 40 03/07/2018 1310   ALT 36 (  H) 03/07/2018 1310   ALKPHOS 87 03/07/2018 1310   BILITOT 0.3 03/07/2018 1310      Component Value Date/Time   TSH 0.958 03/07/2018 1310   TSH 0.874 01/30/2018 1000   TSH 1.060 03/23/2017 1230    ECG  shows NSR with a rate of 80 BPM INDIRECT CALORIMETER done today shows a VO2 of 248 and a REE of 1723.  Her calculated basal metabolic rate is 1601 thus her basal metabolic rate is better than expected.    ASSESSMENT AND PLAN: Other Angel - Plan: EKG 12-Lead, T3, T4, free, TSH  Shortness of breath on exertion  Stage 3 chronic kidney disease (HCC) - Plan: Comprehensive metabolic panel  Vitamin D deficiency - Plan: VITAMIN D 25 Hydroxy (Vit-D Deficiency, Fractures)  Type 2 diabetes mellitus with stage 3 chronic kidney disease, with long-term current use of  insulin (HCC) - Plan: Hemoglobin A1c, Insulin, random, Lipid Panel With LDL/HDL Ratio, Microalbumin / creatinine urine ratio  Depression screening  Class 3 severe obesity with serious comorbidity and body mass index (BMI) of 40.0 to 44.9 in adult, unspecified obesity type (HCC)  PLAN: Angel Angel French was informed that her Angel may be related to obesity, depression or many other causes. Labs will be ordered, and in the meanwhile Ladene has agreed to work on diet, exercise and weight loss to help with Angel. Proper sleep hygiene was discussed including the need for 7-8 hours of quality sleep each night. A sleep study was not ordered based on symptoms and Epworth score. We will order indirect calorimetry.  Dyspnea on exertion Ellaina's shortness of breath appears to be obesity related and exercise induced. She has agreed to work on weight loss and gradually increase exercise to treat her exercise induced shortness of breath. If Oasis follows our instructions and loses weight without improvement of her shortness of breath, we will plan to refer to pulmonology. We will order labs and indirect calorimetry. We will monitor this condition regularly. [place] agrees to this plan.  Chronic Kidney Disease Stage 3 We will check CMP today and Annamae agrees to follow up with our clinic in 2 weeks.  Vitamin D Deficiency Arleny was informed that low vitamin D levels contributes to Angel and are associated with obesity, breast, and colon cancer. She agrees to continue to take Vit D _0 ,000 IU daily. We will check vitamin D level today and she will follow up for routine testing of vitamin D, at least 2-3 times per year. She was informed of the risk of over-replacement of vitamin D and agrees to not increase her dose unless she discusses this with [name] first.  Diabetes II Caroleann has been given extensive diabetes education by myself today including ideal fasting and post-prandial blood glucose readings, individual  ideal Hgb A1c goals and hypoglycemia prevention. We discussed the importance of good blood sugar control to decrease the likelihood of diabetic complications such as nephropathy, neuropathy, limb loss, blindness, coronary artery disease, and death. We discussed the importance of intensive lifestyle modification including diet, exercise and weight loss as the first line treatment for diabetes. Cyriah agrees to check blood sugar 2 times daily and will follow up at the agreed upon time.  Depression Screen Lotoya had a mildly positive depression screening. Depression is commonly associated with obesity and often results in emotional eating behaviors. We will monitor this closely and work on CBT to help improve the non-hunger eating patterns. Referral to Psychology may be required if no improvement is seen as she  continues in our clinic.  Obesity Khloee is currently in the action stage of change and her goal is to continue with weight loss efforts. I recommend [place] begin the structured treatment plan as follows:  She has agreed to follow the Category 2 plan plus half cup of cottage cheese Zaida has been instructed to eventually work up to a goal of 150 minutes of combined cardio and strengthening exercise per week for weight loss and overall health benefits. We discussed the following Behavioral Modification Strategies today: better snacking choices, planning for success, increasing lean protein intake, increasing vegetables, decrease eating out and work on meal planning and easy cooking plans   She was informed of the importance of frequent follow up visits to maximize her success with intensive lifestyle modifications for her multiple health conditions. She was informed we would discuss her lab results at her next visit unless there is a critical issue that needs to be addressed sooner. Angel French agreed to keep her next visit at the agreed upon time to discuss these results.    OBESITY BEHAVIORAL  INTERVENTION VISIT  Today's visit was # 1 out of 22.  Starting weight: 224 lbs Starting date: 03/07/18 Today's weight : 224 lbs Today's date: 03/07/2018 Total lbs lost to date: 0 (Patients must lose 7 lbs in the first 6 months to continue with counseling)   ASK: We discussed the diagnosis of obesity with Angel French today and Angel French agreed to give [name] permission to discuss obesity behavioral modification therapy today.  ASSESS: Jaylea has the diagnosis of obesity and her BMI today is 40.96 Declan is in the action stage of change   ADVISE: Toree was educated on the multiple health risks of obesity as well as the benefit of weight loss to improve her health. She was advised of the need for long term treatment and the importance of lifestyle modifications.  AGREE: Multiple dietary modification options and treatment options were discussed and  Arley agreed to the above obesity treatment plan.   I, Doreene Nest, am acting as transcriptionist for  Eber Jones, MD  I have reviewed the above documentation for accuracy and completeness, and I agree with the above. - Ilene Qua, MD

## 2018-03-12 ENCOUNTER — Other Ambulatory Visit: Payer: Self-pay | Admitting: Family Medicine

## 2018-03-21 ENCOUNTER — Ambulatory Visit (INDEPENDENT_AMBULATORY_CARE_PROVIDER_SITE_OTHER): Payer: Medicare Other | Admitting: Family Medicine

## 2018-03-21 VITALS — BP 138/66 | HR 81 | Temp 98.0°F | Ht 62.0 in | Wt 223.0 lb

## 2018-03-21 DIAGNOSIS — Z794 Long term (current) use of insulin: Secondary | ICD-10-CM

## 2018-03-21 DIAGNOSIS — E1122 Type 2 diabetes mellitus with diabetic chronic kidney disease: Secondary | ICD-10-CM | POA: Diagnosis not present

## 2018-03-21 DIAGNOSIS — Z6841 Body Mass Index (BMI) 40.0 and over, adult: Secondary | ICD-10-CM

## 2018-03-21 DIAGNOSIS — N183 Chronic kidney disease, stage 3 unspecified: Secondary | ICD-10-CM

## 2018-03-21 DIAGNOSIS — I6523 Occlusion and stenosis of bilateral carotid arteries: Secondary | ICD-10-CM | POA: Diagnosis not present

## 2018-03-22 ENCOUNTER — Other Ambulatory Visit: Payer: Self-pay | Admitting: Family Medicine

## 2018-03-22 NOTE — Progress Notes (Signed)
Office: [phone]  /  Fax: [phone]   HPI:   Chief Complaint: OBESITY Angel French is here to discuss her progress with her obesity treatment plan. She is on  the Category 2 plan plus half cup of cottage cheese, and is following her eating plan approximately 95 % of the time. She states she is exercising 0 minutes 0 times per week. Angel French is needing more food at breakfast and is adding 1.5 ounces of meat from lunch to breakfast. Her weight is 223 lb (101.2 kg) today and has had a weight loss of 1 pound over a period of 2 weeks since her last visit. She has lost 1 lb since starting treatment with [name].  Diabetes II Angel French has a diagnosis of diabetes type II. Angel French states fasting BGs range between 102 and 159, post prandial BGs range between 109 and 186. She is currently on levemir and novolog. She has few lows (lowest of 59). Last A1c was at 8.1 She has been working on intensive lifestyle modifications including diet, exercise, and weight loss to help control her blood glucose levels.  CKD (chronic kidney disease) Stage 3 Angel French has a diagnosis of chronic kidney disease and has a creatinine of 1.36 (previously 1.42).  ALLERGIES: Allergies  Allergen Reactions  . Codeine Anaphylaxis  . Contrast Media [Iodinated Diagnostic Agents] Anaphylaxis  . Nitrofurantoin Monohyd Macro Anaphylaxis  . Betadine [Povidone Iodine] Itching  . Folic Acid Itching  . Gabapentin Other (See Comments)    Makes patient feel drunk  . Iodine Hives  . Lyrica [Pregabalin] Other (See Comments)    Makes patient feel drunk  . Red Dye Itching  . Ultram [Tramadol Hcl] Nausea And Vomiting    MEDICATIONS: Current Outpatient Medications on File Prior to Visit  Medication Sig Dispense Refill  . acetaminophen (TYLENOL) 325 MG tablet Take 650 mg by mouth every 6 (six) hours as needed (every 6 weeks before RA infusion).    Marland Kitchen allopurinol (ZYLOPRIM) 100 MG tablet Take 1 tablet (100 mg total) by mouth daily. Ov needed  (Patient taking differently: Take 200 mg by mouth daily. ) 90 tablet 3  . aspirin 325 MG tablet Take 325 mg by mouth daily.     . Blood Glucose Monitoring Suppl (BLOOD GLUCOSE METER KIT AND SUPPLIES) KIT Dispense based on patient and insurance preference. Use up to four times daily as directed. (FOR ICD-9 250.00, 250.01). 1 each 11  . Cholecalciferol (VITAMIN D3) 5000 units CAPS Take 1 capsule by mouth daily.    . diclofenac sodium (VOLTAREN) 1 % GEL Apply 2 g topically 4 (four) times daily. (Patient taking differently: Apply 2 g topically 2 (two) times daily as needed (pain). ) 100 g 3  . diphenhydrAMINE (BENADRYL) 25 MG tablet Take 25 mg by mouth every 6 (six) hours as needed (every 6 weeks prior to RA infusion).    . DULoxetine (CYMBALTA) 30 MG capsule TAKE 1 CAPSULE BY MOUTH EVERY DAY 90 capsule 0  . escitalopram (LEXAPRO) 20 MG tablet TAKE 1 TABLET BY MOUTH EVERY DAY 30 tablet 5  . furosemide (LASIX) 20 MG tablet TAKE 1 TABLET BY MOUTH EVERY DAY 90 tablet 0  . glucose blood test strip Check sugar three times daily  Dx: DMII insulin dependent with retinopathy, neuropathy controlled 300 each 3  . HYDROcodone-acetaminophen (NORCO) 10-325 MG tablet Take 1 tablet by mouth every 12 (twelve) hours as needed. 60 tablet 0  . insulin detemir (LEVEMIR) 100 UNIT/ML injection USE 60 UNITS AS DIRECTED  AT BEDTIME dx: E11.42, F81.8299) (Patient taking differently: USE 55 UNITS AS DIRECTED AT BEDTIME dx: E11.42, B71.6967)) 20 mL 1  . Insulin Syringes, Disposable, U-100 0.5 ML MISC 28 Units by Does not apply route 2 (two) times daily. 100 each 11  . leflunomide (ARAVA) 20 MG tablet     . Needles & Syringes MISC 1 Syringe by Does not apply route 2 (two) times daily. 100 each 11  . NOVOLOG 100 UNIT/ML injection INJECT 15-20 UNITS SUBCUTANEOUSLY 3 TIMES DAILY PER SLIDING SCALE (Patient taking differently: INJECT 10 UNITS SUBCUTANEOUSLY 3 TIMES DAILY UNLESS BS LESS THAN 100) 10 mL 7  . omeprazole (PRILOSEC) 20 MG  capsule TAKE 1 CAPSULE BY MOUTH EVERY DAY 90 capsule 3  . oxybutynin (DITROPAN XL) 15 MG 24 hr tablet Take 1 tablet (15 mg total) by mouth at bedtime. (Patient taking differently: Take 30 mg by mouth at bedtime. ) 90 tablet 3  . predniSONE (DELTASONE) 5 MG tablet Take 5 mg by mouth daily with breakfast.    . rosuvastatin (CRESTOR) 10 MG tablet Take 1 tablet (10 mg total) by mouth daily. 90 tablet 1  . traZODone (DESYREL) 100 MG tablet TAKE 2 TABLETS BY MOUTH EVERY DAY AT BEDTIME 180 tablet 1  . ULTICARE INSULIN SYRINGE 31G X 5/16" 0.5 ML MISC USE AS DIRECTED TO INJECT INSULIN 2 TIMES DAILY 100 each 4   No current facility-administered medications on file prior to visit.     PAST MEDICAL HISTORY: Past Medical History:  Diagnosis Date  . Allergy    generic allergy pill; Spring and Fall only.  . Anxiety   . Arthritis    DDD lumbar, R hip OA.  s/p ortho consult in past.  . Blood transfusion without reported diagnosis    Mountain climbing accident in [place].  . Brachial plexus disorders   . Cataract    B retractions.  . Chronic kidney disease    stage 3 per pt.   . Chronic pain syndrome   . Chronic renal insufficiency, stage 3 (moderate) (HCC)   . Constipation   . DDD (degenerative disc disease), lumbar   . Depression   . Diabetes mellitus   . Diabetic peripheral neuropathy associated with type 2 diabetes mellitus ([place])   . Diabetic retinopathy ([place])   . Diabetic retinopathy associated with type 2 diabetes mellitus ([place])    s/p laser treatment multiple.  Unable to drive.  . Fatty liver   . Fibromyalgia   . Food allergy   . GERD (gastroesophageal reflux disease)   . Hypercholesteremia   . Hyperlipidemia   . Hypertension    controlled, off meds   . IBS (irritable bowel syndrome)   . Leg edema   . Neuromuscular disorder ([place])   . OSA (obstructive sleep apnea)   . Osteoarthritis   . Rheumatic fever   . Rheumatoid arthritis ([place])   . Stomach ulcer   . Swallowing difficulty     . TIA (transient ischemic attack)   . Ulcer    Peptic ulcer H. Pylori + s/p treatment.  Upper GI diagnosed.[name] Prilosec PRN .    PAST SURGICAL HISTORY: Past Surgical History:  Procedure Laterality Date  .  2 SPINAL INJECTIONS     . ABDOMINAL HYSTERECTOMY  11/02/1979   DUB; cervical dysplasia; ovaries intact.  . ABDOMINAL SURGERY     staph abcess   . [facility] Admission     age 89; three months in [place].  Marland Kitchen  BREAST BIOPSY    . CARDIAC CATHETERIZATION  11/02/2007   normal coronary arteries.  . CARPAL TUNNEL RELEASE     Bilateral.  . CATARACT EXTRACTION, BILATERAL    . CHOLECYSTECTOMY    . ESOPHAGEAL MANOMETRY N/A 09/14/2017   Procedure: ESOPHAGEAL MANOMETRY (EM);  Surgeon: Ronnette Juniper, MD;  Location: [facility];  Service: Gastroenterology;  Laterality: N/A;  . EYE SURGERY     Cataracts B. Laser surgery x 7 for Diabetic Retinopathy  . TONSILLECTOMY      SOCIAL HISTORY: Social History   Tobacco Use  . Smoking status: Former Research scientist (life sciences)  . Smokeless tobacco: Never Used  . Tobacco comment: Quit 1987  Substance Use Topics  . Alcohol use: No    Alcohol/week: 0.0 oz  . Drug use: No    FAMILY HISTORY: Family History  Adopted: Yes  Family history unknown: Yes    ROS: Review of Systems  Constitutional: Positive for weight loss.  Endo/Heme/Allergies:       Positive for hypoglycemia    PHYSICAL EXAM: Blood pressure 138/66, pulse 81, temperature 98 F (36.7 C), temperature source Oral, height _0  (1.575 m), weight 223 lb (101.2 kg), SpO2 97 %. Body mass index is 40.79 kg/m. Physical Exam  Constitutional: She is oriented to person, place, and time. She appears well-developed and well-nourished.  Cardiovascular: Normal rate.  Pulmonary/Chest: Effort normal.  Musculoskeletal: Normal range of motion.  Neurological: She is oriented to person, place, and time.  Skin: Skin is warm and dry.  Psychiatric: She has a normal mood and affect. Her behavior is  normal.  Vitals reviewed.   RECENT LABS AND TESTS: BMET    Component Value Date/Time   NA 140 03/07/2018 1310   K 3.7 03/07/2018 1310   CL 97 03/07/2018 1310   CO2 28 03/07/2018 1310   GLUCOSE 111 (H) 03/07/2018 1310   GLUCOSE 58 (L) 09/08/2016 1035   BUN 26 03/07/2018 1310   CREATININE 1.36 (H) 03/07/2018 1310   CREATININE 1.30 (H) 09/08/2016 1035   CALCIUM 9.4 03/07/2018 1310   GFRNONAA 41 (L) 03/07/2018 1310   GFRNONAA 36 (L) 07/10/2014 1354   GFRAA 47 (L) 03/07/2018 1310   GFRAA 41 (L) 07/10/2014 1354   Lab Results  Component Value Date   HGBA1C 8.1 (H) 03/07/2018   HGBA1C 11.0 01/09/2018   HGBA1C 8.0 10/07/2017   HGBA1C 7.0 06/28/2017   HGBA1C 7.3 (H) 03/23/2017   Lab Results  Component Value Date   INSULIN 6.2 03/07/2018   CBC    Component Value Date/Time   WBC 5.1 01/30/2018 1000   WBC 5.7 09/08/2016 1035   RBC 3.38 (L) 01/30/2018 1000   RBC 3.82 09/08/2016 1035   HGB 10.9 (L) 01/30/2018 1000   HCT 30.8 (L) 01/30/2018 1000   PLT 226 01/30/2018 1000   MCV 91 01/30/2018 1000   MCH 32.2 01/30/2018 1000   MCH 29.8 09/08/2016 1035   MCHC 35.4 01/30/2018 1000   MCHC 32.5 09/08/2016 1035   RDW 14.8 01/30/2018 1000   LYMPHSABS 1.7 01/30/2018 1000   MONOABS 513 09/08/2016 1035   EOSABS 0.3 01/30/2018 1000   BASOSABS 0.0 01/30/2018 1000   Iron/TIBC/Ferritin/ %Sat    Component Value Date/Time   IRON 79 02/03/2016 1015   TIBC 309 02/03/2016 1015   IRONPCTSAT 26 02/03/2016 1015   Lipid Panel     Component Value Date/Time   CHOL 168 03/07/2018 1310   TRIG 237 (H) 03/07/2018 1310   HDL 40 03/07/2018  1310   CHOLHDL 5.1 (H) 01/09/2018 1221   CHOLHDL 5.3 (H) 09/08/2016 1035   VLDL 46 (H) 09/08/2016 1035   LDLCALC 81 03/07/2018 1310   Hepatic Function Panel     Component Value Date/Time   PROT 7.4 03/07/2018 1310   ALBUMIN 4.1 03/07/2018 1310   AST 40 03/07/2018 1310   ALT 36 (H) 03/07/2018 1310   ALKPHOS 87 03/07/2018 1310   BILITOT 0.3  03/07/2018 1310      Component Value Date/Time   TSH 0.958 03/07/2018 1310   TSH 0.874 01/30/2018 1000   TSH 1.060 03/23/2017 1230   Results for NIKYLA, NAVEDO (MRN [id number]) as of 03/22/2018 17:38  Ref. Range 03/07/2018 13:10  Vitamin D, 25-Hydroxy Latest Ref Range: 30.0 - 100.0 ng/mL 54.8   ASSESSMENT AND PLAN: Type 2 diabetes mellitus with stage 3 chronic kidney disease, with long-term current use of insulin (HCC)  Stage 3 chronic kidney disease (HCC)  Class 3 severe obesity with serious comorbidity and body mass index (BMI) of 40.0 to 44.9 in adult, unspecified obesity type ([place])  PLAN:  Diabetes II Angel French has been given extensive diabetes education by myself today including ideal fasting and post-prandial blood glucose readings, individual ideal Hgb A1c goals and hypoglycemia prevention. We discussed the importance of good blood sugar control to decrease the likelihood of diabetic complications such as nephropathy, neuropathy, limb loss, blindness, coronary artery disease, and death. We discussed the importance of intensive lifestyle modification including diet, exercise and weight loss as the first line treatment for diabetes. Angel French agrees to decrease levemir to 55 units qhs; decrease mealtime to 10 units, unless <100, then don't take novolog. Angel French agreed to follow up at the agreed upon time.  CKD (chronic kidney disease) Stage 3 Angel French will continue seeing [facility] and will follow up with our clinic in 2 weeks.  Obesity Angel French is currently in the action stage of change. As such, her goal is to continue with weight loss efforts She has agreed to follow the Category 2 plan plus 1/2 cup cottage cheese Angel French has been instructed to work up to a goal of 150 minutes of combined cardio and strengthening exercise per week for weight loss and overall health benefits. We discussed the following Behavioral Modification Strategies today: better snacking choices,  planning for success, increasing lean protein intake, increasing vegetables and work on meal planning and easy cooking plans  Angel French has agreed to follow up with our clinic in 2 weeks. She was informed of the importance of frequent follow up visits to maximize her success with intensive lifestyle modifications for her multiple health conditions.   OBESITY BEHAVIORAL INTERVENTION VISIT  Today's visit was # 2 out of 22.  Starting weight: 224 lbs Starting date: 03/07/18 Today's weight : 223 lbs Today's date: 03/21/2018 Total lbs lost to date: 1 (Patients must lose 7 lbs in the first 6 months to continue with counseling)   ASK: We discussed the diagnosis of obesity with Angel French today and Angel French agreed to give [name] permission to discuss obesity behavioral modification therapy today.  ASSESS: Angel French has the diagnosis of obesity and her BMI today is 40.78 Angel French is in the action stage of change   ADVISE: Angel French was educated on the multiple health risks of obesity as well as the benefit of weight loss to improve her health. She was advised of the need for long term treatment and the importance of lifestyle modifications.  AGREE: Multiple dietary modification options and  treatment options were discussed and  [place] agreed to the above obesity treatment plan.  I, Doreene Nest, am acting as transcriptionist for Eber Jones, MD  I have reviewed the above documentation for accuracy and completeness, and I agree with the above. - Ilene Qua, MD

## 2018-03-26 ENCOUNTER — Other Ambulatory Visit: Payer: Self-pay | Admitting: Family Medicine

## 2018-03-28 ENCOUNTER — Encounter: Payer: Self-pay | Admitting: Family Medicine

## 2018-04-04 ENCOUNTER — Telehealth: Payer: Self-pay | Admitting: Family Medicine

## 2018-04-04 NOTE — Telephone Encounter (Signed)
Copied from [place] [phone]. Topic: Quick Communication - See Telephone Encounter >> Apr 04, 2018 10:39 AM Ahmed Prima L wrote: CRM for notification. See Telephone encounter for: 04/04/18.  Patient states she has lost her prescription for NOVOLOG 100 UNIT/ML injection and asked the pharmacy to refill it and they said she needed a new prescription  [facility] [place], [place]

## 2018-04-05 NOTE — Telephone Encounter (Signed)
Called pt. Pt filled from refills. Stated she lost a bottle but that she was ok for now.

## 2018-04-05 NOTE — Telephone Encounter (Signed)
LOV 01/30/18 Dr. [facility] requesting a new order.

## 2018-04-06 ENCOUNTER — Other Ambulatory Visit: Payer: Self-pay | Admitting: Family Medicine

## 2018-04-08 ENCOUNTER — Encounter: Payer: Self-pay | Admitting: Neurology

## 2018-04-10 ENCOUNTER — Ambulatory Visit (INDEPENDENT_AMBULATORY_CARE_PROVIDER_SITE_OTHER): Payer: Medicare Other | Admitting: Adult Health

## 2018-04-10 ENCOUNTER — Encounter: Payer: Self-pay | Admitting: Adult Health

## 2018-04-10 VITALS — BP 141/78 | HR 87 | Ht 62.0 in | Wt 227.0 lb

## 2018-04-10 DIAGNOSIS — G4733 Obstructive sleep apnea (adult) (pediatric): Secondary | ICD-10-CM

## 2018-04-10 DIAGNOSIS — Z9989 Dependence on other enabling machines and devices: Secondary | ICD-10-CM | POA: Diagnosis not present

## 2018-04-10 DIAGNOSIS — I6523 Occlusion and stenosis of bilateral carotid arteries: Secondary | ICD-10-CM | POA: Diagnosis not present

## 2018-04-10 NOTE — Progress Notes (Signed)
PATIENT: Angel French DOB: August 31, 1951  REASON FOR VISIT: follow up HISTORY FROM: patient  HISTORY OF PRESENT ILLNESS: Today 04/10/18: Angel French is a 67 year old female with a history of obstructive sleep apnea on CPAP.  She returns today for follow-up.  Her CPAP download indicates that she use her machine 29 out of 30 days for compliance of 97%.  She is machine greater than 4 hours 26 out of 30 days for compliance of 87%.  On average she uses her machine 7 hours and 15 minutes.  She is on AutoSet with a pressure between 5 and 12 cm of water.  She does report that sometimes she feels that the pressure is not high enough.  Her residual AHI is 3.  Her leak in the 95th percentile is low at 17.7 L/min.  Her average pressure is 11.3.  She reports that the CPAP works well for her.  She returns today for evaluation.  HISTORY 12/06/17 Copied from Dr. Edwena Felty note: Angel French is a 67 y.o. female , seen here as in a revisit . I have the pleasure of seeing Angel French today on 06 December 2017.  The patient had 2 distinct events have changed her sleep habit in the short-term.  She lost her fianc of the last 9 years, the couple lived together" she is grieving and it has affected how well she sleeps.  In addition, she was diagnosed with obstructive sleep apnea after a baseline polysomnography on 02 August 2017 and has begun using CPAP highly compliant at 100%.   The baseline study showed mild apnea with an AHI of 14.6 in rem sleep this was accentuated to an AHI of 33.6/h the patient slept all night supine during the test.  She did not have periodic limb movements or other arousals.  Nadir SPO2 was 78% with 99 minutes of total desaturation time and because of the CPAP was the only option of treatment.  It is also possible that the patient retain CO2 but this was not tested.  She was titrated to CPAP on 8 November, reached a sleep efficiency of 97.3% by using a CPAP pressure of 8 cmH2O.  Both apnea and  hypoxemia resolved under 7 and 8 cm water pressure and she used a air fit nasal pillow P 10 small size.   This has now become a difficulty as it has irritated the nostrils, has dried out her nasal passages and she reports that she has significant oral air leaks, her mouth just drops open during sleep.  But she has already read some of the benefits of CPAP therapy she would like these addressed today.  First of all I would like to congratulate her to 100% compliance on CPAP, residual AHI of only 0.6/h average use of time 9 hours and 53 minutes, she is using an AutoSet between 5 and 10 cmH2O with 3 cm EPR and the 95th percentile pressure is 9.9 cmH2O.  Based on this I will actually bump up her maximum pressure to 12 cmH2O and we are going to reset her humidifier for higher levels here today.  If she wants to switch to a full facemask I will refer her to [facility] to be fitted.  Therapies benefits are reflected in an Epworth sleepiness score down to 6 points and fatigue severity score to 31 points from previous high level Epworth score at 13 points.   REVIEW OF SYSTEMS: Out of a complete 14 system review of symptoms, the patient complains  only of the following symptoms, and all other reviewed systems are negative.  Epworth sleepiness score 6  ALLERGIES: Allergies  Allergen Reactions  . Codeine Anaphylaxis  . Contrast Media [Iodinated Diagnostic Agents] Anaphylaxis  . Nitrofurantoin Monohyd Macro Anaphylaxis  . Betadine [Povidone Iodine] Itching  . Folic Acid Itching  . Gabapentin Other (See Comments)    Makes patient feel drunk  . Iodine Hives  . Lyrica [Pregabalin] Other (See Comments)    Makes patient feel drunk  . Red Dye Itching  . Ultram [Tramadol Hcl] Nausea And Vomiting    HOME MEDICATIONS: Outpatient Medications Prior to Visit  Medication Sig Dispense Refill  . acetaminophen (TYLENOL) 325 MG tablet Take 650 mg by mouth every 6 (six) hours as needed (every 6 weeks before RA infusion).     Marland Kitchen allopurinol (ZYLOPRIM) 100 MG tablet Take 1 tablet (100 mg total) by mouth daily. Ov needed (Patient taking differently: Take 200 mg by mouth daily. ) 90 tablet 3  . aspirin 325 MG tablet Take 325 mg by mouth daily.     . Blood Glucose Monitoring Suppl (BLOOD GLUCOSE METER KIT AND SUPPLIES) KIT Dispense based on patient and insurance preference. Use up to four times daily as directed. (FOR ICD-9 250.00, 250.01). 1 each 11  . Cholecalciferol (VITAMIN D3) 5000 units CAPS Take 1 capsule by mouth daily.    . diclofenac sodium (VOLTAREN) 1 % GEL Apply 2 g topically 4 (four) times daily. (Patient taking differently: Apply 2 g topically 2 (two) times daily as needed (pain). ) 100 g 3  . diphenhydrAMINE (BENADRYL) 25 MG tablet Take 25 mg by mouth every 6 (six) hours as needed (every 6 weeks prior to RA infusion).    . DULoxetine (CYMBALTA) 30 MG capsule TAKE 1 CAPSULE BY MOUTH EVERY DAY 90 capsule 0  . escitalopram (LEXAPRO) 20 MG tablet TAKE 1 TABLET BY MOUTH EVERY DAY 90 tablet 1  . furosemide (LASIX) 20 MG tablet TAKE 1 TABLET BY MOUTH EVERY DAY 90 tablet 0  . glucose blood test strip Check sugar three times daily  Dx: DMII insulin dependent with retinopathy, neuropathy controlled 300 each 3  . HYDROcodone-acetaminophen (NORCO) 10-325 MG tablet Take 1 tablet by mouth every 12 (twelve) hours as needed. 60 tablet 0  . Insulin Syringes, Disposable, U-100 0.5 ML MISC 28 Units by Does not apply route 2 (two) times daily. 100 each 11  . leflunomide (ARAVA) 20 MG tablet Take 20 mg by mouth daily.     Marland Kitchen LEVEMIR 100 UNIT/ML injection USE 60 UNITS AS DIRECTED AT BEDTIME 20 mL 1  . Needles & Syringes MISC 1 Syringe by Does not apply route 2 (two) times daily. 100 each 11  . NOVOLOG 100 UNIT/ML injection INJECT 15-20 UNITS SUBCUTANEOUSLY 3 TIMES DAILY PER SLIDING SCALE (Patient taking differently: INJECT 10 UNITS SUBCUTANEOUSLY 3 TIMES DAILY UNLESS BS LESS THAN 100) 10 mL 7  . omeprazole (PRILOSEC) 20 MG  capsule TAKE 1 CAPSULE BY MOUTH EVERY DAY 90 capsule 3  . oxybutynin (DITROPAN XL) 15 MG 24 hr tablet TAKE 1 TABLET BY MOUTH AT BEDTIME 90 tablet 3  . predniSONE (DELTASONE) 5 MG tablet Take 5 mg by mouth daily with breakfast. Only takes with RA Flare.    . rosuvastatin (CRESTOR) 10 MG tablet Take 1 tablet (10 mg total) by mouth daily. 90 tablet 1  . traZODone (DESYREL) 100 MG tablet TAKE 2 TABLETS BY MOUTH EVERY DAY AT BEDTIME 180 tablet 1  .  ULTICARE INSULIN SYRINGE 31G X 5/16" 0.5 ML MISC USE AS DIRECTED TO INJECT INSULIN 2 TIMES DAILY 100 each 4   No facility-administered medications prior to visit.     PAST MEDICAL HISTORY: Past Medical History:  Diagnosis Date  . Allergy    generic allergy pill; Spring and Fall only.  . Anxiety   . Arthritis    DDD lumbar, R hip OA.  s/p ortho consult in past.  . Blood transfusion without reported diagnosis    Mountain climbing accident in [place].  . Brachial plexus disorders   . Cataract    B retractions.  . Chronic kidney disease    stage 3 per pt.   . Chronic pain syndrome   . Chronic renal insufficiency, stage 3 (moderate) (HCC)   . Constipation   . DDD (degenerative disc disease), lumbar   . Depression   . Diabetes mellitus   . Diabetic peripheral neuropathy associated with type 2 diabetes mellitus ([place])   . Diabetic retinopathy ([place])   . Diabetic retinopathy associated with type 2 diabetes mellitus ([place])    s/p laser treatment multiple.  Unable to drive.  . Fatty liver   . Fibromyalgia   . Food allergy   . GERD (gastroesophageal reflux disease)   . Hypercholesteremia   . Hyperlipidemia   . Hypertension    controlled, off meds   . IBS (irritable bowel syndrome)   . Leg edema   . Neuromuscular disorder ([place])   . OSA (obstructive sleep apnea)   . Osteoarthritis   . Rheumatic fever   . Rheumatoid arthritis ([place])   . Stomach ulcer   . Swallowing difficulty   . TIA (transient ischemic attack)   . Ulcer    Peptic ulcer H.  Pylori + s/p treatment.  Upper GI diagnosed.[name] Prilosec PRN .    PAST SURGICAL HISTORY: Past Surgical History:  Procedure Laterality Date  .  2 SPINAL INJECTIONS     . ABDOMINAL HYSTERECTOMY  11/02/1979   DUB; cervical dysplasia; ovaries intact.  . ABDOMINAL SURGERY     staph abcess   . [facility]     age 71; three months in [place].  Marland Kitchen BREAST BIOPSY    . CARDIAC CATHETERIZATION  11/02/2007   normal coronary arteries.  . CARPAL TUNNEL RELEASE     Bilateral.  . CATARACT EXTRACTION, BILATERAL    . CHOLECYSTECTOMY    . ESOPHAGEAL MANOMETRY N/A 09/14/2017   Procedure: ESOPHAGEAL MANOMETRY (EM);  Surgeon: Ronnette Juniper, MD;  Location: [facility];  Service: Gastroenterology;  Laterality: N/A;  . EYE SURGERY     Cataracts B. Laser surgery x 7 for Diabetic Retinopathy  . TONSILLECTOMY      FAMILY HISTORY: Family History  Adopted: Yes  Family history unknown: Yes    SOCIAL HISTORY: Social History   Socioeconomic History  . Marital status: Widowed    Spouse name: Marijean Niemann  . Number of children: 2  . Years of education: college  . Highest education level: Not on file  Occupational History  . Occupation: retired    Comment: retretied  Social Needs  . Financial resource strain: Not on file  . Food insecurity:    Worry: Not on file    Inability: Not on file  . Transportation needs:    Medical: Not on file    Non-medical: Not on file  Tobacco Use  . Smoking status: Former Research scientist (life sciences)  . Smokeless tobacco: Never Used  . Tobacco  comment: Quit 1987  Substance and Sexual Activity  . Alcohol use: No    Alcohol/week: 0.0 oz  . Drug use: No  . Sexual activity: Yes    Birth control/protection: Surgical, Post-menopausal    Comment: widow  Lifestyle  . Physical activity:    Days per week: Not on file    Minutes per session: Not on file  . Stress: Not on file  Relationships  . Social connections:    Talks on phone: Not on file    Gets together: Not  on file    Attends religious service: Not on file    Active member of club or organization: Not on file    Attends meetings of clubs or organizations: Not on file    Relationship status: Not on file  . Intimate partner violence:    Fear of current or ex partner: Not on file    Emotionally abused: Not on file    Physically abused: Not on file    Forced sexual activity: Not on file  Other Topics Concern  . Not on file  Social History Narrative   Marital status: widowed since 2009; dating x 6 years.  Happy; no abuse.      Children: 2 children (87 daughter, 84 son estranged); 2 grandchildren.      Lives: with boyfriend, daughter, granddaughter, friend of daughter.  Lives in pt house.      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  Diabetic retinopathy; unable to drive.      Tobacco:  Smoked x 20 years; quit 20 years.      Alcohol:  On special occasions; once per week on average.       Drugs:  None since college.      Exercise:  Walking several times per week; walks the dog.   Education college   Caffeine one cup daily.   Right handed      Advanced Directives: none; FULL CODE.  DNR/DNI.  HCPOA: [place]?              PHYSICAL EXAM  Vitals:   04/10/18 0834  BP: (!) 141/78  Pulse: 87  Weight: 227 lb (103 kg)  Height: '5\' 2"'$  (1.575 m)   Body mass index is 41.52 kg/m.  Generalized: Well developed, in no acute distress   Neurological examination  Mentation: Alert oriented to time, place, history taking. Follows all commands speech and language fluent Cranial nerve II-XII: Pupils were equal round reactive to light. Extraocular movements were full, visual field were full on confrontational test. Facial sensation and strength were normal. Uvula tongue midline. Head turning and shoulder shrug  were normal and symmetric.  Neck circumference 17 inches, Mallampati 3+ Motor: The motor testing reveals 5 over 5 strength of all 4 extremities. Good symmetric motor tone is  noted throughout.  Sensory: Sensory testing is intact to soft touch on all 4 extremities. No evidence of extinction is noted.  Coordination: Cerebellar testing reveals good finger-nose-finger and heel-to-shin bilaterally.  Gait and station: Gait is normal.   DIAGNOSTIC DATA (LABS, IMAGING, TESTING) - I reviewed patient records, labs, notes, testing and imaging myself where available.  Lab Results  Component Value Date   WBC 5.1 01/30/2018   HGB 10.9 (L) 01/30/2018   HCT 30.8 (L) 01/30/2018   MCV 91 01/30/2018   PLT 226 01/30/2018      Component Value Date/Time   NA 140 03/07/2018 1310   K 3.7 03/07/2018 1310  CL 97 03/07/2018 1310   CO2 28 03/07/2018 1310   GLUCOSE 111 (H) 03/07/2018 1310   GLUCOSE 58 (L) 09/08/2016 1035   BUN 26 03/07/2018 1310   CREATININE 1.36 (H) 03/07/2018 1310   CREATININE 1.30 (H) 09/08/2016 1035   CALCIUM 9.4 03/07/2018 1310   PROT 7.4 03/07/2018 1310   ALBUMIN 4.1 03/07/2018 1310   AST 40 03/07/2018 1310   ALT 36 (H) 03/07/2018 1310   ALKPHOS 87 03/07/2018 1310   BILITOT 0.3 03/07/2018 1310   GFRNONAA 41 (L) 03/07/2018 1310   GFRNONAA 36 (L) 07/10/2014 1354   GFRAA 47 (L) 03/07/2018 1310   GFRAA 41 (L) 07/10/2014 1354   Lab Results  Component Value Date   CHOL 168 03/07/2018   HDL 40 03/07/2018   LDLCALC 81 03/07/2018   TRIG 237 (H) 03/07/2018   CHOLHDL 5.1 (H) 01/09/2018   Lab Results  Component Value Date   HGBA1C 8.1 (H) 03/07/2018   Lab Results  Component Value Date   VITAMINB12 466 01/30/2018   Lab Results  Component Value Date   TSH 0.958 03/07/2018      ASSESSMENT AND PLAN 67 y.o. year old female  has a past medical history of Allergy, Anxiety, Arthritis, Blood transfusion without reported diagnosis, Brachial plexus disorders, Cataract, Chronic kidney disease, Chronic pain syndrome, Chronic renal insufficiency, stage 3 (moderate) (HCC), Constipation, DDD (degenerative disc disease), lumbar, Depression, Diabetes mellitus,  Diabetic peripheral neuropathy associated with type 2 diabetes mellitus ([place]), Diabetic retinopathy ([place]), Diabetic retinopathy associated with type 2 diabetes mellitus ([place]), Fatty liver, Fibromyalgia, Food allergy, GERD (gastroesophageal reflux disease), Hypercholesteremia, Hyperlipidemia, Hypertension, IBS (irritable bowel syndrome), Leg edema, Neuromuscular disorder ([place]), OSA (obstructive sleep apnea), Osteoarthritis, Rheumatic fever, Rheumatoid arthritis ([place]), Stomach ulcer, Swallowing difficulty, TIA (transient ischemic attack), and Ulcer. here with:  1.  Obstructive sleep apnea on CPAP  The patient CPAP download shows excellent compliance and good treatment of her apnea.  She will continue on CPAP.  I will adjust her pressure to 5 to 15 cm of water.  She is advised that if her symptoms worsen or she develops new symptoms she should let us know.  She will follow-up in 6 months or sooner if needed.   I spent 15 minutes with the patient. 50% of this time was spent discussing her CPAP download  Ward Givens, MSN, NP-C 04/10/2018, 8:40 AM [facility] [address] [place], [place] [address] [phone]

## 2018-04-10 NOTE — Progress Notes (Signed)
Received fax confirmation [facility] new cpap orders. [phone].

## 2018-04-10 NOTE — Patient Instructions (Signed)
Your Plan:  Continue using CPAP nightly and >4 hours each night Will adjust pressure 5-15 cm H20 If your symptoms worsen or you develop new symptoms please let us know.   Please call [facility] at [phone], and press option 1 when prompted. Their customer service representatives will be glad to assist you. If they are unable to answer, please leave a message and they will call you back. Make sure to leave your name and return phone number.   Thank you for coming to see [name] at [facility]. I hope we have been able to provide you high quality care today.  You may receive a patient satisfaction survey over the next few weeks. We would appreciate your feedback and comments so that we may continue to improve ourselves and the health of our patients.

## 2018-04-11 ENCOUNTER — Telehealth: Payer: Self-pay | Admitting: Neurology

## 2018-04-11 ENCOUNTER — Encounter: Payer: Self-pay | Admitting: Neurology

## 2018-04-11 ENCOUNTER — Ambulatory Visit (INDEPENDENT_AMBULATORY_CARE_PROVIDER_SITE_OTHER): Payer: Medicare Other | Admitting: Neurology

## 2018-04-11 VITALS — BP 137/67 | HR 68 | Ht 62.0 in | Wt 224.5 lb

## 2018-04-11 DIAGNOSIS — I6523 Occlusion and stenosis of bilateral carotid arteries: Secondary | ICD-10-CM

## 2018-04-11 DIAGNOSIS — H539 Unspecified visual disturbance: Secondary | ICD-10-CM

## 2018-04-11 DIAGNOSIS — R202 Paresthesia of skin: Secondary | ICD-10-CM

## 2018-04-11 NOTE — Progress Notes (Signed)
PATIENT: Angel French DOB: [birth year]  Chief Complaint  Patient presents with  . Facial Numbness    Her right-sided facial numbness has resolved but she is still having problems with drooling.  She would like to review her brain MRI on 02/04/18.  Marland Kitchen PCP    Angel Honour, MD     HISTORICAL  Angel French is a 67 year old female, seen in refer by her primary care doctor Angel French, for evaluation of right facial numbness, initial evaluation was on April 11, 2018.  She had past medical history of insulin-dependent diabetes, hypertension, hyperlipidemia, obesity, obstructive sleep apnea, using CPAP machine, has been taking aspirin 325 mg daily for long time, also has rheumatoid arthritis, taking prednisone 5 mg as needed,  She reported long history of migraine headache, her typical migraines lateralized severe pounding headaches, with associated severe nausea vomiting, lasting for few hours, movement made it worse, she used to have migraine once or twice each year, much improved since her hysterectomy many years ago, she usually does not have significant visual auras with her migraine,  In April 2019, when she was having dinner with her friends at [employer], she suddenly saw a bolt of bright light descending from left upper visual field,followed by mild bilateral frontal pressure headaches, then bilateral occipital area pressure headaches, but she denies significant light noise sensitivity, no nauseous, the left visual field change only last for a few minutes, but the headache last for a few days, she also felt stiffness of her neck, next morning, she woke up noticed right facial numbness, weakness, drooling from the right mouth corner, right facial paresthesia and weakness last about 4 weeks.  She was seen by her primary care physician, referred for MRI of the brain on February 04, 2018, there was no acute abnormality, incidental finding of a 4 mm focus of gliosis with a tiny cyst in the  right posterior frontal insular region,  Echocardiogram on February 13, 2018 showed ejection fraction 60 to 65%, no regional wall motion abnormality.  Ultrasound of carotid arteries showed bilateral internal carotid artery less than 39% stenosis, bilateral vertebral artery were anterograde flow,  REVIEW OF SYSTEMS: Full 14 system review of systems performed and notable only for drooling  ALLERGIES: Allergies  Allergen Reactions  . Codeine Anaphylaxis  . Contrast Media [Iodinated Diagnostic Agents] Anaphylaxis  . Nitrofurantoin Monohyd Macro Anaphylaxis  . Betadine [Povidone Iodine] Itching  . Folic Acid Itching  . Gabapentin Other (See Comments)    Makes patient feel drunk  . Iodine Hives  . Lyrica [Pregabalin] Other (See Comments)    Makes patient feel drunk  . Red Dye Itching  . Ultram [Tramadol Hcl] Nausea And Vomiting    HOME MEDICATIONS: Current Outpatient Medications  Medication Sig Dispense Refill  . acetaminophen (TYLENOL) 325 MG tablet Take 650 mg by mouth every 6 (six) hours as needed (every 6 weeks before RA infusion).    Marland Kitchen allopurinol (ZYLOPRIM) 100 MG tablet Take 1 tablet (100 mg total) by mouth daily. Ov needed (Patient taking differently: Take 200 mg by mouth daily. ) 90 tablet 3  . aspirin 325 MG tablet Take 325 mg by mouth daily.     . Blood Glucose Monitoring Suppl (BLOOD GLUCOSE METER KIT AND SUPPLIES) KIT Dispense based on patient and insurance preference. Use up to four times daily as directed. (FOR ICD-9 250.00, 250.01). 1 each 11  . Cholecalciferol (VITAMIN D3) 5000 units CAPS Take 1 capsule by mouth daily.    Marland Kitchen  diclofenac sodium (VOLTAREN) 1 % GEL Apply 2 g topically 4 (four) times daily. (Patient taking differently: Apply 2 g topically 2 (two) times daily as needed (pain). ) 100 g 3  . diphenhydrAMINE (BENADRYL) 25 MG tablet Take 25 mg by mouth every 6 (six) hours as needed (every 6 weeks prior to RA infusion).    . DULoxetine (CYMBALTA) 30 MG capsule TAKE 1  CAPSULE BY MOUTH EVERY DAY 90 capsule 0  . escitalopram (LEXAPRO) 20 MG tablet TAKE 1 TABLET BY MOUTH EVERY DAY 90 tablet 1  . furosemide (LASIX) 20 MG tablet TAKE 1 TABLET BY MOUTH EVERY DAY 90 tablet 0  . glucose blood test strip Check sugar three times daily  Dx: DMII insulin dependent with retinopathy, neuropathy controlled 300 each 3  . HYDROcodone-acetaminophen (NORCO) 10-325 MG tablet Take 1 tablet by mouth every 12 (twelve) hours as needed. 60 tablet 0  . Insulin Syringes, Disposable, U-100 0.5 ML MISC 28 Units by Does not apply route 2 (two) times daily. 100 each 11  . leflunomide (ARAVA) 20 MG tablet Take 20 mg by mouth daily.     Marland Kitchen LEVEMIR 100 UNIT/ML injection USE 60 UNITS AS DIRECTED AT BEDTIME 20 mL 1  . Needles & Syringes MISC 1 Syringe by Does not apply route 2 (two) times daily. 100 each 11  . NOVOLOG 100 UNIT/ML injection INJECT 15-20 UNITS SUBCUTANEOUSLY 3 TIMES DAILY PER SLIDING SCALE (Patient taking differently: INJECT 10 UNITS SUBCUTANEOUSLY 3 TIMES DAILY UNLESS BS LESS THAN 100) 10 mL 7  . omeprazole (PRILOSEC) 20 MG capsule TAKE 1 CAPSULE BY MOUTH EVERY DAY 90 capsule 3  . oxybutynin (DITROPAN XL) 15 MG 24 hr tablet TAKE 1 TABLET BY MOUTH AT BEDTIME 90 tablet 3  . predniSONE (DELTASONE) 5 MG tablet Take 5 mg by mouth daily with breakfast. Only takes with RA Flare.    . rosuvastatin (CRESTOR) 10 MG tablet Take 1 tablet (10 mg total) by mouth daily. 90 tablet 1  . traZODone (DESYREL) 100 MG tablet TAKE 2 TABLETS BY MOUTH EVERY DAY AT BEDTIME 180 tablet 1  . ULTICARE INSULIN SYRINGE 31G X 5/16" 0.5 ML MISC USE AS DIRECTED TO INJECT INSULIN 2 TIMES DAILY 100 each 4   No current facility-administered medications for this visit.     PAST MEDICAL HISTORY: Past Medical History:  Diagnosis Date  . Allergy    generic allergy pill; Spring and Fall only.  . Anxiety   . Arthritis    DDD lumbar, R hip OA.  s/p ortho consult in past.  . Blood transfusion without reported  diagnosis    Mountain climbing accident in [place].  . Brachial plexus disorders   . Cataract    B retractions.  . Chronic kidney disease    stage 3 per pt.   . Chronic pain syndrome   . Chronic renal insufficiency, stage 3 (moderate) (HCC)   . Constipation   . DDD (degenerative disc disease), lumbar   . Depression   . Diabetes mellitus   . Diabetic peripheral neuropathy associated with type 2 diabetes mellitus ([place])   . Diabetic retinopathy ([place])   . Diabetic retinopathy associated with type 2 diabetes mellitus ([place])    s/p laser treatment multiple.  Unable to drive.  . Fatty liver   . Fibromyalgia   . Food allergy   . GERD (gastroesophageal reflux disease)   . Hypercholesteremia   . Hyperlipidemia   . Hypertension    controlled, off meds   .  IBS (irritable bowel syndrome)   . Leg edema   . Neuromuscular disorder ([place])   . OSA (obstructive sleep apnea)   . Osteoarthritis   . Rheumatic fever   . Rheumatoid arthritis ([place])   . Stomach ulcer   . Swallowing difficulty   . TIA (transient ischemic attack)   . Ulcer    Peptic ulcer H. Pylori + s/p treatment.  Upper GI diagnosed.[name] Prilosec PRN .    PAST SURGICAL HISTORY: Past Surgical History:  Procedure Laterality Date  .  2 SPINAL INJECTIONS     . ABDOMINAL HYSTERECTOMY  11/02/1979   DUB; cervical dysplasia; ovaries intact.  . ABDOMINAL SURGERY     staph abcess   . [facility] Ad[facility]     age 13; three months in [place].  Marland Kitchen BREAST BIOPSY    . CARDIAC CATHETERIZATION  11/02/2007   normal coronary arteries.  . CARPAL TUNNEL RELEASE     Bilateral.  . CATARACT EXTRACTION, BILATERAL    . CHOLECYSTECTOMY    . ESOPHAGEAL MANOMETRY N/A 09/14/2017   Procedure: ESOPHAGEAL MANOMETRY (EM);  Surgeon: Ronnette Juniper, MD;  Location: [facility];  Service: Gastroenterology;  Laterality: N/A;  . EYE SURGERY     Cataracts B. Laser surgery x 7 for Diabetic Retinopathy  . TONSILLECTOMY      FAMILY HISTORY: Family  History  Adopted: Yes  Family history unknown: Yes    SOCIAL HISTORY:  Social History   Socioeconomic History  . Marital status: Widowed    Spouse name: Marijean Niemann  . Number of children: 2  . Years of education: college  . Highest education level: Not on file  Occupational History  . Occupation: retired    Comment: retretied  Social Needs  . Financial resource strain: Not on file  . Food insecurity:    Worry: Not on file    Inability: Not on file  . Transportation needs:    Medical: Not on file    Non-medical: Not on file  Tobacco Use  . Smoking status: Former Research scientist (life sciences)  . Smokeless tobacco: Never Used  . Tobacco comment: Quit 1987  Substance and Sexual Activity  . Alcohol use: No    Alcohol/week: 0.0 oz  . Drug use: No  . Sexual activity: Yes    Birth control/protection: Surgical, Post-menopausal    Comment: widow  Lifestyle  . Physical activity:    Days per week: Not on file    Minutes per session: Not on file  . Stress: Not on file  Relationships  . Social connections:    Talks on phone: Not on file    Gets together: Not on file    Attends religious service: Not on file    Active member of club or organization: Not on file    Attends meetings of clubs or organizations: Not on file    Relationship status: Not on file  . Intimate partner violence:    Fear of current or ex partner: Not on file    Emotionally abused: Not on file    Physically abused: Not on file    Forced sexual activity: Not on file  Other Topics Concern  . Not on file  Social History Narrative   Marital status: widowed since 2009; dating x 6 years.  Happy; no abuse.      Children: 2 children (66 daughter, 27 son estranged); 2 grandchildren.      Lives: with boyfriend, daughter, granddaughter, friend of daughter.  Lives in  pt house.      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  Diabetic retinopathy; unable to drive.      Tobacco:  Smoked x 20 years; quit 20 years.       Alcohol:  On special occasions; once per week on average.       Drugs:  None since college.      Exercise:  Walking several times per week; walks the dog.   Education college   Caffeine one cup daily.   Right handed      Advanced Directives: none; FULL CODE.  DNR/DNI.  HCPOA: [place]?             PHYSICAL EXAM   Vitals:   04/11/18 1100  BP: 137/67  Pulse: 68  Weight: 224 lb 8 oz (101.8 kg)  Height: '5\' 2"'$  (1.575 m)    Not recorded      Body mass index is 41.06 kg/m.  PHYSICAL EXAMNIATION:  Gen: NAD, conversant, well nourised, obese, well groomed                     Cardiovascular: Regular rate rhythm, no peripheral edema, warm, nontender. Eyes: Conjunctivae clear without exudates or hemorrhage Neck: Supple, no carotid bruits. Pulmonary: Clear to auscultation bilaterally   NEUROLOGICAL EXAM:  MENTAL STATUS: Speech:    Speech is normal; fluent and spontaneous with normal comprehension.  Cognition:     Orientation to time, place and person     Normal recent and remote memory     Normal Attention span and concentration     Normal Language, naming, repeating,spontaneous speech     Fund of knowledge   CRANIAL NERVES: CN II: Visual fields are full to confrontation. Fundoscopic exam is normal with sharp discs and no vascular changes. Pupils are round equal and briskly reactive to light. CN III, IV, VI: extraocular movement are normal. No ptosis. CN V: Facial sensation is intact to pinprick in all 3 divisions bilaterally. Corneal responses are intact.  CN VII: Face is symmetric with normal eye closure and smile. CN VIII: Hearing is normal to rubbing fingers CN IX, X: Palate elevates symmetrically. Phonation is normal. CN XI: Head turning and shoulder shrug are intact CN XII: Tongue is midline with normal movements and no atrophy.  MOTOR: There is no pronator drift of out-stretched arms. Muscle bulk and tone are normal. Muscle strength is  normal.  REFLEXES: Reflexes are 2+ and symmetric at the biceps, triceps, knees, and ankles. Plantar responses are flexor.  SENSORY: Intact to light touch, pinprick, positional sensation and vibratory sensation are intact in fingers and toes.  COORDINATION: Rapid alternating movements and fine finger movements are intact. There is no dysmetria on finger-to-nose and heel-knee-shin.    GAIT/STANCE: Need to push up to get up from seated position, mildly unsteady, due to antalgic gait, and her big body habitus  DIAGNOSTIC DATA (LABS, IMAGING, TESTING) - I reviewed patient records, labs, notes, testing and imaging myself where available.   ASSESSMENT AND PLAN  MECHELLE PATES is a 67 y.o. female  Sudden onset left upper visual field change, followed by headaches, neck stiffness, Sudden onset right facial numbness, weakness seen April 2019  She does have multiple vascular risk factors, this include hypertension, hyperlipidemia, diabetes, obesity,  MRI of the brain showed no acute abnormality,  Also need to rule out subarachnoid hemorrhage, proceed with MRA of brain     Marcial Pacas, M.D. Ph.D.  [facility] [address], [place], [place] [address] Ph: [phone] Fax: [phone]  CC: Referring Provider

## 2018-04-11 NOTE — Telephone Encounter (Signed)
Medicare/Eve[facility] supp order sent to GI. No auth they will reach out to pt to schedule.

## 2018-04-12 ENCOUNTER — Other Ambulatory Visit: Payer: Self-pay

## 2018-04-12 ENCOUNTER — Encounter: Payer: Self-pay | Admitting: Family Medicine

## 2018-04-12 ENCOUNTER — Ambulatory Visit (INDEPENDENT_AMBULATORY_CARE_PROVIDER_SITE_OTHER): Payer: Medicare Other | Admitting: Family Medicine

## 2018-04-12 VITALS — BP 122/72 | HR 112 | Temp 98.0°F | Resp 16 | Ht 62.4 in | Wt 220.0 lb

## 2018-04-12 DIAGNOSIS — F329 Major depressive disorder, single episode, unspecified: Secondary | ICD-10-CM

## 2018-04-12 DIAGNOSIS — M5136 Other intervertebral disc degeneration, lumbar region: Secondary | ICD-10-CM

## 2018-04-12 DIAGNOSIS — I10 Essential (primary) hypertension: Secondary | ICD-10-CM | POA: Diagnosis not present

## 2018-04-12 DIAGNOSIS — E1142 Type 2 diabetes mellitus with diabetic polyneuropathy: Secondary | ICD-10-CM

## 2018-04-12 DIAGNOSIS — G8929 Other chronic pain: Secondary | ICD-10-CM

## 2018-04-12 DIAGNOSIS — E7800 Pure hypercholesterolemia, unspecified: Secondary | ICD-10-CM

## 2018-04-12 DIAGNOSIS — R200 Anesthesia of skin: Secondary | ICD-10-CM

## 2018-04-12 DIAGNOSIS — I6523 Occlusion and stenosis of bilateral carotid arteries: Secondary | ICD-10-CM | POA: Diagnosis not present

## 2018-04-12 DIAGNOSIS — N183 Chronic kidney disease, stage 3 unspecified: Secondary | ICD-10-CM

## 2018-04-12 LAB — POCT URINALYSIS DIP (MANUAL ENTRY)
BILIRUBIN UA: NEGATIVE mg/dL
Bilirubin, UA: NEGATIVE
Blood, UA: NEGATIVE
GLUCOSE UA: NEGATIVE mg/dL
Nitrite, UA: POSITIVE — AB
Protein Ur, POC: NEGATIVE mg/dL
Spec Grav, UA: 1.015 (ref 1.010–1.025)
Urobilinogen, UA: 0.2 E.U./dL
pH, UA: 5.5 (ref 5.0–8.0)

## 2018-04-12 MED ORDER — HYDROCODONE-ACETAMINOPHEN 10-325 MG PO TABS: 1.0000 | ORAL_TABLET | Freq: Two times a day (BID) | ORAL | 0 refills | Status: DC | PRN

## 2018-04-12 MED ORDER — FUROSEMIDE 20 MG PO TABS: 20.0000 mg | ORAL_TABLET | Freq: Every day | ORAL | 1 refills | Status: DC

## 2018-04-12 MED ORDER — INSULIN ASPART 100 UNIT/ML ~~LOC~~ SOLN: 25.0000 [IU] | Freq: Three times a day (TID) | SUBCUTANEOUS | 11 refills | Status: DC

## 2018-04-12 NOTE — Progress Notes (Signed)
Subjective:    Patient ID: Angel French, female    DOB: 08-01-1951, 67 y.o.   MRN: [id number]  04/12/2018  Chronic Conditions (3 month follow-up )    HPI This 67 y.o. female presents for evaluation of DMII, chronic pain syndrome due to DDD lumbar spine, anxiety with depression.   Levemir now 55units daily per weight loss management. Using Novolog 15-20units with each meal.  Saw neurology yesterday; wants an angiogram of brain; 35m cyst; still drooling on RIGHT; Scheduled for 04/25/18.    Still sadness.  Cannot let him go; grieving.  Six months. Feels like should be better.  Big family. Always with time doing something.  [place]; does not want to go.   [place]; decoration day. Cleans graves; really hard.    Not losing weight; eating right; doing everything. Very discouraging.   Eating right is easy unless going out with friends. Sticking to it; only cheating three times in three weeks; had grits; then went 35 calories over.  Then ate way too many vegetables. Started five weeks ago.    Pain has worsened; does not want to ask for another pill.  In warmer pills, refuses to take more medication; bid opiate holds it.  Will get lower back pain in middle of the day. Takes ES Tylenol during the day and getting by.    BP Readings from Last 3 Encounters:  05/23/18 112/61  05/01/18 137/75  04/13/18 138/84   Wt Readings from Last 3 Encounters:  05/23/18 216 lb (98 kg)  05/01/18 217 lb (98.4 kg)  04/13/18 221 lb (100.2 kg)   Immunization History  Administered Date(s) Administered  . Hepatitis A, Adult 10/06/2015, 08/12/2016  . Influenza,inj,Quad PF,6+ Mos 07/16/2013, 07/10/2014, 10/15/2015, 08/12/2016, 06/28/2017  . Pneumococcal Conjugate-13 03/23/2017  . Pneumococcal Polysaccharide-23 07/10/2014  . Tdap 10/06/2015    Review of Systems  Constitutional: Negative for activity  change, appetite change, chills, diaphoresis, fatigue, fever and unexpected weight change.  HENT: Negative for congestion, dental problem, drooling, ear discharge, ear pain, facial swelling, hearing loss, mouth sores, nosebleeds, postnasal drip, rhinorrhea, sinus pressure, sneezing, sore throat, tinnitus, trouble swallowing and voice change.   Eyes: Negative for photophobia, pain, discharge, redness, itching and visual disturbance.  Respiratory: Negative for apnea, cough, choking, chest tightness, shortness of breath, wheezing and stridor.   Cardiovascular: Negative for chest pain, palpitations and leg swelling.  Gastrointestinal: Negative for abdominal distention, abdominal pain, anal bleeding, blood in stool, constipation, diarrhea, nausea, rectal pain and vomiting.  Endocrine: Negative for cold intolerance, heat intolerance, polydipsia, polyphagia and polyuria.  Genitourinary: Negative for decreased urine volume, difficulty urinating, dyspareunia, dysuria, enuresis, flank pain, frequency, genital sores, hematuria, menstrual problem, pelvic pain, urgency, vaginal bleeding, vaginal discharge and vaginal pain.  Musculoskeletal: Positive for back pain. Negative for arthralgias, gait problem, joint swelling, myalgias, neck pain and neck stiffness.  Skin: Negative for color change, pallor, rash and wound.  Allergic/Immunologic: Negative for environmental allergies, food allergies and immunocompromised state.  Neurological: Positive for weakness and numbness. Negative for dizziness, tremors, seizures, syncope, facial asymmetry, speech difficulty, light-headedness and headaches.  Hematological: Negative for adenopathy. Does not bruise/bleed easily.  Psychiatric/Behavioral: Positive for dysphoric mood. Negative for agitation, behavioral problems, confusion, decreased concentration, hallucinations, self-injury, sleep disturbance and suicidal ideas. The patient is not nervous/anxious and is not hyperactive.      Past Medical History:  Diagnosis  Date  . Allergy    generic allergy pill; Spring and Fall only.  . Anxiety   . Arthritis    DDD lumbar, R hip OA.  s/p ortho consult in past.  . Blood transfusion without reported diagnosis    Mountain climbing accident in [place].  . Brachial plexus disorders   . Cataract    B retractions.  . Chronic kidney disease    stage 3 per pt.   . Chronic pain syndrome   . Chronic renal insufficiency, stage 3 (moderate) (HCC)   . Constipation   . DDD (degenerative disc disease), lumbar   . Depression   . Diabetes mellitus   . Diabetic peripheral neuropathy associated with type 2 diabetes mellitus ([place])   . Diabetic retinopathy ([place])   . Diabetic retinopathy associated with type 2 diabetes mellitus ([place])    s/p laser treatment multiple.  Unable to drive.  . Fatty liver   . Fibromyalgia   . Food allergy   . GERD (gastroesophageal reflux disease)   . Hypercholesteremia   . Hyperlipidemia   . Hypertension    controlled, off meds   . IBS (irritable bowel syndrome)   . Leg edema   . Neuromuscular disorder ([place])   . OSA (obstructive sleep apnea)   . Osteoarthritis   . Rheumatic fever   . Rheumatoid arthritis ([place])   . Stomach ulcer   . Swallowing difficulty   . TIA (transient ischemic attack)   . Ulcer    Peptic ulcer H. Pylori + s/p treatment.  Upper GI diagnosed.[name] Prilosec PRN .   Past Surgical History:  Procedure Laterality Date  .  2 SPINAL INJECTIONS     . ABDOMINAL HYSTERECTOMY  11/02/1979   DUB; cervical dysplasia; ovaries intact.  . ABDOMINAL SURGERY     staph abcess   . [facility] Ad[facility]     age 37; three months in [place].  Marland Kitchen BREAST BIOPSY    . CARDIAC CATHETERIZATION  11/02/2007   normal coronary arteries.  . CARPAL TUNNEL RELEASE     Bilateral.  . CATARACT EXTRACTION, BILATERAL    . CHOLECYSTECTOMY    . ESOPHAGEAL MANOMETRY N/A 09/14/2017   Procedure: ESOPHAGEAL MANOMETRY (EM);  Surgeon: Ronnette Juniper, MD;   Location: [facility];  Service: Gastroenterology;  Laterality: N/A;  . EYE SURGERY     Cataracts B. Laser surgery x 7 for Diabetic Retinopathy  . TONSILLECTOMY     Allergies  Allergen Reactions  . Codeine Anaphylaxis  . Contrast Media [Iodinated Diagnostic Agents] Anaphylaxis  . Nitrofurantoin Monohyd Macro Anaphylaxis  . Betadine [Povidone Iodine] Itching  . Folic Acid Itching  . Gabapentin Other (See Comments)    Makes patient feel drunk  . Iodine Hives  . Lyrica [Pregabalin] Other (See Comments)    Makes patient feel drunk  . Red Dye Itching  . Ultram [Tramadol Hcl] Nausea And Vomiting   Current Outpatient Medications on File Prior to Visit  Medication Sig Dispense Refill  . acetaminophen (TYLENOL) 325 MG tablet Take 650 mg by mouth every 6 (six) hours as needed (every 6 weeks before RA infusion).    Marland Kitchen allopurinol (ZYLOPRIM) 100 MG tablet Take 1 tablet (100 mg total) by mouth daily. Ov needed (Patient taking differently: Take 200 mg by mouth daily. ) 90 tablet 3  . aspirin 325 MG tablet Take 325 mg by mouth daily.     . Blood Glucose Monitoring Suppl (BLOOD GLUCOSE METER KIT AND SUPPLIES) KIT Dispense  based on patient and insurance preference. Use up to four times daily as directed. (FOR ICD-9 250.00, 250.01). 1 each 11  . Cholecalciferol (VITAMIN D3) 5000 units CAPS Take 1 capsule by mouth daily.    . diclofenac sodium (VOLTAREN) 1 % GEL Apply 2 g topically 4 (four) times daily. (Patient taking differently: Apply 2 g topically 2 (two) times daily as needed (pain). ) 100 g 3  . diphenhydrAMINE (BENADRYL) 25 MG tablet Take 25 mg by mouth every 6 (six) hours as needed (every 6 weeks prior to RA infusion).    Marland Kitchen escitalopram (LEXAPRO) 20 MG tablet TAKE 1 TABLET BY MOUTH EVERY DAY 90 tablet 1  . glucose blood test strip Check sugar three times daily  Dx: DMII insulin dependent with retinopathy, neuropathy controlled 300 each 3  . Insulin Syringes, Disposable, U-100 0.5 ML MISC 28 Units  by Does not apply route 2 (two) times daily. 100 each 11  . leflunomide (ARAVA) 20 MG tablet Take 20 mg by mouth daily.     . Needles & Syringes MISC 1 Syringe by Does not apply route 2 (two) times daily. 100 each 11  . omeprazole (PRILOSEC) 20 MG capsule TAKE 1 CAPSULE BY MOUTH EVERY DAY 90 capsule 3  . oxybutynin (DITROPAN XL) 15 MG 24 hr tablet TAKE 1 TABLET BY MOUTH AT BEDTIME 90 tablet 3  . predniSONE (DELTASONE) 5 MG tablet Take 5 mg by mouth daily with breakfast. Only takes with RA Flare.    . rosuvastatin (CRESTOR) 10 MG tablet Take 1 tablet (10 mg total) by mouth daily. 90 tablet 1   No current facility-administered medications on file prior to visit.    Social History   Socioeconomic History  . Marital status: Widowed    Spouse name: Marijean Niemann  . Number of children: 2  . Years of education: college  . Highest education level: Not on file  Occupational History  . Occupation: retired    Comment: retretied  Social Needs  . Financial resource strain: Not on file  . Food insecurity:    Worry: Not on file    Inability: Not on file  . Transportation needs:    Medical: Not on file    Non-medical: Not on file  Tobacco Use  . Smoking status: Former Research scientist (life sciences)  . Smokeless tobacco: Never Used  . Tobacco comment: Quit 1987  Substance and Sexual Activity  . Alcohol use: No    Alcohol/week: 0.0 oz  . Drug use: No  . Sexual activity: Yes    Birth control/protection: Surgical, Post-menopausal    Comment: widow  Lifestyle  . Physical activity:    Days per week: Not on file    Minutes per session: Not on file  . Stress: Not on file  Relationships  . Social connections:    Talks on phone: Not on file    Gets together: Not on file    Attends religious service: Not on file    Active member of club or organization: Not on file    Attends meetings of clubs or organizations: Not on file    Relationship status: Not on file  . Intimate partner violence:    Fear of current or ex  partner: Not on file    Emotionally abused: Not on file    Physically abused: Not on file    Forced sexual activity: Not on file  Other Topics Concern  . Not on file  Social History Narrative   Marital status:  widowed since 2009; dating x 6 years.  Happy; no abuse.      Children: 2 children (25 daughter, 60 son estranged); 2 grandchildren.      Lives: with boyfriend, daughter, granddaughter, friend of daughter.  Lives in pt house.      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  Diabetic retinopathy; unable to drive.      Tobacco:  Smoked x 20 years; quit 20 years.      Alcohol:  On special occasions; once per week on average.       Drugs:  None since college.      Exercise:  Walking several times per week; walks the dog.   Education college   Caffeine one cup daily.   Right handed      Advanced Directives: none; FULL CODE.  DNR/DNI.  HCPOA: [place]?           Family History  Adopted: Yes  Family history unknown: Yes       Objective:    BP 122/72   Pulse (!) 112   Temp 98 F (36.7 C) (Oral)   Resp 16   Ht 5' 2.4" (1.585 m)   Wt 220 lb (99.8 kg)   SpO2 96%   BMI 39.72 kg/m  Physical Exam  Constitutional: She is oriented to person, place, and time. She appears well-developed and well-nourished. No distress.  HENT:  Head: Normocephalic and atraumatic.  Right Ear: External ear normal.  Left Ear: External ear normal.  Nose: Nose normal.  Mouth/Throat: Oropharynx is clear and moist.  Eyes: Pupils are equal, round, and reactive to light. Conjunctivae and EOM are normal.  Neck: Normal range of motion and full passive range of motion without pain. Neck supple. No JVD present. Carotid bruit is not present. No thyromegaly present.  Cardiovascular: Normal rate, regular rhythm, normal heart sounds and intact distal pulses. Exam reveals no gallop and no friction rub.  No murmur heard. Pulmonary/Chest: Effort normal and breath sounds normal. She has no  wheezes. She has no rales.  Abdominal: Soft. Bowel sounds are normal. She exhibits no distension and no mass. There is no tenderness. There is no rebound and no guarding.  Musculoskeletal:       Right shoulder: Normal.       Left shoulder: Normal.       Cervical back: Normal.  Lymphadenopathy:    She has no cervical adenopathy.  Neurological: She is alert and oriented to person, place, and time. She has normal reflexes. No cranial nerve deficit. She exhibits normal muscle tone. Coordination normal.  Skin: Skin is warm and dry. No rash noted. She is not diaphoretic. No erythema. No pallor.  Psychiatric: She has a normal mood and affect. Her behavior is normal. Judgment and thought content normal.  Nursing note and vitals reviewed.  No results found. Depression screen [facility] 2/9 05/23/2018 04/12/2018 03/07/2018 01/09/2018 12/03/2017  Decreased Interest 0 1 0 2 3  Down, Depressed, Hopeless 0 '1 1 3 3  '$ PHQ - 2 Score 0 '2 1 5 6  '$ Altered sleeping 0 1 0 1 3  Tired, decreased energy '1 1 1 3 3  '$ Change in appetite 0 0 '3 2 3  '$ Feeling bad or failure about yourself  0 0 0 0 0  Trouble concentrating 0 '1 1 2 3  '$ Moving slowly or fidgety/restless 0 1 0 1 0  Suicidal thoughts 0 0 0 0 0  PHQ-9 Score '1 6 6 14 '$ 18  Difficult doing work/chores - - Not difficult at all Somewhat difficult Very difficult  Some recent data might be hidden   Fall Risk  04/12/2018 01/30/2018 01/09/2018 12/03/2017 10/07/2017  Falls in the past year? Yes Yes Yes Yes Yes  Comment - - - - -  Number falls in past yr: 2 or more 2 or more 2 or more - 2 or more  Injury with Fall? Yes No No - No  Comment - - - - -  Risk Factor Category  - High Fall Risk High Fall Risk - -  Risk for fall due to : - - History of fall(s) - -  Follow up - - Falls evaluation completed;Falls prevention discussed - -        Assessment & Plan:   1. Diabetic peripheral neuropathy associated with type 2 diabetes mellitus ([place])   2. Essential hypertension, benign   3.  Pure hypercholesterolemia   4. Chronic renal impairment, stage 3 (moderate) (HCC)   5. Degenerative disc disease, lumbar   6. Encounter for chronic pain management   7. Right facial numbness   8. Reactive depression     DMII: moderately controlled; has been able to decrease insulin with weight loss attempts; obtain labs.  Refills provided.   Chronic renal insufficiency: stable; obtain labs; followed closely by nephrology.  Hypertension and hypercholesterolemia: controlled; obtain labs.  Chronic pain management secondary to DDD lumbar spine: worsening pain; resistant to increasing opiate intake. Refills provided.  R facial numbness: intermittent; continues to drool; s/p neurology consultation; scheduled for angiogram in upcoming weeks.   Orders Placed This Encounter  Procedures  . Pain Management Screening Profile (10S)  . POCT urinalysis dipstick   Meds ordered this encounter  Medications  . insulin aspart (NOVOLOG) 100 UNIT/ML injection    Sig: Inject 25 Units into the skin 3 (three) times daily with meals.    Dispense:  10 mL    Refill:  11  . DISCONTD: HYDROcodone-acetaminophen (NORCO) 10-325 MG tablet    Sig: Take 1 tablet by mouth every 12 (twelve) hours as needed.    Dispense:  60 tablet    Refill:  0    Do not fill for 60 days after prescribed  . DISCONTD: HYDROcodone-acetaminophen (NORCO) 10-325 MG tablet    Sig: Take 1 tablet by mouth every 12 (twelve) hours as needed.    Dispense:  60 tablet    Refill:  0    Do not fill for 30 days after prescribed  . HYDROcodone-acetaminophen (NORCO) 10-325 MG tablet    Sig: Take 1 tablet by mouth every 12 (twelve) hours as needed.    Dispense:  60 tablet    Refill:  0  . furosemide (LASIX) 20 MG tablet    Sig: Take 1-3 tablets (20-60 mg total) by mouth daily.    Dispense:  200 tablet    Refill:  1  . traZODone (DESYREL) 100 MG tablet    Sig: TAKE 2 TABLETS BY MOUTH EVERY DAY AT BEDTIME    Dispense:  180 tablet    Refill:   1    Return in about 3 months (around 07/13/2018) for follow-up chronic medical conditions [place].   Genessa Beman Elayne Guerin, M.D. Primary Care at [facility] [address] [place], [place]  [address] [phone] phone [phone] fax

## 2018-04-12 NOTE — Patient Instructions (Signed)
     IF you received an x-ray today, you will receive an invoice from [facility]. Please contact [facility] at [phone] with questions or concerns regarding your invoice.   IF you received labwork today, you will receive an invoice from [facility]. Please contact [facility] at [phone] with questions or concerns regarding your invoice.   Our billing staff will not be able to assist you with questions regarding bills from these companies.  You will be contacted with the lab results as soon as they are available. The fastest way to get your results is to activate your My Chart account. Instructions are located on the last page of this paperwork. If you have not heard from us regarding the results in 2 weeks, please contact this office.     

## 2018-04-13 ENCOUNTER — Ambulatory Visit (INDEPENDENT_AMBULATORY_CARE_PROVIDER_SITE_OTHER): Payer: Medicare Other | Admitting: Family Medicine

## 2018-04-13 VITALS — BP 138/84 | HR 90 | Temp 98.3°F | Ht 62.0 in | Wt 221.0 lb

## 2018-04-13 DIAGNOSIS — Z794 Long term (current) use of insulin: Secondary | ICD-10-CM

## 2018-04-13 DIAGNOSIS — Z6841 Body Mass Index (BMI) 40.0 and over, adult: Secondary | ICD-10-CM | POA: Diagnosis not present

## 2018-04-13 DIAGNOSIS — I10 Essential (primary) hypertension: Secondary | ICD-10-CM | POA: Diagnosis not present

## 2018-04-13 DIAGNOSIS — I6523 Occlusion and stenosis of bilateral carotid arteries: Secondary | ICD-10-CM

## 2018-04-13 DIAGNOSIS — E119 Type 2 diabetes mellitus without complications: Secondary | ICD-10-CM

## 2018-04-13 DIAGNOSIS — M0609 Rheumatoid arthritis without rheumatoid factor, multiple sites: Secondary | ICD-10-CM | POA: Diagnosis not present

## 2018-04-13 LAB — PMP SCREEN PROFILE (10S), URINE
AMPHETAMINE SCREEN URINE: NEGATIVE ng/mL
BARBITURATE SCREEN URINE: NEGATIVE ng/mL
BENZODIAZEPINE SCREEN, URINE: NEGATIVE ng/mL
CANNABINOIDS UR QL SCN: NEGATIVE ng/mL
COCAINE(METAB.)SCREEN, URINE: NEGATIVE ng/mL
Creatinine(Crt), U: 91.7 mg/dL (ref 20.0–300.0)
Methadone Screen, Urine: NEGATIVE ng/mL
OPIATE SCREEN URINE: POSITIVE ng/mL — AB
OXYCODONE+OXYMORPHONE UR QL SCN: NEGATIVE ng/mL
Ph of Urine: 5.8 (ref 4.5–8.9)
Phencyclidine Qn, Ur: NEGATIVE ng/mL
Propoxyphene Scrn, Ur: NEGATIVE ng/mL

## 2018-04-17 NOTE — Progress Notes (Signed)
Office: [phone]  /  Fax: [phone]   HPI:   Chief Complaint: OBESITY Angel French is here to discuss her progress with her obesity treatment plan. She is on the Category 2 plan plus 1/2 cup cottage cheese and is following her eating plan approximately 99.5 % of the time. She states she is exercising 0 minutes 0 times per week. [name] is frustrated with decrease in weight loss. Blood sugar is better controlled.  Her weight is 221 lb (100.2 kg) today and has had a weight loss of 2 pounds over a period of 3 to 4 weeks since her last visit. She has lost 3 lbs since starting treatment with [name].  Diabetes II [name] has a diagnosis of diabetes type II. [name] states fasting Bgs range between 85 and 189 and post prandial range between 66 and 227. She denies any hypoglycemic episodes. Last A1c was 8.1 on 03/07/18. She has been working on intensive lifestyle modifications including diet, exercise, and weight loss to help control her blood glucose levels.  Hypertension [name] is a 67 y.o. female with hypertension. [name]'s blood pressure is controlled today. She denies chest pain, chest pressure, or headache. She is working weight loss to help control her blood pressure with the goal of decreasing her risk of heart attack and stroke.   ALLERGIES: Allergies  Allergen Reactions  . Codeine Anaphylaxis  . Contrast Media [Iodinated Diagnostic Agents] Anaphylaxis  . Nitrofurantoin Monohyd Macro Anaphylaxis  . Betadine [Povidone Iodine] Itching  . Folic Acid Itching  . Gabapentin Other (See Comments)    Makes patient feel drunk  . Iodine Hives  . Lyrica [Pregabalin] Other (See Comments)    Makes patient feel drunk  . Red Dye Itching  . Ultram [Tramadol Hcl] Nausea And Vomiting    MEDICATIONS: Current Outpatient Medications on File Prior to Visit  Medication Sig Dispense Refill  . acetaminophen (TYLENOL) 325 MG tablet Take 650 mg by mouth every 6 (six) hours as needed (every 6 weeks  before RA infusion).    Marland Kitchen allopurinol (ZYLOPRIM) 100 MG tablet Take 1 tablet (100 mg total) by mouth daily. Ov needed (Patient taking differently: Take 200 mg by mouth daily. ) 90 tablet 3  . aspirin 325 MG tablet Take 325 mg by mouth daily.     . Blood Glucose Monitoring Suppl (BLOOD GLUCOSE METER KIT AND SUPPLIES) KIT Dispense based on patient and insurance preference. Use up to four times daily as directed. (FOR ICD-9 250.00, 250.01). 1 each 11  . Cholecalciferol (VITAMIN D3) 5000 units CAPS Take 1 capsule by mouth daily.    . diclofenac sodium (VOLTAREN) 1 % GEL Apply 2 g topically 4 (four) times daily. (Patient taking differently: Apply 2 g topically 2 (two) times daily as needed (pain). ) 100 g 3  . diphenhydrAMINE (BENADRYL) 25 MG tablet Take 25 mg by mouth every 6 (six) hours as needed (every 6 weeks prior to RA infusion).    . DULoxetine (CYMBALTA) 30 MG capsule TAKE 1 CAPSULE BY MOUTH EVERY DAY 90 capsule 0  . escitalopram (LEXAPRO) 20 MG tablet TAKE 1 TABLET BY MOUTH EVERY DAY 90 tablet 1  . furosemide (LASIX) 20 MG tablet Take 1-3 tablets (20-60 mg total) by mouth daily. 200 tablet 1  . glucose blood test strip Check sugar three times daily  Dx: DMII insulin dependent with retinopathy, neuropathy controlled 300 each 3  . HYDROcodone-acetaminophen (NORCO) 10-325 MG tablet Take 1 tablet by mouth every 12 (twelve) hours as  needed. 60 tablet 0  . insulin aspart (NOVOLOG) 100 UNIT/ML injection Inject 25 Units into the skin 3 (three) times daily with meals. 10 mL 11  . Insulin Syringes, Disposable, U-100 0.5 ML MISC 28 Units by Does not apply route 2 (two) times daily. 100 each 11  . leflunomide (ARAVA) 20 MG tablet Take 20 mg by mouth daily.     Marland Kitchen LEVEMIR 100 UNIT/ML injection USE 60 UNITS AS DIRECTED AT BEDTIME 20 mL 1  . Needles & Syringes MISC 1 Syringe by Does not apply route 2 (two) times daily. 100 each 11  . omeprazole (PRILOSEC) 20 MG capsule TAKE 1 CAPSULE BY MOUTH EVERY DAY 90  capsule 3  . oxybutynin (DITROPAN XL) 15 MG 24 hr tablet TAKE 1 TABLET BY MOUTH AT BEDTIME 90 tablet 3  . predniSONE (DELTASONE) 5 MG tablet Take 5 mg by mouth daily with breakfast. Only takes with RA Flare.    . rosuvastatin (CRESTOR) 10 MG tablet Take 1 tablet (10 mg total) by mouth daily. 90 tablet 1  . traZODone (DESYREL) 100 MG tablet TAKE 2 TABLETS BY MOUTH EVERY DAY AT BEDTIME 180 tablet 1  . ULTICARE INSULIN SYRINGE 31G X 5/16" 0.5 ML MISC USE AS DIRECTED TO INJECT INSULIN 2 TIMES DAILY 100 each 4   No current facility-administered medications on file prior to visit.     PAST MEDICAL HISTORY: Past Medical History:  Diagnosis Date  . Allergy    generic allergy pill; Spring and Fall only.  . Anxiety   . Arthritis    DDD lumbar, R hip OA.  s/p ortho consult in past.  . Blood transfusion without reported diagnosis    Mountain climbing accident in [place].  . Brachial plexus disorders   . Cataract    B retractions.  . Chronic kidney disease    stage 3 per pt.   . Chronic pain syndrome   . Chronic renal insufficiency, stage 3 (moderate) (HCC)   . Constipation   . DDD (degenerative disc disease), lumbar   . Depression   . Diabetes mellitus   . Diabetic peripheral neuropathy associated with type 2 diabetes mellitus ([place])   . Diabetic retinopathy ([place])   . Diabetic retinopathy associated with type 2 diabetes mellitus ([place])    s/p laser treatment multiple.  Unable to drive.  . Fatty liver   . Fibromyalgia   . Food allergy   . GERD (gastroesophageal reflux disease)   . Hypercholesteremia   . Hyperlipidemia   . Hypertension    controlled, off meds   . IBS (irritable bowel syndrome)   . Leg edema   . Neuromuscular disorder ([place])   . OSA (obstructive sleep apnea)   . Osteoarthritis   . Rheumatic fever   . Rheumatoid arthritis ([place])   . Stomach ulcer   . Swallowing difficulty   . TIA (transient ischemic attack)   . Ulcer    Peptic ulcer H. Pylori + s/p treatment.  Upper  GI diagnosed.[name] Prilosec PRN .    PAST SURGICAL HISTORY: Past Surgical History:  Procedure Laterality Date  .  2 SPINAL INJECTIONS     . ABDOMINAL HYSTERECTOMY  11/02/1979   DUB; cervical dysplasia; ovaries intact.  . ABDOMINAL SURGERY     staph abcess   . [facility] Admission     age 19; three months in [place].  Marland Kitchen BREAST BIOPSY    . CARDIAC CATHETERIZATION  11/02/2007   normal coronary arteries.  Marland Kitchen  CARPAL TUNNEL RELEASE     Bilateral.  . CATARACT EXTRACTION, BILATERAL    . CHOLECYSTECTOMY    . ESOPHAGEAL MANOMETRY N/A 09/14/2017   Procedure: ESOPHAGEAL MANOMETRY (EM);  Surgeon: Ronnette Juniper, MD;  Location: [facility];  Service: Gastroenterology;  Laterality: N/A;  . EYE SURGERY     Cataracts B. Laser surgery x 7 for Diabetic Retinopathy  . TONSILLECTOMY      SOCIAL HISTORY: Social History   Tobacco Use  . Smoking status: Former Research scientist (life sciences)  . Smokeless tobacco: Never Used  . Tobacco comment: Quit 1987  Substance Use Topics  . Alcohol use: No    Alcohol/week: 0.0 oz  . Drug use: No    FAMILY HISTORY: Family History  Adopted: Yes  Family history unknown: Yes    ROS: Review of Systems  Constitutional: Positive for weight loss.  Cardiovascular: Negative for chest pain.       Negative chest pressure  Neurological: Negative for headaches.    PHYSICAL EXAM: Blood pressure 138/84, pulse 90, temperature 98.3 F (36.8 C), temperature source Oral, height '5\' 2"'$  (1.575 m), weight 221 lb (100.2 kg), SpO2 97 %. Body mass index is 40.42 kg/m. Physical Exam  Constitutional: She is oriented to person, place, and time. She appears well-developed and well-nourished.  Cardiovascular: Normal rate.  Pulmonary/Chest: Effort normal.  Musculoskeletal: Normal range of motion.  Neurological: She is oriented to person, place, and time.  Skin: Skin is warm and dry.  Psychiatric: She has a normal mood and affect. Her behavior is normal.  Vitals reviewed.   RECENT  LABS AND TESTS: BMET    Component Value Date/Time   NA 140 03/07/2018 1310   K 3.7 03/07/2018 1310   CL 97 03/07/2018 1310   CO2 28 03/07/2018 1310   GLUCOSE 111 (H) 03/07/2018 1310   GLUCOSE 58 (L) 09/08/2016 1035   BUN 26 03/07/2018 1310   CREATININE 1.36 (H) 03/07/2018 1310   CREATININE 1.30 (H) 09/08/2016 1035   CALCIUM 9.4 03/07/2018 1310   GFRNONAA 41 (L) 03/07/2018 1310   GFRNONAA 36 (L) 07/10/2014 1354   GFRAA 47 (L) 03/07/2018 1310   GFRAA 41 (L) 07/10/2014 1354   Lab Results  Component Value Date   HGBA1C 8.1 (H) 03/07/2018   HGBA1C 11.0 01/09/2018   HGBA1C 8.0 10/07/2017   HGBA1C 7.0 06/28/2017   HGBA1C 7.3 (H) 03/23/2017   Lab Results  Component Value Date   INSULIN 6.2 03/07/2018   CBC    Component Value Date/Time   WBC 5.1 01/30/2018 1000   WBC 5.7 09/08/2016 1035   RBC 3.38 (L) 01/30/2018 1000   RBC 3.82 09/08/2016 1035   HGB 10.9 (L) 01/30/2018 1000   HCT 30.8 (L) 01/30/2018 1000   PLT 226 01/30/2018 1000   MCV 91 01/30/2018 1000   MCH 32.2 01/30/2018 1000   MCH 29.8 09/08/2016 1035   MCHC 35.4 01/30/2018 1000   MCHC 32.5 09/08/2016 1035   RDW 14.8 01/30/2018 1000   LYMPHSABS 1.7 01/30/2018 1000   MONOABS 513 09/08/2016 1035   EOSABS 0.3 01/30/2018 1000   BASOSABS 0.0 01/30/2018 1000   Iron/TIBC/Ferritin/ %Sat    Component Value Date/Time   IRON 79 02/03/2016 1015   TIBC 309 02/03/2016 1015   IRONPCTSAT 26 02/03/2016 1015   Lipid Panel     Component Value Date/Time   CHOL 168 03/07/2018 1310   TRIG 237 (H) 03/07/2018 1310   HDL 40 03/07/2018 1310   CHOLHDL 5.1 (H) 01/09/2018 1221  CHOLHDL 5.3 (H) 09/08/2016 1035   VLDL 46 (H) 09/08/2016 1035   LDLCALC 81 03/07/2018 1310   Hepatic Function Panel     Component Value Date/Time   PROT 7.4 03/07/2018 1310   ALBUMIN 4.1 03/07/2018 1310   AST 40 03/07/2018 1310   ALT 36 (H) 03/07/2018 1310   ALKPHOS 87 03/07/2018 1310   BILITOT 0.3 03/07/2018 1310      Component Value  Date/Time   TSH 0.958 03/07/2018 1310   TSH 0.874 01/30/2018 1000   TSH 1.060 03/23/2017 1230    ASSESSMENT AND PLAN: Type 2 diabetes mellitus without complication, with long-term current use of insulin (HCC)  Essential hypertension  Class 3 severe obesity with serious comorbidity and body mass index (BMI) of 40.0 to 44.9 in adult, unspecified obesity type ([place])  PLAN:  Diabetes II Willadene has been given extensive diabetes education by myself today including ideal fasting and post-prandial blood glucose readings, individual ideal Hgb A1c goals and hypoglycemia prevention. We discussed the importance of good blood sugar control to decrease the likelihood of diabetic complications such as nephropathy, neuropathy, limb loss, blindness, coronary artery disease, and death. We discussed the importance of intensive lifestyle modification including diet, exercise and weight loss as the first line treatment for diabetes. Tria agrees to continue Levemir 55 units daily and SSI for meals, and she agrees to follow up with our clinic in 2 weeks.  Hypertension We discussed sodium restriction, working on healthy weight loss, and a regular exercise program as the means to achieve improved blood pressure control. Anelise agreed with this plan and agreed to follow up as directed. We will continue to monitor her blood pressure as well as her progress with the above lifestyle modifications. She will watch for signs of hypotension as she continues her lifestyle modifications. Shevette agrees to follow up with our clinic in 2 weeks.  Obesity Agape is currently in the action stage of change. As such, her goal is to continue with weight loss efforts She has agreed to follow the Category 3 plan Chestine has been instructed to work up to a goal of 150 minutes of combined cardio and strengthening exercise per week for weight loss and overall health benefits. We discussed the following Behavioral Modification Strategies  today: increasing lean protein intake, increasing vegetables, work on meal planning and easy cooking plans, and planning for success   Ashwini has agreed to follow up with our clinic in 2 weeks. She was informed of the importance of frequent follow up visits to maximize her success with intensive lifestyle modifications for her multiple health conditions.   OBESITY BEHAVIORAL INTERVENTION VISIT  Today's visit was # 3 out of 22.  Starting weight: 224 lbs Starting date: 03/07/18 Today's weight : 221 lbs  Today's date: 04/13/2018 Total lbs lost to date: 3 (Patients must lose 7 lbs in the first 6 months to continue with counseling)   ASK: We discussed the diagnosis of obesity with Chelli S Kinton today and Chellie agreed to give [name] permission to discuss obesity behavioral modification therapy today.  ASSESS: Daylyn has the diagnosis of obesity and her BMI today is 40.41 Dayannara is in the action stage of change   ADVISE: Faustina was educated on the multiple health risks of obesity as well as the benefit of weight loss to improve her health. She was advised of the need for long term treatment and the importance of lifestyle modifications.  AGREE: Multiple dietary modification options and treatment options were discussed  and  Deshawna agreed to the above obesity treatment plan.  I, Trixie Dredge, am acting as transcriptionist for Ilene Qua, MD  I have reviewed the above documentation for accuracy and completeness, and I agree with the above. - Ilene Qua, MD

## 2018-04-19 ENCOUNTER — Other Ambulatory Visit: Payer: Self-pay | Admitting: Family Medicine

## 2018-04-21 NOTE — Progress Notes (Signed)
I agree with the assessment and plan as directed by NP .The patient is known to me .   Meleana Commerford, MD  

## 2018-04-25 ENCOUNTER — Ambulatory Visit
Admission: RE | Admit: 2018-04-25 | Discharge: 2018-04-25 | Disposition: A | Discharge status: Home / Self Care | Payer: Medicare Other | Source: Ambulatory Visit | Attending: Neurology | Admitting: Neurology

## 2018-04-25 DIAGNOSIS — H539 Unspecified visual disturbance: Secondary | ICD-10-CM | POA: Diagnosis not present

## 2018-04-25 DIAGNOSIS — R202 Paresthesia of skin: Secondary | ICD-10-CM

## 2018-04-25 DIAGNOSIS — R51 Headache: Secondary | ICD-10-CM | POA: Diagnosis not present

## 2018-05-01 ENCOUNTER — Ambulatory Visit (INDEPENDENT_AMBULATORY_CARE_PROVIDER_SITE_OTHER): Payer: Medicare Other | Admitting: Family Medicine

## 2018-05-01 ENCOUNTER — Other Ambulatory Visit: Payer: Self-pay | Admitting: Family Medicine

## 2018-05-01 VITALS — BP 137/75 | HR 73 | Temp 98.0°F | Ht 62.0 in | Wt 217.0 lb

## 2018-05-01 DIAGNOSIS — Z794 Long term (current) use of insulin: Secondary | ICD-10-CM

## 2018-05-01 DIAGNOSIS — Z6839 Body mass index (BMI) 39.0-39.9, adult: Secondary | ICD-10-CM | POA: Diagnosis not present

## 2018-05-01 DIAGNOSIS — I6523 Occlusion and stenosis of bilateral carotid arteries: Secondary | ICD-10-CM

## 2018-05-01 DIAGNOSIS — F3289 Other specified depressive episodes: Secondary | ICD-10-CM

## 2018-05-01 DIAGNOSIS — E119 Type 2 diabetes mellitus without complications: Secondary | ICD-10-CM | POA: Diagnosis not present

## 2018-05-01 NOTE — Progress Notes (Signed)
Office: [phone]  /  Fax: [phone]   HPI:   Chief Complaint: OBESITY Angel French is here to discuss her progress with her obesity treatment plan. She is on the Category 3 plan and is following her eating plan approximately 15 % of the time. She states she is walking more, when her hips allow her to. Angel French is leaving for [facility] on July 4th and returning on July 9th. She had a significant life stressor, with roof collapse while she was on vacation and she had significant emotional eating. Her weight is 217 lb (98.4 kg) today and has had a weight loss of 4 pounds over a period of 2 to 3 weeks since her last visit. She has lost 7 lbs since starting treatment with [name].  Diabetes II Angel French has a diagnosis of diabetes type II. Angel French lost her blood sugar log, so it is unclear what her blood sugars have been. Angel French denies any hypoglycemic episodes. Last A1c was at 8.1 She has been working on intensive lifestyle modifications including diet, exercise, and weight loss to help control her blood glucose levels.  Depression with emotional eating behaviors Angel French had lots of emotional eating after the roof collapse. She has struggled with emotional eating for years. She struggles  with emotional eating and using food for comfort to the extent that it is negatively impacting her health. She often snacks when she is not hungry. Angel French sometimes feels she is out of control and then feels guilty that she made poor food choices. She has been working on behavior modification techniques to help reduce her emotional eating and has been somewhat successful. She shows no sign of suicidal or homicidal ideations.  Depression screen [facility] 2/9 04/12/2018 03/07/2018 01/09/2018 12/03/2017 10/07/2017  Decreased Interest 1 0 _0 Down, Depressed, Hopeless _1 PHQ - 2 Score _2 Altered sleeping 1 0 _3 Tired, decreased energy _4 Change in appetite 0 _5 Feeling bad or failure about yourself  0 0 0  0 1  Trouble concentrating _6 0  Moving slowly or fidgety/restless 1 0 1 0 0  Suicidal thoughts 0 0 0 0 0  PHQ-9 Score _7 Difficult doing work/chores - Not difficult at all Somewhat difficult Very difficult -  Some recent data might be hidden     ALLERGIES: Allergies  Allergen Reactions  . Codeine Anaphylaxis  . Contrast Media [Iodinated Diagnostic Agents] Anaphylaxis  . Nitrofurantoin Monohyd Macro Anaphylaxis  . Betadine [Povidone Iodine] Itching  . Folic Acid Itching  . Gabapentin Other (See Comments)    Makes patient feel drunk  . Iodine Hives  . Lyrica [Pregabalin] Other (See Comments)    Makes patient feel drunk  . Red Dye Itching  . Ultram [Tramadol Hcl] Nausea And Vomiting    MEDICATIONS: Current Outpatient Medications on File Prior to Visit  Medication Sig Dispense Refill  . acetaminophen (TYLENOL) 325 MG tablet Take 650 mg by mouth every 6 (six) hours as needed (every 6 weeks before RA infusion).    Marland Kitchen allopurinol (ZYLOPRIM) 100 MG tablet Take 1 tablet (100 mg total) by mouth daily. Ov needed (Patient taking differently: Take 200 mg by mouth daily. ) 90 tablet 3  . aspirin 325 MG tablet Take 325 mg by mouth daily.     . Blood Glucose Monitoring Suppl (BLOOD GLUCOSE METER  KIT AND SUPPLIES) KIT Dispense based on patient and insurance preference. Use up to four times daily as directed. (FOR ICD-9 250.00, 250.01). 1 each 11  . Cholecalciferol (VITAMIN D3) 5000 units CAPS Take 1 capsule by mouth daily.    . diclofenac sodium (VOLTAREN) 1 % GEL Apply 2 g topically 4 (four) times daily. (Patient taking differently: Apply 2 g topically 2 (two) times daily as needed (pain). ) 100 g 3  . diphenhydrAMINE (BENADRYL) 25 MG tablet Take 25 mg by mouth every 6 (six) hours as needed (every 6 weeks prior to RA infusion).    . DULoxetine (CYMBALTA) 30 MG capsule TAKE 1 CAPSULE BY MOUTH EVERY DAY 90 capsule 3  . escitalopram (LEXAPRO) 20 MG tablet TAKE 1 TABLET BY MOUTH  EVERY DAY 90 tablet 1  . furosemide (LASIX) 20 MG tablet Take 1-3 tablets (20-60 mg total) by mouth daily. 200 tablet 1  . glucose blood test strip Check sugar three times daily  Dx: DMII insulin dependent with retinopathy, neuropathy controlled 300 each 3  . HYDROcodone-acetaminophen (NORCO) 10-325 MG tablet Take 1 tablet by mouth every 12 (twelve) hours as needed. 60 tablet 0  . insulin aspart (NOVOLOG) 100 UNIT/ML injection Inject 25 Units into the skin 3 (three) times daily with meals. 10 mL 11  . Insulin Syringes, Disposable, U-100 0.5 ML MISC 28 Units by Does not apply route 2 (two) times daily. 100 each 11  . leflunomide (ARAVA) 20 MG tablet Take 20 mg by mouth daily.     Marland Kitchen LEVEMIR 100 UNIT/ML injection USE 60 UNITS AS DIRECTED AT BEDTIME 20 mL 1  . Needles & Syringes MISC 1 Syringe by Does not apply route 2 (two) times daily. 100 each 11  . omeprazole (PRILOSEC) 20 MG capsule TAKE 1 CAPSULE BY MOUTH EVERY DAY 90 capsule 3  . oxybutynin (DITROPAN XL) 15 MG 24 hr tablet TAKE 1 TABLET BY MOUTH AT BEDTIME 90 tablet 3  . predniSONE (DELTASONE) 5 MG tablet Take 5 mg by mouth daily with breakfast. Only takes with RA Flare.    . rosuvastatin (CRESTOR) 10 MG tablet Take 1 tablet (10 mg total) by mouth daily. 90 tablet 1  . traZODone (DESYREL) 100 MG tablet TAKE 2 TABLETS BY MOUTH EVERY DAY AT BEDTIME 180 tablet 1  . ULTICARE INSULIN SYRINGE 31G X 5/16" 0.5 ML MISC USE AS DIRECTED TO INJECT INSULIN 2 TIMES DAILY 100 each 4   No current facility-administered medications on file prior to visit.     PAST MEDICAL HISTORY: Past Medical History:  Diagnosis Date  . Allergy    generic allergy pill; Spring and Fall only.  . Anxiety   . Arthritis    DDD lumbar, R hip OA.  s/p ortho consult in past.  . Blood transfusion without reported diagnosis    Mountain climbing accident in [place].  . Brachial plexus disorders   . Cataract    B retractions.  . Chronic kidney disease    stage 3 per pt.   .  Chronic pain syndrome   . Chronic renal insufficiency, stage 3 (moderate) (HCC)   . Constipation   . DDD (degenerative disc disease), lumbar   . Depression   . Diabetes mellitus   . Diabetic peripheral neuropathy associated with type 2 diabetes mellitus ([place])   . Diabetic retinopathy ([place])   . Diabetic retinopathy associated with type 2 diabetes mellitus ([place])    s/p laser treatment multiple.  Unable to drive.  Marland Kitchen  Fatty liver   . Fibromyalgia   . Food allergy   . GERD (gastroesophageal reflux disease)   . Hypercholesteremia   . Hyperlipidemia   . Hypertension    controlled, off meds   . IBS (irritable bowel syndrome)   . Leg edema   . Neuromuscular disorder ([place])   . OSA (obstructive sleep apnea)   . Osteoarthritis   . Rheumatic fever   . Rheumatoid arthritis ([place])   . Stomach ulcer   . Swallowing difficulty   . TIA (transient ischemic attack)   . Ulcer    Peptic ulcer H. Pylori + s/p treatment.  Upper GI diagnosed.[name] Prilosec PRN .    PAST SURGICAL HISTORY: Past Surgical History:  Procedure Laterality Date  .  2 SPINAL INJECTIONS     . ABDOMINAL HYSTERECTOMY  11/02/1979   DUB; cervical dysplasia; ovaries intact.  . ABDOMINAL SURGERY     staph abcess   . [facility] Admission     age 68; three months in [place].  Marland Kitchen BREAST BIOPSY    . CARDIAC CATHETERIZATION  11/02/2007   normal coronary arteries.  . CARPAL TUNNEL RELEASE     Bilateral.  . CATARACT EXTRACTION, BILATERAL    . CHOLECYSTECTOMY    . ESOPHAGEAL MANOMETRY N/A 09/14/2017   Procedure: ESOPHAGEAL MANOMETRY (EM);  Surgeon: Ronnette Juniper, MD;  Location: [facility];  Service: Gastroenterology;  Laterality: N/A;  . EYE SURGERY     Cataracts B. Laser surgery x 7 for Diabetic Retinopathy  . TONSILLECTOMY      SOCIAL HISTORY: Social History   Tobacco Use  . Smoking status: Former Research scientist (life sciences)  . Smokeless tobacco: Never Used  . Tobacco comment: Quit 1987  Substance Use Topics  . Alcohol use: No     Alcohol/week: 0.0 oz  . Drug use: No    FAMILY HISTORY: Family History  Adopted: Yes  Family history unknown: Yes    ROS: Review of Systems  Constitutional: Positive for weight loss.  Endo/Heme/Allergies:       Negative for hypoglycemia  Psychiatric/Behavioral: Positive for depression. Negative for suicidal ideas.       Positive for Stress    PHYSICAL EXAM: Blood pressure 137/75, pulse 73, temperature 98 F (36.7 C), temperature source Oral, height _0  (1.575 m), weight 217 lb (98.4 kg), SpO2 99 %. Body mass index is 39.69 kg/m. Physical Exam  Constitutional: She is oriented to person, place, and time. She appears well-developed and well-nourished.  Cardiovascular: Normal rate.  Pulmonary/Chest: Effort normal.  Musculoskeletal: Normal range of motion.  Neurological: She is oriented to person, place, and time.  Skin: Skin is warm and dry.  Vitals reviewed.   RECENT LABS AND TESTS: BMET    Component Value Date/Time   NA 140 03/07/2018 1310   K 3.7 03/07/2018 1310   CL 97 03/07/2018 1310   CO2 28 03/07/2018 1310   GLUCOSE 111 (H) 03/07/2018 1310   GLUCOSE 58 (L) 09/08/2016 1035   BUN 26 03/07/2018 1310   CREATININE 1.36 (H) 03/07/2018 1310   CREATININE 1.30 (H) 09/08/2016 1035   CALCIUM 9.4 03/07/2018 1310   GFRNONAA 41 (L) 03/07/2018 1310   GFRNONAA 36 (L) 07/10/2014 1354   GFRAA 47 (L) 03/07/2018 1310   GFRAA 41 (L) 07/10/2014 1354   Lab Results  Component Value Date   HGBA1C 8.1 (H) 03/07/2018   HGBA1C 11.0 01/09/2018   HGBA1C 8.0 10/07/2017   HGBA1C 7.0 06/28/2017   HGBA1C 7.3 (H)  03/23/2017   Lab Results  Component Value Date   INSULIN 6.2 03/07/2018   CBC    Component Value Date/Time   WBC 5.1 01/30/2018 1000   WBC 5.7 09/08/2016 1035   RBC 3.38 (L) 01/30/2018 1000   RBC 3.82 09/08/2016 1035   HGB 10.9 (L) 01/30/2018 1000   HCT 30.8 (L) 01/30/2018 1000   PLT 226 01/30/2018 1000   MCV 91 01/30/2018 1000   MCH 32.2 01/30/2018 1000   MCH  29.8 09/08/2016 1035   MCHC 35.4 01/30/2018 1000   MCHC 32.5 09/08/2016 1035   RDW 14.8 01/30/2018 1000   LYMPHSABS 1.7 01/30/2018 1000   MONOABS 513 09/08/2016 1035   EOSABS 0.3 01/30/2018 1000   BASOSABS 0.0 01/30/2018 1000   Iron/TIBC/Ferritin/ %Sat    Component Value Date/Time   IRON 79 02/03/2016 1015   TIBC 309 02/03/2016 1015   IRONPCTSAT 26 02/03/2016 1015   Lipid Panel     Component Value Date/Time   CHOL 168 03/07/2018 1310   TRIG 237 (H) 03/07/2018 1310   HDL 40 03/07/2018 1310   CHOLHDL 5.1 (H) 01/09/2018 1221   CHOLHDL 5.3 (H) 09/08/2016 1035   VLDL 46 (H) 09/08/2016 1035   LDLCALC 81 03/07/2018 1310   Hepatic Function Panel     Component Value Date/Time   PROT 7.4 03/07/2018 1310   ALBUMIN 4.1 03/07/2018 1310   AST 40 03/07/2018 1310   ALT 36 (H) 03/07/2018 1310   ALKPHOS 87 03/07/2018 1310   BILITOT 0.3 03/07/2018 1310      Component Value Date/Time   TSH 0.958 03/07/2018 1310   TSH 0.874 01/30/2018 1000   TSH 1.060 03/23/2017 1230   Results for TAHEERA, THOMANN (MRN [id number]) as of 05/01/2018 15:04  Ref. Range 03/07/2018 13:10  Vitamin D, 25-Hydroxy Latest Ref Range: 30.0 - 100.0 ng/mL 54.8   ASSESSMENT AND PLAN: Type 2 diabetes mellitus without complication, with long-term current use of insulin (HCC)  Other depression - with emotional eating  Class 2 severe obesity with serious comorbidity and body mass index (BMI) of 39.0 to 39.9 in adult, unspecified obesity type ([place])  PLAN:  Diabetes II Jarrah has been given extensive diabetes education by myself today including ideal fasting and post-prandial blood glucose readings, individual ideal Hgb A1c goals and hypoglycemia prevention. We discussed the importance of good blood sugar control to decrease the likelihood of diabetic complications such as nephropathy, neuropathy, limb loss, blindness, coronary artery disease, and death. We discussed the importance of intensive lifestyle modification  including diet, exercise and weight loss as the first line treatment for diabetes. We will follow up with blood sugar logs at the next appointment. Vennela agrees to continue her diabetes medications and will follow up at the agreed upon time.  Depression with Emotional Eating Behaviors We discussed behavior modification techniques today to help Bailee deal with her emotional eating and depression. We will refer to Dr. [name] and Sherin Quarry will follow up with our clinic at the agreed upon time.  We spent > than 50% of the 15 minute visit on the counseling as documented in the note.  Obesity Kaileia is currently in the action stage of change. As such, her goal is to continue with weight loss efforts She has agreed to follow the Category 3 plan and portion control better and make smarter food choices, such as increase vegetables and decrease simple carbohydrates, when traveling.  Tristy has been instructed to work up to a goal of 150 minutes  of combined cardio and strengthening exercise per week for weight loss and overall health benefits. We discussed the following Behavioral Modification Strategies today: planning for success, increasing lean protein intake, increasing vegetables and work on meal planning and easy cooking plans  Sarahi has agreed to follow up with our clinic in 2 weeks. She was informed of the importance of frequent follow up visits to maximize her success with intensive lifestyle modifications for her multiple health conditions.   OBESITY BEHAVIORAL INTERVENTION VISIT  Today's visit was # 4 out of 22.  Starting weight: 224 lbs Starting date: 03/07/18 Today's weight : 217 lbs Today's date: 05/01/2018 Total lbs lost to date: 7 (Patients must lose 7 lbs in the first 6 months to continue with counseling)   ASK: We discussed the diagnosis of obesity with Dazaria S Kinton today and Evette agreed to give [name] permission to discuss obesity behavioral modification therapy  today.  ASSESS: Delecia has the diagnosis of obesity and her BMI today is 39.68 Kolbee is in the action stage of change   ADVISE: Machaela was educated on the multiple health risks of obesity as well as the benefit of weight loss to improve her health. She was advised of the need for long term treatment and the importance of lifestyle modifications.  AGREE: Multiple dietary modification options and treatment options were discussed and  Brooks agreed to the above obesity treatment plan.  I, Doreene Nest, am acting as transcriptionist for Eber Jones, MD  I have reviewed the above documentation for accuracy and completeness, and I agree with the above. - Ilene Qua, MD
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
## 2018-05-20 ENCOUNTER — Other Ambulatory Visit: Payer: Self-pay | Admitting: Family Medicine

## 2018-05-22 DIAGNOSIS — M109 Gout, unspecified: Secondary | ICD-10-CM | POA: Diagnosis not present

## 2018-05-22 DIAGNOSIS — M5136 Other intervertebral disc degeneration, lumbar region: Secondary | ICD-10-CM | POA: Diagnosis not present

## 2018-05-22 DIAGNOSIS — Z79899 Other long term (current) drug therapy: Secondary | ICD-10-CM | POA: Diagnosis not present

## 2018-05-22 DIAGNOSIS — M791 Myalgia, unspecified site: Secondary | ICD-10-CM | POA: Diagnosis not present

## 2018-05-22 DIAGNOSIS — M2550 Pain in unspecified joint: Secondary | ICD-10-CM | POA: Diagnosis not present

## 2018-05-22 DIAGNOSIS — R682 Dry mouth, unspecified: Secondary | ICD-10-CM | POA: Diagnosis not present

## 2018-05-22 DIAGNOSIS — M0609 Rheumatoid arthritis without rheumatoid factor, multiple sites: Secondary | ICD-10-CM | POA: Diagnosis not present

## 2018-05-22 DIAGNOSIS — E669 Obesity, unspecified: Secondary | ICD-10-CM | POA: Diagnosis not present

## 2018-05-22 DIAGNOSIS — Z6839 Body mass index (BMI) 39.0-39.9, adult: Secondary | ICD-10-CM | POA: Diagnosis not present

## 2018-05-23 ENCOUNTER — Ambulatory Visit (INDEPENDENT_AMBULATORY_CARE_PROVIDER_SITE_OTHER): Payer: Medicare Other | Admitting: Family Medicine

## 2018-05-23 ENCOUNTER — Ambulatory Visit (INDEPENDENT_AMBULATORY_CARE_PROVIDER_SITE_OTHER): Payer: Medicare Other | Admitting: Psychology

## 2018-05-23 VITALS — BP 112/61 | HR 100 | Temp 98.7°F | Ht 62.0 in | Wt 216.0 lb

## 2018-05-23 DIAGNOSIS — Z6839 Body mass index (BMI) 39.0-39.9, adult: Secondary | ICD-10-CM | POA: Diagnosis not present

## 2018-05-23 DIAGNOSIS — E559 Vitamin D deficiency, unspecified: Secondary | ICD-10-CM

## 2018-05-23 DIAGNOSIS — E119 Type 2 diabetes mellitus without complications: Secondary | ICD-10-CM | POA: Diagnosis not present

## 2018-05-23 DIAGNOSIS — Z794 Long term (current) use of insulin: Secondary | ICD-10-CM | POA: Diagnosis not present

## 2018-05-23 DIAGNOSIS — F3289 Other specified depressive episodes: Secondary | ICD-10-CM

## 2018-05-23 NOTE — Progress Notes (Signed)
Office: [phone]  /  Fax: [phone]   HPI:   Chief Complaint: OBESITY Angel French is here to discuss her progress with her obesity treatment plan. She is on the Category 3 plan and is following her eating plan approximately 90 % of the time. She states she is walking for 15 minutes 7 times per week. Angel French is looking to do vegetarian meal plan. Noticed increase in joint pain with red meat consumption. Planning to travel with friends to [place] next week.  Her weight is 216 lb (98 kg) today and has had a weight loss of 1 pound over a period of 3 weeks since her last visit. She has lost 8 lbs since starting treatment with [name].  Diabetes II Angel French has a diagnosis of diabetes type II. Angel French states fasting BGs range in <110, post prandial after dinner <130 on insulin. She denies any hypoglycemic episodes. Last A1c was 8.1. She has been working on intensive lifestyle modifications including diet, exercise, and weight loss to help control her blood glucose levels.  Vitamin D Deficiency Angel French has a diagnosis of vitamin D deficiency. She is currently taking OTC Vit D She notes fatigue and denies nausea, vomiting or muscle weakness.  ALLERGIES: Allergies  Allergen Reactions  . Codeine Anaphylaxis  . Contrast Media [Iodinated Diagnostic Agents] Anaphylaxis  . Nitrofurantoin Monohyd Macro Anaphylaxis  . Betadine [Povidone Iodine] Itching  . Folic Acid Itching  . Gabapentin Other (See Comments)    Makes patient feel drunk  . Iodine Hives  . Lyrica [Pregabalin] Other (See Comments)    Makes patient feel drunk  . Red Dye Itching  . Ultram [Tramadol Hcl] Nausea And Vomiting    MEDICATIONS: Current Outpatient Medications on File Prior to Visit  Medication Sig Dispense Refill  . acetaminophen (TYLENOL) 325 MG tablet Take 650 mg by mouth every 6 (six) hours as needed (every 6 weeks before RA infusion).    Marland Kitchen allopurinol (ZYLOPRIM) 100 MG tablet Take 1 tablet (100 mg total) by mouth daily.  Ov needed (Patient taking differently: Take 200 mg by mouth daily. ) 90 tablet 3  . aspirin 325 MG tablet Take 325 mg by mouth daily.     . Blood Glucose Monitoring Suppl (BLOOD GLUCOSE METER KIT AND SUPPLIES) KIT Dispense based on patient and insurance preference. Use up to four times daily as directed. (FOR ICD-9 250.00, 250.01). 1 each 11  . Cholecalciferol (VITAMIN D3) 5000 units CAPS Take 1 capsule by mouth daily.    . diclofenac sodium (VOLTAREN) 1 % GEL Apply 2 g topically 4 (four) times daily. (Patient taking differently: Apply 2 g topically 2 (two) times daily as needed (pain). ) 100 g 3  . diphenhydrAMINE (BENADRYL) 25 MG tablet Take 25 mg by mouth every 6 (six) hours as needed (every 6 weeks prior to RA infusion).    . DULoxetine (CYMBALTA) 30 MG capsule TAKE 1 CAPSULE BY MOUTH EVERY DAY 90 capsule 3  . escitalopram (LEXAPRO) 20 MG tablet TAKE 1 TABLET BY MOUTH EVERY DAY 90 tablet 1  . furosemide (LASIX) 20 MG tablet Take 1-3 tablets (20-60 mg total) by mouth daily. 200 tablet 1  . glucose blood test strip Check sugar three times daily  Dx: DMII insulin dependent with retinopathy, neuropathy controlled 300 each 3  . HYDROcodone-acetaminophen (NORCO) 10-325 MG tablet Take 1 tablet by mouth every 12 (twelve) hours as needed. 60 tablet 0  . insulin aspart (NOVOLOG) 100 UNIT/ML injection Inject 25 Units into the skin 3 (  three) times daily with meals. 10 mL 11  . Insulin Syringes, Disposable, U-100 0.5 ML MISC 28 Units by Does not apply route 2 (two) times daily. 100 each 11  . leflunomide (ARAVA) 20 MG tablet Take 20 mg by mouth daily.     Marland Kitchen LEVEMIR 100 UNIT/ML injection USE 60 UNITS AS DIRECTED AT BEDTIME 20 mL 1  . Needles & Syringes MISC 1 Syringe by Does not apply route 2 (two) times daily. 100 each 11  . omeprazole (PRILOSEC) 20 MG capsule TAKE 1 CAPSULE BY MOUTH EVERY DAY 90 capsule 3  . oxybutynin (DITROPAN XL) 15 MG 24 hr tablet TAKE 1 TABLET BY MOUTH AT BEDTIME 90 tablet 3  .  predniSONE (DELTASONE) 5 MG tablet Take 5 mg by mouth daily with breakfast. Only takes with RA Flare.    . rosuvastatin (CRESTOR) 10 MG tablet Take 1 tablet (10 mg total) by mouth daily. 90 tablet 1  . traZODone (DESYREL) 100 MG tablet TAKE 2 TABLETS BY MOUTH EVERY DAY AT BEDTIME 180 tablet 1  . ULTICARE INSULIN SYRINGE 31G X 5/16" 0.5 ML MISC USE AS DIRECTED TO INJECT INSULIN 2 TIMES DAILY 100 each 4   No current facility-administered medications on file prior to visit.     PAST MEDICAL HISTORY: Past Medical History:  Diagnosis Date  . Allergy    generic allergy pill; Spring and Fall only.  . Anxiety   . Arthritis    DDD lumbar, R hip OA.  s/p ortho consult in past.  . Blood transfusion without reported diagnosis    Mountain climbing accident in [place].  . Brachial plexus disorders   . Cataract    B retractions.  . Chronic kidney disease    stage 3 per pt.   . Chronic pain syndrome   . Chronic renal insufficiency, stage 3 (moderate) (HCC)   . Constipation   . DDD (degenerative disc disease), lumbar   . Depression   . Diabetes mellitus   . Diabetic peripheral neuropathy associated with type 2 diabetes mellitus ([place])   . Diabetic retinopathy ([place])   . Diabetic retinopathy associated with type 2 diabetes mellitus ([place])    s/p laser treatment multiple.  Unable to drive.  . Fatty liver   . Fibromyalgia   . Food allergy   . GERD (gastroesophageal reflux disease)   . Hypercholesteremia   . Hyperlipidemia   . Hypertension    controlled, off meds   . IBS (irritable bowel syndrome)   . Leg edema   . Neuromuscular disorder ([place])   . OSA (obstructive sleep apnea)   . Osteoarthritis   . Rheumatic fever   . Rheumatoid arthritis ([place])   . Stomach ulcer   . Swallowing difficulty   . TIA (transient ischemic attack)   . Ulcer    Peptic ulcer H. Pylori + s/p treatment.  Upper GI diagnosed.[name] Prilosec PRN .    PAST SURGICAL HISTORY: Past Surgical History:  Procedure  Laterality Date  .  2 SPINAL INJECTIONS     . ABDOMINAL HYSTERECTOMY  11/02/1979   DUB; cervical dysplasia; ovaries intact.  . ABDOMINAL SURGERY     staph abcess   . [facility] Admission     age 72; three months in [place].  Marland Kitchen BREAST BIOPSY    . CARDIAC CATHETERIZATION  11/02/2007   normal coronary arteries.  . CARPAL TUNNEL RELEASE     Bilateral.  . CATARACT EXTRACTION, BILATERAL    . CHOLECYSTECTOMY    .  ESOPHAGEAL MANOMETRY N/A 09/14/2017   Procedure: ESOPHAGEAL MANOMETRY (EM);  Surgeon: Ronnette Juniper, MD;  Location: [facility];  Service: Gastroenterology;  Laterality: N/A;  . EYE SURGERY     Cataracts B. Laser surgery x 7 for Diabetic Retinopathy  . TONSILLECTOMY      SOCIAL HISTORY: Social History   Tobacco Use  . Smoking status: Former Research scientist (life sciences)  . Smokeless tobacco: Never Used  . Tobacco comment: Quit 1987  Substance Use Topics  . Alcohol use: No    Alcohol/week: 0.0 oz  . Drug use: No    FAMILY HISTORY: Family History  Adopted: Yes  Family history unknown: Yes    ROS: Review of Systems  Constitutional: Positive for malaise/fatigue and weight loss.  Gastrointestinal: Negative for nausea and vomiting.  Musculoskeletal:       Negative muscle weakness  Endo/Heme/Allergies:       Negative hypoglycemia    PHYSICAL EXAM: Blood pressure 112/61, pulse 100, temperature 98.7 F (37.1 C), temperature source Oral, height '5\' 2"'$  (1.575 m), weight 216 lb (98 kg), SpO2 98 %. Body mass index is 39.51 kg/m. Physical Exam  Constitutional: She is oriented to person, place, and time. She appears well-developed and well-nourished.  Cardiovascular: Normal rate.  Pulmonary/Chest: Effort normal.  Musculoskeletal: Normal range of motion.  Neurological: She is oriented to person, place, and time.  Skin: Skin is warm and dry.  Psychiatric: She has a normal mood and affect. Her behavior is normal.  Vitals reviewed.   RECENT LABS AND TESTS: BMET    Component Value  Date/Time   NA 140 03/07/2018 1310   K 3.7 03/07/2018 1310   CL 97 03/07/2018 1310   CO2 28 03/07/2018 1310   GLUCOSE 111 (H) 03/07/2018 1310   GLUCOSE 58 (L) 09/08/2016 1035   BUN 26 03/07/2018 1310   CREATININE 1.36 (H) 03/07/2018 1310   CREATININE 1.30 (H) 09/08/2016 1035   CALCIUM 9.4 03/07/2018 1310   GFRNONAA 41 (L) 03/07/2018 1310   GFRNONAA 36 (L) 07/10/2014 1354   GFRAA 47 (L) 03/07/2018 1310   GFRAA 41 (L) 07/10/2014 1354   Lab Results  Component Value Date   HGBA1C 8.1 (H) 03/07/2018   HGBA1C 11.0 01/09/2018   HGBA1C 8.0 10/07/2017   HGBA1C 7.0 06/28/2017   HGBA1C 7.3 (H) 03/23/2017   Lab Results  Component Value Date   INSULIN 6.2 03/07/2018   CBC    Component Value Date/Time   WBC 5.1 01/30/2018 1000   WBC 5.7 09/08/2016 1035   RBC 3.38 (L) 01/30/2018 1000   RBC 3.82 09/08/2016 1035   HGB 10.9 (L) 01/30/2018 1000   HCT 30.8 (L) 01/30/2018 1000   PLT 226 01/30/2018 1000   MCV 91 01/30/2018 1000   MCH 32.2 01/30/2018 1000   MCH 29.8 09/08/2016 1035   MCHC 35.4 01/30/2018 1000   MCHC 32.5 09/08/2016 1035   RDW 14.8 01/30/2018 1000   LYMPHSABS 1.7 01/30/2018 1000   MONOABS 513 09/08/2016 1035   EOSABS 0.3 01/30/2018 1000   BASOSABS 0.0 01/30/2018 1000   Iron/TIBC/Ferritin/ %Sat    Component Value Date/Time   IRON 79 02/03/2016 1015   TIBC 309 02/03/2016 1015   IRONPCTSAT 26 02/03/2016 1015   Lipid Panel     Component Value Date/Time   CHOL 168 03/07/2018 1310   TRIG 237 (H) 03/07/2018 1310   HDL 40 03/07/2018 1310   CHOLHDL 5.1 (H) 01/09/2018 1221   CHOLHDL 5.3 (H) 09/08/2016 1035   VLDL 46 (H)  09/08/2016 1035   LDLCALC 81 03/07/2018 1310   Hepatic Function Panel     Component Value Date/Time   PROT 7.4 03/07/2018 1310   ALBUMIN 4.1 03/07/2018 1310   AST 40 03/07/2018 1310   ALT 36 (H) 03/07/2018 1310   ALKPHOS 87 03/07/2018 1310   BILITOT 0.3 03/07/2018 1310      Component Value Date/Time   TSH 0.958 03/07/2018 1310   TSH 0.874  01/30/2018 1000   TSH 1.060 03/23/2017 1230  Results for LAMARA, BRECHT (MRN [id number]) as of 05/23/2018 17:41  Ref. Range 03/07/2018 13:10  Vitamin D, 25-Hydroxy Latest Ref Range: 30.0 - 100.0 ng/mL 54.8    ASSESSMENT AND PLAN: Type 2 diabetes mellitus without complication, with long-term current use of insulin (HCC)  Vitamin D deficiency  Class 2 severe obesity with serious comorbidity and body mass index (BMI) of 39.0 to 39.9 in adult, unspecified obesity type ([place])  PLAN:  Diabetes II Angel French has been given extensive diabetes education by myself today including ideal fasting and post-prandial blood glucose readings, individual ideal Hgb A1c goals and hypoglycemia prevention. We discussed the importance of good blood sugar control to decrease the likelihood of diabetic complications such as nephropathy, neuropathy, limb loss, blindness, coronary artery disease, and death. We discussed the importance of intensive lifestyle modification including diet, exercise and weight loss as the first line treatment for diabetes. Angel French agrees to continue her current diabetes medications and she agrees to follow up with our clinic in 2 weeks.  Vitamin D Deficiency Angel French was informed that low vitamin D levels contributes to fatigue and are associated with obesity, breast, and colon cancer. Angel French agrees to continue taking OTC Vit D 5,000 IU daily and will follow up for routine testing of vitamin D, at least 2-3 times per year. She was informed of the risk of over-replacement of vitamin D and agrees to not increase her dose unless she discusses this with [name] first. Angel French agrees to follow up with our clinic in 2 weeks.  We spent > than 50% of the 15 minute visit on the counseling as documented in the note.  Obesity Angel French is currently in the action stage of change. As such, her goal is to continue with weight loss efforts She has agreed to follow our protein rich vegetarian plan Angel French has been  instructed to work up to a goal of 150 minutes of combined cardio and strengthening exercise per week for weight loss and overall health benefits. We discussed the following Behavioral Modification Strategies today: increasing lean protein intake, increasing vegetables, work on meal planning and easy cooking plans, better snacking choices, and planning for success   Angel French has agreed to follow up with our clinic in 2 weeks. She was informed of the importance of frequent follow up visits to maximize her success with intensive lifestyle modifications for her multiple health conditions.   OBESITY BEHAVIORAL INTERVENTION VISIT  Today's visit was # 5 out of 22.  Starting weight: 224 lbs Starting date: 03/07/18 Today's weight : 216 lbs Today's date: 05/23/2018 Total lbs lost to date: 8    ASK: We discussed the diagnosis of obesity with Skyley S Kinton today and Monicia agreed to give [name] permission to discuss obesity behavioral modification therapy today.  ASSESS: Angel French has the diagnosis of obesity and her BMI today is 39.5 Angel French is in the action stage of change   ADVISE: Angel French was educated on the multiple health risks of obesity as well as the benefit of  weight loss to improve her health. She was advised of the need for long term treatment and the importance of lifestyle modifications.  AGREE: Multiple dietary modification options and treatment options were discussed and  Angel French agreed to the above obesity treatment plan.  I, Trixie Dredge, am acting as transcriptionist for Ilene Qua, MD  I have reviewed the above documentation for accuracy and completeness, and I agree with the above. - Ilene Qua, MD
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
## 2018-05-25 ENCOUNTER — Other Ambulatory Visit: Payer: Self-pay | Admitting: Family Medicine

## 2018-05-25 ENCOUNTER — Telehealth: Payer: Self-pay | Admitting: Family Medicine

## 2018-05-25 DIAGNOSIS — M0609 Rheumatoid arthritis without rheumatoid factor, multiple sites: Secondary | ICD-10-CM | POA: Diagnosis not present

## 2018-05-25 DIAGNOSIS — Z79899 Other long term (current) drug therapy: Secondary | ICD-10-CM | POA: Diagnosis not present

## 2018-05-25 NOTE — Telephone Encounter (Signed)
Copied from [place] [phone]. Topic: General - Other >> May 25, 2018  2:31 PM Lennox Solders wrote: Reason for KMM:NOTRRNHA [facility] and [facility] would like early refill on hydrocodone. Pt is going out of town. Last refill on 05-01-18

## 2018-05-26 NOTE — Telephone Encounter (Signed)
Please advise refill? 

## 2018-05-30 ENCOUNTER — Other Ambulatory Visit: Payer: Self-pay | Admitting: Family Medicine

## 2018-05-30 NOTE — Telephone Encounter (Signed)
Left message on [facility] voicemail giving verbal OK to fill hydrocodone early.

## 2018-06-01 MED ORDER — TRAZODONE HCL 100 MG PO TABS: ORAL_TABLET | ORAL | 1 refills | Status: DC

## 2018-06-05 ENCOUNTER — Other Ambulatory Visit: Payer: Self-pay | Admitting: Family Medicine

## 2018-06-05 NOTE — Telephone Encounter (Signed)
Lasix 20 mg refill request  LOV 04/12/18 Dr. Tamala Julian  LR:  04/12/18  #[id number]   Refills:  [id number] [place] [place]

## 2018-06-06 ENCOUNTER — Encounter (INDEPENDENT_AMBULATORY_CARE_PROVIDER_SITE_OTHER): Payer: Self-pay

## 2018-06-06 ENCOUNTER — Ambulatory Visit (INDEPENDENT_AMBULATORY_CARE_PROVIDER_SITE_OTHER): Payer: Medicare Other | Admitting: Psychology

## 2018-06-06 ENCOUNTER — Ambulatory Visit (INDEPENDENT_AMBULATORY_CARE_PROVIDER_SITE_OTHER): Payer: Self-pay | Admitting: Family Medicine

## 2018-06-06 NOTE — Progress Notes (Addendum)
Office: [phone]  /  Fax: [phone]   Date: June 20, 2018 Time Seen: 10:55am Duration: 30 minutes Provider: Glennie Isle, Psy.D. Type of Session: Individual Therapy   HPI: Emony was referred by Dr. Ilene Qua due to Depression with emotional eating behaviors. Per the note for a visit with Dr. Adair Patter on May 01, 2018, "Randiehad lots of emotional eating after the roof collapse. She has struggled with emotional eating for years. She struggleswith emotional eating and using food for comfort to the extent that it is negatively impacting herhealth. Sheoften snacks when sheis not hungry. Randiesometimes feels sheis out of control and then feels guilty that shemade poor food choices. [name] been working on behavior modification techniques to help reduce heremotional eating and has been somewhat successful.Sheshows no sign of suicidal or homicidal ideations."  Per the note for the initial visit with this provider on May 23, 2018, Farhiya reported "My entire life as an adult, I have taken care of family members." She further added, "My son is a sociopath" and resides in [place] in jail. Krystalyn stated her daughter and granddaughter reside with her, and "There is always stress." She described the stress as "permanent" and added, "I see no way around it." Alexa elaborated she plays a vital role in her granddaughter's upbringing and her granddaughters suffers from "severe seizures." Maeryn stated she uses food for comfort when experiencing stress; however, she described increased awareness of her eating choices. Ritaj was asked to complete a questionnaire assessing various behaviors related to emotional eating. Jonica endorsed the following: overeat when you are celebrating; eat certain foods when you are anxious, stressed, depressed, or your feelings are hurt; use food to help you cope with emotional situations; find food is comforting to you; and overeat when you are worried  about something. Moreover, per the note for the initial visit with Dr. Adair Patter on Mar 07, 2018, Randiehas been heavy most of herlife and she started gaining weight over the last 12 yrs or so. Herheaviest weight ever was 335pounds. During the initial visit with this provider, Nahima reported "major stresses" starting in childhood, as her brother was "moderately retarded" and "violent." After her parents passed away, Itha shared she was his guardian, but later asked the Court to take away the guardianship.   Session Content: Session focused on the following treatment goal: decrease emotional eating. The session was initiated with the administration of the PHQ-9 and GAD-7, as well as a brief check-in. Amarri shared, "I broke my tail bone." She shared a history of frequent falls, and noted her primary care physician is aware of her history. Charnice indicated she has an appointment with her primary care physician this Friday and the plan is to pursue neurological testing. Since her injury, Tresea reported utilization of the handout from the last appointment and noted an increase in emotional eating as she is "craving things" since her injury. She noted, "I don't always give in though." She clarified she craves "things from childhood," which include, specific dishes her mother cooked. Raneen acknowledged she can cook the food herself, but she believes she is craving specific foods for comfort as her mother would make them when Jaysha was ill. Psychoeducation regarding triggers for emotional eating was provided. Nikko was provided a handout, and encouraged to utilize the handout between now and the next appointment to increase awareness of triggers and frequency. Marily agreed.   Jeanelle was receptive to today's session as evidenced by her sharing about recent events, utilization of the previous handout  given, and willingness to explore her triggers for emotional eating. In addition, Ranie requested additional  copies of handouts to keep in different locations of her home.   Mental Status Examination: Amely arrived early for the appointment; therefore, the session was initiated early. She presented as appropriately dressed and groomed. Priscillia appeared her stated age and demonstrated adequate orientation to time, place, person, and purpose of the appointment. She also demonstrated appropriate eye contact. No psychomotor abnormalities or behavioral peculiarities noted. Her mood was euthymic with congruent affect; however, she indicated she was in pain due to her tail bone injury. Her thought processes were logical, linear, and goal-directed. No hallucinations, delusions, bizarre thinking or behavior reported or observed. Judgment, insight, and impulse control appeared to be grossly intact. There was no evidence of paraphasias (i.e., errors in speech, gross mispronunciations, and word substitutions), repetition deficits, or disturbances in volume or prosody (i.e., rhythm and intonation). There was no evidence of attention or memory impairments. Alleyah denied current suicidal and homicidal ideation, intent or plan.  Structured Assessment Results: The Patient Health Questionnaire-9 (PHQ-9) is a self-report measure that assesses symptoms and severity of depression over the course of the last two weeks. Erdine obtained a score of two suggesting minimal depression. Mykaila finds the endorsed symptoms to be very difficult. Of note, Vanesha indicated the endorsed symptoms are secondary to her current injury.  Depression screen PHQ 2/9 06/20/2018  Decreased Interest 1  Down, Depressed, Hopeless 0  PHQ - 2 Score 1  Altered sleeping 0  Tired, decreased energy 1  Change in appetite 0  Feeling bad or failure about yourself  0  Trouble concentrating 0  Moving slowly or fidgety/restless 0  Suicidal thoughts 0  PHQ-9 Score 2  Difficult doing work/chores -  Some recent data might be hidden   The Generalized Anxiety  Disorder-7 (GAD-7) is a brief self-report measure that assesses symptoms of anxiety over the course of the last two weeks. Kyara obtained a score of two suggesting minimal anxiety. GAD 7 : Generalized Anxiety Score 06/20/2018  Nervous, Anxious, on Edge 0  Control/stop worrying 0  Worry too much - different things 0  Trouble relaxing 1  Restless 0  Easily annoyed or irritable 1  Afraid - awful might happen 0  Total GAD 7 Score 2  Anxiety Difficulty Somewhat difficult   Interventions: Zipporah was administered the PHQ-9 and GAD-7 for symptom monitoring. Content from the last session was reviewed (I.e., physical versus emotional hunger). Throughout today's session, empathic reflections and validation were provided. Psychoeducation regarding triggers for emotional eating was provided. Keamber was given a handout.   DSM-5 Diagnosis: 311 (F32.8) Other Specified Depressive Disorder, Emotional Eating Behaviors  Plan: Missy continues to appear able and willing to participate as evidenced by engagement in reciprocal conversation, and asking questions for clarification as appropriate. The next appointment will be scheduled in two weeks. The next session will focus on reviewing learned skills, and working towards the established treatment goal.

## 2018-06-11 ENCOUNTER — Other Ambulatory Visit: Payer: Self-pay | Admitting: Family Medicine

## 2018-06-12 NOTE — Telephone Encounter (Signed)
novolog refill Last Refill:start date 02/20/18 end date 04/12/18  # 10 ml 7 Refills Last OV: 04/12/18 PCP: Reginia Forts MD Pharmacy:[facility] [id number]

## 2018-06-13 NOTE — Progress Notes (Signed)
I agree with the assessment and plan as directed by NP .The patient is known to me .   Kandace Elrod, MD  

## 2018-06-20 ENCOUNTER — Ambulatory Visit (INDEPENDENT_AMBULATORY_CARE_PROVIDER_SITE_OTHER): Payer: Medicare Other | Admitting: Psychology

## 2018-06-20 ENCOUNTER — Ambulatory Visit (INDEPENDENT_AMBULATORY_CARE_PROVIDER_SITE_OTHER): Payer: Medicare Other | Admitting: Family Medicine

## 2018-06-20 ENCOUNTER — Encounter (INDEPENDENT_AMBULATORY_CARE_PROVIDER_SITE_OTHER): Payer: Self-pay | Admitting: Family Medicine

## 2018-06-20 VITALS — BP 133/77 | HR 86 | Temp 97.6°F | Ht 62.0 in | Wt 220.0 lb

## 2018-06-20 DIAGNOSIS — Z6841 Body Mass Index (BMI) 40.0 and over, adult: Secondary | ICD-10-CM

## 2018-06-20 DIAGNOSIS — E119 Type 2 diabetes mellitus without complications: Secondary | ICD-10-CM

## 2018-06-20 DIAGNOSIS — F3289 Other specified depressive episodes: Secondary | ICD-10-CM | POA: Diagnosis not present

## 2018-06-20 DIAGNOSIS — I6523 Occlusion and stenosis of bilateral carotid arteries: Secondary | ICD-10-CM | POA: Diagnosis not present

## 2018-06-20 DIAGNOSIS — G8929 Other chronic pain: Secondary | ICD-10-CM | POA: Diagnosis not present

## 2018-06-20 DIAGNOSIS — Z794 Long term (current) use of insulin: Secondary | ICD-10-CM

## 2018-06-21 NOTE — Progress Notes (Signed)
Office: [phone]  /  Fax: [phone]   HPI:   Chief Complaint: OBESITY Angel French is here to discuss her progress with her obesity treatment plan. She is on the protein rich vegetarian plan and is following her eating plan approximately 95 % of the time. She states she is exercising 0 minutes 0 times per week. Angel French reports having significant pain following a tailbone fracture. She has noticed an increase in her blood sugar. Angel French is substituting a homemade soup at lunch for her lunch option on the vegetarian plan. Her weight is 220 lb (99.8 kg) today and has not lost weight since her last visit. She has lost 4 lbs since starting treatment with [name].  Diabetes II Angel French has a diagnosis of diabetes type II. Angel French does not have a log of her blood sugar and states fasting BGs range in the 200's. Angel French has an increase in pain. She denies any hypoglycemic episodes. Last A1c was at 8.1 She has been working on intensive lifestyle modifications including diet, exercise, and weight loss to help control her blood glucose levels.  Chronic Pain Angel French has an increase in pain with tailbone fracture. She sees a pain management specialist.  ALLERGIES: Allergies  Allergen Reactions  . Codeine Anaphylaxis  . Contrast Media [Iodinated Diagnostic Agents] Anaphylaxis  . Nitrofurantoin Monohyd Macro Anaphylaxis  . Betadine [Povidone Iodine] Itching  . Folic Acid Itching  . Gabapentin Other (See Comments)    Makes patient feel drunk  . Iodine Hives  . Lyrica [Pregabalin] Other (See Comments)    Makes patient feel drunk  . Red Dye Itching  . Ultram [Tramadol Hcl] Nausea And Vomiting    MEDICATIONS: Current Outpatient Medications on File Prior to Visit  Medication Sig Dispense Refill  . acetaminophen (TYLENOL) 325 MG tablet Take 650 mg by mouth every 6 (six) hours as needed (every 6 weeks before RA infusion).    Marland Kitchen allopurinol (ZYLOPRIM) 100 MG tablet Take 1 tablet (100 mg total) by mouth daily.  Ov needed (Patient taking differently: Take 200 mg by mouth daily. ) 90 tablet 3  . aspirin 325 MG tablet Take 325 mg by mouth daily.     . Blood Glucose Monitoring Suppl (BLOOD GLUCOSE METER KIT AND SUPPLIES) KIT Dispense based on patient and insurance preference. Use up to four times daily as directed. (FOR ICD-9 250.00, 250.01). 1 each 11  . Cholecalciferol (VITAMIN D3) 5000 units CAPS Take 1 capsule by mouth daily.    . diclofenac sodium (VOLTAREN) 1 % GEL Apply 2 g topically 4 (four) times daily. (Patient taking differently: Apply 2 g topically 2 (two) times daily as needed (pain). ) 100 g 3  . diphenhydrAMINE (BENADRYL) 25 MG tablet Take 25 mg by mouth every 6 (six) hours as needed (every 6 weeks prior to RA infusion).    . DULoxetine (CYMBALTA) 30 MG capsule TAKE 1 CAPSULE BY MOUTH EVERY DAY 90 capsule 3  . escitalopram (LEXAPRO) 20 MG tablet TAKE 1 TABLET BY MOUTH EVERY DAY 90 tablet 1  . furosemide (LASIX) 20 MG tablet Take 1-3 tablets (20-60 mg total) by mouth daily. 200 tablet 1  . glucose blood test strip Check sugar three times daily  Dx: DMII insulin dependent with retinopathy, neuropathy controlled 300 each 3  . HYDROcodone-acetaminophen (NORCO) 10-325 MG tablet Take 1 tablet by mouth every 12 (twelve) hours as needed. 60 tablet 0  . insulin aspart (NOVOLOG) 100 UNIT/ML injection Inject 25 Units into the skin 3 (three) times daily  with meals. 10 mL 11  . Insulin Syringes, Disposable, U-100 0.5 ML MISC 28 Units by Does not apply route 2 (two) times daily. 100 each 11  . leflunomide (ARAVA) 20 MG tablet Take 20 mg by mouth daily.     Marland Kitchen LEVEMIR 100 UNIT/ML injection INJECT 60 UNITS AT BEDTIME AS DIRECTED 20 mL 1  . Needles & Syringes MISC 1 Syringe by Does not apply route 2 (two) times daily. 100 each 11  . omeprazole (PRILOSEC) 20 MG capsule TAKE 1 CAPSULE BY MOUTH EVERY DAY 90 capsule 3  . oxybutynin (DITROPAN XL) 15 MG 24 hr tablet TAKE 1 TABLET BY MOUTH AT BEDTIME 90 tablet 3  .  predniSONE (DELTASONE) 5 MG tablet Take 5 mg by mouth daily with breakfast. Only takes with RA Flare.    . rosuvastatin (CRESTOR) 10 MG tablet Take 1 tablet (10 mg total) by mouth daily. 90 tablet 1  . traZODone (DESYREL) 100 MG tablet TAKE 2 TABLETS BY MOUTH EVERY DAY AT BEDTIME 180 tablet 1  . ULTICARE INSULIN SYRINGE 31G X 5/16" 0.5 ML MISC USE AS DIRECTED TO INJECT INSULIN 2 TIMES DAILY 100 each 4   No current facility-administered medications on file prior to visit.     PAST MEDICAL HISTORY: Past Medical History:  Diagnosis Date  . Allergy    generic allergy pill; Spring and Fall only.  . Anxiety   . Arthritis    DDD lumbar, R hip OA.  s/p ortho consult in past.  . Blood transfusion without reported diagnosis    Mountain climbing accident in [place].  . Brachial plexus disorders   . Cataract    B retractions.  . Chronic kidney disease    stage 3 per pt.   . Chronic pain syndrome   . Chronic renal insufficiency, stage 3 (moderate) (HCC)   . Constipation   . DDD (degenerative disc disease), lumbar   . Depression   . Diabetes mellitus   . Diabetic peripheral neuropathy associated with type 2 diabetes mellitus ([place])   . Diabetic retinopathy ([place])   . Diabetic retinopathy associated with type 2 diabetes mellitus ([place])    s/p laser treatment multiple.  Unable to drive.  . Fatty liver   . Fibromyalgia   . Food allergy   . GERD (gastroesophageal reflux disease)   . Hypercholesteremia   . Hyperlipidemia   . Hypertension    controlled, off meds   . IBS (irritable bowel syndrome)   . Leg edema   . Neuromuscular disorder ([place])   . OSA (obstructive sleep apnea)   . Osteoarthritis   . Rheumatic fever   . Rheumatoid arthritis ([place])   . Stomach ulcer   . Swallowing difficulty   . TIA (transient ischemic attack)   . Ulcer    Peptic ulcer H. Pylori + s/p treatment.  Upper GI diagnosed.Angel French Prilosec PRN .    PAST SURGICAL HISTORY: Past Surgical History:  Procedure  Laterality Date  .  2 SPINAL INJECTIONS     . ABDOMINAL HYSTERECTOMY  11/02/1979   DUB; cervical dysplasia; ovaries intact.  . ABDOMINAL SURGERY     staph abcess   . [facility] Admission     age 16; three months in [place].  Marland Kitchen BREAST BIOPSY    . CARDIAC CATHETERIZATION  11/02/2007   normal coronary arteries.  . CARPAL TUNNEL RELEASE     Bilateral.  . CATARACT EXTRACTION, BILATERAL    . CHOLECYSTECTOMY    .  ESOPHAGEAL MANOMETRY N/A 09/14/2017   Procedure: ESOPHAGEAL MANOMETRY (EM);  Surgeon: Ronnette Juniper, MD;  Location: [facility];  Service: Gastroenterology;  Laterality: N/A;  . EYE SURGERY     Cataracts B. Laser surgery x 7 for Diabetic Retinopathy  . TONSILLECTOMY      SOCIAL HISTORY: Social History   Tobacco Use  . Smoking status: Former Research scientist (life sciences)  . Smokeless tobacco: Never Used  . Tobacco comment: Quit 1987  Substance Use Topics  . Alcohol use: No    Alcohol/week: 0.0 standard drinks  . Drug use: No    FAMILY HISTORY: Family History  Adopted: Yes  Family history unknown: Yes    ROS: Review of Systems  Constitutional: Negative for weight loss.  Musculoskeletal:       Positive for coccyx pain  Endo/Heme/Allergies:       Negative for hypoglycemia Positive for hyperglycemia    PHYSICAL EXAM: Blood pressure 133/77, pulse 86, temperature 97.6 F (36.4 C), temperature source Oral, height _0  (1.575 m), weight 220 lb (99.8 kg), SpO2 99 %. Body mass index is 40.24 kg/m. Physical Exam  Constitutional: She is oriented to person, place, and time. She appears well-developed and well-nourished.  Cardiovascular: Normal rate.  Pulmonary/Chest: Effort normal.  Musculoskeletal: Normal range of motion.  Neurological: She is oriented to person, place, and time.  Skin: Skin is warm and dry.  Psychiatric: She has a normal mood and affect. Her behavior is normal.  Vitals reviewed.   RECENT LABS AND TESTS: BMET    Component Value Date/Time   NA 140  03/07/2018 1310   K 3.7 03/07/2018 1310   CL 97 03/07/2018 1310   CO2 28 03/07/2018 1310   GLUCOSE 111 (H) 03/07/2018 1310   GLUCOSE 58 (L) 09/08/2016 1035   BUN 26 03/07/2018 1310   CREATININE 1.36 (H) 03/07/2018 1310   CREATININE 1.30 (H) 09/08/2016 1035   CALCIUM 9.4 03/07/2018 1310   GFRNONAA 41 (L) 03/07/2018 1310   GFRNONAA 36 (L) 07/10/2014 1354   GFRAA 47 (L) 03/07/2018 1310   GFRAA 41 (L) 07/10/2014 1354   Lab Results  Component Value Date   HGBA1C 8.1 (H) 03/07/2018   HGBA1C 11.0 01/09/2018   HGBA1C 8.0 10/07/2017   HGBA1C 7.0 06/28/2017   HGBA1C 7.3 (H) 03/23/2017   Lab Results  Component Value Date   INSULIN 6.2 03/07/2018   CBC    Component Value Date/Time   WBC 5.1 01/30/2018 1000   WBC 5.7 09/08/2016 1035   RBC 3.38 (L) 01/30/2018 1000   RBC 3.82 09/08/2016 1035   HGB 10.9 (L) 01/30/2018 1000   HCT 30.8 (L) 01/30/2018 1000   PLT 226 01/30/2018 1000   MCV 91 01/30/2018 1000   MCH 32.2 01/30/2018 1000   MCH 29.8 09/08/2016 1035   MCHC 35.4 01/30/2018 1000   MCHC 32.5 09/08/2016 1035   RDW 14.8 01/30/2018 1000   LYMPHSABS 1.7 01/30/2018 1000   MONOABS 513 09/08/2016 1035   EOSABS 0.3 01/30/2018 1000   BASOSABS 0.0 01/30/2018 1000   Iron/TIBC/Ferritin/ %Sat    Component Value Date/Time   IRON 79 02/03/2016 1015   TIBC 309 02/03/2016 1015   IRONPCTSAT 26 02/03/2016 1015   Lipid Panel     Component Value Date/Time   CHOL 168 03/07/2018 1310   TRIG 237 (H) 03/07/2018 1310   HDL 40 03/07/2018 1310   CHOLHDL 5.1 (H) 01/09/2018 1221   CHOLHDL 5.3 (H) 09/08/2016 1035   VLDL 46 (H) 09/08/2016 1035  LDLCALC 81 03/07/2018 1310   Hepatic Function Panel     Component Value Date/Time   PROT 7.4 03/07/2018 1310   ALBUMIN 4.1 03/07/2018 1310   AST 40 03/07/2018 1310   ALT 36 (H) 03/07/2018 1310   ALKPHOS 87 03/07/2018 1310   BILITOT 0.3 03/07/2018 1310      Component Value Date/Time   TSH 0.958 03/07/2018 1310   TSH 0.874 01/30/2018 1000    TSH 1.060 03/23/2017 1230   Results for ALIZEH, MADRIL (MRN [id number]) as of 06/21/2018 07:33  Ref. Range 03/07/2018 13:10  Vitamin D, 25-Hydroxy Latest Ref Range: 30.0 - 100.0 ng/mL 54.8   ASSESSMENT AND PLAN: Type 2 diabetes mellitus without complication, with long-term current use of insulin (HCC)  Other chronic pain  Class 3 severe obesity with serious comorbidity and body mass index (BMI) of 40.0 to 44.9 in adult, unspecified obesity type ([place])  PLAN:  Diabetes II Prosperity has been given extensive diabetes education by myself today including ideal fasting and post-prandial blood glucose readings, individual ideal Hgb A1c goals  and hypoglycemia prevention. We discussed the importance of good blood sugar control to decrease the likelihood of diabetic complications such as nephropathy, neuropathy, limb loss, blindness, coronary artery disease, and death. We discussed the importance of intensive lifestyle modification including diet, exercise and weight loss as the first line treatment for diabetes. We will follow up with blood sugar log at the next appointment. Dejanay agrees to continue her diabetes medications and will follow up at the agreed upon time.  Chronic Pain Sofiya is to discuss change in medication with her pain management doctor. She agrees to follow up with our clinic at the agreed upon time.  We spent > than 50% of the 15 minute visit on the counseling as documented in the note.  Obesity Kaysie is currently in the action stage of change. As such, her goal is to continue with weight loss efforts She has agreed to keep a food journal with 400 to 500 calories and 30+ grams of protein at supper daily and follow our protein rich vegetarian plan Marjean has been instructed to work up to a goal of 150 minutes of combined cardio and strengthening exercise per week for weight loss and overall health benefits. We discussed the following Behavioral Modification Strategies today: better  snacking choices, planning for success, keep a strict food journal, increasing vegetables and work on meal planning and easy cooking plans  Charmian has agreed to follow up with our clinic in 2 weeks. She was informed of the importance of frequent follow up visits to maximize her success with intensive lifestyle modifications for her multiple health conditions.   OBESITY BEHAVIORAL INTERVENTION VISIT  Today's visit was # 6   Starting weight: 224 lbs Starting date: 03/07/18 Today's weight : 220 lbs Today's date: 06/20/2018 Total lbs lost to date: 4 At least 15 minutes were spent on discussing the following behavioral intervention visit.   ASK: We discussed the diagnosis of obesity with Izela S Kinton today and Ailana agreed to give [name] permission to discuss obesity behavioral modification therapy today.  ASSESS: Mackie has the diagnosis of obesity and her BMI today is 40.23 Antonetta is in the action stage of change   ADVISE: Louretta was educated on the multiple health risks of obesity as well as the benefit of weight loss to improve her health. She was advised of the need for long term treatment and the importance of lifestyle modifications to improve her current  health and to decrease her risk of future health problems.  AGREE: Multiple dietary modification options and treatment options were discussed and  Valeri agreed to follow the recommendations documented in the above note.  ARRANGE: Katherin was educated on the importance of frequent visits to treat obesity as outlined per CMS and USPSTF guidelines and agreed to schedule her next follow up appointment today.  I, Doreene Nest, am acting as Location manager for Eber Jones  I have reviewed the above documentation for accuracy and completeness, and I agree with the above. - Ilene Qua, MD

## 2018-06-23 DIAGNOSIS — G894 Chronic pain syndrome: Secondary | ICD-10-CM | POA: Diagnosis not present

## 2018-06-23 DIAGNOSIS — M533 Sacrococcygeal disorders, not elsewhere classified: Secondary | ICD-10-CM | POA: Diagnosis not present

## 2018-06-23 DIAGNOSIS — E1121 Type 2 diabetes mellitus with diabetic nephropathy: Secondary | ICD-10-CM | POA: Diagnosis not present

## 2018-06-23 DIAGNOSIS — L989 Disorder of the skin and subcutaneous tissue, unspecified: Secondary | ICD-10-CM | POA: Diagnosis not present

## 2018-06-23 DIAGNOSIS — R0781 Pleurodynia: Secondary | ICD-10-CM | POA: Diagnosis not present

## 2018-06-23 DIAGNOSIS — Z8589 Personal history of malignant neoplasm of other organs and systems: Secondary | ICD-10-CM | POA: Diagnosis not present

## 2018-06-23 DIAGNOSIS — Z794 Long term (current) use of insulin: Secondary | ICD-10-CM | POA: Diagnosis not present

## 2018-06-23 DIAGNOSIS — R300 Dysuria: Secondary | ICD-10-CM | POA: Diagnosis not present

## 2018-06-23 DIAGNOSIS — M5136 Other intervertebral disc degeneration, lumbar region: Secondary | ICD-10-CM | POA: Diagnosis not present

## 2018-06-23 DIAGNOSIS — E7800 Pure hypercholesterolemia, unspecified: Secondary | ICD-10-CM | POA: Diagnosis not present

## 2018-06-28 NOTE — Progress Notes (Addendum)
Office: [phone]  /  Fax: [phone]   Date: July 05, 2018  Time Seen: 9:13am Duration: 34 minutes Provider: Glennie Isle, Psy.D. Type of Session: Individual Therapy   HPI: Randiewas referred by Dr. Ilene Qua due toDepression with emotional eating behaviors. Per the note for a visit with Dr. Adair Patter on May 01, 2018, "Randiehad lots of emotional eating after the roof collapse. She has struggled with emotional eating for years. She struggleswith emotional eating and using food for comfort to the extent that it is negatively impacting herhealth. Sheoften snacks when sheis not hungry. Randiesometimes feels sheis out of control and then feels guilty that shemade poor food choices. 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 Angel French stated her daughter and granddaughter reside with her, and "There is always stress." She described the stress as "permanent" and added, "I see no way around it." Angel French elaborated she plays a vital role in her granddaughter's upbringing and her granddaughters suffers from "severe seizures." Angel French stated she uses food for comfort when experiencing stress; however, she described increased awareness of her eating choices.Randiewas asked to complete a questionnaire assessing various behaviors related to emotional eating. Randieendorsed the following: overeat when you are celebrating; eat certain foods when you are anxious, stressed, depressed, or your feelings are hurt; use food to help you cope with emotional situations; find food is comforting to you; and overeat when you are worried about  something. Moreover, per the note for the initial visit with Dr. Adair Patter on Mar 07, 2018, Randiehas been heavy most of herlifeand shestarted gaining weight over the last 12 yrs or so. Herheaviest weight ever was 335pounds.During the initial visit with this provider, Angel French reported "major stresses" starting in childhood, as her brother was "moderately retarded" and "violent." After her parents passed away, Angel French shared she was his guardian, but later asked the Court to take away the guardianship.During today's appointment, Angel French reported a decrease in emotional eating; however, noted instances of engaging in emotional eating when feeling "pitiful."  Session Content: Session focused on the following treatment goal: decrease emotional eating. The session was initiated with the administration of the PHQ-9 and GAD-7, as well as a brief check-in. Angel French described ongoing pain due to her fractured tail bone. She explained her first outing since the fall was last weekend, which included attending church. Angel French shared her neurologist wanted to "give more time" before initiating testing. However, she was told by her primary care physician she "must use" her walker due to frequent falls. Regarding her meal plan, Angel French reported, "I followed it only 70%" and explained it was because she was not feeling well. She explained Dr. Adair Patter switched her to journaling on [employer] today during their appointment. Session then focused on reviewing triggers for emotional eating. Angel French reported engaging in emotional eating when feeling "pitiful" since the last appointment. Nevertheless, she reported a decrease in overall emotional eating. Angel French also described a decrease in her A1C per her last lab. Brief psychoeducation regarding thought challenging via finding evidence for and against a thought was discussed, as Angel French discussed frequent thoughts of not doing well the past two weeks. This provider also explained the  connection between thoughts, feelings, and behaviors to Angel French. Recent examples were utilized to increase understanding of the connection. The provider then introduced diaphragmatic breathing as part of relaxation training and coping. Its impact on emotional eating was highlighted for Angel French. Angel French was provided with handouts. Session concluded with a brief introduction to mindfulness. Overall, Angel French was receptive to today's session as evidenced by her openness to sharing and responsiveness to feedback. She also demonstrated openness to exploring and verbalizing thoughts, feelings, behaviors and displayed understanding regarding the need for continued therapeutic services.  Mental Status Examination: Angel French arrived early for the appointment; therefore, the appointment was initiated early. She presented as appropriately dressed and groomed. Angel French appeared her stated age and demonstrated adequate orientation to time, place, person, and purpose of the appointment. She also demonstrated appropriate eye contact. Angel French was observed ambulating with a walker. No behavioral peculiarities noted. Her mood was euthymic with congruent affect. Her thought processes were logical, linear, and goal-directed. No hallucinations, delusions, bizarre thinking or behavior reported or observed. Judgment, insight, and impulse control appeared to be grossly intact. There was no evidence of paraphasias (i.e., errors in speech, gross mispronunciations, and word substitutions), repetition deficits, or disturbances in volume or prosody (i.e., rhythm and intonation). There was no evidence of attention or memory impairments. Angel French denied current suicidal and homicidal ideation, intent or plan.  Structured Assessment Results: The Patient Health Questionnaire-9 (PHQ-9) is a self-report measure that assesses symptoms and severity of depression over the course of the last two weeks. Angel French obtained a score of four suggesting minimal  depression. Angel French finds the endorsed symptoms to be somewhat difficult. Depression screen PHQ 2/9 07/05/2018  Decreased Interest 0  Down, Depressed, Hopeless 1  PHQ - 2 Score 1  Altered sleeping 1  Tired, decreased energy 1  Change in appetite 1  Feeling bad or failure about yourself  0  Trouble concentrating 0  Moving slowly or fidgety/restless 0  Suicidal thoughts 0  PHQ-9 Score 4  Difficult doing work/chores -  Some recent data might be hidden   The Generalized Anxiety Disorder-7 (GAD-7) is a brief self-report measure that assesses symptoms of anxiety over the course of the last two weeks. Angel French obtained a score of zero. GAD 7 : Generalized Anxiety Score 07/05/2018  Nervous, Anxious, on Edge 0  Control/stop worrying 0  Worry too much - different things 0  Trouble relaxing 0  Restless 0  Easily annoyed or irritable 0  Afraid - awful might happen 0  Total GAD 7 Score 0  Anxiety Difficulty Not difficult at all   Interventions: Angel French was administered the PHQ-9 and GAD-7 for symptom monitoring. Content from the last session was reviewed. Throughout today's session, empathic reflections and validation were provided. Psychoeducation regarding diaphragmatic breathing; the connection between thoughts, feelings, and behaviors; and mindfulness was provided.   DSM-5 Diagnosis: 311 (F32.8) Other Specified Depressive Disorder, Emotional Eating Behaviors  Treatment Goal & Progress: During the initial appointment with this provider on May 23, 2018 the goal of decreasing emotional eating was established. Angel French has shown progress in her goal of decreasing emotional eating as evidenced by awareness of occurrences of emotional hunger and identification of triggers for emotional eating. During today's appointment she also verbalized a reduction in emotional eating episodes.   Plan: Angel French continues to appear able and willing to participate as evidenced by engagement in reciprocal conversation, and  asking questions for clarification as appropriate. The next appointment will be scheduled in two weeks. The next session will  focus on reviewing diaphragmatic breathing, engaging in mindfulness, and working towards the established treatment goal.

## 2018-07-05 ENCOUNTER — Ambulatory Visit (INDEPENDENT_AMBULATORY_CARE_PROVIDER_SITE_OTHER): Payer: Medicare Other | Admitting: Family Medicine

## 2018-07-05 ENCOUNTER — Ambulatory Visit (INDEPENDENT_AMBULATORY_CARE_PROVIDER_SITE_OTHER): Payer: Medicare Other | Admitting: Psychology

## 2018-07-05 VITALS — BP 128/76 | HR 86 | Temp 98.5°F | Ht 62.0 in | Wt 220.0 lb

## 2018-07-05 DIAGNOSIS — F3289 Other specified depressive episodes: Secondary | ICD-10-CM | POA: Diagnosis not present

## 2018-07-05 DIAGNOSIS — Z794 Long term (current) use of insulin: Secondary | ICD-10-CM

## 2018-07-05 DIAGNOSIS — Z6841 Body Mass Index (BMI) 40.0 and over, adult: Secondary | ICD-10-CM | POA: Diagnosis not present

## 2018-07-05 DIAGNOSIS — I6523 Occlusion and stenosis of bilateral carotid arteries: Secondary | ICD-10-CM

## 2018-07-05 DIAGNOSIS — E119 Type 2 diabetes mellitus without complications: Secondary | ICD-10-CM | POA: Diagnosis not present

## 2018-07-05 DIAGNOSIS — K219 Gastro-esophageal reflux disease without esophagitis: Secondary | ICD-10-CM

## 2018-07-05 NOTE — Progress Notes (Signed)
Office: [phone]  /  Fax: [phone]   HPI:   Chief Complaint: OBESITY Angel French is here to discuss her progress with her obesity treatment plan. She is on the keep a food journal with 400-500 calories and 30+ grams of protein at supper daily and follow our protein rich vegetarian plan and is following her eating plan approximately 70 % of the time. She states she is exercising 0 minutes 0 times per week. Angel French struggled to follow meal plan secondary to broken tailbone and lack of motivation, and disliking quantity of cheese in vegetarian plan.  Her weight is 220 lb (99.8 kg) today and has not lost weight since her last visit. She has lost 4 lbs since starting treatment with [name].  Diabetes II Angel French has a diagnosis of diabetes type II. Angel French states fasting BGs range between 80 and 127, and she has increased insulin to 100 units of Levemir qhs. She denies any hypoglycemic episodes. Hgb A1c is now 6.9. She has been working on intensive lifestyle modifications including diet, exercise, and weight loss to help control her blood glucose levels.  GERD Angel French has a diagnosis of GERD. She is taking Prilosec and denies any symptoms.  ALLERGIES: Allergies  Allergen Reactions  . Codeine Anaphylaxis  . Contrast Media [Iodinated Diagnostic Agents] Anaphylaxis  . Nitrofurantoin Monohyd Macro Anaphylaxis  . Betadine [Povidone Iodine] Itching  . Folic Acid Itching  . Gabapentin Other (See Comments)    Makes patient feel drunk  . Iodine Hives  . Lyrica [Pregabalin] Other (See Comments)    Makes patient feel drunk  . Red Dye Itching  . Ultram [Tramadol Hcl] Nausea And Vomiting    MEDICATIONS: Current Outpatient Medications on File Prior to Visit  Medication Sig Dispense Refill  . acetaminophen (TYLENOL) 325 MG tablet Take 650 mg by mouth every 6 (six) hours as needed (every 6 weeks before RA infusion).    Marland Kitchen allopurinol (ZYLOPRIM) 100 MG tablet Take 1 tablet (100 mg total) by mouth daily.  Ov needed (Patient taking differently: Take 200 mg by mouth daily. ) 90 tablet 3  . aspirin 325 MG tablet Take 325 mg by mouth daily.     . Blood Glucose Monitoring Suppl (BLOOD GLUCOSE METER KIT AND SUPPLIES) KIT Dispense based on patient and insurance preference. Use up to four times daily as directed. (FOR ICD-9 250.00, 250.01). 1 each 11  . Cholecalciferol (VITAMIN D3) 5000 units CAPS Take 1 capsule by mouth daily.    . diclofenac sodium (VOLTAREN) 1 % GEL Apply 2 g topically 4 (four) times daily. (Patient taking differently: Apply 2 g topically 2 (two) times daily as needed (pain). ) 100 g 3  . diphenhydrAMINE (BENADRYL) 25 MG tablet Take 25 mg by mouth every 6 (six) hours as needed (every 6 weeks prior to RA infusion).    . DULoxetine (CYMBALTA) 30 MG capsule TAKE 1 CAPSULE BY MOUTH EVERY DAY 90 capsule 3  . escitalopram (LEXAPRO) 20 MG tablet TAKE 1 TABLET BY MOUTH EVERY DAY 90 tablet 1  . furosemide (LASIX) 20 MG tablet Take 1-3 tablets (20-60 mg total) by mouth daily. 200 tablet 1  . glucose blood test strip Check sugar three times daily  Dx: DMII insulin dependent with retinopathy, neuropathy controlled 300 each 3  . HYDROcodone-acetaminophen (NORCO) 10-325 MG tablet Take 1 tablet by mouth every 12 (twelve) hours as needed. 60 tablet 0  . insulin aspart (NOVOLOG) 100 UNIT/ML injection Inject 25 Units into the skin 3 (three) times  daily with meals. 10 mL 11  . Insulin Syringes, Disposable, U-100 0.5 ML MISC 28 Units by Does not apply route 2 (two) times daily. 100 each 11  . leflunomide (ARAVA) 20 MG tablet Take 20 mg by mouth daily.     Marland Kitchen LEVEMIR 100 UNIT/ML injection INJECT 60 UNITS AT BEDTIME AS DIRECTED 20 mL 1  . Needles & Syringes MISC 1 Syringe by Does not apply route 2 (two) times daily. 100 each 11  . omeprazole (PRILOSEC) 20 MG capsule TAKE 1 CAPSULE BY MOUTH EVERY DAY 90 capsule 3  . oxybutynin (DITROPAN XL) 15 MG 24 hr tablet TAKE 1 TABLET BY MOUTH AT BEDTIME 90 tablet 3  .  predniSONE (DELTASONE) 5 MG tablet Take 5 mg by mouth daily with breakfast. Only takes with RA Flare.    . rosuvastatin (CRESTOR) 10 MG tablet Take 1 tablet (10 mg total) by mouth daily. 90 tablet 1  . traZODone (DESYREL) 100 MG tablet TAKE 2 TABLETS BY MOUTH EVERY DAY AT BEDTIME 180 tablet 1  . ULTICARE INSULIN SYRINGE 31G X 5/16" 0.5 ML MISC USE AS DIRECTED TO INJECT INSULIN 2 TIMES DAILY 100 each 4   No current facility-administered medications on file prior to visit.     PAST MEDICAL HISTORY: Past Medical History:  Diagnosis Date  . Allergy    generic allergy pill; Spring and Fall only.  . Anxiety   . Arthritis    DDD lumbar, R hip OA.  s/p ortho consult in past.  . Blood transfusion without reported diagnosis    Mountain climbing accident in [place].  . Brachial plexus disorders   . Cataract    B retractions.  . Chronic kidney disease    stage 3 per pt.   . Chronic pain syndrome   . Chronic renal insufficiency, stage 3 (moderate) (HCC)   . Constipation   . DDD (degenerative disc disease), lumbar   . Depression   . Diabetes mellitus   . Diabetic peripheral neuropathy associated with type 2 diabetes mellitus ([place])   . Diabetic retinopathy ([place])   . Diabetic retinopathy associated with type 2 diabetes mellitus ([place])    s/p laser treatment multiple.  Unable to drive.  . Fatty liver   . Fibromyalgia   . Food allergy   . GERD (gastroesophageal reflux disease)   . Hypercholesteremia   . Hyperlipidemia   . Hypertension    controlled, off meds   . IBS (irritable bowel syndrome)   . Leg edema   . Neuromuscular disorder ([place])   . OSA (obstructive sleep apnea)   . Osteoarthritis   . Rheumatic fever   . Rheumatoid arthritis ([place])   . Stomach ulcer   . Swallowing difficulty   . TIA (transient ischemic attack)   . Ulcer    Peptic ulcer H. Pylori + s/p treatment.  Upper GI diagnosed.[name] Prilosec PRN .    PAST SURGICAL HISTORY: Past Surgical History:  Procedure  Laterality Date  .  2 SPINAL INJECTIONS     . ABDOMINAL HYSTERECTOMY  11/02/1979   DUB; cervical dysplasia; ovaries intact.  . ABDOMINAL SURGERY     staph abcess   . [facility] Admission     age 22; three months in [place].  Marland Kitchen BREAST BIOPSY    . CARDIAC CATHETERIZATION  11/02/2007   normal coronary arteries.  . CARPAL TUNNEL RELEASE     Bilateral.  . CATARACT EXTRACTION, BILATERAL    . CHOLECYSTECTOMY    .  ESOPHAGEAL MANOMETRY N/A 09/14/2017   Procedure: ESOPHAGEAL MANOMETRY (EM);  Surgeon: Ronnette Juniper, MD;  Location: [facility];  Service: Gastroenterology;  Laterality: N/A;  . EYE SURGERY     Cataracts B. Laser surgery x 7 for Diabetic Retinopathy  . TONSILLECTOMY      SOCIAL HISTORY: Social History   Tobacco Use  . Smoking status: Former Research scientist (life sciences)  . Smokeless tobacco: Never Used  . Tobacco comment: Quit 1987  Substance Use Topics  . Alcohol use: No    Alcohol/week: 0.0 standard drinks  . Drug use: No    FAMILY HISTORY: Family History  Adopted: Yes  Family history unknown: Yes    ROS: Review of Systems  Constitutional: Negative for weight loss.  Endo/Heme/Allergies:       Negative hypoglycemia    PHYSICAL EXAM: Blood pressure 128/76, pulse 86, temperature 98.5 F (36.9 C), temperature source Oral, height _0  (1.575 m), weight 220 lb (99.8 kg), SpO2 97 %. Body mass index is 40.24 kg/m. Physical Exam  Constitutional: She is oriented to person, place, and time. She appears well-developed and well-nourished.  Cardiovascular: Normal rate.  Pulmonary/Chest: Effort normal.  Musculoskeletal: Normal range of motion.  Neurological: She is oriented to person, place, and time.  Skin: Skin is warm and dry.  Psychiatric: She has a normal mood and affect. Her behavior is normal.  Vitals reviewed.   RECENT LABS AND TESTS: BMET    Component Value Date/Time   NA 140 03/07/2018 1310   K 3.7 03/07/2018 1310   CL 97 03/07/2018 1310   CO2 28 03/07/2018 1310     GLUCOSE 111 (H) 03/07/2018 1310   GLUCOSE 58 (L) 09/08/2016 1035   BUN 26 03/07/2018 1310   CREATININE 1.36 (H) 03/07/2018 1310   CREATININE 1.30 (H) 09/08/2016 1035   CALCIUM 9.4 03/07/2018 1310   GFRNONAA 41 (L) 03/07/2018 1310   GFRNONAA 36 (L) 07/10/2014 1354   GFRAA 47 (L) 03/07/2018 1310   GFRAA 41 (L) 07/10/2014 1354   Lab Results  Component Value Date   HGBA1C 8.1 (H) 03/07/2018   HGBA1C 11.0 01/09/2018   HGBA1C 8.0 10/07/2017   HGBA1C 7.0 06/28/2017   HGBA1C 7.3 (H) 03/23/2017   Lab Results  Component Value Date   INSULIN 6.2 03/07/2018   CBC    Component Value Date/Time   WBC 5.1 01/30/2018 1000   WBC 5.7 09/08/2016 1035   RBC 3.38 (L) 01/30/2018 1000   RBC 3.82 09/08/2016 1035   HGB 10.9 (L) 01/30/2018 1000   HCT 30.8 (L) 01/30/2018 1000   PLT 226 01/30/2018 1000   MCV 91 01/30/2018 1000   MCH 32.2 01/30/2018 1000   MCH 29.8 09/08/2016 1035   MCHC 35.4 01/30/2018 1000   MCHC 32.5 09/08/2016 1035   RDW 14.8 01/30/2018 1000   LYMPHSABS 1.7 01/30/2018 1000   MONOABS 513 09/08/2016 1035   EOSABS 0.3 01/30/2018 1000   BASOSABS 0.0 01/30/2018 1000   Iron/TIBC/Ferritin/ %Sat    Component Value Date/Time   IRON 79 02/03/2016 1015   TIBC 309 02/03/2016 1015   IRONPCTSAT 26 02/03/2016 1015   Lipid Panel     Component Value Date/Time   CHOL 168 03/07/2018 1310   TRIG 237 (H) 03/07/2018 1310   HDL 40 03/07/2018 1310   CHOLHDL 5.1 (H) 01/09/2018 1221   CHOLHDL 5.3 (H) 09/08/2016 1035   VLDL 46 (H) 09/08/2016 1035   LDLCALC 81 03/07/2018 1310   Hepatic Function Panel     Component  Value Date/Time   PROT 7.4 03/07/2018 1310   ALBUMIN 4.1 03/07/2018 1310   AST 40 03/07/2018 1310   ALT 36 (H) 03/07/2018 1310   ALKPHOS 87 03/07/2018 1310   BILITOT 0.3 03/07/2018 1310      Component Value Date/Time   TSH 0.958 03/07/2018 1310   TSH 0.874 01/30/2018 1000   TSH 1.060 03/23/2017 1230    ASSESSMENT AND PLAN: Type 2 diabetes mellitus without  complication, with long-term current use of insulin (HCC)  Gastroesophageal reflux disease, esophagitis presence not specified  Class 3 severe obesity with serious comorbidity and body mass index (BMI) of 40.0 to 44.9 in adult, unspecified obesity type ([place])  PLAN:  Diabetes II Angel French has been given extensive diabetes education by myself today including ideal fasting and post-prandial blood glucose readings, individual ideal Hgb A1c goals and hypoglycemia prevention. We discussed the importance of good blood sugar control to decrease the likelihood of diabetic complications such as nephropathy, neuropathy, limb loss, blindness, coronary artery disease, and death. We discussed the importance of intensive lifestyle modification including diet, exercise and weight loss as the first line treatment for diabetes. Angel French agrees to continue her insulin and she is to bring BGs log at next appointment. Angel French agrees to follow up with our clinic in 2 weeks.  GERD Angel French agrees to continue taking Prilosec, no refill needed, and she agrees to follow up with our clinic in 2 weeks.  I spent > than 50% of the 15 minute visit on counseling as documented in the note.  Obesity Angel French is currently in the action stage of change. As such, her goal is to continue with weight loss efforts She has agreed to keep a food journal with 1200-1300 calories and 85+ grams of protein daily Angel French has been instructed to work up to a goal of 150 minutes of combined cardio and strengthening exercise per week for weight loss and overall health benefits. We discussed the following Behavioral Modification Strategies today: decreasing simple carbohydrates, increasing vegetables, work on meal planning and easy cooking plans, planning for success, and keep a strict food journal   Angel French has agreed to follow up with our clinic in 2 weeks. She was informed of the importance of frequent follow up visits to maximize her success with  intensive lifestyle modifications for her multiple health conditions.   OBESITY BEHAVIORAL INTERVENTION VISIT  Today's visit was # 7   Starting weight: 224 lbs Starting date: 03/07/18 Today's weight : 220 lbs Today's date: 07/05/2018 Total lbs lost to date: 4    ASK: We discussed the diagnosis of obesity with Angel French today and Angel French agreed to give [name] permission to discuss obesity behavioral modification therapy today.  ASSESS: Angel French has the diagnosis of obesity and her BMI today is 40.23 Angel French is in the action stage of change   ADVISE: Angel French was educated on the multiple health risks of obesity as well as the benefit of weight loss to improve her health. She was advised of the need for long term treatment and the importance of lifestyle modifications to improve her current health and to decrease her risk of future health problems.  AGREE: Multiple dietary modification options and treatment options were discussed and  Angel French agreed to follow the recommendations documented in the above note.  ARRANGE: Angel French was educated on the importance of frequent visits to treat obesity as outlined per CMS and USPSTF guidelines and agreed to schedule her next follow up appointment today.  Wilhemena Durie,  am acting as transcriptionist for Ilene Qua, MD  I have reviewed the above documentation for accuracy and completeness, and I agree with the above. - Ilene Qua, MD

## 2018-07-07 DIAGNOSIS — M0609 Rheumatoid arthritis without rheumatoid factor, multiple sites: Secondary | ICD-10-CM | POA: Diagnosis not present

## 2018-07-13 NOTE — Progress Notes (Signed)
Office: [phone]  /  Fax: [phone]   Date: July 19, 2018 Time Seen: 9:55am Duration: 35 minutes Provider: Glennie French, Psy.D. Type of Session: Individual Therapy   HPI: Angel French was referred by Dr. Ilene French due to depression with emotional eating behaviors was seen for an initial appointment with this provider on May 23, 2018. Per the note for a visit with Dr. Adair French on May 01, 2018, "Angel French had lots of emotional eating after the roof collapse. She has struggled with emotional eating for years. She struggles  with emotional eating and using food for comfort to the extent that it is negatively impacting her health. She often snacks when she is not hungry. Angel French sometimes feels she is out of control and then feels guilty that she made poor food choices. She has been working on behavior modification techniques to help reduce her emotional eating and has been somewhat successful. She shows no sign of suicidal or homicidal ideations." During the initial visit with Dr. Adair French, Angel French also discussed that she has been heavy most of her life and she started gaining weight over the last 12 years or so.  Her heaviest weight ever was turned 35 pounds. 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 Angel French stated her daughter and granddaughter reside with her, and "There is always stress." She described the stress as "permanent" and added, "I see no way around it." Angel French elaborated she plays a vital role in her granddaughter's upbringing and her granddaughters suffers from "severe seizures." Angel French stated she uses food for comfort when experiencing stress; however, she described increased awareness of her eating choices. In addition, Angel French reported "major stresses" starting in childhood, as her brother was "moderately retarded" and  "violent." After her parents passed away, Angel French shared she was his guardian, but later asked the Court to take away the guardianship. Angel French was asked to complete a questionnaire assessing various behaviors related to emotional eating. Angel French endorsed the following: overeat when you are celebrating; eat certain foods when you are anxious, stressed, depressed, or your feelings are hurt; use food to help you cope with emotional situations; find food is comforting to you; and overeat when you are worried about something. During today's appointment, Angel French shared a decrease in emotional eating since the onset of treatment.   Session Content: Session focused on the following treatment goal: decrease emotional eating. The session was initiated with the administration of the PHQ-9 and GAD-7, as well as a brief check-in. Information concerning the practice, financial arrangements, and confidentiality and patients' rights were discussed during the initial appointment; however, per this provider's new office policy, Angel French was asked to sign a service agreement related to the aforementioned. The agreement was reviewed with, and signed by, Angel French. Angel French shared she has not been using her walker to ambulate; however, she indicated she will be having an MRI completed this coming Friday. Regarding eating, Angel French indicated she has been journaling and she learned she lost eight pounds today. She discussed journaling has allowed her to plan better when it comes to having meals with family. Angel French further shared increased awareness of her eating patterns since the onset of treatment and indicated that has contributed to a decrease in emotional eating. She noted, "I'm doing better."  Remainder of session focused on mindfulness. Further psychoeducation was provided regarding the benefits. Angel French shared since  the last appointment, she has been engaging in the mindfulness exercise involving her senses, which has reportedly helped  increase her awareness of the present moment. The impact of mindfulness on eating was also discussed. Angel French was given a handout displaying the hunger and satisfaction scale. She was encouraged to use it to increase mindfulness when eating. Angel French agreed. Overall, Angel French was receptive to today's session as evidenced by her openness to sharing, responsiveness to feedback, and willingness to continue practicing mindfulness. In addition, session concluded with Angel French stating, "I've gotten a lot of this [referring to therapeutic services]."   Mental Status Examination: Angel French arrived early for the appointment; therefore, the appointment was initiated early. She presented as appropriately dressed and groomed. Angel French appeared her stated age and demonstrated adequate orientation to time, place, person, and purpose of the appointment. She also demonstrated appropriate eye contact. No psychomotor abnormalities or behavioral peculiarities noted. Her mood was euthymic with congruent affect. Her thought processes were logical, linear, and goal-directed. No hallucinations, delusions, bizarre thinking or behavior reported or observed. Judgment, insight, and impulse control appeared to be grossly intact. There was no evidence of paraphasias (i.e., errors in speech, gross mispronunciations, and word substitutions), repetition deficits, or disturbances in volume or prosody (i.e., rhythm and intonation). There was no evidence of attention or memory impairments. Angel French denied current suicidal and homicidal ideation, intent or plan.  Structured Assessment Results: The Patient Health Questionnaire-9 (PHQ-9) is a self-report measure that assesses symptoms and severity of depression over the course of the last two weeks. Angel French obtained a score of one suggesting minimal depression. Angel French finds the endorsed symptoms to be somewhat difficult. Depression screen [facility] 2/9 07/19/2018  Decreased Interest 0  Down, Depressed, Hopeless 0    PHQ - 2 Score 0  Altered sleeping 0  Tired, decreased energy 1  Change in appetite 0  Feeling bad or failure about yourself  0  Trouble concentrating 0  Moving slowly or fidgety/restless 0  Suicidal thoughts 0  PHQ-9 Score 1  Difficult doing work/chores -  Some recent data might be hidden   The Generalized Anxiety Disorder-7 (GAD-7) is a brief self-report measure that assesses symptoms of anxiety over the course of the last two weeks. Angel French obtained a score of zero. GAD 7 : Generalized Anxiety Score 07/19/2018  Nervous, Anxious, on Edge 0  Control/stop worrying 0  Worry too much - different things 0  Trouble relaxing 0  Restless 0  Easily annoyed or irritable 0  Afraid - awful might happen 0  Total GAD 7 Score 0  Anxiety Difficulty -   Interventions: Angel French was administered the PHQ-9 and GAD-7 for symptom monitoring. Content from the last session was reviewed. Throughout today's session, empathic reflections and validation were provided. Further psychoeducation regarding  mindfulness was provided, specifically as it relates to mindful eating. Angel French was given a handout displaying the hunger and satisfaction scale.   DSM-5 Diagnosis: 311 (F32.8) Other Specified Depressive Disorder, Emotional Eating Behaviors  Treatment Goal & Progress: Angel French was seen for an initial appointment with this provider on May 23, 2018 during which the following treatment goal was established: decrease emotional eating. Kafi has demonstrated progress in her goal of decreasing emotional eating as evidenced by her discussing increased awareness of hunger patterns and triggers for emotional eating. Per self-report, the increased awareness has resulted in a decrease in emotional eating.   Plan: Tyara continues to appear able and willing to participate as evidenced by engagement in reciprocal conversation, and asking questions  for clarification as appropriate. The next appointment will be scheduled in two  weeks. The next session will focus on reviewing reviewing mindfulness and introducing mindfulness. The next appointment will also focus on termination planning.

## 2018-07-19 ENCOUNTER — Ambulatory Visit (INDEPENDENT_AMBULATORY_CARE_PROVIDER_SITE_OTHER): Payer: Medicare Other | Admitting: Psychology

## 2018-07-19 ENCOUNTER — Ambulatory Visit (INDEPENDENT_AMBULATORY_CARE_PROVIDER_SITE_OTHER): Payer: Medicare Other | Admitting: Family Medicine

## 2018-07-19 VITALS — BP 120/71 | HR 90 | Temp 97.9°F | Ht 62.0 in | Wt 212.0 lb

## 2018-07-19 DIAGNOSIS — I6523 Occlusion and stenosis of bilateral carotid arteries: Secondary | ICD-10-CM | POA: Diagnosis not present

## 2018-07-19 DIAGNOSIS — E559 Vitamin D deficiency, unspecified: Secondary | ICD-10-CM | POA: Diagnosis not present

## 2018-07-19 DIAGNOSIS — F3289 Other specified depressive episodes: Secondary | ICD-10-CM | POA: Diagnosis not present

## 2018-07-19 DIAGNOSIS — Z6838 Body mass index (BMI) 38.0-38.9, adult: Secondary | ICD-10-CM

## 2018-07-19 DIAGNOSIS — Z794 Long term (current) use of insulin: Secondary | ICD-10-CM

## 2018-07-19 DIAGNOSIS — E119 Type 2 diabetes mellitus without complications: Secondary | ICD-10-CM | POA: Diagnosis not present

## 2018-07-20 NOTE — Progress Notes (Signed)
Office: [phone]  /  Fax: [phone]   HPI:   Chief Complaint: OBESITY Angel French is here to discuss her progress with her obesity treatment plan. She is on the  keep a food journal with 1200 to 1300 calories and 85+ grams of protein daily and is following her eating plan approximately 98 % of the time. She states she is exercising 0 minutes 0 times per week. Angel French doesn't like journaling, but she isn't doing it daily.  Angel French is staying within her calories and she is getting her protein in daily. Her weight is 212 lb (96.2 kg) today and has had a weight loss of 8 pounds over a period of 2 weeks since her last visit. She has lost 12 lbs since starting treatment with [name].  Diabetes II Angel French has a diagnosis of diabetes type II. Her blood sugars are controlled and she denies any hypoglycemic episodes. Last A1c was at 8.1  She has been working on intensive lifestyle modifications including diet, exercise, and weight loss to help control her blood glucose levels.  Vitamin D deficiency Angel French has a diagnosis of vitamin D deficiency. Angel French is currently taking OTC vit D and she admits to fatigue, but she denies nausea, vomiting or muscle weakness.   ALLERGIES: Allergies  Allergen Reactions  . Codeine Anaphylaxis  . Contrast Media [Iodinated Diagnostic Agents] Anaphylaxis  . Nitrofurantoin Monohyd Macro Anaphylaxis  . Betadine [Povidone Iodine] Itching  . Folic Acid Itching  . Gabapentin Other (See Comments)    Makes patient feel drunk  . Iodine Hives  . Lyrica [Pregabalin] Other (See Comments)    Makes patient feel drunk  . Red Dye Itching  . Ultram [Tramadol Hcl] Nausea And Vomiting    MEDICATIONS: Current Outpatient Medications on File Prior to Visit  Medication Sig Dispense Refill  . acetaminophen (TYLENOL) 325 MG tablet Take 650 mg by mouth every 6 (six) hours as needed (every 6 weeks before RA infusion).    Marland Kitchen allopurinol (ZYLOPRIM) 100 MG tablet Take 1 tablet (100 mg total)  by mouth daily. Ov needed (Patient taking differently: Take 200 mg by mouth daily. ) 90 tablet 3  . aspirin 325 MG tablet Take 325 mg by mouth daily.     . Blood Glucose Monitoring Suppl (BLOOD GLUCOSE METER KIT AND SUPPLIES) KIT Dispense based on patient and insurance preference. Use up to four times daily as directed. (FOR ICD-9 250.00, 250.01). 1 each 11  . Cholecalciferol (VITAMIN D3) 5000 units CAPS Take 1 capsule by mouth daily.    . diclofenac sodium (VOLTAREN) 1 % GEL Apply 2 g topically 4 (four) times daily. (Patient taking differently: Apply 2 g topically 2 (two) times daily as needed (pain). ) 100 g 3  . diphenhydrAMINE (BENADRYL) 25 MG tablet Take 25 mg by mouth every 6 (six) hours as needed (every 6 weeks prior to RA infusion).    . DULoxetine (CYMBALTA) 30 MG capsule TAKE 1 CAPSULE BY MOUTH EVERY DAY 90 capsule 3  . escitalopram (LEXAPRO) 20 MG tablet TAKE 1 TABLET BY MOUTH EVERY DAY 90 tablet 1  . furosemide (LASIX) 20 MG tablet Take 1-3 tablets (20-60 mg total) by mouth daily. 200 tablet 1  . glucose blood test strip Check sugar three times daily  Dx: DMII insulin dependent with retinopathy, neuropathy controlled 300 each 3  . HYDROcodone-acetaminophen (NORCO) 10-325 MG tablet Take 1 tablet by mouth every 12 (twelve) hours as needed. 60 tablet 0  . insulin aspart (NOVOLOG) 100 UNIT/ML  injection Inject 25 Units into the skin 3 (three) times daily with meals. 10 mL 11  . Insulin Syringes, Disposable, U-100 0.5 ML MISC 28 Units by Does not apply route 2 (two) times daily. 100 each 11  . leflunomide (ARAVA) 20 MG tablet Take 20 mg by mouth daily.     Marland Kitchen LEVEMIR 100 UNIT/ML injection INJECT 60 UNITS AT BEDTIME AS DIRECTED 20 mL 1  . Needles & Syringes MISC 1 Syringe by Does not apply route 2 (two) times daily. 100 each 11  . omeprazole (PRILOSEC) 20 MG capsule TAKE 1 CAPSULE BY MOUTH EVERY DAY 90 capsule 3  . oxybutynin (DITROPAN XL) 15 MG 24 hr tablet TAKE 1 TABLET BY MOUTH AT BEDTIME 90  tablet 3  . predniSONE (DELTASONE) 5 MG tablet Take 5 mg by mouth daily with breakfast. Only takes with RA Flare.    . rosuvastatin (CRESTOR) 10 MG tablet Take 1 tablet (10 mg total) by mouth daily. 90 tablet 1  . traZODone (DESYREL) 100 MG tablet TAKE 2 TABLETS BY MOUTH EVERY DAY AT BEDTIME 180 tablet 1  . ULTICARE INSULIN SYRINGE 31G X 5/16" 0.5 ML MISC USE AS DIRECTED TO INJECT INSULIN 2 TIMES DAILY 100 each 4   No current facility-administered medications on file prior to visit.     PAST MEDICAL HISTORY: Past Medical History:  Diagnosis Date  . Allergy    generic allergy pill; Spring and Fall only.  . Anxiety   . Arthritis    DDD lumbar, R hip OA.  s/p ortho consult in past.  . Blood transfusion without reported diagnosis    Mountain climbing accident in [place].  . Brachial plexus disorders   . Cataract    B retractions.  . Chronic kidney disease    stage 3 per pt.   . Chronic pain syndrome   . Chronic renal insufficiency, stage 3 (moderate) (HCC)   . Constipation   . DDD (degenerative disc disease), lumbar   . Depression   . Diabetes mellitus   . Diabetic peripheral neuropathy associated with type 2 diabetes mellitus ([place])   . Diabetic retinopathy ([place])   . Diabetic retinopathy associated with type 2 diabetes mellitus ([place])    s/p laser treatment multiple.  Unable to drive.  . Fatty liver   . Fibromyalgia   . Food allergy   . GERD (gastroesophageal reflux disease)   . Hypercholesteremia   . Hyperlipidemia   . Hypertension    controlled, off meds   . IBS (irritable bowel syndrome)   . Leg edema   . Neuromuscular disorder ([place])   . OSA (obstructive sleep apnea)   . Osteoarthritis   . Rheumatic fever   . Rheumatoid arthritis ([place])   . Stomach ulcer   . Swallowing difficulty   . TIA (transient ischemic attack)   . Ulcer    Peptic ulcer H. Pylori + s/p treatment.  Upper GI diagnosed.Angel French .    PAST SURGICAL HISTORY: Past Surgical History:    Procedure Laterality Date  .  2 SPINAL INJECTIONS     . ABDOMINAL HYSTERECTOMY  11/02/1979   DUB; cervical dysplasia; ovaries intact.  . ABDOMINAL SURGERY     staph abcess   . [facility] Admission     age 28; three months in [place].  Marland Kitchen BREAST BIOPSY    . CARDIAC CATHETERIZATION  11/02/2007   normal coronary arteries.  . CARPAL TUNNEL RELEASE     Bilateral.  .  CATARACT EXTRACTION, BILATERAL    . CHOLECYSTECTOMY    . ESOPHAGEAL MANOMETRY N/A 09/14/2017   Procedure: ESOPHAGEAL MANOMETRY (EM);  Surgeon: Ronnette Juniper, MD;  Location: [facility];  Service: Gastroenterology;  Laterality: N/A;  . EYE SURGERY     Cataracts B. Laser surgery x 7 for Diabetic Retinopathy  . TONSILLECTOMY      SOCIAL HISTORY: Social History   Tobacco Use  . Smoking status: Former Research scientist (life sciences)  . Smokeless tobacco: Never Used  . Tobacco comment: Quit 1987  Substance Use Topics  . Alcohol use: No    Alcohol/week: 0.0 standard drinks  . Drug use: No    FAMILY HISTORY: Family History  Adopted: Yes  Family history unknown: Yes    ROS: Review of Systems  Constitutional: Positive for malaise/fatigue and weight loss.  Gastrointestinal: Negative for nausea and vomiting.  Musculoskeletal:       Negative for muscle weakness    PHYSICAL EXAM: Blood pressure 120/71, pulse 90, temperature 97.9 F (36.6 C), temperature source Oral, height '5\' 2"'$  (1.575 m), weight 212 lb (96.2 kg), SpO2 97 %. Body mass index is 38.78 kg/m. Physical Exam  Constitutional: She is oriented to person, place, and time. She appears well-developed and well-nourished.  Cardiovascular: Normal rate.  Pulmonary/Chest: Effort normal.  Musculoskeletal: Normal range of motion.  Neurological: She is oriented to person, place, and time.  Skin: Skin is warm and dry.  Psychiatric: She has a normal mood and affect. Her behavior is normal.  Vitals reviewed.   RECENT LABS AND TESTS: BMET    Component Value Date/Time   NA 140  03/07/2018 1310   K 3.7 03/07/2018 1310   CL 97 03/07/2018 1310   CO2 28 03/07/2018 1310   GLUCOSE 111 (H) 03/07/2018 1310   GLUCOSE 58 (L) 09/08/2016 1035   BUN 26 03/07/2018 1310   CREATININE 1.36 (H) 03/07/2018 1310   CREATININE 1.30 (H) 09/08/2016 1035   CALCIUM 9.4 03/07/2018 1310   GFRNONAA 41 (L) 03/07/2018 1310   GFRNONAA 36 (L) 07/10/2014 1354   GFRAA 47 (L) 03/07/2018 1310   GFRAA 41 (L) 07/10/2014 1354   Lab Results  Component Value Date   HGBA1C 8.1 (H) 03/07/2018   HGBA1C 11.0 01/09/2018   HGBA1C 8.0 10/07/2017   HGBA1C 7.0 06/28/2017   HGBA1C 7.3 (H) 03/23/2017   Lab Results  Component Value Date   INSULIN 6.2 03/07/2018   CBC    Component Value Date/Time   WBC 5.1 01/30/2018 1000   WBC 5.7 09/08/2016 1035   RBC 3.38 (L) 01/30/2018 1000   RBC 3.82 09/08/2016 1035   HGB 10.9 (L) 01/30/2018 1000   HCT 30.8 (L) 01/30/2018 1000   PLT 226 01/30/2018 1000   MCV 91 01/30/2018 1000   MCH 32.2 01/30/2018 1000   MCH 29.8 09/08/2016 1035   MCHC 35.4 01/30/2018 1000   MCHC 32.5 09/08/2016 1035   RDW 14.8 01/30/2018 1000   LYMPHSABS 1.7 01/30/2018 1000   MONOABS 513 09/08/2016 1035   EOSABS 0.3 01/30/2018 1000   BASOSABS 0.0 01/30/2018 1000   Iron/TIBC/Ferritin/ %Sat    Component Value Date/Time   IRON 79 02/03/2016 1015   TIBC 309 02/03/2016 1015   IRONPCTSAT 26 02/03/2016 1015   Lipid Panel     Component Value Date/Time   CHOL 168 03/07/2018 1310   TRIG 237 (H) 03/07/2018 1310   HDL 40 03/07/2018 1310   CHOLHDL 5.1 (H) 01/09/2018 1221   CHOLHDL 5.3 (H) 09/08/2016 1035  VLDL 46 (H) 09/08/2016 1035   LDLCALC 81 03/07/2018 1310   Hepatic Function Panel     Component Value Date/Time   PROT 7.4 03/07/2018 1310   ALBUMIN 4.1 03/07/2018 1310   AST 40 03/07/2018 1310   ALT 36 (H) 03/07/2018 1310   ALKPHOS 87 03/07/2018 1310   BILITOT 0.3 03/07/2018 1310      Component Value Date/Time   TSH 0.958 03/07/2018 1310   TSH 0.874 01/30/2018 1000    TSH 1.060 03/23/2017 1230   Results for SHAKARIA, RAPHAEL (MRN [id number]) as of 07/20/2018 11:16  Ref. Range 03/07/2018 13:10  Vitamin D, 25-Hydroxy Latest Ref Range: 30.0 - 100.0 ng/mL 54.8   ASSESSMENT AND PLAN: Type 2 diabetes mellitus without complication, with long-term current use of insulin (HCC)  Vitamin D deficiency  Class 2 severe obesity with serious comorbidity and body mass index (BMI) of 38.0 to 38.9 in adult, unspecified obesity type ([place])  PLAN:  Diabetes II Iria has been given extensive diabetes education by myself today including ideal fasting and post-prandial blood glucose readings, individual ideal Hgb A1c goals and hypoglycemia prevention. We discussed the importance of good blood sugar control to decrease the likelihood of diabetic complications such as nephropathy, neuropathy, limb loss, blindness, coronary artery disease, and death. We discussed the importance of intensive lifestyle modification including diet, exercise and weight loss as the first line treatment for diabetes. Sidra agrees to continue her diabetes medications and will follow up at the agreed upon time.  Vitamin D Deficiency Netanya was informed that low vitamin D levels contributes to fatigue and are associated with obesity, breast, and colon cancer. She agrees to continue to take OTC Vit D '@5'$ ,000 IU daily and will follow up for routine testing of vitamin D, at least 2-3 times per year. She was informed of the risk of over-replacement of vitamin D and agrees to not increase her dose unless she discusses this with [name] first. We will check labs at the next appointment and Autie agrees to follow up at the agreed upon time.  I spent > than 50% of the 15 minute visit on counseling as documented in the note.  Obesity Lyndsi is currently in the action stage of change. As such, her goal is to continue with weight loss efforts She has agreed to keep a food journal with 1200 calories and 85+ grams of protein  daily Xian has been instructed to work up to a goal of 150 minutes of combined cardio and strengthening exercise per week for weight loss and overall health benefits. We discussed the following Behavioral Modification Strategies today: planning for success, keep a strict food journal, increasing lean protein intake and work on meal planning and easy cooking plans  Ellesse has agreed to follow up with our clinic in 2 weeks. She was informed of the importance of frequent follow up visits to maximize her success with intensive lifestyle modifications for her multiple health conditions.   OBESITY BEHAVIORAL INTERVENTION VISIT  Today's visit was # 8  Starting weight: 224 lbs Starting date: 03/07/18 Today's weight : 212 lbs  Today's date: 07/19/2018 Total lbs lost to date: 12   ASK: We discussed the diagnosis of obesity with Jennier S Kinton today and Ivianna agreed to give [name] permission to discuss obesity behavioral modification therapy today.  ASSESS: Aela has the diagnosis of obesity and her BMI today is 38.77 Tressa is in the action stage of change   ADVISE: Amayia was educated on the multiple  health risks of obesity as well as the benefit of weight loss to improve her health. She was advised of the need for long term treatment and the importance of lifestyle modifications to improve her current health and to decrease her risk of future health problems.  AGREE: Multiple dietary modification options and treatment options were discussed and  Elya agreed to follow the recommendations documented in the above note.  ARRANGE: Lorra was educated on the importance of frequent visits to treat obesity as outlined per CMS and USPSTF guidelines and agreed to schedule her next follow up appointment today.  I, Doreene Nest, am acting as transcriptionist for Eber Jones, MD  I have reviewed the above documentation for accuracy and completeness, and I agree with the above. - Ilene Qua, MD

## 2018-07-21 DIAGNOSIS — M533 Sacrococcygeal disorders, not elsewhere classified: Secondary | ICD-10-CM | POA: Diagnosis not present

## 2018-07-21 DIAGNOSIS — W19XXXA Unspecified fall, initial encounter: Secondary | ICD-10-CM | POA: Diagnosis not present
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
## 2018-08-01 ENCOUNTER — Other Ambulatory Visit: Payer: Self-pay | Admitting: Family Medicine

## 2018-08-01 DIAGNOSIS — M0600 Rheumatoid arthritis without rheumatoid factor, unspecified site: Secondary | ICD-10-CM | POA: Diagnosis not present

## 2018-08-01 DIAGNOSIS — N183 Chronic kidney disease, stage 3 (moderate): Secondary | ICD-10-CM | POA: Diagnosis not present

## 2018-08-01 DIAGNOSIS — E1122 Type 2 diabetes mellitus with diabetic chronic kidney disease: Secondary | ICD-10-CM | POA: Diagnosis not present

## 2018-08-01 DIAGNOSIS — I129 Hypertensive chronic kidney disease with stage 1 through stage 4 chronic kidney disease, or unspecified chronic kidney disease: Secondary | ICD-10-CM | POA: Diagnosis not present

## 2018-08-01 DIAGNOSIS — N83299 Other ovarian cyst, unspecified side: Secondary | ICD-10-CM

## 2018-08-01 DIAGNOSIS — D631 Anemia in chronic kidney disease: Secondary | ICD-10-CM | POA: Diagnosis not present

## 2018-08-03 ENCOUNTER — Ambulatory Visit (INDEPENDENT_AMBULATORY_CARE_PROVIDER_SITE_OTHER): Payer: Medicare Other | Admitting: Family Medicine

## 2018-08-03 ENCOUNTER — Ambulatory Visit (INDEPENDENT_AMBULATORY_CARE_PROVIDER_SITE_OTHER): Payer: Medicare Other | Admitting: Psychology

## 2018-08-03 VITALS — BP 119/73 | HR 81 | Temp 97.6°F | Ht 62.0 in | Wt 210.0 lb

## 2018-08-03 DIAGNOSIS — Z6838 Body mass index (BMI) 38.0-38.9, adult: Secondary | ICD-10-CM | POA: Diagnosis not present

## 2018-08-03 DIAGNOSIS — I6523 Occlusion and stenosis of bilateral carotid arteries: Secondary | ICD-10-CM | POA: Diagnosis not present

## 2018-08-03 DIAGNOSIS — F3289 Other specified depressive episodes: Secondary | ICD-10-CM

## 2018-08-03 DIAGNOSIS — E119 Type 2 diabetes mellitus without complications: Secondary | ICD-10-CM

## 2018-08-03 DIAGNOSIS — E559 Vitamin D deficiency, unspecified: Secondary | ICD-10-CM | POA: Diagnosis not present

## 2018-08-03 DIAGNOSIS — Z794 Long term (current) use of insulin: Secondary | ICD-10-CM | POA: Diagnosis not present

## 2018-08-03 NOTE — Progress Notes (Signed)
Office: [phone]  /  Fax: [phone]   HPI:   Chief Complaint: OBESITY Angel French is here to discuss her progress with her obesity treatment plan. She is keeping a food journal with 1200 to 1300 calories and 85 grams of protein and is following her eating plan approximately 75 to 80 % of the time. She states she is exercising 0 minutes 0 times per week. Angel French is doing a bit of emotional eating secondary to her wedding anniversary (her husband died in 21-Nov-2022). She has been eating chips and ice cream.  Her weight is 210 lb (95.3 kg) today and has had a weight loss of 2 pounds over a period of 2 weeks since her last visit. She has lost 14 lbs since starting treatment with [name].  Diabetes II Angel French has a diagnosis of diabetes type II. Angel French states her fasting BGs are in the 120'French. Her last A1c was 8.1 was 03/07/18. She has been working on intensive lifestyle modifications including diet, exercise, and weight loss to help control her blood glucose levels.  Vitamin D deficiency Angel French has a diagnosis of vitamin D deficiency. She is currently OTC taking vit D. She admits fatigue and denies nausea, vomiting or muscle weakness.  ALLERGIES: Allergies  Allergen Reactions  . Codeine Anaphylaxis  . Contrast Media [Iodinated Diagnostic Agents] Anaphylaxis  . Nitrofurantoin Monohyd Macro Anaphylaxis  . Betadine [Povidone Iodine] Itching  . Folic Acid Itching  . Gabapentin Other (See Comments)    Makes patient feel drunk  . Iodine Hives  . Lyrica [Pregabalin] Other (See Comments)    Makes patient feel drunk  . Red Dye Itching  . Ultram [Tramadol Hcl] Nausea And Vomiting    MEDICATIONS: Current Outpatient Medications on File Prior to Visit  Medication Sig Dispense Refill  . acetaminophen (TYLENOL) 325 MG tablet Take 650 mg by mouth every 6 (six) hours as needed (every 6 weeks before RA infusion).    Marland Kitchen allopurinol (ZYLOPRIM) 100 MG tablet Take 1 tablet (100 mg total) by mouth daily. Ov needed  (Patient taking differently: Take 200 mg by mouth daily. ) 90 tablet 3  . aspirin 325 MG tablet Take 325 mg by mouth daily.     . Blood Glucose Monitoring Suppl (BLOOD GLUCOSE METER KIT AND SUPPLIES) KIT Dispense based on patient and insurance preference. Use up to four times daily as directed. (FOR ICD-9 250.00, 250.01). 1 each 11  . Cholecalciferol (VITAMIN D3) 5000 units CAPS Take 1 capsule by mouth daily.    . diclofenac sodium (VOLTAREN) 1 % GEL Apply 2 g topically 4 (four) times daily. (Patient taking differently: Apply 2 g topically 2 (two) times daily as needed (pain). ) 100 g 3  . diphenhydrAMINE (BENADRYL) 25 MG tablet Take 25 mg by mouth every 6 (six) hours as needed (every 6 weeks prior to RA infusion).    . DULoxetine (CYMBALTA) 30 MG capsule TAKE 1 CAPSULE BY MOUTH EVERY DAY 90 capsule 3  . escitalopram (LEXAPRO) 20 MG tablet TAKE 1 TABLET BY MOUTH EVERY DAY 90 tablet 1  . furosemide (LASIX) 20 MG tablet Take 1-3 tablets (20-60 mg total) by mouth daily. 200 tablet 1  . glucose blood test strip Check sugar three times daily  Dx: DMII insulin dependent with retinopathy, neuropathy controlled 300 each 3  . HYDROcodone-acetaminophen (NORCO) 10-325 MG tablet Take 1 tablet by mouth every 12 (twelve) hours as needed. 60 tablet 0  . insulin aspart (NOVOLOG) 100 UNIT/ML injection Inject 25 Units into  the skin 3 (three) times daily with meals. 10 mL 11  . Insulin Syringes, Disposable, U-100 0.5 ML MISC 28 Units by Does not apply route 2 (two) times daily. 100 each 11  . leflunomide (ARAVA) 20 MG tablet Take 20 mg by mouth daily.     Marland Kitchen LEVEMIR 100 UNIT/ML injection INJECT 60 UNITS AT BEDTIME AS DIRECTED 20 mL 1  . Needles & Syringes MISC 1 Syringe by Does not apply route 2 (two) times daily. 100 each 11  . omeprazole (PRILOSEC) 20 MG capsule TAKE 1 CAPSULE BY MOUTH EVERY DAY 90 capsule 3  . oxybutynin (DITROPAN XL) 15 MG 24 hr tablet TAKE 1 TABLET BY MOUTH AT BEDTIME 90 tablet 3  . predniSONE  (DELTASONE) 5 MG tablet Take 5 mg by mouth daily with breakfast. Only takes with RA Flare.    . rosuvastatin (CRESTOR) 10 MG tablet Take 1 tablet (10 mg total) by mouth daily. 90 tablet 1  . traZODone (DESYREL) 100 MG tablet TAKE 2 TABLETS BY MOUTH EVERY DAY AT BEDTIME 180 tablet 1  . ULTICARE INSULIN SYRINGE 31G X 5/16" 0.5 ML MISC USE AS DIRECTED TO INJECT INSULIN 2 TIMES DAILY 100 each 4   No current facility-administered medications on file prior to visit.     PAST MEDICAL HISTORY: Past Medical History:  Diagnosis Date  . Allergy    generic allergy pill; Spring and Fall only.  . Anxiety   . Arthritis    DDD lumbar, R hip OA.  French/p ortho consult in past.  . Blood transfusion without reported diagnosis    Mountain climbing accident in [place].  . Brachial plexus disorders   . Cataract    B retractions.  . Chronic kidney disease    stage 3 per pt.   . Chronic pain syndrome   . Chronic renal insufficiency, stage 3 (moderate) (HCC)   . Constipation   . DDD (degenerative disc disease), lumbar   . Depression   . Diabetes mellitus   . Diabetic peripheral neuropathy associated with type 2 diabetes mellitus ([place])   . Diabetic retinopathy ([place])   . Diabetic retinopathy associated with type 2 diabetes mellitus ([place])    French/p laser treatment multiple.  Unable to drive.  . Fatty liver   . Fibromyalgia   . Food allergy   . GERD (gastroesophageal reflux disease)   . Hypercholesteremia   . Hyperlipidemia   . Hypertension    controlled, off meds   . IBS (irritable bowel syndrome)   . Leg edema   . Neuromuscular disorder ([place])   . OSA (obstructive sleep apnea)   . Osteoarthritis   . Rheumatic fever   . Rheumatoid arthritis ([place])   . Stomach ulcer   . Swallowing difficulty   . TIA (transient ischemic attack)   . Ulcer    Peptic ulcer H. Pylori + French/p treatment.  Upper GI diagnosed.[name] Prilosec PRN .    PAST SURGICAL HISTORY: Past Surgical History:  Procedure Laterality Date    .  2 SPINAL INJECTIONS     . ABDOMINAL HYSTERECTOMY  11/02/1979   DUB; cervical dysplasia; ovaries intact.  . ABDOMINAL SURGERY     staph abcess   . [facility] Ad[facility]     age 67; three months in [place].  Marland Kitchen BREAST BIOPSY    . CARDIAC CATHETERIZATION  11/02/2007   normal coronary arteries.  . CARPAL TUNNEL RELEASE     Bilateral.  . CATARACT EXTRACTION, BILATERAL    .  CHOLECYSTECTOMY    . ESOPHAGEAL MANOMETRY N/A 09/14/2017   Procedure: ESOPHAGEAL MANOMETRY (EM);  Surgeon: Angel Juniper, MD;  Location: [facility];  Service: Gastroenterology;  Laterality: N/A;  . EYE SURGERY     Cataracts B. Laser surgery x 7 for Diabetic Retinopathy  . TONSILLECTOMY      SOCIAL HISTORY: Social History   Tobacco Use  . Smoking status: Former Research scientist (life sciences)  . Smokeless tobacco: Never Used  . Tobacco comment: Quit 1987  Substance Use Topics  . Alcohol use: No    Alcohol/week: 0.0 standard drinks  . Drug use: No    FAMILY HISTORY: Family History  Adopted: Yes  Family history unknown: Yes    ROS: Review of Systems  Constitutional: Positive for malaise/fatigue and weight loss.  Gastrointestinal: Negative for nausea and vomiting.  Musculoskeletal:       Negative for muscle weakness.    PHYSICAL EXAM: Blood pressure 119/73, pulse 81, temperature 97.6 F (36.4 C), temperature source Oral, height _0  (1.575 m), weight 210 lb (95.3 kg), SpO2 96 %. Body mass index is 38.41 kg/m. Physical Exam  Constitutional: She is oriented to person, place, and time. She appears well-developed and well-nourished.  Cardiovascular: Normal rate.  Pulmonary/Chest: Effort normal.  Musculoskeletal: Normal range of motion.  Neurological: She is oriented to person, place, and time.  Skin: Skin is warm and dry.  Psychiatric: She has a normal mood and affect. Her behavior is normal.  Vitals reviewed.   RECENT LABS AND TESTS: BMET    Component Value Date/Time   NA 140 03/07/2018 1310   K 3.7  03/07/2018 1310   CL 97 03/07/2018 1310   CO2 28 03/07/2018 1310   GLUCOSE 111 (H) 03/07/2018 1310   GLUCOSE 58 (L) 09/08/2016 1035   BUN 26 03/07/2018 1310   CREATININE 1.36 (H) 03/07/2018 1310   CREATININE 1.30 (H) 09/08/2016 1035   CALCIUM 9.4 03/07/2018 1310   GFRNONAA 41 (L) 03/07/2018 1310   GFRNONAA 36 (L) 07/10/2014 1354   GFRAA 47 (L) 03/07/2018 1310   GFRAA 41 (L) 07/10/2014 1354   Lab Results  Component Value Date   HGBA1C 8.1 (H) 03/07/2018   HGBA1C 11.0 01/09/2018   HGBA1C 8.0 10/07/2017   HGBA1C 7.0 06/28/2017   HGBA1C 7.3 (H) 03/23/2017   Lab Results  Component Value Date   INSULIN 6.2 03/07/2018   CBC    Component Value Date/Time   WBC 5.1 01/30/2018 1000   WBC 5.7 09/08/2016 1035   RBC 3.38 (L) 01/30/2018 1000   RBC 3.82 09/08/2016 1035   HGB 10.9 (L) 01/30/2018 1000   HCT 30.8 (L) 01/30/2018 1000   PLT 226 01/30/2018 1000   MCV 91 01/30/2018 1000   MCH 32.2 01/30/2018 1000   MCH 29.8 09/08/2016 1035   MCHC 35.4 01/30/2018 1000   MCHC 32.5 09/08/2016 1035   RDW 14.8 01/30/2018 1000   LYMPHSABS 1.7 01/30/2018 1000   MONOABS 513 09/08/2016 1035   EOSABS 0.3 01/30/2018 1000   BASOSABS 0.0 01/30/2018 1000   Iron/TIBC/Ferritin/ %Sat    Component Value Date/Time   IRON 79 02/03/2016 1015   TIBC 309 02/03/2016 1015   IRONPCTSAT 26 02/03/2016 1015   Lipid Panel     Component Value Date/Time   CHOL 168 03/07/2018 1310   TRIG 237 (H) 03/07/2018 1310   HDL 40 03/07/2018 1310   CHOLHDL 5.1 (H) 01/09/2018 1221   CHOLHDL 5.3 (H) 09/08/2016 1035   VLDL 46 (H) 09/08/2016 1035  LDLCALC 81 03/07/2018 1310   Hepatic Function Panel     Component Value Date/Time   PROT 7.4 03/07/2018 1310   ALBUMIN 4.1 03/07/2018 1310   AST 40 03/07/2018 1310   ALT 36 (H) 03/07/2018 1310   ALKPHOS 87 03/07/2018 1310   BILITOT 0.3 03/07/2018 1310      Component Value Date/Time   TSH 0.958 03/07/2018 1310   TSH 0.874 01/30/2018 1000   TSH 1.060 03/23/2017 1230    Results for JACQUELENE, KOPECKY (MRN [id number]) as of 08/03/2018 15:10  Ref. Range 03/07/2018 13:10  Vitamin D, 25-Hydroxy Latest Ref Range: 30.0 - 100.0 ng/mL 54.8   ASSESSMENT AND PLAN: Type 2 diabetes mellitus without complication, with long-term current use of insulin (HCC) - Plan: Hemoglobin A1c, Lipid panel, C-peptide, Comprehensive metabolic panel  Vitamin D deficiency - Plan: VITAMIN D 25 Hydroxy (Vit-D Deficiency, Fractures)  Class 2 severe obesity with serious comorbidity and body mass index (BMI) of 38.0 to 38.9 in adult, unspecified obesity type ([place])  PLAN:  Diabetes II Angel French has been given extensive diabetes education by myself today including ideal fasting and post-prandial blood glucose readings, individual ideal Hgb A1c goals and hypoglycemia prevention. We discussed the importance of good blood sugar control to decrease the likelihood of diabetic complications such as nephropathy, neuropathy, limb loss, blindness, coronary artery disease, and death. We discussed the importance of intensive lifestyle modification including diet, exercise and weight loss as the first line treatment for diabetes. We will draw a Hgb A1c, a C-peptide, FLP, and CMP today. Angel French agrees to continue her diabetes medications and will follow up at the agreed upon time.  Vitamin D Deficiency Angel French was informed that low vitamin D levels contributes to fatigue and are associated with obesity, breast, and colon cancer. She agrees to continue to take prescription Vit D _0 ,000 IU every week and will follow up for routine testing of vitamin D, at least 2-3 times per year. She was informed of the risk of over-replacement of vitamin D and agrees to not increase her dose unless she discusses this with [name] first. We will draw a vitamin D level today.  Obesity Angel French is currently in the action stage of change. As such, her goal is to continue with weight loss efforts. She has agreed to keep a food journal with 1200  to 1300 calories and 85 grams of protein daily. Angel French has been instructed to work up to a goal of 150 minutes of combined cardio and strengthening exercise per week for weight loss and overall health benefits. We discussed the following Behavioral Modification Strategies today: increasing lean protein intake, increasing vegetables, work on meal planning and easy cooking plans, planning for success.  Angel French has agreed to follow up with our clinic in 2 weeks. She was informed of the importance of frequent follow up visits to maximize her success with intensive lifestyle modifications for her multiple health conditions.   OBESITY BEHAVIORAL INTERVENTION VISIT  Today'French visit was # 9   Starting weight: 224 lbs Starting date: 03/07/18 Today'French weight : Weight: 210 lb (95.3 kg)  Today'French date: 08/03/2018 Total lbs lost to date: 14 At least 15 minutes were spent on discussing the following behavioral intervention visit.  ASK: We discussed the diagnosis of obesity with Angel French today and Angel French agreed to give [name] permission to discuss obesity behavioral modification therapy today.  ASSESS: Angel French has the diagnosis of obesity and her BMI today is 38.4. Angel French is in the action stage of  change.   ADVISE: Angel French was educated on the multiple health risks of obesity as well as the benefit of weight loss to improve her health. She was advised of the need for long term treatment and the importance of lifestyle modifications to improve her current health and to decrease her risk of future health problems.  AGREE: Multiple dietary modification options and treatment options were discussed and Angel French agreed to follow the recommendations documented in the above note.  ARRANGE: Angel French was educated on the importance of frequent visits to treat obesity as outlined per CMS and USPSTF guidelines and agreed to schedule her next follow up appointment today.  I, Marcille Blanco, am acting as Location manager for  Eber Jones, MD  I have reviewed the above documentation for accuracy and completeness, and I agree with the above. - Ilene Qua, MD

## 2018-08-04 LAB — COMPREHENSIVE METABOLIC PANEL
A/G RATIO: 1.2 (ref 1.2–2.2)
ALK PHOS: 98 IU/L (ref 39–117)
ALT: 16 IU/L (ref 0–32)
AST: 24 IU/L (ref 0–40)
Albumin: 4 g/dL (ref 3.6–4.8)
BILIRUBIN TOTAL: 0.3 mg/dL (ref 0.0–1.2)
BUN/Creatinine Ratio: 28 (ref 12–28)
BUN: 38 mg/dL — AB (ref 8–27)
CHLORIDE: 96 mmol/L (ref 96–106)
CO2: 29 mmol/L (ref 20–29)
Calcium: 9.9 mg/dL (ref 8.7–10.3)
Creatinine, Ser: 1.35 mg/dL — ABNORMAL HIGH (ref 0.57–1.00)
GFR calc Af Amer: 47 mL/min/{1.73_m2} — ABNORMAL LOW (ref >59)
GFR calc non Af Amer: 41 mL/min/{1.73_m2} — ABNORMAL LOW (ref >59)
GLUCOSE: 97 mg/dL (ref 65–99)
Globulin, Total: 3.3 g/dL (ref 1.5–4.5)
POTASSIUM: 3.9 mmol/L (ref 3.5–5.2)
Sodium: 141 mmol/L (ref 134–144)
Total Protein: 7.3 g/dL (ref 6.0–8.5)

## 2018-08-04 LAB — HEMOGLOBIN A1C
ESTIMATED AVERAGE GLUCOSE: 126 mg/dL
HEMOGLOBIN A1C: 6 % — AB (ref 4.8–5.6)

## 2018-08-04 LAB — LIPID PANEL
CHOL/HDL RATIO: 3.9 ratio (ref 0.0–4.4)
Cholesterol, Total: 124 mg/dL (ref 100–199)
HDL: 32 mg/dL — ABNORMAL LOW (ref >39)
LDL Calculated: 51 mg/dL (ref 0–99)
Triglycerides: 204 mg/dL — ABNORMAL HIGH (ref 0–149)
VLDL Cholesterol Cal: 41 mg/dL — ABNORMAL HIGH (ref 5–40)

## 2018-08-04 LAB — C-PEPTIDE: C-Peptide: 3.6 ng/mL (ref 1.1–4.4)

## 2018-08-04 LAB — VITAMIN D 25 HYDROXY (VIT D DEFICIENCY, FRACTURES): VIT D 25 HYDROXY: 59 ng/mL (ref 30.0–100.0)

## 2018-08-07 ENCOUNTER — Ambulatory Visit
Admission: RE | Admit: 2018-08-07 | Discharge: 2018-08-07 | Disposition: A | Discharge status: Home / Self Care | Payer: Medicare Other | Source: Ambulatory Visit | Attending: Family Medicine | Admitting: Family Medicine

## 2018-08-07 DIAGNOSIS — N83299 Other ovarian cyst, unspecified side: Secondary | ICD-10-CM

## 2018-08-07 DIAGNOSIS — N83202 Unspecified ovarian cyst, left side: Secondary | ICD-10-CM | POA: Diagnosis not present

## 2018-08-09 ENCOUNTER — Other Ambulatory Visit: Payer: Medicare Other

## 2018-08-17 ENCOUNTER — Ambulatory Visit (INDEPENDENT_AMBULATORY_CARE_PROVIDER_SITE_OTHER): Payer: Medicare Other | Admitting: Psychology

## 2018-08-17 ENCOUNTER — Ambulatory Visit (INDEPENDENT_AMBULATORY_CARE_PROVIDER_SITE_OTHER): Payer: Medicare Other | Admitting: Family Medicine

## 2018-08-17 VITALS — BP 134/75 | HR 70 | Temp 97.6°F | Ht 62.0 in | Wt 209.0 lb

## 2018-08-17 DIAGNOSIS — Z794 Long term (current) use of insulin: Secondary | ICD-10-CM | POA: Diagnosis not present

## 2018-08-17 DIAGNOSIS — F3289 Other specified depressive episodes: Secondary | ICD-10-CM

## 2018-08-17 DIAGNOSIS — I6523 Occlusion and stenosis of bilateral carotid arteries: Secondary | ICD-10-CM | POA: Diagnosis not present

## 2018-08-17 DIAGNOSIS — E119 Type 2 diabetes mellitus without complications: Secondary | ICD-10-CM

## 2018-08-17 DIAGNOSIS — Z6838 Body mass index (BMI) 38.0-38.9, adult: Secondary | ICD-10-CM | POA: Diagnosis not present

## 2018-08-17 NOTE — Progress Notes (Signed)
Office: [phone]  /  Fax: [phone]   Date: August 17, 2018 Time Seen: 11:55am Duration: 30 minutes Provider: Glennie French, Psy.D. Type of Session: Individual Therapy   HPI: Randiewas referred by Dr. Ilene French due todepression with emotional eating behaviors, and was seen for an initial appointment with this provider on May 23, 2018. Per the note for a visit with Dr. Adair French on May 01, 2018, "Randiehad lots of emotional eating after the roof collapse. She has struggled with emotional eating for years. She struggleswith emotional eating and using food for comfort to the extent that it is negatively impacting herhealth. Sheoften snacks when sheis not hungry. Randiesometimes feels sheis out of control and then feels guilty that shemade poor food choices. Angel French been working on behavior modification techniques to help reduce heremotional eating and has been somewhat successful.Sheshows no sign of suicidal or homicidal ideations." Per the note for the initial visit with Dr. Adair French on Mar 07, 2018, Randiehas been heavy most of herlifeand shestarted gaining weight over the last 12 yrs or so. Herheaviest weight ever was 335pounds. 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 Angel French stated her daughter and granddaughter reside with her, and "There is always stress." She described the stress as "permanent" and added, "I see no way around it." Angel French elaborated she plays a vital role in her granddaughter's upbringing and her granddaughters suffers from "severe seizures." Angel French stated she uses food for comfort when experiencing stress; however, she described increased awareness of her eating choices.Furthermore, Angel French reported "major stresses" starting in childhood, as her brother was "moderately retarded" and  "violent." After her parents passed away, Angel French shared she was his guardian, but later asked the Court to take away the guardianship.Randiewas asked to complete a questionnaire assessing various behaviors related to emotional eating. Randieendorsed the following: overeat when you are celebrating; eat certain foods when you are anxious, stressed, depressed, or your feelings are hurt; use food to help you cope with emotional situations; find food is comforting to you; and overeat when you are worried about something.    Session Content: Session focused on the following treatment goal: decrease emotional eating. The session was initiated with the administration of the PHQ-9 and GAD-7, as well as a brief check-in. Angel French reported, "the first couple days were really rough" after the last appointment with this provider resulting in her not following her meal plan "100%." More specifically, Angel French explained she did not eat breakfast and was not consistent with lunch. She indicated a couple episodes of emotional eating; however, she stated she ate fruit when experiencing emotional hunger. Overall, Angel French indicated she "made better choices" when it came to eating. Regarding the memories of trauma that were triggered by her weight during the last appointment Dr. Adair French, Angel French indicated she "chose not to dwell on anything." She acknowledged that this approach was likely to help avoid thinking about the trauma. She reported engaging in positive self talk and speaking with her daughter to help her get through the past two weeks. Session then focused on termination planning.  This provider explained that while treatment with this provider is usually four to six sessions, an additional appointment can be scheduled in two weeks, as this provider and Angel French did not have the opportunity to discuss termination planning during the previous appointment. Angel French acknowledged understanding and was receptive to  scheduling an  additional appointment in two weeks. She explained it would give her time to decide if she would like a referral to continue therapeutic services. Angel French was led through the leaves on a stream exercise. She described it as relaxing and was receptive to practicing it between now and the next appointment. Overall, she shared that since starting treatment with this provider there has "absolutely" been a decrease in her emotional eating and added, "it is not a normal thing anymore."  She indicated that she now knows what to look for and noted a 75% decrease in emotional eating. Furthermore, Angel French was receptive to today's appointment as evidenced by her openness to sharing, responsiveness to feedback, and engagement in the thought defusion exercise.  Mental Status Examination: Angel French arrived early for the appointment; therefore, the appointment was initiated early. She presented as appropriately dressed and groomed. Angel French appeared her stated age and demonstrated adequate orientation to time, place, person, and purpose of the appointment. She also demonstrated appropriate eye contact. No psychomotor abnormalities or behavioral peculiarities noted. Her mood was euthymic with congruent affect. Her thought processes were logical, linear, and goal-directed. No hallucinations, delusions, bizarre thinking or behavior reported or observed. Judgment, insight, and impulse control appeared to be grossly intact. There was no evidence of paraphasias (i.e., errors in speech, gross mispronunciations, and word substitutions), repetition deficits, or disturbances in volume or prosody (i.e., rhythm and intonation). There was no evidence of attention or memory impairments. Angel French denied current suicidal and homicidal ideation, intent or plan.  Structured Assessment Results: The Patient Health Questionnaire-9 (PHQ-9) is a self-report measure  that assesses symptoms and severity of depression over the course of the last two weeks. Angel French obtained a score of zero. Depression screen PHQ 2/9 08/17/2018  Decreased Interest 0  Down, Depressed, Hopeless 0  PHQ - 2 Score 0  Altered sleeping 0  Tired, decreased energy 0  Change in appetite 0  Feeling bad or failure about yourself  0  Trouble concentrating 0  Moving slowly or fidgety/restless 0  Suicidal thoughts 0  PHQ-9 Score 0  Difficult doing work/chores -  Some recent data might be hidden   The Generalized Anxiety Disorder-7 (GAD-7) is a brief self-report measure that assesses symptoms of anxiety over the course of the last two weeks. Angel French obtained a score of zero. GAD 7 : Generalized Anxiety Score 08/17/2018  Nervous, Anxious, on Edge 0  Control/stop worrying 0  Worry too much - different things 0  Trouble relaxing 0  Restless 0  Easily annoyed or irritable 0  Afraid - awful might happen 0  Total GAD 7 Score 0  Anxiety Difficulty -   Interventions: Angel French was administered the PHQ-9 and GAD-7 for symptom monitoring. Content from the last session was reviewed. Throughout today's session, empathic reflections and validation were provided.  Angel French was led through the leaves on a stream thought defusion exercise. Today's appointment also focused on termination planning.  DSM-5 Diagnosis: 311 (F32.8) Other Specified Depressive Disorder, Emotional Eating Behaviors  Treatment Goal & Progress: Angel French was seen for an initial appointment with this provider on May 23, 2018 during which the following treatment goal was established: decrease emotional eating. Anayiah has demonstrated progress in her goal of decreasing emotional eating as evidenced by increased awareness of hunger patterns and triggers for emotional eating. During today's appointment, she reported a 75% percent decrease in emotional eating since the onset of treatment  with this provider.  Plan: Evonna continues to appear  able and willing to participate as evidenced by engagement in reciprocal conversation, and asking questions for clarification as appropriate. The next appointment will be scheduled in two weeks. The next session will focus on reviewing learned skills and termination. This provider and Grace will further discuss the option for a referral.

## 2018-08-21 DIAGNOSIS — M0609 Rheumatoid arthritis without rheumatoid factor, multiple sites: Secondary | ICD-10-CM | POA: Diagnosis not present

## 2018-08-22 DIAGNOSIS — M791 Myalgia, unspecified site: Secondary | ICD-10-CM | POA: Diagnosis not present

## 2018-08-22 DIAGNOSIS — M5136 Other intervertebral disc degeneration, lumbar region: Secondary | ICD-10-CM | POA: Diagnosis not present

## 2018-08-22 DIAGNOSIS — Z79899 Other long term (current) drug therapy: Secondary | ICD-10-CM | POA: Diagnosis not present

## 2018-08-22 DIAGNOSIS — E669 Obesity, unspecified: Secondary | ICD-10-CM | POA: Diagnosis not present

## 2018-08-22 DIAGNOSIS — R682 Dry mouth, unspecified: Secondary | ICD-10-CM | POA: Diagnosis not present

## 2018-08-22 DIAGNOSIS — M2550 Pain in unspecified joint: Secondary | ICD-10-CM | POA: Diagnosis not present

## 2018-08-22 DIAGNOSIS — Z6838 Body mass index (BMI) 38.0-38.9, adult: Secondary | ICD-10-CM | POA: Diagnosis not present

## 2018-08-22 DIAGNOSIS — M109 Gout, unspecified: Secondary | ICD-10-CM | POA: Diagnosis not present

## 2018-08-22 DIAGNOSIS — M25531 Pain in right wrist: Secondary | ICD-10-CM | POA: Diagnosis not present

## 2018-08-22 DIAGNOSIS — M0609 Rheumatoid arthritis without rheumatoid factor, multiple sites: Secondary | ICD-10-CM | POA: Diagnosis not present

## 2018-08-22 NOTE — Progress Notes (Signed)
Office: [phone]  /  Fax: [phone]   HPI:   Chief Complaint: OBESITY Angel French is here to discuss her progress with her obesity treatment plan. She is on the keep a food journal with 1200-1300 calories and 85 grams of protein daily and is following her eating plan approximately 80-85 % of the time. She states she is walking for 10 minutes 5 times per week. Isabelly voices she started walking her dogs. She does endorse some emotional eating but did exhibit control. She has 2 events coming up with indulgent eating.  Her weight is 209 lb (94.8 kg) today and has had a weight loss of 1 pound over a period of 2 weeks since her last visit. She has lost 15 lbs since starting treatment with [name].  Diabetes II [name] has a diagnosis of diabetes type II. [name] doesn't has BGs log with her today. When she did check her BGs, she states it was normal. She denies hypoglycemia. Last A1c was 6.0. She has been working on intensive lifestyle modifications including diet, exercise, and weight loss to help control her blood glucose levels.  Depression with emotional eating behaviors [name] is seeing Dr. [name], and has significant improvement in control. [name] struggles with emotional eating and using food for comfort to the extent that it is negatively impacting her health. She often snacks when she is not hungry. [name] sometimes feels she is out of control and then feels guilty that she made poor food choices. She has been working on behavior modification techniques to help reduce her emotional eating and has been somewhat successful. She shows no sign of suicidal or homicidal ideations.  Depression screen [facility] 2/9 08/17/2018 08/03/2018 07/19/2018 07/05/2018 06/20/2018  Decreased Interest 0 0 0 0 1  Down, Depressed, Hopeless 0 0 0 1 0  PHQ - 2 Score 0 0 0 1 1  Altered sleeping 0 0 0 1 0  Tired, decreased energy 0 0 '1 1 1  '$ Change in appetite 0 0 0 1 0  Feeling bad or failure about yourself  0 0 0 0 0  Trouble  concentrating 0 0 0 0 0  Moving slowly or fidgety/restless 0 0 0 0 0  Suicidal thoughts 0 0 0 0 0  PHQ-9 Score 0 0 '1 4 2  '$ Difficult doing work/chores - - - - -  Some recent data might be hidden    ALLERGIES: Allergies  Allergen Reactions  . Codeine Anaphylaxis  . Contrast Media [Iodinated Diagnostic Agents] Anaphylaxis  . Nitrofurantoin Monohyd Macro Anaphylaxis  . Betadine [Povidone Iodine] Itching  . Folic Acid Itching  . Gabapentin Other (See Comments)    Makes patient feel drunk  . Iodine Hives  . Lyrica [Pregabalin] Other (See Comments)    Makes patient feel drunk  . Red Dye Itching  . Ultram [Tramadol Hcl] Nausea And Vomiting    MEDICATIONS: Current Outpatient Medications on File Prior to Visit  Medication Sig Dispense Refill  . acetaminophen (TYLENOL) 325 MG tablet Take 650 mg by mouth every 6 (six) hours as needed (every 6 weeks before RA infusion).    Marland Kitchen allopurinol (ZYLOPRIM) 100 MG tablet Take 1 tablet (100 mg total) by mouth daily. Ov needed (Patient taking differently: Take 200 mg by mouth daily. ) 90 tablet 3  . aspirin 325 MG tablet Take 325 mg by mouth daily.     . Blood Glucose Monitoring Suppl (BLOOD GLUCOSE METER KIT AND SUPPLIES) KIT Dispense based on patient and insurance preference. Use  up to four times daily as directed. (FOR ICD-9 250.00, 250.01). 1 each 11  . Cholecalciferol (VITAMIN D3) 5000 units CAPS Take 1 capsule by mouth daily.    . diclofenac sodium (VOLTAREN) 1 % GEL Apply 2 g topically 4 (four) times daily. (Patient taking differently: Apply 2 g topically 2 (two) times daily as needed (pain). ) 100 g 3  . diphenhydrAMINE (BENADRYL) 25 MG tablet Take 25 mg by mouth every 6 (six) hours as needed (every 6 weeks prior to RA infusion).    . DULoxetine (CYMBALTA) 30 MG capsule TAKE 1 CAPSULE BY MOUTH EVERY DAY 90 capsule 3  . escitalopram (LEXAPRO) 20 MG tablet TAKE 1 TABLET BY MOUTH EVERY DAY 90 tablet 1  . furosemide (LASIX) 20 MG tablet Take 1-3  tablets (20-60 mg total) by mouth daily. 200 tablet 1  . glucose blood test strip Check sugar three times daily  Dx: DMII insulin dependent with retinopathy, neuropathy controlled 300 each 3  . HYDROcodone-acetaminophen (NORCO) 10-325 MG tablet Take 1 tablet by mouth every 12 (twelve) hours as needed. 60 tablet 0  . insulin aspart (NOVOLOG) 100 UNIT/ML injection Inject 25 Units into the skin 3 (three) times daily with meals. 10 mL 11  . Insulin Syringes, Disposable, U-100 0.5 ML MISC 28 Units by Does not apply route 2 (two) times daily. 100 each 11  . leflunomide (ARAVA) 20 MG tablet Take 20 mg by mouth daily.     Marland Kitchen LEVEMIR 100 UNIT/ML injection INJECT 60 UNITS AT BEDTIME AS DIRECTED 20 mL 1  . Needles & Syringes MISC 1 Syringe by Does not apply route 2 (two) times daily. 100 each 11  . omeprazole (PRILOSEC) 20 MG capsule TAKE 1 CAPSULE BY MOUTH EVERY DAY 90 capsule 3  . oxybutynin (DITROPAN XL) 15 MG 24 hr tablet TAKE 1 TABLET BY MOUTH AT BEDTIME 90 tablet 3  . predniSONE (DELTASONE) 5 MG tablet Take 5 mg by mouth daily with breakfast. Only takes with RA Flare.    . rosuvastatin (CRESTOR) 10 MG tablet Take 1 tablet (10 mg total) by mouth daily. 90 tablet 1  . traZODone (DESYREL) 100 MG tablet TAKE 2 TABLETS BY MOUTH EVERY DAY AT BEDTIME 180 tablet 1  . ULTICARE INSULIN SYRINGE 31G X 5/16" 0.5 ML MISC USE AS DIRECTED TO INJECT INSULIN 2 TIMES DAILY 100 each 4   No current facility-administered medications on file prior to visit.     PAST MEDICAL HISTORY: Past Medical History:  Diagnosis Date  . Allergy    generic allergy pill; Spring and Fall only.  . Anxiety   . Arthritis    DDD lumbar, R hip OA.  s/p ortho consult in past.  . Blood transfusion without reported diagnosis    Mountain climbing accident in [place].  . Brachial plexus disorders   . Cataract    B retractions.  . Chronic kidney disease    stage 3 per pt.   . Chronic pain syndrome   . Chronic renal insufficiency, stage 3  (moderate) (HCC)   . Constipation   . DDD (degenerative disc disease), lumbar   . Depression   . Diabetes mellitus   . Diabetic peripheral neuropathy associated with type 2 diabetes mellitus ([place])   . Diabetic retinopathy ([place])   . Diabetic retinopathy associated with type 2 diabetes mellitus ([place])    s/p laser treatment multiple.  Unable to drive.  . Fatty liver   . Fibromyalgia   . Food allergy   .  GERD (gastroesophageal reflux disease)   . Hypercholesteremia   . Hyperlipidemia   . Hypertension    controlled, off meds   . IBS (irritable bowel syndrome)   . Leg edema   . Neuromuscular disorder ([place])   . OSA (obstructive sleep apnea)   . Osteoarthritis   . Rheumatic fever   . Rheumatoid arthritis ([place])   . Stomach ulcer   . Swallowing difficulty   . TIA (transient ischemic attack)   . Ulcer    Peptic ulcer H. Pylori + s/p treatment.  Upper GI diagnosed.[name] Prilosec PRN .    PAST SURGICAL HISTORY: Past Surgical History:  Procedure Laterality Date  .  2 SPINAL INJECTIONS     . ABDOMINAL HYSTERECTOMY  11/02/1979   DUB; cervical dysplasia; ovaries intact.  . ABDOMINAL SURGERY     staph abcess   . [facility] Admission     age 44; three months in [place].  Marland Kitchen BREAST BIOPSY    . CARDIAC CATHETERIZATION  11/02/2007   normal coronary arteries.  . CARPAL TUNNEL RELEASE     Bilateral.  . CATARACT EXTRACTION, BILATERAL    . CHOLECYSTECTOMY    . ESOPHAGEAL MANOMETRY N/A 09/14/2017   Procedure: ESOPHAGEAL MANOMETRY (EM);  Surgeon: Ronnette Juniper, MD;  Location: [facility];  Service: Gastroenterology;  Laterality: N/A;  . EYE SURGERY     Cataracts B. Laser surgery x 7 for Diabetic Retinopathy  . TONSILLECTOMY      SOCIAL HISTORY: Social History   Tobacco Use  . Smoking status: Former Research scientist (life sciences)  . Smokeless tobacco: Never Used  . Tobacco comment: Quit 1987  Substance Use Topics  . Alcohol use: No    Alcohol/week: 0.0 standard drinks  . Drug use: No    FAMILY  HISTORY: Family History  Adopted: Yes  Family history unknown: Yes    ROS: Review of Systems  Constitutional: Positive for weight loss.  Endo/Heme/Allergies:       Negative hypoglycemia  Psychiatric/Behavioral: Positive for depression. Negative for suicidal ideas.    PHYSICAL EXAM: Blood pressure 134/75, pulse 70, temperature 97.6 F (36.4 C), temperature source Oral, height '5\' 2"'$  (1.575 m), weight 209 lb (94.8 kg), SpO2 99 %. Body mass index is 38.23 kg/m. Physical Exam  Constitutional: She is oriented to person, place, and time. She appears well-developed and well-nourished.  Cardiovascular: Normal rate.  Pulmonary/Chest: Effort normal.  Musculoskeletal: Normal range of motion.  Neurological: She is oriented to person, place, and time.  Skin: Skin is warm and dry.  Psychiatric: She has a normal mood and affect. Her behavior is normal.  Vitals reviewed.   RECENT LABS AND TESTS: BMET    Component Value Date/Time   NA 141 08/03/2018 1209   K 3.9 08/03/2018 1209   CL 96 08/03/2018 1209   CO2 29 08/03/2018 1209   GLUCOSE 97 08/03/2018 1209   GLUCOSE 58 (L) 09/08/2016 1035   BUN 38 (H) 08/03/2018 1209   CREATININE 1.35 (H) 08/03/2018 1209   CREATININE 1.30 (H) 09/08/2016 1035   CALCIUM 9.9 08/03/2018 1209   GFRNONAA 41 (L) 08/03/2018 1209   GFRNONAA 36 (L) 07/10/2014 1354   GFRAA 47 (L) 08/03/2018 1209   GFRAA 41 (L) 07/10/2014 1354   Lab Results  Component Value Date   HGBA1C 6.0 (H) 08/03/2018   HGBA1C 8.1 (H) 03/07/2018   HGBA1C 11.0 01/09/2018   HGBA1C 8.0 10/07/2017   HGBA1C 7.0 06/28/2017   Lab Results  Component Value Date  INSULIN 6.2 03/07/2018   CBC    Component Value Date/Time   WBC 5.1 01/30/2018 1000   WBC 5.7 09/08/2016 1035   RBC 3.38 (L) 01/30/2018 1000   RBC 3.82 09/08/2016 1035   HGB 10.9 (L) 01/30/2018 1000   HCT 30.8 (L) 01/30/2018 1000   PLT 226 01/30/2018 1000   MCV 91 01/30/2018 1000   MCH 32.2 01/30/2018 1000   MCH 29.8  09/08/2016 1035   MCHC 35.4 01/30/2018 1000   MCHC 32.5 09/08/2016 1035   RDW 14.8 01/30/2018 1000   LYMPHSABS 1.7 01/30/2018 1000   MONOABS 513 09/08/2016 1035   EOSABS 0.3 01/30/2018 1000   BASOSABS 0.0 01/30/2018 1000   Iron/TIBC/Ferritin/ %Sat    Component Value Date/Time   IRON 79 02/03/2016 1015   TIBC 309 02/03/2016 1015   IRONPCTSAT 26 02/03/2016 1015   Lipid Panel     Component Value Date/Time   CHOL 124 08/03/2018 1209   TRIG 204 (H) 08/03/2018 1209   HDL 32 (L) 08/03/2018 1209   CHOLHDL 3.9 08/03/2018 1209   CHOLHDL 5.3 (H) 09/08/2016 1035   VLDL 46 (H) 09/08/2016 1035   LDLCALC 51 08/03/2018 1209   Hepatic Function Panel     Component Value Date/Time   PROT 7.3 08/03/2018 1209   ALBUMIN 4.0 08/03/2018 1209   AST 24 08/03/2018 1209   ALT 16 08/03/2018 1209   ALKPHOS 98 08/03/2018 1209   BILITOT 0.3 08/03/2018 1209      Component Value Date/Time   TSH 0.958 03/07/2018 1310   TSH 0.874 01/30/2018 1000   TSH 1.060 03/23/2017 1230    ASSESSMENT AND PLAN: Type 2 diabetes mellitus without complication, with long-term current use of insulin (HCC)  Other depression - with emotional eating  Class 2 severe obesity with serious comorbidity and body mass index (BMI) of 38.0 to 38.9 in adult, unspecified obesity type ([place])  PLAN:  Diabetes II Kayse has been given extensive diabetes education by myself today including ideal fasting and post-prandial blood glucose readings, individual ideal Hgb A1c goals and hypoglycemia prevention. We discussed the importance of good blood sugar control to decrease the likelihood of diabetic complications such as nephropathy, neuropathy, limb loss, blindness, coronary artery disease, and death. We discussed the importance of intensive lifestyle modification including diet, exercise and weight loss as the first line treatment for diabetes. Danisa agrees to continue her current diabetes medications and we will discuss BGs at next  appointment. Shaneya agrees to follow up with our clinic in 2 weeks.  Depression with Emotional Eating Behaviors We discussed behavior modification techniques today to help Jonise deal with her emotional eating and depression. Fatumata is to follow up with 1 more appointment with Dr. [name], and she agrees to follow up with our clinic in 2 weeks.  I spent > than 50% of the 15 minute visit on counseling as documented in the note.  Obesity Yaminah is currently in the action stage of change. As such, her goal is to continue with weight loss efforts She has agreed to keep a food journal with 1200-1300 calories and 85+ grams of protein daily Jenyfer has been instructed to work up to a goal of 150 minutes of combined cardio and strengthening exercise per week for weight loss and overall health benefits. We discussed the following Behavioral Modification Strategies today: increasing lean protein intake, increasing vegetables, work on meal planning and easy cooking plans, planning for success, and keep a strict food journal   Tyia has  agreed to follow up with our clinic in 2 weeks. She was informed of the importance of frequent follow up visits to maximize her success with intensive lifestyle modifications for her multiple health conditions.   OBESITY BEHAVIORAL INTERVENTION VISIT  Today's visit was # 10   Starting weight: 224 lbs Starting date: 03/07/18 Today's weight : 209 lbs Today's date: 08/17/2018 Total lbs lost to date: 15    ASK: We discussed the diagnosis of obesity with Seidy S Kinton today and Yuli agreed to give [name] permission to discuss obesity behavioral modification therapy today.  ASSESS: Ioanna has the diagnosis of obesity and her BMI today is 38.22 Suzie is in the action stage of change   ADVISE: Kalyssa was educated on the multiple health risks of obesity as well as the benefit of weight loss to improve her health. She was advised of the need for long term treatment and  the importance of lifestyle modifications to improve her current health and to decrease her risk of future health problems.  AGREE: Multiple dietary modification options and treatment options were discussed and  Jory agreed to follow the recommendations documented in the above note.  ARRANGE: Cherylanne was educated on the importance of frequent visits to treat obesity as outlined per CMS and USPSTF guidelines and agreed to schedule her next follow up appointment today.  I, Trixie Dredge, am acting as transcriptionist for Osa Craver, MD  I have reviewed the above documentation for accuracy and completeness, and I agree with the above. - Ilene Qua, MD

## 2018-08-29 NOTE — Progress Notes (Signed)
Office: [phone]  /  Fax: [phone]   Date: September 05, 2018 Time Seen: 8:32am Duration: 28 minutes Provider: Glennie French, Psy.D. Type of Session: Individual Therapy   HPI: Randiewas referred by Dr. Ilene French due todepression with emotional eating behaviors, andwas seen for an initial French with this provider on May 23, 2018.Per the note for a visit with Dr. Adair French on May 01, 2018, "Randiehad lots of emotional eating after the roof collapse. She has struggled with emotional eating for years. She struggleswith emotional eating and using food for comfort to the extent that it is negatively impacting herhealth. Sheoften snacks when sheis not hungry. Randiesometimes feels sheis out of control and then feels guilty that shemade poor food choices. Angel French been working on behavior modification techniques to help reduce heremotional eating and has been somewhat successful.Sheshows no sign of suicidal or homicidal ideations."Per the note for the initial visit with Dr. Adair French on Mar 07, 2018, Randiehas been heavy most of herlifeand shestarted gaining weight over the last 12 yrs or so. 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 Angel French stated her daughter and granddaughter reside with her, and "There is always stress." She described the stress as "permanent" and added, "I see no way around it." Angel French elaborated she plays a vital role in her granddaughter's upbringing and her granddaughters suffers from "severe seizures." Angel French stated she uses food for comfort when experiencing stress; however, she described increased awareness of her eating choices.Furthermore,Angel French reported "major stresses" starting in childhood, as her brother was "moderately retarded" and  "violent." After her parents passed away, Angel French shared she was his guardian, but later asked the Court to take away the guardianship.Randiewas asked to complete a questionnaire assessing various behaviors related to emotional eating. Randieendorsed the following: overeat when you are celebrating; eat certain foods when you are anxious, stressed, depressed, or your feelings are hurt; use food to help you cope with emotional situations; find food is comforting to you; and overeat when you are worried about something.  Session Content: Session focused on the following treatment goal: decrease emotional eating. The session was initiated with the administration of the PHQ-9 and GAD-7, as well as a brief check-in. Angel French reported she has been "very sick" due to viruses and allergies. Since being sick,  she has not been following her prescribed meal plan. She disclosed consuming ice cream, sherbet, and chicken noodle soup. During her last French with Dr. Adair French, Angel French reported she lost an additional pound; however, she denied experiencing concerns related to the previous trauma she endured when she was at this weight. She reported she has friends and family for support and she added, "I know what I am doing and I think I am doing pretty well." Angel French focused on termination and reviewing learned skills. A handout with an additional mindfulness exercise was provided to Angel French and the option of utilizing YouTube for mindfulness videos was discussed. Angel French further indicated that she continues to practice thought defusion as well. Furthermore, she reported a "more than 75% decrease in emotional eating" since onset of treatment with this provider. Due to Angel French's concern of the upcoming holidays as well as the anniversary of her husband's passing, this provider recommended a referral for longer-term therapeutic services; however, Angel French declined at this time. Nevertheless she indicated  that should she  feel the need for services, she will inform Dr. Adair French at future appointments and request a referral. Session concluded with Angel French requesting additional copies of the handout discussing triggers for emotional eating as she indicated she utilized her original copy frequently. Overall, Angel French was receptive to today's French as evidenced by her openness to sharing and responsiveness to feedback  Mental Status Examination: Angel French arrived on time for the French; however, the French was initiated late due to this provider. She presented as appropriately dressed and groomed. Angel French appeared her stated age and demonstrated adequate orientation to time, place, person, and purpose of the French. She also demonstrated appropriate eye contact. No psychomotor abnormalities or behavioral peculiarities noted. Her mood was euthymic with congruent affect. Her thought processes were logical, linear, and goal-directed. No hallucinations, delusions, bizarre thinking or behavior reported or observed. Judgment, insight, and impulse control appeared to be grossly intact. There was no evidence of paraphasias (i.e., errors in speech, gross mispronunciations, and word substitutions), repetition deficits, or disturbances in volume or prosody (i.e., rhythm and intonation). There was no evidence of attention or memory impairments. Angel French denied current suicidal and homicidal ideation, intent or plan.  Structured Assessment Results: The Patient Health Questionnaire-9 (PHQ-9) is a self-report measure that assesses symptoms and severity of depression over the course of the last two weeks. Angel French obtained a score of zero. Depression screen PHQ 2/9 09/05/2018  Decreased Interest 0  Down, Depressed, Hopeless 0  PHQ - 2 Score 0  Altered sleeping 0  Tired, decreased energy 0  Change in appetite 0  Feeling bad or failure about yourself  0  Trouble concentrating 0  Moving slowly or fidgety/restless 0    Suicidal thoughts 0  PHQ-9 Score 0  Difficult doing work/chores -  Some recent data might be hidden   The Generalized Anxiety Disorder-7 (GAD-7) is a brief self-report measure that assesses symptoms of anxiety over the course of the last two weeks. Angel French obtained a score of zero. GAD 7 : Generalized Anxiety Score 09/05/2018  Nervous, Anxious, on Edge 0  Control/stop worrying 0  Worry too much - different things 0  Trouble relaxing 0  Restless 0  Easily annoyed or irritable 0  Afraid - awful might happen 0  Total GAD 7 Score 0  Anxiety Difficulty -   Interventions: Angel French was administered the PHQ-9 and GAD-7 for symptom monitoring. Content from the last session was reviewed. Throughout today's session, empathic reflections and validation were provided.  Today's French focused on termination and reviewing learned skills. In addition, Angel French was provided a handout for an additional mindfulness exercises and the option to utilize YouTube was discussed.   DSM-5 Diagnosis: 311 (F32.8) Other Specified Depressive Disorder, Emotional Eating Behaviors  Treatment Goal & Progress: Angel French was seen for an initial French with this provider on May 23, 2018 during which the following treatment goal was established: decrease emotional eating. Angel French has demonstrated progress in her goal of decreasing emotional eating as evidenced by her increased awareness of hunger patterns and triggers for emotional eating. In addition, she discussed utilization of learned skills. Overall, Angel French indicated a "more than 75% decrease in emotional eating" since onset of treatment with this provider.  Plan: Today was Oaklee's last French with this provider. The option of a referral for longer-term therapeutic services was discussed; however, she declined at this time. Nonetheless, she was receptive to requesting a referral from Dr. Adair French in the future should she feel it is needed.

## 2018-09-05 ENCOUNTER — Ambulatory Visit (INDEPENDENT_AMBULATORY_CARE_PROVIDER_SITE_OTHER): Payer: Medicare Other | Admitting: Family Medicine

## 2018-09-05 ENCOUNTER — Ambulatory Visit (INDEPENDENT_AMBULATORY_CARE_PROVIDER_SITE_OTHER): Payer: Medicare Other | Admitting: Psychology

## 2018-09-05 VITALS — BP 117/75 | HR 95 | Temp 98.0°F | Ht 62.0 in | Wt 206.0 lb

## 2018-09-05 DIAGNOSIS — Z6837 Body mass index (BMI) 37.0-37.9, adult: Secondary | ICD-10-CM

## 2018-09-05 DIAGNOSIS — F3289 Other specified depressive episodes: Secondary | ICD-10-CM | POA: Diagnosis not present

## 2018-09-05 DIAGNOSIS — E119 Type 2 diabetes mellitus without complications: Secondary | ICD-10-CM

## 2018-09-05 DIAGNOSIS — I6523 Occlusion and stenosis of bilateral carotid arteries: Secondary | ICD-10-CM

## 2018-09-05 NOTE — Progress Notes (Signed)
Office: [phone]  /  Fax: [phone]   HPI:   Chief Complaint: OBESITY Angel French is here to discuss her progress with her obesity treatment plan. She is keeping a food journal with 1200 to 1300 calories and 85+ grams of protein and is following her eating plan approximately 0 % of the time. She states she is exercising 0 minutes 0 times per week. [name] has not felt well for a majority of the time between visits. She hasn't eaten much. She is starting to feel better and she was eating more ice cream and sherbet secondary to a sore throat.  Her weight is 206 lb (93.4 kg) today and has had a weight loss of 3 pounds over a period of 3 weeks since her last visit. She has lost 18 lbs since starting treatment with [name].  Diabetes II Angel French has a diagnosis of diabetes type II. Angel French's blood sugars are unknown as she hasn't taken her blood sugars and she denies any hypoglycemic episodes. Last A1c was 6.0 on 08/03/18. She is getting her flu shot on November 15th.  Depression with emotional eating behaviors Angel French is struggling with emotional eating and using food for comfort to the extent that it is negatively impacting her health. She sees Dr. [name], but is done with 6 sessions. She has not had any emotional eating episodes in the past 2 weeks.  ALLERGIES: Allergies  Allergen Reactions  . Codeine Anaphylaxis  . Contrast Media [Iodinated Diagnostic Agents] Anaphylaxis  . Nitrofurantoin Monohyd Macro Anaphylaxis  . Betadine [Povidone Iodine] Itching  . Folic Acid Itching  . Gabapentin Other (See Comments)    Makes patient feel drunk  . Iodine Hives  . Lyrica [Pregabalin] Other (See Comments)    Makes patient feel drunk  . Red Dye Itching  . Ultram [Tramadol Hcl] Nausea And Vomiting    MEDICATIONS: Current Outpatient Medications on File Prior to Visit  Medication Sig Dispense Refill  . acetaminophen (TYLENOL) 325 MG tablet Take 650 mg by mouth every 6 (six) hours as needed (every 6  weeks before RA infusion).    Marland Kitchen allopurinol (ZYLOPRIM) 100 MG tablet Take 1 tablet (100 mg total) by mouth daily. Ov needed (Patient taking differently: Take 200 mg by mouth daily. ) 90 tablet 3  . aspirin 325 MG tablet Take 325 mg by mouth daily.     . Blood Glucose Monitoring Suppl (BLOOD GLUCOSE METER KIT AND SUPPLIES) KIT Dispense based on patient and insurance preference. Use up to four times daily as directed. (FOR ICD-9 250.00, 250.01). 1 each 11  . Cholecalciferol (VITAMIN D3) 5000 units CAPS Take 1 capsule by mouth daily.    . diclofenac sodium (VOLTAREN) 1 % GEL Apply 2 g topically 4 (four) times daily. (Patient taking differently: Apply 2 g topically 2 (two) times daily as needed (pain). ) 100 g 3  . diphenhydrAMINE (BENADRYL) 25 MG tablet Take 25 mg by mouth every 6 (six) hours as needed (every 6 weeks prior to RA infusion).    . DULoxetine (CYMBALTA) 30 MG capsule TAKE 1 CAPSULE BY MOUTH EVERY DAY 90 capsule 3  . escitalopram (LEXAPRO) 20 MG tablet TAKE 1 TABLET BY MOUTH EVERY DAY 90 tablet 1  . furosemide (LASIX) 20 MG tablet Take 1-3 tablets (20-60 mg total) by mouth daily. 200 tablet 1  . glucose blood test strip Check sugar three times daily  Dx: DMII insulin dependent with retinopathy, neuropathy controlled 300 each 3  . HYDROcodone-acetaminophen (NORCO) 10-325 MG tablet  Take 1 tablet by mouth every 12 (twelve) hours as needed. 60 tablet 0  . insulin aspart (NOVOLOG) 100 UNIT/ML injection Inject 25 Units into the skin 3 (three) times daily with meals. 10 mL 11  . Insulin Syringes, Disposable, U-100 0.5 ML MISC 28 Units by Does not apply route 2 (two) times daily. 100 each 11  . leflunomide (ARAVA) 20 MG tablet Take 20 mg by mouth daily.     Marland Kitchen LEVEMIR 100 UNIT/ML injection INJECT 60 UNITS AT BEDTIME AS DIRECTED 20 mL 1  . Needles & Syringes MISC 1 Syringe by Does not apply route 2 (two) times daily. 100 each 11  . omeprazole (PRILOSEC) 20 MG capsule TAKE 1 CAPSULE BY MOUTH EVERY DAY  90 capsule 3  . oxybutynin (DITROPAN XL) 15 MG 24 hr tablet TAKE 1 TABLET BY MOUTH AT BEDTIME 90 tablet 3  . predniSONE (DELTASONE) 5 MG tablet Take 5 mg by mouth daily with breakfast. Only takes with RA Flare.    . rosuvastatin (CRESTOR) 10 MG tablet Take 1 tablet (10 mg total) by mouth daily. 90 tablet 1  . traZODone (DESYREL) 100 MG tablet TAKE 2 TABLETS BY MOUTH EVERY DAY AT BEDTIME 180 tablet 1  . ULTICARE INSULIN SYRINGE 31G X 5/16" 0.5 ML MISC USE AS DIRECTED TO INJECT INSULIN 2 TIMES DAILY 100 each 4   No current facility-administered medications on file prior to visit.     PAST MEDICAL HISTORY: Past Medical History:  Diagnosis Date  . Allergy    generic allergy pill; Spring and Fall only.  . Anxiety   . Arthritis    DDD lumbar, R hip OA.  s/p ortho consult in past.  . Blood transfusion without reported diagnosis    Mountain climbing accident in [place].  . Brachial plexus disorders   . Cataract    B retractions.  . Chronic kidney disease    stage 3 per pt.   . Chronic pain syndrome   . Chronic renal insufficiency, stage 3 (moderate) (HCC)   . Constipation   . DDD (degenerative disc disease), lumbar   . Depression   . Diabetes mellitus   . Diabetic peripheral neuropathy associated with type 2 diabetes mellitus ([place])   . Diabetic retinopathy ([place])   . Diabetic retinopathy associated with type 2 diabetes mellitus ([place])    s/p laser treatment multiple.  Unable to drive.  . Fatty liver   . Fibromyalgia   . Food allergy   . GERD (gastroesophageal reflux disease)   . Hypercholesteremia   . Hyperlipidemia   . Hypertension    controlled, off meds   . IBS (irritable bowel syndrome)   . Leg edema   . Neuromuscular disorder ([place])   . OSA (obstructive sleep apnea)   . Osteoarthritis   . Rheumatic fever   . Rheumatoid arthritis ([place])   . Stomach ulcer   . Swallowing difficulty   . TIA (transient ischemic attack)   . Ulcer    Peptic ulcer H. Pylori + s/p treatment.   Upper GI diagnosed.[name] Prilosec PRN .    PAST SURGICAL HISTORY: Past Surgical History:  Procedure Laterality Date  .  2 SPINAL INJECTIONS     . ABDOMINAL HYSTERECTOMY  11/02/1979   DUB; cervical dysplasia; ovaries intact.  . ABDOMINAL SURGERY     staph abcess   . [facility] Admission     age 69; three months in [place].  Marland Kitchen BREAST BIOPSY    .  CARDIAC CATHETERIZATION  11/02/2007   normal coronary arteries.  . CARPAL TUNNEL RELEASE     Bilateral.  . CATARACT EXTRACTION, BILATERAL    . CHOLECYSTECTOMY    . ESOPHAGEAL MANOMETRY N/A 09/14/2017   Procedure: ESOPHAGEAL MANOMETRY (EM);  Surgeon: Ronnette Juniper, MD;  Location: [facility];  Service: Gastroenterology;  Laterality: N/A;  . EYE SURGERY     Cataracts B. Laser surgery x 7 for Diabetic Retinopathy  . TONSILLECTOMY      SOCIAL HISTORY: Social History   Tobacco Use  . Smoking status: Former Research scientist (life sciences)  . Smokeless tobacco: Never Used  . Tobacco comment: Quit 1987  Substance Use Topics  . Alcohol use: No    Alcohol/week: 0.0 standard drinks  . Drug use: No    FAMILY HISTORY: Family History  Adopted: Yes  Family history unknown: Yes    ROS: Review of Systems  Constitutional: Positive for weight loss.  Endo/Heme/Allergies:       Negative for hypoglycemia.    PHYSICAL EXAM: Blood pressure 117/75, pulse 95, temperature 98 F (36.7 C), temperature source Oral, height '5\' 2"'$  (1.575 m), weight 206 lb (93.4 kg), SpO2 96 %. Body mass index is 37.68 kg/m. Physical Exam  Constitutional: She is oriented to person, place, and time. She appears well-developed and well-nourished.  Cardiovascular: Normal rate.  Pulmonary/Chest: Effort normal.  Musculoskeletal: Normal range of motion.  Neurological: She is oriented to person, place, and time.  Skin: Skin is warm and dry.  Psychiatric: She has a normal mood and affect. Her behavior is normal.  Vitals reviewed.   RECENT LABS AND TESTS: BMET    Component Value  Date/Time   NA 141 08/03/2018 1209   K 3.9 08/03/2018 1209   CL 96 08/03/2018 1209   CO2 29 08/03/2018 1209   GLUCOSE 97 08/03/2018 1209   GLUCOSE 58 (L) 09/08/2016 1035   BUN 38 (H) 08/03/2018 1209   CREATININE 1.35 (H) 08/03/2018 1209   CREATININE 1.30 (H) 09/08/2016 1035   CALCIUM 9.9 08/03/2018 1209   GFRNONAA 41 (L) 08/03/2018 1209   GFRNONAA 36 (L) 07/10/2014 1354   GFRAA 47 (L) 08/03/2018 1209   GFRAA 41 (L) 07/10/2014 1354   Lab Results  Component Value Date   HGBA1C 6.0 (H) 08/03/2018   HGBA1C 8.1 (H) 03/07/2018   HGBA1C 11.0 01/09/2018   HGBA1C 8.0 10/07/2017   HGBA1C 7.0 06/28/2017   Lab Results  Component Value Date   INSULIN 6.2 03/07/2018   CBC    Component Value Date/Time   WBC 5.1 01/30/2018 1000   WBC 5.7 09/08/2016 1035   RBC 3.38 (L) 01/30/2018 1000   RBC 3.82 09/08/2016 1035   HGB 10.9 (L) 01/30/2018 1000   HCT 30.8 (L) 01/30/2018 1000   PLT 226 01/30/2018 1000   MCV 91 01/30/2018 1000   MCH 32.2 01/30/2018 1000   MCH 29.8 09/08/2016 1035   MCHC 35.4 01/30/2018 1000   MCHC 32.5 09/08/2016 1035   RDW 14.8 01/30/2018 1000   LYMPHSABS 1.7 01/30/2018 1000   MONOABS 513 09/08/2016 1035   EOSABS 0.3 01/30/2018 1000   BASOSABS 0.0 01/30/2018 1000   Iron/TIBC/Ferritin/ %Sat    Component Value Date/Time   IRON 79 02/03/2016 1015   TIBC 309 02/03/2016 1015   IRONPCTSAT 26 02/03/2016 1015   Lipid Panel     Component Value Date/Time   CHOL 124 08/03/2018 1209   TRIG 204 (H) 08/03/2018 1209   HDL 32 (L) 08/03/2018 1209   CHOLHDL  3.9 08/03/2018 1209   CHOLHDL 5.3 (H) 09/08/2016 1035   VLDL 46 (H) 09/08/2016 1035   LDLCALC 51 08/03/2018 1209   Hepatic Function Panel     Component Value Date/Time   PROT 7.3 08/03/2018 1209   ALBUMIN 4.0 08/03/2018 1209   AST 24 08/03/2018 1209   ALT 16 08/03/2018 1209   ALKPHOS 98 08/03/2018 1209   BILITOT 0.3 08/03/2018 1209      Component Value Date/Time   TSH 0.958 03/07/2018 1310   TSH 0.874  01/30/2018 1000   TSH 1.060 03/23/2017 1230   Results for SABRIAH, HOBBINS (MRN [id number]) as of 09/05/2018 14:09  Ref. Range 08/03/2018 12:09  Vitamin D, 25-Hydroxy Latest Ref Range: 30.0 - 100.0 ng/mL 59.0   ASSESSMENT AND PLAN: Type 2 diabetes mellitus without complication, without long-term current use of insulin (HCC)  Other depression - with emotional eating  Class 2 severe obesity with serious comorbidity and body mass index (BMI) of 37.0 to 37.9 in adult, unspecified obesity type ([place])  PLAN:  Diabetes II Julie-Anne has been given extensive diabetes education by myself today including ideal fasting and post-prandial blood glucose readings, individual ideal Hgb A1c goals, and hypoglycemia prevention. We discussed the importance of good blood sugar control to decrease the likelihood of diabetic complications such as nephropathy, neuropathy, limb loss, blindness, coronary artery disease, and death. We discussed the importance of intensive lifestyle modification including diet, exercise and weight loss as the first line treatment for diabetes. Aloise agrees to resume checking her blood sugars and to continue her diabetes medications. She agrees that she will follow up at the agreed upon time in 2 weeks.  Depression with Emotional Eating Behaviors We discussed behavior modification techniques today to help Shakeerah deal with her emotional eating and depression. We will follow up at her next appointment.   I spent > than 50% of the 15 minute visit on counseling as documented in the note.  Obesity Jalaiyah is currently in the action stage of change. As such, her goal is to continue with weight loss efforts. She has agreed to keep a food journal with 1200 to 1300 calories and 85+ grams of protein. She is going grocery shopping tonight. Keina has been instructed to work up to a goal of 150 minutes of combined cardio and strengthening exercise per week for weight loss and overall health  benefits. We discussed the following Behavioral Modification Strategies today: increasing lean protein intake, increasing vegetables, work on meal planning and easy cooking plans, and planning for success.  [place] has agreed to follow up with our clinic in 2 weeks. She was informed of the importance of frequent follow up visits to maximize her success with intensive lifestyle modifications for her multiple health conditions.   OBESITY BEHAVIORAL INTERVENTION VISIT  Today's visit was # 11   Starting weight: 224 lbs Starting date: 03/07/18 Today's weight : Weight: 206 lb (93.4 kg)  Today's date: 09/05/2018 Total lbs lost to date: 38  ASK: We discussed the diagnosis of obesity with Ritha S Kinton today and Ryenne agreed to give [name] permission to discuss obesity behavioral modification therapy today.  ASSESS: Arthelia has the diagnosis of obesity and her BMI today is 37.67. Elyanah is in the action stage of change.   ADVISE: Shelie was educated on the multiple health risks of obesity as well as the benefit of weight loss to improve her health. She was advised of the need for long term treatment and the importance of lifestyle  modifications to improve her current health and to decrease her risk of future health problems.  AGREE: Multiple dietary modification options and treatment options were discussed and Lether agreed to follow the recommendations documented in the above note.  ARRANGE: Shabreka was educated on the importance of frequent visits to treat obesity as outlined per CMS and USPSTF guidelines and agreed to schedule her next follow up appointment today.  I, Marcille Blanco, am acting as Location manager for Eber Jones, MD  I have reviewed the above documentation for accuracy and completeness, and I agree with the above. - Ilene Qua, MD

## 2018-09-12 DIAGNOSIS — N183 Chronic kidney disease, stage 3 (moderate): Secondary | ICD-10-CM | POA: Diagnosis not present

## 2018-09-15 DIAGNOSIS — M7989 Other specified soft tissue disorders: Secondary | ICD-10-CM | POA: Diagnosis not present

## 2018-09-15 DIAGNOSIS — E1121 Type 2 diabetes mellitus with diabetic nephropathy: Secondary | ICD-10-CM | POA: Diagnosis not present

## 2018-09-15 DIAGNOSIS — Z794 Long term (current) use of insulin: Secondary | ICD-10-CM | POA: Diagnosis not present

## 2018-09-15 DIAGNOSIS — M25531 Pain in right wrist: Secondary | ICD-10-CM | POA: Diagnosis not present

## 2018-09-15 DIAGNOSIS — M79644 Pain in right finger(s): Secondary | ICD-10-CM | POA: Diagnosis not present

## 2018-09-21 ENCOUNTER — Ambulatory Visit (INDEPENDENT_AMBULATORY_CARE_PROVIDER_SITE_OTHER): Payer: Medicare Other | Admitting: Family Medicine

## 2018-09-21 VITALS — BP 121/62 | HR 96 | Temp 97.9°F | Ht 62.0 in | Wt 208.0 lb

## 2018-09-21 DIAGNOSIS — I6523 Occlusion and stenosis of bilateral carotid arteries: Secondary | ICD-10-CM | POA: Diagnosis not present

## 2018-09-21 DIAGNOSIS — E559 Vitamin D deficiency, unspecified: Secondary | ICD-10-CM | POA: Diagnosis not present

## 2018-09-21 DIAGNOSIS — Z6838 Body mass index (BMI) 38.0-38.9, adult: Secondary | ICD-10-CM

## 2018-09-21 DIAGNOSIS — E119 Type 2 diabetes mellitus without complications: Secondary | ICD-10-CM

## 2018-09-27 NOTE — Progress Notes (Signed)
Office: [phone]  /  Fax: [phone]   HPI:   Chief Complaint: OBESITY Angel French is here to discuss her progress with her obesity treatment plan. She is keeping a food journal with 1200 to 1300 calories and 85 grams of protein and is following her eating plan approximately 70 % of the time. She states she is exercising 0 minutes 0 times per week. Angel French had a recent fall and hit her Right wrist on the foot board and has tendonitis, so she is wearing a brace. She has a 66 year old relative that is dying. She didn't overindulge in less healthy options, she just overate healthy options.  Her weight is 208 lb (94.3 kg) today and has had a weight gain of 2 pounds over a period of 2 weeks since her last visit. She has lost 16 lbs since starting treatment with [name].  Diabetes II Angel French has a diagnosis of diabetes type II. Angel French states that her fasting blood sugars are approximately 120 except for 1 day it was 136. Her blood sugars are not significantly increased even with overeating. Last A1c was 6.0 on 08/03/18. She denies hypoglycemic episodes. She has been working on intensive lifestyle modifications including diet, exercise, and weight loss to help control her blood glucose levels.  Vitamin D deficiency Angel French has a diagnosis of vitamin D deficiency. She is currently taking OTC vit D 5,000 units. She admits fatigue is improving and denies nausea, vomiting, or muscle weakness.  ALLERGIES: Allergies  Allergen Reactions  . Codeine Anaphylaxis  . Contrast Media [Iodinated Diagnostic Agents] Anaphylaxis  . Nitrofurantoin Monohyd Macro Anaphylaxis  . Betadine [Povidone Iodine] Itching  . Folic Acid Itching  . Gabapentin Other (See Comments)    Makes patient feel drunk  . Iodine Hives  . Lyrica [Pregabalin] Other (See Comments)    Makes patient feel drunk  . Red Dye Itching  . Ultram [Tramadol Hcl] Nausea And Vomiting    MEDICATIONS: Current Outpatient Medications on File Prior to Visit    Medication Sig Dispense Refill  . acetaminophen (TYLENOL) 325 MG tablet Take 650 mg by mouth every 6 (six) hours as needed (every 6 weeks before RA infusion).    Marland Kitchen allopurinol (ZYLOPRIM) 100 MG tablet Take 1 tablet (100 mg total) by mouth daily. Ov needed (Patient taking differently: Take 200 mg by mouth daily. ) 90 tablet 3  . aspirin 325 MG tablet Take 325 mg by mouth daily.     . Blood Glucose Monitoring Suppl (BLOOD GLUCOSE METER KIT AND SUPPLIES) KIT Dispense based on patient and insurance preference. Use up to four times daily as directed. (FOR ICD-9 250.00, 250.01). 1 each 11  . Cholecalciferol (VITAMIN D3) 5000 units CAPS Take 1 capsule by mouth daily.    . diclofenac sodium (VOLTAREN) 1 % GEL Apply 2 g topically 4 (four) times daily. (Patient taking differently: Apply 2 g topically 2 (two) times daily as needed (pain). ) 100 g 3  . diphenhydrAMINE (BENADRYL) 25 MG tablet Take 25 mg by mouth every 6 (six) hours as needed (every 6 weeks prior to RA infusion).    . DULoxetine (CYMBALTA) 30 MG capsule TAKE 1 CAPSULE BY MOUTH EVERY DAY 90 capsule 3  . escitalopram (LEXAPRO) 20 MG tablet TAKE 1 TABLET BY MOUTH EVERY DAY 90 tablet 1  . furosemide (LASIX) 20 MG tablet Take 1-3 tablets (20-60 mg total) by mouth daily. 200 tablet 1  . glucose blood test strip Check sugar three times daily  Dx:  DMII insulin dependent with retinopathy, neuropathy controlled 300 each 3  . HYDROcodone-acetaminophen (NORCO) 10-325 MG tablet Take 1 tablet by mouth every 12 (twelve) hours as needed. 60 tablet 0  . insulin aspart (NOVOLOG) 100 UNIT/ML injection Inject 25 Units into the skin 3 (three) times daily with meals. 10 mL 11  . Insulin Syringes, Disposable, U-100 0.5 ML MISC 28 Units by Does not apply route 2 (two) times daily. 100 each 11  . leflunomide (ARAVA) 20 MG tablet Take 20 mg by mouth daily.     Marland Kitchen LEVEMIR 100 UNIT/ML injection INJECT 60 UNITS AT BEDTIME AS DIRECTED 20 mL 1  . Needles & Syringes MISC 1  Syringe by Does not apply route 2 (two) times daily. 100 each 11  . omeprazole (PRILOSEC) 20 MG capsule TAKE 1 CAPSULE BY MOUTH EVERY DAY 90 capsule 3  . oxybutynin (DITROPAN XL) 15 MG 24 hr tablet TAKE 1 TABLET BY MOUTH AT BEDTIME 90 tablet 3  . predniSONE (DELTASONE) 5 MG tablet Take 5 mg by mouth daily with breakfast. Only takes with RA Flare.    . rosuvastatin (CRESTOR) 10 MG tablet Take 1 tablet (10 mg total) by mouth daily. 90 tablet 1  . traZODone (DESYREL) 100 MG tablet TAKE 2 TABLETS BY MOUTH EVERY DAY AT BEDTIME 180 tablet 1  . ULTICARE INSULIN SYRINGE 31G X 5/16" 0.5 ML MISC USE AS DIRECTED TO INJECT INSULIN 2 TIMES DAILY 100 each 4   No current facility-administered medications on file prior to visit.     PAST MEDICAL HISTORY: Past Medical History:  Diagnosis Date  . Allergy    generic allergy pill; Spring and Fall only.  . Anxiety   . Arthritis    DDD lumbar, R hip OA.  s/p ortho consult in past.  . Blood transfusion without reported diagnosis    Mountain climbing accident in [place].  . Brachial plexus disorders   . Cataract    B retractions.  . Chronic kidney disease    stage 3 per pt.   . Chronic pain syndrome   . Chronic renal insufficiency, stage 3 (moderate) (HCC)   . Constipation   . DDD (degenerative disc disease), lumbar   . Depression   . Diabetes mellitus   . Diabetic peripheral neuropathy associated with type 2 diabetes mellitus ([place])   . Diabetic retinopathy ([place])   . Diabetic retinopathy associated with type 2 diabetes mellitus ([place])    s/p laser treatment multiple.  Unable to drive.  . Fatty liver   . Fibromyalgia   . Food allergy   . GERD (gastroesophageal reflux disease)   . Hypercholesteremia   . Hyperlipidemia   . Hypertension    controlled, off meds   . IBS (irritable bowel syndrome)   . Leg edema   . Neuromuscular disorder ([place])   . OSA (obstructive sleep apnea)   . Osteoarthritis   . Rheumatic fever   . Rheumatoid arthritis ([place])     . Stomach ulcer   . Swallowing difficulty   . TIA (transient ischemic attack)   . Ulcer    Peptic ulcer H. Pylori + s/p treatment.  Upper GI diagnosed.[name] Prilosec PRN .    PAST SURGICAL HISTORY: Past Surgical History:  Procedure Laterality Date  .  2 SPINAL INJECTIONS     . ABDOMINAL HYSTERECTOMY  11/02/1979   DUB; cervical dysplasia; ovaries intact.  . ABDOMINAL SURGERY     staph abcess   . [facility] Admission  age 72; three months in [place].  Marland Kitchen BREAST BIOPSY    . CARDIAC CATHETERIZATION  11/02/2007   normal coronary arteries.  . CARPAL TUNNEL RELEASE     Bilateral.  . CATARACT EXTRACTION, BILATERAL    . CHOLECYSTECTOMY    . ESOPHAGEAL MANOMETRY N/A 09/14/2017   Procedure: ESOPHAGEAL MANOMETRY (EM);  Surgeon: Ronnette Juniper, MD;  Location: [facility];  Service: Gastroenterology;  Laterality: N/A;  . EYE SURGERY     Cataracts B. Laser surgery x 7 for Diabetic Retinopathy  . TONSILLECTOMY      SOCIAL HISTORY: Social History   Tobacco Use  . Smoking status: Former Research scientist (life sciences)  . Smokeless tobacco: Never Used  . Tobacco comment: Quit 1987  Substance Use Topics  . Alcohol use: No    Alcohol/week: 0.0 standard drinks  . Drug use: No    FAMILY HISTORY: Family History  Adopted: Yes  Family history unknown: Yes    ROS: Review of Systems  Constitutional: Positive for malaise/fatigue. Negative for weight loss.  Gastrointestinal: Negative for nausea and vomiting.  Musculoskeletal:       Negative for muscle weakness.  Endo/Heme/Allergies:       Negative for hypoglycemia.    PHYSICAL EXAM: Blood pressure 121/62, pulse 96, temperature 97.9 F (36.6 C), temperature source Oral, height _0  (1.575 m), weight 208 lb (94.3 kg), SpO2 99 %. Body mass index is 38.04 kg/m. Physical Exam  Constitutional: She is oriented to person, place, and time. She appears well-developed and well-nourished.  Cardiovascular: Normal rate.  Pulmonary/Chest: Effort normal.   Musculoskeletal: Normal range of motion.  Neurological: She is oriented to person, place, and time.  Skin: Skin is warm and dry.  Psychiatric: She has a normal mood and affect. Her behavior is normal.  Vitals reviewed.   RECENT LABS AND TESTS: BMET    Component Value Date/Time   NA 141 08/03/2018 1209   K 3.9 08/03/2018 1209   CL 96 08/03/2018 1209   CO2 29 08/03/2018 1209   GLUCOSE 97 08/03/2018 1209   GLUCOSE 58 (L) 09/08/2016 1035   BUN 38 (H) 08/03/2018 1209   CREATININE 1.35 (H) 08/03/2018 1209   CREATININE 1.30 (H) 09/08/2016 1035   CALCIUM 9.9 08/03/2018 1209   GFRNONAA 41 (L) 08/03/2018 1209   GFRNONAA 36 (L) 07/10/2014 1354   GFRAA 47 (L) 08/03/2018 1209   GFRAA 41 (L) 07/10/2014 1354   Lab Results  Component Value Date   HGBA1C 6.0 (H) 08/03/2018   HGBA1C 8.1 (H) 03/07/2018   HGBA1C 11.0 01/09/2018   HGBA1C 8.0 10/07/2017   HGBA1C 7.0 06/28/2017   Lab Results  Component Value Date   INSULIN 6.2 03/07/2018   CBC    Component Value Date/Time   WBC 5.1 01/30/2018 1000   WBC 5.7 09/08/2016 1035   RBC 3.38 (L) 01/30/2018 1000   RBC 3.82 09/08/2016 1035   HGB 10.9 (L) 01/30/2018 1000   HCT 30.8 (L) 01/30/2018 1000   PLT 226 01/30/2018 1000   MCV 91 01/30/2018 1000   MCH 32.2 01/30/2018 1000   MCH 29.8 09/08/2016 1035   MCHC 35.4 01/30/2018 1000   MCHC 32.5 09/08/2016 1035   RDW 14.8 01/30/2018 1000   LYMPHSABS 1.7 01/30/2018 1000   MONOABS 513 09/08/2016 1035   EOSABS 0.3 01/30/2018 1000   BASOSABS 0.0 01/30/2018 1000   Iron/TIBC/Ferritin/ %Sat    Component Value Date/Time   IRON 79 02/03/2016 1015   TIBC 309 02/03/2016 1015   IRONPCTSAT  26 02/03/2016 1015   Lipid Panel     Component Value Date/Time   CHOL 124 08/03/2018 1209   TRIG 204 (H) 08/03/2018 1209   HDL 32 (L) 08/03/2018 1209   CHOLHDL 3.9 08/03/2018 1209   CHOLHDL 5.3 (H) 09/08/2016 1035   VLDL 46 (H) 09/08/2016 1035   LDLCALC 51 08/03/2018 1209   Hepatic Function Panel      Component Value Date/Time   PROT 7.3 08/03/2018 1209   ALBUMIN 4.0 08/03/2018 1209   AST 24 08/03/2018 1209   ALT 16 08/03/2018 1209   ALKPHOS 98 08/03/2018 1209   BILITOT 0.3 08/03/2018 1209      Component Value Date/Time   TSH 0.958 03/07/2018 1310   TSH 0.874 01/30/2018 1000   TSH 1.060 03/23/2017 1230   Results for Angel French, Angel French (MRN [id number]) as of 09/27/2018 05:52  Ref. Range 08/03/2018 12:09  Vitamin D, 25-Hydroxy Latest Ref Range: 30.0 - 100.0 ng/mL 59.0   ASSESSMENT AND PLAN: Type 2 diabetes mellitus without complication, without long-term current use of insulin (HCC)  Vitamin D deficiency  Class 2 severe obesity with serious comorbidity and body mass index (BMI) of 38.0 to 38.9 in adult, unspecified obesity type ([place])  PLAN:  Diabetes II Karah has been given extensive diabetes education by myself today including ideal fasting and post-prandial blood glucose readings, individual ideal Hgb A1c goals, and hypoglycemia prevention. We discussed the importance of good blood sugar control to decrease the likelihood of diabetic complications such as nephropathy, neuropathy, limb loss, blindness, coronary artery disease, and death. We discussed the importance of intensive lifestyle modification including diet, exercise and weight loss as the first line treatment for diabetes. Angel French agrees to continue her current diabetes medications and will follow up at the agreed upon time in 2 weeks.  Vitamin D Deficiency Angel French was informed that low vitamin D levels contributes to fatigue and are associated with obesity, breast, and colon cancer. She agrees to continue to take OTC Vit D 5,000 units every day and will follow up for routine testing of vitamin D, at least 2-3 times per year. She was informed of the risk of over-replacement of vitamin D and agrees to not increase her dose unless she discusses this with [name] first. Angel French agrees to follow up as directed.  I spent > than 50% of  the 15 minute visit on counseling as documented in the note.  Obesity Angel French is currently in the action stage of change. As such, her goal is to continue with weight loss efforts. She has agreed to keep a food journal with 1200 to 1300 calories and 85+ grams of protein.  Angel French has been instructed to work up to a goal of 150 minutes of combined cardio and strengthening exercise per week for weight loss and overall health benefits. We discussed the following Behavioral Modification Strategies today: increasing lean protein intake, increasing vegetables, work on meal planning and easy cooking plans, and planning for success.  Angel French has agreed to follow up with our clinic in 2 weeks. She was informed of the importance of frequent follow up visits to maximize her success with intensive lifestyle modifications for her multiple health conditions.   OBESITY BEHAVIORAL INTERVENTION VISIT  Today's visit was # 12   Starting weight: 224 lbs Starting date: 03/07/18 Today's weight : Weight: 208 lb (94.3 kg)  Today's date: 09/21/2018 Total lbs lost to date: 16  ASK: We discussed the diagnosis of obesity with Angel French today and Angel French  agreed to give [name] permission to discuss obesity behavioral modification therapy today.  ASSESS: Angel French has the diagnosis of obesity and her BMI today is 38.03. Angel French is in the action stage of change.   ADVISE: Angel French was educated on the multiple health risks of obesity as well as the benefit of weight loss to improve her health. She was advised of the need for long term treatment and the importance of lifestyle modifications to improve her current health and to decrease her risk of future health problems.  AGREE: Multiple dietary modification options and treatment options were discussed and Angel French agreed to follow the recommendations documented in the above note.  ARRANGE: Angel French was educated on the importance of frequent visits to treat obesity as outlined  per CMS and USPSTF guidelines and agreed to schedule her next follow up appointment today.  I, Marcille Blanco, am acting as Location manager for Eber Jones, MD  I have reviewed the above documentation for accuracy and completeness, and I agree with the above. - Ilene Qua, MD

## 2018-10-02 DIAGNOSIS — M5136 Other intervertebral disc degeneration, lumbar region: Secondary | ICD-10-CM | POA: Diagnosis not present

## 2018-10-02 DIAGNOSIS — M4180 Other forms of scoliosis, site unspecified: Secondary | ICD-10-CM | POA: Diagnosis not present

## 2018-10-02 DIAGNOSIS — M0609 Rheumatoid arthritis without rheumatoid factor, multiple sites: Secondary | ICD-10-CM | POA: Diagnosis not present

## 2018-10-02 DIAGNOSIS — M533 Sacrococcygeal disorders, not elsewhere classified: Secondary | ICD-10-CM | POA: Diagnosis not present

## 2018-10-02 DIAGNOSIS — M4150 Other secondary scoliosis, site unspecified: Secondary | ICD-10-CM | POA: Insufficient documentation

## 2018-10-02 DIAGNOSIS — M545 Low back pain: Secondary | ICD-10-CM | POA: Diagnosis not present

## 2018-10-09 ENCOUNTER — Encounter (INDEPENDENT_AMBULATORY_CARE_PROVIDER_SITE_OTHER): Payer: Self-pay | Admitting: Family Medicine

## 2018-10-09 ENCOUNTER — Ambulatory Visit (INDEPENDENT_AMBULATORY_CARE_PROVIDER_SITE_OTHER): Payer: Medicare Other | Admitting: Family Medicine

## 2018-10-09 VITALS — BP 125/70 | HR 86 | Temp 98.2°F | Ht 62.0 in | Wt 203.0 lb

## 2018-10-09 DIAGNOSIS — E1142 Type 2 diabetes mellitus with diabetic polyneuropathy: Secondary | ICD-10-CM | POA: Diagnosis not present

## 2018-10-09 DIAGNOSIS — Z794 Long term (current) use of insulin: Secondary | ICD-10-CM

## 2018-10-09 DIAGNOSIS — Z6837 Body mass index (BMI) 37.0-37.9, adult: Secondary | ICD-10-CM | POA: Diagnosis not present

## 2018-10-09 NOTE — Progress Notes (Signed)
Office: [phone]  /  Fax: [phone]   HPI:   Chief Complaint: OBESITY Angel French is here to discuss her progress with her obesity treatment plan. She is on the  keep a food journal with 1200-1300 calories and 85+g of protein daily and is following her eating plan approximately 95 % of the time. She states she is exercising 0 minutes 0 times per week. Angel French is following the plan well since her last visit. She is not eating enough protein and struggling with boredom with protein options.  Her weight is 203 lb (92.1 kg) today and has had a weight loss of 5 pounds over a period of 2 weeks since her last visit. She has lost 21 lbs since starting treatment with [name].  Diabetes II Angel French has a diagnosis of diabetes type II. Angel French states fasting BGs low 100s and 2 hour postprandial  BGs range between 140 and 170 and denies any hypoglycemic episodes. Last A1c was 6.0. She has been working on intensive lifestyle modifications including diet, exercise, and weight loss to help control her blood glucose levels. She is currently on levemir and novolog.    ALLERGIES: Allergies  Allergen Reactions  . Codeine Anaphylaxis  . Contrast Media [Iodinated Diagnostic Agents] Anaphylaxis  . Nitrofurantoin Monohyd Macro Anaphylaxis  . Betadine [Povidone Iodine] Itching  . Folic Acid Itching  . Gabapentin Other (See Comments)    Makes patient feel drunk  . Iodine Hives  . Lyrica [Pregabalin] Other (See Comments)    Makes patient feel drunk  . Red Dye Itching  . Ultram [Tramadol Hcl] Nausea And Vomiting    MEDICATIONS: Current Outpatient Medications on File Prior to Visit  Medication Sig Dispense Refill  . acetaminophen (TYLENOL) 325 MG tablet Take 650 mg by mouth every 6 (six) hours as needed (every 6 weeks before RA infusion).    Marland Kitchen allopurinol (ZYLOPRIM) 100 MG tablet Take 1 tablet (100 mg total) by mouth daily. Ov needed (Patient taking differently: Take 200 mg by mouth daily. ) 90 tablet 3  .  aspirin 325 MG tablet Take 325 mg by mouth daily.     . Blood Glucose Monitoring Suppl (BLOOD GLUCOSE METER KIT AND SUPPLIES) KIT Dispense based on patient and insurance preference. Use up to four times daily as directed. (FOR ICD-9 250.00, 250.01). 1 each 11  . Cholecalciferol (VITAMIN D3) 5000 units CAPS Take 1 capsule by mouth daily.    . diclofenac sodium (VOLTAREN) 1 % GEL Apply 2 g topically 4 (four) times daily. (Patient taking differently: Apply 2 g topically 2 (two) times daily as needed (pain). ) 100 g 3  . diphenhydrAMINE (BENADRYL) 25 MG tablet Take 25 mg by mouth every 6 (six) hours as needed (every 6 weeks prior to RA infusion).    . DULoxetine (CYMBALTA) 30 MG capsule TAKE 1 CAPSULE BY MOUTH EVERY DAY 90 capsule 3  . escitalopram (LEXAPRO) 20 MG tablet TAKE 1 TABLET BY MOUTH EVERY DAY 90 tablet 1  . furosemide (LASIX) 20 MG tablet Take 1-3 tablets (20-60 mg total) by mouth daily. 200 tablet 1  . glucose blood test strip Check sugar three times daily  Dx: DMII insulin dependent with retinopathy, neuropathy controlled 300 each 3  . HYDROcodone-acetaminophen (NORCO) 10-325 MG tablet Take 1 tablet by mouth every 12 (twelve) hours as needed. 60 tablet 0  . insulin aspart (NOVOLOG) 100 UNIT/ML injection Inject 25 Units into the skin 3 (three) times daily with meals. 10 mL 11  . Insulin  Syringes, Disposable, U-100 0.5 ML MISC 28 Units by Does not apply route 2 (two) times daily. 100 each 11  . leflunomide (ARAVA) 20 MG tablet Take 20 mg by mouth daily.     Marland Kitchen LEVEMIR 100 UNIT/ML injection INJECT 60 UNITS AT BEDTIME AS DIRECTED 20 mL 1  . Needles & Syringes MISC 1 Syringe by Does not apply route 2 (two) times daily. 100 each 11  . omeprazole (PRILOSEC) 20 MG capsule TAKE 1 CAPSULE BY MOUTH EVERY DAY 90 capsule 3  . oxybutynin (DITROPAN XL) 15 MG 24 hr tablet TAKE 1 TABLET BY MOUTH AT BEDTIME 90 tablet 3  . predniSONE (DELTASONE) 5 MG tablet Take 5 mg by mouth daily with breakfast. Only takes  with RA Flare.    . rosuvastatin (CRESTOR) 10 MG tablet Take 1 tablet (10 mg total) by mouth daily. 90 tablet 1  . traZODone (DESYREL) 100 MG tablet TAKE 2 TABLETS BY MOUTH EVERY DAY AT BEDTIME 180 tablet 1  . ULTICARE INSULIN SYRINGE 31G X 5/16" 0.5 ML MISC USE AS DIRECTED TO INJECT INSULIN 2 TIMES DAILY 100 each 4   No current facility-administered medications on file prior to visit.     PAST MEDICAL HISTORY: Past Medical History:  Diagnosis Date  . Allergy    generic allergy pill; Spring and Fall only.  . Anxiety   . Arthritis    DDD lumbar, R hip OA.  s/p ortho consult in past.  . Blood transfusion without reported diagnosis    Mountain climbing accident in [place].  . Brachial plexus disorders   . Cataract    B retractions.  . Chronic kidney disease    stage 3 per pt.   . Chronic pain syndrome   . Chronic renal insufficiency, stage 3 (moderate) (HCC)   . Constipation   . DDD (degenerative disc disease), lumbar   . Depression   . Diabetes mellitus   . Diabetic peripheral neuropathy associated with type 2 diabetes mellitus ([place])   . Diabetic retinopathy ([place])   . Diabetic retinopathy associated with type 2 diabetes mellitus ([place])    s/p laser treatment multiple.  Unable to drive.  . Fatty liver   . Fibromyalgia   . Food allergy   . GERD (gastroesophageal reflux disease)   . Hypercholesteremia   . Hyperlipidemia   . Hypertension    controlled, off meds   . IBS (irritable bowel syndrome)   . Leg edema   . Neuromuscular disorder ([place])   . OSA (obstructive sleep apnea)   . Osteoarthritis   . Rheumatic fever   . Rheumatoid arthritis ([place])   . Stomach ulcer   . Swallowing difficulty   . TIA (transient ischemic attack)   . Ulcer    Peptic ulcer H. Pylori + s/p treatment.  Upper GI diagnosed.Angel French .    PAST SURGICAL HISTORY: Past Surgical History:  Procedure Laterality Date  .  2 SPINAL INJECTIONS     . ABDOMINAL HYSTERECTOMY  11/02/1979   DUB;  cervical dysplasia; ovaries intact.  . ABDOMINAL SURGERY     staph abcess   . [facility] Admission     age 39; three months in [place].  Marland Kitchen BREAST BIOPSY    . CARDIAC CATHETERIZATION  11/02/2007   normal coronary arteries.  . CARPAL TUNNEL RELEASE     Bilateral.  . CATARACT EXTRACTION, BILATERAL    . CHOLECYSTECTOMY    . ESOPHAGEAL MANOMETRY N/A 09/14/2017   Procedure:  ESOPHAGEAL MANOMETRY (EM);  Surgeon: Ronnette Juniper, MD;  Location: [facility];  Service: Gastroenterology;  Laterality: N/A;  . EYE SURGERY     Cataracts B. Laser surgery x 7 for Diabetic Retinopathy  . TONSILLECTOMY      SOCIAL HISTORY: Social History   Tobacco Use  . Smoking status: Former Research scientist (life sciences)  . Smokeless tobacco: Never Used  . Tobacco comment: Quit 1987  Substance Use Topics  . Alcohol use: No    Alcohol/week: 0.0 standard drinks  . Drug use: No    FAMILY HISTORY: Family History  Adopted: Yes  Family history unknown: Yes    ROS: Review of Systems  Constitutional: Positive for weight loss.  Endo/Heme/Allergies:       Negative for hypoglycemia    PHYSICAL EXAM: Blood pressure 125/70, pulse 86, temperature 98.2 F (36.8 C), temperature source Oral, height '5\' 2"'$  (1.575 m), weight 203 lb (92.1 kg), SpO2 97 %. Body mass index is 37.13 kg/m. Physical Exam  Constitutional: She is oriented to person, place, and time. She appears well-developed and well-nourished.  HENT:  Head: Normocephalic.  Eyes: Pupils are equal, round, and reactive to light.  Neck: Normal range of motion.  Cardiovascular: Normal rate.  Pulmonary/Chest: Effort normal.  Musculoskeletal: Normal range of motion.  Neurological: She is alert and oriented to person, place, and time.  Skin: Skin is warm and dry.  Psychiatric: She has a normal mood and affect. Her behavior is normal.  Vitals reviewed.   RECENT LABS AND TESTS: BMET    Component Value Date/Time   NA 141 08/03/2018 1209   K 3.9 08/03/2018 1209   CL  96 08/03/2018 1209   CO2 29 08/03/2018 1209   GLUCOSE 97 08/03/2018 1209   GLUCOSE 58 (L) 09/08/2016 1035   BUN 38 (H) 08/03/2018 1209   CREATININE 1.35 (H) 08/03/2018 1209   CREATININE 1.30 (H) 09/08/2016 1035   CALCIUM 9.9 08/03/2018 1209   GFRNONAA 41 (L) 08/03/2018 1209   GFRNONAA 36 (L) 07/10/2014 1354   GFRAA 47 (L) 08/03/2018 1209   GFRAA 41 (L) 07/10/2014 1354   Lab Results  Component Value Date   HGBA1C 6.0 (H) 08/03/2018   HGBA1C 8.1 (H) 03/07/2018   HGBA1C 11.0 01/09/2018   HGBA1C 8.0 10/07/2017   HGBA1C 7.0 06/28/2017   Lab Results  Component Value Date   INSULIN 6.2 03/07/2018   CBC    Component Value Date/Time   WBC 5.1 01/30/2018 1000   WBC 5.7 09/08/2016 1035   RBC 3.38 (L) 01/30/2018 1000   RBC 3.82 09/08/2016 1035   HGB 10.9 (L) 01/30/2018 1000   HCT 30.8 (L) 01/30/2018 1000   PLT 226 01/30/2018 1000   MCV 91 01/30/2018 1000   MCH 32.2 01/30/2018 1000   MCH 29.8 09/08/2016 1035   MCHC 35.4 01/30/2018 1000   MCHC 32.5 09/08/2016 1035   RDW 14.8 01/30/2018 1000   LYMPHSABS 1.7 01/30/2018 1000   MONOABS 513 09/08/2016 1035   EOSABS 0.3 01/30/2018 1000   BASOSABS 0.0 01/30/2018 1000   Iron/TIBC/Ferritin/ %Sat    Component Value Date/Time   IRON 79 02/03/2016 1015   TIBC 309 02/03/2016 1015   IRONPCTSAT 26 02/03/2016 1015   Lipid Panel     Component Value Date/Time   CHOL 124 08/03/2018 1209   TRIG 204 (H) 08/03/2018 1209   HDL 32 (L) 08/03/2018 1209   CHOLHDL 3.9 08/03/2018 1209   CHOLHDL 5.3 (H) 09/08/2016 1035   VLDL 46 (H) 09/08/2016 1035  LDLCALC 51 08/03/2018 1209   Hepatic Function Panel     Component Value Date/Time   PROT 7.3 08/03/2018 1209   ALBUMIN 4.0 08/03/2018 1209   AST 24 08/03/2018 1209   ALT 16 08/03/2018 1209   ALKPHOS 98 08/03/2018 1209   BILITOT 0.3 08/03/2018 1209      Component Value Date/Time   TSH 0.958 03/07/2018 1310   TSH 0.874 01/30/2018 1000   TSH 1.060 03/23/2017 1230    ASSESSMENT AND  PLAN: Type 2 diabetes mellitus with diabetic neuropathy, with long-term current use of insulin (HCC)  Class 2 severe obesity with serious comorbidity and body mass index (BMI) of 37.0 to 37.9 in adult, unspecified obesity type ([place])  PLAN: Diabetes II on insulin with peripheral neuropathy Rheba has been given extensive diabetes education by myself today including ideal fasting and post-prandial blood glucose readings, individual ideal HgA1c goals  and hypoglycemia prevention.  We discussed the importance of intensive lifestyle modification including diet, exercise and weight loss as the first line treatment for diabetes. Ondria agrees to continue her diabetes medications and will follow up at the agreed upon time.  Obesity Ramatoulaye is currently in the action stage of change. As such, her goal is to continue with weight loss efforts She has agreed to keep a food journal with 1200-1300 calories and 85+g of protein daily.  Keyonna has been instructed not to exercise yet.  We discussed the following Behavioral Modification Strategies today: increasing lean protein intake and planning for success.   Anum has agreed to follow up with our clinic in 2-3 weeks. She was informed of the importance of frequent follow up visits to maximize her success with intensive lifestyle modifications for her multiple health conditions.   OBESITY BEHAVIORAL INTERVENTION VISIT  Today's visit was # 13   Starting weight: 224 lb Starting date: 5/7/192 Today's weight : Weight: 203 lb (92.1 kg)  Today's date: 10/09/2018 Total lbs lost to date: 21 lb At least 15 minutes were spent on discussing the following behavioral intervention visit.   ASK: We discussed the diagnosis of obesity with Lakisha S Kinton today and Daren agreed to give [name] permission to discuss obesity behavioral modification therapy today.  ASSESS: Biddie has the diagnosis of obesity and her BMI today is 37.12 Revonda is in the action stage of  change   ADVISE: Navayah was educated on the multiple health risks of obesity as well as the benefit of weight loss to improve her health. She was advised of the need for long term treatment and the importance of lifestyle modifications to improve her current health and to decrease her risk of future health problems.  AGREE: Multiple dietary modification options and treatment options were discussed and  Alante agreed to follow the recommendations documented in the above note.  ARRANGE: Neve was educated on the importance of frequent visits to treat obesity as outlined per CMS and USPSTF guidelines and agreed to schedule her next follow up appointment today.  I, Renee Ramus, am acting as Location manager for [employer], FNP-C.  I have reviewed the above documentation for accuracy and completeness, and I agree with the above.  - Dawn Whitmire, FNP-C.

## 2018-10-10 ENCOUNTER — Encounter (INDEPENDENT_AMBULATORY_CARE_PROVIDER_SITE_OTHER): Payer: Self-pay | Admitting: Family Medicine

## 2018-10-23 ENCOUNTER — Ambulatory Visit (INDEPENDENT_AMBULATORY_CARE_PROVIDER_SITE_OTHER): Payer: Medicare Other | Admitting: Family Medicine

## 2018-10-23 ENCOUNTER — Encounter (INDEPENDENT_AMBULATORY_CARE_PROVIDER_SITE_OTHER): Payer: Self-pay | Admitting: Family Medicine

## 2018-10-23 VITALS — BP 123/76 | HR 84 | Temp 98.0°F | Ht 62.0 in | Wt 205.0 lb

## 2018-10-23 DIAGNOSIS — E1140 Type 2 diabetes mellitus with diabetic neuropathy, unspecified: Secondary | ICD-10-CM

## 2018-10-23 DIAGNOSIS — Z6837 Body mass index (BMI) 37.0-37.9, adult: Secondary | ICD-10-CM

## 2018-10-23 DIAGNOSIS — Z794 Long term (current) use of insulin: Secondary | ICD-10-CM

## 2018-10-24 NOTE — Progress Notes (Signed)
Office: [phone]  /  Fax: [phone]   HPI:   Chief Complaint: OBESITY Angel French is here to discuss her progress with her obesity treatment plan. She is on the  keep a food journal with 1200-1300 calories and 85+ protein  and is following her eating plan approximately 80 % of the time. She states she is exercising 0 minutes 0 times per week. Angel French is struggling with available food over the holidays that are indulgent. Her weight is 205 lb (93 kg) today and has gained 2 lbs since her last visit. She has lost 21 lbs since starting treatment with [name].  Diabetes II with neuropathy  Angel French has a diagnosis of diabetes type II. Angel French states her FBS has been around 110 .  Her post prandial is around 140. She admits to hypoglycemic episodes that are rare. Last A1c was Hemoglobin A1C Latest Ref Rng & Units 08/03/2018 03/07/2018 01/09/2018  HGBA1C 4.8 - 5.6 % 6.0(H) 8.1(H) 11.0  Some recent data might be hidden    She has been working on intensive lifestyle modifications including diet, exercise, and weight loss to help control her blood glucose levels. She is currently on Novolog and Levimir.   ASSESSMENT AND PLAN:  Type 2 diabetes mellitus with diabetic neuropathy, with long-term current use of insulin (HCC)  Class 2 severe obesity with serious comorbidity and body mass index (BMI) of 37.0 to 37.9 in adult, unspecified obesity type ([place])  PLAN: Diabetes II with neuropathy [name] has been given extensive diabetes education by myself today including ideal fasting and post-prandial blood glucose readings, individual ideal HgA1c goals  and hypoglycemia prevention. We discussed the importance of good blood sugar control to decrease the likelihood of diabetic complications such as nephropathy, neuropathy, limb loss, blindness, coronary artery disease, and death. We discussed the importance of intensive lifestyle modification including diet, exercise and weight loss as the first line treatment for  diabetes. Abbigaile agrees to continue her diabetes medications and will follow up at the agreed upon time. Will continue the Novolog and Levimir and recheck A1C on her next visit.   Obesity Angel French is currently in the action stage of change. As such, her goal is to continue with weight loss efforts She has agreed to keep a food journal with 1200-1300 calories and 85+ protein  Angel French has been instructed to work up to a goal of 150 minutes of combined cardio and strengthening exercise per week for weight loss and overall health benefits. We discussed the following Behavioral Modification Stratagies today: Celebration eating startegies and planning for success.    Angel French has agreed to follow up with our clinic in 2 weeks. She was informed of the importance of frequent follow up visits to maximize her success with intensive lifestyle modifications for her multiple health conditions.  ALLERGIES: Allergies  Allergen Reactions  . Codeine Anaphylaxis  . Contrast Media [Iodinated Diagnostic Agents] Anaphylaxis  . Nitrofurantoin Monohyd Macro Anaphylaxis  . Betadine [Povidone Iodine] Itching  . Folic Acid Itching  . Gabapentin Other (See Comments)    Makes patient feel drunk  . Iodine Hives  . Lyrica [Pregabalin] Other (See Comments)    Makes patient feel drunk  . Red Dye Itching  . Ultram [Tramadol Hcl] Nausea And Vomiting    MEDICATIONS: Current Outpatient Medications on File Prior to Visit  Medication Sig Dispense Refill  . acetaminophen (TYLENOL) 325 MG tablet Take 650 mg by mouth every 6 (six) hours as needed (every 6 weeks before RA infusion).    Marland Kitchen  allopurinol (ZYLOPRIM) 100 MG tablet Take 1 tablet (100 mg total) by mouth daily. Ov needed (Patient taking differently: Take 200 mg by mouth daily. ) 90 tablet 3  . aspirin 325 MG tablet Take 325 mg by mouth daily.     . Blood Glucose Monitoring Suppl (BLOOD GLUCOSE METER KIT AND SUPPLIES) KIT Dispense based on patient and insurance  preference. Use up to four times daily as directed. (FOR ICD-9 250.00, 250.01). 1 each 11  . Cholecalciferol (VITAMIN D3) 5000 units CAPS Take 1 capsule by mouth daily.    . diclofenac sodium (VOLTAREN) 1 % GEL Apply 2 g topically 4 (four) times daily. (Patient taking differently: Apply 2 g topically 2 (two) times daily as needed (pain). ) 100 g 3  . diphenhydrAMINE (BENADRYL) 25 MG tablet Take 25 mg by mouth every 6 (six) hours as needed (every 6 weeks prior to RA infusion).    . DULoxetine (CYMBALTA) 30 MG capsule TAKE 1 CAPSULE BY MOUTH EVERY DAY 90 capsule 3  . escitalopram (LEXAPRO) 20 MG tablet TAKE 1 TABLET BY MOUTH EVERY DAY 90 tablet 1  . furosemide (LASIX) 20 MG tablet Take 1-3 tablets (20-60 mg total) by mouth daily. 200 tablet 1  . glucose blood test strip Check sugar three times daily  Dx: DMII insulin dependent with retinopathy, neuropathy controlled 300 each 3  . HYDROcodone-acetaminophen (NORCO) 10-325 MG tablet Take 1 tablet by mouth every 12 (twelve) hours as needed. 60 tablet 0  . insulin aspart (NOVOLOG) 100 UNIT/ML injection Inject 25 Units into the skin 3 (three) times daily with meals. 10 mL 11  . Insulin Syringes, Disposable, U-100 0.5 ML MISC 28 Units by Does not apply route 2 (two) times daily. 100 each 11  . leflunomide (ARAVA) 20 MG tablet Take 20 mg by mouth daily.     Marland Kitchen LEVEMIR 100 UNIT/ML injection INJECT 60 UNITS AT BEDTIME AS DIRECTED 20 mL 1  . Needles & Syringes MISC 1 Syringe by Does not apply route 2 (two) times daily. 100 each 11  . omeprazole (PRILOSEC) 20 MG capsule TAKE 1 CAPSULE BY MOUTH EVERY DAY 90 capsule 3  . oxybutynin (DITROPAN XL) 15 MG 24 hr tablet TAKE 1 TABLET BY MOUTH AT BEDTIME 90 tablet 3  . predniSONE (DELTASONE) 5 MG tablet Take 5 mg by mouth daily with breakfast. Only takes with RA Flare.    . rosuvastatin (CRESTOR) 10 MG tablet Take 1 tablet (10 mg total) by mouth daily. 90 tablet 1  . traZODone (DESYREL) 100 MG tablet TAKE 2 TABLETS BY  MOUTH EVERY DAY AT BEDTIME 180 tablet 1  . ULTICARE INSULIN SYRINGE 31G X 5/16" 0.5 ML MISC USE AS DIRECTED TO INJECT INSULIN 2 TIMES DAILY 100 each 4   No current facility-administered medications on file prior to visit.     PAST MEDICAL HISTORY: Past Medical History:  Diagnosis Date  . Allergy    generic allergy pill; Spring and Fall only.  . Anxiety   . Arthritis    DDD lumbar, R hip OA.  s/p ortho consult in past.  . Blood transfusion without reported diagnosis    Mountain climbing accident in [place].  . Brachial plexus disorders   . Cataract    B retractions.  . Chronic kidney disease    stage 3 per pt.   . Chronic pain syndrome   . Chronic renal insufficiency, stage 3 (moderate) (HCC)   . Constipation   . DDD (degenerative disc disease), lumbar   .  Depression   . Diabetes mellitus   . Diabetic peripheral neuropathy associated with type 2 diabetes mellitus ([place])   . Diabetic retinopathy ([place])   . Diabetic retinopathy associated with type 2 diabetes mellitus ([place])    s/p laser treatment multiple.  Unable to drive.  . Fatty liver   . Fibromyalgia   . Food allergy   . GERD (gastroesophageal reflux disease)   . Hypercholesteremia   . Hyperlipidemia   . Hypertension    controlled, off meds   . IBS (irritable bowel syndrome)   . Leg edema   . Neuromuscular disorder ([place])   . OSA (obstructive sleep apnea)   . Osteoarthritis   . Rheumatic fever   . Rheumatoid arthritis ([place])   . Stomach ulcer   . Swallowing difficulty   . TIA (transient ischemic attack)   . Ulcer    Peptic ulcer H. Pylori + s/p treatment.  Upper GI diagnosed.[name] Prilosec PRN .    PAST SURGICAL HISTORY: Past Surgical History:  Procedure Laterality Date  .  2 SPINAL INJECTIONS     . ABDOMINAL HYSTERECTOMY  11/02/1979   DUB; cervical dysplasia; ovaries intact.  . ABDOMINAL SURGERY     staph abcess   . [facility] Admission     age 20; three months in [place].  Marland Kitchen BREAST BIOPSY      . CARDIAC CATHETERIZATION  11/02/2007   normal coronary arteries.  . CARPAL TUNNEL RELEASE     Bilateral.  . CATARACT EXTRACTION, BILATERAL    . CHOLECYSTECTOMY    . ESOPHAGEAL MANOMETRY N/A 09/14/2017   Procedure: ESOPHAGEAL MANOMETRY (EM);  Surgeon: Ronnette Juniper, MD;  Location: [facility];  Service: Gastroenterology;  Laterality: N/A;  . EYE SURGERY     Cataracts B. Laser surgery x 7 for Diabetic Retinopathy  . TONSILLECTOMY      SOCIAL HISTORY: Social History   Tobacco Use  . Smoking status: Former Research scientist (life sciences)  . Smokeless tobacco: Never Used  . Tobacco comment: Quit 1987  Substance Use Topics  . Alcohol use: No    Alcohol/week: 0.0 standard drinks  . Drug use: No    FAMILY HISTORY: Family History  Adopted: Yes  Family history unknown: Yes    ROS: Review of Systems  All other systems reviewed and are negative.   PHYSICAL EXAM: Blood pressure 123/76, pulse 84, temperature 98 F (36.7 C), temperature source Oral, height '5\' 2"'$  (1.575 m), weight 205 lb (93 kg), SpO2 99 %. Body mass index is 37.49 kg/m. Physical Exam Vitals signs reviewed.  HENT:     Head: Normocephalic.  Eyes:     Extraocular Movements: Extraocular movements intact.  Neck:     Musculoskeletal: Normal range of motion.  Cardiovascular:     Rate and Rhythm: Normal rate.  Pulmonary:     Effort: Pulmonary effort is normal.  Musculoskeletal: Normal range of motion.  Skin:    General: Skin is warm and dry.  Neurological:     Mental Status: She is alert.  Psychiatric:        Behavior: Behavior normal.     RECENT LABS AND TESTS: BMET    Component Value Date/Time   NA 141 08/03/2018 1209   K 3.9 08/03/2018 1209   CL 96 08/03/2018 1209   CO2 29 08/03/2018 1209   GLUCOSE 97 08/03/2018 1209   GLUCOSE 58 (L) 09/08/2016 1035   BUN 38 (H) 08/03/2018 1209   CREATININE 1.35 (H) 08/03/2018 1209   CREATININE  1.30 (H) 09/08/2016 1035   CALCIUM 9.9 08/03/2018 1209   GFRNONAA 41 (L) 08/03/2018 1209    GFRNONAA 36 (L) 07/10/2014 1354   GFRAA 47 (L) 08/03/2018 1209   GFRAA 41 (L) 07/10/2014 1354   Lab Results  Component Value Date   HGBA1C 6.0 (H) 08/03/2018   HGBA1C 8.1 (H) 03/07/2018   HGBA1C 11.0 01/09/2018   HGBA1C 8.0 10/07/2017   HGBA1C 7.0 06/28/2017   Lab Results  Component Value Date   INSULIN 6.2 03/07/2018   CBC    Component Value Date/Time   WBC 5.1 01/30/2018 1000   WBC 5.7 09/08/2016 1035   RBC 3.38 (L) 01/30/2018 1000   RBC 3.82 09/08/2016 1035   HGB 10.9 (L) 01/30/2018 1000   HCT 30.8 (L) 01/30/2018 1000   PLT 226 01/30/2018 1000   MCV 91 01/30/2018 1000   MCH 32.2 01/30/2018 1000   MCH 29.8 09/08/2016 1035   MCHC 35.4 01/30/2018 1000   MCHC 32.5 09/08/2016 1035   RDW 14.8 01/30/2018 1000   LYMPHSABS 1.7 01/30/2018 1000   MONOABS 513 09/08/2016 1035   EOSABS 0.3 01/30/2018 1000   BASOSABS 0.0 01/30/2018 1000   Iron/TIBC/Ferritin/ %Sat    Component Value Date/Time   IRON 79 02/03/2016 1015   TIBC 309 02/03/2016 1015   IRONPCTSAT 26 02/03/2016 1015   Lipid Panel     Component Value Date/Time   CHOL 124 08/03/2018 1209   TRIG 204 (H) 08/03/2018 1209   HDL 32 (L) 08/03/2018 1209   CHOLHDL 3.9 08/03/2018 1209   CHOLHDL 5.3 (H) 09/08/2016 1035   VLDL 46 (H) 09/08/2016 1035   LDLCALC 51 08/03/2018 1209   Hepatic Function Panel     Component Value Date/Time   PROT 7.3 08/03/2018 1209   ALBUMIN 4.0 08/03/2018 1209   AST 24 08/03/2018 1209   ALT 16 08/03/2018 1209   ALKPHOS 98 08/03/2018 1209   BILITOT 0.3 08/03/2018 1209      Component Value Date/Time   TSH 0.958 03/07/2018 1310   TSH 0.874 01/30/2018 1000   TSH 1.060 03/23/2017 1230      OBESITY BEHAVIORAL INTERVENTION VISIT  Today's visit was # 14   Starting weight: 224 lbs Starting date: 03/07/18 Today's weight : Weight: 205 lb (93 kg)  Today's date: 10/23/18 Total lbs lost to date: 21 At least 15 minutes were spent on discussing the following behavioral intervention  visit.   ASK: We discussed the diagnosis of obesity with Oline S Kinton today and Anette agreed to give [name] permission to discuss obesity behavioral modification therapy today.  ASSESS: Chamara has the diagnosis of obesity and her BMI today is 37.5 Neli is in the action stage of change   ADVISE: Symphonie was educated on the multiple health risks of obesity as well as the benefit of weight loss to improve her health. She was advised of the need for long term treatment and the importance of lifestyle modifications to improve her current health and to decrease her risk of future health problems.  AGREE: Multiple dietary modification options and treatment options were discussed and  Ogechi agreed to follow the recommendations documented in the above note.  ARRANGE: Clorissa was educated on the importance of frequent visits to treat obesity as outlined per CMS and USPSTF guidelines and agreed to schedule her next follow up appointment today.  I, April Moore, am acting as Location manager for [employer], [employer].  I have reviewed the above documentation for accuracy and completeness, and  I agree with the above.  - Roger Kettles, FNP-C.

## 2018-10-28 DIAGNOSIS — M1990 Unspecified osteoarthritis, unspecified site: Secondary | ICD-10-CM | POA: Insufficient documentation

## 2018-10-28 DIAGNOSIS — M533 Sacrococcygeal disorders, not elsewhere classified: Secondary | ICD-10-CM | POA: Diagnosis not present

## 2018-10-30 ENCOUNTER — Encounter (INDEPENDENT_AMBULATORY_CARE_PROVIDER_SITE_OTHER): Payer: Self-pay | Admitting: Family Medicine

## 2018-11-13 ENCOUNTER — Other Ambulatory Visit: Payer: Self-pay | Admitting: Family Medicine

## 2018-11-13 DIAGNOSIS — Z1231 Encounter for screening mammogram for malignant neoplasm of breast: Secondary | ICD-10-CM

## 2018-11-14 DIAGNOSIS — Z79899 Other long term (current) drug therapy: Secondary | ICD-10-CM | POA: Diagnosis not present

## 2018-11-14 DIAGNOSIS — M0609 Rheumatoid arthritis without rheumatoid factor, multiple sites: Secondary | ICD-10-CM | POA: Diagnosis not present

## 2018-11-15 ENCOUNTER — Ambulatory Visit (INDEPENDENT_AMBULATORY_CARE_PROVIDER_SITE_OTHER): Payer: Self-pay | Admitting: Family Medicine

## 2018-11-21 ENCOUNTER — Ambulatory Visit (INDEPENDENT_AMBULATORY_CARE_PROVIDER_SITE_OTHER): Payer: Self-pay | Admitting: Family Medicine

## 2018-11-21 ENCOUNTER — Encounter (INDEPENDENT_AMBULATORY_CARE_PROVIDER_SITE_OTHER): Payer: Self-pay

## 2018-12-06 ENCOUNTER — Telehealth: Payer: Self-pay | Admitting: Family Medicine

## 2018-12-06 DIAGNOSIS — M5136 Other intervertebral disc degeneration, lumbar region: Secondary | ICD-10-CM

## 2018-12-06 NOTE — Telephone Encounter (Unsigned)
Copied from [place] [id number]. Topic: Quick Communication - Rx Refill/Question >> Dec 06, 2018 11:25 AM Yvette Rack wrote: Helene Kelp with [facility] stated the HYDROcodone-acetaminophen [facility]) 10-325 MG tablet is on backorder however the [facility] on [address] has it. Helene Kelp asked that the Rx be sent to [facility] on [address]  Medication: HYDROcodone-acetaminophen ([place]) 10-325 MG tablet  Has the patient contacted their pharmacy? yes   Preferred Pharmacy (with phone number or street name): [id number] - [place], [place] [phone] (Phone) [phone] (Fax)  Agent: Please be advised that RX refills may take up to 3 business days. We ask that you follow-up with your pharmacy.

## 2018-12-06 NOTE — Telephone Encounter (Signed)
Request of change of pharmacy- medication is on back order. Please review for Rx re-send.

## 2018-12-06 NOTE — Telephone Encounter (Signed)
This encounter was created in error - please disregard.

## 2018-12-12 ENCOUNTER — Ambulatory Visit
Admission: RE | Admit: 2018-12-12 | Discharge: 2018-12-12 | Disposition: A | Discharge status: Home / Self Care | Payer: Medicare Other | Source: Ambulatory Visit | Attending: Family Medicine | Admitting: Family Medicine

## 2018-12-12 DIAGNOSIS — Z1231 Encounter for screening mammogram for malignant neoplasm of breast: Secondary | ICD-10-CM | POA: Diagnosis not present

## 2018-12-12 NOTE — Telephone Encounter (Signed)
Pt is no longer seen at this office.

## 2018-12-29 DIAGNOSIS — R0781 Pleurodynia: Secondary | ICD-10-CM | POA: Diagnosis not present

## 2018-12-29 DIAGNOSIS — N183 Chronic kidney disease, stage 3 (moderate): Secondary | ICD-10-CM | POA: Diagnosis not present

## 2018-12-29 DIAGNOSIS — M5136 Other intervertebral disc degeneration, lumbar region: Secondary | ICD-10-CM | POA: Diagnosis not present

## 2018-12-29 DIAGNOSIS — M05741 Rheumatoid arthritis with rheumatoid factor of right hand without organ or systems involvement: Secondary | ICD-10-CM | POA: Diagnosis not present

## 2018-12-29 DIAGNOSIS — E1142 Type 2 diabetes mellitus with diabetic polyneuropathy: Secondary | ICD-10-CM | POA: Diagnosis not present

## 2018-12-29 DIAGNOSIS — E1121 Type 2 diabetes mellitus with diabetic nephropathy: Secondary | ICD-10-CM | POA: Diagnosis not present

## 2018-12-29 DIAGNOSIS — M654 Radial styloid tenosynovitis [de Quervain]: Secondary | ICD-10-CM | POA: Diagnosis not present

## 2018-12-29 DIAGNOSIS — M05742 Rheumatoid arthritis with rheumatoid factor of left hand without organ or systems involvement: Secondary | ICD-10-CM | POA: Diagnosis not present

## 2018-12-29 DIAGNOSIS — G894 Chronic pain syndrome: Secondary | ICD-10-CM | POA: Diagnosis not present

## 2018-12-29 DIAGNOSIS — E113393 Type 2 diabetes mellitus with moderate nonproliferative diabetic retinopathy without macular edema, bilateral: Secondary | ICD-10-CM | POA: Diagnosis not present

## 2018-12-29 DIAGNOSIS — S20212A Contusion of left front wall of thorax, initial encounter: Secondary | ICD-10-CM | POA: Diagnosis not present

## 2018-12-29 DIAGNOSIS — K5903 Drug induced constipation: Secondary | ICD-10-CM | POA: Diagnosis not present

## 2018-12-29 DIAGNOSIS — Z794 Long term (current) use of insulin: Secondary | ICD-10-CM | POA: Diagnosis not present

## 2018-12-29 DIAGNOSIS — W19XXXA Unspecified fall, initial encounter: Secondary | ICD-10-CM | POA: Diagnosis not present

## 2019-01-02 ENCOUNTER — Ambulatory Visit (INDEPENDENT_AMBULATORY_CARE_PROVIDER_SITE_OTHER): Payer: Medicare Other | Admitting: Family Medicine

## 2019-01-02 VITALS — BP 128/80 | HR 82 | Temp 97.5°F | Ht 62.0 in | Wt 207.0 lb

## 2019-01-02 DIAGNOSIS — Z6837 Body mass index (BMI) 37.0-37.9, adult: Secondary | ICD-10-CM | POA: Diagnosis not present

## 2019-01-02 DIAGNOSIS — E1142 Type 2 diabetes mellitus with diabetic polyneuropathy: Secondary | ICD-10-CM

## 2019-01-02 DIAGNOSIS — Z794 Long term (current) use of insulin: Secondary | ICD-10-CM

## 2019-01-02 DIAGNOSIS — E559 Vitamin D deficiency, unspecified: Secondary | ICD-10-CM

## 2019-01-02 DIAGNOSIS — E7849 Other hyperlipidemia: Secondary | ICD-10-CM | POA: Diagnosis not present

## 2019-01-02 MED ORDER — INSULIN ASPART 100 UNIT/ML ~~LOC~~ SOLN: 30.0000 [IU] | Freq: Three times a day (TID) | SUBCUTANEOUS | 11 refills | Status: DC

## 2019-01-02 NOTE — Progress Notes (Addendum)
Office: [phone]  /  Fax: [phone]   HPI:   Chief Complaint: OBESITY Angel French is here to discuss her progress with her obesity treatment plan. She is on the keep a food journal with 1200-1300 calories and 85+ grams of protein daily and is following her eating plan approximately 50 % of the time. She states she is exercising 0 minutes 0 times per week. Angel French has been off track the last few weeks. She ate a whole pie on her birthday. She is now "starting over".  Her weight is 207 lb (93.9 kg) today and has gained 2 pounds since her last visit. She has lost 17 lbs since starting treatment with [name].  Diabetes II Angel French has a diagnosis of diabetes type II. [name]'s last A1c was 5.1. She states fasting BGs range between 80 and 102. She does not check her 2 hour post prandials. She notes hypoglycemia and she carries glucose pills. Her Novolog has been decreased and Levemir increased by her primary care physician. She has been working on intensive lifestyle modifications including diet, exercise, and weight loss to help control her blood glucose levels.  Hyperlipidemia Angel French has hyperlipidemia and has been trying to improve her cholesterol levels with intensive lifestyle modification including a low saturated fat diet, exercise and weight loss. She is on Crestor and last LDL was at goal (51), triglycerides high at 204, and HDL is low at 32. She denies any chest pain, claudication or myalgias.  Vitamin D Deficiency Angel French has a diagnosis of vitamin D deficiency. She is currently taking OTC Vit D and level is at goal. She denies nausea, vomiting or muscle weakness.  ASSESSMENT AND PLAN:  Type 2 diabetes mellitus with diabetic polyneuropathy, with long-term current use of insulin (HCC) - Plan: Comprehensive metabolic panel, Hemoglobin A1c  Other hyperlipidemia - Plan: Lipid Panel With LDL/HDL Ratio  Vitamin D deficiency - Plan: VITAMIN D 25 Hydroxy (Vit-D Deficiency, Fractures)  Class 2  severe obesity with serious comorbidity and body mass index (BMI) of 37.0 to 37.9 in adult, unspecified obesity type ([place])  PLAN:  Diabetes II Angel French has been given extensive diabetes education by myself today including ideal fasting and post-prandial blood glucose readings, individual ideal Hgb A1c goals and hypoglycemia prevention. We discussed the importance of good blood sugar control to decrease the likelihood of diabetic complications such as nephropathy, neuropathy, limb loss, blindness, coronary artery disease, and death. We discussed the importance of intensive lifestyle modification including diet, exercise and weight loss as the first line treatment for diabetes. Angel French agrees to continue her diabetes medications and we will check A1c and fasting glucose today. [name] agrees to follow up with our clinic in 2 week with our registered dietitian and in 4 weeks with myself.  Hyperlipidemia Angel French was informed of the American Heart Association Guidelines emphasizing intensive lifestyle modifications as the first line treatment for hyperlipidemia. We discussed many lifestyle modifications today in depth, and Angel French will continue to work on decreasing saturated fats such as fatty red meat, butter and many fried foods. Angel French agrees to continue taking Crestor, and she will also increase vegetables and lean protein in her diet and continue to work on exercise and weight loss efforts. We will check FLP today. Angel French agrees to follow up with our clinic in 2 week with our registered dietitian and in 4 weeks with myself.  Vitamin D Deficiency Angel French was informed that low vitamin D levels contributes to fatigue and are associated with obesity, breast, and colon cancer.  [name] agrees to continue taking OTC Vit D and will follow up for routine testing of vitamin D, at least 2-3 times per year. She was informed of the risk of over-replacement of vitamin D and agrees to not increase her dose unless she discusses  this with [name] first. We will check Vit D level today. Thurza agrees to follow up with our clinic in 2 week with our registered dietitian and in 4 weeks with myself.  Obesity Angel French is currently in the action stage of change. As such, her goal is to continue with weight loss efforts She has agreed to keep a food journal with 1200-1300 calories and 85+ grams of protein daily Angel French has not been prescribed exercise at this time. We discussed the following Behavioral Modification Strategies today: increasing lean protein intake, avoiding temptations, planning for success, and keep a strict food journal  Angel French has agreed to follow up with our clinic in 2 week with our registered dietitian and in 4 weeks with myself. She was informed of the importance of frequent follow up visits to maximize her success with intensive lifestyle modifications for her multiple health conditions.  ALLERGIES: Allergies  Allergen Reactions  . Codeine Anaphylaxis  . Contrast Media [Iodinated Diagnostic Agents] Anaphylaxis  . Nitrofurantoin Monohyd Macro Anaphylaxis  . Betadine [Povidone Iodine] Itching  . Folic Acid Itching  . Gabapentin Other (See Comments)    Makes patient feel drunk  . Iodine Hives  . Lyrica [Pregabalin] Other (See Comments)    Makes patient feel drunk  . Red Dye Itching  . Ultram [Tramadol Hcl] Nausea And Vomiting    MEDICATIONS: Current Outpatient Medications on File Prior to Visit  Medication Sig Dispense Refill  . acetaminophen (TYLENOL) 325 MG tablet Take 650 mg by mouth every 6 (six) hours as needed (every 6 weeks before RA infusion).    Marland Kitchen allopurinol (ZYLOPRIM) 100 MG tablet Take 1 tablet (100 mg total) by mouth daily. Ov needed (Patient taking differently: Take 200 mg by mouth daily. ) 90 tablet 3  . aspirin 325 MG tablet Take 325 mg by mouth daily.     . Blood Glucose Monitoring Suppl (BLOOD GLUCOSE METER KIT AND SUPPLIES) KIT Dispense based on patient and insurance preference. Use  up to four times daily as directed. (FOR ICD-9 250.00, 250.01). 1 each 11  . Cholecalciferol (VITAMIN D3) 5000 units CAPS Take 1 capsule by mouth daily.    . diclofenac sodium (VOLTAREN) 1 % GEL Apply 2 g topically 4 (four) times daily. (Patient taking differently: Apply 2 g topically 2 (two) times daily as needed (pain). ) 100 g 3  . diphenhydrAMINE (BENADRYL) 25 MG tablet Take 25 mg by mouth every 6 (six) hours as needed (every 6 weeks prior to RA infusion).    . DULoxetine (CYMBALTA) 30 MG capsule TAKE 1 CAPSULE BY MOUTH EVERY DAY 90 capsule 3  . escitalopram (LEXAPRO) 20 MG tablet TAKE 1 TABLET BY MOUTH EVERY DAY 90 tablet 1  . furosemide (LASIX) 20 MG tablet Take 1-3 tablets (20-60 mg total) by mouth daily. 200 tablet 1  . glucose blood test strip Check sugar three times daily  Dx: DMII insulin dependent with retinopathy, neuropathy controlled 300 each 3  . HYDROcodone-acetaminophen (NORCO) 10-325 MG tablet Take 1 tablet by mouth every 12 (twelve) hours as needed. 60 tablet 0  . Insulin Syringes, Disposable, U-100 0.5 ML MISC 28 Units by Does not apply route 2 (two) times daily. 100 each 11  . leflunomide (  ARAVA) 20 MG tablet Take 20 mg by mouth daily.     Marland Kitchen LEVEMIR 100 UNIT/ML injection INJECT 60 UNITS AT BEDTIME AS DIRECTED (Patient taking differently: Inject 90 Units into the skin daily. ) 20 mL 1  . linaclotide (LINZESS) 290 MCG CAPS capsule Take 290 mcg by mouth daily before breakfast.    . Needles & Syringes MISC 1 Syringe by Does not apply route 2 (two) times daily. 100 each 11  . omeprazole (PRILOSEC) 20 MG capsule TAKE 1 CAPSULE BY MOUTH EVERY DAY 90 capsule 3  . oxybutynin (DITROPAN XL) 15 MG 24 hr tablet TAKE 1 TABLET BY MOUTH AT BEDTIME 90 tablet 3  . predniSONE (DELTASONE) 5 MG tablet Take 5 mg by mouth daily with breakfast. Only takes with RA Flare.    . rosuvastatin (CRESTOR) 10 MG tablet Take 1 tablet (10 mg total) by mouth daily. 90 tablet 1  . traZODone (DESYREL) 100 MG  tablet TAKE 2 TABLETS BY MOUTH EVERY DAY AT BEDTIME 180 tablet 1  . ULTICARE INSULIN SYRINGE 31G X 5/16" 0.5 ML MISC USE AS DIRECTED TO INJECT INSULIN 2 TIMES DAILY 100 each 4   No current facility-administered medications on file prior to visit.     PAST MEDICAL HISTORY: Past Medical History:  Diagnosis Date  . Allergy    generic allergy pill; Spring and Fall only.  . Anxiety   . Arthritis    DDD lumbar, R hip OA.  s/p ortho consult in past.  . Blood transfusion without reported diagnosis    Mountain climbing accident in [place].  . Brachial plexus disorders   . Cataract    B retractions.  . Chronic kidney disease    stage 3 per pt.   . Chronic pain syndrome   . Chronic renal insufficiency, stage 3 (moderate) (HCC)   . Constipation   . DDD (degenerative disc disease), lumbar   . Depression   . Diabetes mellitus   . Diabetic peripheral neuropathy associated with type 2 diabetes mellitus ([place])   . Diabetic retinopathy ([place])   . Diabetic retinopathy associated with type 2 diabetes mellitus ([place])    s/p laser treatment multiple.  Unable to drive.  . Fatty liver   . Fibromyalgia   . Food allergy   . GERD (gastroesophageal reflux disease)   . Hypercholesteremia   . Hyperlipidemia   . Hypertension    controlled, off meds   . IBS (irritable bowel syndrome)   . Leg edema   . Neuromuscular disorder ([place])   . OSA (obstructive sleep apnea)   . Osteoarthritis   . Rheumatic fever   . Rheumatoid arthritis ([place])   . Stomach ulcer   . Swallowing difficulty   . TIA (transient ischemic attack)   . Ulcer    Peptic ulcer H. Pylori + s/p treatment.  Upper GI diagnosed.[name] Prilosec PRN .    PAST SURGICAL HISTORY: Past Surgical History:  Procedure Laterality Date  .  2 SPINAL INJECTIONS     . ABDOMINAL HYSTERECTOMY  11/02/1979   DUB; cervical dysplasia; ovaries intact.  . ABDOMINAL SURGERY     staph abcess   . [facility] Admission     age 76; three months in [place].  Marland Kitchen BREAST BIOPSY    . CARDIAC CATHETERIZATION  11/02/2007   normal coronary arteries.  . CARPAL TUNNEL RELEASE     Bilateral.  . CATARACT EXTRACTION, BILATERAL    . CHOLECYSTECTOMY    . ESOPHAGEAL  MANOMETRY N/A 09/14/2017   Procedure: ESOPHAGEAL MANOMETRY (EM);  Surgeon: Ronnette Juniper, MD;  Location: [facility];  Service: Gastroenterology;  Laterality: N/A;  . EYE SURGERY     Cataracts B. Laser surgery x 7 for Diabetic Retinopathy  . TONSILLECTOMY      SOCIAL HISTORY: Social History   Tobacco Use  . Smoking status: Former Research scientist (life sciences)  . Smokeless tobacco: Never Used  . Tobacco comment: Quit 1987  Substance Use Topics  . Alcohol use: No    Alcohol/week: 0.0 standard drinks  . Drug use: No    FAMILY HISTORY: Family History  Adopted: Yes  Family history unknown: Yes    ROS: Review of Systems  Constitutional: Negative for weight loss.  Cardiovascular: Negative for chest pain and claudication.  Gastrointestinal: Negative for nausea and vomiting.  Musculoskeletal: Negative for myalgias.       Negative muscle weakness  Endo/Heme/Allergies:       Positive hypoglycemia    PHYSICAL EXAM: Blood pressure 128/80, pulse 82, temperature (!) 97.5 F (36.4 C), temperature source Oral, height '5\' 2"'$  (1.575 m), weight 207 lb (93.9 kg), SpO2 98 %. Body mass index is 37.86 kg/m. Physical Exam Vitals signs reviewed.  Constitutional:      Appearance: Normal appearance. She is obese.  Cardiovascular:     Rate and Rhythm: Normal rate.     Pulses: Normal pulses.  Pulmonary:     Effort: Pulmonary effort is normal.     Breath sounds: Normal breath sounds.  Musculoskeletal: Normal range of motion.  Skin:    General: Skin is warm and dry.  Neurological:     Mental Status: She is alert and oriented to person, place, and time.  Psychiatric:        Mood and Affect: Mood normal.        Behavior: Behavior normal.     RECENT LABS AND TESTS: BMET    Component Value Date/Time   NA  141 08/03/2018 1209   K 3.9 08/03/2018 1209   CL 96 08/03/2018 1209   CO2 29 08/03/2018 1209   GLUCOSE 97 08/03/2018 1209   GLUCOSE 58 (L) 09/08/2016 1035   BUN 38 (H) 08/03/2018 1209   CREATININE 1.35 (H) 08/03/2018 1209   CREATININE 1.30 (H) 09/08/2016 1035   CALCIUM 9.9 08/03/2018 1209   GFRNONAA 41 (L) 08/03/2018 1209   GFRNONAA 36 (L) 07/10/2014 1354   GFRAA 47 (L) 08/03/2018 1209   GFRAA 41 (L) 07/10/2014 1354   Lab Results  Component Value Date   HGBA1C 6.0 (H) 08/03/2018   HGBA1C 8.1 (H) 03/07/2018   HGBA1C 11.0 01/09/2018   HGBA1C 8.0 10/07/2017   HGBA1C 7.0 06/28/2017   Lab Results  Component Value Date   INSULIN 6.2 03/07/2018   CBC    Component Value Date/Time   WBC 5.1 01/30/2018 1000   WBC 5.7 09/08/2016 1035   RBC 3.38 (L) 01/30/2018 1000   RBC 3.82 09/08/2016 1035   HGB 10.9 (L) 01/30/2018 1000   HCT 30.8 (L) 01/30/2018 1000   PLT 226 01/30/2018 1000   MCV 91 01/30/2018 1000   MCH 32.2 01/30/2018 1000   MCH 29.8 09/08/2016 1035   MCHC 35.4 01/30/2018 1000   MCHC 32.5 09/08/2016 1035   RDW 14.8 01/30/2018 1000   LYMPHSABS 1.7 01/30/2018 1000   MONOABS 513 09/08/2016 1035   EOSABS 0.3 01/30/2018 1000   BASOSABS 0.0 01/30/2018 1000   Iron/TIBC/Ferritin/ %Sat    Component Value Date/Time   IRON 79  02/03/2016 1015   TIBC 309 02/03/2016 1015   IRONPCTSAT 26 02/03/2016 1015   Lipid Panel     Component Value Date/Time   CHOL 124 08/03/2018 1209   TRIG 204 (H) 08/03/2018 1209   HDL 32 (L) 08/03/2018 1209   CHOLHDL 3.9 08/03/2018 1209   CHOLHDL 5.3 (H) 09/08/2016 1035   VLDL 46 (H) 09/08/2016 1035   LDLCALC 51 08/03/2018 1209   Hepatic Function Panel     Component Value Date/Time   PROT 7.3 08/03/2018 1209   ALBUMIN 4.0 08/03/2018 1209   AST 24 08/03/2018 1209   ALT 16 08/03/2018 1209   ALKPHOS 98 08/03/2018 1209   BILITOT 0.3 08/03/2018 1209      Component Value Date/Time   TSH 0.958 03/07/2018 1310   TSH 0.874 01/30/2018 1000    TSH 1.060 03/23/2017 1230      OBESITY BEHAVIORAL INTERVENTION VISIT  Today's visit was # 15   Starting weight: 224 lbs Starting date: 03/07/18 Today's weight : 207 lbs  Today's date: 01/02/2019 Total lbs lost to date: 17 At least 15 minutes were spent on discussing the following behavioral intervention visit.    01/02/2019  Height '5\' 2"'$  (1.575 m)  Weight 207 lb (93.9 kg)  BMI (Calculated) 37.85  BLOOD PRESSURE - SYSTOLIC 709  BLOOD PRESSURE - DIASTOLIC 80   Body Fat % 62.8 %  Total Body Water (lbs) 78.6 lbs    ASK: We discussed the diagnosis of obesity with Alayla S Kinton today and Trisha agreed to give [name] permission to discuss obesity behavioral modification therapy today.  ASSESS: Nishat has the diagnosis of obesity and her BMI today is 37.85 Sherin is in the action stage of change   ADVISE: Aven was educated on the multiple health risks of obesity as well as the benefit of weight loss to improve her health. She was advised of the need for long term treatment and the importance of lifestyle modifications to improve her current health and to decrease her risk of future health problems.  AGREE: Multiple dietary modification options and treatment options were discussed and  Leota agreed to follow the recommendations documented in the above note.  ARRANGE: Eraina was educated on the importance of frequent visits to treat obesity as outlined per CMS and USPSTF guidelines and agreed to schedule her next follow up appointment today.  Wilhemena Durie, am acting as Location manager for [employer], FNP-C.  I have reviewed the above documentation for accuracy and completeness, and I agree with the above.  - Eliezer Khawaja, FNP-C.

## 2019-01-03 ENCOUNTER — Other Ambulatory Visit (INDEPENDENT_AMBULATORY_CARE_PROVIDER_SITE_OTHER): Payer: Self-pay | Admitting: Family Medicine

## 2019-01-03 ENCOUNTER — Encounter (INDEPENDENT_AMBULATORY_CARE_PROVIDER_SITE_OTHER): Payer: Self-pay | Admitting: Family Medicine

## 2019-01-03 DIAGNOSIS — E559 Vitamin D deficiency, unspecified: Secondary | ICD-10-CM | POA: Insufficient documentation

## 2019-01-03 LAB — COMPREHENSIVE METABOLIC PANEL
ALT: 16 IU/L (ref 0–32)
AST: 23 IU/L (ref 0–40)
Albumin/Globulin Ratio: 1.3 (ref 1.2–2.2)
Albumin: 4.3 g/dL (ref 3.8–4.8)
Alkaline Phosphatase: 108 IU/L (ref 39–117)
BUN/Creatinine Ratio: 22 (ref 12–28)
BUN: 31 mg/dL — ABNORMAL HIGH (ref 8–27)
Bilirubin Total: 0.3 mg/dL (ref 0.0–1.2)
CO2: 24 mmol/L (ref 20–29)
Calcium: 9.9 mg/dL (ref 8.7–10.3)
Chloride: 100 mmol/L (ref 96–106)
Creatinine, Ser: 1.43 mg/dL — ABNORMAL HIGH (ref 0.57–1.00)
GFR calc Af Amer: 44 mL/min/{1.73_m2} — ABNORMAL LOW (ref >59)
GFR calc non Af Amer: 38 mL/min/{1.73_m2} — ABNORMAL LOW (ref >59)
Globulin, Total: 3.3 g/dL (ref 1.5–4.5)
Glucose: 38 mg/dL — CL (ref 65–99)
Potassium: 3.9 mmol/L (ref 3.5–5.2)
Sodium: 143 mmol/L (ref 134–144)
Total Protein: 7.6 g/dL (ref 6.0–8.5)

## 2019-01-03 LAB — VITAMIN D 25 HYDROXY (VIT D DEFICIENCY, FRACTURES): Vit D, 25-Hydroxy: 64.9 ng/mL (ref 30.0–100.0)

## 2019-01-03 LAB — LIPID PANEL WITH LDL/HDL RATIO
Cholesterol, Total: 162 mg/dL (ref 100–199)
HDL: 37 mg/dL — ABNORMAL LOW (ref >39)
LDL Calculated: 80 mg/dL (ref 0–99)
LDl/HDL Ratio: 2.2 ratio (ref 0.0–3.2)
Triglycerides: 225 mg/dL — ABNORMAL HIGH (ref 0–149)
VLDL Cholesterol Cal: 45 mg/dL — ABNORMAL HIGH (ref 5–40)

## 2019-01-03 LAB — HEMOGLOBIN A1C
Est. average glucose Bld gHb Est-mCnc: 100 mg/dL
Hgb A1c MFr Bld: 5.1 % (ref 4.8–5.6)

## 2019-01-03 MED ORDER — INSULIN ASPART 100 UNIT/ML ~~LOC~~ SOLN: 25.0000 [IU] | Freq: Three times a day (TID) | SUBCUTANEOUS | 11 refills | Status: DC

## 2019-01-12 DIAGNOSIS — Z79899 Other long term (current) drug therapy: Secondary | ICD-10-CM | POA: Diagnosis not present

## 2019-01-12 DIAGNOSIS — M0609 Rheumatoid arthritis without rheumatoid factor, multiple sites: Secondary | ICD-10-CM | POA: Diagnosis not present

## 2019-01-17 ENCOUNTER — Ambulatory Visit (INDEPENDENT_AMBULATORY_CARE_PROVIDER_SITE_OTHER): Payer: Self-pay | Admitting: Dietician

## 2019-01-25 ENCOUNTER — Encounter (INDEPENDENT_AMBULATORY_CARE_PROVIDER_SITE_OTHER): Payer: Self-pay

## 2019-01-30 ENCOUNTER — Other Ambulatory Visit: Payer: Self-pay

## 2019-01-30 ENCOUNTER — Encounter (INDEPENDENT_AMBULATORY_CARE_PROVIDER_SITE_OTHER): Payer: Self-pay | Admitting: Family Medicine

## 2019-01-30 ENCOUNTER — Ambulatory Visit (INDEPENDENT_AMBULATORY_CARE_PROVIDER_SITE_OTHER): Payer: Medicare Other | Admitting: Family Medicine

## 2019-01-30 DIAGNOSIS — Z6837 Body mass index (BMI) 37.0-37.9, adult: Secondary | ICD-10-CM | POA: Diagnosis not present

## 2019-01-30 DIAGNOSIS — Z794 Long term (current) use of insulin: Secondary | ICD-10-CM | POA: Diagnosis not present

## 2019-01-30 DIAGNOSIS — E1140 Type 2 diabetes mellitus with diabetic neuropathy, unspecified: Secondary | ICD-10-CM

## 2019-01-31 ENCOUNTER — Encounter (INDEPENDENT_AMBULATORY_CARE_PROVIDER_SITE_OTHER): Payer: Self-pay | Admitting: Family Medicine

## 2019-01-31 NOTE — Progress Notes (Addendum)
Office: [phone]  /  Fax: [phone] TeleHealth Visit:  Angel STRICKER has consented to this TeleHealth visit today via FaceTime. The patient is located at home, the provider is located at the [facility] office. The participants in this visit include the listed provider and patient and any and all parties involved.   HPI:   Chief Complaint: OBESITY Angel French is here to discuss her progress with her obesity treatment plan. She is on the keep a food journal with 1200 to 1300 calories and 85 grams of protein daily plan and is following her eating plan approximately 50 to 60 % of the time. She states she is exercising 0 minutes 0 times per week. Angel French weighed 210 pounds on her home scale. She feels she has gained a few pounds. Angel French is having a hard time finding protein in stores due to COVID 19. She is journaling consistently but eating more calories than she should by a few hundred per day.  We were unable to weigh the patient today for this TeleHealth visit. She feels as if she has gained weight since her last visit. She has lost 14 lbs since starting treatment with [name].  Diabetes II on insulin with neuropathy Angel French has a diagnosis of diabetes type II. Angel French states her fasting BGs are in the low 100's. Her diabetes is well controlled. Angel French is taking 30 units of Novolog three times a day and 80 units of levemir. Angel French denies any hypoglycemic episodes. Last A1c was at 5.1. She has been working on intensive lifestyle modifications including diet, exercise, and weight loss to help control her blood glucose levels.  ASSESSMENT AND PLAN:  Type 2 diabetes mellitus with diabetic neuropathy, with long-term current use of insulin (HCC)  Class 2 severe obesity with serious comorbidity and body mass index (BMI) of 37.0 to 37.9 in adult, unspecified obesity type (Angel French)  PLAN:  Diabetes II with neuropathy Angel French has been given extensive diabetes education by myself today  including ideal fasting and post-prandial blood glucose readings, individual ideal Hgb A1c goals and hypoglycemia prevention. We discussed the importance of good blood sugar control to decrease the likelihood of diabetic complications such as nephropathy, neuropathy, limb loss, blindness, coronary artery disease, and death. We discussed the importance of intensive lifestyle modification including diet, exercise and weight loss as the first line treatment for diabetes. Rees agrees to continue all her diabetes medications and meal plan. She will follow up at the agreed upon time.   I spent > than 50% of the 15 minute visit on counseling as documented in the note.  Obesity Daily is currently in the action stage of change. As such, her goal is to continue with weight loss efforts She has agreed to keep a food journal with 1200 to 1300 calories and 85 grams of protein daily Angel French has been instructed to work up to a goal of 150 minutes of combined cardio and strengthening exercise per week for weight loss and overall health benefits. We discussed the following Behavioral Modification Strategies today: increasing lean protein intake  Timothea has agreed to follow up with our clinic in 2 to 3 weeks. She was informed of the importance of frequent follow up visits to maximize her success with intensive lifestyle modifications for her multiple health conditions.  ALLERGIES: Allergies  Allergen Reactions  . Codeine Anaphylaxis  . Contrast Media [Iodinated Diagnostic Agents] Anaphylaxis  . Nitrofurantoin Monohyd Macro Anaphylaxis  . Betadine [Povidone Iodine] Itching  . Folic Acid Itching  .  Gabapentin Other (See Comments)    Makes patient feel drunk  . Iodine Hives  . Lyrica [Pregabalin] Other (See Comments)    Makes patient feel drunk  . Red Dye Itching  . Ultram [Tramadol Hcl] Nausea And Vomiting    MEDICATIONS: Current Outpatient Medications on File Prior to Visit  Medication Sig Dispense  Refill  . acetaminophen (TYLENOL) 325 MG tablet Take 650 mg by mouth every 6 (six) hours as needed (every 6 weeks before RA infusion).    Marland Kitchen allopurinol (ZYLOPRIM) 100 MG tablet Take 1 tablet (100 mg total) by mouth daily. Ov needed (Patient taking differently: Take 200 mg by mouth daily. ) 90 tablet 3  . aspirin 325 MG tablet Take 325 mg by mouth daily.     . Blood Glucose Monitoring Suppl (BLOOD GLUCOSE METER KIT AND SUPPLIES) KIT Dispense based on patient and insurance preference. Use up to four times daily as directed. (FOR ICD-9 250.00, 250.01). 1 each 11  . Cholecalciferol (VITAMIN D3) 5000 units CAPS Take 1 capsule by mouth daily.    . diclofenac sodium (VOLTAREN) 1 % GEL Apply 2 g topically 4 (four) times daily. (Patient taking differently: Apply 2 g topically 2 (two) times daily as needed (pain). ) 100 g 3  . diphenhydrAMINE (BENADRYL) 25 MG tablet Take 25 mg by mouth every 6 (six) hours as needed (every 6 weeks prior to RA infusion).    . DULoxetine (CYMBALTA) 30 MG capsule TAKE 1 CAPSULE BY MOUTH EVERY DAY 90 capsule 3  . escitalopram (LEXAPRO) 20 MG tablet TAKE 1 TABLET BY MOUTH EVERY DAY 90 tablet 1  . furosemide (LASIX) 20 MG tablet Take 1-3 tablets (20-60 mg total) by mouth daily. 200 tablet 1  . glucose blood test strip Check sugar three times daily  Dx: DMII insulin dependent with retinopathy, neuropathy controlled 300 each 3  . HYDROcodone-acetaminophen (NORCO) 10-325 MG tablet Take 1 tablet by mouth every 12 (twelve) hours as needed. 60 tablet 0  . insulin aspart (NOVOLOG) 100 UNIT/ML injection Inject 25 Units into the skin 3 (three) times daily with meals. 10 mL 11  . Insulin Syringes, Disposable, U-100 0.5 ML MISC 28 Units by Does not apply route 2 (two) times daily. 100 each 11  . leflunomide (ARAVA) 20 MG tablet Take 20 mg by mouth daily.     Marland Kitchen LEVEMIR 100 UNIT/ML injection INJECT 60 UNITS AT BEDTIME AS DIRECTED (Patient taking differently: Inject 90 Units into the skin daily. )  20 mL 1  . linaclotide (LINZESS) 290 MCG CAPS capsule Take 290 mcg by mouth daily before breakfast.    . Needles & Syringes MISC 1 Syringe by Does not apply route 2 (two) times daily. 100 each 11  . omeprazole (PRILOSEC) 20 MG capsule TAKE 1 CAPSULE BY MOUTH EVERY DAY 90 capsule 3  . oxybutynin (DITROPAN XL) 15 MG 24 hr tablet TAKE 1 TABLET BY MOUTH AT BEDTIME 90 tablet 3  . predniSONE (DELTASONE) 5 MG tablet Take 5 mg by mouth daily with breakfast. Only takes with RA Flare.    . rosuvastatin (CRESTOR) 10 MG tablet Take 1 tablet (10 mg total) by mouth daily. 90 tablet 1  . traZODone (DESYREL) 100 MG tablet TAKE 2 TABLETS BY MOUTH EVERY DAY AT BEDTIME 180 tablet 1  . ULTICARE INSULIN SYRINGE 31G X 5/16" 0.5 ML MISC USE AS DIRECTED TO INJECT INSULIN 2 TIMES DAILY 100 each 4   No current facility-administered medications on file prior to visit.  PAST MEDICAL HISTORY: Past Medical History:  Diagnosis Date  . Allergy    generic allergy pill; Spring and Fall only.  . Anxiety   . Arthritis    DDD lumbar, R hip OA.  s/p ortho consult in past.  . Blood transfusion without reported diagnosis    Mountain climbing accident in [place].  . Brachial plexus disorders   . Cataract    B retractions.  . Chronic kidney disease    stage 3 per pt.   . Chronic pain syndrome   . Chronic renal insufficiency, stage 3 (moderate) (HCC)   . Constipation   . DDD (degenerative disc disease), lumbar   . Depression   . Diabetes mellitus   . Diabetic peripheral neuropathy associated with type 2 diabetes mellitus ([place])   . Diabetic retinopathy ([place])   . Diabetic retinopathy associated with type 2 diabetes mellitus ([place])    s/p laser treatment multiple.  Unable to drive.  . Fatty liver   . Fibromyalgia   . Food allergy   . GERD (gastroesophageal reflux disease)   . Hypercholesteremia   . Hyperlipidemia   . Hypertension    controlled, off meds   . IBS (irritable bowel syndrome)   . Leg edema   .  Neuromuscular disorder ([place])   . OSA (obstructive sleep apnea)   . Osteoarthritis   . Rheumatic fever   . Rheumatoid arthritis ([place])   . Stomach ulcer   . Swallowing difficulty   . TIA (transient ischemic attack)   . Ulcer    Peptic ulcer H. Pylori + s/p treatment.  Upper GI diagnosed.[name] Prilosec PRN .    PAST SURGICAL HISTORY: Past Surgical History:  Procedure Laterality Date  .  2 SPINAL INJECTIONS     . ABDOMINAL HYSTERECTOMY  11/02/1979   DUB; cervical dysplasia; ovaries intact.  . ABDOMINAL SURGERY     staph abcess   . [facility] Admission     age 40; three months in [place].  Marland Kitchen BREAST BIOPSY    . CARDIAC CATHETERIZATION  11/02/2007   normal coronary arteries.  . CARPAL TUNNEL RELEASE     Bilateral.  . CATARACT EXTRACTION, BILATERAL    . CHOLECYSTECTOMY    . ESOPHAGEAL MANOMETRY N/A 09/14/2017   Procedure: ESOPHAGEAL MANOMETRY (EM);  Surgeon: Ronnette Juniper, MD;  Location: [facility];  Service: Gastroenterology;  Laterality: N/A;  . EYE SURGERY     Cataracts B. Laser surgery x 7 for Diabetic Retinopathy  . TONSILLECTOMY      SOCIAL HISTORY: Social History   Tobacco Use  . Smoking status: Former Research scientist (life sciences)  . Smokeless tobacco: Never Used  . Tobacco comment: Quit 1987  Substance Use Topics  . Alcohol use: No    Alcohol/week: 0.0 standard drinks  . Drug use: No    FAMILY HISTORY: Family History  Adopted: Yes  Family history unknown: Yes    ROS: Review of Systems  Constitutional: Negative for weight loss.  Endo/Heme/Allergies:       Negative for hypoglycemia    PHYSICAL EXAM: Pt in no acute distress  RECENT LABS AND TESTS: BMET    Component Value Date/Time   NA 143 01/02/2019 1258   K 3.9 01/02/2019 1258   CL 100 01/02/2019 1258   CO2 24 01/02/2019 1258   GLUCOSE 38 (LL) 01/02/2019 1258   GLUCOSE 58 (L) 09/08/2016 1035   BUN 31 (H) 01/02/2019 1258   CREATININE 1.43 (H) 01/02/2019 1258   CREATININE 1.30 (H)  09/08/2016 1035    CALCIUM 9.9 01/02/2019 1258   GFRNONAA 38 (L) 01/02/2019 1258   GFRNONAA 36 (L) 07/10/2014 1354   GFRAA 44 (L) 01/02/2019 1258   GFRAA 41 (L) 07/10/2014 1354   Lab Results  Component Value Date   HGBA1C 5.1 01/02/2019   HGBA1C 6.0 (H) 08/03/2018   HGBA1C 8.1 (H) 03/07/2018   HGBA1C 11.0 01/09/2018   HGBA1C 8.0 10/07/2017   Lab Results  Component Value Date   INSULIN 6.2 03/07/2018   CBC    Component Value Date/Time   WBC 5.1 01/30/2018 1000   WBC 5.7 09/08/2016 1035   RBC 3.38 (L) 01/30/2018 1000   RBC 3.82 09/08/2016 1035   HGB 10.9 (L) 01/30/2018 1000   HCT 30.8 (L) 01/30/2018 1000   PLT 226 01/30/2018 1000   MCV 91 01/30/2018 1000   MCH 32.2 01/30/2018 1000   MCH 29.8 09/08/2016 1035   MCHC 35.4 01/30/2018 1000   MCHC 32.5 09/08/2016 1035   RDW 14.8 01/30/2018 1000   LYMPHSABS 1.7 01/30/2018 1000   MONOABS 513 09/08/2016 1035   EOSABS 0.3 01/30/2018 1000   BASOSABS 0.0 01/30/2018 1000   Iron/TIBC/Ferritin/ %Sat    Component Value Date/Time   IRON 79 02/03/2016 1015   TIBC 309 02/03/2016 1015   IRONPCTSAT 26 02/03/2016 1015   Lipid Panel     Component Value Date/Time   CHOL 162 01/02/2019 1258   TRIG 225 (H) 01/02/2019 1258   HDL 37 (L) 01/02/2019 1258   CHOLHDL 3.9 08/03/2018 1209   CHOLHDL 5.3 (H) 09/08/2016 1035   VLDL 46 (H) 09/08/2016 1035   LDLCALC 80 01/02/2019 1258   Hepatic Function Panel     Component Value Date/Time   PROT 7.6 01/02/2019 1258   ALBUMIN 4.3 01/02/2019 1258   AST 23 01/02/2019 1258   ALT 16 01/02/2019 1258   ALKPHOS 108 01/02/2019 1258   BILITOT 0.3 01/02/2019 1258      Component Value Date/Time   TSH 0.958 03/07/2018 1310   TSH 0.874 01/30/2018 1000   TSH 1.060 03/23/2017 1230     Ref. Range 01/02/2019 12:58  Vitamin D, 25-Hydroxy Latest Ref Range: 30.0 - 100.0 ng/mL 64.9     I, Doreene Nest, am acting as Location manager for [employer], FNP-C.  I have reviewed the above documentation for accuracy and  completeness, and I agree with the above.  - Geraldine Sandberg, FNP-C.

## 2019-02-09 ENCOUNTER — Telehealth: Payer: Self-pay | Admitting: Family Medicine

## 2019-02-09 NOTE — Telephone Encounter (Signed)
Vonna Kotyk from the pharmacy called wanting to verify Dr. Reginia Forts fax number.  The fax number he has is [phone],  His return phone number is [phone]

## 2019-02-12 NOTE — Telephone Encounter (Signed)
Pharmacy is requesting contact information on former provider

## 2019-02-13 NOTE — Telephone Encounter (Signed)
Dr. Tamala Julian no longer works here and we don't have her fax number.

## 2019-02-19 ENCOUNTER — Ambulatory Visit (INDEPENDENT_AMBULATORY_CARE_PROVIDER_SITE_OTHER): Payer: Medicare Other | Admitting: Family Medicine

## 2019-02-19 ENCOUNTER — Other Ambulatory Visit: Payer: Self-pay

## 2019-02-19 ENCOUNTER — Encounter (INDEPENDENT_AMBULATORY_CARE_PROVIDER_SITE_OTHER): Payer: Self-pay | Admitting: Family Medicine

## 2019-02-19 DIAGNOSIS — Z794 Long term (current) use of insulin: Secondary | ICD-10-CM | POA: Diagnosis not present

## 2019-02-19 DIAGNOSIS — Z6837 Body mass index (BMI) 37.0-37.9, adult: Secondary | ICD-10-CM | POA: Diagnosis not present

## 2019-02-19 DIAGNOSIS — E1142 Type 2 diabetes mellitus with diabetic polyneuropathy: Secondary | ICD-10-CM | POA: Diagnosis not present

## 2019-02-20 ENCOUNTER — Encounter (INDEPENDENT_AMBULATORY_CARE_PROVIDER_SITE_OTHER): Payer: Self-pay | Admitting: Family Medicine

## 2019-02-20 NOTE — Progress Notes (Signed)
Office: [phone]  /  Fax: [phone] TeleHealth Visit:  Angel French has verbally consented to this TeleHealth visit today. The patient is located at home, the provider is located at the [facility] office. The participants in this visit include the listed provider and patient. The visit was conducted today via FaceTime.  HPI:   Chief Complaint: OBESITY Angel French is here to discuss her progress with her obesity treatment plan. She is keeping a food journal with 1200-1300 calories and 85 grams of protein and is following her eating plan approximately 75% of the time. She states she is going out in the backyard with the dogs and using more stairs. Angel French states she lost 4 lbs over the last few weeks and is 206 lbs today. She does report she is not journaling consistently. She states she gets 35-55 grams of protein daily. Her daughter is being very cautious with food brought into the house and she has very little protein aside from eggs and cheese at home. She states she is drinking a Engineer, civil (consulting) once daily when she has it. We were unable to weigh the patient today for this TeleHealth visit. She feels as if she has lost 4 lbs since her last visit. She has lost 17 lbs since starting treatment with [name].  Diabetes II with Polyneuropathy Angel French has a diagnosis of diabetes type II which is very well controlled. Last A1c was 5.1 on 01/02/2019. She is on 30 units of NovoLog with meals and 80 units of Levemir daily. [name] states CBG's are around 100 fasting and 140-145 at dinner. She reports rare hypoglycemic episodes. . She has been working on intensive lifestyle modifications including diet and weight loss to help control her blood glucose levels.  ASSESSMENT AND PLAN:  Type 2 diabetes mellitus with diabetic polyneuropathy, with long-term current use of insulin (HCC)  Class 2 severe obesity with serious comorbidity and body mass index (BMI) of 37.0 to 37.9 in adult, unspecified  obesity type (Angel French)  PLAN:  Diabetes II with Polyneuropathy Angel French has been given extensive diabetes education by myself today including ideal fasting and post-prandial blood glucose readings, individual ideal HgA1c goals  and hypoglycemia prevention. We discussed the importance of good blood sugar control to decrease the likelihood of diabetic complications such as nephropathy, neuropathy, limb loss, blindness, coronary artery disease, and death. We discussed the importance of intensive lifestyle modification including diet, exercise and weight loss as the first line treatment for diabetes. Cheryll agrees to continue her insulin at the current dosage and will follow-up at the agreed upon time.  I spent > than 50% of the 15 minute visit on counseling as documented in the note.  Obesity Angel French is currently in the action stage of change. As such, her goal is to continue with weight loss efforts. She has agreed to keep a food journal with 1200-1300 calories and 85 grams of protein.  Angel French has been instructed to continue her current exercise regimen for weight loss and overall health benefits. We discussed the following Behavioral Modification Strategies today: increasing lean protein intake, planning for success, and keep a strict food journal.  Angel French has agreed to follow-up with our clinic in 2 weeks. She was informed of the importance of frequent follow-up visits to maximize her success with intensive lifestyle modifications for her multiple health conditions.  ALLERGIES: Allergies  Allergen Reactions  . Codeine Anaphylaxis  . Contrast Media [Iodinated Diagnostic Agents] Anaphylaxis  . Nitrofurantoin Monohyd Macro Anaphylaxis  . Betadine [  Povidone Iodine] Itching  . Folic Acid Itching  . Gabapentin Other (See Comments)    Makes patient feel drunk  . Iodine Hives  . Lyrica [Pregabalin] Other (See Comments)    Makes patient feel drunk  . Red Dye Itching  . Ultram [Tramadol Hcl] Nausea  And Vomiting    MEDICATIONS: Current Outpatient Medications on File Prior to Visit  Medication Sig Dispense Refill  . acetaminophen (TYLENOL) 325 MG tablet Take 650 mg by mouth every 6 (six) hours as needed (every 6 weeks before RA infusion).    Marland Kitchen allopurinol (ZYLOPRIM) 100 MG tablet Take 1 tablet (100 mg total) by mouth daily. Ov needed (Patient taking differently: Take 200 mg by mouth daily. ) 90 tablet 3  . aspirin 325 MG tablet Take 325 mg by mouth daily.     . Blood Glucose Monitoring Suppl (BLOOD GLUCOSE METER KIT AND SUPPLIES) KIT Dispense based on patient and insurance preference. Use up to four times daily as directed. (FOR ICD-9 250.00, 250.01). 1 each 11  . Cholecalciferol (VITAMIN D3) 5000 units CAPS Take 1 capsule by mouth daily.    . diclofenac sodium (VOLTAREN) 1 % GEL Apply 2 g topically 4 (four) times daily. (Patient taking differently: Apply 2 g topically 2 (two) times daily as needed (pain). ) 100 g 3  . diphenhydrAMINE (BENADRYL) 25 MG tablet Take 25 mg by mouth every 6 (six) hours as needed (every 6 weeks prior to RA infusion).    . DULoxetine (CYMBALTA) 30 MG capsule TAKE 1 CAPSULE BY MOUTH EVERY DAY 90 capsule 3  . escitalopram (LEXAPRO) 20 MG tablet TAKE 1 TABLET BY MOUTH EVERY DAY 90 tablet 1  . furosemide (LASIX) 20 MG tablet Take 1-3 tablets (20-60 mg total) by mouth daily. 200 tablet 1  . glucose blood test strip Check sugar three times daily  Dx: DMII insulin dependent with retinopathy, neuropathy controlled 300 each 3  . HYDROcodone-acetaminophen (NORCO) 10-325 MG tablet Take 1 tablet by mouth every 12 (twelve) hours as needed. 60 tablet 0  . insulin aspart (NOVOLOG) 100 UNIT/ML injection Inject 25 Units into the skin 3 (three) times daily with meals. 10 mL 11  . Insulin Syringes, Disposable, U-100 0.5 ML MISC 28 Units by Does not apply route 2 (two) times daily. 100 each 11  . leflunomide (ARAVA) 20 MG tablet Take 20 mg by mouth daily.     Marland Kitchen LEVEMIR 100 UNIT/ML  injection INJECT 60 UNITS AT BEDTIME AS DIRECTED (Patient taking differently: Inject 90 Units into the skin daily. ) 20 mL 1  . linaclotide (LINZESS) 290 MCG CAPS capsule Take 290 mcg by mouth daily before breakfast.    . Needles & Syringes MISC 1 Syringe by Does not apply route 2 (two) times daily. 100 each 11  . omeprazole (PRILOSEC) 20 MG capsule TAKE 1 CAPSULE BY MOUTH EVERY DAY 90 capsule 3  . oxybutynin (DITROPAN XL) 15 MG 24 hr tablet TAKE 1 TABLET BY MOUTH AT BEDTIME 90 tablet 3  . predniSONE (DELTASONE) 5 MG tablet Take 5 mg by mouth daily with breakfast. Only takes with RA Flare.    . rosuvastatin (CRESTOR) 10 MG tablet Take 1 tablet (10 mg total) by mouth daily. 90 tablet 1  . traZODone (DESYREL) 100 MG tablet TAKE 2 TABLETS BY MOUTH EVERY DAY AT BEDTIME 180 tablet 1  . ULTICARE INSULIN SYRINGE 31G X 5/16" 0.5 ML MISC USE AS DIRECTED TO INJECT INSULIN 2 TIMES DAILY 100 each 4  No current facility-administered medications on file prior to visit.     PAST MEDICAL HISTORY: Past Medical History:  Diagnosis Date  . Allergy    generic allergy pill; Spring and Fall only.  . Anxiety   . Arthritis    DDD lumbar, R hip OA.  s/p ortho consult in past.  . Blood transfusion without reported diagnosis    Mountain climbing accident in [place].  . Brachial plexus disorders   . Cataract    B retractions.  . Chronic kidney disease    stage 3 per pt.   . Chronic pain syndrome   . Chronic renal insufficiency, stage 3 (moderate) (HCC)   . Constipation   . DDD (degenerative disc disease), lumbar   . Depression   . Diabetes mellitus   . Diabetic peripheral neuropathy associated with type 2 diabetes mellitus ([place])   . Diabetic retinopathy ([place])   . Diabetic retinopathy associated with type 2 diabetes mellitus ([place])    s/p laser treatment multiple.  Unable to drive.  . Fatty liver   . Fibromyalgia   . Food allergy   . GERD (gastroesophageal reflux disease)   . Hypercholesteremia   .  Hyperlipidemia   . Hypertension    controlled, off meds   . IBS (irritable bowel syndrome)   . Leg edema   . Neuromuscular disorder ([place])   . OSA (obstructive sleep apnea)   . Osteoarthritis   . Rheumatic fever   . Rheumatoid arthritis ([place])   . Stomach ulcer   . Swallowing difficulty   . TIA (transient ischemic attack)   . Ulcer    Peptic ulcer H. Pylori + s/p treatment.  Upper GI diagnosed.Angel French Prilosec PRN .    PAST SURGICAL HISTORY: Past Surgical History:  Procedure Laterality Date  .  2 SPINAL INJECTIONS     . ABDOMINAL HYSTERECTOMY  11/02/1979   DUB; cervical dysplasia; ovaries intact.  . ABDOMINAL SURGERY     staph abcess   . [facility] Ad[facility]     age 60; three months in [place].  Marland Kitchen BREAST BIOPSY    . CARDIAC CATHETERIZATION  11/02/2007   normal coronary arteries.  . CARPAL TUNNEL RELEASE     Bilateral.  . CATARACT EXTRACTION, BILATERAL    . CHOLECYSTECTOMY    . ESOPHAGEAL MANOMETRY N/A 09/14/2017   Procedure: ESOPHAGEAL MANOMETRY (EM);  Surgeon: Ronnette Juniper, MD;  Location: [facility];  Service: Gastroenterology;  Laterality: N/A;  . EYE SURGERY     Cataracts B. Laser surgery x 7 for Diabetic Retinopathy  . TONSILLECTOMY      SOCIAL HISTORY: Social History   Tobacco Use  . Smoking status: Former Research scientist (life sciences)  . Smokeless tobacco: Never Used  . Tobacco comment: Quit 1987  Substance Use Topics  . Alcohol use: No    Alcohol/week: 0.0 standard drinks  . Drug use: No    FAMILY HISTORY: Family History  Adopted: Yes  Family history unknown: Yes   ROS: Review of Systems  Endo/Heme/Allergies:       Positive for rare hypoglycemia.   PHYSICAL EXAM: Pt in no acute distress  RECENT LABS AND TESTS: BMET    Component Value Date/Time   NA 143 01/02/2019 1258   K 3.9 01/02/2019 1258   CL 100 01/02/2019 1258   CO2 24 01/02/2019 1258   GLUCOSE 38 (LL) 01/02/2019 1258   GLUCOSE 58 (L) 09/08/2016 1035   BUN 31 (H) 01/02/2019 1258   CREATININE  1.43 (H)  01/02/2019 1258   CREATININE 1.30 (H) 09/08/2016 1035   CALCIUM 9.9 01/02/2019 1258   GFRNONAA 38 (L) 01/02/2019 1258   GFRNONAA 36 (L) 07/10/2014 1354   GFRAA 44 (L) 01/02/2019 1258   GFRAA 41 (L) 07/10/2014 1354   Lab Results  Component Value Date   HGBA1C 5.1 01/02/2019   HGBA1C 6.0 (H) 08/03/2018   HGBA1C 8.1 (H) 03/07/2018   HGBA1C 11.0 01/09/2018   HGBA1C 8.0 10/07/2017   Lab Results  Component Value Date   INSULIN 6.2 03/07/2018   CBC    Component Value Date/Time   WBC 5.1 01/30/2018 1000   WBC 5.7 09/08/2016 1035   RBC 3.38 (L) 01/30/2018 1000   RBC 3.82 09/08/2016 1035   HGB 10.9 (L) 01/30/2018 1000   HCT 30.8 (L) 01/30/2018 1000   PLT 226 01/30/2018 1000   MCV 91 01/30/2018 1000   MCH 32.2 01/30/2018 1000   MCH 29.8 09/08/2016 1035   MCHC 35.4 01/30/2018 1000   MCHC 32.5 09/08/2016 1035   RDW 14.8 01/30/2018 1000   LYMPHSABS 1.7 01/30/2018 1000   MONOABS 513 09/08/2016 1035   EOSABS 0.3 01/30/2018 1000   BASOSABS 0.0 01/30/2018 1000   Iron/TIBC/Ferritin/ %Sat    Component Value Date/Time   IRON 79 02/03/2016 1015   TIBC 309 02/03/2016 1015   IRONPCTSAT 26 02/03/2016 1015   Lipid Panel     Component Value Date/Time   CHOL 162 01/02/2019 1258   TRIG 225 (H) 01/02/2019 1258   HDL 37 (L) 01/02/2019 1258   CHOLHDL 3.9 08/03/2018 1209   CHOLHDL 5.3 (H) 09/08/2016 1035   VLDL 46 (H) 09/08/2016 1035   LDLCALC 80 01/02/2019 1258   Hepatic Function Panel     Component Value Date/Time   PROT 7.6 01/02/2019 1258   ALBUMIN 4.3 01/02/2019 1258   AST 23 01/02/2019 1258   ALT 16 01/02/2019 1258   ALKPHOS 108 01/02/2019 1258   BILITOT 0.3 01/02/2019 1258      Component Value Date/Time   TSH 0.958 03/07/2018 1310   TSH 0.874 01/30/2018 1000   TSH 1.060 03/23/2017 1230   Results for BREKLYN, FABRIZIO (MRN [id number]) as of 02/20/2019 07:35  Ref. Range 01/02/2019 12:58  Vitamin D, 25-Hydroxy Latest Ref Range: 30.0 - 100.0 ng/mL 64.9   I, Michaelene Song, am acting as Location manager for [employer], FNP-C.  I have reviewed the above documentation for accuracy and completeness, and I agree with the above.  - Tiearra Colwell, FNP-C.

## 2019-03-05 ENCOUNTER — Other Ambulatory Visit: Payer: Self-pay

## 2019-03-05 ENCOUNTER — Encounter (INDEPENDENT_AMBULATORY_CARE_PROVIDER_SITE_OTHER): Payer: Self-pay | Admitting: Family Medicine

## 2019-03-05 ENCOUNTER — Ambulatory Visit (INDEPENDENT_AMBULATORY_CARE_PROVIDER_SITE_OTHER): Payer: Medicare Other | Admitting: Family Medicine

## 2019-03-05 DIAGNOSIS — E1142 Type 2 diabetes mellitus with diabetic polyneuropathy: Secondary | ICD-10-CM | POA: Diagnosis not present

## 2019-03-05 DIAGNOSIS — Z794 Long term (current) use of insulin: Secondary | ICD-10-CM

## 2019-03-05 DIAGNOSIS — Z6837 Body mass index (BMI) 37.0-37.9, adult: Secondary | ICD-10-CM | POA: Diagnosis not present

## 2019-03-06 ENCOUNTER — Encounter (INDEPENDENT_AMBULATORY_CARE_PROVIDER_SITE_OTHER): Payer: Self-pay | Admitting: Family Medicine

## 2019-03-06 NOTE — Progress Notes (Signed)
Office: [phone]  /  Fax: [phone] TeleHealth Visit:  GLESSIE EUSTICE has verbally consented to this TeleHealth visit today. The patient is located at home, the provider is located at the [employer] [facility] office. The participants in this visit include the listed provider and patient. The visit was conducted today via FaceTime. Length of call was about 23 minutes  HPI:   Chief Complaint: OBESITY Keauna is here to discuss her progress with her obesity treatment plan. She is keeping a food journal with 1200-1300 calories and 85 grams of protein and is following her eating plan approximately 60-70% of the time. She states she is exercising 0 minutes 0 times per week. Malaiya states she weighed 204 lbs today. She does not have much protein at home and cannot shop until 05/13 due to financial reasons. She eats only beef, ham, and pork and reports eating 50 grams of protein daily due to limited protein in her house. We were unable to weigh the patient today for this TeleHealth visit. She feels as if she has lost 2 lbs since her last visit. She has lost 17 lbs since starting treatment with [name].  Diabetes Mellitus with Polyneuropathy, on Insulin Louanna has a diagnosis of diabetes mellitus and is on Levemir 80 units at night and NovoLog 30 units TID. Fedra does not report checking her blood sugars. She has occasional hypoglycemic and takes 2 sugar pills when this happens. Last A1c was 5.1 on 01/02/2019. She has been working on intensive lifestyle modifications including diet, exercise, and weight loss to help control her blood glucose levels.  ASSESSMENT AND PLAN:  Type 2 diabetes mellitus with diabetic polyneuropathy, with long-term current use of insulin (HCC)  Class 2 severe obesity with serious comorbidity and body mass index (BMI) of 37.0 to 37.9 in adult, unspecified obesity type ([place])  PLAN:  Diabetes Mellitus with Polyneuropathy, on Insulin Deztinee has been given extensive  diabetes education by myself today including ideal fasting and post-prandial blood glucose readings, individual ideal HgA1c goals  and hypoglycemia prevention. We discussed the importance of good blood sugar control to decrease the likelihood of diabetic complications such as nephropathy, neuropathy, limb loss, blindness, coronary artery disease, and death. We discussed the importance of intensive lifestyle modification including diet, exercise and weight loss as the first line treatment for diabetes. Alyanah agrees to continue her Levemir and NovoLog and will follow-up at the agreed upon time.  I spent > than 50% of the 15 minute visit on counseling as documented in the note.  Obesity Daphene is currently in the action stage of change. As such, her goal is to continue with weight loss efforts. She has agreed to keep a food journal with 1200-1300 calories and 85+ grams of protein.  We discussed the following Behavioral Modification Strategies today: increasing lean protein intake, planning for success, and keep a strict food journal.  Sue has agreed to follow-up with our clinic in 2 weeks. She was informed of the importance of frequent follow-up visits to maximize her success with intensive lifestyle modifications for her multiple health conditions.  ALLERGIES: Allergies  Allergen Reactions   Codeine Anaphylaxis   Contrast Media [Iodinated Diagnostic Agents] Anaphylaxis   Nitrofurantoin Monohyd Macro Anaphylaxis   Betadine [Povidone Iodine] Itching   Folic Acid Itching   Gabapentin Other (See Comments)    Makes patient feel drunk   Iodine Hives   Lyrica [Pregabalin] Other (See Comments)    Makes patient feel drunk   Red Dye  Itching   Ultram [Tramadol Hcl] Nausea And Vomiting    MEDICATIONS: Current Outpatient Medications on File Prior to Visit  Medication Sig Dispense Refill   acetaminophen (TYLENOL) 325 MG tablet Take 650 mg by mouth every 6 (six) hours as needed (every 6  weeks before RA infusion).     allopurinol (ZYLOPRIM) 100 MG tablet Take 1 tablet (100 mg total) by mouth daily. Ov needed (Patient taking differently: Take 200 mg by mouth daily. ) 90 tablet 3   aspirin 325 MG tablet Take 325 mg by mouth daily.      Blood Glucose Monitoring Suppl (BLOOD GLUCOSE METER KIT AND SUPPLIES) KIT Dispense based on patient and insurance preference. Use up to four times daily as directed. (FOR ICD-9 250.00, 250.01). 1 each 11   Cholecalciferol (VITAMIN D3) 5000 units CAPS Take 1 capsule by mouth daily.     diclofenac sodium (VOLTAREN) 1 % GEL Apply 2 g topically 4 (four) times daily. (Patient taking differently: Apply 2 g topically 2 (two) times daily as needed (pain). ) 100 g 3   diphenhydrAMINE (BENADRYL) 25 MG tablet Take 25 mg by mouth every 6 (six) hours as needed (every 6 weeks prior to RA infusion).     DULoxetine (CYMBALTA) 30 MG capsule TAKE 1 CAPSULE BY MOUTH EVERY DAY 90 capsule 3   escitalopram (LEXAPRO) 20 MG tablet TAKE 1 TABLET BY MOUTH EVERY DAY 90 tablet 1   furosemide (LASIX) 20 MG tablet Take 1-3 tablets (20-60 mg total) by mouth daily. 200 tablet 1   glucose blood test strip Check sugar three times daily  Dx: DMII insulin dependent with retinopathy, neuropathy controlled 300 each 3   HYDROcodone-acetaminophen (NORCO) 10-325 MG tablet Take 1 tablet by mouth every 12 (twelve) hours as needed. 60 tablet 0   insulin aspart (NOVOLOG) 100 UNIT/ML injection Inject 25 Units into the skin 3 (three) times daily with meals. 10 mL 11   Insulin Syringes, Disposable, U-100 0.5 ML MISC 28 Units by Does not apply route 2 (two) times daily. 100 each 11   leflunomide (ARAVA) 20 MG tablet Take 20 mg by mouth daily.      LEVEMIR 100 UNIT/ML injection INJECT 60 UNITS AT BEDTIME AS DIRECTED (Patient taking differently: Inject 90 Units into the skin daily. ) 20 mL 1   linaclotide (LINZESS) 290 MCG CAPS capsule Take 290 mcg by mouth daily before breakfast.      Needles & Syringes MISC 1 Syringe by Does not apply route 2 (two) times daily. 100 each 11   omeprazole (PRILOSEC) 20 MG capsule TAKE 1 CAPSULE BY MOUTH EVERY DAY 90 capsule 3   oxybutynin (DITROPAN XL) 15 MG 24 hr tablet TAKE 1 TABLET BY MOUTH AT BEDTIME 90 tablet 3   predniSONE (DELTASONE) 5 MG tablet Take 5 mg by mouth daily with breakfast. Only takes with RA Flare.     rosuvastatin (CRESTOR) 10 MG tablet Take 1 tablet (10 mg total) by mouth daily. 90 tablet 1   traZODone (DESYREL) 100 MG tablet TAKE 2 TABLETS BY MOUTH EVERY DAY AT BEDTIME 180 tablet 1   ULTICARE INSULIN SYRINGE 31G X 5/16" 0.5 ML MISC USE AS DIRECTED TO INJECT INSULIN 2 TIMES DAILY 100 each 4   No current facility-administered medications on file prior to visit.     PAST MEDICAL HISTORY: Past Medical History:  Diagnosis Date   Allergy    generic allergy pill; Spring and Fall only.   Anxiety    Arthritis  DDD lumbar, R hip OA.  s/p ortho consult in past.   Blood transfusion without reported diagnosis    Mountain climbing accident in [place].   Brachial plexus disorders    Cataract    B retractions.   Chronic kidney disease    stage 3 per pt.    Chronic pain syndrome    Chronic renal insufficiency, stage 3 (moderate) (HCC)    Constipation    DDD (degenerative disc disease), lumbar    Depression    Diabetes mellitus    Diabetic peripheral neuropathy associated with type 2 diabetes mellitus (HCC)    Diabetic retinopathy ([place])    Diabetic retinopathy associated with type 2 diabetes mellitus ([place])    s/p laser treatment multiple.  Unable to drive.   Fatty liver    Fibromyalgia    Food allergy    GERD (gastroesophageal reflux disease)    Hypercholesteremia    Hyperlipidemia    Hypertension    controlled, off meds    IBS (irritable bowel syndrome)    Leg edema    Neuromuscular disorder (HCC)    OSA (obstructive sleep apnea)    Osteoarthritis    Rheumatic fever     Rheumatoid arthritis (HCC)    Stomach ulcer    Swallowing difficulty    TIA (transient ischemic attack)    Ulcer    Peptic ulcer H. Pylori + s/p treatment.  Upper GI diagnosed.[name] Prilosec PRN .    PAST SURGICAL HISTORY: Past Surgical History:  Procedure Laterality Date    2 SPINAL INJECTIONS      ABDOMINAL HYSTERECTOMY  11/02/1979   DUB; cervical dysplasia; ovaries intact.   ABDOMINAL SURGERY     staph abcess    [facility] Admission     age 32; three months in [employer].   BREAST BIOPSY     CARDIAC CATHETERIZATION  11/02/2007   normal coronary arteries.   CARPAL TUNNEL RELEASE     Bilateral.   CATARACT EXTRACTION, BILATERAL     CHOLECYSTECTOMY     ESOPHAGEAL MANOMETRY N/A 09/14/2017   Procedure: ESOPHAGEAL MANOMETRY (EM);  Surgeon: Ronnette Juniper, MD;  Location: [facility];  Service: Gastroenterology;  Laterality: N/A;   EYE SURGERY     Cataracts B. Laser surgery x 7 for Diabetic Retinopathy   TONSILLECTOMY      SOCIAL HISTORY: Social History   Tobacco Use   Smoking status: Former Smoker   Smokeless tobacco: Never Used   Tobacco comment: Quit 1987  Substance Use Topics   Alcohol use: No    Alcohol/week: 0.0 standard drinks   Drug use: No    FAMILY HISTORY: Family History  Adopted: Yes  Family history unknown: Yes   ROS: Review of Systems  Endo/Heme/Allergies:       Positive for occasional hypoglycemia.   PHYSICAL EXAM: Pt in no acute distress  RECENT LABS AND TESTS: BMET    Component Value Date/Time   NA 143 01/02/2019 1258   K 3.9 01/02/2019 1258   CL 100 01/02/2019 1258   CO2 24 01/02/2019 1258   GLUCOSE 38 (LL) 01/02/2019 1258   GLUCOSE 58 (L) 09/08/2016 1035   BUN 31 (H) 01/02/2019 1258   CREATININE 1.43 (H) 01/02/2019 1258   CREATININE 1.30 (H) 09/08/2016 1035   CALCIUM 9.9 01/02/2019 1258   GFRNONAA 38 (L) 01/02/2019 1258   GFRNONAA 36 (L) 07/10/2014 1354   GFRAA 44 (L) 01/02/2019 1258   GFRAA 41 (L)  07/10/2014 1354  Lab Results  Component Value Date   HGBA1C 5.1 01/02/2019   HGBA1C 6.0 (H) 08/03/2018   HGBA1C 8.1 (H) 03/07/2018   HGBA1C 11.0 01/09/2018   HGBA1C 8.0 10/07/2017   Lab Results  Component Value Date   INSULIN 6.2 03/07/2018   CBC    Component Value Date/Time   WBC 5.1 01/30/2018 1000   WBC 5.7 09/08/2016 1035   RBC 3.38 (L) 01/30/2018 1000   RBC 3.82 09/08/2016 1035   HGB 10.9 (L) 01/30/2018 1000   HCT 30.8 (L) 01/30/2018 1000   PLT 226 01/30/2018 1000   MCV 91 01/30/2018 1000   MCH 32.2 01/30/2018 1000   MCH 29.8 09/08/2016 1035   MCHC 35.4 01/30/2018 1000   MCHC 32.5 09/08/2016 1035   RDW 14.8 01/30/2018 1000   LYMPHSABS 1.7 01/30/2018 1000   MONOABS 513 09/08/2016 1035   EOSABS 0.3 01/30/2018 1000   BASOSABS 0.0 01/30/2018 1000   Iron/TIBC/Ferritin/ %Sat    Component Value Date/Time   IRON 79 02/03/2016 1015   TIBC 309 02/03/2016 1015   IRONPCTSAT 26 02/03/2016 1015   Lipid Panel     Component Value Date/Time   CHOL 162 01/02/2019 1258   TRIG 225 (H) 01/02/2019 1258   HDL 37 (L) 01/02/2019 1258   CHOLHDL 3.9 08/03/2018 1209   CHOLHDL 5.3 (H) 09/08/2016 1035   VLDL 46 (H) 09/08/2016 1035   LDLCALC 80 01/02/2019 1258   Hepatic Function Panel     Component Value Date/Time   PROT 7.6 01/02/2019 1258   ALBUMIN 4.3 01/02/2019 1258   AST 23 01/02/2019 1258   ALT 16 01/02/2019 1258   ALKPHOS 108 01/02/2019 1258   BILITOT 0.3 01/02/2019 1258      Component Value Date/Time   TSH 0.958 03/07/2018 1310   TSH 0.874 01/30/2018 1000   TSH 1.060 03/23/2017 1230   Results for THURZA, KWIECINSKI (MRN [id number]) as of 03/06/2019 09:47  Ref. Range 01/02/2019 12:58  Vitamin D, 25-Hydroxy Latest Ref Range: 30.0 - 100.0 ng/mL 64.9   I, Michaelene Song, am acting as Location manager for [employer], FNP-C.  I have reviewed the above documentation for accuracy and completeness, and I agree with the above.  - Riordan Walle, FNP-C.

## 2019-03-13 DIAGNOSIS — M0609 Rheumatoid arthritis without rheumatoid factor, multiple sites: Secondary | ICD-10-CM | POA: Diagnosis not present

## 2019-03-20 ENCOUNTER — Encounter (INDEPENDENT_AMBULATORY_CARE_PROVIDER_SITE_OTHER): Payer: Self-pay | Admitting: Family Medicine

## 2019-03-20 ENCOUNTER — Other Ambulatory Visit: Payer: Self-pay

## 2019-03-20 ENCOUNTER — Ambulatory Visit (INDEPENDENT_AMBULATORY_CARE_PROVIDER_SITE_OTHER): Payer: Medicare Other | Admitting: Family Medicine

## 2019-03-20 DIAGNOSIS — E1142 Type 2 diabetes mellitus with diabetic polyneuropathy: Secondary | ICD-10-CM | POA: Diagnosis not present

## 2019-03-20 DIAGNOSIS — Z794 Long term (current) use of insulin: Secondary | ICD-10-CM

## 2019-03-20 DIAGNOSIS — Z6837 Body mass index (BMI) 37.0-37.9, adult: Secondary | ICD-10-CM | POA: Diagnosis not present

## 2019-03-20 NOTE — Progress Notes (Signed)
Office: [phone]  /  Fax: [phone] TeleHealth Visit:  SUMAIYA ARRUDA has verbally consented to this TeleHealth visit today. The patient is located at home, the provider is located at the [facility] office. The participants in this visit include the listed provider and patient. The visit was conducted today via FaceTime.  HPI:   Chief Complaint: OBESITY Angel French is here to discuss her progress with her obesity treatment plan. She is keeping a food journal with 1200-1300 calories and 85+ grams of protein and is following her eating plan approximately 50% of the time. She states she is exercising 0 minutes 0 times per week. Valerie states she has been on prednisone recently for a flair of her severe right hip osteoarthritis.and has gained weight. She states her appetite has been increased due to the prednisone.  She reports supplementing her protein intake with Premier shakes. She is not eating meat due to fear of contamination due to COVID-19. However she reports she has always been mostly vegetarian. She reports getting 50-55 grams of protein daily.  We were unable to weigh the patient today for this TeleHealth visit. She feels as if she has gained 3 lbs since her last visit. She has lost 17 lbs since starting treatment with [name].  Diabetes Mellitus with Polyneuropathy, on Insulin Florene has a diagnosis of diabetes mellitus and is well controlled. Madisyn states blood sugars are less than 150 in the a.m. Her 2-hour postprandials are increased but still less than 160 due to being on prednisone. Last A1c was 5.1 on 01/02/2019. Sapphire reports taking some extra NovoLog (33 units TID) to cover for higher CBGs. She has been working on intensive lifestyle modifications including diet, exercise, and weight loss to help control her blood glucose levels.  ASSESSMENT AND PLAN:  Type 2 diabetes mellitus with diabetic polyneuropathy, with long-term current use of insulin (HCC)  Class 2  severe obesity with serious comorbidity and body mass index (BMI) of 37.0 to 37.9 in adult, unspecified obesity type ([place])  PLAN:  Diabetes Mellitus with Polyneuropathy, on Insulin Coriana has been given extensive diabetes education by myself today including ideal fasting and post-prandial blood glucose readings, individual ideal Hgb A1c goals  and hypoglycemia prevention. We discussed the importance of good blood sugar control to decrease the likelihood of diabetic complications such as nephropathy, neuropathy, limb loss, blindness, coronary artery disease, and death. We discussed the importance of intensive lifestyle modification including diet, exercise and weight loss as the first line treatment for diabetes. Tishie will continue her meal plan and medications. She will follow-up at the agreed upon time.  I spent > than 50% of the 15 minute visit on counseling as documented in the note.  Obesity Ariela is currently in the action stage of change. As such, her goal is to continue with weight loss efforts. She has agreed to keep a food journal with 1200-1300 calories and 85 grams of protein daily. Handouts were sent to the patient via MyChart on [facility] (for ideas). Khadijah has not been prescribed exercise at this time. We discussed the following Behavioral Modification Strategies today: increasing lean protein intake and planning for success.  Asjia has agreed to follow-up with our clinic in 2-3 weeks. She was informed of the importance of frequent follow-up visits to maximize her success with intensive lifestyle modifications for her multiple health conditions.  ALLERGIES: Allergies  Allergen Reactions  . Codeine Anaphylaxis  . Contrast Media [Iodinated Diagnostic Agents] Anaphylaxis  . Nitrofurantoin Monohyd  Macro Anaphylaxis  . Betadine [Povidone Iodine] Itching  . Folic Acid Itching  . Gabapentin Other (See Comments)    Makes patient feel drunk  . Iodine Hives  . Lyrica  [Pregabalin] Other (See Comments)    Makes patient feel drunk  . Red Dye Itching  . Ultram [Tramadol Hcl] Nausea And Vomiting    MEDICATIONS: Current Outpatient Medications on File Prior to Visit  Medication Sig Dispense Refill  . acetaminophen (TYLENOL) 325 MG tablet Take 650 mg by mouth every 6 (six) hours as needed (every 6 weeks before RA infusion).    Marland Kitchen allopurinol (ZYLOPRIM) 100 MG tablet Take 1 tablet (100 mg total) by mouth daily. Ov needed (Patient taking differently: Take 200 mg by mouth daily. ) 90 tablet 3  . aspirin 325 MG tablet Take 325 mg by mouth daily.     . Blood Glucose Monitoring Suppl (BLOOD GLUCOSE METER KIT AND SUPPLIES) KIT Dispense based on patient and insurance preference. Use up to four times daily as directed. (FOR ICD-9 250.00, 250.01). 1 each 11  . Cholecalciferol (VITAMIN D3) 5000 units CAPS Take 1 capsule by mouth daily.    . diclofenac sodium (VOLTAREN) 1 % GEL Apply 2 g topically 4 (four) times daily. (Patient taking differently: Apply 2 g topically 2 (two) times daily as needed (pain). ) 100 g 3  . diphenhydrAMINE (BENADRYL) 25 MG tablet Take 25 mg by mouth every 6 (six) hours as needed (every 6 weeks prior to RA infusion).    . DULoxetine (CYMBALTA) 30 MG capsule TAKE 1 CAPSULE BY MOUTH EVERY DAY 90 capsule 3  . escitalopram (LEXAPRO) 20 MG tablet TAKE 1 TABLET BY MOUTH EVERY DAY 90 tablet 1  . furosemide (LASIX) 20 MG tablet Take 1-3 tablets (20-60 mg total) by mouth daily. 200 tablet 1  . glucose blood test strip Check sugar three times daily  Dx: DMII insulin dependent with retinopathy, neuropathy controlled 300 each 3  . HYDROcodone-acetaminophen (NORCO) 10-325 MG tablet Take 1 tablet by mouth every 12 (twelve) hours as needed. 60 tablet 0  . insulin aspart (NOVOLOG) 100 UNIT/ML injection Inject 25 Units into the skin 3 (three) times daily with meals. 10 mL 11  . Insulin Syringes, Disposable, U-100 0.5 ML MISC 28 Units by Does not apply route 2 (two)  times daily. 100 each 11  . leflunomide (ARAVA) 20 MG tablet Take 20 mg by mouth daily.     Marland Kitchen LEVEMIR 100 UNIT/ML injection INJECT 60 UNITS AT BEDTIME AS DIRECTED (Patient taking differently: Inject 90 Units into the skin daily. ) 20 mL 1  . linaclotide (LINZESS) 290 MCG CAPS capsule Take 290 mcg by mouth daily before breakfast.    . Needles & Syringes MISC 1 Syringe by Does not apply route 2 (two) times daily. 100 each 11  . omeprazole (PRILOSEC) 20 MG capsule TAKE 1 CAPSULE BY MOUTH EVERY DAY 90 capsule 3  . oxybutynin (DITROPAN XL) 15 MG 24 hr tablet TAKE 1 TABLET BY MOUTH AT BEDTIME 90 tablet 3  . predniSONE (DELTASONE) 5 MG tablet Take 5 mg by mouth daily with breakfast. Only takes with RA Flare.    . rosuvastatin (CRESTOR) 10 MG tablet Take 1 tablet (10 mg total) by mouth daily. 90 tablet 1  . traZODone (DESYREL) 100 MG tablet TAKE 2 TABLETS BY MOUTH EVERY DAY AT BEDTIME 180 tablet 1  . ULTICARE INSULIN SYRINGE 31G X 5/16" 0.5 ML MISC USE AS DIRECTED TO INJECT INSULIN 2 TIMES  DAILY 100 each 4   No current facility-administered medications on file prior to visit.     PAST MEDICAL HISTORY: Past Medical History:  Diagnosis Date  . Allergy    generic allergy pill; Spring and Fall only.  . Anxiety   . Arthritis    DDD lumbar, R hip OA.  s/p ortho consult in past.  . Blood transfusion without reported diagnosis    Mountain climbing accident in [place].  . Brachial plexus disorders   . Cataract    B retractions.  . Chronic kidney disease    stage 3 per pt.   . Chronic pain syndrome   . Chronic renal insufficiency, stage 3 (moderate) (HCC)   . Constipation   . DDD (degenerative disc disease), lumbar   . Depression   . Diabetes mellitus   . Diabetic peripheral neuropathy associated with type 2 diabetes mellitus ([place])   . Diabetic retinopathy ([place])   . Diabetic retinopathy associated with type 2 diabetes mellitus ([place])    s/p laser treatment multiple.  Unable to drive.  . Fatty  liver   . Fibromyalgia   . Food allergy   . GERD (gastroesophageal reflux disease)   . Hypercholesteremia   . Hyperlipidemia   . Hypertension    controlled, off meds   . IBS (irritable bowel syndrome)   . Leg edema   . Neuromuscular disorder ([place])   . OSA (obstructive sleep apnea)   . Osteoarthritis   . Rheumatic fever   . Rheumatoid arthritis ([place])   . Stomach ulcer   . Swallowing difficulty   . TIA (transient ischemic attack)   . Ulcer    Peptic ulcer H. Pylori + s/p treatment.  Upper GI diagnosed.[name] Prilosec PRN .    PAST SURGICAL HISTORY: Past Surgical History:  Procedure Laterality Date  .  2 SPINAL INJECTIONS     . ABDOMINAL HYSTERECTOMY  11/02/1979   DUB; cervical dysplasia; ovaries intact.  . ABDOMINAL SURGERY     staph abcess   . [facility] Admission     age 46; three months in [place].  Marland Kitchen BREAST BIOPSY    . CARDIAC CATHETERIZATION  11/02/2007   normal coronary arteries.  . CARPAL TUNNEL RELEASE     Bilateral.  . CATARACT EXTRACTION, BILATERAL    . CHOLECYSTECTOMY    . ESOPHAGEAL MANOMETRY N/A 09/14/2017   Procedure: ESOPHAGEAL MANOMETRY (EM);  Surgeon: Ronnette Juniper, MD;  Location: [facility];  Service: Gastroenterology;  Laterality: N/A;  . EYE SURGERY     Cataracts B. Laser surgery x 7 for Diabetic Retinopathy  . TONSILLECTOMY      SOCIAL HISTORY: Social History   Tobacco Use  . Smoking status: Former Research scientist (life sciences)  . Smokeless tobacco: Never Used  . Tobacco comment: Quit 1987  Substance Use Topics  . Alcohol use: No    Alcohol/week: 0.0 standard drinks  . Drug use: No    FAMILY HISTORY: Family History  Adopted: Yes  Family history unknown: Yes   ROS: ROS none noted.  PHYSICAL EXAM: Pt in no acute distress  RECENT LABS AND TESTS: BMET    Component Value Date/Time   NA 143 01/02/2019 1258   K 3.9 01/02/2019 1258   CL 100 01/02/2019 1258   CO2 24 01/02/2019 1258   GLUCOSE 38 (LL) 01/02/2019 1258   GLUCOSE 58 (L) 09/08/2016  1035   BUN 31 (H) 01/02/2019 1258   CREATININE 1.43 (H) 01/02/2019 1258   CREATININE 1.30 (H)  09/08/2016 1035   CALCIUM 9.9 01/02/2019 1258   GFRNONAA 38 (L) 01/02/2019 1258   GFRNONAA 36 (L) 07/10/2014 1354   GFRAA 44 (L) 01/02/2019 1258   GFRAA 41 (L) 07/10/2014 1354   Lab Results  Component Value Date   HGBA1C 5.1 01/02/2019   HGBA1C 6.0 (H) 08/03/2018   HGBA1C 8.1 (H) 03/07/2018   HGBA1C 11.0 01/09/2018   HGBA1C 8.0 10/07/2017   Lab Results  Component Value Date   INSULIN 6.2 03/07/2018   CBC    Component Value Date/Time   WBC 5.1 01/30/2018 1000   WBC 5.7 09/08/2016 1035   RBC 3.38 (L) 01/30/2018 1000   RBC 3.82 09/08/2016 1035   HGB 10.9 (L) 01/30/2018 1000   HCT 30.8 (L) 01/30/2018 1000   PLT 226 01/30/2018 1000   MCV 91 01/30/2018 1000   MCH 32.2 01/30/2018 1000   MCH 29.8 09/08/2016 1035   MCHC 35.4 01/30/2018 1000   MCHC 32.5 09/08/2016 1035   RDW 14.8 01/30/2018 1000   LYMPHSABS 1.7 01/30/2018 1000   MONOABS 513 09/08/2016 1035   EOSABS 0.3 01/30/2018 1000   BASOSABS 0.0 01/30/2018 1000   Iron/TIBC/Ferritin/ %Sat    Component Value Date/Time   IRON 79 02/03/2016 1015   TIBC 309 02/03/2016 1015   IRONPCTSAT 26 02/03/2016 1015   Lipid Panel     Component Value Date/Time   CHOL 162 01/02/2019 1258   TRIG 225 (H) 01/02/2019 1258   HDL 37 (L) 01/02/2019 1258   CHOLHDL 3.9 08/03/2018 1209   CHOLHDL 5.3 (H) 09/08/2016 1035   VLDL 46 (H) 09/08/2016 1035   LDLCALC 80 01/02/2019 1258   Hepatic Function Panel     Component Value Date/Time   PROT 7.6 01/02/2019 1258   ALBUMIN 4.3 01/02/2019 1258   AST 23 01/02/2019 1258   ALT 16 01/02/2019 1258   ALKPHOS 108 01/02/2019 1258   BILITOT 0.3 01/02/2019 1258      Component Value Date/Time   TSH 0.958 03/07/2018 1310   TSH 0.874 01/30/2018 1000   TSH 1.060 03/23/2017 1230   Results for AYN, DOMANGUE (MRN [id number]) as of 03/20/2019 12:57  Ref. Range 01/02/2019 12:58  Vitamin D, 25-Hydroxy Latest  Ref Range: 30.0 - 100.0 ng/mL 64.9   I, Michaelene Song, am acting as Location manager for [employer], FNP-C.  I have reviewed the above documentation for accuracy and completeness, and I agree with the above.  - Amari Zagal, FNP-C.

## 2019-03-22 ENCOUNTER — Encounter (INDEPENDENT_AMBULATORY_CARE_PROVIDER_SITE_OTHER): Payer: Self-pay | Admitting: Family Medicine

## 2019-04-02 ENCOUNTER — Telehealth: Payer: Self-pay | Admitting: Adult Health

## 2019-04-02 NOTE — Telephone Encounter (Signed)
Pt has a appt June 11 at [facility] and wants to know if it can be converted to a video visit she states she has been isolated since March and she is terrified to come out

## 2019-04-02 NOTE — Telephone Encounter (Signed)
Spoke to pt and she consented to doxy.me VV.Due to current COVID 19 pandemic, our office is severely reducing in office visits until further notice, in order to minimize the risk to our patients and healthcare providers.  Pt understands that although there may be some limitations with this type of visit, we will take all precautions to reduce any security or privacy concerns.  Pt understands that this will be treated like an in office visit and we will file with pt's insurance, and there may be a patient responsible charge related to this service.  email sent to [email or web address].

## 2019-04-03 ENCOUNTER — Encounter (INDEPENDENT_AMBULATORY_CARE_PROVIDER_SITE_OTHER): Payer: Self-pay | Admitting: Family Medicine

## 2019-04-03 ENCOUNTER — Ambulatory Visit (INDEPENDENT_AMBULATORY_CARE_PROVIDER_SITE_OTHER): Payer: Medicare Other | Admitting: Family Medicine

## 2019-04-03 ENCOUNTER — Other Ambulatory Visit: Payer: Self-pay

## 2019-04-03 DIAGNOSIS — Z6838 Body mass index (BMI) 38.0-38.9, adult: Secondary | ICD-10-CM | POA: Diagnosis not present

## 2019-04-03 DIAGNOSIS — E1142 Type 2 diabetes mellitus with diabetic polyneuropathy: Secondary | ICD-10-CM | POA: Diagnosis not present

## 2019-04-03 DIAGNOSIS — F3289 Other specified depressive episodes: Secondary | ICD-10-CM | POA: Diagnosis not present

## 2019-04-03 DIAGNOSIS — Z794 Long term (current) use of insulin: Secondary | ICD-10-CM

## 2019-04-04 ENCOUNTER — Encounter (INDEPENDENT_AMBULATORY_CARE_PROVIDER_SITE_OTHER): Payer: Self-pay | Admitting: Family Medicine

## 2019-04-04 NOTE — Progress Notes (Signed)
Office: [phone]  /  Fax: [phone] TeleHealth Visit:  Angel French has verbally consented to this TeleHealth visit today. The patient is located at home, the provider is located at the [employer] [facility] office. The participants in this visit include the listed provider and patient. The visit was conducted today via FaceTime.  HPI:   Chief Complaint: OBESITY Glenette is here to discuss her progress with her obesity treatment plan. She is keeping a food journal with 1200-1300 calories and 85 grams of protein daily and is following her eating plan approximately 85-90% of the time. She states she is exercising 0 minutes 0 times per week. Tammara reports her weight today to be 206 lbs and has lost 1 lb. She is trying to increase protein and supplementing with protein shakes. She is getting 50-55 grams a day. She admits to eating some chips. She sometimes exceeds her calories. She also reports sometimes having an extra peanut butter sandwich. We were unable to weigh the patient today for this TeleHealth visit. She reports her weight at home today to be 206 lbs. She has lost 17 lbs since starting treatment with [name].  Diabetes Mellitus with Polyneuropathy, on Insulin Crestina has a diagnosis of diabetes mellitus with polyneuropathy, on insulin, well controlled on Levemir and NovoLog. Lateasha states fasting CBG's average 120's and 2-hour postprandials range between 130 and 140. She denies any hypoglycemic episodes. Last A1c was 5.1 on 01/02/2019. She has been working on intensive lifestyle modifications including diet, exercise, and weight loss to help control her blood glucose levels.  Depression with emotional eating behaviors Surena is struggling with emotional eating and using food for comfort to the extent that it is negatively impacting her health. She often snacks when she is not hungry. Birdell sometimes feels she is out of control and then feels guilty that she made poor food choices.  She has been working on behavior modification techniques to help reduce her emotional eating and has been somewhat successful. Teya reports her daughter is causing her a great deal of stress because she is obsessed with COVID precautions. Clorene is unable to go anywhere and is very stressed. She shows no sign of suicidal or homicidal ideations.  Depression screen [facility] 2/9 09/05/2018 08/17/2018 08/03/2018 07/19/2018 07/05/2018  Decreased Interest 0 0 0 0 0  Down, Depressed, Hopeless 0 0 0 0 1  PHQ - 2 Score 0 0 0 0 1  Altered sleeping 0 0 0 0 1  Tired, decreased energy 0 0 0 1 1  Change in appetite 0 0 0 0 1  Feeling bad or failure about yourself  0 0 0 0 0  Trouble concentrating 0 0 0 0 0  Moving slowly or fidgety/restless 0 0 0 0 0  Suicidal thoughts 0 0 0 0 0  PHQ-9 Score 0 0 0 1 4  Difficult doing work/chores - - - - -  Some recent data might be hidden   ASSESSMENT AND PLAN:  Type 2 diabetes mellitus with diabetic polyneuropathy, with long-term current use of insulin (HCC)  Other depression - with emotional eating   Class 2 severe obesity with serious comorbidity and body mass index (BMI) of 38.0 to 38.9 in adult, unspecified obesity type (HCC)  PLAN:  Diabetes Mellitus with Polyneuropathy, on Insulin Carmelle has been given extensive diabetes education by myself today including ideal fasting and post-prandial blood glucose readings, individual ideal HgA1c goals  and hypoglycemia prevention. We discussed the importance of good blood sugar  control to decrease the likelihood of diabetic complications such as nephropathy, neuropathy, limb loss, blindness, coronary artery disease, and death. We discussed the importance of intensive lifestyle modification including diet, exercise and weight loss as the first line treatment for diabetes. Dolce agrees to continue her diabetes medications and will follow-up at the agreed upon time.  Depression with Emotional Eating Behaviors We discussed  behavior modification techniques today to help Elyn deal with her emotional eating and depression. We discussed bupropion but she gained weight on this in the past. We also discussed emotional eating strategies.  I spent > than 50% of the 15 minute visit on counseling as documented in the note.  Obesity Charnae is currently in the action stage of change. As such, her goal is to continue with weight loss efforts. She has agreed to keep a food journal with 1200-1300 calories and 85 grams of protein daily. Louiza has been instructed to walk more. We discussed the following Behavioral Modification Strategies today: increasing lean protein intake, decreasing simple carbohydrates, keeping healthy foods in the home, and planning for success.  Lisandra has agreed to follow-up with our clinic in 2 weeks. She was informed of the importance of frequent follow-up visits to maximize her success with intensive lifestyle modifications for her multiple health conditions.  ALLERGIES: Allergies  Allergen Reactions   Codeine Anaphylaxis   Contrast Media [Iodinated Diagnostic Agents] Anaphylaxis   Nitrofurantoin Monohyd Macro Anaphylaxis   Betadine [Povidone Iodine] Itching   Folic Acid Itching   Gabapentin Other (See Comments)    Makes patient feel drunk   Iodine Hives   Lyrica [Pregabalin] Other (See Comments)    Makes patient feel drunk   Red Dye Itching   Ultram [Tramadol Hcl] Nausea And Vomiting    MEDICATIONS: Current Outpatient Medications on File Prior to Visit  Medication Sig Dispense Refill   acetaminophen (TYLENOL) 325 MG tablet Take 650 mg by mouth every 6 (six) hours as needed (every 6 weeks before RA infusion).     allopurinol (ZYLOPRIM) 100 MG tablet Take 1 tablet (100 mg total) by mouth daily. Ov needed (Patient taking differently: Take 200 mg by mouth daily. ) 90 tablet 3   aspirin 325 MG tablet Take 325 mg by mouth daily.      Blood Glucose Monitoring Suppl (BLOOD  GLUCOSE METER KIT AND SUPPLIES) KIT Dispense based on patient and insurance preference. Use up to four times daily as directed. (FOR ICD-9 250.00, 250.01). 1 each 11   Cholecalciferol (VITAMIN D3) 5000 units CAPS Take 1 capsule by mouth daily.     diclofenac sodium (VOLTAREN) 1 % GEL Apply 2 g topically 4 (four) times daily. (Patient taking differently: Apply 2 g topically 2 (two) times daily as needed (pain). ) 100 g 3   diphenhydrAMINE (BENADRYL) 25 MG tablet Take 25 mg by mouth every 6 (six) hours as needed (every 6 weeks prior to RA infusion).     DULoxetine (CYMBALTA) 30 MG capsule TAKE 1 CAPSULE BY MOUTH EVERY DAY 90 capsule 3   escitalopram (LEXAPRO) 20 MG tablet TAKE 1 TABLET BY MOUTH EVERY DAY 90 tablet 1   furosemide (LASIX) 20 MG tablet Take 1-3 tablets (20-60 mg total) by mouth daily. 200 tablet 1   glucose blood test strip Check sugar three times daily  Dx: DMII insulin dependent with retinopathy, neuropathy controlled 300 each 3   HYDROcodone-acetaminophen (NORCO) 10-325 MG tablet Take 1 tablet by mouth every 12 (twelve) hours as needed. 60 tablet 0  insulin aspart (NOVOLOG) 100 UNIT/ML injection Inject 25 Units into the skin 3 (three) times daily with meals. 10 mL 11   Insulin Syringes, Disposable, U-100 0.5 ML MISC 28 Units by Does not apply route 2 (two) times daily. 100 each 11   leflunomide (ARAVA) 20 MG tablet Take 20 mg by mouth daily.      LEVEMIR 100 UNIT/ML injection INJECT 60 UNITS AT BEDTIME AS DIRECTED (Patient taking differently: Inject 90 Units into the skin daily. ) 20 mL 1   linaclotide (LINZESS) 290 MCG CAPS capsule Take 290 mcg by mouth daily before breakfast.     Needles & Syringes MISC 1 Syringe by Does not apply route 2 (two) times daily. 100 each 11   omeprazole (PRILOSEC) 20 MG capsule TAKE 1 CAPSULE BY MOUTH EVERY DAY 90 capsule 3   oxybutynin (DITROPAN XL) 15 MG 24 hr tablet TAKE 1 TABLET BY MOUTH AT BEDTIME 90 tablet 3   predniSONE  (DELTASONE) 5 MG tablet Take 5 mg by mouth daily with breakfast. Only takes with RA Flare.     rosuvastatin (CRESTOR) 10 MG tablet Take 1 tablet (10 mg total) by mouth daily. 90 tablet 1   traZODone (DESYREL) 100 MG tablet TAKE 2 TABLETS BY MOUTH EVERY DAY AT BEDTIME 180 tablet 1   ULTICARE INSULIN SYRINGE 31G X 5/16" 0.5 ML MISC USE AS DIRECTED TO INJECT INSULIN 2 TIMES DAILY 100 each 4   No current facility-administered medications on file prior to visit.     PAST MEDICAL HISTORY: Past Medical History:  Diagnosis Date   Allergy    generic allergy pill; Spring and Fall only.   Anxiety    Arthritis    DDD lumbar, R hip OA.  s/p ortho consult in past.   Blood transfusion without reported diagnosis    Mountain climbing accident in [place].   Brachial plexus disorders    Cataract    B retractions.   Chronic kidney disease    stage 3 per pt.    Chronic pain syndrome    Chronic renal insufficiency, stage 3 (moderate) (HCC)    Constipation    DDD (degenerative disc disease), lumbar    Depression    Diabetes mellitus    Diabetic peripheral neuropathy associated with type 2 diabetes mellitus (HCC)    Diabetic retinopathy ([place])    Diabetic retinopathy associated with type 2 diabetes mellitus ([place])    s/p laser treatment multiple.  Unable to drive.   Fatty liver    Fibromyalgia    Food allergy    GERD (gastroesophageal reflux disease)    Hypercholesteremia    Hyperlipidemia    Hypertension    controlled, off meds    IBS (irritable bowel syndrome)    Leg edema    Neuromuscular disorder (HCC)    OSA (obstructive sleep apnea)    Osteoarthritis    Rheumatic fever    Rheumatoid arthritis (HCC)    Stomach ulcer    Swallowing difficulty    TIA (transient ischemic attack)    Ulcer    Peptic ulcer H. Pylori + s/p treatment.  Upper GI diagnosed.[name] Prilosec PRN .    PAST SURGICAL HISTORY: Past Surgical History:  Procedure Laterality Date      2 SPINAL INJECTIONS      ABDOMINAL HYSTERECTOMY  11/02/1979   DUB; cervical dysplasia; ovaries intact.   ABDOMINAL SURGERY     staph abcess    Behavioral [facility] Admission     age  17; three months in [place].   BREAST BIOPSY     CARDIAC CATHETERIZATION  11/02/2007   normal coronary arteries.   CARPAL TUNNEL RELEASE     Bilateral.   CATARACT EXTRACTION, BILATERAL     CHOLECYSTECTOMY     ESOPHAGEAL MANOMETRY N/A 09/14/2017   Procedure: ESOPHAGEAL MANOMETRY (EM);  Surgeon: Ronnette Juniper, MD;  Location: [facility];  Service: Gastroenterology;  Laterality: N/A;   EYE SURGERY     Cataracts B. Laser surgery x 7 for Diabetic Retinopathy   TONSILLECTOMY      SOCIAL HISTORY: Social History   Tobacco Use   Smoking status: Former Smoker   Smokeless tobacco: Never Used   Tobacco comment: Quit 1987  Substance Use Topics   Alcohol use: No    Alcohol/week: 0.0 standard drinks   Drug use: No    FAMILY HISTORY: Family History  Adopted: Yes  Family history unknown: Yes   ROS: Review of Systems  Endo/Heme/Allergies:       Negative for hypoglycemia.  Psychiatric/Behavioral: Positive for depression (emotional eating). Negative for suicidal ideas.       Negative for homicidal ideas.   PHYSICAL EXAM: Pt in no acute distress  RECENT LABS AND TESTS: BMET    Component Value Date/Time   NA 143 01/02/2019 1258   K 3.9 01/02/2019 1258   CL 100 01/02/2019 1258   CO2 24 01/02/2019 1258   GLUCOSE 38 (LL) 01/02/2019 1258   GLUCOSE 58 (L) 09/08/2016 1035   BUN 31 (H) 01/02/2019 1258   CREATININE 1.43 (H) 01/02/2019 1258   CREATININE 1.30 (H) 09/08/2016 1035   CALCIUM 9.9 01/02/2019 1258   GFRNONAA 38 (L) 01/02/2019 1258   GFRNONAA 36 (L) 07/10/2014 1354   GFRAA 44 (L) 01/02/2019 1258   GFRAA 41 (L) 07/10/2014 1354   Lab Results  Component Value Date   HGBA1C 5.1 01/02/2019   HGBA1C 6.0 (H) 08/03/2018   HGBA1C 8.1 (H) 03/07/2018   HGBA1C 11.0 01/09/2018    HGBA1C 8.0 10/07/2017   Lab Results  Component Value Date   INSULIN 6.2 03/07/2018   CBC    Component Value Date/Time   WBC 5.1 01/30/2018 1000   WBC 5.7 09/08/2016 1035   RBC 3.38 (L) 01/30/2018 1000   RBC 3.82 09/08/2016 1035   HGB 10.9 (L) 01/30/2018 1000   HCT 30.8 (L) 01/30/2018 1000   PLT 226 01/30/2018 1000   MCV 91 01/30/2018 1000   MCH 32.2 01/30/2018 1000   MCH 29.8 09/08/2016 1035   MCHC 35.4 01/30/2018 1000   MCHC 32.5 09/08/2016 1035   RDW 14.8 01/30/2018 1000   LYMPHSABS 1.7 01/30/2018 1000   MONOABS 513 09/08/2016 1035   EOSABS 0.3 01/30/2018 1000   BASOSABS 0.0 01/30/2018 1000   Iron/TIBC/Ferritin/ %Sat    Component Value Date/Time   IRON 79 02/03/2016 1015   TIBC 309 02/03/2016 1015   IRONPCTSAT 26 02/03/2016 1015   Lipid Panel     Component Value Date/Time   CHOL 162 01/02/2019 1258   TRIG 225 (H) 01/02/2019 1258   HDL 37 (L) 01/02/2019 1258   CHOLHDL 3.9 08/03/2018 1209   CHOLHDL 5.3 (H) 09/08/2016 1035   VLDL 46 (H) 09/08/2016 1035   LDLCALC 80 01/02/2019 1258   Hepatic Function Panel     Component Value Date/Time   PROT 7.6 01/02/2019 1258   ALBUMIN 4.3 01/02/2019 1258   AST 23 01/02/2019 1258   ALT 16 01/02/2019 1258   ALKPHOS 108 01/02/2019  1258   BILITOT 0.3 01/02/2019 1258      Component Value Date/Time   TSH 0.958 03/07/2018 1310   TSH 0.874 01/30/2018 1000   TSH 1.060 03/23/2017 1230   Results for AMIA, RYNDERS (MRN [id number]) as of 04/04/2019 09:12  Ref. Range 01/02/2019 12:58  Vitamin D, 25-Hydroxy Latest Ref Range: 30.0 - 100.0 ng/mL 64.9    I, Michaelene Song, am acting as Location manager for [employer], FNP-C.  I have reviewed the above documentation for accuracy and completeness, and I agree with the above.  - Fermin Yan, FNP-C.

## 2019-04-12 ENCOUNTER — Encounter (INDEPENDENT_AMBULATORY_CARE_PROVIDER_SITE_OTHER): Payer: Medicare Other | Admitting: Adult Health

## 2019-04-12 ENCOUNTER — Other Ambulatory Visit: Payer: Self-pay

## 2019-04-12 ENCOUNTER — Telehealth: Payer: Self-pay | Admitting: Adult Health

## 2019-04-12 DIAGNOSIS — G4733 Obstructive sleep apnea (adult) (pediatric): Secondary | ICD-10-CM

## 2019-04-12 DIAGNOSIS — Z0289 Encounter for other administrative examinations: Secondary | ICD-10-CM

## 2019-04-12 DIAGNOSIS — Z9989 Dependence on other enabling machines and devices: Secondary | ICD-10-CM

## 2019-04-12 NOTE — Progress Notes (Signed)
This encounter was created in error - please disregard.

## 2019-04-12 NOTE — Telephone Encounter (Signed)
I called the patient and left a message.  She is scheduled for a video visit at 9 AM.  Advised that if she could log on we can still have a visit if not we would need to reschedule.

## 2019-04-17 ENCOUNTER — Ambulatory Visit (INDEPENDENT_AMBULATORY_CARE_PROVIDER_SITE_OTHER): Payer: Medicare Other | Admitting: Family Medicine

## 2019-04-17 ENCOUNTER — Encounter (INDEPENDENT_AMBULATORY_CARE_PROVIDER_SITE_OTHER): Payer: Self-pay | Admitting: Family Medicine

## 2019-04-17 ENCOUNTER — Other Ambulatory Visit: Payer: Self-pay

## 2019-04-17 DIAGNOSIS — Z794 Long term (current) use of insulin: Secondary | ICD-10-CM | POA: Diagnosis not present

## 2019-04-17 DIAGNOSIS — F3289 Other specified depressive episodes: Secondary | ICD-10-CM | POA: Diagnosis not present

## 2019-04-17 DIAGNOSIS — Z6837 Body mass index (BMI) 37.0-37.9, adult: Secondary | ICD-10-CM

## 2019-04-17 DIAGNOSIS — E1142 Type 2 diabetes mellitus with diabetic polyneuropathy: Secondary | ICD-10-CM | POA: Diagnosis not present

## 2019-04-17 NOTE — Progress Notes (Signed)
Office: [phone]  /  Fax: [phone] TeleHealth Visit:  Angel French has verbally consented to this TeleHealth visit today. The patient is located at home, the provider is located at the [facility] office. The participants in this visit include the listed provider and patient. The visit was conducted today via FaceTime.  HPI:   Chief Complaint: OBESITY Angel French is here to discuss her progress with her obesity treatment plan. She is keeping a food journal with 1200-1300 calories and 85 grams of protein and is following her eating plan approximately 85% of the time. She states she is walking dogs 30 minutes 7 times per week (has arthritis in her leg and uses prednisone prn with flare-up). Angel French has had increased stress due to a recent death in her family by suicide and has been off plan. She has been eating cake for breakfast and has done a lot of baking recently. She plans on stopping the extra carbs on Friday because she will be seeing her PCP that day. She reports only getting 40-50 grams of protein daily.  We were unable to weigh the patient today for this TeleHealth visit. She feels as if she has gained weight since her last visit. She has lost 17 lbs since starting treatment with [name].  Diabetes Mellitus with Polyneuropathy with Insulin Angel French has a diagnosis of diabetes mellitus, well controlled on NovoLog and Levemir. Angel French states CBG's have been increased secondary to prednisone taper, increased simple carbs,  and stress (low 100's in a.m. and up to 175 in the evening). Last A1c was 5.1 on 01/02/2019. She has been working on intensive lifestyle modifications including diet, exercise, and weight loss to help control her blood glucose levels.  Depression with emotional eating behaviors Angel French is struggling with emotional eating and using food for comfort to the extent that it is negatively impacting her health. She often snacks when she is not hungry. Angel French sometimes  feels she is out of control and then feels guilty that she made poor food choices.  Angel French has had a recent increase in emotional eating due to a death in her family. She shows no sign of suicidal or homicidal ideations.  Depression screen Angel French  Decreased Interest 0 0 0 0 0  Down, Depressed, Hopeless 0 0 0 0 1  PHQ - 2 Score 0 0 0 0 1  Altered sleeping 0 0 0 0 1  Tired, decreased energy 0 0 0 1 1  Change in appetite 0 0 0 0 1  Feeling bad or failure about yourself  0 0 0 0 0  Trouble concentrating 0 0 0 0 0  Moving slowly or fidgety/restless 0 0 0 0 0  Suicidal thoughts 0 0 0 0 0  PHQ-9 Score 0 0 0 1 4  Difficult doing work/chores - - - - -  Some recent data might be hidden   ASSESSMENT AND PLAN:  Type 2 diabetes mellitus with diabetic polyneuropathy, with long-term current use of insulin (HCC)  Other depression  Class 2 severe obesity with serious comorbidity and body mass index (BMI) of 37.0 to 37.9 in adult, unspecified obesity type (HCC)  PLAN:  Diabetes Mellitus with Polyneuropathy with Insulin Angel French has been given extensive diabetes education by myself today including ideal fasting and post-prandial blood glucose readings, individual ideal HgA1c goals  and hypoglycemia prevention. We discussed the importance of good blood sugar control to decrease the likelihood of diabetic complications such as  nephropathy, neuropathy, limb loss, blindness, coronary artery disease, and death. We discussed the importance of intensive lifestyle modification including diet, exercise and weight loss as the first line treatment for diabetes. Angel French agrees to continue her diabetes medications and will follow-up at the agreed upon time.  Depression with Emotional Eating Behaviors We discussed behavior modification techniques today to help Angel French deal with her emotional eating and depression. Angel French plans to stop eating baked goods on Friday when she  has a PCP appointment.   Obesity Angel French is currently in the action stage of change. As such, her goal is to continue with weight loss efforts. She has agreed to keep a food journal with 1200-1300 calories and 85 grams of protein. She may supplement with a Premier protein shake daily. Angel French has been instructed to continue her current exercise regimen for weight loss and overall health benefits. We discussed the following Behavioral Modification Strategies today: increasing lean protein intake, decreasing simple carbohydrates, emotional eating strategies, and planning for success.  Angel French has agreed to follow-up with our clinic in 2 weeks. She was informed of the importance of frequent follow-up visits to maximize her success with intensive lifestyle modifications for her multiple health conditions.  ALLERGIES: Allergies  Allergen Reactions  . Codeine Anaphylaxis  . Contrast Media [Iodinated Diagnostic Agents] Anaphylaxis  . Nitrofurantoin Monohyd Macro Anaphylaxis  . Betadine [Povidone Iodine] Itching  . Folic Acid Itching  . Gabapentin Other (See Comments)    Makes patient feel drunk  . Iodine Hives  . Lyrica [Pregabalin] Other (See Comments)    Makes patient feel drunk  . Red Dye Itching  . Ultram [Tramadol Hcl] Nausea And Vomiting    MEDICATIONS: Current Outpatient Medications on File Prior to Visit  Medication Sig Dispense Refill  . acetaminophen (TYLENOL) 325 MG tablet Take 650 mg by mouth every 6 (six) hours as needed (every 6 weeks before RA infusion).    Marland Kitchen allopurinol (ZYLOPRIM) 100 MG tablet Take 1 tablet (100 mg total) by mouth daily. Ov needed (Patient taking differently: Take 200 mg by mouth daily. ) 90 tablet 3  . aspirin 325 MG tablet Take 325 mg by mouth daily.     . Blood Glucose Monitoring Suppl (BLOOD GLUCOSE METER KIT AND SUPPLIES) KIT Dispense based on patient and insurance preference. Use up to four times daily as directed. (FOR ICD-9 250.00, 250.01). 1 each 11   . Cholecalciferol (VITAMIN D3) 5000 units CAPS Take 1 capsule by mouth daily.    . diclofenac sodium (VOLTAREN) 1 % GEL Apply 2 g topically 4 (four) times daily. (Patient taking differently: Apply 2 g topically 2 (two) times daily as needed (pain). ) 100 g 3  . diphenhydrAMINE (BENADRYL) 25 MG tablet Take 25 mg by mouth every 6 (six) hours as needed (every 6 weeks prior to RA infusion).    . DULoxetine (CYMBALTA) 30 MG capsule TAKE 1 CAPSULE BY MOUTH EVERY DAY 90 capsule 3  . escitalopram (LEXAPRO) 20 MG tablet TAKE 1 TABLET BY MOUTH EVERY DAY 90 tablet 1  . furosemide (LASIX) 20 MG tablet Take 1-3 tablets (20-60 mg total) by mouth daily. 200 tablet 1  . glucose blood test strip Check sugar three times daily  Dx: DMII insulin dependent with retinopathy, neuropathy controlled 300 each 3  . HYDROcodone-acetaminophen (NORCO) 10-325 MG tablet Take 1 tablet by mouth every 12 (twelve) hours as needed. 60 tablet 0  . insulin aspart (NOVOLOG) 100 UNIT/ML injection Inject 25 Units into the skin 3 (three)  times daily with meals. 10 mL 11  . Insulin Syringes, Disposable, U-100 0.5 ML MISC 28 Units by Does not apply route 2 (two) times daily. 100 each 11  . leflunomide (ARAVA) 20 MG tablet Take 20 mg by mouth daily.     Marland Kitchen LEVEMIR 100 UNIT/ML injection INJECT 60 UNITS AT BEDTIME AS DIRECTED (Patient taking differently: Inject 90 Units into the skin daily. ) 20 mL 1  . linaclotide (LINZESS) 290 MCG CAPS capsule Take 290 mcg by mouth daily before breakfast.    . Needles & Syringes MISC 1 Syringe by Does not apply route 2 (two) times daily. 100 each 11  . omeprazole (PRILOSEC) 20 MG capsule TAKE 1 CAPSULE BY MOUTH EVERY DAY 90 capsule 3  . oxybutynin (DITROPAN XL) 15 MG 24 hr tablet TAKE 1 TABLET BY MOUTH AT BEDTIME 90 tablet 3  . predniSONE (DELTASONE) 5 MG tablet Take 5 mg by mouth daily with breakfast. Only takes with RA Flare.    . rosuvastatin (CRESTOR) 10 MG tablet Take 1 tablet (10 mg total) by mouth daily.  90 tablet 1  . traZODone (DESYREL) 100 MG tablet TAKE 2 TABLETS BY MOUTH EVERY DAY AT BEDTIME 180 tablet 1  . ULTICARE INSULIN SYRINGE 31G X 5/16" 0.5 ML MISC USE AS DIRECTED TO INJECT INSULIN 2 TIMES DAILY 100 each 4   No current facility-administered medications on file prior to visit.     PAST MEDICAL HISTORY: Past Medical History:  Diagnosis Date  . Allergy    generic allergy pill; Spring and Fall only.  . Anxiety   . Arthritis    DDD lumbar, R hip OA.  s/p ortho consult in past.  . Blood transfusion without reported diagnosis    Mountain climbing accident in [place].  . Brachial plexus disorders   . Cataract    B retractions.  . Chronic kidney disease    stage 3 per pt.   . Chronic pain syndrome   . Chronic renal insufficiency, stage 3 (moderate) (HCC)   . Constipation   . DDD (degenerative disc disease), lumbar   . Depression   . Diabetes mellitus   . Diabetic peripheral neuropathy associated with type 2 diabetes mellitus ([place])   . Diabetic retinopathy ([place])   . Diabetic retinopathy associated with type 2 diabetes mellitus ([place])    s/p laser treatment multiple.  Unable to drive.  . Fatty liver   . Fibromyalgia   . Food allergy   . GERD (gastroesophageal reflux disease)   . Hypercholesteremia   . Hyperlipidemia   . Hypertension    controlled, off meds   . IBS (irritable bowel syndrome)   . Leg edema   . Neuromuscular disorder ([place])   . OSA (obstructive sleep apnea)   . Osteoarthritis   . Rheumatic fever   . Rheumatoid arthritis ([place])   . Stomach ulcer   . Swallowing difficulty   . TIA (transient ischemic attack)   . Ulcer    Peptic ulcer H. Pylori + s/p treatment.  Upper GI diagnosed.[name] Prilosec PRN .    PAST SURGICAL HISTORY: Past Surgical History:  Procedure Laterality Date  .  2 SPINAL INJECTIONS     . ABDOMINAL HYSTERECTOMY  11/02/1979   DUB; cervical dysplasia; ovaries intact.  . ABDOMINAL SURGERY     staph abcess   . [facility]  Admission     age 10; three months in [place].  Marland Kitchen BREAST BIOPSY    . CARDIAC  CATHETERIZATION  11/02/2007   normal coronary arteries.  . CARPAL TUNNEL RELEASE     Bilateral.  . CATARACT EXTRACTION, BILATERAL    . CHOLECYSTECTOMY    . ESOPHAGEAL MANOMETRY N/A French14/2018   Procedure: ESOPHAGEAL MANOMETRY (EM);  Surgeon: Ronnette Juniper, MD;  Location: [facility];  Service: Gastroenterology;  Laterality: N/A;  . EYE SURGERY     Cataracts B. Laser surgery x 7 for Diabetic Retinopathy  . TONSILLECTOMY      SOCIAL HISTORY: Social History   Tobacco Use  . Smoking status: Former Research scientist (life sciences)  . Smokeless tobacco: Never Used  . Tobacco comment: Quit 1987  Substance Use Topics  . Alcohol use: No    Alcohol/week: 0.0 standard drinks  . Drug use: No    FAMILY HISTORY: Family History  Adopted: Yes  Family history unknown: Yes   ROS: Review of Systems  Psychiatric/Behavioral: Positive for depression (emotional eating). Negative for suicidal ideas.       Negative for homicidal ideas.   PHYSICAL EXAM: Pt in no acute distress  RECENT LABS AND TESTS: BMET    Component Value Date/Time   NA 143 01/02/2019 1258   K 3.9 01/02/2019 1258   CL 100 01/02/2019 1258   CO2 24 01/02/2019 1258   GLUCOSE 38 (LL) 01/02/2019 1258   GLUCOSE 58 (L) French08/2017 1035   BUN 31 (H) 01/02/2019 1258   CREATININE 1.43 (H) 01/02/2019 1258   CREATININE 1.30 (H) French08/2017 1035   CALCIUM 9.9 01/02/2019 1258   GFRNONAA 38 (L) 01/02/2019 1258   GFRNONAA 36 (L) 07/10/2014 1354   GFRAA 44 (L) 01/02/2019 1258   GFRAA 41 (L) 07/10/2014 1354   Lab Results  Component Value Date   HGBA1C 5.1 01/02/2019   HGBA1C 6.0 (H) 08/03/2018   HGBA1C 8.1 (H) 03/07/2018   HGBA1C 11.0 03/French2019   HGBA1C 8.0 10/07/2017   Lab Results  Component Value Date   INSULIN 6.2 03/07/2018   CBC    Component Value Date/Time   WBC 5.1 01/30/2018 1000   WBC 5.7 French08/2017 1035   RBC 3.38 (L) 01/30/2018 1000   RBC 3.82  French08/2017 1035   HGB 10.9 (L) 01/30/2018 1000   HCT 30.8 (L) 01/30/2018 1000   PLT 226 01/30/2018 1000   MCV 91 01/30/2018 1000   MCH 32.2 01/30/2018 1000   MCH 29.8 French08/2017 1035   MCHC 35.4 01/30/2018 1000   MCHC 32.5 French08/2017 1035   RDW 14.8 01/30/2018 1000   LYMPHSABS 1.7 01/30/2018 1000   MONOABS 513 French08/2017 1035   EOSABS 0.3 01/30/2018 1000   BASOSABS 0.0 01/30/2018 1000   Iron/TIBC/Ferritin/ %Sat    Component Value Date/Time   IRON 79 02/03/2016 1015   TIBC 309 02/03/2016 1015   IRONPCTSAT 26 02/03/2016 1015   Lipid Panel     Component Value Date/Time   CHOL 162 01/02/2019 1258   TRIG 225 (H) 01/02/2019 1258   HDL 37 (L) 01/02/2019 1258   CHOLHDL 3.9 08/03/2018 1209   CHOLHDL 5.3 (H) French08/2017 1035   VLDL 46 (H) French08/2017 1035   LDLCALC 80 01/02/2019 1258   Hepatic Function Panel     Component Value Date/Time   PROT 7.6 01/02/2019 1258   ALBUMIN 4.3 01/02/2019 1258   AST 23 01/02/2019 1258   ALT 16 01/02/2019 1258   ALKPHOS 108 01/02/2019 1258   BILITOT 0.3 01/02/2019 1258      Component Value Date/Time   TSH 0.958 03/07/2018 1310   TSH 0.874 01/30/2018  1000   TSH 1.060 03/23/2017 1230   Results for CHRISHAWN, KRING (MRN [id number]) as of 04/17/2019 14:51  Ref. Range 01/02/2019 12:58  Vitamin D, 25-Hydroxy Latest Ref Range: 30.0 - 100.0 ng/mL 64.9    I, Michaelene Song, am acting as Location manager for [employer], FNP  I have reviewed the above documentation for accuracy and completeness, and I agree with the above.  - Marvelle Span, FNP-C.

## 2019-04-20 DIAGNOSIS — E1121 Type 2 diabetes mellitus with diabetic nephropathy: Secondary | ICD-10-CM | POA: Diagnosis not present

## 2019-04-20 DIAGNOSIS — N83202 Unspecified ovarian cyst, left side: Secondary | ICD-10-CM | POA: Diagnosis not present

## 2019-04-20 DIAGNOSIS — E113393 Type 2 diabetes mellitus with moderate nonproliferative diabetic retinopathy without macular edema, bilateral: Secondary | ICD-10-CM | POA: Diagnosis not present

## 2019-04-20 DIAGNOSIS — N390 Urinary tract infection, site not specified: Secondary | ICD-10-CM | POA: Diagnosis not present

## 2019-04-20 DIAGNOSIS — E7800 Pure hypercholesterolemia, unspecified: Secondary | ICD-10-CM | POA: Diagnosis not present

## 2019-04-20 DIAGNOSIS — R300 Dysuria: Secondary | ICD-10-CM | POA: Diagnosis not present

## 2019-04-20 DIAGNOSIS — F99 Mental disorder, not otherwise specified: Secondary | ICD-10-CM | POA: Diagnosis not present

## 2019-04-20 DIAGNOSIS — M05741 Rheumatoid arthritis with rheumatoid factor of right hand without organ or systems involvement: Secondary | ICD-10-CM | POA: Diagnosis not present

## 2019-04-20 DIAGNOSIS — K219 Gastro-esophageal reflux disease without esophagitis: Secondary | ICD-10-CM | POA: Diagnosis not present

## 2019-04-20 DIAGNOSIS — G894 Chronic pain syndrome: Secondary | ICD-10-CM | POA: Diagnosis not present

## 2019-04-20 DIAGNOSIS — M5136 Other intervertebral disc degeneration, lumbar region: Secondary | ICD-10-CM | POA: Diagnosis not present

## 2019-04-20 DIAGNOSIS — Z794 Long term (current) use of insulin: Secondary | ICD-10-CM | POA: Diagnosis not present

## 2019-04-20 DIAGNOSIS — M05742 Rheumatoid arthritis with rheumatoid factor of left hand without organ or systems involvement: Secondary | ICD-10-CM | POA: Diagnosis not present

## 2019-04-20 DIAGNOSIS — F5105 Insomnia due to other mental disorder: Secondary | ICD-10-CM | POA: Diagnosis not present

## 2019-05-01 ENCOUNTER — Other Ambulatory Visit: Payer: Self-pay

## 2019-05-01 ENCOUNTER — Encounter (INDEPENDENT_AMBULATORY_CARE_PROVIDER_SITE_OTHER): Payer: Self-pay | Admitting: Physician Assistant

## 2019-05-01 ENCOUNTER — Telehealth (INDEPENDENT_AMBULATORY_CARE_PROVIDER_SITE_OTHER): Payer: Medicare Other | Admitting: Physician Assistant

## 2019-05-01 DIAGNOSIS — E1142 Type 2 diabetes mellitus with diabetic polyneuropathy: Secondary | ICD-10-CM

## 2019-05-01 DIAGNOSIS — Z794 Long term (current) use of insulin: Secondary | ICD-10-CM | POA: Diagnosis not present

## 2019-05-01 DIAGNOSIS — Z6837 Body mass index (BMI) 37.0-37.9, adult: Secondary | ICD-10-CM | POA: Diagnosis not present

## 2019-05-02 NOTE — Progress Notes (Signed)
Office: [phone]  /  Fax: [phone] TeleHealth Visit:  Angel French has verbally consented to this TeleHealth visit today. The patient is located at home, the provider is located at the [facility] office. The participants in this visit include the listed provider and patient. The visit was conducted today via FaceTime.  HPI:   Chief Complaint: OBESITY Angel French is here to discuss her progress with her obesity treatment plan. She is keeping a food journal with 1300 calories and 85 grams of protein and is following her eating plan approximately 75% of the time. She states she is walking the dogs daily.  Angel French reports that she has had another death in her family, and that she also has been bored so she has been stress eating. She is craving a lot of bread. She is ordering through [employer] and many times they bring her the wrong food. We were unable to weigh the patient today for this TeleHealth visit. She feels as if she has gained weight since her last visit. She has lost 17 lbs since starting treatment with [name].  Diabetes II with Long-Term Insulin Angel French has a diagnosis of diabetes type II and is on NovoLog 30 units with every meal. Angel French does not report checking her blood sugars. She reports her A1c was 5.7 last week. She has been working on intensive lifestyle modifications including diet, exercise, and weight loss to help control her blood glucose levels.  ASSESSMENT AND PLAN:  Type 2 diabetes mellitus with diabetic polyneuropathy, with long-term current use of insulin (HCC)  Class 2 severe obesity with serious comorbidity and body mass index (BMI) of 37.0 to 37.9 in adult, unspecified obesity type ([place])  PLAN:  Diabetes II with Long-Term Insulin Angel French has been given extensive diabetes education by myself today including ideal fasting and post-prandial blood glucose readings, individual ideal HgA1c goals  and hypoglycemia prevention. We discussed the importance  of good blood sugar control to decrease the likelihood of diabetic complications such as nephropathy, neuropathy, limb loss, blindness, coronary artery disease, and death. We discussed the importance of intensive lifestyle modification including diet, exercise and weight loss as the first line treatment for diabetes. Angel French agrees to continue her diabetes medications and will follow-up at the agreed upon time.  Obesity Angel French is currently in the action stage of change. As such, her goal is to continue with weight loss efforts. She has agreed to follow the Category 2 plan and journal 1200-1300 calories + 85 grams of protein. Angel French has been instructed to work up to a goal of 150 minutes of combined cardio and strengthening exercise per week for weight loss and overall health benefits. We discussed the following Behavioral Modification Strategies today: work on meal planning, easy cooking plans, and ways to avoid boredom eating.  Angel French has agreed to follow-up with our clinic in 2 weeks. She was informed of the importance of frequent follow-up visits to maximize her success with intensive lifestyle modifications for her multiple health conditions.  ALLERGIES: Allergies  Allergen Reactions   Codeine Anaphylaxis   Contrast Media [Iodinated Diagnostic Agents] Anaphylaxis   Nitrofurantoin Monohyd Macro Anaphylaxis   Betadine [Povidone Iodine] Itching   Folic Acid Itching   Gabapentin Other (See Comments)    Makes patient feel drunk   Iodine Hives   Lyrica [Pregabalin] Other (See Comments)    Makes patient feel drunk   Red Dye Itching   Ultram [Tramadol Hcl] Nausea And Vomiting    MEDICATIONS: Current Outpatient Medications  on File Prior to Visit  Medication Sig Dispense Refill   acetaminophen (TYLENOL) 325 MG tablet Take 650 mg by mouth every 6 (six) hours as needed (every 6 weeks before RA infusion).     allopurinol (ZYLOPRIM) 100 MG tablet Take 1 tablet (100 mg total) by mouth  daily. Ov needed (Patient taking differently: Take 200 mg by mouth daily. ) 90 tablet 3   aspirin 325 MG tablet Take 325 mg by mouth daily.      Blood Glucose Monitoring Suppl (BLOOD GLUCOSE METER KIT AND SUPPLIES) KIT Dispense based on patient and insurance preference. Use up to four times daily as directed. (FOR ICD-9 250.00, 250.01). 1 each 11   Cholecalciferol (VITAMIN D3) 5000 units CAPS Take 1 capsule by mouth daily.     diclofenac sodium (VOLTAREN) 1 % GEL Apply 2 g topically 4 (four) times daily. (Patient taking differently: Apply 2 g topically 2 (two) times daily as needed (pain). ) 100 g 3   diphenhydrAMINE (BENADRYL) 25 MG tablet Take 25 mg by mouth every 6 (six) hours as needed (every 6 weeks prior to RA infusion).     DULoxetine (CYMBALTA) 30 MG capsule TAKE 1 CAPSULE BY MOUTH EVERY DAY 90 capsule 3   escitalopram (LEXAPRO) 20 MG tablet TAKE 1 TABLET BY MOUTH EVERY DAY 90 tablet 1   furosemide (LASIX) 20 MG tablet Take 1-3 tablets (20-60 mg total) by mouth daily. 200 tablet 1   glucose blood test strip Check sugar three times daily  Dx: DMII insulin dependent with retinopathy, neuropathy controlled 300 each 3   HYDROcodone-acetaminophen (NORCO) 10-325 MG tablet Take 1 tablet by mouth every 12 (twelve) hours as needed. 60 tablet 0   insulin aspart (NOVOLOG) 100 UNIT/ML injection Inject 25 Units into the skin 3 (three) times daily with meals. 10 mL 11   Insulin Syringes, Disposable, U-100 0.5 ML MISC 28 Units by Does not apply route 2 (two) times daily. 100 each 11   leflunomide (ARAVA) 20 MG tablet Take 20 mg by mouth daily.      LEVEMIR 100 UNIT/ML injection INJECT 60 UNITS AT BEDTIME AS DIRECTED (Patient taking differently: Inject 90 Units into the skin daily. ) 20 mL 1   linaclotide (LINZESS) 290 MCG CAPS capsule Take 290 mcg by mouth daily before breakfast.     Needles & Syringes MISC 1 Syringe by Does not apply route 2 (two) times daily. 100 each 11   omeprazole  (PRILOSEC) 20 MG capsule TAKE 1 CAPSULE BY MOUTH EVERY DAY 90 capsule 3   oxybutynin (DITROPAN XL) 15 MG 24 hr tablet TAKE 1 TABLET BY MOUTH AT BEDTIME 90 tablet 3   predniSONE (DELTASONE) 5 MG tablet Take 5 mg by mouth daily with breakfast. Only takes with RA Flare.     rosuvastatin (CRESTOR) 10 MG tablet Take 1 tablet (10 mg total) by mouth daily. 90 tablet 1   traZODone (DESYREL) 100 MG tablet TAKE 2 TABLETS BY MOUTH EVERY DAY AT BEDTIME 180 tablet 1   ULTICARE INSULIN SYRINGE 31G X 5/16" 0.5 ML MISC USE AS DIRECTED TO INJECT INSULIN 2 TIMES DAILY 100 each 4   No current facility-administered medications on file prior to visit.     PAST MEDICAL HISTORY: Past Medical History:  Diagnosis Date   Allergy    generic allergy pill; Spring and Fall only.   Anxiety    Arthritis    DDD lumbar, R hip OA.  s/p ortho consult in past.  Blood transfusion without reported diagnosis    Mountain climbing accident in [place].   Brachial plexus disorders    Cataract    B retractions.   Chronic kidney disease    stage 3 per pt.    Chronic pain syndrome    Chronic renal insufficiency, stage 3 (moderate) (HCC)    Constipation    DDD (degenerative disc disease), lumbar    Depression    Diabetes mellitus    Diabetic peripheral neuropathy associated with type 2 diabetes mellitus (HCC)    Diabetic retinopathy ([place])    Diabetic retinopathy associated with type 2 diabetes mellitus ([place])    s/p laser treatment multiple.  Unable to drive.   Fatty liver    Fibromyalgia    Food allergy    GERD (gastroesophageal reflux disease)    Hypercholesteremia    Hyperlipidemia    Hypertension    controlled, off meds    IBS (irritable bowel syndrome)    Leg edema    Neuromuscular disorder (HCC)    OSA (obstructive sleep apnea)    Osteoarthritis    Rheumatic fever    Rheumatoid arthritis (HCC)    Stomach ulcer    Swallowing difficulty    TIA (transient ischemic attack)     Ulcer    Peptic ulcer H. Pylori + s/p treatment.  Upper GI diagnosed.[name] Prilosec PRN .    PAST SURGICAL HISTORY: Past Surgical History:  Procedure Laterality Date    2 SPINAL INJECTIONS      ABDOMINAL HYSTERECTOMY  11/02/1979   DUB; cervical dysplasia; ovaries intact.   ABDOMINAL SURGERY     staph abcess    [facility] Admission     age 26; three months in [place].   BREAST BIOPSY     CARDIAC CATHETERIZATION  11/02/2007   normal coronary arteries.   CARPAL TUNNEL RELEASE     Bilateral.   CATARACT EXTRACTION, BILATERAL     CHOLECYSTECTOMY     ESOPHAGEAL MANOMETRY N/A 09/14/2017   Procedure: ESOPHAGEAL MANOMETRY (EM);  Surgeon: Ronnette Juniper, MD;  Location: [facility];  Service: Gastroenterology;  Laterality: N/A;   EYE SURGERY     Cataracts B. Laser surgery x 7 for Diabetic Retinopathy   TONSILLECTOMY      SOCIAL HISTORY: Social History   Tobacco Use   Smoking status: Former Smoker   Smokeless tobacco: Never Used   Tobacco comment: Quit 1987  Substance Use Topics   Alcohol use: No    Alcohol/week: 0.0 standard drinks   Drug use: No    FAMILY HISTORY: Family History  Adopted: Yes  Family history unknown: Yes    ROS: ROS none reported.  PHYSICAL EXAM: Pt in no acute distress  RECENT LABS AND TESTS: BMET    Component Value Date/Time   NA 143 01/02/2019 1258   K 3.9 01/02/2019 1258   CL 100 01/02/2019 1258   CO2 24 01/02/2019 1258   GLUCOSE 38 (LL) 01/02/2019 1258   GLUCOSE 58 (L) 09/08/2016 1035   BUN 31 (H) 01/02/2019 1258   CREATININE 1.43 (H) 01/02/2019 1258   CREATININE 1.30 (H) 09/08/2016 1035   CALCIUM 9.9 01/02/2019 1258   GFRNONAA 38 (L) 01/02/2019 1258   GFRNONAA 36 (L) 07/10/2014 1354   GFRAA 44 (L) 01/02/2019 1258   GFRAA 41 (L) 07/10/2014 1354   Lab Results  Component Value Date   HGBA1C 5.1 01/02/2019   HGBA1C 6.0 (H) 08/03/2018   HGBA1C 8.1 (H) 03/07/2018   HGBA1C  11.0 01/09/2018   HGBA1C 8.0  10/07/2017   Lab Results  Component Value Date   INSULIN 6.2 03/07/2018   CBC    Component Value Date/Time   WBC 5.1 01/30/2018 1000   WBC 5.7 09/08/2016 1035   RBC 3.38 (L) 01/30/2018 1000   RBC 3.82 09/08/2016 1035   HGB 10.9 (L) 01/30/2018 1000   HCT 30.8 (L) 01/30/2018 1000   PLT 226 01/30/2018 1000   MCV 91 01/30/2018 1000   MCH 32.2 01/30/2018 1000   MCH 29.8 09/08/2016 1035   MCHC 35.4 01/30/2018 1000   MCHC 32.5 09/08/2016 1035   RDW 14.8 01/30/2018 1000   LYMPHSABS 1.7 01/30/2018 1000   MONOABS 513 09/08/2016 1035   EOSABS 0.3 01/30/2018 1000   BASOSABS 0.0 01/30/2018 1000   Iron/TIBC/Ferritin/ %Sat    Component Value Date/Time   IRON 79 02/03/2016 1015   TIBC 309 02/03/2016 1015   IRONPCTSAT 26 02/03/2016 1015   Lipid Panel     Component Value Date/Time   CHOL 162 01/02/2019 1258   TRIG 225 (H) 01/02/2019 1258   HDL 37 (L) 01/02/2019 1258   CHOLHDL 3.9 08/03/2018 1209   CHOLHDL 5.3 (H) 09/08/2016 1035   VLDL 46 (H) 09/08/2016 1035   LDLCALC 80 01/02/2019 1258   Hepatic Function Panel     Component Value Date/Time   PROT 7.6 01/02/2019 1258   ALBUMIN 4.3 01/02/2019 1258   AST 23 01/02/2019 1258   ALT 16 01/02/2019 1258   ALKPHOS 108 01/02/2019 1258   BILITOT 0.3 01/02/2019 1258      Component Value Date/Time   TSH 0.958 03/07/2018 1310   TSH 0.874 01/30/2018 1000   TSH 1.060 03/23/2017 1230   Results for MACKENSIE, PILSON (MRN [id number]) as of 05/02/2019 10:50  Ref. Range 01/02/2019 12:58  Vitamin D, 25-Hydroxy Latest Ref Range: 30.0 - 100.0 ng/mL 64.9   I, Michaelene Song, am acting as Location manager for [employer], PA-C I, Abby Potash, PA-C have reviewed above note and agree with its content

## 2019-05-08 DIAGNOSIS — M0609 Rheumatoid arthritis without rheumatoid factor, multiple sites: Secondary | ICD-10-CM | POA: Diagnosis not present

## 2019-05-08 DIAGNOSIS — Z79899 Other long term (current) drug therapy: Secondary | ICD-10-CM | POA: Diagnosis not present

## 2019-05-16 ENCOUNTER — Other Ambulatory Visit: Payer: Self-pay

## 2019-05-16 ENCOUNTER — Ambulatory Visit (INDEPENDENT_AMBULATORY_CARE_PROVIDER_SITE_OTHER): Payer: Medicare Other | Admitting: Physician Assistant

## 2019-05-16 ENCOUNTER — Encounter (INDEPENDENT_AMBULATORY_CARE_PROVIDER_SITE_OTHER): Payer: Self-pay | Admitting: Physician Assistant

## 2019-05-16 DIAGNOSIS — Z6837 Body mass index (BMI) 37.0-37.9, adult: Secondary | ICD-10-CM

## 2019-05-16 DIAGNOSIS — Z794 Long term (current) use of insulin: Secondary | ICD-10-CM

## 2019-05-16 DIAGNOSIS — E119 Type 2 diabetes mellitus without complications: Secondary | ICD-10-CM

## 2019-05-16 NOTE — Progress Notes (Signed)
Office: [phone]  /  Fax: [phone] TeleHealth Visit:  Angel French has verbally consented to this TeleHealth visit today. The patient is located at home, the provider is located at the [facility] office. The participants in this visit include the listed provider and patient. The visit was conducted today via facetime.  HPI:   Chief Complaint: OBESITY Angel French is here to discuss her progress with her obesity treatment plan. She is on the  keep a food journal with 1200-1300 calories and 85+g pro protein daily or follow the Category 2 plan and is following her eating plan approximately 85-90 % of the time. She states she is exercising 0 minutes 0 times per week. Angel French most recent weight is 207 lbs. She continues to crave bread due to stress she is under at home. She is not reaching her protein goal.  We were unable to weigh the patient today for this TeleHealth visit. She feels as if she has lost weight since her last visit. She has lost 17 lbs since starting treatment with [name].  Diabetes II Angel French has a diagnosis of diabetes type II. Angel French states BGs range between 103 and 110 and denies any hypoglycemic episodes. Last A1c was 6.0. She has been working on intensive lifestyle modifications including diet, exercise, and weight loss to help control her blood glucose levels. She is currently on insulin.   ASSESSMENT AND PLAN:  Type 2 diabetes mellitus without complication, with long-term current use of insulin (HCC)  Class 2 severe obesity with serious comorbidity and body mass index (BMI) of 37.0 to 37.9 in adult, unspecified obesity type ([place])  PLAN: Diabetes II Angel French has been given extensive diabetes education by myself today including ideal fasting and post-prandial blood glucose readings, individual ideal HgA1c goals  and hypoglycemia prevention. We discussed the importance of good blood sugar control to decrease the likelihood of diabetic complications such as  nephropathy, neuropathy, limb loss, blindness, coronary artery disease, and death. We discussed the importance of intensive lifestyle modification including diet, exercise and weight loss as the first line treatment for diabetes. Angel French agrees to continue her diabetes medications and will follow up at the agreed upon time.  Obesity [name] is currently in the action stage of change. As such, her goal is to continue with weight loss efforts She has agreed to keep a food journal with 1200-1300 calories and 85g of protein daily.  [name] has been instructed to work up to a goal of 150 minutes of combined cardio and strengthening exercise per week for weight loss and overall health benefits. We discussed the following Behavioral Modification Stratagies today: increasing lean protein intake and work on meal planning and easy cooking plans   Angel French has agreed to follow up with our clinic in 3 weeks. She was informed of the importance of frequent follow up visits to maximize her success with intensive lifestyle modifications for her multiple health conditions.  ALLERGIES: Allergies  Allergen Reactions   Codeine Anaphylaxis   Contrast Media [Iodinated Diagnostic Agents] Anaphylaxis   Nitrofurantoin Monohyd Macro Anaphylaxis   Betadine [Povidone Iodine] Itching   Folic Acid Itching   Gabapentin Other (See Comments)    Makes patient feel drunk   Iodine Hives   Lyrica [Pregabalin] Other (See Comments)    Makes patient feel drunk   Red Dye Itching   Ultram [Tramadol Hcl] Nausea And Vomiting    MEDICATIONS: Current Outpatient Medications on File Prior to Visit  Medication Sig Dispense Refill  acetaminophen (TYLENOL) 325 MG tablet Take 650 mg by mouth every 6 (six) hours as needed (every 6 weeks before RA infusion).     allopurinol (ZYLOPRIM) 100 MG tablet Take 1 tablet (100 mg total) by mouth daily. Ov needed (Patient taking differently: Take 200 mg by mouth daily. ) 90 tablet 3    aspirin 325 MG tablet Take 325 mg by mouth daily.      Blood Glucose Monitoring Suppl (BLOOD GLUCOSE METER KIT AND SUPPLIES) KIT Dispense based on patient and insurance preference. Use up to four times daily as directed. (FOR ICD-9 250.00, 250.01). 1 each 11   Cholecalciferol (VITAMIN D3) 5000 units CAPS Take 1 capsule by mouth daily.     diclofenac sodium (VOLTAREN) 1 % GEL Apply 2 g topically 4 (four) times daily. (Patient taking differently: Apply 2 g topically 2 (two) times daily as needed (pain). ) 100 g 3   diphenhydrAMINE (BENADRYL) 25 MG tablet Take 25 mg by mouth every 6 (six) hours as needed (every 6 weeks prior to RA infusion).     DULoxetine (CYMBALTA) 30 MG capsule TAKE 1 CAPSULE BY MOUTH EVERY DAY 90 capsule 3   escitalopram (LEXAPRO) 20 MG tablet TAKE 1 TABLET BY MOUTH EVERY DAY 90 tablet 1   furosemide (LASIX) 20 MG tablet Take 1-3 tablets (20-60 mg total) by mouth daily. 200 tablet 1   glucose blood test strip Check sugar three times daily  Dx: DMII insulin dependent with retinopathy, neuropathy controlled 300 each 3   HYDROcodone-acetaminophen (NORCO) 10-325 MG tablet Take 1 tablet by mouth every 12 (twelve) hours as needed. 60 tablet 0   insulin aspart (NOVOLOG) 100 UNIT/ML injection Inject 25 Units into the skin 3 (three) times daily with meals. 10 mL 11   Insulin Syringes, Disposable, U-100 0.5 ML MISC 28 Units by Does not apply route 2 (two) times daily. 100 each 11   leflunomide (ARAVA) 20 MG tablet Take 20 mg by mouth daily.      LEVEMIR 100 UNIT/ML injection INJECT 60 UNITS AT BEDTIME AS DIRECTED (Patient taking differently: Inject 90 Units into the skin daily. ) 20 mL 1   linaclotide (LINZESS) 290 MCG CAPS capsule Take 290 mcg by mouth daily before breakfast.     Needles & Syringes MISC 1 Syringe by Does not apply route 2 (two) times daily. 100 each 11   omeprazole (PRILOSEC) 20 MG capsule TAKE 1 CAPSULE BY MOUTH EVERY DAY 90 capsule 3   oxybutynin  (DITROPAN XL) 15 MG 24 hr tablet TAKE 1 TABLET BY MOUTH AT BEDTIME 90 tablet 3   predniSONE (DELTASONE) 5 MG tablet Take 5 mg by mouth daily with breakfast. Only takes with RA Flare.     rosuvastatin (CRESTOR) 10 MG tablet Take 1 tablet (10 mg total) by mouth daily. 90 tablet 1   traZODone (DESYREL) 100 MG tablet TAKE 2 TABLETS BY MOUTH EVERY DAY AT BEDTIME 180 tablet 1   ULTICARE INSULIN SYRINGE 31G X 5/16" 0.5 ML MISC USE AS DIRECTED TO INJECT INSULIN 2 TIMES DAILY 100 each 4   No current facility-administered medications on file prior to visit.     PAST MEDICAL HISTORY: Past Medical History:  Diagnosis Date   Allergy    generic allergy pill; Spring and Fall only.   Anxiety    Arthritis    DDD lumbar, R hip OA.  s/p ortho consult in past.   Blood transfusion without reported diagnosis    Mountain climbing accident in  [place].   Brachial plexus disorders    Cataract    B retractions.   Chronic kidney disease    stage 3 per pt.    Chronic pain syndrome    Chronic renal insufficiency, stage 3 (moderate) (HCC)    Constipation    DDD (degenerative disc disease), lumbar    Depression    Diabetes mellitus    Diabetic peripheral neuropathy associated with type 2 diabetes mellitus (HCC)    Diabetic retinopathy ([place])    Diabetic retinopathy associated with type 2 diabetes mellitus ([place])    s/p laser treatment multiple.  Unable to drive.   Fatty liver    Fibromyalgia    Food allergy    GERD (gastroesophageal reflux disease)    Hypercholesteremia    Hyperlipidemia    Hypertension    controlled, off meds    IBS (irritable bowel syndrome)    Leg edema    Neuromuscular disorder (HCC)    OSA (obstructive sleep apnea)    Osteoarthritis    Rheumatic fever    Rheumatoid arthritis (HCC)    Stomach ulcer    Swallowing difficulty    TIA (transient ischemic attack)    Ulcer    Peptic ulcer H. Pylori + s/p treatment.  Upper GI diagnosed.[name]  Prilosec PRN .    PAST SURGICAL HISTORY: Past Surgical History:  Procedure Laterality Date    2 SPINAL INJECTIONS      ABDOMINAL HYSTERECTOMY  11/02/1979   DUB; cervical dysplasia; ovaries intact.   ABDOMINAL SURGERY     staph abcess    [facility] Admission     age 52; three months in [place].   BREAST BIOPSY     CARDIAC CATHETERIZATION  11/02/2007   normal coronary arteries.   CARPAL TUNNEL RELEASE     Bilateral.   CATARACT EXTRACTION, BILATERAL     CHOLECYSTECTOMY     ESOPHAGEAL MANOMETRY N/A 09/14/2017   Procedure: ESOPHAGEAL MANOMETRY (EM);  Surgeon: Ronnette Juniper, MD;  Location: [facility];  Service: Gastroenterology;  Laterality: N/A;   EYE SURGERY     Cataracts B. Laser surgery x 7 for Diabetic Retinopathy   TONSILLECTOMY      SOCIAL HISTORY: Social History   Tobacco Use   Smoking status: Former Smoker   Smokeless tobacco: Never Used   Tobacco comment: Quit 1987  Substance Use Topics   Alcohol use: No    Alcohol/week: 0.0 standard drinks   Drug use: No    FAMILY HISTORY: Family History  Adopted: Yes  Family history unknown: Yes    ROS: Review of Systems  Endo/Heme/Allergies:       Negative for hypoglycemia    PHYSICAL EXAM: Pt in no acute distress  RECENT LABS AND TESTS: BMET    Component Value Date/Time   NA 143 01/02/2019 1258   K 3.9 01/02/2019 1258   CL 100 01/02/2019 1258   CO2 24 01/02/2019 1258   GLUCOSE 38 (LL) 01/02/2019 1258   GLUCOSE 58 (L) 09/08/2016 1035   BUN 31 (H) 01/02/2019 1258   CREATININE 1.43 (H) 01/02/2019 1258   CREATININE 1.30 (H) 09/08/2016 1035   CALCIUM 9.9 01/02/2019 1258   GFRNONAA 38 (L) 01/02/2019 1258   GFRNONAA 36 (L) 07/10/2014 1354   GFRAA 44 (L) 01/02/2019 1258   GFRAA 41 (L) 07/10/2014 1354   Lab Results  Component Value Date   HGBA1C 5.1 01/02/2019   HGBA1C 6.0 (H) 08/03/2018   HGBA1C 8.1 (H) 03/07/2018  HGBA1C 11.0 01/09/2018   HGBA1C 8.0 10/07/2017   Lab Results    Component Value Date   INSULIN 6.2 03/07/2018   CBC    Component Value Date/Time   WBC 5.1 01/30/2018 1000   WBC 5.7 09/08/2016 1035   RBC 3.38 (L) 01/30/2018 1000   RBC 3.82 09/08/2016 1035   HGB 10.9 (L) 01/30/2018 1000   HCT 30.8 (L) 01/30/2018 1000   PLT 226 01/30/2018 1000   MCV 91 01/30/2018 1000   MCH 32.2 01/30/2018 1000   MCH 29.8 09/08/2016 1035   MCHC 35.4 01/30/2018 1000   MCHC 32.5 09/08/2016 1035   RDW 14.8 01/30/2018 1000   LYMPHSABS 1.7 01/30/2018 1000   MONOABS 513 09/08/2016 1035   EOSABS 0.3 01/30/2018 1000   BASOSABS 0.0 01/30/2018 1000   Iron/TIBC/Ferritin/ %Sat    Component Value Date/Time   IRON 79 02/03/2016 1015   TIBC 309 02/03/2016 1015   IRONPCTSAT 26 02/03/2016 1015   Lipid Panel     Component Value Date/Time   CHOL 162 01/02/2019 1258   TRIG 225 (H) 01/02/2019 1258   HDL 37 (L) 01/02/2019 1258   CHOLHDL 3.9 08/03/2018 1209   CHOLHDL 5.3 (H) 09/08/2016 1035   VLDL 46 (H) 09/08/2016 1035   LDLCALC 80 01/02/2019 1258   Hepatic Function Panel     Component Value Date/Time   PROT 7.6 01/02/2019 1258   ALBUMIN 4.3 01/02/2019 1258   AST 23 01/02/2019 1258   ALT 16 01/02/2019 1258   ALKPHOS 108 01/02/2019 1258   BILITOT 0.3 01/02/2019 1258      Component Value Date/Time   TSH 0.958 03/07/2018 1310   TSH 0.874 01/30/2018 1000   TSH 1.060 03/23/2017 1230      I, Renee Ramus, am acting as Location manager for Abby Potash, PA-C I, Abby Potash, PA-C have reviewed above note and agree with its content

## 2019-05-30 DIAGNOSIS — M2550 Pain in unspecified joint: Secondary | ICD-10-CM | POA: Diagnosis not present

## 2019-05-30 DIAGNOSIS — E669 Obesity, unspecified: Secondary | ICD-10-CM | POA: Diagnosis not present

## 2019-05-30 DIAGNOSIS — M0609 Rheumatoid arthritis without rheumatoid factor, multiple sites: Secondary | ICD-10-CM | POA: Diagnosis not present

## 2019-05-30 DIAGNOSIS — R682 Dry mouth, unspecified: Secondary | ICD-10-CM | POA: Diagnosis not present

## 2019-05-30 DIAGNOSIS — M791 Myalgia, unspecified site: Secondary | ICD-10-CM | POA: Diagnosis not present

## 2019-05-30 DIAGNOSIS — M25551 Pain in right hip: Secondary | ICD-10-CM | POA: Diagnosis not present

## 2019-05-30 DIAGNOSIS — M109 Gout, unspecified: Secondary | ICD-10-CM | POA: Diagnosis not present

## 2019-05-30 DIAGNOSIS — M25531 Pain in right wrist: Secondary | ICD-10-CM | POA: Diagnosis not present

## 2019-05-30 DIAGNOSIS — Z79899 Other long term (current) drug therapy: Secondary | ICD-10-CM | POA: Diagnosis not present

## 2019-05-30 DIAGNOSIS — M5136 Other intervertebral disc degeneration, lumbar region: Secondary | ICD-10-CM | POA: Diagnosis not present

## 2019-05-30 DIAGNOSIS — Z6836 Body mass index (BMI) 36.0-36.9, adult: Secondary | ICD-10-CM | POA: Diagnosis not present

## 2019-06-01 DIAGNOSIS — M791 Myalgia, unspecified site: Secondary | ICD-10-CM | POA: Diagnosis not present

## 2019-06-01 DIAGNOSIS — M5136 Other intervertebral disc degeneration, lumbar region: Secondary | ICD-10-CM | POA: Diagnosis not present

## 2019-06-01 DIAGNOSIS — R682 Dry mouth, unspecified: Secondary | ICD-10-CM | POA: Diagnosis not present

## 2019-06-01 DIAGNOSIS — M2550 Pain in unspecified joint: Secondary | ICD-10-CM | POA: Diagnosis not present

## 2019-06-01 DIAGNOSIS — M0609 Rheumatoid arthritis without rheumatoid factor, multiple sites: Secondary | ICD-10-CM | POA: Diagnosis not present

## 2019-06-01 DIAGNOSIS — E669 Obesity, unspecified: Secondary | ICD-10-CM | POA: Diagnosis not present

## 2019-06-01 DIAGNOSIS — Z79899 Other long term (current) drug therapy: Secondary | ICD-10-CM | POA: Diagnosis not present

## 2019-06-01 DIAGNOSIS — Z6836 Body mass index (BMI) 36.0-36.9, adult: Secondary | ICD-10-CM | POA: Diagnosis not present

## 2019-06-01 DIAGNOSIS — M109 Gout, unspecified: Secondary | ICD-10-CM | POA: Diagnosis not present

## 2019-06-01 DIAGNOSIS — M25551 Pain in right hip: Secondary | ICD-10-CM | POA: Diagnosis not present

## 2019-06-01 DIAGNOSIS — M25531 Pain in right wrist: Secondary | ICD-10-CM | POA: Diagnosis not present

## 2019-06-04 ENCOUNTER — Telehealth (INDEPENDENT_AMBULATORY_CARE_PROVIDER_SITE_OTHER): Payer: Medicare Other | Admitting: Physician Assistant

## 2019-06-04 ENCOUNTER — Encounter (INDEPENDENT_AMBULATORY_CARE_PROVIDER_SITE_OTHER): Payer: Self-pay | Admitting: Physician Assistant

## 2019-06-04 ENCOUNTER — Other Ambulatory Visit: Payer: Self-pay

## 2019-06-04 DIAGNOSIS — E1140 Type 2 diabetes mellitus with diabetic neuropathy, unspecified: Secondary | ICD-10-CM | POA: Diagnosis not present

## 2019-06-04 DIAGNOSIS — Z6841 Body Mass Index (BMI) 40.0 and over, adult: Secondary | ICD-10-CM | POA: Diagnosis not present

## 2019-06-04 DIAGNOSIS — Z794 Long term (current) use of insulin: Secondary | ICD-10-CM

## 2019-06-04 NOTE — Progress Notes (Signed)
Office: [phone]  /  Fax: [phone] TeleHealth Visit:  Angel French has verbally consented to this TeleHealth visit today. The patient is located at home, the provider is located at the [employer] [facility] office. The participants in this visit include the listed provider and patient. The visit was conducted today via FaceTime.  HPI:   Chief Complaint: OBESITY Angel French is here to discuss her progress with her obesity treatment plan. She is keeping a food journal with 1200-1300 calories and 85 grams of protein and is following her eating plan approximately 95% of the time. She states she is exercising 0 minutes 0 times per week. Angel French states her last weight was 205 lbs. She is doing a better job getting in her protein but still only reaching 60 grams of protein daily. We were unable to weigh the patient today for this TeleHealth visit. She feels as if she has lost 2 lbs since her last visit. She has lost 17 lbs since starting treatment with [name].  Diabetes II Angel French has a diagnosis of diabetes type II. Angel French does not report checking her blood sugars. Last A1c was 5.1 on 01/02/2019. She has been working on intensive lifestyle modifications including diet, exercise, and weight loss to help control her blood glucose levels. No hypoglycemia or polyphagia.  ASSESSMENT AND PLAN:  Type 2 diabetes mellitus with diabetic neuropathy, with long-term current use of insulin (HCC)  Class 3 severe obesity with serious comorbidity and body mass index (BMI) of 40.0 to 44.9 in adult, unspecified obesity type ([place])  PLAN:  Diabetes II Angel French has been given extensive diabetes education by myself today including ideal fasting and post-prandial blood glucose readings, individual ideal HgA1c goals  and hypoglycemia prevention. We discussed the importance of good blood sugar control to decrease the likelihood of diabetic complications such as nephropathy, neuropathy, limb loss, blindness, coronary  artery disease, and death. We discussed the importance of intensive lifestyle modification including diet, exercise and weight loss as the first line treatment for diabetes. Angel French agrees to continue her diabetes medications and will follow-up at the agreed upon time.  Obesity Angel French is currently in the action stage of change. As such, her goal is to continue with weight loss efforts. She has agreed to keep a food journal with 1200-1300 calories and 85 grams of protein daily. [name] has been instructed to work up to a goal of 150 minutes of combined cardio and strengthening exercise per week for weight loss and overall health benefits. We discussed the following Behavioral Modification Strategies today: increasing lean protein intake, work on meal planning and easy cooking plans.  Angel French has agreed to follow-up with our clinic in 2 weeks. She was informed of the importance of frequent follow-up visits to maximize her success with intensive lifestyle modifications for her multiple health conditions.  ALLERGIES: Allergies  Allergen Reactions   Codeine Anaphylaxis   Contrast Media [Iodinated Diagnostic Agents] Anaphylaxis   Nitrofurantoin Monohyd Macro Anaphylaxis   Betadine [Povidone Iodine] Itching   Folic Acid Itching   Gabapentin Other (See Comments)    Makes patient feel drunk   Iodine Hives   Lyrica [Pregabalin] Other (See Comments)    Makes patient feel drunk   Red Dye Itching   Ultram [Tramadol Hcl] Nausea And Vomiting    MEDICATIONS: Current Outpatient Medications on File Prior to Visit  Medication Sig Dispense Refill   acetaminophen (TYLENOL) 325 MG tablet Take 650 mg by mouth every 6 (six) hours as needed (every 6  weeks before RA infusion).     allopurinol (ZYLOPRIM) 100 MG tablet Take 1 tablet (100 mg total) by mouth daily. Ov needed (Patient taking differently: Take 200 mg by mouth daily. ) 90 tablet 3   aspirin 325 MG tablet Take 325 mg by mouth daily.       Blood Glucose Monitoring Suppl (BLOOD GLUCOSE METER KIT AND SUPPLIES) KIT Dispense based on patient and insurance preference. Use up to four times daily as directed. (FOR ICD-9 250.00, 250.01). 1 each 11   Cholecalciferol (VITAMIN D3) 5000 units CAPS Take 1 capsule by mouth daily.     diclofenac sodium (VOLTAREN) 1 % GEL Apply 2 g topically 4 (four) times daily. (Patient taking differently: Apply 2 g topically 2 (two) times daily as needed (pain). ) 100 g 3   diphenhydrAMINE (BENADRYL) 25 MG tablet Take 25 mg by mouth every 6 (six) hours as needed (every 6 weeks prior to RA infusion).     DULoxetine (CYMBALTA) 30 MG capsule TAKE 1 CAPSULE BY MOUTH EVERY DAY 90 capsule 3   escitalopram (LEXAPRO) 20 MG tablet TAKE 1 TABLET BY MOUTH EVERY DAY 90 tablet 1   furosemide (LASIX) 20 MG tablet Take 1-3 tablets (20-60 mg total) by mouth daily. 200 tablet 1   glucose blood test strip Check sugar three times daily  Dx: DMII insulin dependent with retinopathy, neuropathy controlled 300 each 3   HYDROcodone-acetaminophen (NORCO) 10-325 MG tablet Take 1 tablet by mouth every 12 (twelve) hours as needed. 60 tablet 0   insulin aspart (NOVOLOG) 100 UNIT/ML injection Inject 25 Units into the skin 3 (three) times daily with meals. 10 mL 11   Insulin Syringes, Disposable, U-100 0.5 ML MISC 28 Units by Does not apply route 2 (two) times daily. 100 each 11   leflunomide (ARAVA) 20 MG tablet Take 20 mg by mouth daily.      LEVEMIR 100 UNIT/ML injection INJECT 60 UNITS AT BEDTIME AS DIRECTED (Patient taking differently: Inject 90 Units into the skin daily. ) 20 mL 1   linaclotide (LINZESS) 290 MCG CAPS capsule Take 290 mcg by mouth daily before breakfast.     Needles & Syringes MISC 1 Syringe by Does not apply route 2 (two) times daily. 100 each 11   omeprazole (PRILOSEC) 20 MG capsule TAKE 1 CAPSULE BY MOUTH EVERY DAY 90 capsule 3   oxybutynin (DITROPAN XL) 15 MG 24 hr tablet TAKE 1 TABLET BY MOUTH AT  BEDTIME 90 tablet 3   predniSONE (DELTASONE) 5 MG tablet Take 5 mg by mouth daily with breakfast. Only takes with RA Flare.     rosuvastatin (CRESTOR) 10 MG tablet Take 1 tablet (10 mg total) by mouth daily. 90 tablet 1   traZODone (DESYREL) 100 MG tablet TAKE 2 TABLETS BY MOUTH EVERY DAY AT BEDTIME 180 tablet 1   ULTICARE INSULIN SYRINGE 31G X 5/16" 0.5 ML MISC USE AS DIRECTED TO INJECT INSULIN 2 TIMES DAILY 100 each 4   No current facility-administered medications on file prior to visit.     PAST MEDICAL HISTORY: Past Medical History:  Diagnosis Date   Allergy    generic allergy pill; Spring and Fall only.   Anxiety    Arthritis    DDD lumbar, R hip OA.  s/p ortho consult in past.   Blood transfusion without reported diagnosis    Mountain climbing accident in [place].   Brachial plexus disorders    Cataract    B retractions.   Chronic  kidney disease    stage 3 per pt.    Chronic pain syndrome    Chronic renal insufficiency, stage 3 (moderate) (HCC)    Constipation    DDD (degenerative disc disease), lumbar    Depression    Diabetes mellitus    Diabetic peripheral neuropathy associated with type 2 diabetes mellitus (HCC)    Diabetic retinopathy ([place])    Diabetic retinopathy associated with type 2 diabetes mellitus ([place])    s/p laser treatment multiple.  Unable to drive.   Fatty liver    Fibromyalgia    Food allergy    GERD (gastroesophageal reflux disease)    Hypercholesteremia    Hyperlipidemia    Hypertension    controlled, off meds    IBS (irritable bowel syndrome)    Leg edema    Neuromuscular disorder (HCC)    OSA (obstructive sleep apnea)    Osteoarthritis    Rheumatic fever    Rheumatoid arthritis (HCC)    Stomach ulcer    Swallowing difficulty    TIA (transient ischemic attack)    Ulcer    Peptic ulcer H. Pylori + s/p treatment.  Upper GI diagnosed.[name] Prilosec PRN .    PAST SURGICAL HISTORY: Past Surgical  History:  Procedure Laterality Date    2 SPINAL INJECTIONS      ABDOMINAL HYSTERECTOMY  11/02/1979   DUB; cervical dysplasia; ovaries intact.   ABDOMINAL SURGERY     staph abcess    [facility] Admission     age 20; three months in [place].   BREAST BIOPSY     CARDIAC CATHETERIZATION  11/02/2007   normal coronary arteries.   CARPAL TUNNEL RELEASE     Bilateral.   CATARACT EXTRACTION, BILATERAL     CHOLECYSTECTOMY     ESOPHAGEAL MANOMETRY N/A 09/14/2017   Procedure: ESOPHAGEAL MANOMETRY (EM);  Surgeon: Ronnette Juniper, MD;  Location: [facility];  Service: Gastroenterology;  Laterality: N/A;   EYE SURGERY     Cataracts B. Laser surgery x 7 for Diabetic Retinopathy   TONSILLECTOMY      SOCIAL HISTORY: Social History   Tobacco Use   Smoking status: Former Smoker   Smokeless tobacco: Never Used   Tobacco comment: Quit 1987  Substance Use Topics   Alcohol use: No    Alcohol/week: 0.0 standard drinks   Drug use: No   FAMILY HISTORY: Family History  Adopted: Yes  Family history unknown: Yes   ROS: Review of Systems  Endo/Heme/Allergies:       Negative for hypoglycemia. Negative for polyphagia.   PHYSICAL EXAM: Pt in no acute distress  RECENT LABS AND TESTS: BMET    Component Value Date/Time   NA 143 01/02/2019 1258   K 3.9 01/02/2019 1258   CL 100 01/02/2019 1258   CO2 24 01/02/2019 1258   GLUCOSE 38 (LL) 01/02/2019 1258   GLUCOSE 58 (L) 09/08/2016 1035   BUN 31 (H) 01/02/2019 1258   CREATININE 1.43 (H) 01/02/2019 1258   CREATININE 1.30 (H) 09/08/2016 1035   CALCIUM 9.9 01/02/2019 1258   GFRNONAA 38 (L) 01/02/2019 1258   GFRNONAA 36 (L) 07/10/2014 1354   GFRAA 44 (L) 01/02/2019 1258   GFRAA 41 (L) 07/10/2014 1354   Lab Results  Component Value Date   HGBA1C 5.1 01/02/2019   HGBA1C 6.0 (H) 08/03/2018   HGBA1C 8.1 (H) 03/07/2018   HGBA1C 11.0 01/09/2018   HGBA1C 8.0 10/07/2017   Lab Results  Component Value Date  INSULIN 6.2  03/07/2018   CBC    Component Value Date/Time   WBC 5.1 01/30/2018 1000   WBC 5.7 09/08/2016 1035   RBC 3.38 (L) 01/30/2018 1000   RBC 3.82 09/08/2016 1035   HGB 10.9 (L) 01/30/2018 1000   HCT 30.8 (L) 01/30/2018 1000   PLT 226 01/30/2018 1000   MCV 91 01/30/2018 1000   MCH 32.2 01/30/2018 1000   MCH 29.8 09/08/2016 1035   MCHC 35.4 01/30/2018 1000   MCHC 32.5 09/08/2016 1035   RDW 14.8 01/30/2018 1000   LYMPHSABS 1.7 01/30/2018 1000   MONOABS 513 09/08/2016 1035   EOSABS 0.3 01/30/2018 1000   BASOSABS 0.0 01/30/2018 1000   Iron/TIBC/Ferritin/ %Sat    Component Value Date/Time   IRON 79 02/03/2016 1015   TIBC 309 02/03/2016 1015   IRONPCTSAT 26 02/03/2016 1015   Lipid Panel     Component Value Date/Time   CHOL 162 01/02/2019 1258   TRIG 225 (H) 01/02/2019 1258   HDL 37 (L) 01/02/2019 1258   CHOLHDL 3.9 08/03/2018 1209   CHOLHDL 5.3 (H) 09/08/2016 1035   VLDL 46 (H) 09/08/2016 1035   LDLCALC 80 01/02/2019 1258   Hepatic Function Panel     Component Value Date/Time   PROT 7.6 01/02/2019 1258   ALBUMIN 4.3 01/02/2019 1258   AST 23 01/02/2019 1258   ALT 16 01/02/2019 1258   ALKPHOS 108 01/02/2019 1258   BILITOT 0.3 01/02/2019 1258      Component Value Date/Time   TSH 0.958 03/07/2018 1310   TSH 0.874 01/30/2018 1000   TSH 1.060 03/23/2017 1230   Results for DOLLIE, BRESSI (MRN [id number]) as of 06/04/2019 15:26  Ref. Range 01/02/2019 12:58  Vitamin D, 25-Hydroxy Latest Ref Range: 30.0 - 100.0 ng/mL 64.9   I, Michaelene Song, am acting as Location manager for [employer], PA-C I, Abby Potash, PA-C have reviewed above note and agree with its content

## 2019-06-18 ENCOUNTER — Encounter (INDEPENDENT_AMBULATORY_CARE_PROVIDER_SITE_OTHER): Payer: Self-pay | Admitting: Physician Assistant

## 2019-06-18 ENCOUNTER — Other Ambulatory Visit: Payer: Self-pay

## 2019-06-18 ENCOUNTER — Telehealth (INDEPENDENT_AMBULATORY_CARE_PROVIDER_SITE_OTHER): Payer: Medicare Other | Admitting: Physician Assistant

## 2019-06-18 DIAGNOSIS — Z6839 Body mass index (BMI) 39.0-39.9, adult: Secondary | ICD-10-CM | POA: Diagnosis not present

## 2019-06-18 DIAGNOSIS — Z794 Long term (current) use of insulin: Secondary | ICD-10-CM | POA: Diagnosis not present

## 2019-06-18 DIAGNOSIS — E1140 Type 2 diabetes mellitus with diabetic neuropathy, unspecified: Secondary | ICD-10-CM | POA: Diagnosis not present

## 2019-06-19 DIAGNOSIS — M0609 Rheumatoid arthritis without rheumatoid factor, multiple sites: Secondary | ICD-10-CM | POA: Diagnosis not present

## 2019-06-20 NOTE — Progress Notes (Signed)
Office: [phone]  /  Fax: [phone] TeleHealth Visit:  Angel French has verbally consented to this TeleHealth visit today. The patient is located at home, the provider is located at the [employer] [facility] office. The participants in this visit include the listed provider and patient. The visit was conducted today via face time.  HPI:   Chief Complaint: OBESITY Angel French is here to discuss her progress with her obesity treatment plan. She is on the keep a food journal with 1200-1300 calories and 85 grams of protein daily and is following her eating plan approximately 50-60 % of the time. She states she is exercising 0 minutes 0 times per week. Angel French's most recent weight is 199 lbs. She is doing well on the plan, but she continues to only eat around 60-65 grams of protein. She is looking for ways to increase her protein during the day.  We were unable to weigh the patient today for this TeleHealth visit. She feels as if she has lost 6 lbs since her last visit. She has lost 17 lbs since starting treatment with [name].  Diabetes II Angel French has a diagnosis of diabetes type II. Angel French states her fasting BGs average in low 100's, when she does check them. She states BGs was in 120's this morning. Last A1c was 5.1. She is on insulin and denies hypoglycemia. She has been working on intensive lifestyle modifications including diet, exercise, and weight loss to help control her blood glucose levels.  ASSESSMENT AND PLAN:  Type 2 diabetes mellitus with diabetic neuropathy, with long-term current use of insulin (HCC)  Class 2 severe obesity with serious comorbidity and body mass index (BMI) of 39.0 to 39.9 in adult, unspecified obesity type ([place])  PLAN:  Diabetes II Angel French has been given extensive diabetes education by myself today including ideal fasting and post-prandial blood glucose readings, individual ideal Hgb A1c goals and hypoglycemia prevention. We discussed the importance of good  blood sugar control to decrease the likelihood of diabetic complications such as nephropathy, neuropathy, limb loss, blindness, coronary artery disease, and death. We discussed the importance of intensive lifestyle modification including diet, exercise and weight loss as the first line treatment for diabetes. Pharrah agrees to continue her diabetes medications and will continue with weight loss. Angel French agrees to follow up with our clinic in 2 weeks.  Obesity Angel French is currently in the action stage of change. As such, her goal is to continue with weight loss efforts She has agreed to follow the Category 2 plan Angel French has been instructed to work up to a goal of 150 minutes of combined cardio and strengthening exercise per week for weight loss and overall health benefits. We discussed the following Behavioral Modification Strategies today: increasing lean protein intake and work on meal planning and easy cooking plans Angel French is to eat more eggs and buy jerky (low sodium) for extra protein.  Angel French has agreed to follow up with our clinic in 2 weeks. She was informed of the importance of frequent follow up visits to maximize her success with intensive lifestyle modifications for her multiple health conditions.  ALLERGIES: Allergies  Allergen Reactions   Codeine Anaphylaxis   Contrast Media [Iodinated Diagnostic Agents] Anaphylaxis   Nitrofurantoin Monohyd Macro Anaphylaxis   Betadine [Povidone Iodine] Itching   Folic Acid Itching   Gabapentin Other (See Comments)    Makes patient feel drunk   Iodine Hives   Lyrica [Pregabalin] Other (See Comments)    Makes patient feel drunk  Red Dye Itching   Ultram [Tramadol Hcl] Nausea And Vomiting    MEDICATIONS: Current Outpatient Medications on File Prior to Visit  Medication Sig Dispense Refill   acetaminophen (TYLENOL) 325 MG tablet Take 650 mg by mouth every 6 (six) hours as needed (every 6 weeks before RA infusion).     allopurinol  (ZYLOPRIM) 100 MG tablet Take 1 tablet (100 mg total) by mouth daily. Ov needed (Patient taking differently: Take 200 mg by mouth daily. ) 90 tablet 3   aspirin 325 MG tablet Take 325 mg by mouth daily.      Blood Glucose Monitoring Suppl (BLOOD GLUCOSE METER KIT AND SUPPLIES) KIT Dispense based on patient and insurance preference. Use up to four times daily as directed. (FOR ICD-9 250.00, 250.01). 1 each 11   Cholecalciferol (VITAMIN D3) 5000 units CAPS Take 1 capsule by mouth daily.     diclofenac sodium (VOLTAREN) 1 % GEL Apply 2 g topically 4 (four) times daily. (Patient taking differently: Apply 2 g topically 2 (two) times daily as needed (pain). ) 100 g 3   diphenhydrAMINE (BENADRYL) 25 MG tablet Take 25 mg by mouth every 6 (six) hours as needed (every 6 weeks prior to RA infusion).     DULoxetine (CYMBALTA) 30 MG capsule TAKE 1 CAPSULE BY MOUTH EVERY DAY 90 capsule 3   escitalopram (LEXAPRO) 20 MG tablet TAKE 1 TABLET BY MOUTH EVERY DAY 90 tablet 1   furosemide (LASIX) 20 MG tablet Take 1-3 tablets (20-60 mg total) by mouth daily. 200 tablet 1   glucose blood test strip Check sugar three times daily  Dx: DMII insulin dependent with retinopathy, neuropathy controlled 300 each 3   HYDROcodone-acetaminophen (NORCO) 10-325 MG tablet Take 1 tablet by mouth every 12 (twelve) hours as needed. 60 tablet 0   insulin aspart (NOVOLOG) 100 UNIT/ML injection Inject 25 Units into the skin 3 (three) times daily with meals. 10 mL 11   Insulin Syringes, Disposable, U-100 0.5 ML MISC 28 Units by Does not apply route 2 (two) times daily. 100 each 11   leflunomide (ARAVA) 20 MG tablet Take 20 mg by mouth daily.      LEVEMIR 100 UNIT/ML injection INJECT 60 UNITS AT BEDTIME AS DIRECTED (Patient taking differently: Inject 90 Units into the skin daily. ) 20 mL 1   linaclotide (LINZESS) 290 MCG CAPS capsule Take 290 mcg by mouth daily before breakfast.     Needles & Syringes MISC 1 Syringe by Does not  apply route 2 (two) times daily. 100 each 11   omeprazole (PRILOSEC) 20 MG capsule TAKE 1 CAPSULE BY MOUTH EVERY DAY 90 capsule 3   oxybutynin (DITROPAN XL) 15 MG 24 hr tablet TAKE 1 TABLET BY MOUTH AT BEDTIME 90 tablet 3   predniSONE (DELTASONE) 5 MG tablet Take 5 mg by mouth daily with breakfast. Only takes with RA Flare.     rosuvastatin (CRESTOR) 10 MG tablet Take 1 tablet (10 mg total) by mouth daily. 90 tablet 1   traZODone (DESYREL) 100 MG tablet TAKE 2 TABLETS BY MOUTH EVERY DAY AT BEDTIME 180 tablet 1   ULTICARE INSULIN SYRINGE 31G X 5/16" 0.5 ML MISC USE AS DIRECTED TO INJECT INSULIN 2 TIMES DAILY 100 each 4   No current facility-administered medications on file prior to visit.     PAST MEDICAL HISTORY: Past Medical History:  Diagnosis Date   Allergy    generic allergy pill; Spring and Fall only.   Anxiety  Arthritis    DDD lumbar, R hip OA.  s/p ortho consult in past.   Blood transfusion without reported diagnosis    Mountain climbing accident in [place].   Brachial plexus disorders    Cataract    B retractions.   Chronic kidney disease    stage 3 per pt.    Chronic pain syndrome    Chronic renal insufficiency, stage 3 (moderate) (HCC)    Constipation    DDD (degenerative disc disease), lumbar    Depression    Diabetes mellitus    Diabetic peripheral neuropathy associated with type 2 diabetes mellitus (HCC)    Diabetic retinopathy ([place])    Diabetic retinopathy associated with type 2 diabetes mellitus ([place])    s/p laser treatment multiple.  Unable to drive.   Fatty liver    Fibromyalgia    Food allergy    GERD (gastroesophageal reflux disease)    Hypercholesteremia    Hyperlipidemia    Hypertension    controlled, off meds    IBS (irritable bowel syndrome)    Leg edema    Neuromuscular disorder (HCC)    OSA (obstructive sleep apnea)    Osteoarthritis    Rheumatic fever    Rheumatoid arthritis (HCC)    Stomach ulcer     Swallowing difficulty    TIA (transient ischemic attack)    Ulcer    Peptic ulcer H. Pylori + s/p treatment.  Upper GI diagnosed.[name] Prilosec PRN .    PAST SURGICAL HISTORY: Past Surgical History:  Procedure Laterality Date    2 SPINAL INJECTIONS      ABDOMINAL HYSTERECTOMY  11/02/1979   DUB; cervical dysplasia; ovaries intact.   ABDOMINAL SURGERY     staph abcess    [facility] Admission     age 75; three months in [place].   BREAST BIOPSY     CARDIAC CATHETERIZATION  11/02/2007   normal coronary arteries.   CARPAL TUNNEL RELEASE     Bilateral.   CATARACT EXTRACTION, BILATERAL     CHOLECYSTECTOMY     ESOPHAGEAL MANOMETRY N/A 09/14/2017   Procedure: ESOPHAGEAL MANOMETRY (EM);  Surgeon: Ronnette Juniper, MD;  Location: [facility];  Service: Gastroenterology;  Laterality: N/A;   EYE SURGERY     Cataracts B. Laser surgery x 7 for Diabetic Retinopathy   TONSILLECTOMY      SOCIAL HISTORY: Social History   Tobacco Use   Smoking status: Former Smoker   Smokeless tobacco: Never Used   Tobacco comment: Quit 1987  Substance Use Topics   Alcohol use: No    Alcohol/week: 0.0 standard drinks   Drug use: No    FAMILY HISTORY: Family History  Adopted: Yes  Family history unknown: Yes    ROS: Review of Systems  Constitutional: Positive for weight loss.  Endo/Heme/Allergies:       Negative hypoglycemia    PHYSICAL EXAM: Pt in no acute distress  RECENT LABS AND TESTS: BMET    Component Value Date/Time   NA 143 01/02/2019 1258   K 3.9 01/02/2019 1258   CL 100 01/02/2019 1258   CO2 24 01/02/2019 1258   GLUCOSE 38 (LL) 01/02/2019 1258   GLUCOSE 58 (L) 09/08/2016 1035   BUN 31 (H) 01/02/2019 1258   CREATININE 1.43 (H) 01/02/2019 1258   CREATININE 1.30 (H) 09/08/2016 1035   CALCIUM 9.9 01/02/2019 1258   GFRNONAA 38 (L) 01/02/2019 1258   GFRNONAA 36 (L) 07/10/2014 1354   GFRAA 44 (L) 01/02/2019 1258  GFRAA 41 (L) 07/10/2014 1354   Lab  Results  Component Value Date   HGBA1C 5.1 01/02/2019   HGBA1C 6.0 (H) 08/03/2018   HGBA1C 8.1 (H) 03/07/2018   HGBA1C 11.0 01/09/2018   HGBA1C 8.0 10/07/2017   Lab Results  Component Value Date   INSULIN 6.2 03/07/2018   CBC    Component Value Date/Time   WBC 5.1 01/30/2018 1000   WBC 5.7 09/08/2016 1035   RBC 3.38 (L) 01/30/2018 1000   RBC 3.82 09/08/2016 1035   HGB 10.9 (L) 01/30/2018 1000   HCT 30.8 (L) 01/30/2018 1000   PLT 226 01/30/2018 1000   MCV 91 01/30/2018 1000   MCH 32.2 01/30/2018 1000   MCH 29.8 09/08/2016 1035   MCHC 35.4 01/30/2018 1000   MCHC 32.5 09/08/2016 1035   RDW 14.8 01/30/2018 1000   LYMPHSABS 1.7 01/30/2018 1000   MONOABS 513 09/08/2016 1035   EOSABS 0.3 01/30/2018 1000   BASOSABS 0.0 01/30/2018 1000   Iron/TIBC/Ferritin/ %Sat    Component Value Date/Time   IRON 79 02/03/2016 1015   TIBC 309 02/03/2016 1015   IRONPCTSAT 26 02/03/2016 1015   Lipid Panel     Component Value Date/Time   CHOL 162 01/02/2019 1258   TRIG 225 (H) 01/02/2019 1258   HDL 37 (L) 01/02/2019 1258   CHOLHDL 3.9 08/03/2018 1209   CHOLHDL 5.3 (H) 09/08/2016 1035   VLDL 46 (H) 09/08/2016 1035   LDLCALC 80 01/02/2019 1258   Hepatic Function Panel     Component Value Date/Time   PROT 7.6 01/02/2019 1258   ALBUMIN 4.3 01/02/2019 1258   AST 23 01/02/2019 1258   ALT 16 01/02/2019 1258   ALKPHOS 108 01/02/2019 1258   BILITOT 0.3 01/02/2019 1258      Component Value Date/Time   TSH 0.958 03/07/2018 1310   TSH 0.874 01/30/2018 1000   TSH 1.060 03/23/2017 1230      I, Trixie Dredge, am acting as Location manager for Abby Potash, PA-C I, Abby Potash, PA-C have reviewed above note and agree with its content

## 2019-06-25 ENCOUNTER — Ambulatory Visit: Payer: Self-pay | Admitting: Adult Health

## 2019-06-26 ENCOUNTER — Encounter: Payer: Self-pay | Admitting: Neurology

## 2019-06-28 ENCOUNTER — Other Ambulatory Visit: Payer: Self-pay

## 2019-06-28 ENCOUNTER — Ambulatory Visit (INDEPENDENT_AMBULATORY_CARE_PROVIDER_SITE_OTHER): Payer: Medicare Other | Admitting: Adult Health

## 2019-06-28 ENCOUNTER — Encounter: Payer: Self-pay | Admitting: Adult Health

## 2019-06-28 VITALS — BP 133/79 | HR 95 | Temp 98.0°F | Ht 62.0 in | Wt 212.4 lb

## 2019-06-28 DIAGNOSIS — G4733 Obstructive sleep apnea (adult) (pediatric): Secondary | ICD-10-CM

## 2019-06-28 DIAGNOSIS — Z9989 Dependence on other enabling machines and devices: Secondary | ICD-10-CM | POA: Diagnosis not present

## 2019-06-28 NOTE — Progress Notes (Signed)
PATIENT: Angel French DOB: [birth year]  REASON FOR VISIT: follow up HISTORY FROM: patient  HISTORY OF PRESENT ILLNESS: Today 06/28/19: [name] is a 68 year old female with a history of obstructive sleep apnea on CPAP.  Her download indicates that she uses her machine 29 out of 30 days for compliance of 97%.  She uses her machine greater than 4 hours 28 days for compliance of 93%.  On average she uses her machine 6 hours and 29 minutes.  Her residual AHI is 1.1 on 5 to 15 cm of water with EPR of 3.  Her leak in the 95th percentile is 10.5 liters per minute.  She states the CPAP works well with the exception of her mask.  She states that the mask will not stay on her face and she is often having to readjust it at night.  Other than that she has done well with the machine.   REVIEW OF SYSTEMS: Out of a complete 14 system review of symptoms, the patient complains only of the following symptoms, and all other reviewed systems are negative.  Fatigue severity score 23 Epworth sleepiness score 4  ALLERGIES: Allergies  Allergen Reactions  . Codeine Anaphylaxis  . Contrast Media [Iodinated Diagnostic Agents] Anaphylaxis  . Nitrofurantoin Monohyd Macro Anaphylaxis  . Betadine [Povidone Iodine] Itching  . Folic Acid Itching  . Gabapentin Other (See Comments)    Makes patient feel drunk  . Iodine Hives  . Lyrica [Pregabalin] Other (See Comments)    Makes patient feel drunk  . Red Dye Itching  . Ultram [Tramadol Hcl] Nausea And Vomiting    HOME MEDICATIONS: Outpatient Medications Prior to Visit  Medication Sig Dispense Refill  . acetaminophen (TYLENOL) 325 MG tablet Take 650 mg by mouth every 6 (six) hours as needed (every 6 weeks before RA infusion).    Marland Kitchen allopurinol (ZYLOPRIM) 100 MG tablet Take 1 tablet (100 mg total) by mouth daily. Ov needed (Patient taking differently: Take 200 mg by mouth daily. ) 90 tablet 3  . aspirin 325 MG tablet Take 325 mg by mouth daily.     . Blood  Glucose Monitoring Suppl (BLOOD GLUCOSE METER KIT AND SUPPLIES) KIT Dispense based on patient and insurance preference. Use up to four times daily as directed. (FOR ICD-9 250.00, 250.01). 1 each 11  . Cholecalciferol (VITAMIN D3) 5000 units CAPS Take 1 capsule by mouth daily.    . diclofenac sodium (VOLTAREN) 1 % GEL Apply 2 g topically 4 (four) times daily. (Patient taking differently: Apply 2 g topically 2 (two) times daily as needed (pain). ) 100 g 3  . diphenhydrAMINE (BENADRYL) 25 MG tablet Take 25 mg by mouth every 6 (six) hours as needed (every 6 weeks prior to RA infusion).    . DULoxetine (CYMBALTA) 30 MG capsule TAKE 1 CAPSULE BY MOUTH EVERY DAY 90 capsule 3  . escitalopram (LEXAPRO) 20 MG tablet TAKE 1 TABLET BY MOUTH EVERY DAY 90 tablet 1  . furosemide (LASIX) 20 MG tablet Take 1-3 tablets (20-60 mg total) by mouth daily. 200 tablet 1  . glucose blood test strip Check sugar three times daily  Dx: DMII insulin dependent with retinopathy, neuropathy controlled 300 each 3  . HYDROcodone-acetaminophen (NORCO) 10-325 MG tablet Take 1 tablet by mouth every 12 (twelve) hours as needed. (Patient taking differently: Take 1 tablet by mouth every 8 (eight) hours as needed. ) 60 tablet 0  . insulin aspart (NOVOLOG) 100 UNIT/ML injection Inject 25 Units into  the skin 3 (three) times daily with meals. 10 mL 11  . Insulin Syringes, Disposable, U-100 0.5 ML MISC 28 Units by Does not apply route 2 (two) times daily. 100 each 11  . leflunomide (ARAVA) 20 MG tablet Take 20 mg by mouth daily.     Marland Kitchen LEVEMIR 100 UNIT/ML injection INJECT 60 UNITS AT BEDTIME AS DIRECTED (Patient taking differently: Inject 90 Units into the skin daily. ) 20 mL 1  . linaclotide (LINZESS) 290 MCG CAPS capsule Take 290 mcg by mouth daily before breakfast.    . Needles & Syringes MISC 1 Syringe by Does not apply route 2 (two) times daily. 100 each 11  . omeprazole (PRILOSEC) 20 MG capsule TAKE 1 CAPSULE BY MOUTH EVERY DAY 90 capsule 3   . oxybutynin (DITROPAN XL) 15 MG 24 hr tablet TAKE 1 TABLET BY MOUTH AT BEDTIME 90 tablet 3  . rosuvastatin (CRESTOR) 10 MG tablet Take 1 tablet (10 mg total) by mouth daily. 90 tablet 1  . traZODone (DESYREL) 100 MG tablet TAKE 2 TABLETS BY MOUTH EVERY DAY AT BEDTIME 180 tablet 1  . ULTICARE INSULIN SYRINGE 31G X 5/16" 0.5 ML MISC USE AS DIRECTED TO INJECT INSULIN 2 TIMES DAILY 100 each 4  . predniSONE (DELTASONE) 5 MG tablet Take 5 mg by mouth daily with breakfast. Only takes with RA Flare.     No facility-administered medications prior to visit.     PAST MEDICAL HISTORY: Past Medical History:  Diagnosis Date  . Allergy    generic allergy pill; Spring and Fall only.  . Anxiety   . Arthritis    DDD lumbar, R hip OA.  s/p ortho consult in past.  . Blood transfusion without reported diagnosis    Mountain climbing accident in [place].  . Brachial plexus disorders   . Cataract    B retractions.  . Chronic kidney disease    stage 3 per pt.   . Chronic pain syndrome   . Chronic renal insufficiency, stage 3 (moderate) (HCC)   . Constipation   . DDD (degenerative disc disease), lumbar   . Depression   . Diabetes mellitus   . Diabetic peripheral neuropathy associated with type 2 diabetes mellitus ([place])   . Diabetic retinopathy ([place])   . Diabetic retinopathy associated with type 2 diabetes mellitus ([place])    s/p laser treatment multiple.  Unable to drive.  . Fatty liver   . Fibromyalgia   . Food allergy   . GERD (gastroesophageal reflux disease)   . Hypercholesteremia   . Hyperlipidemia   . Hypertension    controlled, off meds   . IBS (irritable bowel syndrome)   . Leg edema   . Neuromuscular disorder ([place])   . OSA (obstructive sleep apnea)   . Osteoarthritis   . Rheumatic fever   . Rheumatoid arthritis ([place])   . Stomach ulcer   . Swallowing difficulty   . TIA (transient ischemic attack)   . Ulcer    Peptic ulcer H. Pylori + s/p treatment.  Upper GI diagnosed.[name]  Prilosec PRN .    PAST SURGICAL HISTORY: Past Surgical History:  Procedure Laterality Date  .  2 SPINAL INJECTIONS     . ABDOMINAL HYSTERECTOMY  11/02/1979   DUB; cervical dysplasia; ovaries intact.  . ABDOMINAL SURGERY     staph abcess   . [facility]     age 36; three months in [place].  Marland Kitchen BREAST BIOPSY    .  CARDIAC CATHETERIZATION  11/02/2007   normal coronary arteries.  . CARPAL TUNNEL RELEASE     Bilateral.  . CATARACT EXTRACTION, BILATERAL    . CHOLECYSTECTOMY    . ESOPHAGEAL MANOMETRY N/A 09/14/2017   Procedure: ESOPHAGEAL MANOMETRY (EM);  Surgeon: Ronnette Juniper, MD;  Location: [facility];  Service: Gastroenterology;  Laterality: N/A;  . EYE SURGERY     Cataracts B. Laser surgery x 7 for Diabetic Retinopathy  . TONSILLECTOMY      FAMILY HISTORY: Family History  Adopted: Yes  Family history unknown: Yes    SOCIAL HISTORY: Social History   Socioeconomic History  . Marital status: Widowed    Spouse name: Marijean Niemann  . Number of children: 2  . Years of education: college  . Highest education level: Not on file  Occupational History  . Occupation: retired    Comment: retretied  Social Needs  . Financial resource strain: Not on file  . Food insecurity    Worry: Not on file    Inability: Not on file  . Transportation needs    Medical: Not on file    Non-medical: Not on file  Tobacco Use  . Smoking status: Former Research scientist (life sciences)  . Smokeless tobacco: Never Used  . Tobacco comment: Quit 1987  Substance and Sexual Activity  . Alcohol use: No    Alcohol/week: 0.0 standard drinks  . Drug use: No  . Sexual activity: Yes    Birth control/protection: Surgical, Post-menopausal    Comment: widow  Lifestyle  . Physical activity    Days per week: Not on file    Minutes per session: Not on file  . Stress: Not on file  Relationships  . Social Herbalist on phone: Not on file    Gets together: Not on file    Attends religious service: Not  on file    Active member of club or organization: Not on file    Attends meetings of clubs or organizations: Not on file    Relationship status: Not on file  . Intimate partner violence    Fear of current or ex partner: Not on file    Emotionally abused: Not on file    Physically abused: Not on file    Forced sexual activity: Not on file  Other Topics Concern  . Not on file  Social History Narrative   Marital status: widowed since 2009; dating x 6 years.  Happy; no abuse.      Children: 2 children (58 daughter, 76 son estranged); 2 grandchildren.      Lives: with boyfriend, daughter, granddaughter, friend of daughter.  Lives in pt house.      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  Diabetic retinopathy; unable to drive.      Tobacco:  Smoked x 20 years; quit 20 years.      Alcohol:  On special occasions; once per week on average.       Drugs:  None since college.      Exercise:  Walking several times per week; walks the dog.   Education college   Caffeine one cup daily.   Right handed      Advanced Directives: none; FULL CODE.  DNR/DNI.  HCPOA: [place]?              PHYSICAL EXAM  Vitals:   06/28/19 1253  BP: 133/79  Pulse: 95  Temp: 98 F (36.7 C)  Weight: 212 lb 6.4 oz (96.3  kg)  Height: '5\' 2"'$  (1.575 m)   Body mass index is 38.85 kg/m.  Generalized: Well developed, in no acute distress  Chest: Lungs clear to auscultation bilaterally   Neurological examination  Mentation: Alert oriented to time, place, history taking. Follows all commands speech and language fluent Cranial nerve II-XII: Pupils were equal round reactive to light. Extraocular movements were full, visual field were full on confrontational test. Head turning and shoulder shrug  were normal and symmetric. Motor: The motor testing reveals 5 over 5 strength of all 4 extremities. Good symmetric motor tone is noted throughout.  Sensory: Sensory testing is intact to soft touch on all  4 extremities. No evidence of extinction is noted.  Coordination: Cerebellar testing reveals good finger-nose-finger and heel-to-shin bilaterally.  Gait and station: Gait is normal.   DIAGNOSTIC DATA (LABS, IMAGING, TESTING) - I reviewed patient records, labs, notes, testing and imaging myself where available.  Lab Results  Component Value Date   WBC 5.1 01/30/2018   HGB 10.9 (L) 01/30/2018   HCT 30.8 (L) 01/30/2018   MCV 91 01/30/2018   PLT 226 01/30/2018      Component Value Date/Time   NA 143 01/02/2019 1258   K 3.9 01/02/2019 1258   CL 100 01/02/2019 1258   CO2 24 01/02/2019 1258   GLUCOSE 38 (LL) 01/02/2019 1258   GLUCOSE 58 (L) 09/08/2016 1035   BUN 31 (H) 01/02/2019 1258   CREATININE 1.43 (H) 01/02/2019 1258   CREATININE 1.30 (H) 09/08/2016 1035   CALCIUM 9.9 01/02/2019 1258   PROT 7.6 01/02/2019 1258   ALBUMIN 4.3 01/02/2019 1258   AST 23 01/02/2019 1258   ALT 16 01/02/2019 1258   ALKPHOS 108 01/02/2019 1258   BILITOT 0.3 01/02/2019 1258   GFRNONAA 38 (L) 01/02/2019 1258   GFRNONAA 36 (L) 07/10/2014 1354   GFRAA 44 (L) 01/02/2019 1258   GFRAA 41 (L) 07/10/2014 1354   Lab Results  Component Value Date   CHOL 162 01/02/2019   HDL 37 (L) 01/02/2019   LDLCALC 80 01/02/2019   TRIG 225 (H) 01/02/2019   CHOLHDL 3.9 08/03/2018   Lab Results  Component Value Date   HGBA1C 5.1 01/02/2019   Lab Results  Component Value Date   VITAMINB12 466 01/30/2018   Lab Results  Component Value Date   TSH 0.958 03/07/2018      ASSESSMENT AND PLAN 68 y.o. year old female  has a past medical history of Allergy, Anxiety, Arthritis, Blood transfusion without reported diagnosis, Brachial plexus disorders, Cataract, Chronic kidney disease, Chronic pain syndrome, Chronic renal insufficiency, stage 3 (moderate) (HCC), Constipation, DDD (degenerative disc disease), lumbar, Depression, Diabetes mellitus, Diabetic peripheral neuropathy associated with type 2 diabetes mellitus  ([place]), Diabetic retinopathy ([place]), Diabetic retinopathy associated with type 2 diabetes mellitus ([place]), Fatty liver, Fibromyalgia, Food allergy, GERD (gastroesophageal reflux disease), Hypercholesteremia, Hyperlipidemia, Hypertension, IBS (irritable bowel syndrome), Leg edema, Neuromuscular disorder ([place]), OSA (obstructive sleep apnea), Osteoarthritis, Rheumatic fever, Rheumatoid arthritis ([place]), Stomach ulcer, Swallowing difficulty, TIA (transient ischemic attack), and Ulcer. here with:  1.  Obstructive sleep apnea on CPAP  The patient CPAP download shows excellent compliance and good treatment of her apnea.  She will be sent for a mask refitting.  She is encouraged to continue using the CPAP nightly and greater than 4 hours each night.  She will follow-up in 1 year or sooner if needed.   I spent 15 minutes with the patient. 50% of this time was spent reviewing CPAP download  Ward Givens, MSN, NP-C 06/28/2019, 1:25 PM [facility] [address] [place], [place] [address] [phone]

## 2019-06-28 NOTE — Patient Instructions (Signed)
Continue using CPAP nightly and greater than 4 hours each night °If your symptoms worsen or you develop new symptoms please let us know.  ° °

## 2019-07-02 ENCOUNTER — Other Ambulatory Visit: Payer: Self-pay

## 2019-07-02 ENCOUNTER — Encounter (INDEPENDENT_AMBULATORY_CARE_PROVIDER_SITE_OTHER): Payer: Self-pay | Admitting: Physician Assistant

## 2019-07-02 ENCOUNTER — Telehealth (INDEPENDENT_AMBULATORY_CARE_PROVIDER_SITE_OTHER): Payer: Medicare Other | Admitting: Physician Assistant

## 2019-07-02 DIAGNOSIS — E1140 Type 2 diabetes mellitus with diabetic neuropathy, unspecified: Secondary | ICD-10-CM | POA: Diagnosis not present

## 2019-07-02 DIAGNOSIS — Z794 Long term (current) use of insulin: Secondary | ICD-10-CM | POA: Diagnosis not present

## 2019-07-02 DIAGNOSIS — Z6838 Body mass index (BMI) 38.0-38.9, adult: Secondary | ICD-10-CM | POA: Diagnosis not present

## 2019-07-02 NOTE — Progress Notes (Signed)
Received order for CPAP orders 07-02-19 sy

## 2019-07-03 NOTE — Progress Notes (Signed)
Office: [phone]  /  Fax: [phone] TeleHealth Visit:  Angel French has verbally consented to this TeleHealth visit today. The patient is located at home, the provider is located at the [facility] office. The participants in this visit include the listed provider and patient and any and all parties involved. The visit was conducted today via FaceTIme.  HPI:   Chief Complaint: OBESITY Angel French is here to discuss her progress with her obesity treatment plan. She is on the keep a food journal with 1200 to 1300 calories and 85 grams of protein daily plan and is following her eating plan approximately 60 % of the time. She states she is exercising 0 minutes 0 times per week. Angel French's most recent weight was 201 pounds (07/02/19). She reports that she has been on prednisone, due to increased pain. She is still only getting about 60 to 65 grams of protein daily. We were unable to weigh the patient today for this TeleHealth visit. She feels as if she has gained weight since her last visit. She has lost 17 lbs since starting treatment with [name].  Diabetes II Angel French has a diagnosis of diabetes type II. Angel French states fasting BGs range in the low 100's . Angel French is on insulin and she denies any hypoglycemic episodes. Last A1c was at 5.1 She has been working on intensive lifestyle modifications including diet, exercise, and weight loss to help control her blood glucose levels.  ASSESSMENT AND PLAN:  Type 2 diabetes mellitus with diabetic neuropathy, with long-term current use of insulin (HCC)  Class 2 severe obesity with serious comorbidity and body mass index (BMI) of 38.0 to 38.9 in adult, unspecified obesity type ([place])  PLAN:  Diabetes II Trana has been given extensive diabetes education by myself today including ideal fasting and post-prandial blood glucose readings, individual ideal Hgb A1c goals and hypoglycemia prevention. We discussed the importance of good blood sugar  control to decrease the likelihood of diabetic complications such as nephropathy, neuropathy, limb loss, blindness, coronary artery disease, and death. We discussed the importance of intensive lifestyle modification including diet, exercise and weight loss as the first line treatment for diabetes. Starlina agrees to continue her diabetes medications and weight loss, and she will follow up at the agreed upon time.  Obesity Angel French is currently in the action stage of change. As such, her goal is to continue with weight loss efforts She has agreed to keep a food journal with 1200 to 1300 calories and 85 grams of protein daily Angel French has been instructed to work up to a goal of 150 minutes of combined cardio and strengthening exercise per week for weight loss and overall health benefits. We discussed the following Behavioral Modification Strategies today: increasing lean protein intake and work on meal planning and easy cooking plans  Angel French has agreed to follow up with our clinic in 2 weeks. She was informed of the importance of frequent follow up visits to maximize her success with intensive lifestyle modifications for her multiple health conditions.  I spent > than 50% of the 25 minute visit on counseling as documented in the note.    ALLERGIES: Allergies  Allergen Reactions   Codeine Anaphylaxis   Contrast Media [Iodinated Diagnostic Agents] Anaphylaxis   Nitrofurantoin Monohyd Macro Anaphylaxis   Betadine [Povidone Iodine] Itching   Folic Acid Itching   Gabapentin Other (See Comments)    Makes patient feel drunk   Iodine Hives   Lyrica [Pregabalin] Other (See Comments)  Makes patient feel drunk   Red Dye Itching   Ultram [Tramadol Hcl] Nausea And Vomiting    MEDICATIONS: Current Outpatient Medications on File Prior to Visit  Medication Sig Dispense Refill   acetaminophen (TYLENOL) 325 MG tablet Take 650 mg by mouth every 6 (six) hours as needed (every 6 weeks before RA  infusion).     allopurinol (ZYLOPRIM) 100 MG tablet Take 1 tablet (100 mg total) by mouth daily. Ov needed (Patient taking differently: Take 200 mg by mouth daily. ) 90 tablet 3   aspirin 325 MG tablet Take 325 mg by mouth daily.      Blood Glucose Monitoring Suppl (BLOOD GLUCOSE METER KIT AND SUPPLIES) KIT Dispense based on patient and insurance preference. Use up to four times daily as directed. (FOR ICD-9 250.00, 250.01). 1 each 11   Cholecalciferol (VITAMIN D3) 5000 units CAPS Take 1 capsule by mouth daily.     diclofenac sodium (VOLTAREN) 1 % GEL Apply 2 g topically 4 (four) times daily. (Patient taking differently: Apply 2 g topically 2 (two) times daily as needed (pain). ) 100 g 3   diphenhydrAMINE (BENADRYL) 25 MG tablet Take 25 mg by mouth every 6 (six) hours as needed (every 6 weeks prior to RA infusion).     DULoxetine (CYMBALTA) 30 MG capsule TAKE 1 CAPSULE BY MOUTH EVERY DAY 90 capsule 3   escitalopram (LEXAPRO) 20 MG tablet TAKE 1 TABLET BY MOUTH EVERY DAY 90 tablet 1   furosemide (LASIX) 20 MG tablet Take 1-3 tablets (20-60 mg total) by mouth daily. 200 tablet 1   glucose blood test strip Check sugar three times daily  Dx: DMII insulin dependent with retinopathy, neuropathy controlled 300 each 3   HYDROcodone-acetaminophen (NORCO) 10-325 MG tablet Take 1 tablet by mouth every 12 (twelve) hours as needed. (Patient taking differently: Take 1 tablet by mouth every 8 (eight) hours as needed. ) 60 tablet 0   insulin aspart (NOVOLOG) 100 UNIT/ML injection Inject 25 Units into the skin 3 (three) times daily with meals. 10 mL 11   Insulin Syringes, Disposable, U-100 0.5 ML MISC 28 Units by Does not apply route 2 (two) times daily. 100 each 11   leflunomide (ARAVA) 20 MG tablet Take 20 mg by mouth daily.      LEVEMIR 100 UNIT/ML injection INJECT 60 UNITS AT BEDTIME AS DIRECTED (Patient taking differently: Inject 90 Units into the skin daily. ) 20 mL 1   linaclotide (LINZESS) 290  MCG CAPS capsule Take 290 mcg by mouth daily before breakfast.     Needles & Syringes MISC 1 Syringe by Does not apply route 2 (two) times daily. 100 each 11   omeprazole (PRILOSEC) 20 MG capsule TAKE 1 CAPSULE BY MOUTH EVERY DAY 90 capsule 3   oxybutynin (DITROPAN XL) 15 MG 24 hr tablet TAKE 1 TABLET BY MOUTH AT BEDTIME 90 tablet 3   predniSONE (DELTASONE) 5 MG tablet Take 5 mg by mouth daily with breakfast. Only takes with RA Flare.     rosuvastatin (CRESTOR) 10 MG tablet Take 1 tablet (10 mg total) by mouth daily. 90 tablet 1   traZODone (DESYREL) 100 MG tablet TAKE 2 TABLETS BY MOUTH EVERY DAY AT BEDTIME 180 tablet 1   ULTICARE INSULIN SYRINGE 31G X 5/16" 0.5 ML MISC USE AS DIRECTED TO INJECT INSULIN 2 TIMES DAILY 100 each 4   No current facility-administered medications on file prior to visit.     PAST MEDICAL HISTORY: Past Medical History:  Diagnosis Date   Allergy    generic allergy pill; Spring and Fall only.   Anxiety    Arthritis    DDD lumbar, R hip OA.  s/p ortho consult in past.   Blood transfusion without reported diagnosis    Mountain climbing accident in [place].   Brachial plexus disorders    Cataract    B retractions.   Chronic kidney disease    stage 3 per pt.    Chronic pain syndrome    Chronic renal insufficiency, stage 3 (moderate) (HCC)    Constipation    DDD (degenerative disc disease), lumbar    Depression    Diabetes mellitus    Diabetic peripheral neuropathy associated with type 2 diabetes mellitus (HCC)    Diabetic retinopathy ([place])    Diabetic retinopathy associated with type 2 diabetes mellitus ([place])    s/p laser treatment multiple.  Unable to drive.   Fatty liver    Fibromyalgia    Food allergy    GERD (gastroesophageal reflux disease)    Hypercholesteremia    Hyperlipidemia    Hypertension    controlled, off meds    IBS (irritable bowel syndrome)    Leg edema    Neuromuscular disorder (HCC)    OSA  (obstructive sleep apnea)    Osteoarthritis    Rheumatic fever    Rheumatoid arthritis (HCC)    Stomach ulcer    Swallowing difficulty    TIA (transient ischemic attack)    Ulcer    Peptic ulcer H. Pylori + s/p treatment.  Upper GI diagnosed.[name] Prilosec PRN .    PAST SURGICAL HISTORY: Past Surgical History:  Procedure Laterality Date    2 SPINAL INJECTIONS      ABDOMINAL HYSTERECTOMY  11/02/1979   DUB; cervical dysplasia; ovaries intact.   ABDOMINAL SURGERY     staph abcess    [facility] Admission     age 65; three months in [place].   BREAST BIOPSY     CARDIAC CATHETERIZATION  11/02/2007   normal coronary arteries.   CARPAL TUNNEL RELEASE     Bilateral.   CATARACT EXTRACTION, BILATERAL     CHOLECYSTECTOMY     ESOPHAGEAL MANOMETRY N/A 09/14/2017   Procedure: ESOPHAGEAL MANOMETRY (EM);  Surgeon: Ronnette Juniper, MD;  Location: [facility];  Service: Gastroenterology;  Laterality: N/A;   EYE SURGERY     Cataracts B. Laser surgery x 7 for Diabetic Retinopathy   TONSILLECTOMY      SOCIAL HISTORY: Social History   Tobacco Use   Smoking status: Former Smoker   Smokeless tobacco: Never Used   Tobacco comment: Quit 1987  Substance Use Topics   Alcohol use: No    Alcohol/week: 0.0 standard drinks   Drug use: No    FAMILY HISTORY: Family History  Adopted: Yes  Family history unknown: Yes    ROS: Review of Systems  Constitutional: Negative for weight loss.  Endo/Heme/Allergies:       Negative for hypoglycemia    PHYSICAL EXAM: Pt in no acute distress  RECENT LABS AND TESTS: BMET    Component Value Date/Time   NA 143 01/02/2019 1258   K 3.9 01/02/2019 1258   CL 100 01/02/2019 1258   CO2 24 01/02/2019 1258   GLUCOSE 38 (LL) 01/02/2019 1258   GLUCOSE 58 (L) 09/08/2016 1035   BUN 31 (H) 01/02/2019 1258   CREATININE 1.43 (H) 01/02/2019 1258   CREATININE 1.30 (H) 09/08/2016 1035   CALCIUM 9.9 01/02/2019  [place]  (L) 01/02/2019 1258   GFRNONAA 36 (L) 07/10/2014 1354   GFRAA 44 (L) 01/02/2019 1258   GFRAA 41 (L) 07/10/2014 1354   Lab Results  Component Value Date   HGBA1C 5.1 01/02/2019   HGBA1C 6.0 (H) 08/03/2018   HGBA1C 8.1 (H) 03/07/2018   HGBA1C 11.0 01/09/2018   HGBA1C 8.0 10/07/2017   Lab Results  Component Value Date   INSULIN 6.2 03/07/2018   CBC    Component Value Date/Time   WBC 5.1 01/30/2018 1000   WBC 5.7 09/08/2016 1035   RBC 3.38 (L) 01/30/2018 1000   RBC 3.82 09/08/2016 1035   HGB 10.9 (L) 01/30/2018 1000   HCT 30.8 (L) 01/30/2018 1000   PLT 226 01/30/2018 1000   MCV 91 01/30/2018 1000   MCH 32.2 01/30/2018 1000   MCH 29.8 09/08/2016 1035   MCHC 35.4 01/30/2018 1000   MCHC 32.5 09/08/2016 1035   RDW 14.8 01/30/2018 1000   LYMPHSABS 1.7 01/30/2018 1000   MONOABS 513 09/08/2016 1035   EOSABS 0.3 01/30/2018 1000   BASOSABS 0.0 01/30/2018 1000   Iron/TIBC/Ferritin/ %Sat    Component Value Date/Time   IRON 79 02/03/2016 1015   TIBC 309 02/03/2016 1015   IRONPCTSAT 26 02/03/2016 1015   Lipid Panel     Component Value Date/Time   CHOL 162 01/02/2019 1258   TRIG 225 (H) 01/02/2019 1258   HDL 37 (L) 01/02/2019 1258   CHOLHDL 3.9 08/03/2018 1209   CHOLHDL 5.3 (H) 09/08/2016 1035   VLDL 46 (H) 09/08/2016 1035   LDLCALC 80 01/02/2019 1258   Hepatic Function Panel     Component Value Date/Time   PROT 7.6 01/02/2019 1258   ALBUMIN 4.3 01/02/2019 1258   AST 23 01/02/2019 1258   ALT 16 01/02/2019 1258   ALKPHOS 108 01/02/2019 1258   BILITOT 0.3 01/02/2019 1258      Component Value Date/Time   TSH 0.958 03/07/2018 1310   TSH 0.874 01/30/2018 1000   TSH 1.060 03/23/2017 1230     Ref. Range 01/02/2019 12:58  Vitamin D, 25-Hydroxy Latest Ref Range: 30.0 - 100.0 ng/mL 64.9    I, Doreene Nest, am acting as Location manager for Abby Potash, PA-C I, Abby Potash, PA-C have reviewed above note and agree with its content

## 2019-07-13 DIAGNOSIS — Z794 Long term (current) use of insulin: Secondary | ICD-10-CM | POA: Diagnosis not present

## 2019-07-13 DIAGNOSIS — K219 Gastro-esophageal reflux disease without esophagitis: Secondary | ICD-10-CM | POA: Diagnosis not present

## 2019-07-13 DIAGNOSIS — I878 Other specified disorders of veins: Secondary | ICD-10-CM | POA: Diagnosis not present

## 2019-07-13 DIAGNOSIS — Z23 Encounter for immunization: Secondary | ICD-10-CM | POA: Diagnosis not present

## 2019-07-13 DIAGNOSIS — E1121 Type 2 diabetes mellitus with diabetic nephropathy: Secondary | ICD-10-CM | POA: Diagnosis not present

## 2019-07-13 DIAGNOSIS — G894 Chronic pain syndrome: Secondary | ICD-10-CM | POA: Diagnosis not present

## 2019-07-13 DIAGNOSIS — E7800 Pure hypercholesterolemia, unspecified: Secondary | ICD-10-CM | POA: Diagnosis not present

## 2019-07-16 ENCOUNTER — Encounter (INDEPENDENT_AMBULATORY_CARE_PROVIDER_SITE_OTHER): Payer: Self-pay | Admitting: Physician Assistant

## 2019-07-16 ENCOUNTER — Other Ambulatory Visit: Payer: Self-pay

## 2019-07-16 ENCOUNTER — Ambulatory Visit (INDEPENDENT_AMBULATORY_CARE_PROVIDER_SITE_OTHER): Payer: Medicare Other | Admitting: Physician Assistant

## 2019-07-16 DIAGNOSIS — E7849 Other hyperlipidemia: Secondary | ICD-10-CM | POA: Diagnosis not present

## 2019-07-16 DIAGNOSIS — Z6838 Body mass index (BMI) 38.0-38.9, adult: Secondary | ICD-10-CM

## 2019-07-17 NOTE — Progress Notes (Signed)
Office: [phone]  /  Fax: [phone] TeleHealth Visit:  Angel French has verbally consented to this TeleHealth visit today. The patient is located at home, the provider is located at the [facility] office. The participants in this visit include the listed provider and patient and any and all parties involved. The visit was conducted today via FaceTime.  HPI:   Chief Complaint: OBESITY Angel French is here to discuss her progress with her obesity treatment plan. She is on the keep a food journal with 1200 to 1300 calories and 85 grams of protein daily plan and is following her eating plan approximately 70 % of the time. She states she is exercising 0 minutes 0 times per week. Angel French's most recent weight is 203 pounds (07/16/19). She has not been getting all of her protein in. We were unable to weigh the patient today for this TeleHealth visit. She feels as if she has gained weight since her last visit. She has lost 17 lbs since starting treatment with [name].  Hyperlipidemia Angel French has hyperlipidemia and she is on Crestor currently. She has been trying to improve her cholesterol levels with intensive lifestyle modification including a low saturated fat diet, exercise and weight loss. She denies any chest pain.  ASSESSMENT AND PLAN:  Other hyperlipidemia  Class 2 severe obesity with serious comorbidity and body mass index (BMI) of 38.0 to 38.9 in adult, unspecified obesity type (Angel French)  PLAN:  Hyperlipidemia Angel French was informed of the American Heart Association Guidelines emphasizing intensive lifestyle modifications as the first line treatment for hyperlipidemia. We discussed many lifestyle modifications today in depth, and Angel French will continue to work on decreasing saturated fats such as fatty red meat, butter and many fried foods. She will also increase vegetables and lean protein in her diet and continue to work on exercise and weight loss efforts. Angel French will continue  Crestor and follow up as directed.  Obesity Angel French is currently in the action stage of change. As such, her goal is to continue with weight loss efforts She has agreed to keep a food journal with 1200 to 1300 calories and 85 grams of protein daily Angel French has been instructed to work up to a goal of 150 minutes of combined cardio and strengthening exercise per week for weight loss and overall health benefits. We discussed the following Behavioral Modification Strategies today: keeping healthy foods in the home and work on meal planning and easy cooking plans  Angel French has agreed to follow up with our clinic in 2 weeks. She was informed of the importance of frequent follow up visits to maximize her success with intensive lifestyle modifications for her multiple health conditions.  I spent > than 50% of the 25 minute visit on counseling as documented in the note.    ALLERGIES: Allergies  Allergen Reactions   Codeine Anaphylaxis   Contrast Media [Iodinated Diagnostic Agents] Anaphylaxis   Nitrofurantoin Monohyd Macro Anaphylaxis   Betadine [Povidone Iodine] Itching   Folic Acid Itching   Gabapentin Other (See Comments)    Makes patient feel drunk   Iodine Hives   Lyrica [Pregabalin] Other (See Comments)    Makes patient feel drunk   Red Dye Itching   Ultram [Tramadol Hcl] Nausea And Vomiting    MEDICATIONS: Current Outpatient Medications on File Prior to Visit  Medication Sig Dispense Refill   acetaminophen (TYLENOL) 325 MG tablet Take 650 mg by mouth every 6 (six) hours as needed (every 6 weeks before RA infusion).  allopurinol (ZYLOPRIM) 100 MG tablet Take 1 tablet (100 mg total) by mouth daily. Ov needed (Patient taking differently: Take 200 mg by mouth daily. ) 90 tablet 3   aspirin 325 MG tablet Take 325 mg by mouth daily.      Blood Glucose Monitoring Suppl (BLOOD GLUCOSE METER KIT AND SUPPLIES) KIT Dispense based on patient and insurance preference. Use up to  four times daily as directed. (FOR ICD-9 250.00, 250.01). 1 each 11   Cholecalciferol (VITAMIN D3) 5000 units CAPS Take 1 capsule by mouth daily.     diclofenac sodium (VOLTAREN) 1 % GEL Apply 2 g topically 4 (four) times daily. (Patient taking differently: Apply 2 g topically 2 (two) times daily as needed (pain). ) 100 g 3   diphenhydrAMINE (BENADRYL) 25 MG tablet Take 25 mg by mouth every 6 (six) hours as needed (every 6 weeks prior to RA infusion).     DULoxetine (CYMBALTA) 30 MG capsule TAKE 1 CAPSULE BY MOUTH EVERY DAY 90 capsule 3   escitalopram (LEXAPRO) 20 MG tablet TAKE 1 TABLET BY MOUTH EVERY DAY 90 tablet 1   furosemide (LASIX) 20 MG tablet Take 1-3 tablets (20-60 mg total) by mouth daily. 200 tablet 1   glucose blood test strip Check sugar three times daily  Dx: DMII insulin dependent with retinopathy, neuropathy controlled 300 each 3   HYDROcodone-acetaminophen (NORCO) 10-325 MG tablet Take 1 tablet by mouth every 12 (twelve) hours as needed. (Patient taking differently: Take 1 tablet by mouth every 8 (eight) hours as needed. ) 60 tablet 0   insulin aspart (NOVOLOG) 100 UNIT/ML injection Inject 25 Units into the skin 3 (three) times daily with meals. 10 mL 11   Insulin Syringes, Disposable, U-100 0.5 ML MISC 28 Units by Does not apply route 2 (two) times daily. 100 each 11   leflunomide (ARAVA) 20 MG tablet Take 20 mg by mouth daily.      LEVEMIR 100 UNIT/ML injection INJECT 60 UNITS AT BEDTIME AS DIRECTED (Patient taking differently: Inject 90 Units into the skin daily. ) 20 mL 1   linaclotide (LINZESS) 290 MCG CAPS capsule Take 290 mcg by mouth daily before breakfast.     Needles & Syringes MISC 1 Syringe by Does not apply route 2 (two) times daily. 100 each 11   omeprazole (PRILOSEC) 20 MG capsule TAKE 1 CAPSULE BY MOUTH EVERY DAY 90 capsule 3   oxybutynin (DITROPAN XL) 15 MG 24 hr tablet TAKE 1 TABLET BY MOUTH AT BEDTIME 90 tablet 3   predniSONE (DELTASONE) 5 MG  tablet Take 5 mg by mouth daily with breakfast. Only takes with RA Flare.     rosuvastatin (CRESTOR) 10 MG tablet Take 1 tablet (10 mg total) by mouth daily. 90 tablet 1   traZODone (DESYREL) 100 MG tablet TAKE 2 TABLETS BY MOUTH EVERY DAY AT BEDTIME 180 tablet 1   ULTICARE INSULIN SYRINGE 31G X 5/16" 0.5 ML MISC USE AS DIRECTED TO INJECT INSULIN 2 TIMES DAILY 100 each 4   No current facility-administered medications on file prior to visit.     PAST MEDICAL HISTORY: Past Medical History:  Diagnosis Date   Allergy    generic allergy pill; Spring and Fall only.   Anxiety    Arthritis    DDD lumbar, R hip OA.  s/p ortho consult in past.   Blood transfusion without reported diagnosis    Mountain climbing accident in [place].   Brachial plexus disorders    Cataract  B retractions.   Chronic kidney disease    stage 3 per pt.    Chronic pain syndrome    Chronic renal insufficiency, stage 3 (moderate) (HCC)    Constipation    DDD (degenerative disc disease), lumbar    Depression    Diabetes mellitus    Diabetic peripheral neuropathy associated with type 2 diabetes mellitus (HCC)    Diabetic retinopathy ([place])    Diabetic retinopathy associated with type 2 diabetes mellitus ([place])    s/p laser treatment multiple.  Unable to drive.   Fatty liver    Fibromyalgia    Food allergy    GERD (gastroesophageal reflux disease)    Hypercholesteremia    Hyperlipidemia    Hypertension    controlled, off meds    IBS (irritable bowel syndrome)    Leg edema    Neuromuscular disorder (HCC)    OSA (obstructive sleep apnea)    Osteoarthritis    Rheumatic fever    Rheumatoid arthritis (HCC)    Stomach ulcer    Swallowing difficulty    TIA (transient ischemic attack)    Ulcer    Peptic ulcer H. Pylori + s/p treatment.  Upper GI diagnosed.[name] Prilosec PRN .    PAST SURGICAL HISTORY: Past Surgical History:  Procedure Laterality Date    2 SPINAL  INJECTIONS      ABDOMINAL HYSTERECTOMY  11/02/1979   DUB; cervical dysplasia; ovaries intact.   ABDOMINAL SURGERY     staph abcess    [facility] Admission     age 63; three months in [place].   BREAST BIOPSY     CARDIAC CATHETERIZATION  11/02/2007   normal coronary arteries.   CARPAL TUNNEL RELEASE     Bilateral.   CATARACT EXTRACTION, BILATERAL     CHOLECYSTECTOMY     ESOPHAGEAL MANOMETRY N/A 09/14/2017   Procedure: ESOPHAGEAL MANOMETRY (EM);  Surgeon: Ronnette Juniper, MD;  Location: [facility];  Service: Gastroenterology;  Laterality: N/A;   EYE SURGERY     Cataracts B. Laser surgery x 7 for Diabetic Retinopathy   TONSILLECTOMY      SOCIAL HISTORY: Social History   Tobacco Use   Smoking status: Former Smoker   Smokeless tobacco: Never Used   Tobacco comment: Quit 1987  Substance Use Topics   Alcohol use: No    Alcohol/week: 0.0 standard drinks   Drug use: No    FAMILY HISTORY: Family History  Adopted: Yes  Family history unknown: Yes    ROS: Review of Systems  Constitutional: Negative for weight loss.  Cardiovascular: Negative for chest pain.    PHYSICAL EXAM: Pt in no acute distress  RECENT LABS AND TESTS: BMET    Component Value Date/Time   NA 143 01/02/2019 1258   K 3.9 01/02/2019 1258   CL 100 01/02/2019 1258   CO2 24 01/02/2019 1258   GLUCOSE 38 (LL) 01/02/2019 1258   GLUCOSE 58 (L) 09/08/2016 1035   BUN 31 (H) 01/02/2019 1258   CREATININE 1.43 (H) 01/02/2019 1258   CREATININE 1.30 (H) 09/08/2016 1035   CALCIUM 9.9 01/02/2019 1258   GFRNONAA 38 (L) 01/02/2019 1258   GFRNONAA 36 (L) 07/10/2014 1354   GFRAA 44 (L) 01/02/2019 1258   GFRAA 41 (L) 07/10/2014 1354   Lab Results  Component Value Date   HGBA1C 5.1 01/02/2019   HGBA1C 6.0 (H) 08/03/2018   HGBA1C 8.1 (H) 03/07/2018   HGBA1C 11.0 01/09/2018   HGBA1C 8.0 10/07/2017   Lab Results  Component Value Date   INSULIN 6.2 03/07/2018   CBC    Component Value  Date/Time   WBC 5.1 01/30/2018 1000   WBC 5.7 09/08/2016 1035   RBC 3.38 (L) 01/30/2018 1000   RBC 3.82 09/08/2016 1035   HGB 10.9 (L) 01/30/2018 1000   HCT 30.8 (L) 01/30/2018 1000   PLT 226 01/30/2018 1000   MCV 91 01/30/2018 1000   MCH 32.2 01/30/2018 1000   MCH 29.8 09/08/2016 1035   MCHC 35.4 01/30/2018 1000   MCHC 32.5 09/08/2016 1035   RDW 14.8 01/30/2018 1000   LYMPHSABS 1.7 01/30/2018 1000   MONOABS 513 09/08/2016 1035   EOSABS 0.3 01/30/2018 1000   BASOSABS 0.0 01/30/2018 1000   Iron/TIBC/Ferritin/ %Sat    Component Value Date/Time   IRON 79 02/03/2016 1015   TIBC 309 02/03/2016 1015   IRONPCTSAT 26 02/03/2016 1015   Lipid Panel     Component Value Date/Time   CHOL 162 01/02/2019 1258   TRIG 225 (H) 01/02/2019 1258   HDL 37 (L) 01/02/2019 1258   CHOLHDL 3.9 08/03/2018 1209   CHOLHDL 5.3 (H) 09/08/2016 1035   VLDL 46 (H) 09/08/2016 1035   LDLCALC 80 01/02/2019 1258   Hepatic Function Panel     Component Value Date/Time   PROT 7.6 01/02/2019 1258   ALBUMIN 4.3 01/02/2019 1258   AST 23 01/02/2019 1258   ALT 16 01/02/2019 1258   ALKPHOS 108 01/02/2019 1258   BILITOT 0.3 01/02/2019 1258      Component Value Date/Time   TSH 0.958 03/07/2018 1310   TSH 0.874 01/30/2018 1000   TSH 1.060 03/23/2017 1230     Ref. Range 01/02/2019 12:58  Vitamin D, 25-Hydroxy Latest Ref Range: 30.0 - 100.0 ng/mL 64.9    I, Doreene Nest, am acting as Location manager for Abby Potash, PA-C I, Abby Potash, PA-C have reviewed above note and agree with its content

## 2019-07-30 ENCOUNTER — Telehealth (INDEPENDENT_AMBULATORY_CARE_PROVIDER_SITE_OTHER): Payer: Medicare Other | Admitting: Physician Assistant

## 2019-07-30 ENCOUNTER — Other Ambulatory Visit: Payer: Self-pay

## 2019-07-30 DIAGNOSIS — Z794 Long term (current) use of insulin: Secondary | ICD-10-CM | POA: Diagnosis not present

## 2019-07-30 DIAGNOSIS — E1140 Type 2 diabetes mellitus with diabetic neuropathy, unspecified: Secondary | ICD-10-CM | POA: Diagnosis not present

## 2019-07-30 DIAGNOSIS — Z6838 Body mass index (BMI) 38.0-38.9, adult: Secondary | ICD-10-CM

## 2019-07-31 NOTE — Progress Notes (Signed)
Office: [phone]  /  Fax: [phone] TeleHealth Visit:  Angel French has verbally consented to this TeleHealth visit today. The patient is located at home, the provider is located at the [facility] office. The participants in this visit include the listed provider and patient. The visit was conducted today via Facetime.  HPI:   Chief Complaint: OBESITY Angel French is here to discuss her progress with her obesity treatment plan. She is on the  keep a food journal with 1200-1300 calories and 85g of pro protein  daily and is following her eating plan approximately 80 % of the time. She states she is exercising 0 minutes 0 times per week. Angel French most recent weight is 200 lbs at home. She has been eating more protein by eating lactose free yogurt.  We were unable to weigh the patient today for this TeleHealth visit. She feels as if she has lost weight since her last visit. She has lost 17 lbs since starting treatment with [name].  Diabetes II Angel French has a diagnosis of diabetes type II. Angel French states fasting BGs on average around 115. She is currently on insulin and admits to one episode hypoglycemic episodes at 70 where she did not eat. Her PCP lowered her insulin. Last A1c was 5.1.  She has been working on intensive lifestyle modifications including diet, exercise, and weight loss to help control her blood glucose levels.   ASSESSMENT AND PLAN:  Type 2 diabetes mellitus with diabetic neuropathy, with long-term current use of insulin (HCC)  Class 2 severe obesity with serious comorbidity and body mass index (BMI) of 38.0 to 38.9 in adult, unspecified obesity type ([place])  PLAN: Diabetes II Angel French has been given extensive diabetes education by myself today including ideal fasting and post-prandial blood glucose readings, individual ideal HgA1c goals  and hypoglycemia prevention. We discussed the importance of good blood sugar control to decrease the likelihood of diabetic  complications such as nephropathy, neuropathy, limb loss, blindness, coronary artery disease, and death. We discussed the importance of intensive lifestyle modification including diet, exercise and weight loss as the first line treatment for diabetes. Angel French agrees to continue her diabetes medications and will follow up at the agreed upon time.  I spent > than 50% of the 25 minute visit on counseling as documented in the note.  Obesity Angel French is currently in the action stage of change. As such, her goal is to continue with weight loss efforts She has agreed to keep a food journal with 1200-1300 calories and 85g of  protein daily.  Angel French has been instructed to work up to a goal of 150 minutes of combined cardio and strengthening exercise per week for weight loss and overall health benefits. We discussed the following Behavioral Modification Stratagies today: work on meal planning and easy cooking plans and keeping healthy foods in the home.   Angel French has agreed to follow up with our clinic in 2 weeks. She was informed of the importance of frequent follow up visits to maximize her success with intensive lifestyle modifications for her multiple health conditions.  ALLERGIES: Allergies  Allergen Reactions   Codeine Anaphylaxis   Contrast Media [Iodinated Diagnostic Agents] Anaphylaxis   Nitrofurantoin Monohyd Macro Anaphylaxis   Betadine [Povidone Iodine] Itching   Folic Acid Itching   Gabapentin Other (See Comments)    Makes patient feel drunk   Iodine Hives   Lyrica [Pregabalin] Other (See Comments)    Makes patient feel drunk   Red Dye Itching  Ultram [Tramadol Hcl] Nausea And Vomiting    MEDICATIONS: Current Outpatient Medications on File Prior to Visit  Medication Sig Dispense Refill   acetaminophen (TYLENOL) 325 MG tablet Take 650 mg by mouth every 6 (six) hours as needed (every 6 weeks before RA infusion).     allopurinol (ZYLOPRIM) 100 MG tablet Take 1 tablet (100  mg total) by mouth daily. Ov needed (Patient taking differently: Take 200 mg by mouth daily. ) 90 tablet 3   aspirin 325 MG tablet Take 325 mg by mouth daily.      Blood Glucose Monitoring Suppl (BLOOD GLUCOSE METER KIT AND SUPPLIES) KIT Dispense based on patient and insurance preference. Use up to four times daily as directed. (FOR ICD-9 250.00, 250.01). 1 each 11   Cholecalciferol (VITAMIN D3) 5000 units CAPS Take 1 capsule by mouth daily.     diclofenac sodium (VOLTAREN) 1 % GEL Apply 2 g topically 4 (four) times daily. (Patient taking differently: Apply 2 g topically 2 (two) times daily as needed (pain). ) 100 g 3   diphenhydrAMINE (BENADRYL) 25 MG tablet Take 25 mg by mouth every 6 (six) hours as needed (every 6 weeks prior to RA infusion).     DULoxetine (CYMBALTA) 30 MG capsule TAKE 1 CAPSULE BY MOUTH EVERY DAY 90 capsule 3   escitalopram (LEXAPRO) 20 MG tablet TAKE 1 TABLET BY MOUTH EVERY DAY 90 tablet 1   furosemide (LASIX) 20 MG tablet Take 1-3 tablets (20-60 mg total) by mouth daily. 200 tablet 1   glucose blood test strip Check sugar three times daily  Dx: DMII insulin dependent with retinopathy, neuropathy controlled 300 each 3   HYDROcodone-acetaminophen (NORCO) 10-325 MG tablet Take 1 tablet by mouth every 12 (twelve) hours as needed. (Patient taking differently: Take 1 tablet by mouth every 8 (eight) hours as needed. ) 60 tablet 0   insulin aspart (NOVOLOG) 100 UNIT/ML injection Inject 25 Units into the skin 3 (three) times daily with meals. 10 mL 11   Insulin Syringes, Disposable, U-100 0.5 ML MISC 28 Units by Does not apply route 2 (two) times daily. 100 each 11   leflunomide (ARAVA) 20 MG tablet Take 20 mg by mouth daily.      LEVEMIR 100 UNIT/ML injection INJECT 60 UNITS AT BEDTIME AS DIRECTED (Patient taking differently: Inject 90 Units into the skin daily. ) 20 mL 1   linaclotide (LINZESS) 290 MCG CAPS capsule Take 290 mcg by mouth daily before breakfast.      Needles & Syringes MISC 1 Syringe by Does not apply route 2 (two) times daily. 100 each 11   omeprazole (PRILOSEC) 20 MG capsule TAKE 1 CAPSULE BY MOUTH EVERY DAY 90 capsule 3   oxybutynin (DITROPAN XL) 15 MG 24 hr tablet TAKE 1 TABLET BY MOUTH AT BEDTIME 90 tablet 3   predniSONE (DELTASONE) 5 MG tablet Take 5 mg by mouth daily with breakfast. Only takes with RA Flare.     rosuvastatin (CRESTOR) 10 MG tablet Take 1 tablet (10 mg total) by mouth daily. 90 tablet 1   traZODone (DESYREL) 100 MG tablet TAKE 2 TABLETS BY MOUTH EVERY DAY AT BEDTIME 180 tablet 1   ULTICARE INSULIN SYRINGE 31G X 5/16" 0.5 ML MISC USE AS DIRECTED TO INJECT INSULIN 2 TIMES DAILY 100 each 4   No current facility-administered medications on file prior to visit.     PAST MEDICAL HISTORY: Past Medical History:  Diagnosis Date   Allergy    generic allergy  pill; Spring and Fall only.   Anxiety    Arthritis    DDD lumbar, R hip OA.  s/p ortho consult in past.   Blood transfusion without reported diagnosis    Mountain climbing accident in [place].   Brachial plexus disorders    Cataract    B retractions.   Chronic kidney disease    stage 3 per pt.    Chronic pain syndrome    Chronic renal insufficiency, stage 3 (moderate) (HCC)    Constipation    DDD (degenerative disc disease), lumbar    Depression    Diabetes mellitus    Diabetic peripheral neuropathy associated with type 2 diabetes mellitus (HCC)    Diabetic retinopathy ([place])    Diabetic retinopathy associated with type 2 diabetes mellitus ([place])    s/p laser treatment multiple.  Unable to drive.   Fatty liver    Fibromyalgia    Food allergy    GERD (gastroesophageal reflux disease)    Hypercholesteremia    Hyperlipidemia    Hypertension    controlled, off meds    IBS (irritable bowel syndrome)    Leg edema    Neuromuscular disorder (HCC)    OSA (obstructive sleep apnea)    Osteoarthritis    Rheumatic fever     Rheumatoid arthritis (HCC)    Stomach ulcer    Swallowing difficulty    TIA (transient ischemic attack)    Ulcer    Peptic ulcer H. Pylori + s/p treatment.  Upper GI diagnosed.[name] Prilosec PRN .    PAST SURGICAL HISTORY: Past Surgical History:  Procedure Laterality Date    2 SPINAL INJECTIONS      ABDOMINAL HYSTERECTOMY  11/02/1979   DUB; cervical dysplasia; ovaries intact.   ABDOMINAL SURGERY     staph abcess    [facility] Admission     age 72; three months in [place].   BREAST BIOPSY     CARDIAC CATHETERIZATION  11/02/2007   normal coronary arteries.   CARPAL TUNNEL RELEASE     Bilateral.   CATARACT EXTRACTION, BILATERAL     CHOLECYSTECTOMY     ESOPHAGEAL MANOMETRY N/A 09/14/2017   Procedure: ESOPHAGEAL MANOMETRY (EM);  Surgeon: Ronnette Juniper, MD;  Location: [facility];  Service: Gastroenterology;  Laterality: N/A;   EYE SURGERY     Cataracts B. Laser surgery x 7 for Diabetic Retinopathy   TONSILLECTOMY      SOCIAL HISTORY: Social History   Tobacco Use   Smoking status: Former Smoker   Smokeless tobacco: Never Used   Tobacco comment: Quit 1987  Substance Use Topics   Alcohol use: No    Alcohol/week: 0.0 standard drinks   Drug use: No    FAMILY HISTORY: Family History  Adopted: Yes  Family history unknown: Yes    ROS: Review of Systems  Endo/Heme/Allergies:       Negative for hypoglycemia     PHYSICAL EXAM: Pt in no acute distress  RECENT LABS AND TESTS: BMET    Component Value Date/Time   NA 143 01/02/2019 1258   K 3.9 01/02/2019 1258   CL 100 01/02/2019 1258   CO2 24 01/02/2019 1258   GLUCOSE 38 (LL) 01/02/2019 1258   GLUCOSE 58 (L) 09/08/2016 1035   BUN 31 (H) 01/02/2019 1258   CREATININE 1.43 (H) 01/02/2019 1258   CREATININE 1.30 (H) 09/08/2016 1035   CALCIUM 9.9 01/02/2019 1258   GFRNONAA 38 (L) 01/02/2019 1258   GFRNONAA 36 (L) 07/10/2014 1354  GFRAA 44 (L) 01/02/2019 1258   GFRAA 41 (L) 07/10/2014  1354   Lab Results  Component Value Date   HGBA1C 5.1 01/02/2019   HGBA1C 6.0 (H) 08/03/2018   HGBA1C 8.1 (H) 03/07/2018   HGBA1C 11.0 01/09/2018   HGBA1C 8.0 10/07/2017   Lab Results  Component Value Date   INSULIN 6.2 03/07/2018   CBC    Component Value Date/Time   WBC 5.1 01/30/2018 1000   WBC 5.7 09/08/2016 1035   RBC 3.38 (L) 01/30/2018 1000   RBC 3.82 09/08/2016 1035   HGB 10.9 (L) 01/30/2018 1000   HCT 30.8 (L) 01/30/2018 1000   PLT 226 01/30/2018 1000   MCV 91 01/30/2018 1000   MCH 32.2 01/30/2018 1000   MCH 29.8 09/08/2016 1035   MCHC 35.4 01/30/2018 1000   MCHC 32.5 09/08/2016 1035   RDW 14.8 01/30/2018 1000   LYMPHSABS 1.7 01/30/2018 1000   MONOABS 513 09/08/2016 1035   EOSABS 0.3 01/30/2018 1000   BASOSABS 0.0 01/30/2018 1000   Iron/TIBC/Ferritin/ %Sat    Component Value Date/Time   IRON 79 02/03/2016 1015   TIBC 309 02/03/2016 1015   IRONPCTSAT 26 02/03/2016 1015   Lipid Panel     Component Value Date/Time   CHOL 162 01/02/2019 1258   TRIG 225 (H) 01/02/2019 1258   HDL 37 (L) 01/02/2019 1258   CHOLHDL 3.9 08/03/2018 1209   CHOLHDL 5.3 (H) 09/08/2016 1035   VLDL 46 (H) 09/08/2016 1035   LDLCALC 80 01/02/2019 1258   Hepatic Function Panel     Component Value Date/Time   PROT 7.6 01/02/2019 1258   ALBUMIN 4.3 01/02/2019 1258   AST 23 01/02/2019 1258   ALT 16 01/02/2019 1258   ALKPHOS 108 01/02/2019 1258   BILITOT 0.3 01/02/2019 1258      Component Value Date/Time   TSH 0.958 03/07/2018 1310   TSH 0.874 01/30/2018 1000   TSH 1.060 03/23/2017 1230      I, Renee Ramus, am acting as Location manager for Abby Potash, PA-C  I, Abby Potash, PA-C have reviewed above note and agree with its content

## 2019-08-01 DIAGNOSIS — Z79899 Other long term (current) drug therapy: Secondary | ICD-10-CM | POA: Diagnosis not present

## 2019-08-01 DIAGNOSIS — M0609 Rheumatoid arthritis without rheumatoid factor, multiple sites: Secondary | ICD-10-CM | POA: Diagnosis not present

## 2019-08-06 DIAGNOSIS — N183 Chronic kidney disease, stage 3 unspecified: Secondary | ICD-10-CM | POA: Diagnosis not present

## 2019-08-06 DIAGNOSIS — E1122 Type 2 diabetes mellitus with diabetic chronic kidney disease: Secondary | ICD-10-CM | POA: Diagnosis not present

## 2019-08-06 DIAGNOSIS — I129 Hypertensive chronic kidney disease with stage 1 through stage 4 chronic kidney disease, or unspecified chronic kidney disease: Secondary | ICD-10-CM | POA: Diagnosis not present

## 2019-08-06 DIAGNOSIS — M0600 Rheumatoid arthritis without rheumatoid factor, unspecified site: Secondary | ICD-10-CM | POA: Diagnosis not present

## 2019-08-06 DIAGNOSIS — D631 Anemia in chronic kidney disease: Secondary | ICD-10-CM | POA: Diagnosis not present

## 2019-08-13 ENCOUNTER — Encounter (INDEPENDENT_AMBULATORY_CARE_PROVIDER_SITE_OTHER): Payer: Self-pay | Admitting: Physician Assistant

## 2019-08-13 ENCOUNTER — Ambulatory Visit (INDEPENDENT_AMBULATORY_CARE_PROVIDER_SITE_OTHER): Payer: Medicare Other | Admitting: Physician Assistant

## 2019-08-13 ENCOUNTER — Other Ambulatory Visit: Payer: Self-pay

## 2019-08-13 DIAGNOSIS — Z6837 Body mass index (BMI) 37.0-37.9, adult: Secondary | ICD-10-CM

## 2019-08-13 DIAGNOSIS — E119 Type 2 diabetes mellitus without complications: Secondary | ICD-10-CM

## 2019-08-14 NOTE — Progress Notes (Signed)
Office: [phone]  /  Fax: [phone] TeleHealth Visit:  Angel French has verbally consented to this TeleHealth visit today. The patient is located at home, the provider is located at the [facility] office. The participants in this visit include the listed provider and patient and any and all parties involved. The visit was conducted today via FaceTime.  HPI:   Chief Complaint: OBESITY Angel French is here to discuss her progress with her obesity treatment plan. She is on the keep a food journal with 1200 to 1300 calories and 55 grams of protein daily plan and is following her eating plan approximately 75 % of the time. She states she is exercising 0 minutes 0 times per week. Angel French reports that she has been in a lot of pain and she started steroids, which makes her hungrier. She is not eating enough protein. We were unable to weigh the patient today for this TeleHealth visit. She feels as if she has gained weight since her last visit (weight 203 lbs 08/13/19). She has lost 17 lbs since starting treatment with [name].  Diabetes II Angel French has a diagnosis of diabetes type II. She is on insulin. Angel French's blood sugar is elevated, due to taking steroids. Her fasting BGs average in the 120's and she denies any hypoglycemic episodes. She has been working on intensive lifestyle modifications including diet, exercise, and weight loss to help control her blood glucose levels.  ASSESSMENT AND PLAN:  Type 2 diabetes mellitus without complication, without long-term current use of insulin (HCC)  Class 2 severe obesity with serious comorbidity and body mass index (BMI) of 37.0 to 37.9 in adult, unspecified obesity type ([place])  PLAN:  Diabetes II Angel French has been given extensive diabetes education by myself today including ideal fasting and post-prandial blood glucose readings, individual ideal Hgb A1c goals and hypoglycemia prevention. We discussed the importance of good blood sugar control  to decrease the likelihood of diabetic complications such as nephropathy, neuropathy, limb loss, blindness, coronary artery disease, and death. We discussed the importance of intensive lifestyle modification including diet, exercise and weight loss as the first line treatment for diabetes. Angel French will continue her diabetes medications and weight loss and she will follow up at the agreed upon time.  Obesity Angel French is currently in the action stage of change. As such, her goal is to continue with weight loss efforts She has agreed to keep a food journal with 1200 to 1300 calories and 85 grams of protein daily Angel French has been instructed to work up to a goal of 150 minutes of combined cardio and strengthening exercise per week for weight loss and overall health benefits. We discussed the following Behavioral Modification Strategies today: keeping healthy foods in the home and work on meal planning and easy cooking plans  Angel French has agreed to follow up with our clinic in 2 weeks. She was informed of the importance of frequent follow up visits to maximize her success with intensive lifestyle modifications for her multiple health conditions.  I spent > than 50% of the 25 minute visit on counseling as documented in the note.    ALLERGIES: Allergies  Allergen Reactions   Codeine Anaphylaxis   Contrast Media [Iodinated Diagnostic Agents] Anaphylaxis   Nitrofurantoin Monohyd Macro Anaphylaxis   Betadine [Povidone Iodine] Itching   Folic Acid Itching   Gabapentin Other (See Comments)    Makes patient feel drunk   Iodine Hives   Lyrica [Pregabalin] Other (See Comments)    Makes patient  feel drunk   Red Dye Itching   Ultram [Tramadol Hcl] Nausea And Vomiting    MEDICATIONS: Current Outpatient Medications on File Prior to Visit  Medication Sig Dispense Refill   acetaminophen (TYLENOL) 325 MG tablet Take 650 mg by mouth every 6 (six) hours as needed (every 6 weeks before RA infusion).       allopurinol (ZYLOPRIM) 100 MG tablet Take 1 tablet (100 mg total) by mouth daily. Ov needed (Patient taking differently: Take 200 mg by mouth daily. ) 90 tablet 3   aspirin 325 MG tablet Take 325 mg by mouth daily.      Blood Glucose Monitoring Suppl (BLOOD GLUCOSE METER KIT AND SUPPLIES) KIT Dispense based on patient and insurance preference. Use up to four times daily as directed. (FOR ICD-9 250.00, 250.01). 1 each 11   Cholecalciferol (VITAMIN D3) 5000 units CAPS Take 1 capsule by mouth daily.     diclofenac sodium (VOLTAREN) 1 % GEL Apply 2 g topically 4 (four) times daily. (Patient taking differently: Apply 2 g topically 2 (two) times daily as needed (pain). ) 100 g 3   diphenhydrAMINE (BENADRYL) 25 MG tablet Take 25 mg by mouth every 6 (six) hours as needed (every 6 weeks prior to RA infusion).     DULoxetine (CYMBALTA) 30 MG capsule TAKE 1 CAPSULE BY MOUTH EVERY DAY 90 capsule 3   escitalopram (LEXAPRO) 20 MG tablet TAKE 1 TABLET BY MOUTH EVERY DAY 90 tablet 1   furosemide (LASIX) 20 MG tablet Take 1-3 tablets (20-60 mg total) by mouth daily. 200 tablet 1   glucose blood test strip Check sugar three times daily  Dx: DMII insulin dependent with retinopathy, neuropathy controlled 300 each 3   HYDROcodone-acetaminophen (NORCO) 10-325 MG tablet Take 1 tablet by mouth every 12 (twelve) hours as needed. (Patient taking differently: Take 1 tablet by mouth every 8 (eight) hours as needed. ) 60 tablet 0   insulin aspart (NOVOLOG) 100 UNIT/ML injection Inject 25 Units into the skin 3 (three) times daily with meals. 10 mL 11   Insulin Syringes, Disposable, U-100 0.5 ML MISC 28 Units by Does not apply route 2 (two) times daily. 100 each 11   leflunomide (ARAVA) 20 MG tablet Take 20 mg by mouth daily.      LEVEMIR 100 UNIT/ML injection INJECT 60 UNITS AT BEDTIME AS DIRECTED (Patient taking differently: Inject 90 Units into the skin daily. ) 20 mL 1   linaclotide (LINZESS) 290 MCG CAPS  capsule Take 290 mcg by mouth daily before breakfast.     Needles & Syringes MISC 1 Syringe by Does not apply route 2 (two) times daily. 100 each 11   omeprazole (PRILOSEC) 20 MG capsule TAKE 1 CAPSULE BY MOUTH EVERY DAY 90 capsule 3   oxybutynin (DITROPAN XL) 15 MG 24 hr tablet TAKE 1 TABLET BY MOUTH AT BEDTIME 90 tablet 3   predniSONE (DELTASONE) 5 MG tablet Take 5 mg by mouth daily with breakfast. Only takes with RA Flare.     rosuvastatin (CRESTOR) 10 MG tablet Take 1 tablet (10 mg total) by mouth daily. 90 tablet 1   traZODone (DESYREL) 100 MG tablet TAKE 2 TABLETS BY MOUTH EVERY DAY AT BEDTIME 180 tablet 1   ULTICARE INSULIN SYRINGE 31G X 5/16" 0.5 ML MISC USE AS DIRECTED TO INJECT INSULIN 2 TIMES DAILY 100 each 4   No current facility-administered medications on file prior to visit.     PAST MEDICAL HISTORY: Past Medical History:  Diagnosis Date   Allergy    generic allergy pill; Spring and Fall only.   Anxiety    Arthritis    DDD lumbar, R hip OA.  s/p ortho consult in past.   Blood transfusion without reported diagnosis    Mountain climbing accident in [place].   Brachial plexus disorders    Cataract    B retractions.   Chronic kidney disease    stage 3 per pt.    Chronic pain syndrome    Chronic renal insufficiency, stage 3 (moderate)    Constipation    DDD (degenerative disc disease), lumbar    Depression    Diabetes mellitus    Diabetic peripheral neuropathy associated with type 2 diabetes mellitus (HCC)    Diabetic retinopathy ([place])    Diabetic retinopathy associated with type 2 diabetes mellitus ([place])    s/p laser treatment multiple.  Unable to drive.   Fatty liver    Fibromyalgia    Food allergy    GERD (gastroesophageal reflux disease)    Hypercholesteremia    Hyperlipidemia    Hypertension    controlled, off meds    IBS (irritable bowel syndrome)    Leg edema    Neuromuscular disorder (HCC)    OSA (obstructive sleep  apnea)    Osteoarthritis    Rheumatic fever    Rheumatoid arthritis (HCC)    Stomach ulcer    Swallowing difficulty    TIA (transient ischemic attack)    Ulcer    Peptic ulcer H. Pylori + s/p treatment.  Upper GI diagnosed.Angel French Prilosec PRN .    PAST SURGICAL HISTORY: Past Surgical History:  Procedure Laterality Date    2 SPINAL INJECTIONS      ABDOMINAL HYSTERECTOMY  11/02/1979   DUB; cervical dysplasia; ovaries intact.   ABDOMINAL SURGERY     staph abcess    [facility] Admission     age 25; three months in [place].   BREAST BIOPSY     CARDIAC CATHETERIZATION  11/02/2007   normal coronary arteries.   CARPAL TUNNEL RELEASE     Bilateral.   CATARACT EXTRACTION, BILATERAL     CHOLECYSTECTOMY     ESOPHAGEAL MANOMETRY N/A 09/14/2017   Procedure: ESOPHAGEAL MANOMETRY (EM);  Surgeon: Ronnette Juniper, MD;  Location: [facility];  Service: Gastroenterology;  Laterality: N/A;   EYE SURGERY     Cataracts B. Laser surgery x 7 for Diabetic Retinopathy   TONSILLECTOMY      SOCIAL HISTORY: Social History   Tobacco Use   Smoking status: Former Smoker   Smokeless tobacco: Never Used   Tobacco comment: Quit 1987  Substance Use Topics   Alcohol use: No    Alcohol/week: 0.0 standard drinks   Drug use: No    FAMILY HISTORY: Family History  Adopted: Yes  Family history unknown: Yes    ROS: Review of Systems  Constitutional: Negative for weight loss.  Endo/Heme/Allergies:       Negative for hypoglycemia    PHYSICAL EXAM: Pt in no acute distress  RECENT LABS AND TESTS: BMET    Component Value Date/Time   NA 143 01/02/2019 1258   K 3.9 01/02/2019 1258   CL 100 01/02/2019 1258   CO2 24 01/02/2019 1258   GLUCOSE 38 (LL) 01/02/2019 1258   GLUCOSE 58 (L) 09/08/2016 1035   BUN 31 (H) 01/02/2019 1258   CREATININE 1.43 (H) 01/02/2019 1258   CREATININE 1.30 (H) 09/08/2016 1035   CALCIUM 9.9 01/02/2019 1258  GFRNONAA 38 (L) 01/02/2019 1258    GFRNONAA 36 (L) 07/10/2014 1354   GFRAA 44 (L) 01/02/2019 1258   GFRAA 41 (L) 07/10/2014 1354   Lab Results  Component Value Date   HGBA1C 5.1 01/02/2019   HGBA1C 6.0 (H) 08/03/2018   HGBA1C 8.1 (H) 03/07/2018   HGBA1C 11.0 01/09/2018   HGBA1C 8.0 10/07/2017   Lab Results  Component Value Date   INSULIN 6.2 03/07/2018   CBC    Component Value Date/Time   WBC 5.1 01/30/2018 1000   WBC 5.7 09/08/2016 1035   RBC 3.38 (L) 01/30/2018 1000   RBC 3.82 09/08/2016 1035   HGB 10.9 (L) 01/30/2018 1000   HCT 30.8 (L) 01/30/2018 1000   PLT 226 01/30/2018 1000   MCV 91 01/30/2018 1000   MCH 32.2 01/30/2018 1000   MCH 29.8 09/08/2016 1035   MCHC 35.4 01/30/2018 1000   MCHC 32.5 09/08/2016 1035   RDW 14.8 01/30/2018 1000   LYMPHSABS 1.7 01/30/2018 1000   MONOABS 513 09/08/2016 1035   EOSABS 0.3 01/30/2018 1000   BASOSABS 0.0 01/30/2018 1000   Iron/TIBC/Ferritin/ %Sat    Component Value Date/Time   IRON 79 02/03/2016 1015   TIBC 309 02/03/2016 1015   IRONPCTSAT 26 02/03/2016 1015   Lipid Panel     Component Value Date/Time   CHOL 162 01/02/2019 1258   TRIG 225 (H) 01/02/2019 1258   HDL 37 (L) 01/02/2019 1258   CHOLHDL 3.9 08/03/2018 1209   CHOLHDL 5.3 (H) 09/08/2016 1035   VLDL 46 (H) 09/08/2016 1035   LDLCALC 80 01/02/2019 1258   Hepatic Function Panel     Component Value Date/Time   PROT 7.6 01/02/2019 1258   ALBUMIN 4.3 01/02/2019 1258   AST 23 01/02/2019 1258   ALT 16 01/02/2019 1258   ALKPHOS 108 01/02/2019 1258   BILITOT 0.3 01/02/2019 1258      Component Value Date/Time   TSH 0.958 03/07/2018 1310   TSH 0.874 01/30/2018 1000   TSH 1.060 03/23/2017 1230     Ref. Range 01/02/2019 12:58  Vitamin D, 25-Hydroxy Latest Ref Range: 30.0 - 100.0 ng/mL 64.9    I, Doreene Nest, am acting as Location manager for Abby Potash, PA-C I, Abby Potash, PA-C have reviewed above note and agree with its content

## 2019-08-27 ENCOUNTER — Encounter (INDEPENDENT_AMBULATORY_CARE_PROVIDER_SITE_OTHER): Payer: Self-pay | Admitting: Physician Assistant

## 2019-08-27 ENCOUNTER — Encounter (INDEPENDENT_AMBULATORY_CARE_PROVIDER_SITE_OTHER): Payer: Self-pay

## 2019-08-27 ENCOUNTER — Other Ambulatory Visit: Payer: Self-pay

## 2019-08-27 ENCOUNTER — Telehealth (INDEPENDENT_AMBULATORY_CARE_PROVIDER_SITE_OTHER): Payer: Medicare Other | Admitting: Physician Assistant

## 2019-08-27 DIAGNOSIS — E7849 Other hyperlipidemia: Secondary | ICD-10-CM | POA: Diagnosis not present

## 2019-08-27 DIAGNOSIS — Z6841 Body Mass Index (BMI) 40.0 and over, adult: Secondary | ICD-10-CM | POA: Diagnosis not present

## 2019-08-28 NOTE — Progress Notes (Signed)
Office: [phone]  /  Fax: [phone] TeleHealth Visit:  Angel French has verbally consented to this TeleHealth visit today. The patient is located at home, the provider is located at the [facility] office. The participants in this visit include the listed provider and patient. The visit was conducted today via face time.  HPI:   Chief Complaint: OBESITY Angel French is here to discuss her progress with her obesity treatment plan. She is on the keep a food journal with 1200-1300 calories and 85 grams of protein daily and is following her eating plan approximately 65 % of the time. She states she is exercising 0 minutes 0 times per week. Angel French reports being up 2 lbs. She is frustrated because she cannot follow the plan, due to not being able to get the groceries she needs. She also does not have a refrigerator on her floor in the house she is sharing. She reports a weight of 205 lbs and her blood sugar was 147 yesterday. We were unable to weigh the patient today for this TeleHealth visit. She feels as if she has gained 2 lbs since her last visit. She has lost 17 lbs since starting treatment with [name].  Hyperlipidemia Angel French has hyperlipidemia and has been trying to improve her cholesterol levels with intensive lifestyle modification including a low saturated fat diet, exercise and weight loss. She is on Crestor and denies any chest pain, claudication or myalgias.  ASSESSMENT AND PLAN:  Other hyperlipidemia  Class 3 severe obesity with serious comorbidity and body mass index (BMI) of 40.0 to 44.9 in adult, unspecified obesity type (Angel French)  PLAN:  Hyperlipidemia Angel French was informed of the American Heart Association Guidelines emphasizing intensive lifestyle modifications as the first line treatment for hyperlipidemia. We discussed many lifestyle modifications today in depth, and Angel French will continue to work on decreasing saturated fats such as fatty red meat, butter and many  fried foods. She will also increase vegetables and lean protein in her diet and continue to work on exercise and weight loss efforts. Angel French agrees to continue her medications and she agrees to follow up with our clinic in 2 weeks.  Obesity Angel French is currently in the action stage of change. As such, her goal is to continue with weight loss efforts She has agreed to follow our protein rich vegetarian plan Angel French has been instructed to work up to a goal of 150 minutes of combined cardio and strengthening exercise per week for weight loss and overall health benefits. We discussed the following Behavioral Modification Strategies today: work on meal planning and easy cooking plans and keeping healthy foods in the home   Angel French has agreed to follow up with our clinic in 2 weeks. She was informed of the importance of frequent follow up visits to maximize her success with intensive lifestyle modifications for her multiple health conditions.  ALLERGIES: Allergies  Allergen Reactions   Codeine Anaphylaxis   Contrast Media [Iodinated Diagnostic Agents] Anaphylaxis   Nitrofurantoin Monohyd Macro Anaphylaxis   Betadine [Povidone Iodine] Itching   Folic Acid Itching   Gabapentin Other (See Comments)    Makes patient feel drunk   Iodine Hives   Lyrica [Pregabalin] Other (See Comments)    Makes patient feel drunk   Red Dye Itching   Ultram [Tramadol Hcl] Nausea And Vomiting    MEDICATIONS: Current Outpatient Medications on File Prior to Visit  Medication Sig Dispense Refill   acetaminophen (TYLENOL) 325 MG tablet Take 650 mg by mouth every  6 (six) hours as needed (every 6 weeks before RA infusion).     allopurinol (ZYLOPRIM) 100 MG tablet Take 1 tablet (100 mg total) by mouth daily. Ov needed (Patient taking differently: Take 200 mg by mouth daily. ) 90 tablet 3   aspirin 325 MG tablet Take 325 mg by mouth daily.      Blood Glucose Monitoring Suppl (BLOOD GLUCOSE METER KIT AND  SUPPLIES) KIT Dispense based on patient and insurance preference. Use up to four times daily as directed. (FOR ICD-9 250.00, 250.01). 1 each 11   Cholecalciferol (VITAMIN D3) 5000 units CAPS Take 1 capsule by mouth daily.     diclofenac sodium (VOLTAREN) 1 % GEL Apply 2 g topically 4 (four) times daily. (Patient taking differently: Apply 2 g topically 2 (two) times daily as needed (pain). ) 100 g 3   diphenhydrAMINE (BENADRYL) 25 MG tablet Take 25 mg by mouth every 6 (six) hours as needed (every 6 weeks prior to RA infusion).     DULoxetine (CYMBALTA) 30 MG capsule TAKE 1 CAPSULE BY MOUTH EVERY DAY 90 capsule 3   escitalopram (LEXAPRO) 20 MG tablet TAKE 1 TABLET BY MOUTH EVERY DAY 90 tablet 1   furosemide (LASIX) 20 MG tablet Take 1-3 tablets (20-60 mg total) by mouth daily. 200 tablet 1   glucose blood test strip Check sugar three times daily  Dx: DMII insulin dependent with retinopathy, neuropathy controlled 300 each 3   HYDROcodone-acetaminophen (NORCO) 10-325 MG tablet Take 1 tablet by mouth every 12 (twelve) hours as needed. (Patient taking differently: Take 1 tablet by mouth every 8 (eight) hours as needed. ) 60 tablet 0   insulin aspart (NOVOLOG) 100 UNIT/ML injection Inject 25 Units into the skin 3 (three) times daily with meals. 10 mL 11   Insulin Syringes, Disposable, U-100 0.5 ML MISC 28 Units by Does not apply route 2 (two) times daily. 100 each 11   leflunomide (ARAVA) 20 MG tablet Take 20 mg by mouth daily.      LEVEMIR 100 UNIT/ML injection INJECT 60 UNITS AT BEDTIME AS DIRECTED (Patient taking differently: Inject 90 Units into the skin daily. ) 20 mL 1   linaclotide (LINZESS) 290 MCG CAPS capsule Take 290 mcg by mouth daily before breakfast.     Needles & Syringes MISC 1 Syringe by Does not apply route 2 (two) times daily. 100 each 11   omeprazole (PRILOSEC) 20 MG capsule TAKE 1 CAPSULE BY MOUTH EVERY DAY 90 capsule 3   oxybutynin (DITROPAN XL) 15 MG 24 hr tablet TAKE  1 TABLET BY MOUTH AT BEDTIME 90 tablet 3   predniSONE (DELTASONE) 5 MG tablet Take 5 mg by mouth daily with breakfast. Only takes with RA Flare.     rosuvastatin (CRESTOR) 10 MG tablet Take 1 tablet (10 mg total) by mouth daily. 90 tablet 1   traZODone (DESYREL) 100 MG tablet TAKE 2 TABLETS BY MOUTH EVERY DAY AT BEDTIME 180 tablet 1   ULTICARE INSULIN SYRINGE 31G X 5/16" 0.5 ML MISC USE AS DIRECTED TO INJECT INSULIN 2 TIMES DAILY 100 each 4   No current facility-administered medications on file prior to visit.     PAST MEDICAL HISTORY: Past Medical History:  Diagnosis Date   Allergy    generic allergy pill; Spring and Fall only.   Anxiety    Arthritis    DDD lumbar, R hip OA.  s/p ortho consult in past.   Blood transfusion without reported diagnosis  Mountain climbing accident in [place].   Brachial plexus disorders    Cataract    B retractions.   Chronic kidney disease    stage 3 per pt.    Chronic pain syndrome    Chronic renal insufficiency, stage 3 (moderate)    Constipation    DDD (degenerative disc disease), lumbar    Depression    Diabetes mellitus    Diabetic peripheral neuropathy associated with type 2 diabetes mellitus (HCC)    Diabetic retinopathy ([place])    Diabetic retinopathy associated with type 2 diabetes mellitus ([place])    s/p laser treatment multiple.  Unable to drive.   Fatty liver    Fibromyalgia    Food allergy    GERD (gastroesophageal reflux disease)    Hypercholesteremia    Hyperlipidemia    Hypertension    controlled, off meds    IBS (irritable bowel syndrome)    Leg edema    Neuromuscular disorder (HCC)    OSA (obstructive sleep apnea)    Osteoarthritis    Rheumatic fever    Rheumatoid arthritis (HCC)    Stomach ulcer    Swallowing difficulty    TIA (transient ischemic attack)    Ulcer    Peptic ulcer H. Pylori + s/p treatment.  Upper GI diagnosed.[name] Prilosec PRN .    PAST SURGICAL  HISTORY: Past Surgical History:  Procedure Laterality Date    2 SPINAL INJECTIONS      ABDOMINAL HYSTERECTOMY  11/02/1979   DUB; cervical dysplasia; ovaries intact.   ABDOMINAL SURGERY     staph abcess    [facility] Ad[facility]     age 13; three months in [place].   BREAST BIOPSY     CARDIAC CATHETERIZATION  11/02/2007   normal coronary arteries.   CARPAL TUNNEL RELEASE     Bilateral.   CATARACT EXTRACTION, BILATERAL     CHOLECYSTECTOMY     ESOPHAGEAL MANOMETRY N/A 09/14/2017   Procedure: ESOPHAGEAL MANOMETRY (EM);  Surgeon: Ronnette Juniper, MD;  Location: [facility];  Service: Gastroenterology;  Laterality: N/A;   EYE SURGERY     Cataracts B. Laser surgery x 7 for Diabetic Retinopathy   TONSILLECTOMY      SOCIAL HISTORY: Social History   Tobacco Use   Smoking status: Former Smoker   Smokeless tobacco: Never Used   Tobacco comment: Quit 1987  Substance Use Topics   Alcohol use: No    Alcohol/week: 0.0 standard drinks   Drug use: No    FAMILY HISTORY: Family History  Adopted: Yes  Family history unknown: Yes    ROS: Review of Systems  Constitutional: Negative for weight loss.  Cardiovascular: Negative for chest pain and claudication.  Musculoskeletal: Negative for myalgias.    PHYSICAL EXAM: Pt in no acute distress  RECENT LABS AND TESTS: BMET    Component Value Date/Time   NA 143 01/02/2019 1258   K 3.9 01/02/2019 1258   CL 100 01/02/2019 1258   CO2 24 01/02/2019 1258   GLUCOSE 38 (LL) 01/02/2019 1258   GLUCOSE 58 (L) 09/08/2016 1035   BUN 31 (H) 01/02/2019 1258   CREATININE 1.43 (H) 01/02/2019 1258   CREATININE 1.30 (H) 09/08/2016 1035   CALCIUM 9.9 01/02/2019 1258   GFRNONAA 38 (L) 01/02/2019 1258   GFRNONAA 36 (L) 07/10/2014 1354   GFRAA 44 (L) 01/02/2019 1258   GFRAA 41 (L) 07/10/2014 1354   Lab Results  Component Value Date   HGBA1C 5.1 01/02/2019   HGBA1C  6.0 (H) 08/03/2018   HGBA1C 8.1 (H) 03/07/2018   HGBA1C 11.0  01/09/2018   HGBA1C 8.0 10/07/2017   Lab Results  Component Value Date   INSULIN 6.2 03/07/2018   CBC    Component Value Date/Time   WBC 5.1 01/30/2018 1000   WBC 5.7 09/08/2016 1035   RBC 3.38 (L) 01/30/2018 1000   RBC 3.82 09/08/2016 1035   HGB 10.9 (L) 01/30/2018 1000   HCT 30.8 (L) 01/30/2018 1000   PLT 226 01/30/2018 1000   MCV 91 01/30/2018 1000   MCH 32.2 01/30/2018 1000   MCH 29.8 09/08/2016 1035   MCHC 35.4 01/30/2018 1000   MCHC 32.5 09/08/2016 1035   RDW 14.8 01/30/2018 1000   LYMPHSABS 1.7 01/30/2018 1000   MONOABS 513 09/08/2016 1035   EOSABS 0.3 01/30/2018 1000   BASOSABS 0.0 01/30/2018 1000   Iron/TIBC/Ferritin/ %Sat    Component Value Date/Time   IRON 79 02/03/2016 1015   TIBC 309 02/03/2016 1015   IRONPCTSAT 26 02/03/2016 1015   Lipid Panel     Component Value Date/Time   CHOL 162 01/02/2019 1258   TRIG 225 (H) 01/02/2019 1258   HDL 37 (L) 01/02/2019 1258   CHOLHDL 3.9 08/03/2018 1209   CHOLHDL 5.3 (H) 09/08/2016 1035   VLDL 46 (H) 09/08/2016 1035   LDLCALC 80 01/02/2019 1258   Hepatic Function Panel     Component Value Date/Time   PROT 7.6 01/02/2019 1258   ALBUMIN 4.3 01/02/2019 1258   AST 23 01/02/2019 1258   ALT 16 01/02/2019 1258   ALKPHOS 108 01/02/2019 1258   BILITOT 0.3 01/02/2019 1258      Component Value Date/Time   TSH 0.958 03/07/2018 1310   TSH 0.874 01/30/2018 1000   TSH 1.060 03/23/2017 1230      I, Trixie Dredge, am acting as Location manager for Abby Potash, PA-C I, Abby Potash, PA-C have reviewed above note and agree with its content

## 2019-08-31 DIAGNOSIS — M25551 Pain in right hip: Secondary | ICD-10-CM | POA: Diagnosis not present

## 2019-09-12 ENCOUNTER — Ambulatory Visit (INDEPENDENT_AMBULATORY_CARE_PROVIDER_SITE_OTHER): Payer: Medicare Other | Admitting: Physician Assistant

## 2019-09-12 ENCOUNTER — Encounter (INDEPENDENT_AMBULATORY_CARE_PROVIDER_SITE_OTHER): Payer: Self-pay | Admitting: Physician Assistant

## 2019-09-12 ENCOUNTER — Other Ambulatory Visit: Payer: Self-pay

## 2019-09-12 DIAGNOSIS — E7849 Other hyperlipidemia: Secondary | ICD-10-CM

## 2019-09-12 DIAGNOSIS — M0609 Rheumatoid arthritis without rheumatoid factor, multiple sites: Secondary | ICD-10-CM | POA: Diagnosis not present

## 2019-09-12 DIAGNOSIS — Z6838 Body mass index (BMI) 38.0-38.9, adult: Secondary | ICD-10-CM

## 2019-09-12 NOTE — Progress Notes (Signed)
Office: [phone]  /  Fax: [phone] TeleHealth Visit:  PA TENNANT has verbally consented to this TeleHealth visit today. The patient is located at home, the provider is located at the [facility] office. The participants in this visit include the listed provider and patient. The visit was conducted today via Facetime.  HPI:   Chief Complaint: OBESITY Angel French is here to discuss her progress with her obesity treatment plan. She is on the  follow our protein rich vegetarian plan and is following her eating plan approximately 50 % of the time. She states she is exercising 0 minutes 0 times per week. Angel French's most recent weight is 210 lbs. She reports that she does not like he vegetarian plan. She would like to take a break from the plan as she is not able to find the food on the plan and is not motivated to continue.  We were unable to weigh the patient today for this TeleHealth visit. She feels as if she has gained weight since her last visit. She has lost 17 lbs since starting treatment with [name].  Hyperlipidemia Angel French has hyperlipidemia and has been trying to improve her cholesterol levels with intensive lifestyle modification including a low saturated fat diet, exercise and weight loss. She denies any chest pain, claudication or myalgias. She is on Crestor.   ASSESSMENT AND PLAN:  Other hyperlipidemia  Class 2 severe obesity with serious comorbidity and body mass index (BMI) of 38.0 to 38.9 in adult, unspecified obesity type ([place])  PLAN: Hyperlipidemia Angel French was informed of the American Heart Association Guidelines emphasizing intensive lifestyle modifications as the first line treatment for hyperlipidemia. We discussed many lifestyle modifications today in depth, and Angel French will continue to work on decreasing saturated fats such as fatty red meat, butter and many fried foods. She will also increase vegetables and lean protein in her diet and continue to work on  exercise and weight loss efforts.  Obesity Angel French is not currently in the action stage of change. As such, her goal is to maintain weight for now She has agreed to keep a food journal with 1200 calories and 85g of protein daily.  Angel French has been instructed to work up to a goal of 150 minutes of combined cardio and strengthening exercise per week for weight loss and overall health benefits. We discussed the following Behavioral Modification Strategies today: increasing lean protein intake and work on meal planning and easy cooking plans   Angel French has agreed to follow up with our clinic in 8 weeks. She was informed of the importance of frequent follow up visits to maximize her success with intensive lifestyle modifications for her multiple health conditions.  ALLERGIES: Allergies  Allergen Reactions  . Codeine Anaphylaxis  . Contrast Media [Iodinated Diagnostic Agents] Anaphylaxis  . Nitrofurantoin Monohyd Macro Anaphylaxis  . Betadine [Povidone Iodine] Itching  . Folic Acid Itching  . Gabapentin Other (See Comments)    Makes patient feel drunk  . Iodine Hives  . Lyrica [Pregabalin] Other (See Comments)    Makes patient feel drunk  . Red Dye Itching  . Ultram [Tramadol Hcl] Nausea And Vomiting    MEDICATIONS: Current Outpatient Medications on File Prior to Visit  Medication Sig Dispense Refill  . acetaminophen (TYLENOL) 325 MG tablet Take 650 mg by mouth every 6 (six) hours as needed (every 6 weeks before RA infusion).    Marland Kitchen allopurinol (ZYLOPRIM) 100 MG tablet Take 1 tablet (100 mg total) by mouth daily. Ov needed (  Patient taking differently: Take 200 mg by mouth daily. ) 90 tablet 3  . aspirin 325 MG tablet Take 325 mg by mouth daily.     . Blood Glucose Monitoring Suppl (BLOOD GLUCOSE METER KIT AND SUPPLIES) KIT Dispense based on patient and insurance preference. Use up to four times daily as directed. (FOR ICD-9 250.00, 250.01). 1 each 11  . Cholecalciferol (VITAMIN D3) 5000 units  CAPS Take 1 capsule by mouth daily.    . diclofenac sodium (VOLTAREN) 1 % GEL Apply 2 g topically 4 (four) times daily. (Patient taking differently: Apply 2 g topically 2 (two) times daily as needed (pain). ) 100 g 3  . diphenhydrAMINE (BENADRYL) 25 MG tablet Take 25 mg by mouth every 6 (six) hours as needed (every 6 weeks prior to RA infusion).    . DULoxetine (CYMBALTA) 30 MG capsule TAKE 1 CAPSULE BY MOUTH EVERY DAY 90 capsule 3  . escitalopram (LEXAPRO) 20 MG tablet TAKE 1 TABLET BY MOUTH EVERY DAY 90 tablet 1  . furosemide (LASIX) 20 MG tablet Take 1-3 tablets (20-60 mg total) by mouth daily. 200 tablet 1  . glucose blood test strip Check sugar three times daily  Dx: DMII insulin dependent with retinopathy, neuropathy controlled 300 each 3  . HYDROcodone-acetaminophen (NORCO) 10-325 MG tablet Take 1 tablet by mouth every 12 (twelve) hours as needed. (Patient taking differently: Take 1 tablet by mouth every 8 (eight) hours as needed. ) 60 tablet 0  . insulin aspart (NOVOLOG) 100 UNIT/ML injection Inject 25 Units into the skin 3 (three) times daily with meals. 10 mL 11  . Insulin Syringes, Disposable, U-100 0.5 ML MISC 28 Units by Does not apply route 2 (two) times daily. 100 each 11  . leflunomide (ARAVA) 20 MG tablet Take 20 mg by mouth daily.     Marland Kitchen LEVEMIR 100 UNIT/ML injection INJECT 60 UNITS AT BEDTIME AS DIRECTED (Patient taking differently: Inject 90 Units into the skin daily. ) 20 mL 1  . linaclotide (LINZESS) 290 MCG CAPS capsule Take 290 mcg by mouth daily before breakfast.    . Needles & Syringes MISC 1 Syringe by Does not apply route 2 (two) times daily. 100 each 11  . omeprazole (PRILOSEC) 20 MG capsule TAKE 1 CAPSULE BY MOUTH EVERY DAY 90 capsule 3  . oxybutynin (DITROPAN XL) 15 MG 24 hr tablet TAKE 1 TABLET BY MOUTH AT BEDTIME 90 tablet 3  . predniSONE (DELTASONE) 5 MG tablet Take 5 mg by mouth daily with breakfast. Only takes with RA Flare.    . rosuvastatin (CRESTOR) 10 MG tablet  Take 1 tablet (10 mg total) by mouth daily. 90 tablet 1  . traZODone (DESYREL) 100 MG tablet TAKE 2 TABLETS BY MOUTH EVERY DAY AT BEDTIME 180 tablet 1  . ULTICARE INSULIN SYRINGE 31G X 5/16" 0.5 ML MISC USE AS DIRECTED TO INJECT INSULIN 2 TIMES DAILY 100 each 4   No current facility-administered medications on file prior to visit.     PAST MEDICAL HISTORY: Past Medical History:  Diagnosis Date  . Allergy    generic allergy pill; Spring and Fall only.  . Anxiety   . Arthritis    DDD lumbar, R hip OA.  s/p ortho consult in past.  . Blood transfusion without reported diagnosis    Mountain climbing accident in [place].  . Brachial plexus disorders   . Cataract    B retractions.  . Chronic kidney disease    stage 3 per pt.   Marland Kitchen  Chronic pain syndrome   . Chronic renal insufficiency, stage 3 (moderate)   . Constipation   . DDD (degenerative disc disease), lumbar   . Depression   . Diabetes mellitus   . Diabetic peripheral neuropathy associated with type 2 diabetes mellitus ([place])   . Diabetic retinopathy ([place])   . Diabetic retinopathy associated with type 2 diabetes mellitus ([place])    s/p laser treatment multiple.  Unable to drive.  . Fatty liver   . Fibromyalgia   . Food allergy   . GERD (gastroesophageal reflux disease)   . Hypercholesteremia   . Hyperlipidemia   . Hypertension    controlled, off meds   . IBS (irritable bowel syndrome)   . Leg edema   . Neuromuscular disorder ([place])   . OSA (obstructive sleep apnea)   . Osteoarthritis   . Rheumatic fever   . Rheumatoid arthritis ([place])   . Stomach ulcer   . Swallowing difficulty   . TIA (transient ischemic attack)   . Ulcer    Peptic ulcer H. Pylori + s/p treatment.  Upper GI diagnosed.[name] Prilosec PRN .    PAST SURGICAL HISTORY: Past Surgical History:  Procedure Laterality Date  .  2 SPINAL INJECTIONS     . ABDOMINAL HYSTERECTOMY  11/02/1979   DUB; cervical dysplasia; ovaries intact.  . ABDOMINAL SURGERY      staph abcess   . [facility] Ad[facility]     age 27; three months in [place].  Marland Kitchen BREAST BIOPSY    . CARDIAC CATHETERIZATION  11/02/2007   normal coronary arteries.  . CARPAL TUNNEL RELEASE     Bilateral.  . CATARACT EXTRACTION, BILATERAL    . CHOLECYSTECTOMY    . ESOPHAGEAL MANOMETRY N/A 09/14/2017   Procedure: ESOPHAGEAL MANOMETRY (EM);  Surgeon: Ronnette Juniper, MD;  Location: [facility];  Service: Gastroenterology;  Laterality: N/A;  . EYE SURGERY     Cataracts B. Laser surgery x 7 for Diabetic Retinopathy  . TONSILLECTOMY      SOCIAL HISTORY: Social History   Tobacco Use  . Smoking status: Former Research scientist (life sciences)  . Smokeless tobacco: Never Used  . Tobacco comment: Quit 1987  Substance Use Topics  . Alcohol use: No    Alcohol/week: 0.0 standard drinks  . Drug use: No    FAMILY HISTORY: Family History  Adopted: Yes  Family history unknown: Yes    ROS: Review of Systems  Cardiovascular: Negative for chest pain and claudication.  Musculoskeletal: Negative for myalgias.    PHYSICAL EXAM: Pt in no acute distress  RECENT LABS AND TESTS: BMET    Component Value Date/Time   NA 143 01/02/2019 1258   K 3.9 01/02/2019 1258   CL 100 01/02/2019 1258   CO2 24 01/02/2019 1258   GLUCOSE 38 (LL) 01/02/2019 1258   GLUCOSE 58 (L) 09/08/2016 1035   BUN 31 (H) 01/02/2019 1258   CREATININE 1.43 (H) 01/02/2019 1258   CREATININE 1.30 (H) 09/08/2016 1035   CALCIUM 9.9 01/02/2019 1258   GFRNONAA 38 (L) 01/02/2019 1258   GFRNONAA 36 (L) 07/10/2014 1354   GFRAA 44 (L) 01/02/2019 1258   GFRAA 41 (L) 07/10/2014 1354   Lab Results  Component Value Date   HGBA1C 5.1 01/02/2019   HGBA1C 6.0 (H) 08/03/2018   HGBA1C 8.1 (H) 03/07/2018   HGBA1C 11.0 01/09/2018   HGBA1C 8.0 10/07/2017   Lab Results  Component Value Date   INSULIN 6.2 03/07/2018   CBC    Component Value  Date/Time   WBC 5.1 01/30/2018 1000   WBC 5.7 09/08/2016 1035   RBC 3.38 (L) 01/30/2018 1000   RBC 3.82  09/08/2016 1035   HGB 10.9 (L) 01/30/2018 1000   HCT 30.8 (L) 01/30/2018 1000   PLT 226 01/30/2018 1000   MCV 91 01/30/2018 1000   MCH 32.2 01/30/2018 1000   MCH 29.8 09/08/2016 1035   MCHC 35.4 01/30/2018 1000   MCHC 32.5 09/08/2016 1035   RDW 14.8 01/30/2018 1000   LYMPHSABS 1.7 01/30/2018 1000   MONOABS 513 09/08/2016 1035   EOSABS 0.3 01/30/2018 1000   BASOSABS 0.0 01/30/2018 1000   Iron/TIBC/Ferritin/ %Sat    Component Value Date/Time   IRON 79 02/03/2016 1015   TIBC 309 02/03/2016 1015   IRONPCTSAT 26 02/03/2016 1015   Lipid Panel     Component Value Date/Time   CHOL 162 01/02/2019 1258   TRIG 225 (H) 01/02/2019 1258   HDL 37 (L) 01/02/2019 1258   CHOLHDL 3.9 08/03/2018 1209   CHOLHDL 5.3 (H) 09/08/2016 1035   VLDL 46 (H) 09/08/2016 1035   LDLCALC 80 01/02/2019 1258   Hepatic Function Panel     Component Value Date/Time   PROT 7.6 01/02/2019 1258   ALBUMIN 4.3 01/02/2019 1258   AST 23 01/02/2019 1258   ALT 16 01/02/2019 1258   ALKPHOS 108 01/02/2019 1258   BILITOT 0.3 01/02/2019 1258      Component Value Date/Time   TSH 0.958 03/07/2018 1310   TSH 0.874 01/30/2018 1000   TSH 1.060 03/23/2017 1230      I, Renee Ramus, am acting as Location manager for Abby Potash, PA-C  I, Abby Potash, PA-C have reviewed above note and agree with its content

## 2019-12-03 DIAGNOSIS — M0609 Rheumatoid arthritis without rheumatoid factor, multiple sites: Secondary | ICD-10-CM | POA: Diagnosis not present

## 2019-12-03 DIAGNOSIS — M5136 Other intervertebral disc degeneration, lumbar region: Secondary | ICD-10-CM | POA: Diagnosis not present

## 2019-12-03 DIAGNOSIS — R682 Dry mouth, unspecified: Secondary | ICD-10-CM | POA: Diagnosis not present

## 2019-12-03 DIAGNOSIS — M109 Gout, unspecified: Secondary | ICD-10-CM | POA: Diagnosis not present

## 2019-12-03 DIAGNOSIS — Z6837 Body mass index (BMI) 37.0-37.9, adult: Secondary | ICD-10-CM | POA: Diagnosis not present

## 2019-12-03 DIAGNOSIS — Z79899 Other long term (current) drug therapy: Secondary | ICD-10-CM | POA: Diagnosis not present

## 2019-12-03 DIAGNOSIS — E669 Obesity, unspecified: Secondary | ICD-10-CM | POA: Diagnosis not present

## 2019-12-03 DIAGNOSIS — M25551 Pain in right hip: Secondary | ICD-10-CM | POA: Diagnosis not present

## 2019-12-03 DIAGNOSIS — M791 Myalgia, unspecified site: Secondary | ICD-10-CM | POA: Diagnosis not present

## 2019-12-03 DIAGNOSIS — M25531 Pain in right wrist: Secondary | ICD-10-CM | POA: Diagnosis not present

## 2019-12-03 DIAGNOSIS — M2550 Pain in unspecified joint: Secondary | ICD-10-CM | POA: Diagnosis not present

## 2019-12-05 DIAGNOSIS — M0609 Rheumatoid arthritis without rheumatoid factor, multiple sites: Secondary | ICD-10-CM | POA: Diagnosis not present

## 2019-12-24 ENCOUNTER — Other Ambulatory Visit: Payer: Self-pay | Admitting: Family Medicine

## 2019-12-24 DIAGNOSIS — Z1231 Encounter for screening mammogram for malignant neoplasm of breast: Secondary | ICD-10-CM

## 2020-01-04 ENCOUNTER — Ambulatory Visit
Admission: RE | Admit: 2020-01-04 | Discharge: 2020-01-04 | Disposition: A | Discharge status: Home / Self Care | Payer: Medicare Other | Source: Ambulatory Visit | Attending: Family Medicine | Admitting: Family Medicine

## 2020-01-04 ENCOUNTER — Other Ambulatory Visit: Payer: Self-pay

## 2020-01-04 DIAGNOSIS — Z1231 Encounter for screening mammogram for malignant neoplasm of breast: Secondary | ICD-10-CM

## 2020-01-09 ENCOUNTER — Other Ambulatory Visit: Payer: Self-pay

## 2020-01-09 ENCOUNTER — Telehealth (INDEPENDENT_AMBULATORY_CARE_PROVIDER_SITE_OTHER): Payer: Medicare Other | Admitting: Physician Assistant

## 2020-01-09 ENCOUNTER — Encounter (INDEPENDENT_AMBULATORY_CARE_PROVIDER_SITE_OTHER): Payer: Self-pay | Admitting: Physician Assistant

## 2020-01-09 DIAGNOSIS — E119 Type 2 diabetes mellitus without complications: Secondary | ICD-10-CM | POA: Diagnosis not present

## 2020-01-09 DIAGNOSIS — Z6838 Body mass index (BMI) 38.0-38.9, adult: Secondary | ICD-10-CM | POA: Diagnosis not present

## 2020-01-09 NOTE — Progress Notes (Signed)
TeleHealth Visit:  Due to the COVID-19 pandemic, this visit was completed with telemedicine (audio/video) technology to reduce patient and provider exposure as well as to preserve personal protective equipment.   Angel French has verbally consented to this TeleHealth visit. The patient is located at home, the provider is located at the [facility] office. The participants in this visit include the listed provider and patient. The visit was conducted today via FaceTime.  Chief Complaint: OBESITY Angel French is here to discuss her progress with her obesity treatment plan along with follow-up of her obesity related diagnoses. Angel French is keeping a food journal and adhering to recommended goals of 1200 calories and 80 grams of protein and states she is following her eating plan approximately 50% of the time. Angel French states she is exercising 0 minutes 0 times per week.  Today's visit was #: 12 Starting weight: 224 lbs Starting date: 03/07/2018  Interim History: Angel French reports that she has continued to journal on and off since her last visit 5 months ago. She states that she is a picky eater and has a difficult time getting in her protein. She does not eat breakfast.  Subjective:   Type 2 diabetes mellitus without complication, without long-term current use of insulin ([place]). Angel French is on Levemir and NovoLog. No hypoglycemia.  Lab Results  Component Value Date   HGBA1C 5.1 01/02/2019   HGBA1C 6.0 (H) 08/03/2018   HGBA1C 8.1 (H) 03/07/2018   Lab Results  Component Value Date   MICROALBUR 0.4 10/06/2015   LDLCALC 80 01/02/2019   CREATININE 1.43 (H) 01/02/2019   Lab Results  Component Value Date   INSULIN 6.2 03/07/2018   Assessment/Plan:   Type 2 diabetes mellitus without complication, without long-term current use of insulin ([place]). Good blood sugar control is important to decrease the likelihood of diabetic complications such as nephropathy, neuropathy, limb loss,  blindness, coronary artery disease, and death. Intensive lifestyle modification including diet, exercise and weight loss are the first line of treatment for diabetes. Angel French will continue her medications as directed.  Class 2 severe obesity with serious comorbidity and body mass index (BMI) of 38.0 to 38.9 in adult, unspecified obesity type ([place]).  Angel French is currently in the action stage of change. As such, her goal is to continue with weight loss efforts. She has agreed to keeping a food journal and adhering to recommended goals of 1200 calories and 80 grams if protein.   Exercise goals: Older adults should follow the adult guidelines. When older adults cannot meet the adult guidelines, they should be as physically active as their abilities and conditions will allow.   Behavioral modification strategies: increasing lean protein intake and no skipping meals.  Angel French has agreed to follow-up with our clinic in 3 weeks. She was informed of the importance of frequent follow-up visits to maximize her success with intensive lifestyle modifications for her multiple health conditions.  Objective:   VITALS: Per patient if applicable, see vitals. GENERAL: Alert and in no acute distress. CARDIOPULMONARY: No increased WOB. Speaking in clear sentences.  PSYCH: Pleasant and cooperative. Speech normal rate and rhythm. Affect is appropriate. Insight and judgement are appropriate. Attention is focused, linear, and appropriate.  NEURO: Oriented as arrived to appointment on time with no prompting.   Lab Results  Component Value Date   CREATININE 1.43 (H) 01/02/2019   BUN 31 (H) 01/02/2019   NA 143 01/02/2019   K 3.9 01/02/2019   CL 100 01/02/2019  CO2 24 01/02/2019   Lab Results  Component Value Date   ALT 16 01/02/2019   AST 23 01/02/2019   ALKPHOS 108 01/02/2019   BILITOT 0.3 01/02/2019   Lab Results  Component Value Date   HGBA1C 5.1 01/02/2019   HGBA1C 6.0 (H) 08/03/2018   HGBA1C 8.1 (H)  03/07/2018   HGBA1C 11.0 01/09/2018   HGBA1C 8.0 10/07/2017   Lab Results  Component Value Date   INSULIN 6.2 03/07/2018   Lab Results  Component Value Date   TSH 0.958 03/07/2018   Lab Results  Component Value Date   CHOL 162 01/02/2019   HDL 37 (L) 01/02/2019   LDLCALC 80 01/02/2019   TRIG 225 (H) 01/02/2019   CHOLHDL 3.9 08/03/2018   Lab Results  Component Value Date   WBC 5.1 01/30/2018   HGB 10.9 (L) 01/30/2018   HCT 30.8 (L) 01/30/2018   MCV 91 01/30/2018   PLT 226 01/30/2018   Lab Results  Component Value Date   IRON 79 02/03/2016   TIBC 309 02/03/2016   Attestation Statements:   Reviewed by clinician on day of visit: allergies, medications, problem list, medical history, surgical history, family history, social history, and previous encounter notes.  Time spent on visit including pre-visit chart review and post-visit charting and care was 31 minutes.   IMichaelene Song, am acting as transcriptionist for Abby Potash, PA-C   I have reviewed the above documentation for accuracy and completeness, and I agree with the above. Abby Potash, PA-C

## 2020-01-10 ENCOUNTER — Ambulatory Visit: Payer: Medicare Other | Attending: Internal Medicine

## 2020-01-10 DIAGNOSIS — Z23 Encounter for immunization: Secondary | ICD-10-CM

## 2020-01-10 NOTE — Progress Notes (Signed)
   Covid-19 Vaccination Clinic  Name:  Angel French    MRN: [id number] DOB: [birth year]  01/10/2020  Angel French was observed post Covid-19 immunization for 15 minutes without incident. She was provided with Vaccine Information Sheet and instruction to access the V-Safe system.   Angel French was instructed to call 911 with any severe reactions post vaccine: Marland Kitchen Difficulty breathing  . Swelling of face and throat  . A fast heartbeat  . A bad rash all over body  . Dizziness and weakness   Immunizations Administered    Name Date Dose VIS Date Route   Pfizer COVID-19 Vaccine 01/10/2020  9:00 AM 0.3 mL 10/12/2019 Intramuscular   Manufacturer: [place]   Lot: [id number]   [place]: [id number]

## 2020-01-16 DIAGNOSIS — M0609 Rheumatoid arthritis without rheumatoid factor, multiple sites: Secondary | ICD-10-CM | POA: Diagnosis not present

## 2020-01-16 DIAGNOSIS — Z79899 Other long term (current) drug therapy: Secondary | ICD-10-CM | POA: Diagnosis not present

## 2020-01-25 DIAGNOSIS — M05742 Rheumatoid arthritis with rheumatoid factor of left hand without organ or systems involvement: Secondary | ICD-10-CM | POA: Diagnosis not present

## 2020-01-25 DIAGNOSIS — N1832 Chronic kidney disease, stage 3b: Secondary | ICD-10-CM | POA: Diagnosis not present

## 2020-01-25 DIAGNOSIS — M05741 Rheumatoid arthritis with rheumatoid factor of right hand without organ or systems involvement: Secondary | ICD-10-CM | POA: Diagnosis not present

## 2020-01-25 DIAGNOSIS — M5136 Other intervertebral disc degeneration, lumbar region: Secondary | ICD-10-CM | POA: Diagnosis not present

## 2020-01-25 DIAGNOSIS — E1121 Type 2 diabetes mellitus with diabetic nephropathy: Secondary | ICD-10-CM | POA: Diagnosis not present

## 2020-01-25 DIAGNOSIS — N3941 Urge incontinence: Secondary | ICD-10-CM | POA: Diagnosis not present

## 2020-01-25 DIAGNOSIS — F320 Major depressive disorder, single episode, mild: Secondary | ICD-10-CM | POA: Diagnosis not present

## 2020-01-25 DIAGNOSIS — G894 Chronic pain syndrome: Secondary | ICD-10-CM | POA: Diagnosis not present

## 2020-01-25 DIAGNOSIS — K219 Gastro-esophageal reflux disease without esophagitis: Secondary | ICD-10-CM | POA: Diagnosis not present

## 2020-01-25 DIAGNOSIS — M1A39X0 Chronic gout due to renal impairment, multiple sites, without tophus (tophi): Secondary | ICD-10-CM | POA: Diagnosis not present

## 2020-01-25 DIAGNOSIS — E1122 Type 2 diabetes mellitus with diabetic chronic kidney disease: Secondary | ICD-10-CM | POA: Diagnosis not present

## 2020-01-25 DIAGNOSIS — K5903 Drug induced constipation: Secondary | ICD-10-CM | POA: Diagnosis not present

## 2020-01-25 DIAGNOSIS — Z794 Long term (current) use of insulin: Secondary | ICD-10-CM | POA: Diagnosis not present

## 2020-01-28 ENCOUNTER — Other Ambulatory Visit: Payer: Self-pay

## 2020-01-28 ENCOUNTER — Telehealth (INDEPENDENT_AMBULATORY_CARE_PROVIDER_SITE_OTHER): Payer: Medicare Other | Admitting: Physician Assistant

## 2020-01-28 ENCOUNTER — Encounter (INDEPENDENT_AMBULATORY_CARE_PROVIDER_SITE_OTHER): Payer: Self-pay | Admitting: Physician Assistant

## 2020-01-28 DIAGNOSIS — Z794 Long term (current) use of insulin: Secondary | ICD-10-CM | POA: Diagnosis not present

## 2020-01-28 DIAGNOSIS — Z6838 Body mass index (BMI) 38.0-38.9, adult: Secondary | ICD-10-CM

## 2020-01-28 DIAGNOSIS — E119 Type 2 diabetes mellitus without complications: Secondary | ICD-10-CM

## 2020-01-28 NOTE — Progress Notes (Signed)
TeleHealth Visit:  Due to the COVID-19 pandemic, this visit was completed with telemedicine (audio/video) technology to reduce patient and provider exposure as well as to preserve personal protective equipment.   Angel French has verbally consented to this TeleHealth visit. The patient is located at home, the provider is located at the [facility] office. The participants in this visit include the listed provider and patient. The visit was conducted today via FaceTime.  Chief Complaint: OBESITY Angel French is here to discuss her progress with her obesity treatment plan along with follow-up of her obesity related diagnoses. Angel French is keeping a food journal and adhering to recommended goals of 1200-1300 calories and 85 grams of protein and states she is following her eating plan approximately 90% of the time. Angel French states she is exercising 0 minutes 0 times per week.  Today's visit was #: 48 Starting weight: 224 lbs Starting date: 03/07/2018  Interim History: Angel French states she is under a great deal of family stress. She has been trying to eat more chicken and will be buying more steak.  Subjective:   Type 2 diabetes mellitus without complication, with long-term current use of insulin ([place]). Most recent A1c 5.3. Angel French is on levemir and NovoLog. We discussed Angel French decreasing her insulin, but she is unwilling to decrease her units currently.  Lab Results  Component Value Date   HGBA1C 5.1 01/02/2019   HGBA1C 6.0 (H) 08/03/2018   HGBA1C 8.1 (H) 03/07/2018   Lab Results  Component Value Date   MICROALBUR 0.4 10/06/2015   LDLCALC 80 01/02/2019   CREATININE 1.43 (H) 01/02/2019   Lab Results  Component Value Date   INSULIN 6.2 03/07/2018   Assessment/Plan:   Type 2 diabetes mellitus without complication, with long-term current use of insulin (Angel French). Good blood sugar control is important to decrease the likelihood of diabetic complications such as nephropathy,  neuropathy, limb loss, blindness, coronary artery disease, and death. Intensive lifestyle modification including diet, exercise and weight loss are the first line of treatment for diabetes. Angel French will continue her medications as directed.  Class 2 severe obesity with serious comorbidity and body mass index (BMI) of 38.0 to 38.9 in adult, unspecified obesity type (Angel French).  Angel French is currently in the action stage of change. As such, her goal is to continue with weight loss efforts. She has agreed to keeping a food journal and adhering to recommended goals of 1200-1300 calories and 85 grams of protein daily.   Exercise goals: Older adults should follow the adult guidelines. When older adults cannot meet the adult guidelines, they should be as physically active as their abilities and conditions will allow.   Behavioral modification strategies: meal planning and cooking strategies and planning for success.  Angel French has agreed to follow-up with our clinic in 3 weeks. She was informed of the importance of frequent follow-up visits to maximize her success with intensive lifestyle modifications for her multiple health conditions.  Objective:   VITALS: Per patient if applicable, see vitals. GENERAL: Alert and in no acute distress. CARDIOPULMONARY: No increased WOB. Speaking in clear sentences.  PSYCH: Pleasant and cooperative. Speech normal rate and rhythm. Affect is appropriate. Insight and judgement are appropriate. Attention is focused, linear, and appropriate.  NEURO: Oriented as arrived to appointment on time with no prompting.   Lab Results  Component Value Date   CREATININE 1.43 (H) 01/02/2019   BUN 31 (H) 01/02/2019   NA 143 01/02/2019   K 3.9 01/02/2019   CL  100 01/02/2019   CO2 24 01/02/2019   Lab Results  Component Value Date   ALT 16 01/02/2019   AST 23 01/02/2019   ALKPHOS 108 01/02/2019   BILITOT 0.3 01/02/2019   Lab Results  Component Value Date   HGBA1C 5.1 01/02/2019    HGBA1C 6.0 (H) 08/03/2018   HGBA1C 8.1 (H) 03/07/2018   HGBA1C 11.0 01/09/2018   HGBA1C 8.0 10/07/2017   Lab Results  Component Value Date   INSULIN 6.2 03/07/2018   Lab Results  Component Value Date   TSH 0.958 03/07/2018   Lab Results  Component Value Date   CHOL 162 01/02/2019   HDL 37 (L) 01/02/2019   LDLCALC 80 01/02/2019   TRIG 225 (H) 01/02/2019   CHOLHDL 3.9 08/03/2018   Lab Results  Component Value Date   WBC 5.1 01/30/2018   HGB 10.9 (L) 01/30/2018   HCT 30.8 (L) 01/30/2018   MCV 91 01/30/2018   PLT 226 01/30/2018   Lab Results  Component Value Date   IRON 79 02/03/2016   TIBC 309 02/03/2016   Attestation Statements:   Reviewed by clinician on day of visit: allergies, medications, problem list, medical history, surgical history, family history, social history, and previous encounter notes.  Time spent on visit including pre-visit chart review and post-visit charting and care was 30 minutes.   IMichaelene Song, am acting as transcriptionist for Abby Potash, PA-C   I have reviewed the above documentation for accuracy and completeness, and I agree with the above. Abby Potash, PA-C

## 2020-02-05 DIAGNOSIS — N3941 Urge incontinence: Secondary | ICD-10-CM | POA: Insufficient documentation

## 2020-02-05 DIAGNOSIS — K5903 Drug induced constipation: Secondary | ICD-10-CM | POA: Insufficient documentation

## 2020-02-05 DIAGNOSIS — M1A39X0 Chronic gout due to renal impairment, multiple sites, without tophus (tophi): Secondary | ICD-10-CM | POA: Insufficient documentation

## 2020-02-05 DIAGNOSIS — K219 Gastro-esophageal reflux disease without esophagitis: Secondary | ICD-10-CM | POA: Insufficient documentation

## 2020-02-06 ENCOUNTER — Ambulatory Visit: Payer: Medicare Other | Attending: Internal Medicine

## 2020-02-06 DIAGNOSIS — Z23 Encounter for immunization: Secondary | ICD-10-CM

## 2020-02-06 NOTE — Progress Notes (Signed)
   Covid-19 Vaccination Clinic  Name:  Angel French    MRN: [id number] DOB: 14-Feb-1951  02/06/2020  [name] was observed post Covid-19 immunization for 30 minutes based on pre-vaccination screening without incident. She was provided with Vaccine Information Sheet and instruction to access the V-Safe system.   [name] was instructed to call 911 with any severe reactions post vaccine: Marland Kitchen Difficulty breathing  . Swelling of face and throat  . A fast heartbeat  . A bad rash all over body  . Dizziness and weakness   Immunizations Administered    Name Date Dose VIS Date Route   Pfizer COVID-19 Vaccine 02/06/2020  8:56 AM 0.3 mL 10/12/2019 Intramuscular   Manufacturer: [place]   Lot: [id number]   [place]: [id number]

## 2020-02-21 ENCOUNTER — Emergency Department (HOSPITAL_COMMUNITY): Payer: Medicare Other

## 2020-02-21 ENCOUNTER — Emergency Department (HOSPITAL_COMMUNITY)
Admission: EM | Admit: 2020-02-21 | Discharge: 2020-02-22 | Disposition: A | Discharge status: Home / Self Care | Payer: Medicare Other | Attending: Emergency Medicine | Admitting: Emergency Medicine

## 2020-02-21 ENCOUNTER — Ambulatory Visit (INDEPENDENT_AMBULATORY_CARE_PROVIDER_SITE_OTHER)
Admission: EM | Admit: 2020-02-21 | Discharge: 2020-02-21 | Disposition: A | Discharge status: Home / Self Care | Payer: Medicare Other | Source: Home / Self Care | Attending: Family Medicine | Admitting: Family Medicine

## 2020-02-21 ENCOUNTER — Encounter (HOSPITAL_COMMUNITY): Payer: Self-pay

## 2020-02-21 ENCOUNTER — Encounter (HOSPITAL_COMMUNITY): Payer: Self-pay | Admitting: Emergency Medicine

## 2020-02-21 ENCOUNTER — Other Ambulatory Visit: Payer: Self-pay

## 2020-02-21 DIAGNOSIS — Z7982 Long term (current) use of aspirin: Secondary | ICD-10-CM | POA: Insufficient documentation

## 2020-02-21 DIAGNOSIS — R1011 Right upper quadrant pain: Secondary | ICD-10-CM

## 2020-02-21 DIAGNOSIS — Z794 Long term (current) use of insulin: Secondary | ICD-10-CM | POA: Diagnosis not present

## 2020-02-21 DIAGNOSIS — I129 Hypertensive chronic kidney disease with stage 1 through stage 4 chronic kidney disease, or unspecified chronic kidney disease: Secondary | ICD-10-CM | POA: Diagnosis not present

## 2020-02-21 DIAGNOSIS — Z87891 Personal history of nicotine dependence: Secondary | ICD-10-CM | POA: Insufficient documentation

## 2020-02-21 DIAGNOSIS — E1140 Type 2 diabetes mellitus with diabetic neuropathy, unspecified: Secondary | ICD-10-CM | POA: Insufficient documentation

## 2020-02-21 DIAGNOSIS — E1122 Type 2 diabetes mellitus with diabetic chronic kidney disease: Secondary | ICD-10-CM | POA: Insufficient documentation

## 2020-02-21 DIAGNOSIS — N83202 Unspecified ovarian cyst, left side: Secondary | ICD-10-CM | POA: Diagnosis not present

## 2020-02-21 DIAGNOSIS — N183 Chronic kidney disease, stage 3 unspecified: Secondary | ICD-10-CM | POA: Insufficient documentation

## 2020-02-21 DIAGNOSIS — R1031 Right lower quadrant pain: Secondary | ICD-10-CM | POA: Diagnosis not present

## 2020-02-21 DIAGNOSIS — Z79899 Other long term (current) drug therapy: Secondary | ICD-10-CM | POA: Diagnosis not present

## 2020-02-21 DIAGNOSIS — Z8673 Personal history of transient ischemic attack (TIA), and cerebral infarction without residual deficits: Secondary | ICD-10-CM | POA: Diagnosis not present

## 2020-02-21 LAB — CBC
HCT: 31.8 % — ABNORMAL LOW (ref 36.0–46.0)
Hemoglobin: 10.1 g/dL — ABNORMAL LOW (ref 12.0–15.0)
MCH: 31.7 pg (ref 26.0–34.0)
MCHC: 31.8 g/dL (ref 30.0–36.0)
MCV: 99.7 fL (ref 80.0–100.0)
Platelets: 255 10*3/uL (ref 150–400)
RBC: 3.19 MIL/uL — ABNORMAL LOW (ref 3.87–5.11)
RDW: 14.2 % (ref 11.5–15.5)
WBC: 7.6 10*3/uL (ref 4.0–10.5)
nRBC: 0 % (ref 0.0–0.2)

## 2020-02-21 LAB — URINALYSIS, ROUTINE W REFLEX MICROSCOPIC
Bilirubin Urine: NEGATIVE
Glucose, UA: NEGATIVE mg/dL
Hgb urine dipstick: NEGATIVE
Ketones, ur: NEGATIVE mg/dL
Leukocytes,Ua: NEGATIVE
Nitrite: NEGATIVE
Protein, ur: NEGATIVE mg/dL
Specific Gravity, Urine: 1.005 (ref 1.005–1.030)
pH: 6 (ref 5.0–8.0)

## 2020-02-21 LAB — COMPREHENSIVE METABOLIC PANEL
ALT: 16 U/L (ref 0–44)
AST: 26 U/L (ref 15–41)
Albumin: 4.1 g/dL (ref 3.5–5.0)
Alkaline Phosphatase: 107 U/L (ref 38–126)
Anion gap: 12 (ref 5–15)
BUN: 21 mg/dL (ref 8–23)
CO2: 29 mmol/L (ref 22–32)
Calcium: 9.1 mg/dL (ref 8.9–10.3)
Chloride: 99 mmol/L (ref 98–111)
Creatinine, Ser: 1.47 mg/dL — ABNORMAL HIGH (ref 0.44–1.00)
GFR calc Af Amer: 42 mL/min — ABNORMAL LOW (ref >60)
GFR calc non Af Amer: 36 mL/min — ABNORMAL LOW (ref >60)
Glucose, Bld: 182 mg/dL — ABNORMAL HIGH (ref 70–99)
Potassium: 3.9 mmol/L (ref 3.5–5.1)
Sodium: 140 mmol/L (ref 135–145)
Total Bilirubin: 0.6 mg/dL (ref 0.3–1.2)
Total Protein: 7.7 g/dL (ref 6.5–8.1)

## 2020-02-21 LAB — LIPASE, BLOOD: Lipase: 45 U/L (ref 11–51)

## 2020-02-21 MED ORDER — HYDROMORPHONE HCL 1 MG/ML IJ SOLN
1.0000 mg | Freq: Once | INTRAMUSCULAR | Status: DC
  Filled 2020-02-21: qty 1

## 2020-02-21 MED ORDER — SODIUM CHLORIDE 0.9% FLUSH: 3.0000 mL | Freq: Once | INTRAVENOUS | Status: DC

## 2020-02-21 NOTE — Discharge Instructions (Addendum)
At this time there is no specific cause identified for your pain.  Watch for any rash or blisters that could suggest shingles. Return if you develop fever, worsening pain or new symptoms. Continue to treat pain with your home medications. See your doctor for recheck within the next 3 to 5 days.

## 2020-02-21 NOTE — ED Triage Notes (Signed)
Pt reports about week ago had the insulin injection in right upper abd. Reports since then having pain and believe some swelling. Hx staph infection.

## 2020-02-21 NOTE — ED Provider Notes (Signed)
[place]    CSN: [id number] Arrival date & time: 02/21/20  1610      History   Chief Complaint Chief Complaint  Patient presents with  . Pain from Insulin Injection Site    HPI Aliceson Lenon Oms is a 69 y.o. female.   HPI  Patient is here for abdominal pain.  She states that she had an insulin injection about a week ago.  There is a small bruise remaining.  She states that she has upwards of 4 insulin shots a day.  Usually she does not feel them.  This when she remembers because it was painful.  She states that a couple days afterwards it started to hurt.  She now has pain in a band across her lower abdomen from the bruise to her flank in the posterior axillary line.  The skin is very sensitive to touch.  The area is mildly swollen. She has had no fever or chills. No redness or warmth to the skin. She does have a history of a staph infection in the prior insulin injection, but she states that this was her fault from cleaning properly.  She states she changed her habits and now cleans the skin thoroughly. She states her sugars are very well controlled with hemoglobin A1c's that remain under 6 She does endorse kidney disease She has chronic pain.  She takes hydrocodone 10 mg 4 times daily.  She has been given Neurontin and Lyrica for her neuropathic pain but both of these medicines caused her to "feel drunk".  She tried using a lidocaine cream on the skin and this did not help.  Past Medical History:  Diagnosis Date  . Allergy    generic allergy pill; Spring and Fall only.  . Anxiety   . Arthritis    DDD lumbar, R hip OA.  s/p ortho consult in past.  . Blood transfusion without reported diagnosis    Mountain climbing accident in [place].  . Brachial plexus disorders   . Cataract    B retractions.  . Chronic kidney disease    stage 3 per pt.   . Chronic pain syndrome   . Chronic renal insufficiency, stage 3 (moderate)   . Constipation   . DDD (degenerative  disc disease), lumbar   . Depression   . Diabetes mellitus   . Diabetic peripheral neuropathy associated with type 2 diabetes mellitus ([place])   . Diabetic retinopathy ([place])   . Diabetic retinopathy associated with type 2 diabetes mellitus ([place])    s/p laser treatment multiple.  Unable to drive.  . Fatty liver   . Fibromyalgia   . Food allergy   . GERD (gastroesophageal reflux disease)   . Hypercholesteremia   . Hyperlipidemia   . Hypertension    controlled, off meds   . IBS (irritable bowel syndrome)   . Leg edema   . Neuromuscular disorder ([place])   . OSA (obstructive sleep apnea)   . Osteoarthritis   . Rheumatic fever   . Rheumatoid arthritis ([place])   . Stomach ulcer   . Swallowing difficulty   . TIA (transient ischemic attack)   . Ulcer    Peptic ulcer H. Pylori + s/p treatment.  Upper GI diagnosed.[name] Prilosec PRN .    Patient Active Problem List   Diagnosis Date Noted  . Vitamin D deficiency 01/03/2019  . Visual changes 04/11/2018  . Paresthesia 04/11/2018  . OSA (obstructive sleep apnea) 09/09/2017  . Nocturnal hypoxemia due to obesity  09/09/2017  . Type 2 diabetes mellitus with diabetic polyneuropathy, with long-term current use of insulin ([place]) 09/09/2017  . Hypersomnia with sleep apnea 07/21/2017  . Elevated blood uric acid level 01/25/2017  . Plantar fasciitis of left foot 01/25/2017  . Venous stasis 08/23/2016  . Rheumatoid arthritis involving both hands with positive rheumatoid factor ([place]) 10/30/2015  . Chronic pain syndrome 01/22/2015  . Chronic renal insufficiency 01/22/2015  . Pure hypercholesterolemia 01/01/2013  . Essential hypertension, benign 01/01/2013  . Degenerative disc disease, lumbar 01/01/2013  . Depression 01/01/2013  . Osteoarthritis of right hip 01/01/2013  . Diabetic peripheral neuropathy associated with type 2 diabetes mellitus ([place]) 01/01/2013  . Diabetic retinopathy ([place]) 01/01/2013  . Obesity 01/01/2013    Past Surgical  History:  Procedure Laterality Date  .  2 SPINAL INJECTIONS     . ABDOMINAL HYSTERECTOMY  11/02/1979   DUB; cervical dysplasia; ovaries intact.  . ABDOMINAL SURGERY     staph abcess   . [facility] Ad[facility]     age 82; three months in [place].  Marland Kitchen BREAST BIOPSY    . CARDIAC CATHETERIZATION  11/02/2007   normal coronary arteries.  . CARPAL TUNNEL RELEASE     Bilateral.  . CATARACT EXTRACTION, BILATERAL    . CHOLECYSTECTOMY    . ESOPHAGEAL MANOMETRY N/A 09/14/2017   Procedure: ESOPHAGEAL MANOMETRY (EM);  Surgeon: Ronnette Juniper, MD;  Location: [facility];  Service: Gastroenterology;  Laterality: N/A;  . EYE SURGERY     Cataracts B. Laser surgery x 7 for Diabetic Retinopathy  . TONSILLECTOMY      OB History    Gravida  1   Para      Term      Preterm      AB      Living  1     SAB      TAB      Ectopic      Multiple      Live Births               Home Medications    Prior to Admission medications   Medication Sig Start Date End Date Taking? Authorizing Provider  acetaminophen (TYLENOL) 325 MG tablet Take 650 mg by mouth every 6 (six) hours as needed (every 6 weeks before RA infusion).    [provider]  allopurinol (ZYLOPRIM) 100 MG tablet Take 1 tablet (100 mg total) by mouth daily. Ov needed Patient taking differently: Take 200 mg by mouth daily.  06/28/17   Wardell Honour, MD  aspirin 325 MG tablet Take 325 mg by mouth daily.     [provider]  Blood Glucose Monitoring Suppl (BLOOD GLUCOSE METER KIT AND SUPPLIES) KIT Dispense based on patient and insurance preference. Use up to four times daily as directed. (FOR ICD-9 250.00, 250.01). 07/19/14   Weber, Damaris Hippo, PA-C  Cholecalciferol (VITAMIN D3) 5000 units CAPS Take 1 capsule by mouth daily.    [provider]  diclofenac sodium (VOLTAREN) 1 % GEL Apply 2 g topically 4 (four) times daily. Patient taking differently: Apply 2 g topically 2 (two) times daily as needed  (pain).  11/05/14   Wardell Honour, MD  diphenhydrAMINE (BENADRYL) 25 MG tablet Take 25 mg by mouth every 6 (six) hours as needed (every 6 weeks prior to RA infusion).    [provider]  DULoxetine (CYMBALTA) 30 MG capsule TAKE 1 CAPSULE BY MOUTH EVERY DAY Patient taking differently: Take 60 mg  by mouth daily.  04/19/18   Wardell Honour, MD  escitalopram (LEXAPRO) 20 MG tablet TAKE 1 TABLET BY MOUTH EVERY DAY Patient taking differently: Take 10 mg by mouth daily.  03/28/18   Wardell Honour, MD  famotidine (PEPCID) 40 MG tablet Take 40 mg by mouth at bedtime. 01/20/20   [provider]  furosemide (LASIX) 20 MG tablet Take 1-3 tablets (20-60 mg total) by mouth daily. 04/12/18   Wardell Honour, MD  glucose blood test strip Check sugar three times daily  Dx: DMII insulin dependent with retinopathy, neuropathy controlled 05/03/17   Wardell Honour, MD  HYDROcodone-acetaminophen [facility]) 10-325 MG tablet Take 1 tablet by mouth every 12 (twelve) hours as needed. Patient taking differently: Take 1 tablet by mouth every 8 (eight) hours as needed for moderate pain.  04/12/18   Wardell Honour, MD  insulin aspart (NOVOLOG) 100 UNIT/ML injection Inject 25 Units into the skin 3 (three) times daily with meals. Patient taking differently: Inject 30 Units into the skin 3 (three) times daily with meals.  01/03/19   [place], Joneen Boers, FNP  Insulin Syringes, Disposable, U-100 0.5 ML MISC 28 Units by Does not apply route 2 (two) times daily. 10/30/14   Wardell Honour, MD  leflunomide (ARAVA) 20 MG tablet Take 20 mg by mouth daily.  12/10/16   [provider]  LEVEMIR 100 UNIT/ML injection INJECT 60 UNITS AT BEDTIME AS DIRECTED Patient taking differently: Inject 70 Units into the skin at bedtime.  05/31/18   Wardell Honour, MD  linaclotide Rolan Lipa) 290 MCG CAPS capsule Take 290 mcg by mouth daily before breakfast.     [provider]  Needles & Syringes MISC 1 Syringe by Does not apply  route 2 (two) times daily. 12/29/14   Wardell Honour, MD  omeprazole (PRILOSEC) 20 MG capsule TAKE 1 CAPSULE BY MOUTH EVERY DAY Patient taking differently: Take 20 mg by mouth daily.  02/23/18   Wardell Honour, MD  oxybutynin (DITROPAN XL) 15 MG 24 hr tablet TAKE 1 TABLET BY MOUTH AT BEDTIME Patient taking differently: Take 15 mg by mouth at bedtime.  04/06/18   Wardell Honour, MD  predniSONE (DELTASONE) 5 MG tablet Take 5 mg by mouth daily as needed. Only takes with RA Flare.    [provider]  rosuvastatin (CRESTOR) 10 MG tablet Take 1 tablet (10 mg total) by mouth daily. 01/09/18   Wardell Honour, MD  traZODone (DESYREL) 100 MG tablet TAKE 2 TABLETS BY MOUTH EVERY DAY AT BEDTIME Patient taking differently: Take 200 mg by mouth at bedtime.  06/01/18   Wardell Honour, MD  Flossie Buffy INSULIN SYRINGE 31G X 5/16" 0.5 ML MISC USE AS DIRECTED TO INJECT INSULIN 2 TIMES DAILY 05/01/18   Wardell Honour, MD    Family History Family History  Adopted: Yes  Problem Relation Age of Onset  . Breast cancer Daughter     Social History Social History   Tobacco Use  . Smoking status: Former Research scientist (life sciences)  . Smokeless tobacco: Never Used  . Tobacco comment: Quit 1987  Substance Use Topics  . Alcohol use: No    Alcohol/week: 0.0 standard drinks  . Drug use: No     Allergies   Codeine, Contrast media [iodinated diagnostic agents], Nitrofurantoin monohyd macro, Betadine [povidone iodine], Folic acid, Gabapentin, Iodine, Lyrica [pregabalin], Red dye, and Ultram [tramadol hcl]   Review of Systems Review of Systems  Gastrointestinal: Positive for abdominal  pain.     Physical Exam Triage Vital Signs ED Triage Vitals [02/21/20 1712]  Enc Vitals Group     BP (!) 155/76     Pulse Rate 79     Resp 20     Temp 98.9 F (37.2 C)     Temp Source Oral     SpO2 99 %     Weight      Height      Head Circumference      Peak Flow      Pain Score 8     Pain Loc      Pain Edu?      Excl. in [place]?     No data found.  Updated Vital Signs BP (!) 155/76 (BP Location: Right Arm)   Pulse 79   Temp 98.9 F (37.2 C) (Oral)   Resp 20   SpO2 99%      Physical Exam Constitutional:      General: She is not in acute distress.    Appearance: She is well-developed. She is obese.     Comments: Abdominal obesity  HENT:     Head: Normocephalic and atraumatic.     Nose:     Comments: Mask is in place Eyes:     Conjunctiva/sclera: Conjunctivae normal.     Pupils: Pupils are equal, round, and reactive to light.  Cardiovascular:     Rate and Rhythm: Normal rate and regular rhythm.     Heart sounds: Normal heart sounds.  Pulmonary:     Effort: Pulmonary effort is normal. No respiratory distress.     Breath sounds: Normal breath sounds.  Abdominal:     General: There is no distension.     Palpations: Abdomen is soft.       Comments: The area surrounding the bruise is slightly raised as opposed to the opposite side.  Well-healed midline scar.  The entire area is quite tender to palpation.  It is difficult to appreciate but I think there is some induration in the skin surrounding the bruise  Musculoskeletal:        General: Normal range of motion.     Cervical back: Normal range of motion.  Skin:    General: Skin is warm and dry.  Neurological:     Mental Status: She is alert.  Psychiatric:        Mood and Affect: Mood normal.        Behavior: Behavior normal.      UC Treatments / Results  Labs (all labs ordered are listed, but only abnormal results are displayed) Labs Reviewed - No data to display  EKG   Radiology No results found.  Procedures Procedures (including critical care time)  Medications Ordered in UC Medications - No data to display  Initial Impression / Assessment and Plan / UC Course  I have reviewed the triage vital signs and the nursing notes.  Pertinent labs & imaging results that were available during my care of the patient were reviewed by me and  considered in my medical decision making (see chart for details).     The patient has pain that is worsening over time.  She has some soft tissue swelling in hyperesthesia.  I would predict she is going to break out in shingles rash although its been 5 days already.  I am unable to further evaluate this problem in the urgent care center.  I do not think lab work is going to help me, nor  standard x-rays.  She may benefit from ultrasound or imaging of this area.  She is referred to the emergency room for further evaluation Final Clinical Impressions(s) / UC Diagnoses   Final diagnoses:  Abdominal wall pain in right upper quadrant     Discharge Instructions     Go to the ER for evaluation   ED Prescriptions    None     PDMP not reviewed this encounter.   Raylene Everts, MD 02/21/20 2127

## 2020-02-21 NOTE — ED Provider Notes (Signed)
[facility] DEPT Provider Note   CSN: [id number] Arrival date & time: 02/21/20  1817     History Chief Complaint  Patient presents with  . Abdominal Pain    insulin injection site    Angel French is a 69 y.o. female.  HPI Patient reports that she right lateral abdomen about a week ago.  She reports that it was very painful.  She normally does not inject on the right side.  It seems to abate a bit and she just assumed it was due to her injection.  However, pain continued to worsen and she has intense burning and discomfort over the skin on the right upper abdomen.  There is still a small visible bruise.  She reports the skin is very sensitive and burning just to light touch.  She is worried because in the past she at one point developed a MRSA infection in her abdominal wall.  Required a large excision and drainage.  You want to make sure she was not getting that problem.  She has not had any fever or chills.  No other associated symptoms.    Past Medical History:  Diagnosis Date  . Allergy    generic allergy pill; Spring and Fall only.  . Anxiety   . Arthritis    DDD lumbar, R hip OA.  s/p ortho consult in past.  . Blood transfusion without reported diagnosis    Mountain climbing accident in [place].  . Brachial plexus disorders   . Cataract    B retractions.  . Chronic kidney disease    stage 3 per pt.   . Chronic pain syndrome   . Chronic renal insufficiency, stage 3 (moderate)   . Constipation   . DDD (degenerative disc disease), lumbar   . Depression   . Diabetes mellitus   . Diabetic peripheral neuropathy associated with type 2 diabetes mellitus ([place])   . Diabetic retinopathy ([place])   . Diabetic retinopathy associated with type 2 diabetes mellitus ([place])    s/p laser treatment multiple.  Unable to drive.  . Fatty liver   . Fibromyalgia   . Food allergy   . GERD (gastroesophageal reflux disease)   . Hypercholesteremia   .  Hyperlipidemia   . Hypertension    controlled, off meds   . IBS (irritable bowel syndrome)   . Leg edema   . Neuromuscular disorder ([place])   . OSA (obstructive sleep apnea)   . Osteoarthritis   . Rheumatic fever   . Rheumatoid arthritis ([place])   . Stomach ulcer   . Swallowing difficulty   . TIA (transient ischemic attack)   . Ulcer    Peptic ulcer H. Pylori + s/p treatment.  Upper GI diagnosed.[name] Prilosec PRN .    Patient Active Problem List   Diagnosis Date Noted  . Vitamin D deficiency 01/03/2019  . Visual changes 04/11/2018  . Paresthesia 04/11/2018  . OSA (obstructive sleep apnea) 09/09/2017  . Nocturnal hypoxemia due to obesity 09/09/2017  . Type 2 diabetes mellitus with diabetic polyneuropathy, with long-term current use of insulin ([place]) 09/09/2017  . Hypersomnia with sleep apnea 07/21/2017  . Elevated blood uric acid level 01/25/2017  . Plantar fasciitis of left foot 01/25/2017  . Venous stasis 08/23/2016  . Rheumatoid arthritis involving both hands with positive rheumatoid factor ([place]) 10/30/2015  . Chronic pain syndrome 01/22/2015  . Chronic renal insufficiency 01/22/2015  . Pure hypercholesterolemia 01/01/2013  . Essential hypertension, benign 01/01/2013  .  Degenerative disc disease, lumbar 01/01/2013  . Depression 01/01/2013  . Osteoarthritis of right hip 01/01/2013  . Diabetic peripheral neuropathy associated with type 2 diabetes mellitus ([place]) 01/01/2013  . Diabetic retinopathy ([place]) 01/01/2013  . Obesity 01/01/2013    Past Surgical History:  Procedure Laterality Date  .  2 SPINAL INJECTIONS     . ABDOMINAL HYSTERECTOMY  11/02/1979   DUB; cervical dysplasia; ovaries intact.  . ABDOMINAL SURGERY     staph abcess   . [facility] Ad[facility]     age 56; three months in [place].  Marland Kitchen BREAST BIOPSY    . CARDIAC CATHETERIZATION  11/02/2007   normal coronary arteries.  . CARPAL TUNNEL RELEASE     Bilateral.  . CATARACT EXTRACTION, BILATERAL    .  CHOLECYSTECTOMY    . ESOPHAGEAL MANOMETRY N/A 09/14/2017   Procedure: ESOPHAGEAL MANOMETRY (EM);  Surgeon: Ronnette Juniper, MD;  Location: [facility] ENDOSCOPY;  Service: Gastroenterology;  Laterality: N/A;  . EYE SURGERY     Cataracts B. Laser surgery x 7 for Diabetic Retinopathy  . TONSILLECTOMY       OB History    Gravida  1   Para      Term      Preterm      AB      Living  1     SAB      TAB      Ectopic      Multiple      Live Births              Family History  Adopted: Yes  Problem Relation Age of Onset  . Breast cancer Daughter     Social History   Tobacco Use  . Smoking status: Former Research scientist (life sciences)  . Smokeless tobacco: Never Used  . Tobacco comment: Quit 1987  Substance Use Topics  . Alcohol use: No    Alcohol/week: 0.0 standard drinks  . Drug use: No    Home Medications Prior to Admission medications   Medication Sig Start Date End Date Taking? Authorizing Provider  acetaminophen (TYLENOL) 325 MG tablet Take 650 mg by mouth every 6 (six) hours as needed (every 6 weeks before RA infusion).   Yes [provider]  allopurinol (ZYLOPRIM) 100 MG tablet Take 1 tablet (100 mg total) by mouth daily. Ov needed Patient taking differently: Take 200 mg by mouth daily.  06/28/17  Yes Wardell Honour, MD  aspirin 325 MG tablet Take 325 mg by mouth daily.    Yes [provider]  Cholecalciferol (VITAMIN D3) 5000 units CAPS Take 1 capsule by mouth daily.   Yes [provider]  diclofenac sodium (VOLTAREN) 1 % GEL Apply 2 g topically 4 (four) times daily. Patient taking differently: Apply 2 g topically 2 (two) times daily as needed (pain).  11/05/14  Yes Wardell Honour, MD  diphenhydrAMINE (BENADRYL) 25 MG tablet Take 25 mg by mouth every 6 (six) hours as needed (every 6 weeks prior to RA infusion).   Yes [provider]  DULoxetine (CYMBALTA) 30 MG capsule TAKE 1 CAPSULE BY MOUTH EVERY DAY Patient taking differently: Take 60 mg by mouth  daily.  04/19/18  Yes Wardell Honour, MD  escitalopram (LEXAPRO) 20 MG tablet TAKE 1 TABLET BY MOUTH EVERY DAY Patient taking differently: Take 10 mg by mouth daily.  03/28/18  Yes Wardell Honour, MD  famotidine (PEPCID) 40 MG tablet Take 40 mg by mouth at bedtime. 01/20/20  Yes  [provider]  furosemide (LASIX) 20 MG tablet Take 1-3 tablets (20-60 mg total) by mouth daily. 04/12/18  Yes Wardell Honour, MD  HYDROcodone-acetaminophen [facility]) 10-325 MG tablet Take 1 tablet by mouth every 12 (twelve) hours as needed. Patient taking differently: Take 1 tablet by mouth every 8 (eight) hours as needed for moderate pain.  04/12/18  Yes Wardell Honour, MD  insulin aspart (NOVOLOG) 100 UNIT/ML injection Inject 25 Units into the skin 3 (three) times daily with meals. Patient taking differently: Inject 30 Units into the skin 3 (three) times daily with meals.  01/03/19  Yes Whitmire, Dawn W, FNP  leflunomide (ARAVA) 20 MG tablet Take 20 mg by mouth daily.  12/10/16  Yes [provider]  LEVEMIR 100 UNIT/ML injection INJECT 60 UNITS AT BEDTIME AS DIRECTED Patient taking differently: Inject 70 Units into the skin at bedtime.  05/31/18  Yes Wardell Honour, MD  linaclotide [facility]) 290 MCG CAPS capsule Take 290 mcg by mouth daily before breakfast.    Yes [provider]  omeprazole (PRILOSEC) 20 MG capsule TAKE 1 CAPSULE BY MOUTH EVERY DAY Patient taking differently: Take 20 mg by mouth daily.  02/23/18  Yes Wardell Honour, MD  oxybutynin (DITROPAN XL) 15 MG 24 hr tablet TAKE 1 TABLET BY MOUTH AT BEDTIME Patient taking differently: Take 15 mg by mouth at bedtime.  04/06/18  Yes Wardell Honour, MD  predniSONE (DELTASONE) 5 MG tablet Take 5 mg by mouth daily as needed. Only takes with RA Flare.   Yes [provider]  rosuvastatin (CRESTOR) 10 MG tablet Take 1 tablet (10 mg total) by mouth daily. 01/09/18  Yes Wardell Honour, MD  traZODone (DESYREL) 100 MG tablet TAKE 2 TABLETS BY  MOUTH EVERY DAY AT BEDTIME Patient taking differently: Take 200 mg by mouth at bedtime.  06/01/18  Yes Wardell Honour, MD  Blood Glucose Monitoring Suppl (BLOOD GLUCOSE METER KIT AND SUPPLIES) KIT Dispense based on patient and insurance preference. Use up to four times daily as directed. (FOR ICD-9 250.00, 250.01). 07/19/14   Gale Journey, Judson Roch L, PA-C  glucose blood test strip Check sugar three times daily  Dx: DMII insulin dependent with retinopathy, neuropathy controlled 05/03/17   Wardell Honour, MD  Insulin Syringes, Disposable, U-100 0.5 ML MISC 28 Units by Does not apply route 2 (two) times daily. 10/30/14   Wardell Honour, MD  Needles & Syringes MISC 1 Syringe by Does not apply route 2 (two) times daily. 12/29/14   Wardell Honour, MD  Flossie Buffy INSULIN SYRINGE 31G X 5/16" 0.5 ML MISC USE AS DIRECTED TO INJECT INSULIN 2 TIMES DAILY 05/01/18   Wardell Honour, MD    Allergies    Codeine, Contrast media [iodinated diagnostic agents], Nitrofurantoin monohyd macro, Betadine [povidone iodine], Folic acid, Gabapentin, Iodine, Lyrica [pregabalin], Red dye, and Ultram [tramadol hcl]  Review of Systems   Review of Systems 10 Systems reviewed and are negative for acute change except as noted in the HPI.  Physical Exam Updated Vital Signs BP (!) 151/73   Pulse 90   Temp 98.2 F (36.8 C) (Oral)   Resp 18   SpO2 99%   Physical Exam Constitutional:      Comments: Alert nontoxic.  Alert nontoxic clinically well in appearance.  HENT:     Head: Normocephalic and atraumatic.  Eyes:     Extraocular Movements: Extraocular movements intact.  Cardiovascular:     Rate and Rhythm: Normal  rate and regular rhythm.  Pulmonary:     Effort: Pulmonary effort is normal.     Breath sounds: Normal breath sounds.  Abdominal:     Palpations: Abdomen is soft.     Comments: Abdomen soft.  Patient has a very faint, yellow-brown area of bruising in the right upper abdominal wall.  This is smooth without any palpable  hematoma.  Patient has exquisite tenderness to palpation over the skin surfaces of the right upper abdomen.  Skin however is normal.  There are no rashes.  No vesicles.  No erythema.  Musculoskeletal:        General: No swelling or tenderness. Normal range of motion.     Right lower leg: No edema.     Left lower leg: No edema.  Skin:    General: Skin is warm and dry.  Neurological:     General: No focal deficit present.     Mental Status: She is oriented to person, place, and time.     Coordination: Coordination normal.  Psychiatric:        Mood and Affect: Mood normal.     ED Results / Procedures / Treatments   Labs (all labs ordered are listed, but only abnormal results are displayed) Labs Reviewed  COMPREHENSIVE METABOLIC PANEL - Abnormal; Notable for the following components:      Result Value   Glucose, Bld 182 (*)    Creatinine, Ser 1.47 (*)    GFR calc non Af Amer 36 (*)    GFR calc Af Amer 42 (*)    All other components within normal limits  CBC - Abnormal; Notable for the following components:   RBC 3.19 (*)    Hemoglobin 10.1 (*)    HCT 31.8 (*)    All other components within normal limits  LIPASE, BLOOD  URINALYSIS, ROUTINE W REFLEX MICROSCOPIC    EKG None  Radiology CT Abdomen Pelvis Wo Contrast  Result Date: 02/21/2020 CLINICAL DATA:  69 year old female with right lower quadrant abdominal pain. EXAM: CT ABDOMEN AND PELVIS WITHOUT CONTRAST TECHNIQUE: Multidetector CT imaging of the abdomen and pelvis was performed following the standard protocol without IV contrast. COMPARISON:  None. FINDINGS: Evaluation of this exam is limited in the absence of intravenous contrast. Lower chest: The visualized lung bases are clear. There is calcification of the mitral annulus. No intra-abdominal free air or free fluid. Hepatobiliary: The liver is unremarkable. No intrahepatic biliary ductal dilatation. Cholecystectomy. No 10 calcified stone noted in the central CBD. Pancreas:  Unremarkable. No pancreatic ductal dilatation or surrounding inflammatory changes. Spleen: Normal in size without focal abnormality. Adrenals/Urinary Tract: The adrenal glands are unremarkable. There is no hydronephrosis or nephrolithiasis on either side. The visualized ureters and urinary bladder appear unremarkable. Stomach/Bowel: There is moderate stool throughout the colon. There is no bowel obstruction or active inflammation. The appendix is normal. Vascular/Lymphatic: Moderate aortoiliac atherosclerotic disease. The IVC is unremarkable. No portal venous gas. There is no adenopathy. Reproductive: Hysterectomy. There is several pockets of air within the pelvis superior to the vaginal cuff. These may be within the upper portion of the vaginal cuff, although dehiscence of the vaginal cuff is not excluded. Clinical correlation is recommended. No fluid collection. There is a septated cyst or 2 adjacent cysts in the left ovary with combined length of 3.5 cm. Further evaluation with pelvic ultrasound on a nonemergent basis recommended. Other: None Musculoskeletal: Degenerative changes of the spine. No acute osseous pathology. IMPRESSION: 1. Several pockets of air within  the pelvis superior to the vaginal cuff. These may be within the upper portion of the vaginal cuff, although dehiscence of the vaginal cuff is not excluded. Clinical correlation is recommended. No fluid collection. 2. Septated left ovarian cysts. Further evaluation with pelvic ultrasound on a nonemergent/outpatient basis recommended. 3. No hydronephrosis or nephrolithiasis. 4. No bowel obstruction. Normal appendix. 5. Aortic Atherosclerosis (ICD10-I70.0). Electronically Signed   By: Anner Crete M.D.   On: 02/21/2020 21:56    Procedures Procedures (including critical care time)  Medications Ordered in ED Medications  sodium chloride flush (NS) 0.9 % injection 3 mL (0 mLs Intravenous Hold 02/21/20 2003)  HYDROmorphone (DILAUDID) injection 1  mg (1 mg Intravenous Refused 02/21/20 2158)    ED Course  I have reviewed the triage vital signs and the nursing notes.  Pertinent labs & imaging results that were available during my care of the patient were reviewed by me and considered in my medical decision making (see chart for details).    MDM Rules/Calculators/A&P                      CT scan obtained for right upper quadrant pain.  Patient had concern for possible deeper abscess or infection.  Clinically none was present.  CT does not reveal any deeper inflammatory changes.  Patient has a very faint bruise from prior insulin injection.  No palpable calcification or hematoma.  Patient may have injected close to or on a nerve causing a neuralgia type pain.  We also discussed the possibility of early shingles.  Pain quality and severity is suggestive of shingles but there are absolutely no skin findings at this time.  Patient will return if any new or evolving symptoms develop. Final Clinical Impression(s) / ED Diagnoses Final diagnoses:  Right upper quadrant pain    Rx / DC Orders ED Discharge Orders    None       Charlesetta Shanks, MD 02/21/20 2346

## 2020-02-21 NOTE — ED Triage Notes (Signed)
Pt presents with pain on insulin injection site on right abdominal area from an insulin injection a week ago; pt has Hx of having staph after a bad injection of insulin a few years ago.

## 2020-02-21 NOTE — Discharge Instructions (Signed)
Go to the ER for evaluation

## 2020-02-27 DIAGNOSIS — M0609 Rheumatoid arthritis without rheumatoid factor, multiple sites: Secondary | ICD-10-CM | POA: Diagnosis not present

## 2020-02-29 DIAGNOSIS — D485 Neoplasm of uncertain behavior of skin: Secondary | ICD-10-CM | POA: Diagnosis not present

## 2020-02-29 DIAGNOSIS — D1801 Hemangioma of skin and subcutaneous tissue: Secondary | ICD-10-CM | POA: Diagnosis not present

## 2020-02-29 DIAGNOSIS — L821 Other seborrheic keratosis: Secondary | ICD-10-CM | POA: Diagnosis not present

## 2020-02-29 DIAGNOSIS — L905 Scar conditions and fibrosis of skin: Secondary | ICD-10-CM | POA: Diagnosis not present

## 2020-02-29 DIAGNOSIS — Z85828 Personal history of other malignant neoplasm of skin: Secondary | ICD-10-CM | POA: Diagnosis not present

## 2020-02-29 DIAGNOSIS — L820 Inflamed seborrheic keratosis: Secondary | ICD-10-CM | POA: Diagnosis not present

## 2020-02-29 DIAGNOSIS — D225 Melanocytic nevi of trunk: Secondary | ICD-10-CM | POA: Diagnosis not present

## 2020-04-09 DIAGNOSIS — M0609 Rheumatoid arthritis without rheumatoid factor, multiple sites: Secondary | ICD-10-CM | POA: Diagnosis not present

## 2020-05-13 DIAGNOSIS — G894 Chronic pain syndrome: Secondary | ICD-10-CM | POA: Diagnosis not present

## 2020-05-13 DIAGNOSIS — E1121 Type 2 diabetes mellitus with diabetic nephropathy: Secondary | ICD-10-CM | POA: Diagnosis not present

## 2020-05-13 DIAGNOSIS — R0781 Pleurodynia: Secondary | ICD-10-CM | POA: Diagnosis not present

## 2020-05-13 DIAGNOSIS — Z794 Long term (current) use of insulin: Secondary | ICD-10-CM | POA: Diagnosis not present

## 2020-05-13 DIAGNOSIS — M5136 Other intervertebral disc degeneration, lumbar region: Secondary | ICD-10-CM | POA: Diagnosis not present

## 2020-05-13 DIAGNOSIS — M792 Neuralgia and neuritis, unspecified: Secondary | ICD-10-CM | POA: Diagnosis not present

## 2020-05-13 DIAGNOSIS — L659 Nonscarring hair loss, unspecified: Secondary | ICD-10-CM | POA: Diagnosis not present

## 2020-05-19 ENCOUNTER — Other Ambulatory Visit: Payer: Self-pay | Admitting: Family Medicine

## 2020-05-19 DIAGNOSIS — M792 Neuralgia and neuritis, unspecified: Secondary | ICD-10-CM

## 2020-05-21 DIAGNOSIS — M0609 Rheumatoid arthritis without rheumatoid factor, multiple sites: Secondary | ICD-10-CM | POA: Diagnosis not present

## 2020-06-03 ENCOUNTER — Other Ambulatory Visit: Payer: Medicare Other

## 2020-06-12 DIAGNOSIS — M25531 Pain in right wrist: Secondary | ICD-10-CM | POA: Diagnosis not present

## 2020-06-12 DIAGNOSIS — M109 Gout, unspecified: Secondary | ICD-10-CM | POA: Diagnosis not present

## 2020-06-12 DIAGNOSIS — Z6837 Body mass index (BMI) 37.0-37.9, adult: Secondary | ICD-10-CM | POA: Diagnosis not present

## 2020-06-12 DIAGNOSIS — M2550 Pain in unspecified joint: Secondary | ICD-10-CM | POA: Diagnosis not present

## 2020-06-12 DIAGNOSIS — Z79899 Other long term (current) drug therapy: Secondary | ICD-10-CM | POA: Diagnosis not present

## 2020-06-12 DIAGNOSIS — R682 Dry mouth, unspecified: Secondary | ICD-10-CM | POA: Diagnosis not present

## 2020-06-12 DIAGNOSIS — E669 Obesity, unspecified: Secondary | ICD-10-CM | POA: Diagnosis not present

## 2020-06-12 DIAGNOSIS — M0609 Rheumatoid arthritis without rheumatoid factor, multiple sites: Secondary | ICD-10-CM | POA: Diagnosis not present

## 2020-06-12 DIAGNOSIS — M25551 Pain in right hip: Secondary | ICD-10-CM | POA: Diagnosis not present

## 2020-06-12 DIAGNOSIS — M5136 Other intervertebral disc degeneration, lumbar region: Secondary | ICD-10-CM | POA: Diagnosis not present

## 2020-06-23 ENCOUNTER — Emergency Department (HOSPITAL_COMMUNITY)
Admission: EM | Admit: 2020-06-23 | Discharge: 2020-06-23 | Disposition: A | Discharge status: Home / Self Care | Payer: Medicare Other | Attending: Emergency Medicine | Admitting: Emergency Medicine

## 2020-06-23 ENCOUNTER — Emergency Department (HOSPITAL_COMMUNITY): Payer: Medicare Other

## 2020-06-23 ENCOUNTER — Encounter (HOSPITAL_COMMUNITY): Payer: Self-pay | Admitting: Emergency Medicine

## 2020-06-23 ENCOUNTER — Other Ambulatory Visit: Payer: Self-pay

## 2020-06-23 DIAGNOSIS — R0789 Other chest pain: Secondary | ICD-10-CM | POA: Diagnosis not present

## 2020-06-23 DIAGNOSIS — R42 Dizziness and giddiness: Secondary | ICD-10-CM | POA: Diagnosis not present

## 2020-06-23 DIAGNOSIS — I959 Hypotension, unspecified: Secondary | ICD-10-CM | POA: Diagnosis not present

## 2020-06-23 DIAGNOSIS — Z794 Long term (current) use of insulin: Secondary | ICD-10-CM | POA: Insufficient documentation

## 2020-06-23 DIAGNOSIS — K5900 Constipation, unspecified: Secondary | ICD-10-CM | POA: Diagnosis not present

## 2020-06-23 DIAGNOSIS — N183 Chronic kidney disease, stage 3 unspecified: Secondary | ICD-10-CM | POA: Diagnosis not present

## 2020-06-23 DIAGNOSIS — R0602 Shortness of breath: Secondary | ICD-10-CM | POA: Diagnosis not present

## 2020-06-23 DIAGNOSIS — E1140 Type 2 diabetes mellitus with diabetic neuropathy, unspecified: Secondary | ICD-10-CM | POA: Insufficient documentation

## 2020-06-23 DIAGNOSIS — R600 Localized edema: Secondary | ICD-10-CM | POA: Diagnosis not present

## 2020-06-23 DIAGNOSIS — Z79899 Other long term (current) drug therapy: Secondary | ICD-10-CM | POA: Diagnosis not present

## 2020-06-23 DIAGNOSIS — R079 Chest pain, unspecified: Secondary | ICD-10-CM | POA: Diagnosis not present

## 2020-06-23 DIAGNOSIS — Z20822 Contact with and (suspected) exposure to covid-19: Secondary | ICD-10-CM | POA: Diagnosis not present

## 2020-06-23 DIAGNOSIS — E11319 Type 2 diabetes mellitus with unspecified diabetic retinopathy without macular edema: Secondary | ICD-10-CM | POA: Diagnosis not present

## 2020-06-23 DIAGNOSIS — Z87891 Personal history of nicotine dependence: Secondary | ICD-10-CM | POA: Diagnosis not present

## 2020-06-23 DIAGNOSIS — I10 Essential (primary) hypertension: Secondary | ICD-10-CM | POA: Diagnosis not present

## 2020-06-23 DIAGNOSIS — I129 Hypertensive chronic kidney disease with stage 1 through stage 4 chronic kidney disease, or unspecified chronic kidney disease: Secondary | ICD-10-CM | POA: Insufficient documentation

## 2020-06-23 DIAGNOSIS — Z7982 Long term (current) use of aspirin: Secondary | ICD-10-CM | POA: Diagnosis not present

## 2020-06-23 DIAGNOSIS — R531 Weakness: Secondary | ICD-10-CM | POA: Diagnosis not present

## 2020-06-23 LAB — CBC
HCT: 35.8 % — ABNORMAL LOW (ref 36.0–46.0)
Hemoglobin: 11.5 g/dL — ABNORMAL LOW (ref 12.0–15.0)
MCH: 31.3 pg (ref 26.0–34.0)
MCHC: 32.1 g/dL (ref 30.0–36.0)
MCV: 97.3 fL (ref 80.0–100.0)
Platelets: 280 10*3/uL (ref 150–400)
RBC: 3.68 MIL/uL — ABNORMAL LOW (ref 3.87–5.11)
RDW: 14.4 % (ref 11.5–15.5)
WBC: 3.9 10*3/uL — ABNORMAL LOW (ref 4.0–10.5)
nRBC: 0 % (ref 0.0–0.2)

## 2020-06-23 LAB — BASIC METABOLIC PANEL
Anion gap: 14 (ref 5–15)
BUN: 14 mg/dL (ref 8–23)
CO2: 28 mmol/L (ref 22–32)
Calcium: 9.7 mg/dL (ref 8.9–10.3)
Chloride: 96 mmol/L — ABNORMAL LOW (ref 98–111)
Creatinine, Ser: 1.63 mg/dL — ABNORMAL HIGH (ref 0.44–1.00)
GFR calc Af Amer: 37 mL/min — ABNORMAL LOW (ref >60)
GFR calc non Af Amer: 32 mL/min — ABNORMAL LOW (ref >60)
Glucose, Bld: 230 mg/dL — ABNORMAL HIGH (ref 70–99)
Potassium: 3.7 mmol/L (ref 3.5–5.1)
Sodium: 138 mmol/L (ref 135–145)

## 2020-06-23 LAB — TROPONIN I (HIGH SENSITIVITY)
Troponin I (High Sensitivity): 8 ng/L (ref <18)
Troponin I (High Sensitivity): 7 ng/L (ref <18)

## 2020-06-23 LAB — SARS CORONAVIRUS 2 BY RT PCR (HOSPITAL ORDER, PERFORMED IN ~~LOC~~ HOSPITAL LAB): SARS Coronavirus 2: NEGATIVE

## 2020-06-23 LAB — PROTIME-INR
INR: 0.9 (ref 0.8–1.2)
Prothrombin Time: 12.2 seconds (ref 11.4–15.2)

## 2020-06-23 LAB — CBG MONITORING, ED: Glucose-Capillary: 182 mg/dL — ABNORMAL HIGH (ref 70–99)

## 2020-06-23 NOTE — ED Triage Notes (Signed)
Patient arrived with EMS from home woke up at 1am with central chest pressure , mild SOB , nausea and lower legs cramps , no cough or fever . Denies emesis or diaphoresis .

## 2020-06-23 NOTE — ED Notes (Signed)
Patient verbalizes understanding of discharge instructions. Opportunity for questioning and answers were provided. Armband removed by staff, pt discharged from ED ambulatory.   

## 2020-06-23 NOTE — ED Provider Notes (Signed)
[facility] EMERGENCY DEPARTMENT Provider Note   CSN: [id number] Arrival date & time: 06/23/20  0762     History Chief Complaint  Patient presents with  . Chest Pain    Angel French is a 69 y.o. female with past medical history significant for CKD stage III, type 2 diabetes, hyperlipidemia, hypertension, IBS, OA, RA who is presenting with sudden onset chest pain that woke her from sleep 0045 on 823/21.  Patient reports that she had chest pressure and difficulty breathing when she woke up.  She reported tightness around the anterior and posterior aspect of her torso.  At first, she thought it was GERD and tried to toleratedat home.  Eventually, around 0600, she called the ambulance.  Patient reports that the pain immediately subsided with 1 dose of nitro.  Patient denies any other recent episodes of angina, dyspnea on exertion.  She reports that she had a CABG a few years ago that came back completely normal.  No history of MI, but does have TIA on her past medical history.  Patient does note recent weight loss from 222 to 175 lbs within the past few months.  She reports she has not been trying to lose weight.  Patient also reports some dizziness and leg weakness when trying to stand up.      Past Medical History:  Diagnosis Date  . Allergy    generic allergy pill; Spring and Fall only.  . Anxiety   . Arthritis    DDD lumbar, R hip OA.  s/p ortho consult in past.  . Blood transfusion without reported diagnosis    Mountain climbing accident in [place].  . Brachial plexus disorders   . Cataract    B retractions.  . Chronic kidney disease    stage 3 per pt.   . Chronic pain syndrome   . Chronic renal insufficiency, stage 3 (moderate)   . Constipation   . DDD (degenerative disc disease), lumbar   . Depression   . Diabetes mellitus   . Diabetic peripheral neuropathy associated with type 2 diabetes mellitus ([place])   . Diabetic retinopathy ([place])   . Diabetic  retinopathy associated with type 2 diabetes mellitus ([place])    s/p laser treatment multiple.  Unable to drive.  . Fatty liver   . Fibromyalgia   . Food allergy   . GERD (gastroesophageal reflux disease)   . Hypercholesteremia   . Hyperlipidemia   . Hypertension    controlled, off meds   . IBS (irritable bowel syndrome)   . Leg edema   . Neuromuscular disorder ([place])   . OSA (obstructive sleep apnea)   . Osteoarthritis   . Rheumatic fever   . Rheumatoid arthritis ([place])   . Stomach ulcer   . Swallowing difficulty   . TIA (transient ischemic attack)   . Ulcer    Peptic ulcer H. Pylori + s/p treatment.  Upper GI diagnosed.[name] Prilosec PRN .    Patient Active Problem List   Diagnosis Date Noted  . Vitamin D deficiency 01/03/2019  . Visual changes 04/11/2018  . Paresthesia 04/11/2018  . OSA (obstructive sleep apnea) 09/09/2017  . Nocturnal hypoxemia due to obesity 09/09/2017  . Type 2 diabetes mellitus with diabetic polyneuropathy, with long-term current use of insulin ([place]) 09/09/2017  . Hypersomnia with sleep apnea 07/21/2017  . Elevated blood uric acid level 01/25/2017  . Plantar fasciitis of left foot 01/25/2017  . Venous stasis 08/23/2016  . Rheumatoid arthritis involving  both hands with positive rheumatoid factor ([place]) 10/30/2015  . Chronic pain syndrome 01/22/2015  . Chronic renal insufficiency 01/22/2015  . Pure hypercholesterolemia 01/01/2013  . Essential hypertension, benign 01/01/2013  . Degenerative disc disease, lumbar 01/01/2013  . Depression 01/01/2013  . Osteoarthritis of right hip 01/01/2013  . Diabetic peripheral neuropathy associated with type 2 diabetes mellitus ([place]) 01/01/2013  . Diabetic retinopathy ([place]) 01/01/2013  . Obesity 01/01/2013    Past Surgical History:  Procedure Laterality Date  .  2 SPINAL INJECTIONS     . ABDOMINAL HYSTERECTOMY  11/02/1979   DUB; cervical dysplasia; ovaries intact.  . ABDOMINAL SURGERY     staph abcess   .  Behavior[facility] Admission     age 49; three months in [place].  Marland Kitchen BREAST BIOPSY    . CARDIAC CATHETERIZATION  11/02/2007   normal coronary arteries.  . CARPAL TUNNEL RELEASE     Bilateral.  . CATARACT EXTRACTION, BILATERAL    . CHOLECYSTECTOMY    . ESOPHAGEAL MANOMETRY N/A 09/14/2017   Procedure: ESOPHAGEAL MANOMETRY (EM);  Surgeon: Ronnette Juniper, MD;  Location: [facility] ENDOSCOPY;  Service: Gastroenterology;  Laterality: N/A;  . EYE SURGERY     Cataracts B. Laser surgery x 7 for Diabetic Retinopathy  . TONSILLECTOMY       OB History    Gravida  1   Para      Term      Preterm      AB      Living  1     SAB      TAB      Ectopic      Multiple      Live Births              Family History  Adopted: Yes  Problem Relation Age of Onset  . Breast cancer Daughter     Social History   Tobacco Use  . Smoking status: Former Research scientist (life sciences)  . Smokeless tobacco: Never Used  . Tobacco comment: Quit 1987  Substance Use Topics  . Alcohol use: No    Alcohol/week: 0.0 standard drinks  . Drug use: No    Home Medications Prior to Admission medications   Medication Sig Start Date End Date Taking? Authorizing Provider  acetaminophen (TYLENOL) 325 MG tablet Take 650 mg by mouth every 6 (six) hours as needed (every 6 weeks before RA infusion).    [provider]  allopurinol (ZYLOPRIM) 100 MG tablet Take 1 tablet (100 mg total) by mouth daily. Ov needed Patient taking differently: Take 200 mg by mouth daily.  06/28/17   Wardell Honour, MD  aspirin 325 MG tablet Take 325 mg by mouth daily.     [provider]  Blood Glucose Monitoring Suppl (BLOOD GLUCOSE METER KIT AND SUPPLIES) KIT Dispense based on patient and insurance preference. Use up to four times daily as directed. (FOR ICD-9 250.00, 250.01). 07/19/14   Weber, Damaris Hippo, PA-C  Cholecalciferol (VITAMIN D3) 5000 units CAPS Take 1 capsule by mouth daily.    [provider]  diclofenac sodium (VOLTAREN)  1 % GEL Apply 2 g topically 4 (four) times daily. Patient taking differently: Apply 2 g topically 2 (two) times daily as needed (pain).  11/05/14   Wardell Honour, MD  diphenhydrAMINE (BENADRYL) 25 MG tablet Take 25 mg by mouth every 6 (six) hours as needed (every 6 weeks prior to RA infusion).    [provider]  DULoxetine (CYMBALTA) 30 MG  capsule TAKE 1 CAPSULE BY MOUTH EVERY DAY Patient taking differently: Take 60 mg by mouth daily.  04/19/18   Wardell Honour, MD  escitalopram (LEXAPRO) 20 MG tablet TAKE 1 TABLET BY MOUTH EVERY DAY Patient taking differently: Take 10 mg by mouth daily.  03/28/18   Wardell Honour, MD  famotidine (PEPCID) 40 MG tablet Take 40 mg by mouth at bedtime. 01/20/20   [provider]  furosemide (LASIX) 20 MG tablet Take 1-3 tablets (20-60 mg total) by mouth daily. 04/12/18   Wardell Honour, MD  glucose blood test strip Check sugar three times daily  Dx: DMII insulin dependent with retinopathy, neuropathy controlled 05/03/17   Wardell Honour, MD  HYDROcodone-acetaminophen [facility]) 10-325 MG tablet Take 1 tablet by mouth every 12 (twelve) hours as needed. Patient taking differently: Take 1 tablet by mouth every 8 (eight) hours as needed for moderate pain.  04/12/18   Wardell Honour, MD  insulin aspart (NOVOLOG) 100 UNIT/ML injection Inject 25 Units into the skin 3 (three) times daily with meals. Patient taking differently: Inject 30 Units into the skin 3 (three) times daily with meals.  01/03/19   [place]ire, Joneen Boers, FNP  Insulin Syringes, Disposable, U-100 0.5 ML MISC 28 Units by Does not apply route 2 (two) times daily. 10/30/14   Wardell Honour, MD  leflunomide (ARAVA) 20 MG tablet Take 20 mg by mouth daily.  12/10/16   [provider]  LEVEMIR 100 UNIT/ML injection INJECT 60 UNITS AT BEDTIME AS DIRECTED Patient taking differently: Inject 70 Units into the skin at bedtime.  05/31/18   Wardell Honour, MD  linaclotide Rolan Lipa) 290 MCG CAPS capsule Take  290 mcg by mouth daily before breakfast.     [provider]  Needles & Syringes MISC 1 Syringe by Does not apply route 2 (two) times daily. 12/29/14   Wardell Honour, MD  omeprazole (PRILOSEC) 20 MG capsule TAKE 1 CAPSULE BY MOUTH EVERY DAY Patient taking differently: Take 20 mg by mouth daily.  02/23/18   Wardell Honour, MD  oxybutynin (DITROPAN XL) 15 MG 24 hr tablet TAKE 1 TABLET BY MOUTH AT BEDTIME Patient taking differently: Take 15 mg by mouth at bedtime.  04/06/18   Wardell Honour, MD  predniSONE (DELTASONE) 5 MG tablet Take 5 mg by mouth daily as needed. Only takes with RA Flare.    [provider]  rosuvastatin (CRESTOR) 10 MG tablet Take 1 tablet (10 mg total) by mouth daily. 01/09/18   Wardell Honour, MD  traZODone (DESYREL) 100 MG tablet TAKE 2 TABLETS BY MOUTH EVERY DAY AT BEDTIME Patient taking differently: Take 200 mg by mouth at bedtime.  06/01/18   Wardell Honour, MD  Flossie Buffy INSULIN SYRINGE 31G X 5/16" 0.5 ML MISC USE AS DIRECTED TO INJECT INSULIN 2 TIMES DAILY 05/01/18   Wardell Honour, MD    Allergies    Codeine, Contrast media [iodinated diagnostic agents], Nitrofurantoin monohyd macro, Betadine [povidone iodine], Folic acid, Gabapentin, Iodine, Lyrica [pregabalin], Red dye, and Ultram [tramadol hcl]  Review of Systems   Review of Systems  Constitutional: Positive for unexpected weight change.  HENT: Negative for congestion, rhinorrhea, sinus pressure, sinus pain and sore throat.   Eyes: Negative.   Respiratory: Positive for chest tightness and shortness of breath. Negative for cough and wheezing.   Cardiovascular: Positive for chest pain. Negative for palpitations and leg swelling.  Gastrointestinal: Positive for constipation. Negative for abdominal distention,  abdominal pain, diarrhea, nausea and vomiting.  Endocrine: Negative for polyphagia.  Genitourinary: Negative for dysuria.  Musculoskeletal: Negative for arthralgias, myalgias and neck stiffness.   Allergic/Immunologic: Negative.   Neurological: Positive for dizziness and weakness. Negative for syncope, speech difficulty, light-headedness and numbness.  Hematological: Negative.   Psychiatric/Behavioral: Negative.     Physical Exam Updated Vital Signs BP 109/71 (BP Location: Right Arm)   Pulse 99   Temp 98.8 F (37.1 C) (Oral)   Resp 12   Ht _0  (1.676 m)   Wt 85 kg   SpO2 98%   BMI 30.25 kg/m   Physical Exam Constitutional:      General: She is not in acute distress.    Appearance: She is well-developed. She is obese. She is not ill-appearing, toxic-appearing or diaphoretic.  HENT:     Head: Normocephalic and atraumatic.  Eyes:     Extraocular Movements: Extraocular movements intact.     Pupils: Pupils are equal, round, and reactive to light.  Neck:     Vascular: No JVD.  Cardiovascular:     Rate and Rhythm: Normal rate and regular rhythm.     Heart sounds: Gallop present. S4 sounds present.   Pulmonary:     Effort: Pulmonary effort is normal. No tachypnea or accessory muscle usage.     Breath sounds: Normal breath sounds.  Chest:     Chest wall: No mass, tenderness, crepitus or edema.  Abdominal:     General: Bowel sounds are normal.     Palpations: Abdomen is soft.  Musculoskeletal:        General: Normal range of motion.     Cervical back: Normal range of motion and neck supple.     Right lower leg: Edema present.     Left lower leg: No edema.  Lymphadenopathy:     Cervical: No cervical adenopathy.  Skin:    General: Skin is warm.     Capillary Refill: Capillary refill takes less than 2 seconds.  Neurological:     General: No focal deficit present.     Mental Status: She is alert and oriented to person, place, and time.  Psychiatric:        Mood and Affect: Mood normal.        Behavior: Behavior normal.     ED Results / Procedures / Treatments   Labs (all labs ordered are listed, but only abnormal results are displayed) Labs Reviewed  BASIC  METABOLIC PANEL - Abnormal; Notable for the following components:      Result Value   Chloride 96 (*)    Glucose, Bld 230 (*)    Creatinine, Ser 1.63 (*)    GFR calc non Af Amer 32 (*)    GFR calc Af Amer 37 (*)    All other components within normal limits  CBC - Abnormal; Notable for the following components:   WBC 3.9 (*)    RBC 3.68 (*)    Hemoglobin 11.5 (*)    HCT 35.8 (*)    All other components within normal limits  CBG MONITORING, ED - Abnormal; Notable for the following components:   Glucose-Capillary 182 (*)    All other components within normal limits  SARS CORONAVIRUS 2 BY RT PCR (HOSPITAL ORDER, [facility])  PROTIME-INR  TROPONIN I (HIGH SENSITIVITY)  TROPONIN I (HIGH SENSITIVITY)   EKG EKG Interpretation  Date/Time:  Monday June 23 2020 06:58:09 EDT Ventricular Rate:  106 PR Interval:  180 QRS Duration: 68 QT Interval:  346 QTC Calculation: 459 R Axis:   -47 Text Interpretation: Sinus tachycardia with Fusion complexes Left anterior fascicular block Cannot rule out Anterior infarct , age undetermined No significant change since last tracing Confirmed by Blanchie Dessert [id number]) on 06/23/2020 4:57:49 PM   Radiology DG Chest 2 View  Result Date: 06/23/2020 CLINICAL DATA:  Shortness of breath. Dizziness. Weakness. Chest pressure. EXAM: CHEST - 2 VIEW COMPARISON:  None. FINDINGS: The heart size and mediastinal contours are within normal limits. Both lungs are clear. The visualized skeletal structures are unremarkable. IMPRESSION: No active cardiopulmonary disease. Electronically Signed   By: Marlaine Hind M.D.   On: 06/23/2020 07:43    Procedures Procedures (including critical care time)  Medications Ordered in ED Medications - No data to display  ED Course  I have reviewed the triage vital signs and the nursing notes.  Pertinent labs & imaging results that were available during my care of the patient were reviewed by me and  considered in my medical decision making (see chart for details).    MDM Rules/Calculators/A&P                          69 yo female with pmhx s/f CKD III, DMII, HTN, HLD, hx of TIA presents with chest tightness that woke her from sleep at 0045 this morning. Patient reports continued pain for several hours until finally calling an ambulance. She reports that pain was immediately relieved by nitro from EMS.  On arrival, she was afebrile with mild hypertension. She appears well on physical exam with out any cardiac findings. Her troponins were 8 and 7 at 0700 and 0900 this morning respectively. Her EKG is unchanged.  Low concern for ACS though patient does have risk factors. However, given patient has not had any repeat of pain and improvement of pain with ACS, can also consider esophageal spasm. Recommend that patient follow-up with cardiology given her risk factors. Patient also noted to have decreased appetite and generalized malaise yesterday.  Will obtain COVID testing with leukopenia and low grade fevers.   Patient provided with return precautions. Follow up with PCP and cardiologist.     Final Clinical Impression(s) / ED Diagnoses Final diagnoses:  Atypical chest pain    Rx / DC Orders ED Discharge Orders    None       Wilber Oliphant, MD 06/23/20 Velta Addison    Blanchie Dessert, MD 06/23/20 [phone]

## 2020-06-23 NOTE — Discharge Instructions (Addendum)
All of your tests came back reassuring today. We would like you to follow up with a cardiologist.  Because your pain was relieved with nitroglycerin your symptoms could have due to an esophageal spasm. We have provided Dr. Hassell Done  information in this packet.  We will obtain a Covid test before you leave today.  We will call you with the results later this evening  If you experience any repeat chest pain, difficulty breathing, shortness of breath, radiation of chest pain to shoulder and jaw, please come straight back to the emergency department.

## 2020-06-24 ENCOUNTER — Telehealth: Payer: Self-pay | Admitting: General Practice

## 2020-06-24 NOTE — Telephone Encounter (Signed)
    Pt is calling to make an appt. She was discharge from ED. Pt said she will call insurance first to check who's in network with her insurance

## 2020-06-29 ENCOUNTER — Other Ambulatory Visit: Payer: Self-pay

## 2020-06-29 ENCOUNTER — Encounter (HOSPITAL_COMMUNITY): Payer: Self-pay

## 2020-06-29 ENCOUNTER — Ambulatory Visit (INDEPENDENT_AMBULATORY_CARE_PROVIDER_SITE_OTHER): Payer: Medicare Other

## 2020-06-29 ENCOUNTER — Ambulatory Visit (HOSPITAL_COMMUNITY)
Admission: EM | Admit: 2020-06-29 | Discharge: 2020-06-29 | Disposition: A | Discharge status: Home / Self Care | Payer: Medicare Other | Attending: Urgent Care | Admitting: Urgent Care

## 2020-06-29 DIAGNOSIS — W19XXXA Unspecified fall, initial encounter: Secondary | ICD-10-CM

## 2020-06-29 DIAGNOSIS — M25562 Pain in left knee: Secondary | ICD-10-CM

## 2020-06-29 DIAGNOSIS — R1032 Left lower quadrant pain: Secondary | ICD-10-CM | POA: Diagnosis not present

## 2020-06-29 DIAGNOSIS — M79605 Pain in left leg: Secondary | ICD-10-CM | POA: Diagnosis not present

## 2020-06-29 DIAGNOSIS — M25552 Pain in left hip: Secondary | ICD-10-CM | POA: Diagnosis not present

## 2020-06-29 DIAGNOSIS — M47816 Spondylosis without myelopathy or radiculopathy, lumbar region: Secondary | ICD-10-CM | POA: Diagnosis not present

## 2020-06-29 DIAGNOSIS — Z043 Encounter for examination and observation following other accident: Secondary | ICD-10-CM | POA: Diagnosis not present

## 2020-06-29 DIAGNOSIS — M79652 Pain in left thigh: Secondary | ICD-10-CM

## 2020-06-29 DIAGNOSIS — M25572 Pain in left ankle and joints of left foot: Secondary | ICD-10-CM

## 2020-06-29 NOTE — ED Notes (Signed)
Pt provided with ice pack.

## 2020-06-29 NOTE — ED Provider Notes (Signed)
[facility]   MRN: [id number] DOB: [birth year]  Subjective:   Angel French is a 69 y.o. female presenting for 2-day history of persistent left hip/thigh pain, left knee pain, left leg pain.  Patient states that she suffered an accidental fall on her steps, states that she went down about a total of 14 steps.  Patient states that the fall was precipitated by some dizziness which is not new for her.  She did get recent work-up on 06/23/2020 for ACS which was negative but still was diagnosed with angina and has a cardiology consult pending.  Denies any active dizziness or chest pain, headache, confusion, denies loss of consciousness from the fall.  Patient was able to ambulate immediately after the fall, states that she went and got a drink and has been trying to rest and nurse herself at home.  No current facility-administered medications for this encounter.  Current Outpatient Medications:  .  acetaminophen (TYLENOL) 325 MG tablet, Take 650 mg by mouth every 6 (six) hours as needed (every 6 weeks before RA infusion)., Disp: , Rfl:  .  aspirin 325 MG tablet, Take 325 mg by mouth daily. , Disp: , Rfl:  .  Blood Glucose Monitoring Suppl (BLOOD GLUCOSE METER KIT AND SUPPLIES) KIT, Dispense based on patient and insurance preference. Use up to four times daily as directed. (FOR ICD-9 250.00, 250.01)., Disp: 1 each, Rfl: 11 .  diclofenac sodium (VOLTAREN) 1 % GEL, Apply 2 g topically 4 (four) times daily. (Patient taking differently: Apply 2 g topically 2 (two) times daily as needed (pain). ), Disp: 100 g, Rfl: 3 .  diphenhydrAMINE (BENADRYL) 25 MG tablet, Take 25 mg by mouth every 6 (six) hours as needed (every 6 weeks prior to RA infusion)., Disp: , Rfl:  .  DULoxetine (CYMBALTA) 30 MG capsule, TAKE 1 CAPSULE BY MOUTH EVERY DAY (Patient taking differently: Take 60 mg by mouth daily. ), Disp: 90 capsule, Rfl: 3 .  escitalopram (LEXAPRO) 20 MG tablet, TAKE 1 TABLET BY MOUTH EVERY DAY  (Patient taking differently: Take 10 mg by mouth daily. ), Disp: 90 tablet, Rfl: 1 .  famotidine (PEPCID) 40 MG tablet, Take 40 mg by mouth at bedtime., Disp: , Rfl:  .  furosemide (LASIX) 20 MG tablet, Take 1-3 tablets (20-60 mg total) by mouth daily., Disp: 200 tablet, Rfl: 1 .  glucose blood test strip, Check sugar three times daily  Dx: DMII insulin dependent with retinopathy, neuropathy controlled, Disp: 300 each, Rfl: 3 .  HYDROcodone-acetaminophen (NORCO) 10-325 MG tablet, Take 1 tablet by mouth every 12 (twelve) hours as needed. (Patient taking differently: Take 1 tablet by mouth every 8 (eight) hours as needed for moderate pain. ), Disp: 60 tablet, Rfl: 0 .  insulin aspart (NOVOLOG) 100 UNIT/ML injection, Inject 25 Units into the skin 3 (three) times daily with meals. (Patient taking differently: Inject 30 Units into the skin 3 (three) times daily with meals. ), Disp: 10 mL, Rfl: 11 .  Insulin Syringes, Disposable, U-100 0.5 ML MISC, 28 Units by Does not apply route 2 (two) times daily., Disp: 100 each, Rfl: 11 .  leflunomide (ARAVA) 20 MG tablet, Take 20 mg by mouth daily. , Disp: , Rfl:  .  LEVEMIR 100 UNIT/ML injection, INJECT 60 UNITS AT BEDTIME AS DIRECTED (Patient taking differently: Inject 70 Units into the skin at bedtime. ), Disp: 20 mL, Rfl: 1 .  linaclotide (LINZESS) 290 MCG CAPS capsule, Take 290 mcg by mouth daily before  breakfast. , Disp: , Rfl:  .  Needles & Syringes MISC, 1 Syringe by Does not apply route 2 (two) times daily., Disp: 100 each, Rfl: 11 .  omeprazole (PRILOSEC) 20 MG capsule, TAKE 1 CAPSULE BY MOUTH EVERY DAY (Patient taking differently: Take 20 mg by mouth daily. ), Disp: 90 capsule, Rfl: 3 .  oxybutynin (DITROPAN XL) 15 MG 24 hr tablet, TAKE 1 TABLET BY MOUTH AT BEDTIME (Patient taking differently: Take 15 mg by mouth at bedtime. ), Disp: 90 tablet, Rfl: 3 .  predniSONE (DELTASONE) 5 MG tablet, Take 5 mg by mouth daily as needed. Only takes with RA Flare., Disp:  , Rfl:  .  rosuvastatin (CRESTOR) 10 MG tablet, Take 1 tablet (10 mg total) by mouth daily., Disp: 90 tablet, Rfl: 1 .  traZODone (DESYREL) 100 MG tablet, TAKE 2 TABLETS BY MOUTH EVERY DAY AT BEDTIME (Patient taking differently: Take 200 mg by mouth at bedtime. ), Disp: 180 tablet, Rfl: 1 .  ULTICARE INSULIN SYRINGE 31G X 5/16" 0.5 ML MISC, USE AS DIRECTED TO INJECT INSULIN 2 TIMES DAILY, Disp: 100 each, Rfl: 4 .  allopurinol (ZYLOPRIM) 100 MG tablet, Take 1 tablet (100 mg total) by mouth daily. Ov needed (Patient taking differently: Take 200 mg by mouth daily. ), Disp: 90 tablet, Rfl: 3 .  Cholecalciferol (VITAMIN D3) 5000 units CAPS, Take 1 capsule by mouth daily., Disp: , Rfl:    Allergies  Allergen Reactions  . Codeine Anaphylaxis  . Contrast Media [Iodinated Diagnostic Agents] Anaphylaxis  . Nitrofurantoin Monohyd Macro Anaphylaxis  . Betadine [Povidone Iodine] Itching  . Folic Acid Itching  . Gabapentin Other (See Comments)    Makes patient feel drunk  . Iodine Hives  . Lyrica [Pregabalin] Other (See Comments)    Makes patient feel drunk  . Red Dye Itching  . Ultram [Tramadol Hcl] Nausea And Vomiting    Past Medical History:  Diagnosis Date  . Allergy    generic allergy pill; Spring and Fall only.  . Anxiety   . Arthritis    DDD lumbar, R hip OA.  s/p ortho consult in past.  . Blood transfusion without reported diagnosis    Mountain climbing accident in [place].  . Brachial plexus disorders   . Cataract    B retractions.  . Chronic kidney disease    stage 3 per pt.   . Chronic pain syndrome   . Chronic renal insufficiency, stage 3 (moderate)   . Constipation   . DDD (degenerative disc disease), lumbar   . Depression   . Diabetes mellitus   . Diabetic peripheral neuropathy associated with type 2 diabetes mellitus ([place])   . Diabetic retinopathy ([place])   . Diabetic retinopathy associated with type 2 diabetes mellitus ([place])    s/p laser treatment multiple.  Unable to  drive.  . Fatty liver   . Fibromyalgia   . Food allergy   . GERD (gastroesophageal reflux disease)   . Hypercholesteremia   . Hyperlipidemia   . Hypertension    controlled, off meds   . IBS (irritable bowel syndrome)   . Leg edema   . Neuromuscular disorder ([place])   . OSA (obstructive sleep apnea)   . Osteoarthritis   . Rheumatic fever   . Rheumatoid arthritis ([place])   . Stomach ulcer   . Swallowing difficulty   . TIA (transient ischemic attack)   . Ulcer    Peptic ulcer H. Pylori + s/p treatment.  Upper GI diagnosed.Marland Kitchen  Takes Prilosec PRN .     Past Surgical History:  Procedure Laterality Date  .  2 SPINAL INJECTIONS     . ABDOMINAL HYSTERECTOMY  11/02/1979   DUB; cervical dysplasia; ovaries intact.  . ABDOMINAL SURGERY     staph abcess   . [facility] Ad[facility]     age 48; three months in [place].  Marland Kitchen BREAST BIOPSY    . CARDIAC CATHETERIZATION  11/02/2007   normal coronary arteries.  . CARPAL TUNNEL RELEASE     Bilateral.  . CATARACT EXTRACTION, BILATERAL    . CHOLECYSTECTOMY    . ESOPHAGEAL MANOMETRY N/A 09/14/2017   Procedure: ESOPHAGEAL MANOMETRY (EM);  Surgeon: Ronnette Juniper, MD;  Location: [facility];  Service: Gastroenterology;  Laterality: N/A;  . EYE SURGERY     Cataracts B. Laser surgery x 7 for Diabetic Retinopathy  . TONSILLECTOMY      Family History  Adopted: Yes  Problem Relation Age of Onset  . Breast cancer Daughter     Social History   Tobacco Use  . Smoking status: Former Research scientist (life sciences)  . Smokeless tobacco: Never Used  . Tobacco comment: Quit 1987  Substance Use Topics  . Alcohol use: No    Alcohol/week: 0.0 standard drinks  . Drug use: No    ROS   Objective:   Vitals: BP 127/69   Pulse (!) 108   Temp 98.8 F (37.1 C)   Resp 18   SpO2 99%   Physical Exam Constitutional:      General: She is not in acute distress.    Appearance: Normal appearance. She is well-developed. She is not ill-appearing, toxic-appearing or  diaphoretic.  HENT:     Head: Normocephalic and atraumatic.     Right Ear: External ear normal.     Left Ear: External ear normal.     Nose: Nose normal.     Mouth/Throat:     Mouth: Mucous membranes are moist.  Eyes:     General: No scleral icterus.       Right eye: No discharge.        Left eye: No discharge.     Extraocular Movements: Extraocular movements intact.     Conjunctiva/sclera: Conjunctivae normal.     Pupils: Pupils are equal, round, and reactive to light.  Cardiovascular:     Rate and Rhythm: Normal rate and regular rhythm.     Pulses: Normal pulses.     Heart sounds: Normal heart sounds. No murmur heard.  No friction rub. No gallop.   Pulmonary:     Effort: Pulmonary effort is normal. No respiratory distress.     Breath sounds: Normal breath sounds. No stridor. No wheezing, rhonchi or rales.  Musculoskeletal:     Left hip: Tenderness present. No deformity, lacerations, bony tenderness or crepitus. Decreased range of motion. Normal strength.     Left upper leg: No swelling, edema, deformity, lacerations, tenderness or bony tenderness.     Left knee: Bony tenderness present. No swelling, deformity, effusion, erythema, ecchymosis, lacerations or crepitus. Decreased range of motion. Tenderness (throughout) present. Normal alignment and normal patellar mobility.     Left lower leg: No swelling, deformity, lacerations, tenderness or bony tenderness. No edema.     Left ankle: No swelling, deformity, ecchymosis or lacerations. No tenderness. Normal range of motion.     Left Achilles Tendon: No tenderness or defects. Thompson's test negative.     Left foot: Normal range of motion and normal capillary refill. No swelling,  deformity, laceration, tenderness, bony tenderness or crepitus.  Skin:    General: Skin is warm and dry.     Findings: No rash.  Neurological:     Mental Status: She is alert and oriented to person, place, and time.     Cranial Nerves: No cranial nerve  deficit.     Motor: No weakness.     Coordination: Coordination normal.     Gait: Gait normal.     Deep Tendon Reflexes: Reflexes normal.  Psychiatric:        Mood and Affect: Mood normal.        Behavior: Behavior normal.        Thought Content: Thought content normal.        Judgment: Judgment normal.    Radiology report pending, I do not see any signs of acute process on left hip or left knee x-ray.  4 inch Ace wrap applied to left knee.  Assessment and Plan :   I have reviewed the PDMP during this encounter.  1. Left leg pain   2. Left hip pain   3. Left groin pain   4. Left thigh pain   5. Acute pain of left knee   6. Acute left ankle pain   7. Accidental fall, initial encounter     We will manage conservatively with Tylenol and hydrocodone for breakthrough pain.  Recommended rest and emphasized need to maintain follow-up with cardiologist.  Will contact patient only if there are acute findings on the radiology report.  Counseled patient on potential for adverse effects with medications prescribed/recommended today, ER and return-to-clinic precautions discussed, patient verbalized understanding.    Jaynee Eagles, [place] 06/29/20 1417

## 2020-06-29 NOTE — ED Triage Notes (Signed)
Pt presents with complaints of pain in her left leg. States Friday afternoon she fell down her stairs at home that is around 16 stairs. States that has happened before. Complaining of pain in her entire left leg that is worse in the knee. Pt is ambulatory during intake. Denies head trauma or loc.

## 2020-06-29 NOTE — Discharge Instructions (Addendum)
Please just use Tylenol at a dose of 500mg -650mg  once every 6 hours as needed for your aches, pains, fevers. Do not use any nonsteroidal anti-inflammatories (NSAIDs) like ibuprofen, Motrin, naproxen, Aleve, etc. which are all available over-the-counter.  Use hydrocodone for breakthrough pain, that is pain not controlled by Tylenol alone.

## 2020-07-01 ENCOUNTER — Encounter: Payer: Self-pay | Admitting: Adult Health

## 2020-07-02 ENCOUNTER — Ambulatory Visit (INDEPENDENT_AMBULATORY_CARE_PROVIDER_SITE_OTHER): Payer: Medicare Other | Admitting: Adult Health

## 2020-07-02 ENCOUNTER — Other Ambulatory Visit: Payer: Self-pay

## 2020-07-02 ENCOUNTER — Encounter: Payer: Self-pay | Admitting: Adult Health

## 2020-07-02 VITALS — BP 157/89 | HR 109 | Ht 66.0 in | Wt 175.0 lb

## 2020-07-02 DIAGNOSIS — G4733 Obstructive sleep apnea (adult) (pediatric): Secondary | ICD-10-CM

## 2020-07-02 DIAGNOSIS — Z9989 Dependence on other enabling machines and devices: Secondary | ICD-10-CM

## 2020-07-02 NOTE — Progress Notes (Signed)
PATIENT: Angel French DOB: 12/02/1950  REASON FOR VISIT: follow up HISTORY FROM: patient  HISTORY OF PRESENT ILLNESS: Today 07/02/20:  [name] is a 69 year old female with a history of obstructive sleep apnea on CPAP.  Her download indicates that she use her machine 29 out of 30 days for compliance of 97%.  She use her machine greater than 4 hours 28 days for compliance of 93%.  Her residual AHI is 2.2 on 5 to 15 cm of water with EPR of 3.  Leak in the 95th percentile is 11.4 L/min.  She states that night the mask sometimes wakes her up leaking.  She does state that she is a restless sleeper.  HISTORY 06/28/19: [name] is a 69 year old female with a history of obstructive sleep apnea on CPAP.  Her download indicates that she uses her machine 29 out of 30 days for compliance of 97%.  She uses her machine greater than 4 hours 28 days for compliance of 93%.  On average she uses her machine 6 hours and 29 minutes.  Her residual AHI is 1.1 on 5 to 15 cm of water with EPR of 3.  Her leak in the 95th percentile is 10.5 liters per minute.  She states the CPAP works well with the exception of her mask.  She states that the mask will not stay on her face and she is often having to readjust it at night.  Other than that she has done well with the machine.  REVIEW OF SYSTEMS: Out of a complete 14 system review of symptoms, the patient complains only of the following symptoms, and all other reviewed systems are negative.  FSS 55 ESS 9  ALLERGIES: Allergies  Allergen Reactions  . Codeine Anaphylaxis  . Contrast Media [Iodinated Diagnostic Agents] Anaphylaxis  . Nitrofurantoin Monohyd Macro Anaphylaxis  . Betadine [Povidone Iodine] Itching  . Folic Acid Itching  . Gabapentin Other (See Comments)    Makes patient feel drunk  . Iodine Hives  . Lyrica [Pregabalin] Other (See Comments)    Makes patient feel drunk  . Red Dye Itching  . Ultram [Tramadol Hcl] Nausea And Vomiting     HOME MEDICATIONS: Outpatient Medications Prior to Visit  Medication Sig Dispense Refill  . acetaminophen (TYLENOL) 325 MG tablet Take 650 mg by mouth every 6 (six) hours as needed (every 6 weeks before RA infusion).    Marland Kitchen allopurinol (ZYLOPRIM) 100 MG tablet Take 1 tablet (100 mg total) by mouth daily. Ov needed (Patient taking differently: Take 200 mg by mouth daily. ) 90 tablet 3  . aspirin 325 MG tablet Take 325 mg by mouth daily.     . Blood Glucose Monitoring Suppl (BLOOD GLUCOSE METER KIT AND SUPPLIES) KIT Dispense based on patient and insurance preference. Use up to four times daily as directed. (FOR ICD-9 250.00, 250.01). 1 each 11  . Cholecalciferol (VITAMIN D3) 5000 units CAPS Take 1 capsule by mouth daily.    . diclofenac sodium (VOLTAREN) 1 % GEL Apply 2 g topically 4 (four) times daily. (Patient taking differently: Apply 2 g topically 2 (two) times daily as needed (pain). ) 100 g 3  . diphenhydrAMINE (BENADRYL) 25 MG tablet Take 25 mg by mouth every 6 (six) hours as needed (every 6 weeks prior to RA infusion).    . DULoxetine (CYMBALTA) 30 MG capsule TAKE 1 CAPSULE BY MOUTH EVERY DAY (Patient taking differently: Take 60 mg by mouth daily. ) 90 capsule 3  .  escitalopram (LEXAPRO) 20 MG tablet TAKE 1 TABLET BY MOUTH EVERY DAY (Patient taking differently: Take 10 mg by mouth daily. ) 90 tablet 1  . famotidine (PEPCID) 40 MG tablet Take 40 mg by mouth at bedtime.    . furosemide (LASIX) 20 MG tablet Take 1-3 tablets (20-60 mg total) by mouth daily. 200 tablet 1  . glucose blood test strip Check sugar three times daily  Dx: DMII insulin dependent with retinopathy, neuropathy controlled 300 each 3  . HYDROcodone-acetaminophen (NORCO) 10-325 MG tablet Take 1 tablet by mouth every 12 (twelve) hours as needed. (Patient taking differently: Take 1 tablet by mouth every 8 (eight) hours as needed for moderate pain. ) 60 tablet 0  . insulin aspart (NOVOLOG) 100 UNIT/ML injection Inject 25 Units  into the skin 3 (three) times daily with meals. (Patient taking differently: Inject 30 Units into the skin 3 (three) times daily with meals. ) 10 mL 11  . Insulin Syringes, Disposable, U-100 0.5 ML MISC 28 Units by Does not apply route 2 (two) times daily. 100 each 11  . leflunomide (ARAVA) 20 MG tablet Take 20 mg by mouth daily.     Marland Kitchen LEVEMIR 100 UNIT/ML injection INJECT 60 UNITS AT BEDTIME AS DIRECTED (Patient taking differently: Inject 70 Units into the skin at bedtime. ) 20 mL 1  . linaclotide (LINZESS) 290 MCG CAPS capsule Take 290 mcg by mouth daily before breakfast.     . Needles & Syringes MISC 1 Syringe by Does not apply route 2 (two) times daily. 100 each 11  . omeprazole (PRILOSEC) 20 MG capsule TAKE 1 CAPSULE BY MOUTH EVERY DAY (Patient taking differently: Take 20 mg by mouth daily. ) 90 capsule 3  . oxybutynin (DITROPAN XL) 15 MG 24 hr tablet TAKE 1 TABLET BY MOUTH AT BEDTIME (Patient taking differently: Take 15 mg by mouth at bedtime. ) 90 tablet 3  . predniSONE (DELTASONE) 5 MG tablet Take 5 mg by mouth daily as needed. Only takes with RA Flare.    . rosuvastatin (CRESTOR) 10 MG tablet Take 1 tablet (10 mg total) by mouth daily. 90 tablet 1  . traZODone (DESYREL) 100 MG tablet TAKE 2 TABLETS BY MOUTH EVERY DAY AT BEDTIME (Patient taking differently: Take 200 mg by mouth at bedtime. ) 180 tablet 1  . ULTICARE INSULIN SYRINGE 31G X 5/16" 0.5 ML MISC USE AS DIRECTED TO INJECT INSULIN 2 TIMES DAILY 100 each 4   No facility-administered medications prior to visit.    PAST MEDICAL HISTORY: Past Medical History:  Diagnosis Date  . Allergy    generic allergy pill; Spring and Fall only.  . Anxiety   . Arthritis    DDD lumbar, R hip OA.  s/p ortho consult in past.  . Blood transfusion without reported diagnosis    Mountain climbing accident in [place].  . Brachial plexus disorders   . Cataract    B retractions.  . Chronic kidney disease    stage 3 per pt.   . Chronic pain syndrome    . Chronic renal insufficiency, stage 3 (moderate)   . Constipation   . DDD (degenerative disc disease), lumbar   . Depression   . Diabetes mellitus   . Diabetic peripheral neuropathy associated with type 2 diabetes mellitus ([place])   . Diabetic retinopathy ([place])   . Diabetic retinopathy associated with type 2 diabetes mellitus ([place])    s/p laser treatment multiple.  Unable to drive.  . Fatty liver   .  Fibromyalgia   . Food allergy   . GERD (gastroesophageal reflux disease)   . Hypercholesteremia   . Hyperlipidemia   . Hypertension    controlled, off meds   . IBS (irritable bowel syndrome)   . Leg edema   . Neuromuscular disorder ([place])   . OSA (obstructive sleep apnea)   . Osteoarthritis   . Rheumatic fever   . Rheumatoid arthritis ([place])   . Stomach ulcer   . Swallowing difficulty   . TIA (transient ischemic attack)   . Ulcer    Peptic ulcer H. Pylori + s/p treatment.  Upper GI diagnosed.[name] Prilosec PRN .    PAST SURGICAL HISTORY: Past Surgical History:  Procedure Laterality Date  .  2 SPINAL INJECTIONS     . ABDOMINAL HYSTERECTOMY  11/02/1979   DUB; cervical dysplasia; ovaries intact.  . ABDOMINAL SURGERY     staph abcess   . [facility] Ad[facility]     age 89; three months in [place].  Marland Kitchen BREAST BIOPSY    . CARDIAC CATHETERIZATION  11/02/2007   normal coronary arteries.  . CARPAL TUNNEL RELEASE     Bilateral.  . CATARACT EXTRACTION, BILATERAL    . CHOLECYSTECTOMY    . ESOPHAGEAL MANOMETRY N/A 09/14/2017   Procedure: ESOPHAGEAL MANOMETRY (EM);  Surgeon: Ronnette Juniper, MD;  Location: [facility];  Service: Gastroenterology;  Laterality: N/A;  . EYE SURGERY     Cataracts B. Laser surgery x 7 for Diabetic Retinopathy  . TONSILLECTOMY      FAMILY HISTORY: Family History  Adopted: Yes  Problem Relation Age of Onset  . Breast cancer Daughter     SOCIAL HISTORY: Social History   Socioeconomic History  . Marital status: Widowed    Spouse name: Marijean Niemann  . Number of children: 2  . Years of education: college  . Highest education level: Not on file  Occupational History  . Occupation: retired    Comment: retretied  Tobacco Use  . Smoking status: Former Research scientist (life sciences)  . Smokeless tobacco: Never Used  . Tobacco comment: Quit 1987  Substance and Sexual Activity  . Alcohol use: No    Alcohol/week: 0.0 standard drinks  . Drug use: No  . Sexual activity: Yes    Birth control/protection: Surgical, Post-menopausal    Comment: widow  Other Topics Concern  . Not on file  Social History Narrative   Marital status: widowed since 2009; dating x 6 years.  Happy; no abuse.      Children: 2 children (26 daughter, 30 son estranged); 2 grandchildren.      Lives: with boyfriend, daughter, granddaughter, friend of daughter.  Lives in pt house.      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  Diabetic retinopathy; unable to drive.      Tobacco:  Smoked x 20 years; quit 20 years.      Alcohol:  On special occasions; once per week on average.       Drugs:  None since college.      Exercise:  Walking several times per week; walks the dog.   Education college   Caffeine one cup daily.   Right handed      Advanced Directives: none; FULL CODE.  DNR/DNI.  HCPOA: [place]?           Social Determinants of Health   Financial Resource Strain:   . Difficulty of Paying Living Expenses: Not on file  Food Insecurity:   .  Worried About Charity fundraiser in the Last Year: Not on file  . Ran Out of Food in the Last Year: Not on file  Transportation Needs:   . Lack of Transportation (Medical): Not on file  . Lack of Transportation (Non-Medical): Not on file  Physical Activity:   . Days of Exercise per Week: Not on file  . Minutes of Exercise per Session: Not on file  Stress:   . Feeling of Stress : Not on file  Social Connections:   . Frequency of Communication with Friends and Family: Not on file  . Frequency of Social Gatherings  with Friends and Family: Not on file  . Attends Religious Services: Not on file  . Active Member of Clubs or Organizations: Not on file  . Attends Archivist Meetings: Not on file  . Marital Status: Not on file  Intimate Partner Violence:   . Fear of Current or Ex-Partner: Not on file  . Emotionally Abused: Not on file  . Physically Abused: Not on file  . Sexually Abused: Not on file      PHYSICAL EXAM  Vitals:   07/02/20 1245  BP: (!) 157/89  Pulse: (!) 109  Weight: 175 lb (79.4 kg)  Height: _0  (1.676 m)   Body mass index is 28.25 kg/m.  Generalized: Well developed, in no acute distress  Chest: Lungs clear to auscultation bilaterally  Neurological examination  Mentation: Alert oriented to time, place, history taking. Follows all commands speech and language fluent Cranial nerve II-XII: Extraocular movements were full, visual field were full on confrontational test Head turning and shoulder shrug  were normal and symmetric. Motor: The motor testing reveals 5 over 5 strength of all 4 extremities. Good symmetric motor tone is noted throughout.  Sensory: Sensory testing is intact to soft touch on all 4 extremities. No evidence of extinction is noted.  Gait and station: Gait is normal.    DIAGNOSTIC DATA (LABS, IMAGING, TESTING) - I reviewed patient records, labs, notes, testing and imaging myself where available.  Lab Results  Component Value Date   WBC 3.9 (L) 06/23/2020   HGB 11.5 (L) 06/23/2020   HCT 35.8 (L) 06/23/2020   MCV 97.3 06/23/2020   PLT 280 06/23/2020      Component Value Date/Time   NA 138 06/23/2020 0646   NA 143 01/02/2019 1258   K 3.7 06/23/2020 0646   CL 96 (L) 06/23/2020 0646   CO2 28 06/23/2020 0646   GLUCOSE 230 (H) 06/23/2020 0646   BUN 14 06/23/2020 0646   BUN 31 (H) 01/02/2019 1258   CREATININE 1.63 (H) 06/23/2020 0646   CREATININE 1.30 (H) 09/08/2016 1035   CALCIUM 9.7 06/23/2020 0646   PROT 7.7 02/21/2020 1834   PROT  7.6 01/02/2019 1258   ALBUMIN 4.1 02/21/2020 1834   ALBUMIN 4.3 01/02/2019 1258   AST 26 02/21/2020 1834   ALT 16 02/21/2020 1834   ALKPHOS 107 02/21/2020 1834   BILITOT 0.6 02/21/2020 1834   BILITOT 0.3 01/02/2019 1258   GFRNONAA 32 (L) 06/23/2020 0646   GFRNONAA 36 (L) 07/10/2014 1354   GFRAA 37 (L) 06/23/2020 0646   GFRAA 41 (L) 07/10/2014 1354   Lab Results  Component Value Date   CHOL 162 01/02/2019   HDL 37 (L) 01/02/2019   LDLCALC 80 01/02/2019   TRIG 225 (H) 01/02/2019   CHOLHDL 3.9 08/03/2018   Lab Results  Component Value Date   HGBA1C 5.1 01/02/2019   Lab  Results  Component Value Date   MAUQJFHL45 625 01/30/2018   Lab Results  Component Value Date   TSH 0.958 03/07/2018      ASSESSMENT AND PLAN 69 y.o. year old female  has a past medical history of Allergy, Anxiety, Arthritis, Blood transfusion without reported diagnosis, Brachial plexus disorders, Cataract, Chronic kidney disease, Chronic pain syndrome, Chronic renal insufficiency, stage 3 (moderate), Constipation, DDD (degenerative disc disease), lumbar, Depression, Diabetes mellitus, Diabetic peripheral neuropathy associated with type 2 diabetes mellitus ([place]), Diabetic retinopathy ([place]), Diabetic retinopathy associated with type 2 diabetes mellitus ([place]), Fatty liver, Fibromyalgia, Food allergy, GERD (gastroesophageal reflux disease), Hypercholesteremia, Hyperlipidemia, Hypertension, IBS (irritable bowel syndrome), Leg edema, Neuromuscular disorder ([place]), OSA (obstructive sleep apnea), Osteoarthritis, Rheumatic fever, Rheumatoid arthritis ([place]), Stomach ulcer, Swallowing difficulty, TIA (transient ischemic attack), and Ulcer. here with:  1. OSA on CPAP  - CPAP compliance excellent - Good treatment of AHI  -Order sent for mask refitting - Encourage patient to use CPAP nightly and > 4 hours each night - F/U in 1 year or sooner if needed   I spent 25 minutes of face-to-face and non-face-to-face time with  patient.  This included previsit chart review, lab review, study review, order entry, electronic health record documentation, patient education.  Ward Givens, MSN, NP-C 07/02/2020, 1:13 PM [facility] [address] [place], [place] [address] [phone]

## 2020-07-02 NOTE — Patient Instructions (Signed)
Continue using CPAP nightly and greater than 4 hours each night Order sent for mask refitting If your symptoms worsen or you develop new symptoms please let us know.   

## 2020-07-09 NOTE — Progress Notes (Signed)
Cardiology Office Note:   Date:  07/10/2020  NAME:  Angel French    MRN: [id number] DOB:  1951-01-14   PCP:  Wardell Honour, MD  Cardiologist:  No primary care provider on file.   Referring MD: Wardell Honour, MD   Chief Complaint  Patient presents with  . Chest Pain   History of Present Illness:   Angel French is a 69 y.o. female with a hx of DM, HTN, HLD, obesity, CKD 3 who is being seen today for the evaluation of chest pain at the request of Wardell Honour, MD. Recent ER evaluation for CP. Negative work-up for ACS. Sent for evaluation.  She was evaluated in the emergency room roughly 3 to 4 weeks ago.  She reports she woke up in the morning and had chest pressure.  She reports associated symptoms of shortness of breath.  She reports she called the emergency services and ambulance took her to the hospital.  Apparently nitroglycerin made her pain better.  While in the emergency room troponins were negative x2.  EKG was inconsistent with ischemic changes.  She was then discharged.  She reports since leaving the hospital she gets daily episodes of a constellation of symptoms.  She reports she can get intermittent chest pain and pressure.  She reports the pain can last anywhere from 30 to 40 minutes up to hours.  She reports the pain is alleviated by taking a pain relieving medication.  She is not taking nitroglycerin.  She also reports she has constant nausea.  She reports an inability to eat food correctly.  She reports she is losing weight because she gets very full very early.  She has not been evaluated by her primary care physician for this yet.  Her EKG today demonstrates sinus tachycardia with a heart rate of 106.  She has no acute ischemic changes or evidence of prior infarction.  Her blood pressure is 132/72.  She does have a history of diabetes but her most recent A1c was 5.1.  Total cholesterol 162, HDL 37, LDL 80, triglycerides 225.  She is not had any recent thyroid  studies.  She reports she is had an EGD in the past that was normal.  She does take heartburn medication infrequently.  She is also taking aspirin 325.  She reports she is not that active but does use a walker.  Despite this she does not get any exertional chest pain.  Her pain appears to occur while sitting.  She reports significant stress in her life.  She lives with her daughter and grandchild.  She reports that things are quite stressful in the home.  She is a former smoker.  She does have significant CKD.  She also has rheumatoid arthritis on several agents for this.  She reports a family history of heart disease.  She does not use drugs or alcohol.  Problem List 1. Diabetes -A1c 5.1 2. Hypertension  3. HLD -T chol 162, HDL 37, LDL 80, TG 225 4. OSA 5. Fibromyalgia 6. Rheumatoid arthritis  7. CKD 3   Past Medical History: Past Medical History:  Diagnosis Date  . Allergy    generic allergy pill; Spring and Fall only.  . Anxiety   . Arthritis    DDD lumbar, R hip OA.  s/p ortho consult in past.  . Blood transfusion without reported diagnosis    Mountain climbing accident in [place].  . Brachial plexus disorders   . Cataract  B retractions.  . Chronic kidney disease    stage 3 per pt.   . Chronic pain syndrome   . Chronic renal insufficiency, stage 3 (moderate)   . Constipation   . DDD (degenerative disc disease), lumbar   . Depression   . Diabetes mellitus   . Diabetic peripheral neuropathy associated with type 2 diabetes mellitus ([place])   . Diabetic retinopathy ([place])   . Diabetic retinopathy associated with type 2 diabetes mellitus ([place])    s/p laser treatment multiple.  Unable to drive.  . Fatty liver   . Fibromyalgia   . Food allergy   . GERD (gastroesophageal reflux disease)   . Hypercholesteremia   . Hyperlipidemia   . Hypertension    controlled, off meds   . IBS (irritable bowel syndrome)   . Leg edema   . Neuromuscular disorder ([place])   . OSA (obstructive  sleep apnea)   . Osteoarthritis   . Rheumatic fever   . Rheumatoid arthritis ([place])   . Stomach ulcer   . Swallowing difficulty   . TIA (transient ischemic attack)   . Ulcer    Peptic ulcer H. Pylori + s/p treatment.  Upper GI diagnosed.[name] Prilosec PRN .    Past Surgical History: Past Surgical History:  Procedure Laterality Date  .  2 SPINAL INJECTIONS     . ABDOMINAL HYSTERECTOMY  11/02/1979   DUB; cervical dysplasia; ovaries intact.  . ABDOMINAL SURGERY     staph abcess   . [facility] Ad[facility]     age 31; three months in [place].  Marland Kitchen BREAST BIOPSY    . CARDIAC CATHETERIZATION  11/02/2007   normal coronary arteries.  . CARPAL TUNNEL RELEASE     Bilateral.  . CATARACT EXTRACTION, BILATERAL    . CHOLECYSTECTOMY    . ESOPHAGEAL MANOMETRY N/A 09/14/2017   Procedure: ESOPHAGEAL MANOMETRY (EM);  Surgeon: Ronnette Juniper, MD;  Location: [facility];  Service: Gastroenterology;  Laterality: N/A;  . EYE SURGERY     Cataracts B. Laser surgery x 7 for Diabetic Retinopathy  . TONSILLECTOMY      Current Medications: Current Meds  Medication Sig  . acetaminophen (TYLENOL) 325 MG tablet Take 650 mg by mouth every 6 (six) hours as needed (every 6 weeks before RA infusion).  Marland Kitchen allopurinol (ZYLOPRIM) 100 MG tablet Take 1 tablet (100 mg total) by mouth daily. Ov needed  . Blood Glucose Monitoring Suppl (BLOOD GLUCOSE METER KIT AND SUPPLIES) KIT Dispense based on patient and insurance preference. Use up to four times daily as directed. (FOR ICD-9 250.00, 250.01).  . Cholecalciferol (VITAMIN D3) 5000 units CAPS Take 1 capsule by mouth daily.  . diclofenac sodium (VOLTAREN) 1 % GEL Apply 2 g topically 4 (four) times daily.  . diphenhydrAMINE (BENADRYL) 25 MG tablet Take 25 mg by mouth every 6 (six) hours as needed (every 6 weeks prior to RA infusion).  . DULoxetine (CYMBALTA) 30 MG capsule TAKE 1 CAPSULE BY MOUTH EVERY DAY  . escitalopram (LEXAPRO) 20 MG tablet TAKE 1 TABLET BY MOUTH  EVERY DAY (Patient taking differently: Take 10 mg by mouth daily. )  . famotidine (PEPCID) 40 MG tablet Take 40 mg by mouth at bedtime.  . furosemide (LASIX) 20 MG tablet Take 1-3 tablets (20-60 mg total) by mouth daily.  Marland Kitchen glucose blood test strip Check sugar three times daily  Dx: DMII insulin dependent with retinopathy, neuropathy controlled  . HYDROcodone-acetaminophen (NORCO) 10-325 MG tablet Take 1 tablet by  mouth every 12 (twelve) hours as needed. (Patient taking differently: Take 1 tablet by mouth every 8 (eight) hours as needed for moderate pain. )  . insulin aspart (NOVOLOG) 100 UNIT/ML injection Inject 25 Units into the skin 3 (three) times daily with meals. (Patient taking differently: Inject 30 Units into the skin 3 (three) times daily with meals. )  . Insulin Syringes, Disposable, U-100 0.5 ML MISC 28 Units by Does not apply route 2 (two) times daily.  Marland Kitchen leflunomide (ARAVA) 20 MG tablet Take 20 mg by mouth daily.   Marland Kitchen LEVEMIR 100 UNIT/ML injection INJECT 60 UNITS AT BEDTIME AS DIRECTED (Patient taking differently: Inject 70 Units into the skin at bedtime. )  . linaclotide (LINZESS) 290 MCG CAPS capsule Take 290 mcg by mouth daily before breakfast.   . Needles & Syringes MISC 1 Syringe by Does not apply route 2 (two) times daily.  Marland Kitchen omeprazole (PRILOSEC) 20 MG capsule TAKE 1 CAPSULE BY MOUTH EVERY DAY (Patient taking differently: Take 20 mg by mouth daily. )  . oxybutynin (DITROPAN XL) 15 MG 24 hr tablet TAKE 1 TABLET BY MOUTH AT BEDTIME (Patient taking differently: Take 15 mg by mouth at bedtime. )  . predniSONE (DELTASONE) 5 MG tablet Take 5 mg by mouth daily as needed. Only takes with RA Flare.  . rosuvastatin (CRESTOR) 10 MG tablet Take 1 tablet (10 mg total) by mouth daily.  . traZODone (DESYREL) 100 MG tablet TAKE 2 TABLETS BY MOUTH EVERY DAY AT BEDTIME (Patient taking differently: Take 200 mg by mouth at bedtime. )  . ULTICARE INSULIN SYRINGE 31G X 5/16" 0.5 ML MISC USE AS DIRECTED  TO INJECT INSULIN 2 TIMES DAILY  . [DISCONTINUED] aspirin 325 MG tablet Take 325 mg by mouth daily.      Allergies:    Codeine, Contrast media [iodinated diagnostic agents], Nitrofurantoin monohyd macro, Betadine [povidone iodine], Folic acid, Gabapentin, Iodine, Lyrica [pregabalin], Red dye, and Ultram [tramadol hcl]   Social History: Social History   Socioeconomic History  . Marital status: Widowed    Spouse name: Marijean Niemann  . Number of children: 2  . Years of education: college  . Highest education level: Not on file  Occupational History  . Occupation: retired    Comment: retretied  Tobacco Use  . Smoking status: Former Smoker    Years: 22.00  . Smokeless tobacco: Never Used  . Tobacco comment: Quit 1987  Substance and Sexual Activity  . Alcohol use: No    Alcohol/week: 0.0 standard drinks  . Drug use: No  . Sexual activity: Yes    Birth control/protection: Surgical, Post-menopausal    Comment: widow  Other Topics Concern  . Not on file  Social History Narrative   Marital status: widowed since 2009; dating x 6 years.  Happy; no abuse.      Children: 2 children (20 daughter, 24 son estranged); 2 grandchildren.      Lives: with boyfriend, daughter, granddaughter, friend of daughter.  Lives in pt house.      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  Diabetic retinopathy; unable to drive.      Tobacco:  Smoked x 20 years; quit 20 years.      Alcohol:  On special occasions; once per week on average.       Drugs:  None since college.      Exercise:  Walking several times per week; walks the dog.   Education college   Caffeine one  cup daily.   Right handed      Advanced Directives: none; FULL CODE.  DNR/DNI.  HCPOA: [place]?           Social Determinants of Health   Financial Resource Strain:   . Difficulty of Paying Living Expenses: Not on file  Food Insecurity:   . Worried About Charity fundraiser in the Last Year: Not on file  . Ran Out  of Food in the Last Year: Not on file  Transportation Needs:   . Lack of Transportation (Medical): Not on file  . Lack of Transportation (Non-Medical): Not on file  Physical Activity:   . Days of Exercise per Week: Not on file  . Minutes of Exercise per Session: Not on file  Stress:   . Feeling of Stress : Not on file  Social Connections:   . Frequency of Communication with Friends and Family: Not on file  . Frequency of Social Gatherings with Friends and Family: Not on file  . Attends Religious Services: Not on file  . Active Member of Clubs or Organizations: Not on file  . Attends Archivist Meetings: Not on file  . Marital Status: Not on file     Family History: The patient's family history includes Breast cancer in her daughter. She was adopted.  ROS:   All other ROS reviewed and negative. Pertinent positives noted in the HPI.     EKGs/Labs/Other Studies Reviewed:   The following studies were personally reviewed by me today:  EKG:  EKG is ordered today.  The ekg ordered today demonstrates sinus tachycardia, heart rate 106, left axis deviation noted, and was personally reviewed by me.   Recent Labs: 02/21/2020: ALT 16 06/23/2020: BUN 14; Creatinine, Ser 1.63; Hemoglobin 11.5; Platelets 280; Potassium 3.7; Sodium 138   Recent Lipid Panel    Component Value Date/Time   CHOL 162 01/02/2019 1258   TRIG 225 (H) 01/02/2019 1258   HDL 37 (L) 01/02/2019 1258   CHOLHDL 3.9 08/03/2018 1209   CHOLHDL 5.3 (H) 09/08/2016 1035   VLDL 46 (H) 09/08/2016 1035   LDLCALC 80 01/02/2019 1258    Physical Exam:   VS:  BP 132/72   Pulse (!) 106   Ht 5' 2.5" (1.588 m)   Wt 170 lb 6.4 oz (77.3 kg)   SpO2 95%   BMI 30.67 kg/m    Wt Readings from Last 3 Encounters:  07/10/20 170 lb 6.4 oz (77.3 kg)  07/02/20 175 lb (79.4 kg)  06/23/20 187 lb 6.3 oz (85 kg)    General: Well nourished, well developed, in no acute distress Heart: Atraumatic, normal size  Eyes: PEERLA, EOMI   Neck: Supple, no JVD Endocrine: No thryomegaly Cardiac: Normal S1, S2; RRR; no murmurs, rubs, or gallops Lungs: Clear to auscultation bilaterally, no wheezing, rhonchi or rales  Abd: Soft, nontender, no hepatomegaly  Ext: No edema, pulses 2+ Musculoskeletal: No deformities, BUE and BLE strength normal and equal Skin: Warm and dry, no rashes   Neuro: Alert and oriented to person, place, time, and situation, CNII-XII grossly intact, no focal deficits  Psych: Normal mood and affect   ASSESSMENT:   Angel French is a 69 y.o. female who presents for the following: 1. Chest pain, unspecified type   2. Mixed hyperlipidemia   3. Essential hypertension   4. Sinus tachycardia     PLAN:   1. Chest pain, unspecified type -Atypical chest pain.  EKG shows no ischemic changes.  She  does have poor R wave progression which is likely obesity related.  Troponin values were negative in the emergency room.  She also has a constellation of symptoms that include early satiety and weight loss.  These also coincide with her symptoms of chest pain.  She does have plans for CT chest by her primary care physician which I do agree with.  Her symptoms are more concerning for esophageal pathology.  Nonetheless, we will proceed with a Lexiscan nuclear medicine stress test.  I would prefer cardiac CTA however given her kidney function I think we should proceed with an option that does not impact her kidneys.  I would also like for her to get an echocardiogram.  Given her tachycardia she needs a TSH.  I have also encouraged her to see her primary care physician for work-up of the early satiety and weight loss is this could be gastrointestinal pathology were dealing with.  2. Mixed hyperlipidemia -Continue statin.  3. Essential hypertension -Well-controlled.  Continue this.  4. Sinus tachycardia -Check TSH today.  Disposition: Return in about 2 months (around 09/09/2020).  Medication Adjustments/Labs and  Tests Ordered: Current medicines are reviewed at length with the patient today.  Concerns regarding medicines are outlined above.  Orders Placed This Encounter  Procedures  . TSH  . MYOCARDIAL PERFUSION IMAGING  . EKG 12-Lead  . ECHOCARDIOGRAM COMPLETE   Meds ordered this encounter  Medications  . aspirin EC 81 MG tablet    Sig: Take 1 tablet (81 mg total) by mouth daily. Swallow whole.    Dispense:  90 tablet    Refill:  3    Patient Instructions  Medication Instructions:  Reduce Aspirin to 81 mg daily   *If you need a refill on your cardiac medications before your next appointment, please call your pharmacy*   Lab Work: TSH today   If you have labs (blood work) drawn today and your tests are completely normal, you will receive your results only by: Marland Kitchen MyChart Message (if you have MyChart) OR . A paper copy in the mail If you have any lab test that is abnormal or we need to change your treatment, we will call you to review the results.   Testing/Procedures:  Echocardiogram - Your physician has requested that you have an echocardiogram. Echocardiography is a painless test that uses sound waves to create images of your heart. It provides your doctor with information about the size and shape of your heart and how well your heart's chambers and valves are working. This procedure takes approximately one hour. There are no restrictions for this procedure. This will be performed at our [facility] location - [address], [address].  Your physician has requested that you have a lexiscan myoview. A cardiac stress test is a cardiological test that measures the heart's ability to respond to external stress in a controlled clinical environment. The stress response is induced by intravenous pharmacological stimulation.    Follow-Up: At [facility], you and your health needs are our priority.  As part of our continuing mission to provide you with exceptional heart care, we have  created designated Provider Care Teams.  These Care Teams include your primary Cardiologist (physician) and Advanced Practice Providers (APPs -  Physician Assistants and Nurse Practitioners) who all work together to provide you with the care you need, when you need it.  We recommend signing up for the patient portal called "MyChart".  Sign up information is provided on this After  Visit Summary.  MyChart is used to connect with patients for Virtual Visits (Telemedicine).  Patients are able to view lab/test results, encounter notes, upcoming appointments, etc.  Non-urgent messages can be sent to your provider as well.   To learn more about what you can do with MyChart, go to [email or web address].    Your next appointment:   2 month(s)  The format for your next appointment:   In Person  Provider:   Eleonore Chiquito, MD         Signed, Addison Naegeli. Audie Box, [place]  [address], [address] letter with his doctor today currently out of take care of who is who we did talk to me what was it [place], [place] [address] [phone]  07/10/2020 4:07 PM

## 2020-07-10 ENCOUNTER — Other Ambulatory Visit: Payer: Self-pay

## 2020-07-10 ENCOUNTER — Encounter: Payer: Self-pay | Admitting: Cardiovascular Disease

## 2020-07-10 ENCOUNTER — Ambulatory Visit (INDEPENDENT_AMBULATORY_CARE_PROVIDER_SITE_OTHER): Payer: Medicare Other | Admitting: Cardiovascular Disease

## 2020-07-10 VITALS — BP 132/72 | HR 106 | Ht 62.5 in | Wt 170.4 lb

## 2020-07-10 DIAGNOSIS — R000 Tachycardia, unspecified: Secondary | ICD-10-CM | POA: Diagnosis not present

## 2020-07-10 DIAGNOSIS — R079 Chest pain, unspecified: Secondary | ICD-10-CM

## 2020-07-10 DIAGNOSIS — I10 Essential (primary) hypertension: Secondary | ICD-10-CM

## 2020-07-10 DIAGNOSIS — E782 Mixed hyperlipidemia: Secondary | ICD-10-CM

## 2020-07-10 MED ORDER — ASPIRIN EC 81 MG PO TBEC: 81.0000 mg | DELAYED_RELEASE_TABLET | Freq: Every day | ORAL | 3 refills | Status: DC

## 2020-07-10 NOTE — Patient Instructions (Signed)
Medication Instructions:  Reduce Aspirin to 81 mg daily   *If you need a refill on your cardiac medications before your next appointment, please call your pharmacy*   Lab Work: TSH today   If you have labs (blood work) drawn today and your tests are completely normal, you will receive your results only by: Marland Kitchen MyChart Message (if you have MyChart) OR . A paper copy in the mail If you have any lab test that is abnormal or we need to change your treatment, we will call you to review the results.   Testing/Procedures:  Echocardiogram - Your physician has requested that you have an echocardiogram. Echocardiography is a painless test that uses sound waves to create images of your heart. It provides your doctor with information about the size and shape of your heart and how well your heart's chambers and valves are working. This procedure takes approximately one hour. There are no restrictions for this procedure. This will be performed at our [facility] location - [address], [address].  Your physician has requested that you have a lexiscan myoview. A cardiac stress test is a cardiological test that measures the heart's ability to respond to external stress in a controlled clinical environment. The stress response is induced by intravenous pharmacological stimulation.    Follow-Up: At [facility], you and your health needs are our priority.  As part of our continuing mission to provide you with exceptional heart care, we have created designated Provider Care Teams.  These Care Teams include your primary Cardiologist (physician) and Advanced Practice Providers (APPs -  Physician Assistants and Nurse Practitioners) who all work together to provide you with the care you need, when you need it.  We recommend signing up for the patient portal called "MyChart".  Sign up information is provided on this After Visit Summary.  MyChart is used to connect with patients for Virtual Visits (Telemedicine).   Patients are able to view lab/test results, encounter notes, upcoming appointments, etc.  Non-urgent messages can be sent to your provider as well.   To learn more about what you can do with MyChart, go to [email or web address].    Your next appointment:   2 month(s)  The format for your next appointment:   In Person  Provider:   Eleonore Chiquito, MD

## 2020-07-11 LAB — TSH: TSH: 0.872 u[IU]/mL (ref 0.450–4.500)

## 2020-07-16 DIAGNOSIS — M0609 Rheumatoid arthritis without rheumatoid factor, multiple sites: Secondary | ICD-10-CM | POA: Diagnosis not present

## 2020-07-23 ENCOUNTER — Encounter (HOSPITAL_COMMUNITY): Payer: Self-pay | Admitting: *Deleted

## 2020-07-23 ENCOUNTER — Telehealth (HOSPITAL_COMMUNITY): Payer: Self-pay | Admitting: *Deleted

## 2020-07-23 NOTE — Telephone Encounter (Signed)
Attempted to leave a message on voicemail in reference to upcoming appointment scheduled for 07/30/2020 but voicemail box unavailable.[place] Mychart letter sent with instructions.

## 2020-07-25 ENCOUNTER — Inpatient Hospital Stay (HOSPITAL_COMMUNITY)
Admission: EM | Admit: 2020-07-25 | Discharge: 2020-07-29 | DRG: 074 | Disposition: A | Discharge status: Home Health | Payer: Medicare Other | Attending: Internal Medicine | Admitting: Internal Medicine

## 2020-07-25 ENCOUNTER — Emergency Department (HOSPITAL_COMMUNITY): Payer: Medicare Other

## 2020-07-25 ENCOUNTER — Encounter (HOSPITAL_COMMUNITY): Payer: Self-pay | Admitting: *Deleted

## 2020-07-25 ENCOUNTER — Other Ambulatory Visit: Payer: Self-pay

## 2020-07-25 DIAGNOSIS — E1136 Type 2 diabetes mellitus with diabetic cataract: Secondary | ICD-10-CM | POA: Diagnosis present

## 2020-07-25 DIAGNOSIS — R1310 Dysphagia, unspecified: Secondary | ICD-10-CM

## 2020-07-25 DIAGNOSIS — K6389 Other specified diseases of intestine: Secondary | ICD-10-CM | POA: Diagnosis not present

## 2020-07-25 DIAGNOSIS — K5900 Constipation, unspecified: Secondary | ICD-10-CM | POA: Diagnosis present

## 2020-07-25 DIAGNOSIS — E7800 Pure hypercholesterolemia, unspecified: Secondary | ICD-10-CM | POA: Diagnosis present

## 2020-07-25 DIAGNOSIS — F419 Anxiety disorder, unspecified: Secondary | ICD-10-CM | POA: Diagnosis present

## 2020-07-25 DIAGNOSIS — M069 Rheumatoid arthritis, unspecified: Secondary | ICD-10-CM | POA: Diagnosis present

## 2020-07-25 DIAGNOSIS — Z743 Need for continuous supervision: Secondary | ICD-10-CM | POA: Diagnosis not present

## 2020-07-25 DIAGNOSIS — R630 Anorexia: Secondary | ICD-10-CM

## 2020-07-25 DIAGNOSIS — R0781 Pleurodynia: Secondary | ICD-10-CM | POA: Diagnosis present

## 2020-07-25 DIAGNOSIS — Z7982 Long term (current) use of aspirin: Secondary | ICD-10-CM

## 2020-07-25 DIAGNOSIS — M5136 Other intervertebral disc degeneration, lumbar region: Secondary | ICD-10-CM | POA: Diagnosis present

## 2020-07-25 DIAGNOSIS — E785 Hyperlipidemia, unspecified: Secondary | ICD-10-CM | POA: Diagnosis present

## 2020-07-25 DIAGNOSIS — K2970 Gastritis, unspecified, without bleeding: Secondary | ICD-10-CM | POA: Diagnosis present

## 2020-07-25 DIAGNOSIS — R1084 Generalized abdominal pain: Secondary | ICD-10-CM | POA: Diagnosis not present

## 2020-07-25 DIAGNOSIS — E876 Hypokalemia: Secondary | ICD-10-CM | POA: Diagnosis present

## 2020-07-25 DIAGNOSIS — R627 Adult failure to thrive: Secondary | ICD-10-CM | POA: Diagnosis present

## 2020-07-25 DIAGNOSIS — G894 Chronic pain syndrome: Secondary | ICD-10-CM | POA: Diagnosis present

## 2020-07-25 DIAGNOSIS — Z91048 Other nonmedicinal substance allergy status: Secondary | ICD-10-CM

## 2020-07-25 DIAGNOSIS — D631 Anemia in chronic kidney disease: Secondary | ICD-10-CM | POA: Diagnosis present

## 2020-07-25 DIAGNOSIS — M797 Fibromyalgia: Secondary | ICD-10-CM | POA: Diagnosis present

## 2020-07-25 DIAGNOSIS — F329 Major depressive disorder, single episode, unspecified: Secondary | ICD-10-CM | POA: Diagnosis present

## 2020-07-25 DIAGNOSIS — E11319 Type 2 diabetes mellitus with unspecified diabetic retinopathy without macular edema: Secondary | ICD-10-CM | POA: Diagnosis present

## 2020-07-25 DIAGNOSIS — E1143 Type 2 diabetes mellitus with diabetic autonomic (poly)neuropathy: Principal | ICD-10-CM | POA: Diagnosis present

## 2020-07-25 DIAGNOSIS — N1831 Chronic kidney disease, stage 3a: Secondary | ICD-10-CM | POA: Diagnosis present

## 2020-07-25 DIAGNOSIS — R109 Unspecified abdominal pain: Secondary | ICD-10-CM | POA: Diagnosis present

## 2020-07-25 DIAGNOSIS — E1122 Type 2 diabetes mellitus with diabetic chronic kidney disease: Secondary | ICD-10-CM | POA: Diagnosis present

## 2020-07-25 DIAGNOSIS — Z794 Long term (current) use of insulin: Secondary | ICD-10-CM

## 2020-07-25 DIAGNOSIS — M1712 Unilateral primary osteoarthritis, left knee: Secondary | ICD-10-CM | POA: Diagnosis present

## 2020-07-25 DIAGNOSIS — Z87891 Personal history of nicotine dependence: Secondary | ICD-10-CM

## 2020-07-25 DIAGNOSIS — I129 Hypertensive chronic kidney disease with stage 1 through stage 4 chronic kidney disease, or unspecified chronic kidney disease: Secondary | ICD-10-CM | POA: Diagnosis present

## 2020-07-25 DIAGNOSIS — Z803 Family history of malignant neoplasm of breast: Secondary | ICD-10-CM

## 2020-07-25 DIAGNOSIS — Z20822 Contact with and (suspected) exposure to covid-19: Secondary | ICD-10-CM | POA: Diagnosis present

## 2020-07-25 DIAGNOSIS — Z888 Allergy status to other drugs, medicaments and biological substances status: Secondary | ICD-10-CM

## 2020-07-25 DIAGNOSIS — Z79899 Other long term (current) drug therapy: Secondary | ICD-10-CM

## 2020-07-25 DIAGNOSIS — E1142 Type 2 diabetes mellitus with diabetic polyneuropathy: Secondary | ICD-10-CM | POA: Diagnosis present

## 2020-07-25 DIAGNOSIS — K219 Gastro-esophageal reflux disease without esophagitis: Secondary | ICD-10-CM | POA: Diagnosis present

## 2020-07-25 DIAGNOSIS — Z8673 Personal history of transient ischemic attack (TIA), and cerebral infarction without residual deficits: Secondary | ICD-10-CM

## 2020-07-25 DIAGNOSIS — R634 Abnormal weight loss: Secondary | ICD-10-CM | POA: Diagnosis not present

## 2020-07-25 DIAGNOSIS — Z91041 Radiographic dye allergy status: Secondary | ICD-10-CM

## 2020-07-25 DIAGNOSIS — R110 Nausea: Secondary | ICD-10-CM | POA: Diagnosis not present

## 2020-07-25 DIAGNOSIS — Z885 Allergy status to narcotic agent status: Secondary | ICD-10-CM

## 2020-07-25 DIAGNOSIS — G4733 Obstructive sleep apnea (adult) (pediatric): Secondary | ICD-10-CM | POA: Diagnosis present

## 2020-07-25 DIAGNOSIS — R1013 Epigastric pain: Secondary | ICD-10-CM | POA: Diagnosis not present

## 2020-07-25 DIAGNOSIS — K3184 Gastroparesis: Secondary | ICD-10-CM | POA: Diagnosis present

## 2020-07-25 DIAGNOSIS — K760 Fatty (change of) liver, not elsewhere classified: Secondary | ICD-10-CM | POA: Diagnosis present

## 2020-07-25 DIAGNOSIS — M1611 Unilateral primary osteoarthritis, right hip: Secondary | ICD-10-CM | POA: Diagnosis present

## 2020-07-25 DIAGNOSIS — F1120 Opioid dependence, uncomplicated: Secondary | ICD-10-CM | POA: Diagnosis present

## 2020-07-25 LAB — CBC WITH DIFFERENTIAL/PLATELET
Abs Immature Granulocytes: 0.01 10*3/uL (ref 0.00–0.07)
Basophils Absolute: 0 10*3/uL (ref 0.0–0.1)
Basophils Relative: 1 %
Eosinophils Absolute: 0.1 10*3/uL (ref 0.0–0.5)
Eosinophils Relative: 2 %
HCT: 32.2 % — ABNORMAL LOW (ref 36.0–46.0)
Hemoglobin: 11.2 g/dL — ABNORMAL LOW (ref 12.0–15.0)
Immature Granulocytes: 0 %
Lymphocytes Relative: 30 %
Lymphs Abs: 1.1 10*3/uL (ref 0.7–4.0)
MCH: 32.3 pg (ref 26.0–34.0)
MCHC: 34.8 g/dL (ref 30.0–36.0)
MCV: 92.8 fL (ref 80.0–100.0)
Monocytes Absolute: 0.5 10*3/uL (ref 0.1–1.0)
Monocytes Relative: 12 %
Neutro Abs: 2 10*3/uL (ref 1.7–7.7)
Neutrophils Relative %: 55 %
Platelets: 198 10*3/uL (ref 150–400)
RBC: 3.47 MIL/uL — ABNORMAL LOW (ref 3.87–5.11)
RDW: 13.8 % (ref 11.5–15.5)
WBC: 3.6 10*3/uL — ABNORMAL LOW (ref 4.0–10.5)
nRBC: 0 % (ref 0.0–0.2)

## 2020-07-25 LAB — CBG MONITORING, ED: Glucose-Capillary: 157 mg/dL — ABNORMAL HIGH (ref 70–99)

## 2020-07-25 LAB — URINALYSIS, ROUTINE W REFLEX MICROSCOPIC
Bilirubin Urine: NEGATIVE
Glucose, UA: NEGATIVE mg/dL
Ketones, ur: 20 mg/dL — AB
Nitrite: NEGATIVE
Protein, ur: NEGATIVE mg/dL
Specific Gravity, Urine: 1.016 (ref 1.005–1.030)
pH: 6 (ref 5.0–8.0)

## 2020-07-25 LAB — COMPREHENSIVE METABOLIC PANEL
ALT: 10 U/L (ref 0–44)
AST: 23 U/L (ref 15–41)
Albumin: 3.4 g/dL — ABNORMAL LOW (ref 3.5–5.0)
Alkaline Phosphatase: 77 U/L (ref 38–126)
Anion gap: 15 (ref 5–15)
BUN: 24 mg/dL — ABNORMAL HIGH (ref 8–23)
CO2: 25 mmol/L (ref 22–32)
Calcium: 8.6 mg/dL — ABNORMAL LOW (ref 8.9–10.3)
Chloride: 101 mmol/L (ref 98–111)
Creatinine, Ser: 1.49 mg/dL — ABNORMAL HIGH (ref 0.44–1.00)
GFR calc Af Amer: 41 mL/min — ABNORMAL LOW (ref >60)
GFR calc non Af Amer: 36 mL/min — ABNORMAL LOW (ref >60)
Glucose, Bld: 129 mg/dL — ABNORMAL HIGH (ref 70–99)
Potassium: 3.4 mmol/L — ABNORMAL LOW (ref 3.5–5.1)
Sodium: 141 mmol/L (ref 135–145)
Total Bilirubin: 1.4 mg/dL — ABNORMAL HIGH (ref 0.3–1.2)
Total Protein: 6.2 g/dL — ABNORMAL LOW (ref 6.5–8.1)

## 2020-07-25 LAB — RESPIRATORY PANEL BY RT PCR (FLU A&B, COVID)
Influenza A by PCR: NEGATIVE
Influenza B by PCR: NEGATIVE
SARS Coronavirus 2 by RT PCR: NEGATIVE

## 2020-07-25 LAB — LIPASE, BLOOD: Lipase: 40 U/L (ref 11–51)

## 2020-07-25 LAB — GLUCOSE, CAPILLARY
Glucose-Capillary: 104 mg/dL — ABNORMAL HIGH (ref 70–99)
Glucose-Capillary: 91 mg/dL (ref 70–99)

## 2020-07-25 MED ORDER — FENTANYL CITRATE (PF) 100 MCG/2ML IJ SOLN
50.0000 ug | Freq: Once | INTRAMUSCULAR | Status: AC
  Administered 2020-07-25: 50 ug via INTRAVENOUS
  Filled 2020-07-25: qty 2

## 2020-07-25 MED ORDER — HYDROCODONE-ACETAMINOPHEN 10-325 MG PO TABS
1.0000 | ORAL_TABLET | Freq: Three times a day (TID) | ORAL | Status: DC | PRN
  Administered 2020-07-25 – 2020-07-28 (×6): 1 via ORAL
  Filled 2020-07-25 (×6): qty 1

## 2020-07-25 MED ORDER — SODIUM CHLORIDE 0.9 % IV SOLN: INTRAVENOUS | Status: DC

## 2020-07-25 MED ORDER — OXYBUTYNIN CHLORIDE ER 5 MG PO TB24
15.0000 mg | ORAL_TABLET | Freq: Every day | ORAL | Status: DC
  Administered 2020-07-25 – 2020-07-28 (×4): 15 mg via ORAL
  Filled 2020-07-25 (×4): qty 3

## 2020-07-25 MED ORDER — ONDANSETRON HCL 4 MG/2ML IJ SOLN
4.0000 mg | Freq: Four times a day (QID) | INTRAMUSCULAR | Status: DC | PRN
  Administered 2020-07-25 – 2020-07-27 (×4): 4 mg via INTRAVENOUS
  Filled 2020-07-25 (×4): qty 2

## 2020-07-25 MED ORDER — ONDANSETRON HCL 4 MG PO TABS
4.0000 mg | ORAL_TABLET | Freq: Four times a day (QID) | ORAL | Status: DC | PRN
  Administered 2020-07-26: 4 mg via ORAL
  Filled 2020-07-25: qty 1

## 2020-07-25 MED ORDER — LEFLUNOMIDE 20 MG PO TABS
20.0000 mg | ORAL_TABLET | Freq: Every day | ORAL | Status: DC
  Administered 2020-07-25 – 2020-07-29 (×5): 20 mg via ORAL
  Filled 2020-07-25 (×5): qty 1

## 2020-07-25 MED ORDER — HYDROMORPHONE HCL 1 MG/ML IJ SOLN
0.5000 mg | Freq: Once | INTRAMUSCULAR | Status: AC
  Administered 2020-07-25: 0.5 mg via INTRAVENOUS
  Filled 2020-07-25: qty 1

## 2020-07-25 MED ORDER — ROSUVASTATIN CALCIUM 10 MG PO TABS
10.0000 mg | ORAL_TABLET | Freq: Every day | ORAL | Status: DC
  Administered 2020-07-25 – 2020-07-29 (×5): 10 mg via ORAL
  Filled 2020-07-25 (×5): qty 1

## 2020-07-25 MED ORDER — PROMETHAZINE HCL 25 MG/ML IJ SOLN
12.5000 mg | Freq: Once | INTRAMUSCULAR | Status: AC
  Administered 2020-07-25: 12.5 mg via INTRAVENOUS
  Filled 2020-07-25: qty 1

## 2020-07-25 MED ORDER — INSULIN ASPART 100 UNIT/ML ~~LOC~~ SOLN
0.0000 [IU] | Freq: Three times a day (TID) | SUBCUTANEOUS | Status: DC
  Administered 2020-07-28: 1 [IU] via SUBCUTANEOUS

## 2020-07-25 MED ORDER — HEPARIN SODIUM (PORCINE) 5000 UNIT/ML IJ SOLN
5000.0000 [IU] | Freq: Three times a day (TID) | INTRAMUSCULAR | Status: DC
  Administered 2020-07-25 – 2020-07-29 (×12): 5000 [IU] via SUBCUTANEOUS
  Filled 2020-07-25 (×12): qty 1

## 2020-07-25 MED ORDER — TRAZODONE HCL 100 MG PO TABS
200.0000 mg | ORAL_TABLET | Freq: Once | ORAL | Status: AC
  Administered 2020-07-25: 200 mg via ORAL
  Filled 2020-07-25: qty 2

## 2020-07-25 MED ORDER — SODIUM CHLORIDE 0.9 % IV BOLUS
500.0000 mL | Freq: Once | INTRAVENOUS | Status: AC
  Administered 2020-07-25: 500 mL via INTRAVENOUS

## 2020-07-25 MED ORDER — ONDANSETRON HCL 4 MG/2ML IJ SOLN
4.0000 mg | Freq: Once | INTRAMUSCULAR | Status: AC
  Administered 2020-07-25: 4 mg via INTRAVENOUS
  Filled 2020-07-25: qty 2

## 2020-07-25 MED ORDER — ESCITALOPRAM OXALATE 10 MG PO TABS
10.0000 mg | ORAL_TABLET | Freq: Every day | ORAL | Status: DC
  Administered 2020-07-25 – 2020-07-29 (×5): 10 mg via ORAL
  Filled 2020-07-25 (×5): qty 1

## 2020-07-25 MED ORDER — LACTATED RINGERS IV SOLN: Freq: Once | INTRAVENOUS | Status: AC

## 2020-07-25 MED ORDER — DULOXETINE HCL 30 MG PO CPEP
30.0000 mg | ORAL_CAPSULE | Freq: Every day | ORAL | Status: DC
  Administered 2020-07-25 – 2020-07-29 (×5): 30 mg via ORAL
  Filled 2020-07-25 (×5): qty 1

## 2020-07-25 MED ORDER — PANTOPRAZOLE SODIUM 40 MG IV SOLR
40.0000 mg | INTRAVENOUS | Status: DC
  Administered 2020-07-25 – 2020-07-26 (×2): 40 mg via INTRAVENOUS
  Filled 2020-07-25 (×2): qty 40

## 2020-07-25 NOTE — Plan of Care (Signed)
  Problem: Education: Goal: Knowledge of General Education information will improve Description: Including pain rating scale, medication(s)/side effects and non-pharmacologic comfort measures Outcome: Progressing   Problem: Activity: Goal: Risk for activity intolerance will decrease Outcome: Progressing   

## 2020-07-25 NOTE — Progress Notes (Signed)
Report from ED RN patient has not voided since 3am, bladder scan upon arrival to floor was zero.  Bethann Punches RN

## 2020-07-25 NOTE — ED Triage Notes (Signed)
Pt from home via EMS. Pt reports feeling bad x 2 weeks with poor appetite. Pt reports epigastric and RUQ pain that started last pm. Pt adds nausea that she received 4mg  IV zofran en route. Pt denies emesis but endorses dry heaves. She is A&O x4  VS- 120/98, HR 96, RR 24, 100% RA, CBG 192 Pt DM II and has not been using her insulin since she has not been eating.

## 2020-07-25 NOTE — ED Notes (Signed)
Pure wick was placed at 80 mmHg.  

## 2020-07-25 NOTE — Consult Note (Signed)
Referring Provider:  [facility] Primary Care Physician:  Wardell Honour, MD Primary Gastroenterologist:  Dr. Therisa Doyne  Reason for Consultation:   Dysphagia, abdominal pain and weight loss  HPI: Angel French is a 69 y.o. female being admitted through the emergency room because of a 6-week history of diffuse upper abdominal pain with progressive anorexia, discomfort, dysphagia, and food avoidance to the point where over the past day or 2, she, by her report, has only been able to have minimal p.o. intake, although she can handle her secretions okay.  There is been a 25 pound documented weight loss.    CT scan on admission is normal, and blood work is unremarkable, specifically without evidence of azotemia to suggest dehydration.  A barium swallow was performed in the ER which shows no evidence of esophageal dysmotility, spasm, stricture, or mass, and the barium tablet passed promptly into the stomach.  The patient was seen several years ago by Dr. Therisa Doyne of our practice, because of esophageal symptoms.  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  She also complained to her PCP of rib pain and is scheduled for a rib CT on Monday.   Past Medical History:  Diagnosis Date  . Allergy    generic allergy pill; Spring and Fall only.  . Anxiety   . Arthritis    DDD lumbar, R hip OA.  s/p ortho consult in past.  . Blood transfusion without reported diagnosis    Mountain climbing accident in [place].  . Brachial plexus disorders   . Cataract    B retractions.  . Chronic kidney disease    stage 3 per pt.   . Chronic  pain syndrome   . Chronic renal insufficiency, stage 3 (moderate)   . Constipation   . DDD (degenerative disc disease), lumbar   . Depression   . Diabetes mellitus   . Diabetic peripheral neuropathy associated with type 2 diabetes mellitus ([place])   . Diabetic retinopathy ([place])   . Diabetic retinopathy associated with type 2 diabetes mellitus ([place])    s/p laser treatment multiple.  Unable to drive.  . Fatty liver   . Fibromyalgia   . Food allergy   . GERD (gastroesophageal reflux disease)   . Hypercholesteremia   . Hyperlipidemia   . Hypertension    controlled, off meds   . IBS (irritable bowel syndrome)   . Leg edema   . Neuromuscular disorder ([place])   . OSA (obstructive sleep apnea)   . Osteoarthritis   . Rheumatic fever   . Rheumatoid arthritis ([place])   . Stomach ulcer   . Swallowing difficulty   . TIA (transient ischemic attack)   . Ulcer    Peptic ulcer H. Pylori + s/p treatment.  Upper GI diagnosed.[name] Prilosec PRN .    Past Surgical History:  Procedure Laterality Date  .  2 SPINAL INJECTIONS     . ABDOMINAL HYSTERECTOMY  11/02/1979   DUB; cervical dysplasia; ovaries intact.  . ABDOMINAL SURGERY     staph abcess   . [facility] Ad[facility]     age 43; three months in [place].  Marland Kitchen BREAST BIOPSY    .  CARDIAC CATHETERIZATION  11/02/2007   normal coronary arteries.  . CARPAL TUNNEL RELEASE     Bilateral.  . CATARACT EXTRACTION, BILATERAL    . CHOLECYSTECTOMY    . ESOPHAGEAL MANOMETRY N/A 09/14/2017   Procedure: ESOPHAGEAL MANOMETRY (EM);  Surgeon: Ronnette Juniper, MD;  Location: [facility] ENDOSCOPY;  Service: Gastroenterology;  Laterality: N/A;  . EYE SURGERY     Cataracts B. Laser surgery x 7 for Diabetic Retinopathy  . TONSILLECTOMY      Prior to Admission medications   Medication Sig Start Date End Date Taking? Authorizing Provider  acetaminophen (TYLENOL) 325 MG tablet Take 650 mg by mouth every 6 (six) hours as needed (every 6 weeks before RA infusion).   Yes  [provider]  aspirin EC 81 MG tablet Take 1 tablet (81 mg total) by mouth daily. Swallow whole. 07/10/20  Yes O'Neal, Cassie Freer, MD  diclofenac sodium (VOLTAREN) 1 % GEL Apply 2 g topically 4 (four) times daily. Patient taking differently: Apply 2 g topically 4 (four) times daily as needed (painful joints).  11/05/14  Yes Wardell Honour, MD  diphenhydrAMINE (BENADRYL) 25 MG tablet Take 25 mg by mouth every 6 (six) hours as needed (every 6 weeks prior to RA infusion).   Yes [provider]  DULoxetine (CYMBALTA) 30 MG capsule TAKE 1 CAPSULE BY MOUTH EVERY DAY Patient taking differently: Take 30 mg by mouth daily.  04/19/18  Yes Wardell Honour, MD  escitalopram (LEXAPRO) 20 MG tablet TAKE 1 TABLET BY MOUTH EVERY DAY Patient taking differently: Take 10 mg by mouth daily.  03/28/18  Yes Wardell Honour, MD  famotidine (PEPCID) 40 MG tablet Take 40 mg by mouth at bedtime. 01/20/20  Yes [provider]  furosemide (LASIX) 20 MG tablet Take 1-3 tablets (20-60 mg total) by mouth daily. 04/12/18  Yes Wardell Honour, MD  HYDROcodone-acetaminophen [facility]) 10-325 MG tablet Take 1 tablet by mouth every 12 (twelve) hours as needed. Patient taking differently: Take 1 tablet by mouth every 8 (eight) hours as needed for moderate pain.  04/12/18  Yes Wardell Honour, MD  insulin aspart (NOVOLOG) 100 UNIT/ML injection Inject 25 Units into the skin 3 (three) times daily with meals. Patient taking differently: Inject 30 Units into the skin 3 (three) times daily with meals.  01/03/19  Yes Whitmire, Dawn W, FNP  leflunomide (ARAVA) 20 MG tablet Take 20 mg by mouth daily.  12/10/16  Yes [provider]  LEVEMIR 100 UNIT/ML injection INJECT 60 UNITS AT BEDTIME AS DIRECTED Patient taking differently: Inject 70 Units into the skin at bedtime.  05/31/18  Yes Wardell Honour, MD  linaclotide [facility]) 290 MCG CAPS capsule Take 290 mcg by mouth daily before breakfast.    Yes [provider]  omeprazole (PRILOSEC) 20 MG capsule TAKE 1 CAPSULE BY MOUTH EVERY DAY Patient taking differently: Take 20 mg by mouth daily.  02/23/18  Yes Wardell Honour, MD  oxybutynin (DITROPAN XL) 15 MG 24 hr tablet TAKE 1 TABLET BY MOUTH AT BEDTIME Patient taking differently: Take 15 mg by mouth at bedtime.  04/06/18  Yes Wardell Honour, MD  predniSONE (DELTASONE) 5 MG tablet Take 5 mg by mouth daily as needed. Only takes with RA Flare.   Yes [provider]  rosuvastatin (CRESTOR) 10 MG tablet Take 1 tablet (10 mg total) by mouth daily. 01/09/18  Yes Wardell Honour, MD  traZODone (DESYREL) 100 MG tablet TAKE 2 TABLETS BY MOUTH  EVERY DAY AT BEDTIME Patient taking differently: Take 200 mg by mouth at bedtime.  06/01/18  Yes Wardell Honour, MD  allopurinol (ZYLOPRIM) 100 MG tablet Take 1 tablet (100 mg total) by mouth daily. Ov needed 06/28/17   Wardell Honour, MD  Blood Glucose Monitoring Suppl (BLOOD GLUCOSE METER KIT AND SUPPLIES) KIT Dispense based on patient and insurance preference. Use up to four times daily as directed. (FOR ICD-9 250.00, 250.01). 07/19/14   Gale Journey, Judson Roch L, PA-C  glucose blood test strip Check sugar three times daily  Dx: DMII insulin dependent with retinopathy, neuropathy controlled 05/03/17   Wardell Honour, MD  Insulin Syringes, Disposable, U-100 0.5 ML MISC 28 Units by Does not apply route 2 (two) times daily. 10/30/14   Wardell Honour, MD  Needles & Syringes MISC 1 Syringe by Does not apply route 2 (two) times daily. 12/29/14   Wardell Honour, MD  Flossie Buffy INSULIN SYRINGE 31G X 5/16" 0.5 ML MISC USE AS DIRECTED TO INJECT INSULIN 2 TIMES DAILY 05/01/18   Wardell Honour, MD    Current Facility-Administered Medications  Medication Dose Route Frequency Provider Last Rate Last Admin  . HYDROmorphone (DILAUDID) injection 0.5 mg  0.5 mg Intravenous Once Tacy Learn, PA-C      . promethazine (PHENERGAN) injection 12.5 mg  12.5 mg Intravenous Once Tacy Learn,  PA-C       Current Outpatient Medications  Medication Sig Dispense Refill  . acetaminophen (TYLENOL) 325 MG tablet Take 650 mg by mouth every 6 (six) hours as needed (every 6 weeks before RA infusion).    Marland Kitchen aspirin EC 81 MG tablet Take 1 tablet (81 mg total) by mouth daily. Swallow whole. 90 tablet 3  . diclofenac sodium (VOLTAREN) 1 % GEL Apply 2 g topically 4 (four) times daily. (Patient taking differently: Apply 2 g topically 4 (four) times daily as needed (painful joints). ) 100 g 3  . diphenhydrAMINE (BENADRYL) 25 MG tablet Take 25 mg by mouth every 6 (six) hours as needed (every 6 weeks prior to RA infusion).    . DULoxetine (CYMBALTA) 30 MG capsule TAKE 1 CAPSULE BY MOUTH EVERY DAY (Patient taking differently: Take 30 mg by mouth daily. ) 90 capsule 3  . escitalopram (LEXAPRO) 20 MG tablet TAKE 1 TABLET BY MOUTH EVERY DAY (Patient taking differently: Take 10 mg by mouth daily. ) 90 tablet 1  . famotidine (PEPCID) 40 MG tablet Take 40 mg by mouth at bedtime.    . furosemide (LASIX) 20 MG tablet Take 1-3 tablets (20-60 mg total) by mouth daily. 200 tablet 1  . HYDROcodone-acetaminophen (NORCO) 10-325 MG tablet Take 1 tablet by mouth every 12 (twelve) hours as needed. (Patient taking differently: Take 1 tablet by mouth every 8 (eight) hours as needed for moderate pain. ) 60 tablet 0  . insulin aspart (NOVOLOG) 100 UNIT/ML injection Inject 25 Units into the skin 3 (three) times daily with meals. (Patient taking differently: Inject 30 Units into the skin 3 (three) times daily with meals. ) 10 mL 11  . leflunomide (ARAVA) 20 MG tablet Take 20 mg by mouth daily.     Marland Kitchen LEVEMIR 100 UNIT/ML injection INJECT 60 UNITS AT BEDTIME AS DIRECTED (Patient taking differently: Inject 70 Units into the skin at bedtime. ) 20 mL 1  . linaclotide (LINZESS) 290 MCG CAPS capsule Take 290 mcg by mouth daily before breakfast.     . omeprazole (PRILOSEC) 20 MG capsule TAKE 1  CAPSULE BY MOUTH EVERY DAY (Patient taking  differently: Take 20 mg by mouth daily. ) 90 capsule 3  . oxybutynin (DITROPAN XL) 15 MG 24 hr tablet TAKE 1 TABLET BY MOUTH AT BEDTIME (Patient taking differently: Take 15 mg by mouth at bedtime. ) 90 tablet 3  . predniSONE (DELTASONE) 5 MG tablet Take 5 mg by mouth daily as needed. Only takes with RA Flare.    . rosuvastatin (CRESTOR) 10 MG tablet Take 1 tablet (10 mg total) by mouth daily. 90 tablet 1  . traZODone (DESYREL) 100 MG tablet TAKE 2 TABLETS BY MOUTH EVERY DAY AT BEDTIME (Patient taking differently: Take 200 mg by mouth at bedtime. ) 180 tablet 1  . allopurinol (ZYLOPRIM) 100 MG tablet Take 1 tablet (100 mg total) by mouth daily. Ov needed 90 tablet 3  . Blood Glucose Monitoring Suppl (BLOOD GLUCOSE METER KIT AND SUPPLIES) KIT Dispense based on patient and insurance preference. Use up to four times daily as directed. (FOR ICD-9 250.00, 250.01). 1 each 11  . glucose blood test strip Check sugar three times daily  Dx: DMII insulin dependent with retinopathy, neuropathy controlled 300 each 3  . Insulin Syringes, Disposable, U-100 0.5 ML MISC 28 Units by Does not apply route 2 (two) times daily. 100 each 11  . Needles & Syringes MISC 1 Syringe by Does not apply route 2 (two) times daily. 100 each 11  . ULTICARE INSULIN SYRINGE 31G X 5/16" 0.5 ML MISC USE AS DIRECTED TO INJECT INSULIN 2 TIMES DAILY 100 each 4    Allergies as of 07/25/2020 - Review Complete 07/25/2020  Allergen Reaction Noted  . Codeine Anaphylaxis 04/24/2014  . Contrast media [iodinated diagnostic agents] Anaphylaxis 10/03/2011  . Nitrofurantoin monohyd macro Anaphylaxis 10/03/2011  . Betadine [povidone iodine] Itching 04/13/2016  . Folic acid Itching 74/25/9563  . Gabapentin Other (See Comments) 08/12/2016  . Iodine Hives 10/03/2011  . Lyrica [pregabalin] Other (See Comments) 08/12/2016  . Red dye Itching 01/01/2013  . Ultram [tramadol hcl] Nausea And Vomiting 10/03/2011    Family History  Adopted: Yes  Problem  Relation Age of Onset  . Breast cancer Daughter     Social History   Socioeconomic History  . Marital status: Widowed    Spouse name: Marijean Niemann  . Number of children: 2  . Years of education: college  . Highest education level: Not on file  Occupational History  . Occupation: retired    Comment: retretied  Tobacco Use  . Smoking status: Former Smoker    Years: 22.00  . Smokeless tobacco: Never Used  . Tobacco comment: Quit 1987  Substance and Sexual Activity  . Alcohol use: No    Alcohol/week: 0.0 standard drinks  . Drug use: No  . Sexual activity: Yes    Birth control/protection: Surgical, Post-menopausal    Comment: widow  Other Topics Concern  . Not on file  Social History Narrative   Marital status: widowed since 2009; dating x 6 years.  Happy; no abuse.      Children: 2 children (68 daughter, 35 son estranged); 2 grandchildren.      Lives: with boyfriend, daughter, granddaughter, friend of daughter.  Lives in pt house.      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  Diabetic retinopathy; unable to drive.      Tobacco:  Smoked x 20 years; quit 20 years.      Alcohol:  On special occasions; once per week on average.  Drugs:  None since college.      Exercise:  Walking several times per week; walks the dog.   Education college   Caffeine one cup daily.   Right handed      Advanced Directives: none; FULL CODE.  DNR/DNI.  HCPOA: [place]?           Social Determinants of Health   Financial Resource Strain:   . Difficulty of Paying Living Expenses: Not on file  Food Insecurity:   . Worried About Charity fundraiser in the Last Year: Not on file  . Ran Out of Food in the Last Year: Not on file  Transportation Needs:   . Lack of Transportation (Medical): Not on file  . Lack of Transportation (Non-Medical): Not on file  Physical Activity:   . Days of Exercise per Week: Not on file  . Minutes of Exercise per Session: Not on file   Stress:   . Feeling of Stress : Not on file  Social Connections:   . Frequency of Communication with Friends and Family: Not on file  . Frequency of Social Gatherings with Friends and Family: Not on file  . Attends Religious Services: Not on file  . Active Member of Clubs or Organizations: Not on file  . Attends Archivist Meetings: Not on file  . Marital Status: Not on file  Intimate Partner Violence:   . Fear of Current or Ex-Partner: Not on file  . Emotionally Abused: Not on file  . Physically Abused: Not on file  . Sexually Abused: Not on file    Review of Systems: No chest pain or shortness of breath.  She states she runs chronically constipated; she is laxative dependent, but even with the high dose of Linzess, she only has a bowel movement about once a week.  Physical Exam: Vital signs in last 24 hours: Temp:  [98.2 F (36.8 C)] 98.2 F (36.8 C) (09/24 0822) Pulse Rate:  [81-95] 88 (09/24 1130) Resp:  [8-19] 19 (09/24 1130) BP: (141-164)/(78-80) 146/78 (09/24 1015) SpO2:  [100 %] 100 % (09/24 1130)   General:   Alert,  Well-developed, well-nourished, actually substantially overweight, pleasant and cooperative in NAD Head:  Normocephalic and atraumatic. Eyes:  Sclera clear, no icterus.  Lungs:  Clear throughout to auscultation.   No wheezes, crackles, or rhonchi. No evident respiratory distress. Heart:   Regular rate and rhythm; no murmurs, clicks, rubs,  or gallops. Abdomen:  Soft, nontender, nontympanitic, and nondistended. No masses, hepatosplenomegaly or ventral hernias noted. Normal bowel sounds, without bruits, guarding, or rebound.   Msk:   Symmetrical without gross deformities.  No rib cage tenderness. Extremities:   Without edema. Neurologic:  Alert and coherent;  grossly normal neurologically. Skin:  Intact without significant lesions or rashes. Psych:   Alert and cooperative. Normal mood and affect.  Does not come across as overtly anxious or  depressed.  Intake/Output from previous day: No intake/output data recorded. Intake/Output this shift: No intake/output data recorded.  Lab Results: Recent Labs    07/25/20 0832  WBC 3.6*  HGB 11.2*  HCT 32.2*  PLT 198   BMET Recent Labs    07/25/20 0832  NA 141  K 3.4*  CL 101  CO2 25  GLUCOSE 129*  BUN 24*  CREATININE 1.49*  CALCIUM 8.6*   LFT Recent Labs    07/25/20 0832  PROT 6.2*  ALBUMIN 3.4*  AST 23  ALT 10  ALKPHOS 77  BILITOT 1.4*  PT/INR No results for input(s): LABPROT, INR in the last 72 hours.  Studies/Results: CT Abdomen Pelvis Wo Contrast  Result Date: 07/25/2020 CLINICAL DATA:  Acute abdominal pain, nonlocalized EXAM: CT ABDOMEN AND PELVIS WITHOUT CONTRAST TECHNIQUE: Multidetector CT imaging of the abdomen and pelvis was performed following the standard protocol without IV contrast. COMPARISON:  02/21/2020 FINDINGS: Lower chest: Incidental imaging of the lung bases without consolidation or pleural effusion. Hepatobiliary: No focal, suspicious hepatic abnormality. Post cholecystectomy. No biliary duct dilation. Pancreas: Pancreas without inflammation or contour abnormality. Spleen: Spleen normal in size and contour. Adrenals/Urinary Tract: Adrenal glands are normal. Kidneys without hydronephrosis. Mild cortical scarring. No nephrolithiasis. Stomach/Bowel: Under distension of the colon in the ascending through the transverse colon limiting assessment. Appendix is normal. Stomach under distended limiting assessment. Small bowel normal in course and caliber without signs of adjacent inflammation. Vascular/Lymphatic: Atherosclerotic calcification of the abdominal aorta. There is no gastrohepatic or hepatoduodenal ligament lymphadenopathy. No retroperitoneal or mesenteric lymphadenopathy. No pelvic sidewall lymphadenopathy. Reproductive: Post hysterectomy. Cystic area in the LEFT adnexa. Gas in the vagina. LEFT adnexal cystic area favored to be related to the  ovary based on relationship of vessels measuring 3.3 x 2.1 cm, slightly diminished in size compared to the prior exam. Other: No ascites.  No free air. Musculoskeletal: No acute musculoskeletal process or destructive bone finding. Spinal degenerative changes similar to the prior study. IMPRESSION: 1. Cystic area in the LEFT adnexa not significantly changed from previous imaging. Based on previous ultrasound of the pelvis and current ACR recommendations would suggest nonemergent ultrasound follow-up for further evaluation. 2. Small amount of gas adjacent the vaginal apex without significant surrounding stranding or change since the prior study may represent redundant vaginal cuff. Correlate with any signs of vaginal drainage or pelvic pain. 3. Under distension of the colon, question of mild hypervascularity could be seen in the setting of mild colitis. Correlate with symptoms and history. 4. Post cholecystectomy with normal appendix. Electronically Signed   By: Zetta Bills M.D.   On: 07/25/2020 10:25   DG Chest Port 1 View  Result Date: 07/25/2020 CLINICAL DATA:  Epigastric pain EXAM: PORTABLE CHEST 1 VIEW COMPARISON:  06/23/2020 FINDINGS: The heart size and mediastinal contours are within normal limits. No focal airspace consolidation, pleural effusion, or pneumothorax. The visualized skeletal structures are unremarkable. IMPRESSION: No active disease. Electronically Signed   By: Davina Poke D.O.   On: 07/25/2020 09:35    Impression: 1.  Nonspecific upper tract symptoms with discomfort, anorexia, and food avoidance, without objective abnormalities on radiographic evaluation. 2.  Apparent 25 pound weight loss 3.  Previous radiographic findings suggestive of esophageal dysmotility, not corroborated by esophageal manometry  Plan: Endoscopic evaluation tomorrow.  Petra Kuba, purpose, and risks of the test were reviewed with the patient, who is somewhat familiar with the test from 3 years ago.  She is  agreeable to proceed.  She is aware it will be done by my covering partner.  Further management to depend on those findings.  Soft diet today, n.p.o. after midnight for endoscopy.   LOS: 0 days   Angel French  07/25/2020, 12:16 PM   Pager [phone] If no answer or after 5 PM call [phone]

## 2020-07-25 NOTE — H&P (View-Only) (Signed)
Referring Provider:  [facility] EDP Primary Care Physician:  Wardell Honour, MD Primary Gastroenterologist:  Dr. Therisa Doyne  Reason for Consultation:   Dysphagia, abdominal pain and weight loss  HPI: Angel French is a 69 y.o. female being admitted through the emergency room because of a 6-week history of diffuse upper abdominal pain with progressive anorexia, discomfort, dysphagia, and food avoidance to the point where over the past day or 2, she, by her report, has only been able to have minimal p.o. intake, although she can handle her secretions okay.  There is been a 25 pound documented weight loss.    CT scan on admission is normal, and blood work is unremarkable, specifically without evidence of azotemia to suggest dehydration.  A barium swallow was performed in the ER which shows no evidence of esophageal dysmotility, spasm, stricture, or mass, and the barium tablet passed promptly into the stomach.  The patient was seen several years ago by Dr. Therisa Doyne of our practice, because of esophageal symptoms.  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  She also complained to her PCP of rib pain and is scheduled for a rib CT on Monday.   Past Medical History:  Diagnosis Date   Allergy    generic allergy pill; Spring and Fall only.   Anxiety    Arthritis    DDD lumbar, R hip OA.  s/p ortho consult in past.   Blood transfusion without reported diagnosis    Mountain climbing accident in [place].   Brachial plexus disorders    Cataract    B retractions.   Chronic kidney disease    stage 3 per pt.    Chronic  pain syndrome    Chronic renal insufficiency, stage 3 (moderate)    Constipation    DDD (degenerative disc disease), lumbar    Depression    Diabetes mellitus    Diabetic peripheral neuropathy associated with type 2 diabetes mellitus (HCC)    Diabetic retinopathy ([place])    Diabetic retinopathy associated with type 2 diabetes mellitus ([place])    s/p laser treatment multiple.  Unable to drive.   Fatty liver    Fibromyalgia    Food allergy    GERD (gastroesophageal reflux disease)    Hypercholesteremia    Hyperlipidemia    Hypertension    controlled, off meds    IBS (irritable bowel syndrome)    Leg edema    Neuromuscular disorder (HCC)    OSA (obstructive sleep apnea)    Osteoarthritis    Rheumatic fever    Rheumatoid arthritis (HCC)    Stomach ulcer    Swallowing difficulty    TIA (transient ischemic attack)    Ulcer    Peptic ulcer H. Pylori + s/p treatment.  Upper GI diagnosed.[name] Prilosec PRN .    Past Surgical History:  Procedure Laterality Date    2 SPINAL INJECTIONS      ABDOMINAL HYSTERECTOMY  11/02/1979   DUB; cervical dysplasia; ovaries intact.   ABDOMINAL SURGERY     staph abcess    [facility] Admission     age 25; three months in [place].   BREAST BIOPSY  CARDIAC CATHETERIZATION  11/02/2007   normal coronary arteries.   CARPAL TUNNEL RELEASE     Bilateral.   CATARACT EXTRACTION, BILATERAL     CHOLECYSTECTOMY     ESOPHAGEAL MANOMETRY N/A 09/14/2017   Procedure: ESOPHAGEAL MANOMETRY (EM);  Surgeon: Ronnette Juniper, MD;  Location: [facility] ENDOS[facility];  Service: Gastroenterology;  Laterality: N/A;   EYE SURGERY     Cataracts B. Laser surgery x 7 for Diabetic Retinopathy   TONSILLECTOMY      Prior to Admission medications   Medication Sig Start Date End Date Taking? Authorizing Provider  acetaminophen (TYLENOL) 325 MG tablet Take 650 mg by mouth every 6 (six) hours as needed (every 6 weeks before RA infusion).   Yes  [provider]  aspirin EC 81 MG tablet Take 1 tablet (81 mg total) by mouth daily. Swallow whole. 07/10/20  Yes O'Neal, Cassie Freer, MD  diclofenac sodium (VOLTAREN) 1 % GEL Apply 2 g topically 4 (four) times daily. Patient taking differently: Apply 2 g topically 4 (four) times daily as needed (painful joints).  11/05/14  Yes Wardell Honour, MD  diphenhydrAMINE (BENADRYL) 25 MG tablet Take 25 mg by mouth every 6 (six) hours as needed (every 6 weeks prior to RA infusion).   Yes [provider]  DULoxetine (CYMBALTA) 30 MG capsule TAKE 1 CAPSULE BY MOUTH EVERY DAY Patient taking differently: Take 30 mg by mouth daily.  04/19/18  Yes Wardell Honour, MD  escitalopram (LEXAPRO) 20 MG tablet TAKE 1 TABLET BY MOUTH EVERY DAY Patient taking differently: Take 10 mg by mouth daily.  03/28/18  Yes Wardell Honour, MD  famotidine (PEPCID) 40 MG tablet Take 40 mg by mouth at bedtime. 01/20/20  Yes [provider]  furosemide (LASIX) 20 MG tablet Take 1-3 tablets (20-60 mg total) by mouth daily. 04/12/18  Yes Wardell Honour, MD  HYDROcodone-acetaminophen [facility]) 10-325 MG tablet Take 1 tablet by mouth every 12 (twelve) hours as needed. Patient taking differently: Take 1 tablet by mouth every 8 (eight) hours as needed for moderate pain.  04/12/18  Yes Wardell Honour, MD  insulin aspart (NOVOLOG) 100 UNIT/ML injection Inject 25 Units into the skin 3 (three) times daily with meals. Patient taking differently: Inject 30 Units into the skin 3 (three) times daily with meals.  01/03/19  Yes Whitmire, Dawn W, FNP  leflunomide (ARAVA) 20 MG tablet Take 20 mg by mouth daily.  12/10/16  Yes [provider]  LEVEMIR 100 UNIT/ML injection INJECT 60 UNITS AT BEDTIME AS DIRECTED Patient taking differently: Inject 70 Units into the skin at bedtime.  05/31/18  Yes Wardell Honour, MD  linaclotide [facility]) 290 MCG CAPS capsule Take 290 mcg by mouth daily before breakfast.    Yes [provider]  omeprazole (PRILOSEC) 20 MG capsule TAKE 1 CAPSULE BY MOUTH EVERY DAY Patient taking differently: Take 20 mg by mouth daily.  02/23/18  Yes Wardell Honour, MD  oxybutynin (DITROPAN XL) 15 MG 24 hr tablet TAKE 1 TABLET BY MOUTH AT BEDTIME Patient taking differently: Take 15 mg by mouth at bedtime.  04/06/18  Yes Wardell Honour, MD  predniSONE (DELTASONE) 5 MG tablet Take 5 mg by mouth daily as needed. Only takes with RA Flare.   Yes [provider]  rosuvastatin (CRESTOR) 10 MG tablet Take 1 tablet (10 mg total) by mouth daily. 01/09/18  Yes Wardell Honour, MD  traZODone (DESYREL) 100 MG tablet TAKE 2 TABLETS BY MOUTH  EVERY DAY AT BEDTIME Patient taking differently: Take 200 mg by mouth at bedtime.  06/01/18  Yes Wardell Honour, MD  allopurinol (ZYLOPRIM) 100 MG tablet Take 1 tablet (100 mg total) by mouth daily. Ov needed 06/28/17   Wardell Honour, MD  Blood Glucose Monitoring Suppl (BLOOD GLUCOSE METER KIT AND SUPPLIES) KIT Dispense based on patient and insurance preference. Use up to four times daily as directed. (FOR ICD-9 250.00, 250.01). 07/19/14   Gale Journey, Judson Roch L, PA-C  glucose blood test strip Check sugar three times daily  Dx: DMII insulin dependent with retinopathy, neuropathy controlled 05/03/17   Wardell Honour, MD  Insulin Syringes, Disposable, U-100 0.5 ML MISC 28 Units by Does not apply route 2 (two) times daily. 10/30/14   Wardell Honour, MD  Needles & Syringes MISC 1 Syringe by Does not apply route 2 (two) times daily. 12/29/14   Wardell Honour, MD  Flossie Buffy INSULIN SYRINGE 31G X 5/16" 0.5 ML MISC USE AS DIRECTED TO INJECT INSULIN 2 TIMES DAILY 05/01/18   Wardell Honour, MD    Current Facility-Administered Medications  Medication Dose Route Frequency Provider Last Rate Last Admin   HYDROmorphone (DILAUDID) injection 0.5 mg  0.5 mg Intravenous Once Tacy Learn, PA-C       promethazine (PHENERGAN) injection 12.5 mg  12.5 mg Intravenous Once Tacy Learn,  PA-C       Current Outpatient Medications  Medication Sig Dispense Refill   acetaminophen (TYLENOL) 325 MG tablet Take 650 mg by mouth every 6 (six) hours as needed (every 6 weeks before RA infusion).     aspirin EC 81 MG tablet Take 1 tablet (81 mg total) by mouth daily. Swallow whole. 90 tablet 3   diclofenac sodium (VOLTAREN) 1 % GEL Apply 2 g topically 4 (four) times daily. (Patient taking differently: Apply 2 g topically 4 (four) times daily as needed (painful joints). ) 100 g 3   diphenhydrAMINE (BENADRYL) 25 MG tablet Take 25 mg by mouth every 6 (six) hours as needed (every 6 weeks prior to RA infusion).     DULoxetine (CYMBALTA) 30 MG capsule TAKE 1 CAPSULE BY MOUTH EVERY DAY (Patient taking differently: Take 30 mg by mouth daily. ) 90 capsule 3   escitalopram (LEXAPRO) 20 MG tablet TAKE 1 TABLET BY MOUTH EVERY DAY (Patient taking differently: Take 10 mg by mouth daily. ) 90 tablet 1   famotidine (PEPCID) 40 MG tablet Take 40 mg by mouth at bedtime.     furosemide (LASIX) 20 MG tablet Take 1-3 tablets (20-60 mg total) by mouth daily. 200 tablet 1   HYDROcodone-acetaminophen (NORCO) 10-325 MG tablet Take 1 tablet by mouth every 12 (twelve) hours as needed. (Patient taking differently: Take 1 tablet by mouth every 8 (eight) hours as needed for moderate pain. ) 60 tablet 0   insulin aspart (NOVOLOG) 100 UNIT/ML injection Inject 25 Units into the skin 3 (three) times daily with meals. (Patient taking differently: Inject 30 Units into the skin 3 (three) times daily with meals. ) 10 mL 11   leflunomide (ARAVA) 20 MG tablet Take 20 mg by mouth daily.      LEVEMIR 100 UNIT/ML injection INJECT 60 UNITS AT BEDTIME AS DIRECTED (Patient taking differently: Inject 70 Units into the skin at bedtime. ) 20 mL 1   linaclotide (LINZESS) 290 MCG CAPS capsule Take 290 mcg by mouth daily before breakfast.      omeprazole (PRILOSEC) 20 MG capsule TAKE 1  CAPSULE BY MOUTH EVERY DAY (Patient taking  differently: Take 20 mg by mouth daily. ) 90 capsule 3   oxybutynin (DITROPAN XL) 15 MG 24 hr tablet TAKE 1 TABLET BY MOUTH AT BEDTIME (Patient taking differently: Take 15 mg by mouth at bedtime. ) 90 tablet 3   predniSONE (DELTASONE) 5 MG tablet Take 5 mg by mouth daily as needed. Only takes with RA Flare.     rosuvastatin (CRESTOR) 10 MG tablet Take 1 tablet (10 mg total) by mouth daily. 90 tablet 1   traZODone (DESYREL) 100 MG tablet TAKE 2 TABLETS BY MOUTH EVERY DAY AT BEDTIME (Patient taking differently: Take 200 mg by mouth at bedtime. ) 180 tablet 1   allopurinol (ZYLOPRIM) 100 MG tablet Take 1 tablet (100 mg total) by mouth daily. Ov needed 90 tablet 3   Blood Glucose Monitoring Suppl (BLOOD GLUCOSE METER KIT AND SUPPLIES) KIT Dispense based on patient and insurance preference. Use up to four times daily as directed. (FOR ICD-9 250.00, 250.01). 1 each 11   glucose blood test strip Check sugar three times daily  Dx: DMII insulin dependent with retinopathy, neuropathy controlled 300 each 3   Insulin Syringes, Disposable, U-100 0.5 ML MISC 28 Units by Does not apply route 2 (two) times daily. 100 each 11   Needles & Syringes MISC 1 Syringe by Does not apply route 2 (two) times daily. 100 each 11   ULTICARE INSULIN SYRINGE 31G X 5/16" 0.5 ML MISC USE AS DIRECTED TO INJECT INSULIN 2 TIMES DAILY 100 each 4    Allergies as of 07/25/2020 - Review Complete 07/25/2020  Allergen Reaction Noted   Codeine Anaphylaxis 04/24/2014   Contrast media [iodinated diagnostic agents] Anaphylaxis 10/03/2011   Nitrofurantoin monohyd macro Anaphylaxis 10/03/2011   Betadine [povidone iodine] Itching 35/57/3220   Folic acid Itching 25/42/7062   Gabapentin Other (See Comments) 08/12/2016   Iodine Hives 10/03/2011   Lyrica [pregabalin] Other (See Comments) 08/12/2016   Red dye Itching 01/01/2013   Ultram [tramadol hcl] Nausea And Vomiting 10/03/2011    Family History  Adopted: Yes  Problem  Relation Age of Onset   Breast cancer Daughter     Social History   Socioeconomic History   Marital status: Widowed    Spouse name: Advice worker   Number of children: 2   Years of education: college   Highest education level: Not on file  Occupational History   Occupation: retired    Comment: retretied  Tobacco Use   Smoking status: Former Smoker    Years: 22.00   Smokeless tobacco: Never Used   Tobacco comment: Quit 1987  Substance and Sexual Activity   Alcohol use: No    Alcohol/week: 0.0 standard drinks   Drug use: No   Sexual activity: Yes    Birth control/protection: Surgical, Post-menopausal    Comment: widow  Other Topics Concern   Not on file  Social History Narrative   Marital status: widowed since 2009; dating x 6 years.  Happy; no abuse.      Children: 2 children (70 daughter, 45 son estranged); 2 grandchildren.      Lives: with boyfriend, daughter, granddaughter, friend of daughter.  Lives in pt house.      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  Diabetic retinopathy; unable to drive.      Tobacco:  Smoked x 20 years; quit 20 years.      Alcohol:  On special occasions; once per week on average.  Drugs:  None since college.      Exercise:  Walking several times per week; walks the dog.   Education college   Caffeine one cup daily.   Right handed      Advanced Directives: none; FULL CODE.  DNR/DNI.  HCPOA: [place]?           Social Determinants of Health   Financial Resource Strain:    Difficulty of Paying Living Expenses: Not on file  Food Insecurity:    Worried About Charity fundraiser in the Last Year: Not on file   [employer] of Food in the Last Year: Not on file  Transportation Needs:    Lack of Transportation (Medical): Not on file   Lack of Transportation (Non-Medical): Not on file  Physical Activity:    Days of Exercise per Week: Not on file   Minutes of Exercise per Session: Not on file   Stress:    Feeling of Stress : Not on file  Social Connections:    Frequency of Communication with Friends and Family: Not on file   Frequency of Social Gatherings with Friends and Family: Not on file   Attends Religious Services: Not on file   Active Member of Clubs or Organizations: Not on file   Attends Archivist Meetings: Not on file   Marital Status: Not on file  Intimate Partner Violence:    Fear of Current or Ex-Partner: Not on file   Emotionally Abused: Not on file   Physically Abused: Not on file   Sexually Abused: Not on file    Review of Systems: No chest pain or shortness of breath.  She states she runs chronically constipated; she is laxative dependent, but even with the high dose of Linzess, she only has a bowel movement about once a week.  Physical Exam: Vital signs in last 24 hours: Temp:  [98.2 F (36.8 C)] 98.2 F (36.8 C) (09/24 0822) Pulse Rate:  [81-95] 88 (09/24 1130) Resp:  [8-19] 19 (09/24 1130) BP: (141-164)/(78-80) 146/78 (09/24 1015) SpO2:  [100 %] 100 % (09/24 1130)   General:   Alert,  Well-developed, well-nourished, actually substantially overweight, pleasant and cooperative in NAD Head:  Normocephalic and atraumatic. Eyes:  Sclera clear, no icterus.  Lungs:  Clear throughout to auscultation.   No wheezes, crackles, or rhonchi. No evident respiratory distress. Heart:   Regular rate and rhythm; no murmurs, clicks, rubs,  or gallops. Abdomen:  Soft, nontender, nontympanitic, and nondistended. No masses, hepatosplenomegaly or ventral hernias noted. Normal bowel sounds, without bruits, guarding, or rebound.   Msk:   Symmetrical without gross deformities.  No rib cage tenderness. Extremities:   Without edema. Neurologic:  Alert and coherent;  grossly normal neurologically. Skin:  Intact without significant lesions or rashes. Psych:   Alert and cooperative. Normal mood and affect.  Does not come across as overtly anxious or  depressed.  Intake/Output from previous day: No intake/output data recorded. Intake/Output this shift: No intake/output data recorded.  Lab Results: Recent Labs    07/25/20 0832  WBC 3.6*  HGB 11.2*  HCT 32.2*  PLT 198   BMET Recent Labs    07/25/20 0832  NA 141  K 3.4*  CL 101  CO2 25  GLUCOSE 129*  BUN 24*  CREATININE 1.49*  CALCIUM 8.6*   LFT Recent Labs    07/25/20 0832  PROT 6.2*  ALBUMIN 3.4*  AST 23  ALT 10  ALKPHOS 77  BILITOT 1.4*  PT/INR No results for input(s): LABPROT, INR in the last 72 hours.  Studies/Results: CT Abdomen Pelvis Wo Contrast  Result Date: 07/25/2020 CLINICAL DATA:  Acute abdominal pain, nonlocalized EXAM: CT ABDOMEN AND PELVIS WITHOUT CONTRAST TECHNIQUE: Multidetector CT imaging of the abdomen and pelvis was performed following the standard protocol without IV contrast. COMPARISON:  02/21/2020 FINDINGS: Lower chest: Incidental imaging of the lung bases without consolidation or pleural effusion. Hepatobiliary: No focal, suspicious hepatic abnormality. Post cholecystectomy. No biliary duct dilation. Pancreas: Pancreas without inflammation or contour abnormality. Spleen: Spleen normal in size and contour. Adrenals/Urinary Tract: Adrenal glands are normal. Kidneys without hydronephrosis. Mild cortical scarring. No nephrolithiasis. Stomach/Bowel: Under distension of the colon in the ascending through the transverse colon limiting assessment. Appendix is normal. Stomach under distended limiting assessment. Small bowel normal in course and caliber without signs of adjacent inflammation. Vascular/Lymphatic: Atherosclerotic calcification of the abdominal aorta. There is no gastrohepatic or hepatoduodenal ligament lymphadenopathy. No retroperitoneal or mesenteric lymphadenopathy. No pelvic sidewall lymphadenopathy. Reproductive: Post hysterectomy. Cystic area in the LEFT adnexa. Gas in the vagina. LEFT adnexal cystic area favored to be related to the  ovary based on relationship of vessels measuring 3.3 x 2.1 cm, slightly diminished in size compared to the prior exam. Other: No ascites.  No free air. Musculoskeletal: No acute musculoskeletal process or destructive bone finding. Spinal degenerative changes similar to the prior study. IMPRESSION: 1. Cystic area in the LEFT adnexa not significantly changed from previous imaging. Based on previous ultrasound of the pelvis and current ACR recommendations would suggest nonemergent ultrasound follow-up for further evaluation. 2. Small amount of gas adjacent the vaginal apex without significant surrounding stranding or change since the prior study may represent redundant vaginal cuff. Correlate with any signs of vaginal drainage or pelvic pain. 3. Under distension of the colon, question of mild hypervascularity could be seen in the setting of mild colitis. Correlate with symptoms and history. 4. Post cholecystectomy with normal appendix. Electronically Signed   By: Zetta Bills M.D.   On: 07/25/2020 10:25   DG Chest Port 1 View  Result Date: 07/25/2020 CLINICAL DATA:  Epigastric pain EXAM: PORTABLE CHEST 1 VIEW COMPARISON:  06/23/2020 FINDINGS: The heart size and mediastinal contours are within normal limits. No focal airspace consolidation, pleural effusion, or pneumothorax. The visualized skeletal structures are unremarkable. IMPRESSION: No active disease. Electronically Signed   By: Davina Poke D.O.   On: 07/25/2020 09:35    Impression: 1.  Nonspecific upper tract symptoms with discomfort, anorexia, and food avoidance, without objective abnormalities on radiographic evaluation. 2.  Apparent 25 pound weight loss 3.  Previous radiographic findings suggestive of esophageal dysmotility, not corroborated by esophageal manometry  Plan: Endoscopic evaluation tomorrow.  Petra Kuba, purpose, and risks of the test were reviewed with the patient, who is somewhat familiar with the test from 3 years ago.  She is  agreeable to proceed.  She is aware it will be done by my covering partner.  Further management to depend on those findings.  Soft diet today, n.p.o. after midnight for endoscopy.   LOS: 0 days   Youlanda Mighty Chanda Laperle  07/25/2020, 12:16 PM   Pager [phone] If no answer or after 5 PM call [phone]

## 2020-07-25 NOTE — H&P (Signed)
.  History and Physical    Angel French SEG:[id number] DOB: 1951/06/20 DOA: 07/25/2020  PCP: Wardell Honour, MD  Patient coming from: Home  Chief Complaint: Abdominal pain, anorexia  HPI: Angel French is a 69 y.o. female with medical history significant of RA. Presents with abdominal pain and anorexia. She reports over the last month she has eaten progressively less until she stopped eating altogether yesterday. She reports epigastric abdominal pain initially led to her eating less. That abdominal pain has become more global and it's quality changes from sharp to crampy. The reports some variation of the pain is always with her. Nothing relieves it. She states that she is afraid to eat due to concern for increased pain, but also nausea and a feeling of "stuck" sensation in the throat. She gets this sensation with liquids, solids and pills. Her daughter became concerned and convinced her to come to the ED for evaluation. Of note, she states she's lost 25lbs over this time.    Review of Systems:  Review of systems is otherwise negative for all not mentioned in HPI.   Past Medical History:  Diagnosis Date  . Allergy    generic allergy pill; Spring and Fall only.  . Anxiety   . Arthritis    DDD lumbar, R hip OA.  s/p ortho consult in past.  . Blood transfusion without reported diagnosis    Mountain climbing accident in [place].  . Brachial plexus disorders   . Cataract    B retractions.  . Chronic kidney disease    stage 3 per pt.   . Chronic pain syndrome   . Chronic renal insufficiency, stage 3 (moderate)   . Constipation   . DDD (degenerative disc disease), lumbar   . Depression   . Diabetes mellitus   . Diabetic peripheral neuropathy associated with type 2 diabetes mellitus ([place])   . Diabetic retinopathy ([place])   . Diabetic retinopathy associated with type 2 diabetes mellitus ([place])    s/p laser treatment multiple.  Unable to drive.  . Fatty liver   . Fibromyalgia    . Food allergy   . GERD (gastroesophageal reflux disease)   . Hypercholesteremia   . Hyperlipidemia   . Hypertension    controlled, off meds   . IBS (irritable bowel syndrome)   . Leg edema   . Neuromuscular disorder ([place])   . OSA (obstructive sleep apnea)   . Osteoarthritis   . Rheumatic fever   . Rheumatoid arthritis ([place])   . Stomach ulcer   . Swallowing difficulty   . TIA (transient ischemic attack)   . Ulcer    Peptic ulcer H. Pylori + s/p treatment.  Upper GI diagnosed.[name] Prilosec PRN .    Past Surgical History:  Procedure Laterality Date  .  2 SPINAL INJECTIONS     . ABDOMINAL HYSTERECTOMY  11/02/1979   DUB; cervical dysplasia; ovaries intact.  . ABDOMINAL SURGERY     staph abcess   . [facility] Ad[facility]     age 69; three months in [place].  Marland Kitchen BREAST BIOPSY    . CARDIAC CATHETERIZATION  11/02/2007   normal coronary arteries.  . CARPAL TUNNEL RELEASE     Bilateral.  . CATARACT EXTRACTION, BILATERAL    . CHOLECYSTECTOMY    . ESOPHAGEAL MANOMETRY N/A 09/14/2017   Procedure: ESOPHAGEAL MANOMETRY (EM);  Surgeon: Ronnette Juniper, MD;  Location: [facility];  Service: Gastroenterology;  Laterality: N/A;  . EYE SURGERY  Cataracts B. Laser surgery x 7 for Diabetic Retinopathy  . TONSILLECTOMY       reports that she has quit smoking. She quit after 22.00 years of use. She has never used smokeless tobacco. She reports that she does not drink alcohol and does not use drugs.  Allergies  Allergen Reactions  . Codeine Anaphylaxis  . Contrast Media [Iodinated Diagnostic Agents] Anaphylaxis  . Nitrofurantoin Monohyd Macro Anaphylaxis  . Betadine [Povidone Iodine] Itching  . Folic Acid Itching  . Gabapentin Other (See Comments)    Makes patient feel drunk  . Iodine Hives  . Lyrica [Pregabalin] Other (See Comments)    Makes patient feel drunk  . Red Dye Itching  . Ultram [Tramadol Hcl] Nausea And Vomiting    Family History  Adopted: Yes  Problem  Relation Age of Onset  . Breast cancer Daughter     Prior to Admission medications   Medication Sig Start Date End Date Taking? Authorizing Provider  acetaminophen (TYLENOL) 325 MG tablet Take 650 mg by mouth every 6 (six) hours as needed (every 6 weeks before RA infusion).   Yes [provider]  aspirin EC 81 MG tablet Take 1 tablet (81 mg total) by mouth daily. Swallow whole. 07/10/20  Yes O'Neal, Cassie Freer, MD  diclofenac sodium (VOLTAREN) 1 % GEL Apply 2 g topically 4 (four) times daily. Patient taking differently: Apply 2 g topically 4 (four) times daily as needed (painful joints).  11/05/14  Yes Wardell Honour, MD  diphenhydrAMINE (BENADRYL) 25 MG tablet Take 25 mg by mouth every 6 (six) hours as needed (every 6 weeks prior to RA infusion).   Yes [provider]  DULoxetine (CYMBALTA) 30 MG capsule TAKE 1 CAPSULE BY MOUTH EVERY DAY Patient taking differently: Take 30 mg by mouth daily.  04/19/18  Yes Wardell Honour, MD  escitalopram (LEXAPRO) 20 MG tablet TAKE 1 TABLET BY MOUTH EVERY DAY Patient taking differently: Take 10 mg by mouth daily.  03/28/18  Yes Wardell Honour, MD  famotidine (PEPCID) 40 MG tablet Take 40 mg by mouth at bedtime. 01/20/20  Yes [provider]  furosemide (LASIX) 20 MG tablet Take 1-3 tablets (20-60 mg total) by mouth daily. 04/12/18  Yes Wardell Honour, MD  HYDROcodone-acetaminophen [facility]) 10-325 MG tablet Take 1 tablet by mouth every 12 (twelve) hours as needed. Patient taking differently: Take 1 tablet by mouth every 8 (eight) hours as needed for moderate pain.  04/12/18  Yes Wardell Honour, MD  insulin aspart (NOVOLOG) 100 UNIT/ML injection Inject 25 Units into the skin 3 (three) times daily with meals. Patient taking differently: Inject 30 Units into the skin 3 (three) times daily with meals.  01/03/19  Yes Whitmire, Dawn W, FNP  leflunomide (ARAVA) 20 MG tablet Take 20 mg by mouth daily.  12/10/16  Yes [provider]   LEVEMIR 100 UNIT/ML injection INJECT 60 UNITS AT BEDTIME AS DIRECTED Patient taking differently: Inject 70 Units into the skin at bedtime.  05/31/18  Yes Wardell Honour, MD  linaclotide [facility]) 290 MCG CAPS capsule Take 290 mcg by mouth daily before breakfast.    Yes [provider]  omeprazole (PRILOSEC) 20 MG capsule TAKE 1 CAPSULE BY MOUTH EVERY DAY Patient taking differently: Take 20 mg by mouth daily.  02/23/18  Yes Wardell Honour, MD  oxybutynin (DITROPAN XL) 15 MG 24 hr tablet TAKE 1 TABLET BY MOUTH AT BEDTIME Patient taking differently: Take 15 mg by mouth  at bedtime.  04/06/18  Yes Wardell Honour, MD  predniSONE (DELTASONE) 5 MG tablet Take 5 mg by mouth daily as needed. Only takes with RA Flare.   Yes [provider]  rosuvastatin (CRESTOR) 10 MG tablet Take 1 tablet (10 mg total) by mouth daily. 01/09/18  Yes Wardell Honour, MD  traZODone (DESYREL) 100 MG tablet TAKE 2 TABLETS BY MOUTH EVERY DAY AT BEDTIME Patient taking differently: Take 200 mg by mouth at bedtime.  06/01/18  Yes Wardell Honour, MD  allopurinol (ZYLOPRIM) 100 MG tablet Take 1 tablet (100 mg total) by mouth daily. Ov needed 06/28/17   Wardell Honour, MD  Blood Glucose Monitoring Suppl (BLOOD GLUCOSE METER KIT AND SUPPLIES) KIT Dispense based on patient and insurance preference. Use up to four times daily as directed. (FOR ICD-9 250.00, 250.01). 07/19/14   Gale Journey, Judson Roch L, PA-C  glucose blood test strip Check sugar three times daily  Dx: DMII insulin dependent with retinopathy, neuropathy controlled 05/03/17   Wardell Honour, MD  Insulin Syringes, Disposable, U-100 0.5 ML MISC 28 Units by Does not apply route 2 (two) times daily. 10/30/14   Wardell Honour, MD  Needles & Syringes MISC 1 Syringe by Does not apply route 2 (two) times daily. 12/29/14   Wardell Honour, MD  Flossie Buffy INSULIN SYRINGE 31G X 5/16" 0.5 ML MISC USE AS DIRECTED TO INJECT INSULIN 2 TIMES DAILY 05/01/18   Wardell Honour, MD    Physical  Exam: Vitals:   07/25/20 1226 07/25/20 1330 07/25/20 1541 07/25/20 1602  BP: (!) 144/76 139/72 (!) 142/83 (!) 154/90  Pulse: 90 81 89 99  Resp: _0 Temp:    98.7 F (37.1 C)  TempSrc:    Oral  SpO2: 100% 100% 95% 100%  Weight:    73.5 kg  Height:    5' 2" (1.575 m)    General: 69 y.o. female resting in bed in NAD Eyes: PERRL, normal sclera ENMT: Nares patent w/o discharge, orophaynx clear, dentition normal, ears w/o discharge/lesions/ulcers Neck: Supple, trachea midline Cardiovascular: RRR, +S1, S2, no m/g/r, equal pulses throughout Respiratory: CTABL, no w/r/r, normal WOB GI: BS+, ND, RUQ/LUQ TTP, no masses noted, no organomegaly noted MSK: No e/c/c Skin: No rashes, bruises, ulcerations noted Neuro: A&O x 3, no focal deficits Psyc: Appropriate interaction and affect, calm/cooperative  Labs on Admission: I have personally reviewed following labs and imaging studies  CBC: Recent Labs  Lab 07/25/20 0832  WBC 3.6*  NEUTROABS 2.0  HGB 11.2*  HCT 32.2*  MCV 92.8  PLT 096   Basic Metabolic Panel: Recent Labs  Lab 07/25/20 0832  NA 141  K 3.4*  CL 101  CO2 25  GLUCOSE 129*  BUN 24*  CREATININE 1.49*  CALCIUM 8.6*   GFR: Estimated Creatinine Clearance: 33.9 mL/min (A) (by C-G formula based on SCr of 1.49 mg/dL (H)). Liver Function Tests: Recent Labs  Lab 07/25/20 0832  AST 23  ALT 10  ALKPHOS 77  BILITOT 1.4*  PROT 6.2*  ALBUMIN 3.4*   Recent Labs  Lab 07/25/20 0832  LIPASE 40   No results for input(s): AMMONIA in the last 168 hours. Coagulation Profile: No results for input(s): INR, PROTIME in the last 168 hours. Cardiac Enzymes: No results for input(s): CKTOTAL, CKMB, CKMBINDEX, TROPONINI in the last 168 hours. BNP (last 3 results) No results for input(s): PROBNP in the last 8760 hours. HbA1C: No results for input(s): HGBA1C in  the last 72 hours. CBG: Recent Labs  Lab 07/25/20 0846  GLUCAP 157*   Lipid Profile: No results for  input(s): CHOL, HDL, LDLCALC, TRIG, CHOLHDL, LDLDIRECT in the last 72 hours. Thyroid Function Tests: No results for input(s): TSH, T4TOTAL, FREET4, T3FREE, THYROIDAB in the last 72 hours. Anemia Panel: No results for input(s): VITAMINB12, FOLATE, FERRITIN, TIBC, IRON, RETICCTPCT in the last 72 hours. Urine analysis:    Component Value Date/Time   COLORURINE YELLOW 02/21/2020 1834   APPEARANCEUR CLEAR 02/21/2020 1834   APPEARANCEUR Cloudy (A) 06/28/2017 1803   LABSPEC 1.005 02/21/2020 1834   PHURINE 6.0 02/21/2020 1834   GLUCOSEU NEGATIVE 02/21/2020 1834   HGBUR NEGATIVE 02/21/2020 1834   BILIRUBINUR NEGATIVE 02/21/2020 1834   BILIRUBINUR negative 04/12/2018 1251   BILIRUBINUR Negative 06/28/2017 1803   KETONESUR NEGATIVE 02/21/2020 1834   PROTEINUR NEGATIVE 02/21/2020 1834   UROBILINOGEN 0.2 04/12/2018 1251   NITRITE NEGATIVE 02/21/2020 1834   LEUKOCYTESUR NEGATIVE 02/21/2020 1834    Radiological Exams on Admission: CT Abdomen Pelvis Wo Contrast  Result Date: 07/25/2020 CLINICAL DATA:  Acute abdominal pain, nonlocalized EXAM: CT ABDOMEN AND PELVIS WITHOUT CONTRAST TECHNIQUE: Multidetector CT imaging of the abdomen and pelvis was performed following the standard protocol without IV contrast. COMPARISON:  02/21/2020 FINDINGS: Lower chest: Incidental imaging of the lung bases without consolidation or pleural effusion. Hepatobiliary: No focal, suspicious hepatic abnormality. Post cholecystectomy. No biliary duct dilation. Pancreas: Pancreas without inflammation or contour abnormality. Spleen: Spleen normal in size and contour. Adrenals/Urinary Tract: Adrenal glands are normal. Kidneys without hydronephrosis. Mild cortical scarring. No nephrolithiasis. Stomach/Bowel: Under distension of the colon in the ascending through the transverse colon limiting assessment. Appendix is normal. Stomach under distended limiting assessment. Small bowel normal in course and caliber without signs of adjacent  inflammation. Vascular/Lymphatic: Atherosclerotic calcification of the abdominal aorta. There is no gastrohepatic or hepatoduodenal ligament lymphadenopathy. No retroperitoneal or mesenteric lymphadenopathy. No pelvic sidewall lymphadenopathy. Reproductive: Post hysterectomy. Cystic area in the LEFT adnexa. Gas in the vagina. LEFT adnexal cystic area favored to be related to the ovary based on relationship of vessels measuring 3.3 x 2.1 cm, slightly diminished in size compared to the prior exam. Other: No ascites.  No free air. Musculoskeletal: No acute musculoskeletal process or destructive bone finding. Spinal degenerative changes similar to the prior study. IMPRESSION: 1. Cystic area in the LEFT adnexa not significantly changed from previous imaging. Based on previous ultrasound of the pelvis and current ACR recommendations would suggest nonemergent ultrasound follow-up for further evaluation. 2. Small amount of gas adjacent the vaginal apex without significant surrounding stranding or change since the prior study may represent redundant vaginal cuff. Correlate with any signs of vaginal drainage or pelvic pain. 3. Under distension of the colon, question of mild hypervascularity could be seen in the setting of mild colitis. Correlate with symptoms and history. 4. Post cholecystectomy with normal appendix. Electronically Signed   By: Zetta Bills M.D.   On: 07/25/2020 10:25   DG Chest Port 1 View  Result Date: 07/25/2020 CLINICAL DATA:  Epigastric pain EXAM: PORTABLE CHEST 1 VIEW COMPARISON:  06/23/2020 FINDINGS: The heart size and mediastinal contours are within normal limits. No focal airspace consolidation, pleural effusion, or pneumothorax. The visualized skeletal structures are unremarkable. IMPRESSION: No active disease. Electronically Signed   By: Davina Poke D.O.   On: 07/25/2020 09:35   DG ESOPHAGUS W SINGLE CM (SOL OR THIN BA)  Result Date: 07/25/2020 CLINICAL DATA:  Dysphagia.  Nausea  after  eating/drinking. EXAM: ESOPHOGRAM/BARIUM SWALLOW TECHNIQUE: Single contrast examination was performed using  thin barium. FLUOROSCOPY TIME:  Fluoroscopy Time:  2 minutes and 36 seconds. Radiation Exposure Index (if provided by the fluoroscopic device): 86 mGy Number of Acquired Spot Images: COMPARISON:  None. FINDINGS: Limited study due to patient immobility. Patient was placed on the fluoro table which was moved to a 45-60 degree head up position with assessment performed with the patient in both oblique positions. Patient was given sips of thin barium by mouth and monitored fluoroscopically while swallowing. She did have some discomfort on the initial swallowing phase with barium, but not so much with water. No evidence for gross esophageal mass lesion, diverticulum, or gross mucosal ulceration. No stricture evident. Patient was given a 13 mm barium tablet. This passed into the stomach after repeated swallows of water. IMPRESSION: No gross esophageal abnormality identified on this limited single contrast barium esophagram. Electronically Signed   By: Eric  Mansell M.D.   On: 07/25/2020 13:20   Assessment/Plan Dysphagia Abdominal Pain Anorexia Unintentional Weight Loss     - admit to obs     - GI consulted, appreciate assistance, endoscopy tomorrow; NPO after midnight     - zofran, protonix, CLD, IVF     - dietician consult     - imaging and swallow study unremarkable  CKD 3b     - at baseline     - watch nephrotoxins  RA     - takes PRN steroids; follow for now  DM2     - has not taken her DM meds in a week d/t lack of appetite/poor PO intake     - check A1c     - placed on senisative SSI  Anxiety/Depression     - continue lexapro/cymbalta  DVT prophylaxis: Heparin  Code Status: FULL  Family Communication: None at bedside  Consults called: GI  Admission status: Observation d/t poor PO intake/difficulty w/ PO intake and ongoing diagnostic w/u not appropriate for outpt.  Status  is: Observation  The patient remains OBS appropriate and will d/c before 2 midnights.  Dispo: The patient is from: Home              Anticipated d/c is to: Home              Anticipated d/c date is: 1 day              Patient currently is not medically stable to d/c.   A  DO [facility]  If 7PM-7AM, please contact night-coverage [email or web address]  07/25/2020, 4:11 PM    

## 2020-07-25 NOTE — ED Provider Notes (Signed)
[facility] DEPT Provider Note   CSN: [id number] Arrival date & time: 07/25/20  6761     History Chief Complaint  Patient presents with  . Abdominal Pain    Angel French is a 69 y.o. female.  69 year old female brought in by EMS from home for abdominal pain. Patient reports left side abdominal pain that radiates to right side, progressively worsening, associated with nausea and dry heaves, loss of appetite, difficulty swallowing, decreased urinary output without dysuria. Patient has not been able to take her pills for the past few days due to difficulty swallowing. States she only ate 1 ritz cracker yesterday and it took her all day to eat it due to the difficulty swallowing. Reports baseline constipation, passed a small amount of pink tinged mucous the other day, otherwise has not had a bowel movement in 2 weeks.  Patient states symptoms started about a month or so ago, progressively worsening, reports approximately 25lbs unintentional weight loss over the past few months.         Past Medical History:  Diagnosis Date  . Allergy    generic allergy pill; Spring and Fall only.  . Anxiety   . Arthritis    DDD lumbar, R hip OA.  s/p ortho consult in past.  . Blood transfusion without reported diagnosis    Mountain climbing accident in [place].  . Brachial plexus disorders   . Cataract    B retractions.  . Chronic kidney disease    stage 3 per pt.   . Chronic pain syndrome   . Chronic renal insufficiency, stage 3 (moderate)   . Constipation   . DDD (degenerative disc disease), lumbar   . Depression   . Diabetes mellitus   . Diabetic peripheral neuropathy associated with type 2 diabetes mellitus ([place])   . Diabetic retinopathy ([place])   . Diabetic retinopathy associated with type 2 diabetes mellitus ([place])    s/p laser treatment multiple.  Unable to drive.  . Fatty liver   . Fibromyalgia   . Food allergy   . GERD (gastroesophageal reflux  disease)   . Hypercholesteremia   . Hyperlipidemia   . Hypertension    controlled, off meds   . IBS (irritable bowel syndrome)   . Leg edema   . Neuromuscular disorder ([place])   . OSA (obstructive sleep apnea)   . Osteoarthritis   . Rheumatic fever   . Rheumatoid arthritis ([place])   . Stomach ulcer   . Swallowing difficulty   . TIA (transient ischemic attack)   . Ulcer    Peptic ulcer H. Pylori + s/p treatment.  Upper GI diagnosed.[name] Prilosec PRN .    Patient Active Problem List   Diagnosis Date Noted  . Vitamin D deficiency 01/03/2019  . Visual changes 04/11/2018  . Paresthesia 04/11/2018  . OSA (obstructive sleep apnea) 09/09/2017  . Nocturnal hypoxemia due to obesity 09/09/2017  . Type 2 diabetes mellitus with diabetic polyneuropathy, with long-term current use of insulin ([place]) 09/09/2017  . Hypersomnia with sleep apnea 07/21/2017  . Elevated blood uric acid level 01/25/2017  . Plantar fasciitis of left foot 01/25/2017  . Venous stasis 08/23/2016  . Rheumatoid arthritis involving both hands with positive rheumatoid factor ([place]) 10/30/2015  . Chronic pain syndrome 01/22/2015  . Chronic renal insufficiency 01/22/2015  . Pure hypercholesterolemia 01/01/2013  . Essential hypertension, benign 01/01/2013  . Degenerative disc disease, lumbar 01/01/2013  . Depression 01/01/2013  . Osteoarthritis of right  hip 01/01/2013  . Diabetic peripheral neuropathy associated with type 2 diabetes mellitus ([place]) 01/01/2013  . Diabetic retinopathy ([place]) 01/01/2013  . Obesity 01/01/2013    Past Surgical History:  Procedure Laterality Date  .  2 SPINAL INJECTIONS     . ABDOMINAL HYSTERECTOMY  11/02/1979   DUB; cervical dysplasia; ovaries intact.  . ABDOMINAL SURGERY     staph abcess   . [facility] Ad[facility]     age 18; three months in [place].  Marland Kitchen BREAST BIOPSY    . CARDIAC CATHETERIZATION  11/02/2007   normal coronary arteries.  . CARPAL TUNNEL RELEASE     Bilateral.  .  CATARACT EXTRACTION, BILATERAL    . CHOLECYSTECTOMY    . ESOPHAGEAL MANOMETRY N/A 09/14/2017   Procedure: ESOPHAGEAL MANOMETRY (EM);  Surgeon: Ronnette Juniper, MD;  Location: [facility] ENDOSCOPY;  Service: Gastroenterology;  Laterality: N/A;  . EYE SURGERY     Cataracts B. Laser surgery x 7 for Diabetic Retinopathy  . TONSILLECTOMY       OB History    Gravida  1   Para      Term      Preterm      AB      Living  1     SAB      TAB      Ectopic      Multiple      Live Births              Family History  Adopted: Yes  Problem Relation Age of Onset  . Breast cancer Daughter     Social History   Tobacco Use  . Smoking status: Former Smoker    Years: 22.00  . Smokeless tobacco: Never Used  . Tobacco comment: Quit 1987  Substance Use Topics  . Alcohol use: No    Alcohol/week: 0.0 standard drinks  . Drug use: No    Home Medications Prior to Admission medications   Medication Sig Start Date End Date Taking? Authorizing Provider  acetaminophen (TYLENOL) 325 MG tablet Take 650 mg by mouth every 6 (six) hours as needed (every 6 weeks before RA infusion).   Yes [provider]  aspirin EC 81 MG tablet Take 1 tablet (81 mg total) by mouth daily. Swallow whole. 07/10/20  Yes O'Neal, Cassie Freer, MD  diclofenac sodium (VOLTAREN) 1 % GEL Apply 2 g topically 4 (four) times daily. Patient taking differently: Apply 2 g topically 4 (four) times daily as needed (painful joints).  11/05/14  Yes Wardell Honour, MD  diphenhydrAMINE (BENADRYL) 25 MG tablet Take 25 mg by mouth every 6 (six) hours as needed (every 6 weeks prior to RA infusion).   Yes [provider]  DULoxetine (CYMBALTA) 30 MG capsule TAKE 1 CAPSULE BY MOUTH EVERY DAY Patient taking differently: Take 30 mg by mouth daily.  04/19/18  Yes Wardell Honour, MD  escitalopram (LEXAPRO) 20 MG tablet TAKE 1 TABLET BY MOUTH EVERY DAY Patient taking differently: Take 10 mg by mouth daily.  03/28/18  Yes Wardell Honour, MD  famotidine (PEPCID) 40 MG tablet Take 40 mg by mouth at bedtime. 01/20/20  Yes [provider]  furosemide (LASIX) 20 MG tablet Take 1-3 tablets (20-60 mg total) by mouth daily. 04/12/18  Yes Wardell Honour, MD  HYDROcodone-acetaminophen [facility]) 10-325 MG tablet Take 1 tablet by mouth every 12 (twelve) hours as needed. Patient taking differently: Take 1 tablet by mouth every 8 (eight) hours as  needed for moderate pain.  04/12/18  Yes Wardell Honour, MD  insulin aspart (NOVOLOG) 100 UNIT/ML injection Inject 25 Units into the skin 3 (three) times daily with meals. Patient taking differently: Inject 30 Units into the skin 3 (three) times daily with meals.  01/03/19  Yes Whitmire, Dawn W, FNP  leflunomide (ARAVA) 20 MG tablet Take 20 mg by mouth daily.  12/10/16  Yes [provider]  LEVEMIR 100 UNIT/ML injection INJECT 60 UNITS AT BEDTIME AS DIRECTED Patient taking differently: Inject 70 Units into the skin at bedtime.  05/31/18  Yes Wardell Honour, MD  linaclotide [facility]) 290 MCG CAPS capsule Take 290 mcg by mouth daily before breakfast.    Yes [provider]  omeprazole (PRILOSEC) 20 MG capsule TAKE 1 CAPSULE BY MOUTH EVERY DAY Patient taking differently: Take 20 mg by mouth daily.  02/23/18  Yes Wardell Honour, MD  oxybutynin (DITROPAN XL) 15 MG 24 hr tablet TAKE 1 TABLET BY MOUTH AT BEDTIME Patient taking differently: Take 15 mg by mouth at bedtime.  04/06/18  Yes Wardell Honour, MD  predniSONE (DELTASONE) 5 MG tablet Take 5 mg by mouth daily as needed. Only takes with RA Flare.   Yes [provider]  rosuvastatin (CRESTOR) 10 MG tablet Take 1 tablet (10 mg total) by mouth daily. 01/09/18  Yes Wardell Honour, MD  traZODone (DESYREL) 100 MG tablet TAKE 2 TABLETS BY MOUTH EVERY DAY AT BEDTIME Patient taking differently: Take 200 mg by mouth at bedtime.  06/01/18  Yes Wardell Honour, MD  allopurinol (ZYLOPRIM) 100 MG tablet Take 1 tablet (100 mg total)  by mouth daily. Ov needed 06/28/17   Wardell Honour, MD  Blood Glucose Monitoring Suppl (BLOOD GLUCOSE METER KIT AND SUPPLIES) KIT Dispense based on patient and insurance preference. Use up to four times daily as directed. (FOR ICD-9 250.00, 250.01). 07/19/14   Gale Journey, Judson Roch L, PA-C  glucose blood test strip Check sugar three times daily  Dx: DMII insulin dependent with retinopathy, neuropathy controlled 05/03/17   Wardell Honour, MD  Insulin Syringes, Disposable, U-100 0.5 ML MISC 28 Units by Does not apply route 2 (two) times daily. 10/30/14   Wardell Honour, MD  Needles & Syringes MISC 1 Syringe by Does not apply route 2 (two) times daily. 12/29/14   Wardell Honour, MD  Flossie Buffy INSULIN SYRINGE 31G X 5/16" 0.5 ML MISC USE AS DIRECTED TO INJECT INSULIN 2 TIMES DAILY 05/01/18   Wardell Honour, MD    Allergies    Codeine, Contrast media [iodinated diagnostic agents], Nitrofurantoin monohyd macro, Betadine [povidone iodine], Folic acid, Gabapentin, Iodine, Lyrica [pregabalin], Red dye, and Ultram [tramadol hcl]  Review of Systems   Review of Systems  Constitutional: Positive for appetite change and unexpected weight change. Negative for chills, diaphoresis and fever.  HENT: Positive for trouble swallowing. Negative for sore throat.   Respiratory: Negative for shortness of breath.   Cardiovascular: Negative for chest pain.  Gastrointestinal: Positive for abdominal pain, constipation, nausea and vomiting. Negative for diarrhea.  Genitourinary: Positive for decreased urine volume. Negative for dysuria and frequency.  Musculoskeletal: Negative for back pain.  Skin: Negative for rash and wound.  Allergic/Immunologic: Positive for immunocompromised state.  Neurological: Negative for dizziness and weakness.  Hematological: Negative for adenopathy.  Psychiatric/Behavioral: Negative for confusion.  All other systems reviewed and are negative.   Physical Exam Updated Vital Signs BP 139/72 (BP  Location: Left Arm)  Pulse 81   Temp 98.2 F (36.8 C) (Oral)   Resp 17   SpO2 100%   Physical Exam Vitals and nursing note reviewed.  Constitutional:      General: She is not in acute distress.    Appearance: She is well-developed. She is obese. She is not diaphoretic.  HENT:     Head: Normocephalic and atraumatic.  Cardiovascular:     Rate and Rhythm: Normal rate and regular rhythm.     Heart sounds: Normal heart sounds. No murmur heard.   Pulmonary:     Effort: Pulmonary effort is normal.     Breath sounds: Normal breath sounds.  Abdominal:     Palpations: Abdomen is soft.     Tenderness: There is generalized abdominal tenderness.  Skin:    General: Skin is warm and dry.     Findings: No erythema or rash.  Neurological:     Mental Status: She is alert and oriented to person, place, and time.  Psychiatric:        Behavior: Behavior normal.     ED Results / Procedures / Treatments   Labs (all labs ordered are listed, but only abnormal results are displayed) Labs Reviewed  CBC WITH DIFFERENTIAL/PLATELET - Abnormal; Notable for the following components:      Result Value   WBC 3.6 (*)    RBC 3.47 (*)    Hemoglobin 11.2 (*)    HCT 32.2 (*)    All other components within normal limits  COMPREHENSIVE METABOLIC PANEL - Abnormal; Notable for the following components:   Potassium 3.4 (*)    Glucose, Bld 129 (*)    BUN 24 (*)    Creatinine, Ser 1.49 (*)    Calcium 8.6 (*)    Total Protein 6.2 (*)    Albumin 3.4 (*)    Total Bilirubin 1.4 (*)    GFR calc non Af Amer 36 (*)    GFR calc Af Amer 41 (*)    All other components within normal limits  CBG MONITORING, ED - Abnormal; Notable for the following components:   Glucose-Capillary 157 (*)    All other components within normal limits  RESPIRATORY PANEL BY RT PCR (FLU A&B, COVID)  LIPASE, BLOOD  URINALYSIS, ROUTINE W REFLEX MICROSCOPIC    EKG None  Radiology CT Abdomen Pelvis Wo Contrast  Result Date:  07/25/2020 CLINICAL DATA:  Acute abdominal pain, nonlocalized EXAM: CT ABDOMEN AND PELVIS WITHOUT CONTRAST TECHNIQUE: Multidetector CT imaging of the abdomen and pelvis was performed following the standard protocol without IV contrast. COMPARISON:  02/21/2020 FINDINGS: Lower chest: Incidental imaging of the lung bases without consolidation or pleural effusion. Hepatobiliary: No focal, suspicious hepatic abnormality. Post cholecystectomy. No biliary duct dilation. Pancreas: Pancreas without inflammation or contour abnormality. Spleen: Spleen normal in size and contour. Adrenals/Urinary Tract: Adrenal glands are normal. Kidneys without hydronephrosis. Mild cortical scarring. No nephrolithiasis. Stomach/Bowel: Under distension of the colon in the ascending through the transverse colon limiting assessment. Appendix is normal. Stomach under distended limiting assessment. Small bowel normal in course and caliber without signs of adjacent inflammation. Vascular/Lymphatic: Atherosclerotic calcification of the abdominal aorta. There is no gastrohepatic or hepatoduodenal ligament lymphadenopathy. No retroperitoneal or mesenteric lymphadenopathy. No pelvic sidewall lymphadenopathy. Reproductive: Post hysterectomy. Cystic area in the LEFT adnexa. Gas in the vagina. LEFT adnexal cystic area favored to be related to the ovary based on relationship of vessels measuring 3.3 x 2.1 cm, slightly diminished in size compared to the prior exam. Other:  No ascites.  No free air. Musculoskeletal: No acute musculoskeletal process or destructive bone finding. Spinal degenerative changes similar to the prior study. IMPRESSION: 1. Cystic area in the LEFT adnexa not significantly changed from previous imaging. Based on previous ultrasound of the pelvis and current ACR recommendations would suggest nonemergent ultrasound follow-up for further evaluation. 2. Small amount of gas adjacent the vaginal apex without significant surrounding stranding or  change since the prior study may represent redundant vaginal cuff. Correlate with any signs of vaginal drainage or pelvic pain. 3. Under distension of the colon, question of mild hypervascularity could be seen in the setting of mild colitis. Correlate with symptoms and history. 4. Post cholecystectomy with normal appendix. Electronically Signed   By: Zetta Bills M.D.   On: 07/25/2020 10:25   DG Chest Port 1 View  Result Date: 07/25/2020 CLINICAL DATA:  Epigastric pain EXAM: PORTABLE CHEST 1 VIEW COMPARISON:  06/23/2020 FINDINGS: The heart size and mediastinal contours are within normal limits. No focal airspace consolidation, pleural effusion, or pneumothorax. The visualized skeletal structures are unremarkable. IMPRESSION: No active disease. Electronically Signed   By: Davina Poke D.O.   On: 07/25/2020 09:35   DG ESOPHAGUS W SINGLE CM (SOL OR THIN BA)  Result Date: 07/25/2020 CLINICAL DATA:  Dysphagia.  Nausea after eating/drinking. EXAM: ESOPHOGRAM/BARIUM SWALLOW TECHNIQUE: Single contrast examination was performed using  thin barium. FLUOROSCOPY TIME:  Fluoroscopy Time:  2 minutes and 36 seconds. Radiation Exposure Index (if provided by the fluoroscopic device): 86 mGy Number of Acquired Spot Images: COMPARISON:  None. FINDINGS: Limited study due to patient immobility. Patient was placed on the fluoro table which was moved to a 45-60 degree head up position with assessment performed with the patient in both oblique positions. Patient was given sips of thin barium by mouth and monitored fluoroscopically while swallowing. She did have some discomfort on the initial swallowing phase with barium, but not so much with water. No evidence for gross esophageal mass lesion, diverticulum, or gross mucosal ulceration. No stricture evident. Patient was given a 13 mm barium tablet. This passed into the stomach after repeated swallows of water. IMPRESSION: No gross esophageal abnormality identified on this  limited single contrast barium esophagram. Electronically Signed   By: Misty Stanley M.D.   On: 07/25/2020 13:20    Procedures Procedures (including critical care time)  Medications Ordered in ED Medications  sodium chloride 0.9 % bolus 500 mL (0 mLs Intravenous Stopped 07/25/20 1351)  ondansetron (ZOFRAN) injection 4 mg (4 mg Intravenous Given 07/25/20 1022)  fentaNYL (SUBLIMAZE) injection 50 mcg (50 mcg Intravenous Given 07/25/20 1024)  HYDROmorphone (DILAUDID) injection 0.5 mg (0.5 mg Intravenous Given 07/25/20 1306)  promethazine (PHENERGAN) injection 12.5 mg (12.5 mg Intravenous Given 07/25/20 1303)    ED Course  I have reviewed the triage vital signs and the nursing notes.  Pertinent labs & imaging results that were available during my care of the patient were reviewed by me and considered in my medical decision making (see chart for details).  Clinical Course as of Jul 25 1356  Fri Jul 25, 2020  1141 69yo female with complaint of difficulty swallowing, abdominal pain, nausea/dry heaves. Symptoms started about 1 month ago, progressively worsening to the point that she is not unable to take her medications at home. On exam, generalized abdominal tenderness, appears to feel unwell but not toxic. CT without acute findings to explain patient's symptoms, CXR without acute findings. Labs without significant changes from prior. Patient was given  IV fluids, fentanyl and Zofran. Patient states she continues to feel unwell.  Attempted to drink a sip of water, patient appeared to panic, held the water in her cheeks and when an emesis basin was not rapidly available, was able to swallow the sip of water. Patient states she then felt incredibly nauseous.  Patient is able to tolerate secretions.      [LM]  1146 Discussed with Dr. Tomi Bamberger, ER attending, plan is to consult GI. Patient sees Dr. Therisa Doyne with [facility].    [LM]  1154 Discussed with Dr. Cristina Gong with [facility], needs barium swallow. If can't  swallow barium for swallow, needs to be admitted and consider endoscopy.  Chest CT if unable to tolerate barium swallow.    [LM]  2761 Case discussed with hospitalist service who will consult for admission.    [LM]    Clinical Course User Index [LM] Roque Lias   MDM Rules/Calculators/A&P                          Final Clinical Impression(s) / ED Diagnoses Final diagnoses:  Dysphagia  Generalized abdominal pain    Rx / DC Orders ED Discharge Orders    None       Tacy Learn, PA-C 07/25/20 1357    Dorie Rank, MD 07/25/20 1606

## 2020-07-26 ENCOUNTER — Encounter (HOSPITAL_COMMUNITY)
Admission: EM | Disposition: A | Discharge status: Home Health | Payer: Self-pay | Source: Home / Self Care | Attending: Internal Medicine

## 2020-07-26 ENCOUNTER — Encounter (HOSPITAL_COMMUNITY): Payer: Self-pay | Admitting: Internal Medicine

## 2020-07-26 ENCOUNTER — Observation Stay (HOSPITAL_COMMUNITY): Payer: Medicare Other | Admitting: Anesthesiology

## 2020-07-26 DIAGNOSIS — G894 Chronic pain syndrome: Secondary | ICD-10-CM | POA: Diagnosis not present

## 2020-07-26 DIAGNOSIS — K3184 Gastroparesis: Secondary | ICD-10-CM | POA: Diagnosis present

## 2020-07-26 DIAGNOSIS — E785 Hyperlipidemia, unspecified: Secondary | ICD-10-CM | POA: Diagnosis present

## 2020-07-26 DIAGNOSIS — K319 Disease of stomach and duodenum, unspecified: Secondary | ICD-10-CM | POA: Diagnosis not present

## 2020-07-26 DIAGNOSIS — R1013 Epigastric pain: Secondary | ICD-10-CM | POA: Diagnosis not present

## 2020-07-26 DIAGNOSIS — R1310 Dysphagia, unspecified: Secondary | ICD-10-CM | POA: Diagnosis not present

## 2020-07-26 DIAGNOSIS — E1143 Type 2 diabetes mellitus with diabetic autonomic (poly)neuropathy: Secondary | ICD-10-CM | POA: Diagnosis present

## 2020-07-26 DIAGNOSIS — D631 Anemia in chronic kidney disease: Secondary | ICD-10-CM | POA: Diagnosis present

## 2020-07-26 DIAGNOSIS — K2970 Gastritis, unspecified, without bleeding: Secondary | ICD-10-CM | POA: Diagnosis not present

## 2020-07-26 DIAGNOSIS — Z20822 Contact with and (suspected) exposure to covid-19: Secondary | ICD-10-CM | POA: Diagnosis present

## 2020-07-26 DIAGNOSIS — E11319 Type 2 diabetes mellitus with unspecified diabetic retinopathy without macular edema: Secondary | ICD-10-CM | POA: Diagnosis not present

## 2020-07-26 DIAGNOSIS — K760 Fatty (change of) liver, not elsewhere classified: Secondary | ICD-10-CM | POA: Diagnosis not present

## 2020-07-26 DIAGNOSIS — R634 Abnormal weight loss: Secondary | ICD-10-CM | POA: Diagnosis not present

## 2020-07-26 DIAGNOSIS — R109 Unspecified abdominal pain: Secondary | ICD-10-CM | POA: Diagnosis not present

## 2020-07-26 DIAGNOSIS — K3189 Other diseases of stomach and duodenum: Secondary | ICD-10-CM | POA: Diagnosis not present

## 2020-07-26 DIAGNOSIS — E1136 Type 2 diabetes mellitus with diabetic cataract: Secondary | ICD-10-CM | POA: Diagnosis present

## 2020-07-26 DIAGNOSIS — F329 Major depressive disorder, single episode, unspecified: Secondary | ICD-10-CM | POA: Diagnosis present

## 2020-07-26 DIAGNOSIS — F1120 Opioid dependence, uncomplicated: Secondary | ICD-10-CM | POA: Diagnosis not present

## 2020-07-26 DIAGNOSIS — E1122 Type 2 diabetes mellitus with diabetic chronic kidney disease: Secondary | ICD-10-CM | POA: Diagnosis present

## 2020-07-26 DIAGNOSIS — R0781 Pleurodynia: Secondary | ICD-10-CM | POA: Diagnosis present

## 2020-07-26 DIAGNOSIS — R1084 Generalized abdominal pain: Secondary | ICD-10-CM | POA: Diagnosis not present

## 2020-07-26 DIAGNOSIS — M5136 Other intervertebral disc degeneration, lumbar region: Secondary | ICD-10-CM | POA: Diagnosis present

## 2020-07-26 DIAGNOSIS — I129 Hypertensive chronic kidney disease with stage 1 through stage 4 chronic kidney disease, or unspecified chronic kidney disease: Secondary | ICD-10-CM | POA: Diagnosis present

## 2020-07-26 DIAGNOSIS — K259 Gastric ulcer, unspecified as acute or chronic, without hemorrhage or perforation: Secondary | ICD-10-CM | POA: Diagnosis not present

## 2020-07-26 DIAGNOSIS — M797 Fibromyalgia: Secondary | ICD-10-CM | POA: Diagnosis present

## 2020-07-26 DIAGNOSIS — F419 Anxiety disorder, unspecified: Secondary | ICD-10-CM | POA: Diagnosis present

## 2020-07-26 DIAGNOSIS — S83282A Other tear of lateral meniscus, current injury, left knee, initial encounter: Secondary | ICD-10-CM | POA: Diagnosis not present

## 2020-07-26 DIAGNOSIS — R627 Adult failure to thrive: Secondary | ICD-10-CM | POA: Diagnosis not present

## 2020-07-26 DIAGNOSIS — E7800 Pure hypercholesterolemia, unspecified: Secondary | ICD-10-CM | POA: Diagnosis not present

## 2020-07-26 DIAGNOSIS — G4733 Obstructive sleep apnea (adult) (pediatric): Secondary | ICD-10-CM | POA: Diagnosis not present

## 2020-07-26 DIAGNOSIS — M1611 Unilateral primary osteoarthritis, right hip: Secondary | ICD-10-CM | POA: Diagnosis present

## 2020-07-26 DIAGNOSIS — K219 Gastro-esophageal reflux disease without esophagitis: Secondary | ICD-10-CM | POA: Diagnosis present

## 2020-07-26 DIAGNOSIS — M069 Rheumatoid arthritis, unspecified: Secondary | ICD-10-CM | POA: Diagnosis present

## 2020-07-26 DIAGNOSIS — M1712 Unilateral primary osteoarthritis, left knee: Secondary | ICD-10-CM | POA: Diagnosis not present

## 2020-07-26 DIAGNOSIS — E1142 Type 2 diabetes mellitus with diabetic polyneuropathy: Secondary | ICD-10-CM | POA: Diagnosis present

## 2020-07-26 DIAGNOSIS — E559 Vitamin D deficiency, unspecified: Secondary | ICD-10-CM | POA: Diagnosis not present

## 2020-07-26 HISTORY — PX: BIOPSY: SHX5522

## 2020-07-26 HISTORY — PX: ESOPHAGOGASTRODUODENOSCOPY (EGD) WITH PROPOFOL: SHX5813

## 2020-07-26 LAB — BASIC METABOLIC PANEL
Anion gap: 13 (ref 5–15)
BUN: 17 mg/dL (ref 8–23)
CO2: 26 mmol/L (ref 22–32)
Calcium: 8.9 mg/dL (ref 8.9–10.3)
Chloride: 101 mmol/L (ref 98–111)
Creatinine, Ser: 1.34 mg/dL — ABNORMAL HIGH (ref 0.44–1.00)
GFR calc Af Amer: 47 mL/min — ABNORMAL LOW (ref >60)
GFR calc non Af Amer: 41 mL/min — ABNORMAL LOW (ref >60)
Glucose, Bld: 115 mg/dL — ABNORMAL HIGH (ref 70–99)
Potassium: 3.4 mmol/L — ABNORMAL LOW (ref 3.5–5.1)
Sodium: 140 mmol/L (ref 135–145)

## 2020-07-26 LAB — COMPREHENSIVE METABOLIC PANEL
ALT: 13 U/L (ref 0–44)
AST: 21 U/L (ref 15–41)
Albumin: 3.6 g/dL (ref 3.5–5.0)
Alkaline Phosphatase: 80 U/L (ref 38–126)
Anion gap: 16 — ABNORMAL HIGH (ref 5–15)
BUN: 18 mg/dL (ref 8–23)
CO2: 25 mmol/L (ref 22–32)
Calcium: 9.1 mg/dL (ref 8.9–10.3)
Chloride: 98 mmol/L (ref 98–111)
Creatinine, Ser: 1.46 mg/dL — ABNORMAL HIGH (ref 0.44–1.00)
GFR calc Af Amer: 42 mL/min — ABNORMAL LOW (ref >60)
GFR calc non Af Amer: 37 mL/min — ABNORMAL LOW (ref >60)
Glucose, Bld: 120 mg/dL — ABNORMAL HIGH (ref 70–99)
Potassium: 2.9 mmol/L — ABNORMAL LOW (ref 3.5–5.1)
Sodium: 139 mmol/L (ref 135–145)
Total Bilirubin: 0.9 mg/dL (ref 0.3–1.2)
Total Protein: 6.7 g/dL (ref 6.5–8.1)

## 2020-07-26 LAB — MAGNESIUM: Magnesium: 1.5 mg/dL — ABNORMAL LOW (ref 1.7–2.4)

## 2020-07-26 LAB — CBC
HCT: 35.2 % — ABNORMAL LOW (ref 36.0–46.0)
Hemoglobin: 11.7 g/dL — ABNORMAL LOW (ref 12.0–15.0)
MCH: 31.9 pg (ref 26.0–34.0)
MCHC: 33.2 g/dL (ref 30.0–36.0)
MCV: 95.9 fL (ref 80.0–100.0)
Platelets: 203 10*3/uL (ref 150–400)
RBC: 3.67 MIL/uL — ABNORMAL LOW (ref 3.87–5.11)
RDW: 13.7 % (ref 11.5–15.5)
WBC: 4.5 10*3/uL (ref 4.0–10.5)
nRBC: 0 % (ref 0.0–0.2)

## 2020-07-26 LAB — GLUCOSE, CAPILLARY
Glucose-Capillary: 123 mg/dL — ABNORMAL HIGH (ref 70–99)
Glucose-Capillary: 107 mg/dL — ABNORMAL HIGH (ref 70–99)
Glucose-Capillary: 122 mg/dL — ABNORMAL HIGH (ref 70–99)
Glucose-Capillary: 123 mg/dL — ABNORMAL HIGH (ref 70–99)

## 2020-07-26 LAB — HIV ANTIBODY (ROUTINE TESTING W REFLEX): HIV Screen 4th Generation wRfx: NONREACTIVE

## 2020-07-26 SURGERY — ESOPHAGOGASTRODUODENOSCOPY (EGD) WITH PROPOFOL
Anesthesia: Monitor Anesthesia Care

## 2020-07-26 MED ORDER — MORPHINE SULFATE (PF) 2 MG/ML IV SOLN
1.0000 mg | INTRAVENOUS | Status: DC | PRN
  Administered 2020-07-26: 2 mg via INTRAVENOUS
  Filled 2020-07-26: qty 1

## 2020-07-26 MED ORDER — POTASSIUM CHLORIDE CRYS ER 20 MEQ PO TBCR: 40.0000 meq | EXTENDED_RELEASE_TABLET | Freq: Two times a day (BID) | ORAL | Status: DC

## 2020-07-26 MED ORDER — PROPOFOL 10 MG/ML IV BOLUS
INTRAVENOUS | Status: DC | PRN
  Administered 2020-07-26: 40 mg via INTRAVENOUS

## 2020-07-26 MED ORDER — PROPOFOL 500 MG/50ML IV EMUL
INTRAVENOUS | Status: AC
  Filled 2020-07-26: qty 50

## 2020-07-26 MED ORDER — TRAZODONE HCL 100 MG PO TABS
100.0000 mg | ORAL_TABLET | Freq: Every day | ORAL | Status: DC
  Administered 2020-07-26 – 2020-07-28 (×3): 100 mg via ORAL
  Filled 2020-07-26 (×3): qty 1

## 2020-07-26 MED ORDER — SIMETHICONE 80 MG PO CHEW
80.0000 mg | CHEWABLE_TABLET | Freq: Four times a day (QID) | ORAL | Status: DC | PRN
  Administered 2020-07-26: 80 mg via ORAL
  Filled 2020-07-26: qty 1

## 2020-07-26 MED ORDER — LIDOCAINE HCL (CARDIAC) PF 100 MG/5ML IV SOSY
PREFILLED_SYRINGE | INTRAVENOUS | Status: DC | PRN
  Administered 2020-07-26: 100 mg via INTRAVENOUS

## 2020-07-26 MED ORDER — PROPOFOL 500 MG/50ML IV EMUL
INTRAVENOUS | Status: DC | PRN
  Administered 2020-07-26: 140 ug/kg/min via INTRAVENOUS

## 2020-07-26 MED ORDER — LACTATED RINGERS IV SOLN: INTRAVENOUS | Status: DC

## 2020-07-26 MED ORDER — FENTANYL CITRATE (PF) 100 MCG/2ML IJ SOLN
25.0000 ug | INTRAMUSCULAR | Status: DC | PRN
  Administered 2020-07-26: 25 ug via INTRAVENOUS
  Filled 2020-07-26 (×2): qty 2

## 2020-07-26 MED ORDER — MAGNESIUM SULFATE 4 GM/100ML IV SOLN
4.0000 g | Freq: Once | INTRAVENOUS | Status: AC
  Administered 2020-07-26: 4 g via INTRAVENOUS
  Filled 2020-07-26: qty 100

## 2020-07-26 MED ORDER — PROPOFOL 1000 MG/100ML IV EMUL
INTRAVENOUS | Status: AC
  Filled 2020-07-26: qty 100

## 2020-07-26 MED ORDER — POTASSIUM CHLORIDE 10 MEQ/100ML IV SOLN
10.0000 meq | INTRAVENOUS | Status: AC
  Administered 2020-07-26 (×2): 10 meq via INTRAVENOUS
  Filled 2020-07-26 (×2): qty 100

## 2020-07-26 SURGICAL SUPPLY — 15 items

## 2020-07-26 NOTE — Plan of Care (Signed)
  Problem: Education: Goal: Knowledge of General Education information will improve Description: Including pain rating scale, medication(s)/side effects and non-pharmacologic comfort measures Outcome: Progressing   Problem: Pain Managment: Goal: General experience of comfort will improve Outcome: Progressing   

## 2020-07-26 NOTE — Anesthesia Postprocedure Evaluation (Signed)
Anesthesia Post Note  Patient: Angel French  Procedure(s) Performed: ESOPHAGOGASTRODUODENOSCOPY (EGD) WITH PROPOFOL (N/A ) BIOPSY     Patient location during evaluation: PACU Anesthesia Type: MAC Level of consciousness: sedated Pain management: pain level controlled Vital Signs Assessment: post-procedure vital signs reviewed and stable Respiratory status: spontaneous breathing Cardiovascular status: stable Postop Assessment: no apparent nausea or vomiting Anesthetic complications: no   No complications documented.  Last Vitals:  Vitals:   07/26/20 1309 07/26/20 1358  BP: (!) 170/75 (!) 141/90  Pulse: 93 93  Resp: 15 12  Temp:  36.9 C  SpO2: 100% 100%    Last Pain:  Vitals:   07/26/20 1358  TempSrc: Oral  PainSc: 5                  John F Illene Sweeting Jr

## 2020-07-26 NOTE — Progress Notes (Signed)
PROGRESS NOTE    Angel French  CVE:[id number] DOB: 05-Sep-1951 DOA: 07/25/2020 PCP: Wardell Honour, MD    Chief Complaint  Patient presents with  . Abdominal Pain    Brief Narrative:  68 year old lady prior history of rheumatoid arthritis, stage IIIa CKD, chronic pain syndrome, diabetes mellitus, GERD, hyperlipidemia, peripheral neuropathy, history of PUD, depression presents with loss of appetite, dysphagia, unintentional weight loss and abdominal pain.    Assessment & Plan:   Active Problems:   Abdominal pain   Dysphagia, anorexia, unintentional weight loss, abdominal pain Unclear etiology so far. CT of the abdomen and pelvis without contrast showed Cystic area in the LEFT adnexa not significantly changed from previous imaging. Further evaluation by gastroenterology.  EGD done today showed mild patchy inflammation in the stomach. Continue with PPI.    Essential hypertension Blood pressure parameters are well controlled.   Diabetes mellitus Continue with sliding scale insulin.   Depression Continue with Cymbalta and Lexapro.   Mild anemia of chronic disease Hemoglobin stable around 11.    Stage IIIa CKD Creatinine appears to be at baseline.    Hypokalemia and hypomagnesemia Replaced   DVT prophylaxis: (Heparin ) code Status: (Full code.  Family Communication:  None at bedside.  Disposition:   Status is: Observation  The patient will require care spanning > 2 midnights and should be moved to inpatient because: Ongoing diagnostic testing needed not appropriate for outpatient work up  Dispo: The patient is from: Home              Anticipated d/c is to: Home              Anticipated d/c date is: 1 day              Patient currently is not  medically stable to d/c.       Consultants:   GI Dr Paulita Fujita.    Procedures: EGD gastritis   Esophagogram No gross esophageal abnormality identified on this limited single contrast barium esophagram.   Antimicrobials:none    Subjective: Reports abdominal pain.   Objective: Vitals:   07/26/20 1309 07/26/20 1358 07/26/20 1400 07/26/20 1413  BP: (!) 170/75 (!) 141/90 (!) 161/85 (!) 158/78  Pulse: 93 93 88 89  Resp: 15 12 11  (!) 8  Temp:  98.4 F (36.9 C)    TempSrc:  Oral    SpO2: 100% 100% 98% 98%  Weight:      Height:        Intake/Output Summary (Last 24 hours) at 07/26/2020 1429 Last data filed at 07/26/2020 1359 Gross per 24 hour  Intake 805.64 ml  Output 200 ml  Net 605.64 ml   Filed Weights   07/25/20 1602  Weight: 73.5 kg    Examination:  General exam: in mild distress from abd pain.  Respiratory system: Clear to auscultation. Respiratory effort normal. Cardiovascular system: S1 & S2 heard, RRR. No JVD, murmurs,No pedal edema. Gastrointestinal system: Abdomen is nondistended, soft and nontender.  Normal bowel sounds heard. Central nervous system: Alert and oriented. No focal neurological deficits. Extremities: Symmetric 5 x 5 power. Skin: No rashes, lesions or ulcers Psychiatry:  Anxious     Data Reviewed: I have personally reviewed following labs and imaging studies  CBC: Recent Labs  Lab 07/25/20 0832 07/26/20 0355  WBC 3.6* 4.5  NEUTROABS 2.0  --   HGB 11.2* 11.7*  HCT 32.2* 35.2*  MCV 92.8 95.9  PLT 198 203    Basic  Metabolic Panel: Recent Labs  Lab 07/25/20 0832 07/26/20 0355 07/26/20 1032  NA 141 139 140  K 3.4* 2.9* 3.4*  CL 101 98 101  CO2 25 25 26   GLUCOSE 129* 120* 115*  BUN 24* 18 17  CREATININE 1.49* 1.46* 1.34*  CALCIUM 8.6* 9.1 8.9  MG  --   --  1.5*    GFR: Estimated Creatinine Clearance: 37.7 mL/min (A) (by C-G formula based on SCr of 1.34 mg/dL (H)).  Liver Function Tests: Recent Labs  Lab  07/25/20 0832 07/26/20 0355  AST 23 21  ALT 10 13  ALKPHOS 77 80  BILITOT 1.4* 0.9  PROT 6.2* 6.7  ALBUMIN 3.4* 3.6    CBG: Recent Labs  Lab 07/25/20 0846 07/25/20 1701 07/25/20 2101 07/26/20 0810 07/26/20 1143  GLUCAP 157* 104* 91 123* 107*     Recent Results (from the past 240 hour(s))  Respiratory Panel by RT PCR (Flu A&B, Covid) - Nasopharyngeal Swab     Status: None   Collection Time: 07/25/20  1:40 PM   Specimen: Nasopharyngeal Swab  Result Value Ref Range Status   SARS Coronavirus 2 by RT PCR NEGATIVE NEGATIVE Final    Comment: (NOTE) SARS-CoV-2 target nucleic acids are NOT DETECTED.  The SARS-CoV-2 RNA is generally detectable in upper respiratoy specimens during the acute phase of infection. The lowest concentration of SARS-CoV-2 viral copies this assay can detect is 131 copies/mL. A negative result does not preclude SARS-Cov-2 infection and should not be used as the sole basis for treatment or other patient management decisions. A negative result may occur with  improper specimen collection/handling, submission of specimen other than nasopharyngeal swab, presence of viral mutation(s) within the areas targeted by this assay, and inadequate number of viral copies (<131 copies/mL). A negative result must be combined with clinical observations, patient history, and epidemiological information. The expected result is Negative.  Fact Sheet for Patients:  PinkCheek.be  Fact Sheet for Healthcare Providers:  GravelBags.it  This test is no t yet approved or cleared by the [place] FDA and  has been authorized for detection and/or diagnosis of SARS-CoV-2 by FDA under an Emergency Use Authorization (EUA). This EUA will remain  in effect (meaning this test can be used) for the duration of the COVID-19 declaration under Section 564(b)(1) of the Act, 21 U.S.C. section 360bbb-3(b)(1), unless the  authorization is terminated or revoked sooner.     Influenza A by PCR NEGATIVE NEGATIVE Final   Influenza B by PCR NEGATIVE NEGATIVE Final    Comment: (NOTE) The Xpert Xpress SARS-CoV-2/FLU/RSV assay is intended as an aid in  the diagnosis of influenza from Nasopharyngeal swab specimens and  should not be used as a sole basis for treatment. Nasal washings and  aspirates are unacceptable for Xpert Xpress SARS-CoV-2/FLU/RSV  testing.  Fact Sheet for Patients: PinkCheek.be  Fact Sheet for Healthcare Providers: GravelBags.it  This test is not yet approved or cleared by the [place] FDA and  has been authorized for detection and/or diagnosis of SARS-CoV-2 by  FDA under an Emergency Use Authorization (EUA). This EUA will remain  in effect (meaning this test can be used) for the duration of the  Covid-19 declaration under Section 564(b)(1) of the Act, 21  U.S.C. section 360bbb-3(b)(1), unless the authorization is  terminated or revoked. Performed at [facility], [place] [address], [place], [place] [address]          Radiology Studies: CT Abdomen Pelvis Wo Contrast  Result  Date: 07/25/2020 CLINICAL DATA:  Acute abdominal pain, nonlocalized EXAM: CT ABDOMEN AND PELVIS WITHOUT CONTRAST TECHNIQUE: Multidetector CT imaging of the abdomen and pelvis was performed following the standard protocol without IV contrast. COMPARISON:  02/21/2020 FINDINGS: Lower chest: Incidental imaging of the lung bases without consolidation or pleural effusion. Hepatobiliary: No focal, suspicious hepatic abnormality. Post cholecystectomy. No biliary duct dilation. Pancreas: Pancreas without inflammation or contour abnormality. Spleen: Spleen normal in size and contour. Adrenals/Urinary Tract: Adrenal glands are normal. Kidneys without hydronephrosis. Mild cortical scarring. No nephrolithiasis. Stomach/Bowel: Under distension of the  colon in the ascending through the transverse colon limiting assessment. Appendix is normal. Stomach under distended limiting assessment. Small bowel normal in course and caliber without signs of adjacent inflammation. Vascular/Lymphatic: Atherosclerotic calcification of the abdominal aorta. There is no gastrohepatic or hepatoduodenal ligament lymphadenopathy. No retroperitoneal or mesenteric lymphadenopathy. No pelvic sidewall lymphadenopathy. Reproductive: Post hysterectomy. Cystic area in the LEFT adnexa. Gas in the vagina. LEFT adnexal cystic area favored to be related to the ovary based on relationship of vessels measuring 3.3 x 2.1 cm, slightly diminished in size compared to the prior exam. Other: No ascites.  No free air. Musculoskeletal: No acute musculoskeletal process or destructive bone finding. Spinal degenerative changes similar to the prior study. IMPRESSION: 1. Cystic area in the LEFT adnexa not significantly changed from previous imaging. Based on previous ultrasound of the pelvis and current ACR recommendations would suggest nonemergent ultrasound follow-up for further evaluation. 2. Small amount of gas adjacent the vaginal apex without significant surrounding stranding or change since the prior study may represent redundant vaginal cuff. Correlate with any signs of vaginal drainage or pelvic pain. 3. Under distension of the colon, question of mild hypervascularity could be seen in the setting of mild colitis. Correlate with symptoms and history. 4. Post cholecystectomy with normal appendix. Electronically Signed   By: Zetta Bills M.D.   On: 07/25/2020 10:25   DG Chest Port 1 View  Result Date: 07/25/2020 CLINICAL DATA:  Epigastric pain EXAM: PORTABLE CHEST 1 VIEW COMPARISON:  06/23/2020 FINDINGS: The heart size and mediastinal contours are within normal limits. No focal airspace consolidation, pleural effusion, or pneumothorax. The visualized skeletal structures are unremarkable. IMPRESSION:  No active disease. Electronically Signed   By: Davina Poke D.O.   On: 07/25/2020 09:35   DG ESOPHAGUS W SINGLE CM (SOL OR THIN BA)  Result Date: 07/25/2020 CLINICAL DATA:  Dysphagia.  Nausea after eating/drinking. EXAM: ESOPHOGRAM/BARIUM SWALLOW TECHNIQUE: Single contrast examination was performed using  thin barium. FLUOROSCOPY TIME:  Fluoroscopy Time:  2 minutes and 36 seconds. Radiation Exposure Index (if provided by the fluoroscopic device): 86 mGy Number of Acquired Spot Images: COMPARISON:  None. FINDINGS: Limited study due to patient immobility. Patient was placed on the fluoro table which was moved to a 45-60 degree head up position with assessment performed with the patient in both oblique positions. Patient was given sips of thin barium by mouth and monitored fluoroscopically while swallowing. She did have some discomfort on the initial swallowing phase with barium, but not so much with water. No evidence for gross esophageal mass lesion, diverticulum, or gross mucosal ulceration. No stricture evident. Patient was given a 13 mm barium tablet. This passed into the stomach after repeated swallows of water. IMPRESSION: No gross esophageal abnormality identified on this limited single contrast barium esophagram. Electronically Signed   By: Misty Stanley M.D.   On: 07/25/2020 13:20        Scheduled Meds: . [facility]  Hold] DULoxetine  30 mg Oral Daily  . [MAR Hold] escitalopram  10 mg Oral Daily  . [MAR Hold] heparin  5,000 Units Subcutaneous Q8H  . [MAR Hold] insulin aspart  0-6 Units Subcutaneous TID WC  . [MAR Hold] leflunomide  20 mg Oral Daily  . [MAR Hold] oxybutynin  15 mg Oral QHS  . [MAR Hold] pantoprazole (PROTONIX) IV  40 mg Intravenous Q24H  . [MAR Hold] rosuvastatin  10 mg Oral Daily   Continuous Infusions: . lactated ringers Stopped (07/26/20 1407)  . magnesium sulfate bolus IVPB       LOS: 0 days       Hosie Poisson, MD [facility]   To contact the  attending provider between 7A-7P or the covering provider during after hours 7P-7A, please log into the web site [email or web address] and access using universal [place] password for that web site. If you do not have the password, please call the hospital operator.  07/26/2020, 2:29 PM

## 2020-07-26 NOTE — Anesthesia Preprocedure Evaluation (Addendum)
Anesthesia Evaluation  Patient identified by MRN, date of birth, ID band Patient awake    Reviewed: Allergy & Precautions, NPO status , Patient's Chart, lab work & pertinent test results  Airway Mallampati: I  TM Distance: >3 FB Neck ROM: Full    Dental no notable dental hx.    Pulmonary former smoker,    Pulmonary exam normal        Cardiovascular hypertension, Normal cardiovascular exam     Neuro/Psych PSYCHIATRIC DISORDERS Anxiety Depression    GI/Hepatic GERD  Medicated,  Endo/Other  diabetes, Type 2  Renal/GU      Musculoskeletal   Abdominal Normal abdominal exam  (+)   Peds  Hematology  (+) Blood dyscrasia, anemia ,   Anesthesia Other Findings   - Left ventricle: The cavity size was normal. Wall thickness was  normal. Systolic function was normal. The estimated ejection  fraction was in the range of 60% to 65%. Wall motion was normal;  there were no regional wall motion abnormalities. Doppler  parameters are consistent with abnormal left ventricular  relaxation (grade 1 diastolic dysfunction).  - Aortic valve: There was no stenosis.  - Mitral valve: Mildly calcified annulus. There was no significant  regurgitation.  - Right ventricle: The cavity size was normal. Systolic function  was normal.  - Pulmonary arteries: No complete TR doppler jet so unable to  estimate PA systolic pressure.  - Inferior vena cava: The vessel was normal in size. The  respirophasic diameter changes were in the normal range (>= 50%),  consistent with normal central venous pressure.   Impressions:   - Normal LV size with EF 60-65%. Normal RV size and systolic  function. No significant valvular abnormalities.   -------------------------------------------------------------------  Study data: No prior study was available for comparison. Study  status: Routine. Procedure: The patient reported no pain  pre or  post test. Transthoracic echocardiography for left ventricular  function evaluation. Image quality was adequate. Study completion:  There were no complications.     Transthoracic  echocardiography. M-mode, complete 2D, spectral Doppler, and color  Doppler. Birthdate: Patient birthdate: [birth year]. Age: Patient  is 69 yr old. Sex: Gender: female.  BMI: 40.5 kg/m^2. Blood  pressure:   142/72 Patient status: Inpatient. Study date:  Study date: 02/13/2018. Study time: 03:15 PM. Location: [facility]   -------------------------------------------------------------------   -------------------------------------------------------------------  Left ventricle: The cavity size was normal. Wall thickness was  normal. Systolic function was normal. The estimated ejection  fraction was in the range of 60% to 65%. Wall motion was normal;  there were no regional wall motion abnormalities. Doppler  parameters are consistent with abnormal left ventricular relaxation  (grade 1 diastolic dysfunction).   -------------------------------------------------------------------  Aortic valve:  Trileaflet; mildly calcified leaflets. Doppler:  There was no stenosis.  There was no regurgitation.   -------------------------------------------------------------------  Aorta: Aortic root: The aortic root was normal in size.  Ascending aorta: The ascending aorta was normal in size.   -------------------------------------------------------------------  Mitral valve:  Mildly calcified annulus. Doppler:  There was no  evidence for stenosis.  There was no significant regurgitation.  Peak gradient (D): 4 mm Hg.   -------------------------------------------------------------------  Left atrium: The atrium was normal in size.   -------------------------------------------------------------------  Right ventricle: The cavity size was normal. Systolic function was  normal.    -------------------------------------------------------------------  Pulmonic valve:  Structurally normal valve.  Cusp separation was  normal. Doppler: Transvalvular velocity was within the normal  range. There was no regurgitation.   -------------------------------------------------------------------  Tricuspid valve:  Doppler: There was no significant  regurgitation.   -------------------------------------------------------------------  Pulmonary artery:  No complete TR doppler jet so unable to  estimate PA systolic pressure.   -------------------------------------------------------------------  Right atrium: The atrium was normal in size.   -------------------------------------------------------------------  Pericardium: There was no pericardial effusion.   -------------------------------------------------------------------  Systemic veins:  Inferior vena cava: The vessel was normal in size. The  respirophasic diameter changes were in the normal range (>= 50%),  consistent with normal central venous pressure.   -------------------------------------------------------------------  Measurements   Left ventricle             Value    Reference  LV ID, ED, PLAX chordal    (L)   39.6 mm   43 - 52  LV ID, ES, PLAX chordal        26.2 mm   23 - 38  LV fx shortening, PLAX chordal     34  %   >=29  LV PW thickness, ED          11.5 mm   ---------  IVS/LV PW ratio, ED          0.97     <=1.3  Stroke volume, 2D           69  ml   ---------  Stroke volume/bsa, 2D         32  ml/m^2 ---------  LV e&', lateral             5.46 cm/s  ---------  LV E/e&', lateral            17.64    ---------  LV e&', medial             6.92 cm/s  ---------  LV E/e&', medial            13.92    ---------  LV e&', average              6.19 cm/s  ---------  LV E/e&', average            15.56    ---------    Ventricular septum           Value    Reference  IVS thickness, ED           11.2 mm   ---------    LVOT                  Value    Reference  LVOT ID, S               20  mm   ---------  LVOT area               3.14 cm^2  ---------  LVOT ID                20  mm   ---------  LVOT peak velocity, S         99.3 cm/s  ---------  LVOT mean velocity, S         69.2 cm/s  ---------  LVOT VTI, S              22.1 cm   ---------  LVOT peak gradient, S         4   mm Hg ---------  Stroke volume (SV), LVOT DP      69.4 ml   ---------  Stroke index (SV/bsa), LVOT DP     32.1  ml/m^2 ---------    Aorta                 Value    Reference  Aortic root ID, ED           32  mm   ---------  Ascending aorta ID, A-P, S       27  mm   ---------    Left atrium              Value    Reference  LA ID, A-P, ES             36  mm   ---------  LA ID/bsa, A-P             1.67 cm/m^2 <=2.2  LA volume, S              60  ml   ---------  LA volume/bsa, S            27.8 ml/m^2 ---------  LA volume, ES, 1-p A4C         60  ml   ---------  LA volume/bsa, ES, 1-p A4C       27.8 ml/m^2 ---------  LA volume, ES, 1-p A2C         60  ml   ---------  LA volume/bsa, ES, 1-p A2C       27.8 ml/m^2 ---------    Mitral valve              Value    Reference  Mitral E-wave peak velocity      96.3 cm/s  ---------  Mitral A-wave peak velocity      116  cm/s  ---------  Mitral deceleration time    (H)   331  ms   150 - 230   Mitral peak gradient, D        4   mm Hg ---------  Mitral E/A ratio, peak         0.8     ---------    Systemic veins             Value    Reference  Estimated CVP             3   mm Hg ---------    Right ventricle            Value    Reference  RV s&', lateral, S           13.4 cm/s  ---------   Legend:  (L) and (H) mark values outside specified reference range.   -------------------------------------------------------------------  Prepared and Electronically Authenticated by   Loralie Champagne, M.D.  2019-04-15T21:32:44 Imaging Info  ECHOCARDIOGRAM COMPLETE (Order [id number]) on 02/13/18 Study History  ECHOCARDIOGRAM COMPLETE (Order #[id number]) on 02/13/18 [employer] Images  Show images for ECHOCARDIOGRAM COMPLETE Images on [employer]  Show images for Waller, Angel French "Angel French" Performing Technologist/Nurse  Performing Technologist/Nurse: Elyse Jarvis Reason for Exam Priority: Routine Dx: Right facial numbness (R20.0 (ICD-10-CM)); TIA (transient ischemic attack) (G45.9 (ICD-10-CM)) Surgical History  Surgical History   Procedure Laterality Date Comment Source CARDIAC CATHETERIZATION  11/02/2007 normal coronary arteries.   Other Surgical History   Procedure Laterality Date Comment Source 2 SPINAL INJECTIONS      ABDOMINAL HYSTERECTOMY  11/02/1979 DUB; cervical dysplasia; ovaries intact.  ABDOMINAL SURGERY   staph abcess   [facility] Admission   age 51; three months in [place].  BREAST BIOPSY  CARPAL TUNNEL RELEASE   Bilateral.  CATARACT EXTRACTION, BILATERAL     CHOLECYSTECTOMY     ESOPHAGEAL MANOMETRY N/A 09/14/2017 Procedure: ESOPHAGEAL MANOMETRY (EM); Surgeon: Ronnette Juniper, MD; Location: [facility]; Service: Gastroenterology; Laterality: N/A;  EYE SURGERY   Cataracts B. Laser surgery x 7 for Diabetic  Retinopathy  TONSILLECTOMY      Implants   No active implants to display in this view. Encounter-Level Documents - 02/13/2018:  Electronic signature on 02/13/2018 2:51 PM: CSN: [id number] - E-signed Electronic signature on 02/13/2018 2:51 PM - Quintella Baton Documents:  There are no order-level documents. Resulted by:  Signed Date/Time  Phone Pager Larey Dresser 02/13/2018 9:32 PM [phone]  External Result Report  External Result Report ECHOCARDIOGRAM COMPLETE: Patient Communication Append Comments Seen  Echocardiogram of heart is overall normal. Written by Wardell Honour, MD on 02/15/2018 12:43 AM EDT   Reproductive/Obstetrics                            Anesthesia Physical Anesthesia Plan  ASA: II  Anesthesia Plan: MAC   Post-op Pain Management:    Induction:   PONV Risk Score and Plan: 2 and Treatment may vary due to age or medical condition  Airway Management Planned: Mask and Natural Airway  Additional Equipment: None  Intra-op Plan:   Post-operative Plan:   Informed Consent: I have reviewed the patients History and Physical, chart, labs and discussed the procedure including the risks, benefits and alternatives for the proposed anesthesia with the patient or authorized representative who has indicated his/her understanding and acceptance.       Plan Discussed with: CRNA  Anesthesia Plan Comments:        Anesthesia Quick Evaluation

## 2020-07-26 NOTE — Transfer of Care (Signed)
Immediate Anesthesia Transfer of Care Note  Patient: Angel French  Procedure(s) Performed: ESOPHAGOGASTRODUODENOSCOPY (EGD) WITH PROPOFOL (N/A ) BIOPSY  Patient Location: PACU  Anesthesia Type:MAC  Level of Consciousness: awake, alert , oriented and patient cooperative  Airway & Oxygen Therapy: Patient Spontanous Breathing and Patient connected to face mask oxygen  Post-op Assessment: Report given to RN and Post -op Vital signs reviewed and stable  Post vital signs: Reviewed and stable  Last Vitals:  Vitals Value Taken Time  BP    Temp    Pulse    Resp    SpO2      Last Pain:  Vitals:   07/26/20 1309  TempSrc:   PainSc: 8       Patients Stated Pain Goal: 2 (71/21/97 5883)  Complications: No complications documented.

## 2020-07-26 NOTE — Plan of Care (Signed)
  Problem: Education: Goal: Knowledge of General Education information will improve Description: Including pain rating scale, medication(s)/side effects and non-pharmacologic comfort measures Outcome: Progressing   Problem: Coping: Goal: Level of anxiety will decrease Outcome: Progressing   

## 2020-07-26 NOTE — Interval H&P Note (Signed)
History and Physical Interval Note:  07/26/2020 1:21 PM  Angel French  has presented today for surgery, with the diagnosis of Epigastric pain, anorexia, weight loss.  The various methods of treatment have been discussed with the patient and family. After consideration of risks, benefits and other options for treatment, the patient has consented to  Procedure(s): ESOPHAGOGASTRODUODENOSCOPY (EGD) WITH PROPOFOL (N/A) as a surgical intervention.  The patient's history has been reviewed, patient examined, no change in status, stable for surgery.  I have reviewed the patient's chart and labs.  Questions were answered to the patient's satisfaction.     [name]

## 2020-07-26 NOTE — Op Note (Signed)
[facility] Patient Name: Angel French Procedure Date: 07/26/2020 MRN: [id number] Attending MD: Arta Silence , MD Date of Birth: [birth year] CSN: [id number] Age: 69 Admit Type: Inpatient Procedure:                Upper GI endoscopy Indications:              Epigastric abdominal pain, Failure to thrive,                            Weight loss Providers:                Arta Silence, MD, Grace Isaac, RN, Cletis [place],                            Technician, Stephanie [place] CRNA Referring MD:             [facility] Medicines:                Monitored Anesthesia Care Complications:            No immediate complications. Estimated Blood Loss:     Estimated blood loss: none. Procedure:                Pre-Anesthesia Assessment:                           - Prior to the procedure, a History and Physical                            was performed, and patient medications and                            allergies were reviewed. The patient's tolerance of                            previous anesthesia was also reviewed. The risks                            and benefits of the procedure and the sedation                            options and risks were discussed with the patient.                            All questions were answered, and informed consent                            was obtained. Prior Anticoagulants: The patient has                            taken no previous anticoagulant or antiplatelet                            agents. ASA Grade Assessment: II - A patient with  mild systemic disease. After reviewing the risks                            and benefits, the patient was deemed in                            satisfactory condition to undergo the procedure.                           After obtaining informed consent, the endoscope was                            passed under direct vision. Throughout the                             procedure, the patient's blood pressure, pulse, and                            oxygen saturations were monitored continuously. The                            GIF-H190 ([id number]) Olympus gastroscope was                            introduced through the mouth, and advanced to the                            second part of duodenum. The upper GI endoscopy was                            accomplished without difficulty. The patient                            tolerated the procedure well. Scope In: Scope Out: Findings:      The examined esophagus was normal.      Patchy mild inflammation was found in the gastric body and in the       gastric antrum. Biopsies were taken with a cold forceps for histology.      The exam of the stomach was otherwise normal.      The duodenal bulb, first portion of the duodenum and second portion of       the duodenum were normal. Impression:               - Normal esophagus.                           - Gastritis. Biopsied.                           - Normal duodenal bulb, first portion of the                            duodenum and second portion of the duodenum. Moderate Sedation:      Not Applicable - Patient had care per Anesthesia. Recommendation:           -  Return patient to hospital ward for ongoing care.                           - Soft diet today.                           - Continue present medications.                           - Await pathology results.                           [facility] will follow. Procedure Code(s):        --- Professional ---                           [phone], Esophagogastroduodenoscopy, flexible,                            transoral; with biopsy, single or multiple Diagnosis Code(s):        --- Professional ---                           K29.70, Gastritis, unspecified, without bleeding                           R10.13, Epigastric pain                           R62.7, Adult failure to thrive                           R63.4,  Abnormal weight loss CPT copyright 2019 American Medical Association. All rights reserved. The codes documented in this report are preliminary and upon coder review may  be revised to meet current compliance requirements. Arta Silence, MD 07/26/2020 1:59:39 PM This report has been signed electronically. Number of Addenda: 0

## 2020-07-27 DIAGNOSIS — R1084 Generalized abdominal pain: Secondary | ICD-10-CM

## 2020-07-27 LAB — BASIC METABOLIC PANEL
Anion gap: 10 (ref 5–15)
BUN: 15 mg/dL (ref 8–23)
CO2: 27 mmol/L (ref 22–32)
Calcium: 8.9 mg/dL (ref 8.9–10.3)
Chloride: 102 mmol/L (ref 98–111)
Creatinine, Ser: 1.42 mg/dL — ABNORMAL HIGH (ref 0.44–1.00)
GFR calc Af Amer: 44 mL/min — ABNORMAL LOW (ref >60)
GFR calc non Af Amer: 38 mL/min — ABNORMAL LOW (ref >60)
Glucose, Bld: 146 mg/dL — ABNORMAL HIGH (ref 70–99)
Potassium: 3.5 mmol/L (ref 3.5–5.1)
Sodium: 139 mmol/L (ref 135–145)

## 2020-07-27 LAB — CBC
HCT: 32.7 % — ABNORMAL LOW (ref 36.0–46.0)
Hemoglobin: 10.7 g/dL — ABNORMAL LOW (ref 12.0–15.0)
MCH: 31.4 pg (ref 26.0–34.0)
MCHC: 32.7 g/dL (ref 30.0–36.0)
MCV: 95.9 fL (ref 80.0–100.0)
Platelets: 156 10*3/uL (ref 150–400)
RBC: 3.41 MIL/uL — ABNORMAL LOW (ref 3.87–5.11)
RDW: 13.8 % (ref 11.5–15.5)
WBC: 4.3 10*3/uL (ref 4.0–10.5)
nRBC: 0 % (ref 0.0–0.2)

## 2020-07-27 LAB — HEMOGLOBIN A1C
Hgb A1c MFr Bld: 6 % — ABNORMAL HIGH (ref 4.8–5.6)
Mean Plasma Glucose: 125.5 mg/dL

## 2020-07-27 LAB — GLUCOSE, CAPILLARY
Glucose-Capillary: 142 mg/dL — ABNORMAL HIGH (ref 70–99)
Glucose-Capillary: 139 mg/dL — ABNORMAL HIGH (ref 70–99)
Glucose-Capillary: 89 mg/dL (ref 70–99)
Glucose-Capillary: 125 mg/dL — ABNORMAL HIGH (ref 70–99)

## 2020-07-27 LAB — PHOSPHORUS: Phosphorus: 3 mg/dL (ref 2.5–4.6)

## 2020-07-27 LAB — MAGNESIUM: Magnesium: 2 mg/dL (ref 1.7–2.4)

## 2020-07-27 MED ORDER — ENSURE ENLIVE PO LIQD
237.0000 mL | Freq: Three times a day (TID) | ORAL | Status: DC
  Administered 2020-07-27 – 2020-07-28 (×3): 237 mL via ORAL

## 2020-07-27 MED ORDER — METOCLOPRAMIDE HCL 5 MG/ML IJ SOLN
5.0000 mg | Freq: Three times a day (TID) | INTRAMUSCULAR | Status: DC
  Administered 2020-07-27 – 2020-07-29 (×6): 5 mg via INTRAVENOUS
  Filled 2020-07-27 (×6): qty 2

## 2020-07-27 MED ORDER — PANTOPRAZOLE SODIUM 40 MG PO TBEC
40.0000 mg | DELAYED_RELEASE_TABLET | Freq: Every day | ORAL | Status: DC
  Administered 2020-07-27 – 2020-07-28 (×2): 40 mg via ORAL
  Filled 2020-07-27 (×2): qty 1

## 2020-07-27 MED ORDER — PROMETHAZINE HCL 25 MG/ML IJ SOLN
12.5000 mg | Freq: Four times a day (QID) | INTRAMUSCULAR | Status: DC | PRN
  Administered 2020-07-27 (×2): 12.5 mg via INTRAVENOUS
  Filled 2020-07-27 (×2): qty 1

## 2020-07-27 MED ORDER — AMLODIPINE BESYLATE 5 MG PO TABS
5.0000 mg | ORAL_TABLET | Freq: Every day | ORAL | Status: DC
  Administered 2020-07-27 – 2020-07-29 (×3): 5 mg via ORAL
  Filled 2020-07-27 (×3): qty 1

## 2020-07-27 NOTE — Plan of Care (Signed)
  Problem: Education: Goal: Knowledge of General Education information will improve Description: Including pain rating scale, medication(s)/side effects and non-pharmacologic comfort measures Outcome: Progressing   Problem: Clinical Measurements: Goal: Will remain free from infection Outcome: Progressing   Problem: Coping: Goal: Level of anxiety will decrease Outcome: Progressing   

## 2020-07-27 NOTE — Progress Notes (Signed)
PROGRESS NOTE    Angel French  IWL:[id number] DOB: 01-Mar-1951 DOA: 07/25/2020 PCP: Wardell Honour, MD    Chief Complaint  Patient presents with  . Abdominal Pain    Brief Narrative:  69 year old lady prior history of rheumatoid arthritis, stage IIIa CKD, chronic pain syndrome, diabetes mellitus, GERD, hyperlipidemia, peripheral neuropathy, history of PUD, depression presents with loss of appetite, dysphagia, unintentional weight loss and abdominal pain. EDG done on 07/26/20 showed patchy mild inflammation in the stomach.  Pt seen and examined at bedside, reports being nauseated since this am, no vomiting or diarrhea, some abd discomfort.     Assessment & Plan:   Active Problems:   Abdominal pain   Dysphagia   Dysphagia, anorexia, unintentional weight loss, abdominal pain Unclear etiology so far. CT of the abdomen and pelvis without contrast showed Cystic area in the LEFT adnexa not significantly changed from previous imaging. Further evaluation by gastroenterology.  EGD done  showed mild patchy inflammation in the stomach. Continue with PPI. Pt reports persistent nausea today. Started on IV phenergan and IV reglan.     Essential hypertension Sub optimal BP parameters , added norvasc 5 mg daily.  Probably elevated due to nausea.   Diabetes mellitus Continue with sliding scale insulin. CBG (last 3)  Recent Labs    07/26/20 2205 07/27/20 0739 07/27/20 1200  GLUCAP 123* 142* 139*      Depression Continue with Cymbalta and Lexapro.   Mild anemia of chronic disease Hemoglobin stable around 11.    Stage IIIa CKD Creatinine appears to be at baseline.    Hypokalemia and hypomagnesemia Replaced.   Left knee pain:  Reported from a fall 4 weeks ago,  negative x rays, pt unable to ambulate due to severe pain.  PT evaluation. Pain control.      DVT prophylaxis: (Heparin ) code Status: (Full code.  Family Communication:  None at bedside.  Disposition:   Status is: Inpatient.   The patient will require care spanning > 2 midnights and should be moved to inpatient because: Ongoing diagnostic testing needed not appropriate for outpatient work up  Dispo: The patient is from: Home              Anticipated d/c is to: Home              Anticipated d/c date is: 1 day              Patient currently is not medically stable to d/c.       Consultants:   GI Dr Paulita Fujita.    Procedures: EGD showing gastritis    Esophagogram No gross esophageal abnormality identified on this limited single contrast barium esophagram.   Antimicrobials: None    Subjective: Nausea, no vomiting. No fevers or chills.   Objective: Vitals:   07/26/20 2207 07/27/20 0143 07/27/20 0546 07/27/20 1004  BP: (!) 168/91 133/77 (!) 145/82 (!) 150/76  Pulse: 100 (!) 101 100 85  Resp: 16 16 18 16   Temp: 97.8 F (36.6 C) 98.5 F (36.9 C) 97.9 F (36.6 C) 98.3 F (36.8 C)  TempSrc: Oral Oral Oral Oral  SpO2: 98% 94% 96% 100%  Weight:      Height:        Intake/Output Summary (Last 24 hours) at 07/27/2020 1242 Last data filed at 07/27/2020 1212 Gross per 24 hour  Intake 1081.64 ml  Output 1000 ml  Net 81.64 ml   Filed Weights   07/25/20 1602  Weight:  73.5 kg    Examination:  General exam: alert and mildly uncomfortable.  Respiratory system: Clear to auscultation bilaterally, no wheezing or rhonchi Cardiovascular system: S1-S2 heard, regular rate rhythm, no JVD, no pedal edema Gastrointestinal system: Abdomen is soft, mildly tender generalized, bowel sounds heard. Central nervous system: Alert and oriented, grossly nonfocal Extremities: No pedal edema, cyanosis Skin: No rashes seen Psychiatry:  Anxious     Data Reviewed: I have personally  reviewed following labs and imaging studies  CBC: Recent Labs  Lab 07/25/20 0832 07/26/20 0355 07/27/20 1113  WBC 3.6* 4.5 4.3  NEUTROABS 2.0  --   --   HGB 11.2* 11.7* 10.7*  HCT 32.2* 35.2* 32.7*  MCV 92.8 95.9 95.9  PLT 198 203 782    Basic Metabolic Panel: Recent Labs  Lab 07/25/20 0832 07/26/20 0355 07/26/20 1032 07/27/20 1113  NA 141 139 140 139  K 3.4* 2.9* 3.4* 3.5  CL 101 98 101 102  CO2 25 25 26 27   GLUCOSE 129* 120* 115* 146*  BUN 24* 18 17 15   CREATININE 1.49* 1.46* 1.34* 1.42*  CALCIUM 8.6* 9.1 8.9 8.9  MG  --   --  1.5* 2.0  PHOS  --   --   --  3.0    GFR: Estimated Creatinine Clearance: 35.6 mL/min (A) (by C-G formula based on SCr of 1.42 mg/dL (H)).  Liver Function Tests: Recent Labs  Lab 07/25/20 0832 07/26/20 0355  AST 23 21  ALT 10 13  ALKPHOS 77 80  BILITOT 1.4* 0.9  PROT 6.2* 6.7  ALBUMIN 3.4* 3.6    CBG: Recent Labs  Lab 07/26/20 1143 07/26/20 1629 07/26/20 2205 07/27/20 0739 07/27/20 1200  GLUCAP 107* 122* 123* 142* 139*     Recent Results (from the past 240 hour(s))  Respiratory Panel by RT PCR (Flu A&B, Covid) - Nasopharyngeal Swab     Status: None   Collection Time: 07/25/20  1:40 PM   Specimen: Nasopharyngeal Swab  Result Value Ref Range Status   SARS Coronavirus 2 by RT PCR NEGATIVE NEGATIVE Final    Comment: (NOTE) SARS-CoV-2 target nucleic acids are NOT DETECTED.  The SARS-CoV-2 RNA is generally detectable in upper respiratoy specimens during the acute phase of infection. The lowest concentration of SARS-CoV-2 viral copies this assay can detect is 131 copies/mL. A negative result does not preclude SARS-Cov-2 infection and should not be used as the sole basis for treatment or other patient management decisions. A negative result may occur with  improper specimen collection/handling, submission of specimen other than nasopharyngeal swab, presence of viral mutation(s) within the areas targeted by this assay,  and inadequate number of viral copies (<131 copies/mL). A negative result must be combined with clinical observations, patient history, and epidemiological information. The expected result is Negative.  Fact Sheet for Patients:  PinkCheek.be  Fact Sheet for Healthcare Providers:  GravelBags.it  This test is no t yet approved or cleared by the [place] FDA and  has been authorized for detection and/or diagnosis of SARS-CoV-2 by FDA under an Emergency Use Authorization (EUA). This EUA will remain  in effect (meaning this test can be used) for the duration of the COVID-19 declaration under Section 564(b)(1) of the Act, 21 U.S.C. section 360bbb-3(b)(1), unless the authorization is terminated or revoked sooner.     Influenza A by PCR NEGATIVE NEGATIVE Final   Influenza B by PCR NEGATIVE NEGATIVE Final    Comment: (NOTE) The Xpert Xpress SARS-CoV-2/FLU/RSV assay is intended as  an aid in  the diagnosis of influenza from Nasopharyngeal swab specimens and  should not be used as a sole basis for treatment. Nasal washings and  aspirates are unacceptable for Xpert Xpress SARS-CoV-2/FLU/RSV  testing.  Fact Sheet for Patients: PinkCheek.be  Fact Sheet for Healthcare Providers: GravelBags.it  This test is not yet approved or cleared by the [place] FDA and  has been authorized for detection and/or diagnosis of SARS-CoV-2 by  FDA under an Emergency Use Authorization (EUA). This EUA will remain  in effect (meaning this test can be used) for the duration of the  Covid-19 declaration under Section 564(b)(1) of the Act, 21  U.S.C. section 360bbb-3(b)(1), unless the authorization is  terminated or revoked. Performed at [facility], [place] [address], [place], [place] [address]          Radiology Studies: DG ESOPHAGUS W SINGLE CM (SOL OR THIN  BA)  Result Date: 07/25/2020 CLINICAL DATA:  Dysphagia.  Nausea after eating/drinking. EXAM: ESOPHOGRAM/BARIUM SWALLOW TECHNIQUE: Single contrast examination was performed using  thin barium. FLUOROSCOPY TIME:  Fluoroscopy Time:  2 minutes and 36 seconds. Radiation Exposure Index (if provided by the fluoroscopic device): 86 mGy Number of Acquired Spot Images: COMPARISON:  None. FINDINGS: Limited study due to patient immobility. Patient was placed on the fluoro table which was moved to a 45-60 degree head up position with assessment performed with the patient in both oblique positions. Patient was given sips of thin barium by mouth and monitored fluoroscopically while swallowing. She did have some discomfort on the initial swallowing phase with barium, but not so much with water. No evidence for gross esophageal mass lesion, diverticulum, or gross mucosal ulceration. No stricture evident. Patient was given a 13 mm barium tablet. This passed into the stomach after repeated swallows of water. IMPRESSION: No gross esophageal abnormality identified on this limited single contrast barium esophagram. Electronically Signed   By: Misty Stanley M.D.   On: 07/25/2020 13:20        Scheduled Meds: . DULoxetine  30 mg Oral Daily  . escitalopram  10 mg Oral Daily  . heparin  5,000 Units Subcutaneous Q8H  . insulin aspart  0-6 Units Subcutaneous TID WC  . leflunomide  20 mg Oral Daily  . metoCLOPramide (REGLAN) injection  5 mg Intravenous Q8H  . oxybutynin  15 mg Oral QHS  . pantoprazole  40 mg Oral QHS  . rosuvastatin  10 mg Oral Daily  . traZODone  100 mg Oral QHS   Continuous Infusions:    LOS: 1 day       Hosie Poisson, MD [facility]   To contact the attending provider between 7A-7P or the covering provider during after hours 7P-7A, please log into the web site [email or web address] and access using universal  password for that web site. If you do not have the password, please call  the hospital operator.  07/27/2020, 12:42 PM

## 2020-07-27 NOTE — Progress Notes (Signed)
Subjective: Less abdominal pain. More nausea.  Objective: Vital signs in last 24 hours: Temp:  [97.8 F (36.6 C)-98.6 F (37 C)] 98.3 F (36.8 C) (09/26 1004) Pulse Rate:  [77-101] 85 (09/26 1004) Resp:  [8-18] 16 (09/26 1004) BP: (133-168)/(70-91) 150/76 (09/26 1004) SpO2:  [94 %-100 %] 100 % (09/26 1004) Weight change:  Last BM Date: 07/21/20  PE: GEN:  NAD ABD:  Soft, minimal epigastric tenderness  Lab Results: CBC    Component Value Date/Time   WBC 4.3 07/27/2020 1113   RBC 3.41 (L) 07/27/2020 1113   HGB 10.7 (L) 07/27/2020 1113   HGB 10.9 (L) 01/30/2018 1000   HCT 32.7 (L) 07/27/2020 1113   HCT 30.8 (L) 01/30/2018 1000   PLT 156 07/27/2020 1113   PLT 226 01/30/2018 1000   MCV 95.9 07/27/2020 1113   MCV 91 01/30/2018 1000   MCH 31.4 07/27/2020 1113   MCHC 32.7 07/27/2020 1113   RDW 13.8 07/27/2020 1113   RDW 14.8 01/30/2018 1000   LYMPHSABS 1.1 07/25/2020 0832   LYMPHSABS 1.7 01/30/2018 1000   MONOABS 0.5 07/25/2020 0832   EOSABS 0.1 07/25/2020 0832   EOSABS 0.3 01/30/2018 1000   BASOSABS 0.0 07/25/2020 0832   BASOSABS 0.0 01/30/2018 1000   CMP     Component Value Date/Time   NA 139 07/27/2020 1113   NA 143 01/02/2019 1258   K 3.5 07/27/2020 1113   CL 102 07/27/2020 1113   CO2 27 07/27/2020 1113   GLUCOSE 146 (H) 07/27/2020 1113   BUN 15 07/27/2020 1113   BUN 31 (H) 01/02/2019 1258   CREATININE 1.42 (H) 07/27/2020 1113   CREATININE 1.30 (H) 09/08/2016 1035   CALCIUM 8.9 07/27/2020 1113   PROT 6.7 07/26/2020 0355   PROT 7.6 01/02/2019 1258   ALBUMIN 3.6 07/26/2020 0355   ALBUMIN 4.3 01/02/2019 1258   AST 21 07/26/2020 0355   ALT 13 07/26/2020 0355   ALKPHOS 80 07/26/2020 0355   BILITOT 0.9 07/26/2020 0355   BILITOT 0.3 01/02/2019 1258   GFRNONAA 38 (L) 07/27/2020 1113   GFRNONAA 36 (L) 07/10/2014 1354   GFRAA 44 (L) 07/27/2020 1113   GFRAA 41 (L) 07/10/2014 1354   Assessment:  1.  Upper abdominal pain.  EGD, CT scan, labs, all without  clear etiology.  Improving. 2.  Gastritis on endoscopy; unclear whether contributing to patient's GI symptoms. 3.  Nausea.  Plan:  1.  Would minimize use of narcotics, to help decrease patient's nausea. 2.  Empiric antiemetics.  Continue PPI. 3.  Awaiting gastric biopsies, specifically to assess for H. Pylori. 4.  Soft diet for now. 5.  From GI perspective, don't see any need for continued hospitalization, once patient's nausea improves and able to tolerate soft diet. 6.  [facility] will follow.   [name] 07/27/2020, 1:19 PM   Cell [phone] If no answer or after 5 PM call [phone]

## 2020-07-27 NOTE — Progress Notes (Signed)
Initial Nutrition Assessment  DOCUMENTATION CODES:   Not applicable  INTERVENTION:  Provide Ensure Enlive po TID, each supplement provides 350 kcal and 20 grams of protein.  Provide multivitamin once daily.   Monitor magnesium, potassium, and phosphorus daily for at least 3 days, MD to replete as needed, as pt is at risk for refeeding syndrome.  NUTRITION DIAGNOSIS:   Inadequate oral intake related to nausea as evidenced by meal completion < 25%.  GOAL:   Patient will meet greater than or equal to 90% of their needs  MONITOR:   PO intake, Supplement acceptance, Skin, Weight trends, Labs, I & O's  REASON FOR ASSESSMENT:   Consult Assessment of nutrition requirement/status  ASSESSMENT:   69 year old lady prior history of rheumatoid arthritis, stage IIIa CKD, chronic pain syndrome, diabetes mellitus, GERD, hyperlipidemia, peripheral neuropathy, history of PUD, depression presents with loss of appetite, dysphagia, unintentional weight loss and abdominal pain. EDG done on 07/26/20 showed patchy mild inflammation in the stomach. EGD showing gastritis.   Pt unavailable during attempted time of contact. Per MD, pt with nausea. Abdominal pain has decreased. Meal completion has been 20%. Per weight records, pt with a 14% weight loss over the past 1 month, which is significant for time frame. Pt at risk for refeeding syndrome. RD to order nutritional supplements to aid in caloric and protein needs.   Unable to complete Nutrition-Focused physical exam at this time. RD working remotely.  Labs and medications reviewed.   Diet Order:   Diet Order            DIET SOFT Room service appropriate? Yes; Fluid consistency: Thin  Diet effective now                 EDUCATION NEEDS:   Not appropriate for education at this time  Skin:  Skin Assessment: Reviewed RN Assessment  Last BM:  9/20  Height:   Ht Readings from Last 1 Encounters:  07/25/20 5\' 2"  (1.575 m)    Weight:   Wt  Readings from Last 1 Encounters:  07/25/20 73.5 kg    BMI:  Body mass index is 29.63 kg/m.  Estimated Nutritional Needs:   Kcal:  1610-9604  Protein:  85-95 grams  Fluid:  >/= 1.7  L/day   Corrin Parker, MS, RD, LDN RD pager number/after hours weekend pager number on Amion.

## 2020-07-27 NOTE — Evaluation (Signed)
Physical Therapy Evaluation Patient Details Name: Angel French MRN: [id number] DOB: 08/25/1951 Today's Date: 07/27/2020   History of Present Illness  69 yo female admitted with abd pain, gastritis, nausea. Hx of RA, CKD, chronic pain, RM, neuropathy  Clinical Impression  On eval, pt was Min guard assist for mobility. She walked ~60 feet around the unit with use of a RW. Pt reports 5/10 L knee pain. She tolerated activity fairly well. Discussed d/c plan-pt plans to return home. Will recommend HHPT f/u if pt is agreeable. She mentioned possibly having imaging of L knee done during this hospitalization??    Follow Up Recommendations Home health PT    Equipment Recommendations  None recommended by PT    Recommendations for Other Services       Precautions / Restrictions Precautions Precautions: Fall Precaution Comments: hx of multiple falls- "I fall often." Restrictions Weight Bearing Restrictions: No      Mobility  Bed Mobility Overal bed mobility: Modified Independent                Transfers Overall transfer level: Needs assistance   Transfers: Sit to/from Stand Sit to Stand: Supervision            Ambulation/Gait Ambulation/Gait assistance: Min guard Gait Distance (Feet): 60 Feet Assistive device: Rolling walker (2 wheeled) Gait Pattern/deviations: Step-through pattern;Decreased stride length;Antalgic;Decreased stance time - left     General Gait Details: slow gait speed. gait mildly antalgic 2* L knee pain  Stairs            Wheelchair Mobility    Modified Rankin (Stroke Patients Only)       Balance Overall balance assessment: Needs assistance;History of Falls         Standing balance support: Bilateral upper extremity supported Standing balance-Leahy Scale: Fair                               Pertinent Vitals/Pain Pain Assessment: 0-10 Pain Score: 5  Pain Location: L knee Pain Intervention(s): Monitored during  session;Repositioned    Home Living Family/patient expects to be discharged to:: Private residence Living Arrangements: Children;Other relatives   Type of Home: House       Home Layout: Bed/bath upstairs Home Equipment: Walker - 2 wheels      Prior Function Level of Independence: Independent with assistive device(s)               Hand Dominance        Extremity/Trunk Assessment   Upper Extremity Assessment Upper Extremity Assessment: Overall WFL for tasks assessed    Lower Extremity Assessment Lower Extremity Assessment: Generalized weakness    Cervical / Trunk Assessment Cervical / Trunk Assessment: Normal  Communication   Communication: No difficulties  Cognition Arousal/Alertness: Awake/alert Behavior During Therapy: WFL for tasks assessed/performed Overall Cognitive Status: Within Functional Limits for tasks assessed                                        General Comments      Exercises     Assessment/Plan    PT Assessment Patient needs continued PT services  PT Problem List Decreased strength;Decreased mobility;Decreased activity tolerance;Decreased balance;Pain       PT Treatment Interventions DME instruction;Gait training;Therapeutic activities;Therapeutic exercise;Patient/family education;Balance training;Functional mobility training    PT Goals (Current goals can be found  in the Care Plan section)  Acute Rehab PT Goals Patient Stated Goal: to feel better overall PT Goal Formulation: With patient Time For Goal Achievement: 08/10/20 Potential to Achieve Goals: Good    Frequency Min 3X/week   Barriers to discharge        Co-evaluation               AM-PAC PT "6 Clicks" Mobility  Outcome Measure Help needed turning from your back to your side while in a flat bed without using bedrails?: None Help needed moving from lying on your back to sitting on the side of a flat bed without using bedrails?: None Help  needed moving to and from a bed to a chair (including a wheelchair)?: A Little Help needed standing up from a chair using your arms (e.g., wheelchair or bedside chair)?: A Little Help needed to walk in hospital room?: A Little Help needed climbing 3-5 steps with a railing? : A Little 6 Click Score: 20    End of Session Equipment Utilized During Treatment: Gait belt Activity Tolerance: Patient tolerated treatment well Patient left: in bed;with call bell/phone within reach;with bed alarm set   PT Visit Diagnosis: Pain;Unsteadiness on feet (R26.81);History of falling (Z91.81);Repeated falls (R29.6) Pain - Right/Left: Left Pain - part of body: Knee    Time: 1340-1401 PT Time Calculation (min) (ACUTE ONLY): 21 min   Charges:   PT Evaluation $PT Eval Low Complexity: 1 Low            Doreatha Massed, PT Acute Rehabilitation  Office: [phone] Pager: [phone]

## 2020-07-28 ENCOUNTER — Encounter (HOSPITAL_COMMUNITY): Payer: Self-pay | Admitting: Gastroenterology

## 2020-07-28 ENCOUNTER — Other Ambulatory Visit: Payer: Medicare Other

## 2020-07-28 ENCOUNTER — Inpatient Hospital Stay (HOSPITAL_COMMUNITY): Payer: Medicare Other

## 2020-07-28 LAB — GLUCOSE, CAPILLARY
Glucose-Capillary: 133 mg/dL — ABNORMAL HIGH (ref 70–99)
Glucose-Capillary: 155 mg/dL — ABNORMAL HIGH (ref 70–99)
Glucose-Capillary: 141 mg/dL — ABNORMAL HIGH (ref 70–99)
Glucose-Capillary: 112 mg/dL — ABNORMAL HIGH (ref 70–99)

## 2020-07-28 MED ORDER — HYDROCODONE-ACETAMINOPHEN 10-325 MG PO TABS
1.0000 | ORAL_TABLET | Freq: Once | ORAL | Status: AC
  Administered 2020-07-28: 1 via ORAL
  Filled 2020-07-28: qty 1

## 2020-07-28 NOTE — Progress Notes (Signed)
Subjective: Patient complains of continued nausea, upper abdominal discomfort and bloating. Reports that symptoms are improved with use of Reglan.  Objective: Vital signs in last 24 hours: Temp:  [98.2 F (36.8 C)-98.7 F (37.1 C)] 98.6 F (37 C) (09/27 1339) Pulse Rate:  [90-102] 90 (09/27 1339) Resp:  [15-16] 15 (09/27 1339) BP: (135-153)/(62-78) 136/62 (09/27 1339) SpO2:  [95 %-99 %] 99 % (09/27 1339) Weight change:  Last BM Date: 07/26/20  PE: Not in distress, mild anemia, no icterus GENERAL: Able to speak in full sentences ABDOMEN: Soft, nondistended, nontender, normoactive bowel sounds EXTREMITIES: No deformity  Lab Results: Results for orders placed or performed during the hospital encounter of 07/25/20 (from the past 48 hour(s))  Glucose, capillary     Status: Abnormal   Collection Time: 07/26/20  4:29 PM  Result Value Ref Range   Glucose-Capillary 122 (H) 70 - 99 mg/dL    Comment: Glucose reference range applies only to samples taken after fasting for at least 8 hours.  Glucose, capillary     Status: Abnormal   Collection Time: 07/26/20 10:05 PM  Result Value Ref Range   Glucose-Capillary 123 (H) 70 - 99 mg/dL    Comment: Glucose reference range applies only to samples taken after fasting for at least 8 hours.  Glucose, capillary     Status: Abnormal   Collection Time: 07/27/20  7:39 AM  Result Value Ref Range   Glucose-Capillary 142 (H) 70 - 99 mg/dL    Comment: Glucose reference range applies only to samples taken after fasting for at least 8 hours.  CBC     Status: Abnormal   Collection Time: 07/27/20 11:13 AM  Result Value Ref Range   WBC 4.3 4.0 - 10.5 K/uL   RBC 3.41 (L) 3.87 - 5.11 MIL/uL   Hemoglobin 10.7 (L) 12.0 - 15.0 g/dL   HCT 32.7 (L) 36 - 46 %   MCV 95.9 80.0 - 100.0 fL   MCH 31.4 26.0 - 34.0 pg   MCHC 32.7 30.0 - 36.0 g/dL   RDW 13.8 11.5 - 15.5 %   Platelets 156 150 - 400 K/uL   nRBC 0.0 0.0 - 0.2 %    Comment: Performed at [facility], [place] [address], [place], [place] [address]  Basic metabolic panel     Status: Abnormal   Collection Time: 07/27/20 11:13 AM  Result Value Ref Range   Sodium 139 135 - 145 mmol/L   Potassium 3.5 3.5 - 5.1 mmol/L   Chloride 102 98 - 111 mmol/L   CO2 27 22 - 32 mmol/L   Glucose, Bld 146 (H) 70 - 99 mg/dL    Comment: Glucose reference range applies only to samples taken after fasting for at least 8 hours.   BUN 15 8 - 23 mg/dL   Creatinine, Ser 1.42 (H) 0.44 - 1.00 mg/dL   Calcium 8.9 8.9 - 10.3 mg/dL   GFR calc non Af Amer 38 (L) >60 mL/min   GFR calc Af Amer 44 (L) >60 mL/min   Anion gap 10 5 - 15    Comment: Performed at [facility], [place] [address], [place], [place] [address]  Magnesium     Status: None   Collection Time: 07/27/20 11:13 AM  Result Value Ref Range   Magnesium 2.0 1.7 - 2.4 mg/dL    Comment: Performed at [facility], [place] [address], [place], [place] [address]  Phosphorus     Status:  None   Collection Time: 07/27/20 11:13 AM  Result Value Ref Range   Phosphorus 3.0 2.5 - 4.6 mg/dL    Comment: Performed at [facility], [place] [address], [place], [place] [address]  Glucose, capillary     Status: Abnormal   Collection Time: 07/27/20 12:00 PM  Result Value Ref Range   Glucose-Capillary 139 (H) 70 - 99 mg/dL    Comment: Glucose reference range applies only to samples taken after fasting for at least 8 hours.  Glucose, capillary     Status: None   Collection Time: 07/27/20  4:22 PM  Result Value Ref Range   Glucose-Capillary 89 70 - 99 mg/dL    Comment: Glucose reference range applies only to samples taken after fasting for at least 8 hours.  Glucose, capillary     Status: Abnormal   Collection Time: 07/27/20  8:59 PM  Result Value Ref Range   Glucose-Capillary 125 (H) 70 - 99 mg/dL    Comment: Glucose reference range applies only to samples taken after fasting for at least 8 hours.   Glucose, capillary     Status: Abnormal   Collection Time: 07/28/20  7:23 AM  Result Value Ref Range   Glucose-Capillary 133 (H) 70 - 99 mg/dL    Comment: Glucose reference range applies only to samples taken after fasting for at least 8 hours.  Glucose, capillary     Status: Abnormal   Collection Time: 07/28/20 11:33 AM  Result Value Ref Range   Glucose-Capillary 155 (H) 70 - 99 mg/dL    Comment: Glucose reference range applies only to samples taken after fasting for at least 8 hours.    Studies/Results: MR KNEE LEFT WO CONTRAST  Result Date: 07/28/2020 CLINICAL DATA:  Chronic left knee pain, fall 1 month ago EXAM: MRI OF THE LEFT KNEE WITHOUT CONTRAST TECHNIQUE: Multiplanar, multisequence MR imaging of the knee was performed. No intravenous contrast was administered. COMPARISON:  X-ray 06/29/2020 FINDINGS: MENISCI Medial meniscus: Intermediate intrasubstance signal within the posterior horn and body segments of the medial meniscus. No discrete fluid intensity component extending to an articular surface to suggest tear. Lateral meniscus: Subtle oblique tear of the lateral meniscal anterior horn extending to the superior articular surface (series 13, image 15). LIGAMENTS Cruciates: Mucoid degeneration cruciate ligaments without evidence of tear. Collaterals: Medial collateral ligament is intact. Lateral collateral ligament complex is intact. CARTILAGE Patellofemoral: High-grade cartilage loss involving the medial trochlea and central trochlear groove. Mild patellar chondral thinning. Medial:  No chondral defect. Lateral:  No chondral defect. Joint:  Trace joint effusion.  Fat pads unremarkable. Popliteal Fossa:  Trace Baker's cyst.  Intact popliteus tendon. Extensor Mechanism:  Intact quadriceps tendon and patellar tendon. Bones: Reactive subchondral marrow signal changes within the trochlea. No acute fracture. No malalignment. No suspicious bone lesion. Other: None. IMPRESSION: 1. Subtle oblique  tear of the lateral meniscal anterior horn. 2. High-grade cartilage loss involving the medial trochlea and central trochlear groove. 3. Medial meniscal degeneration without discrete tear. 4. Mucoid degeneration cruciate ligaments. 5. Trace joint effusion and trace Baker's cyst. Electronically Signed   By: Davina Poke D.O.   On: 07/28/2020 11:30    Medications: I have reviewed the patient's current medications.  Assessment: Nausea, loss of appetite, 20 to 25 pound weight loss in 6 weeks, upper abdominal pain and bloating, dysphagia  Diabetes Normocytic anemia Renal impairment, BUN 15/creatinine 1.42/GFR 38  Status post EGD, biopsy pending, otherwise unremarkable Unremarkable barium swallow Unremarkable CAT scan  for abdominal pathology  Opioid dependence Constipation-on Linzess 290 mcg daily, 1 bowel movement once a week as per patient  Plan: Will await biopsy results to rule out H. Pylori, although her symptoms are vague and multiple. Possible underlying gastroparesis, with history of diabetes and narcotic use. Will order gastric emptying scan for further evaluation.  Ronnette Juniper, MD 07/28/2020, 3:07 PM

## 2020-07-28 NOTE — Progress Notes (Signed)
PROGRESS NOTE    Angel French  MLY:[id number] DOB: 03/08/1951 DOA: 07/25/2020 PCP: Wardell Honour, MD    Chief Complaint  Patient presents with  . Abdominal Pain    Brief Narrative:  69 year old lady prior history of rheumatoid arthritis, stage IIIa CKD, chronic pain syndrome, diabetes mellitus, GERD, hyperlipidemia, peripheral neuropathy, history of PUD, depression presents with loss of appetite, dysphagia, unintentional weight loss and abdominal pain. EDG done on 07/26/20 showed patchy mild inflammation in the stomach.  Biopsy results for H. pylori are still pending.  Meanwhile patient reports that she has left knee pain since 1 month since the fall.  MRI of the knee shows possible lateral meniscal tear of the left knee.  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  She is scheduled for intra-articular steroid injection to help with inflammation.  Weightbearing as tolerated and PT evaluation and knee brace. Patient seen and examined at bedside she continues to have nausea, but no vomiting or abd pain and reports her nausea is better with reglan.     Assessment & Plan:   Active Problems:   Abdominal pain   Dysphagia   Dysphagia, anorexia, unintentional weight loss, abdominal pain Unclear etiology so far. CT of the abdomen and pelvis without contrast showed Cystic area in the LEFT adnexa not significantly changed from previous imaging. Further evaluation by gastroenterology.  EGD done  showed mild patchy inflammation in the stomach.  Biopsy results for evaluation of H. pylori which is still pending Continue with PPI. GI  recommends gastric emptying study tomorrow.  Meanwhile continue with IV Phenergan and IV Reglan.      Essential hypertension Blood pressure parameters are well controlled  Diabetes mellitus Continue with sliding scale insulin. CBG (last 3)  Recent Labs    07/27/20 2059 07/28/20 0723 07/28/20 1133  GLUCAP 125* 133* 155*   No changes in medications   Depression Continue with Cymbalta and Lexapro.   Mild anemia of chronic disease Hemoglobin stable around 11.    Stage IIIa CKD Creatinine appears to be at baseline around 1.4    Hypokalemia and hypomagnesemia Replaced.   Left knee pain:  Reported from a fall 4 weeks ago, negative x rays, pt unable to ambulate due to severe pain.  PT evaluation recommending home health PT.   Anemia of chronic disease  Baseline hemoglobin of 11, hemoglobin is around 10.     Hyperlipidemia:  Continue with crestor.    DVT prophylaxis: (Heparin ) code Status: (Full code.  Family Communication:  None at bedside.  Disposition:   Status is: Inpatient.   The patient will require care spanning > 2 midnights and should be moved to inpatient because: Ongoing diagnostic testing needed not appropriate for outpatient work up, symptom management.   Dispo: The patient is from: Home              Anticipated d/c is to: Home              Anticipated d/c date is: 1 day              Patient currently is not medically stable to d/c.       Consultants:   GI Dr Paulita Fujita.    Procedures: EGD showing gastritis    Esophagogram No gross esophageal abnormality identified on this limited single contrast barium esophagram.   Antimicrobials: None  Subjective: Persistent nausea,   Objective: Vitals:   07/27/20 1729 07/27/20 2058 07/28/20 0527 07/28/20 1339  BP: 135/69 (!) 148/78 (!) 153/76 136/62  Pulse: (!) 101 (!) 102 90 90  Resp: 16 16 15 15   Temp: 98.5 F (36.9 C) 98.7 F (37.1 C) 98.2 F (36.8 C) 98.6 F (37 C)   TempSrc: Oral Oral Oral Oral  SpO2: 98% 95% 98% 99%  Weight:      Height:        Intake/Output Summary (Last 24 hours) at 07/28/2020 1643 Last data filed at 07/28/2020 1620 Gross per 24 hour  Intake 650 ml  Output 1200 ml  Net -550 ml   Filed Weights   07/25/20 1602  Weight: 73.5 kg    Examination:  General exam: Alert, not in any kind of distress Respiratory system: Clear to auscultation bilaterally, no wheezing or rhonchi Cardiovascular system: S1-S2 heard, regular rate rhythm, no JVD, no pedal edema Gastrointestinal system: Abdomen is soft, nontender, bowel sounds normal Central nervous system: Alert and oriented, grossly nonfocal Extremities: No pedal edema, cyanosis, tender on the medial aspect of the knee on the left Skin: No rashes seen Psychiatry: Anxious   Data Reviewed: I have personally reviewed following labs and imaging studies  CBC: Recent Labs  Lab 07/25/20 0832 07/26/20 0355 07/27/20 1113  WBC 3.6* 4.5 4.3  NEUTROABS 2.0  --   --   HGB 11.2* 11.7* 10.7*  HCT 32.2* 35.2* 32.7*  MCV 92.8 95.9 95.9  PLT 198 203 161    Basic Metabolic Panel: Recent Labs  Lab 07/25/20 0832 07/26/20 0355 07/26/20 1032 07/27/20 1113  NA 141 139 140 139  K 3.4* 2.9* 3.4* 3.5  CL 101 98 101 102  CO2 25 25 26 27   GLUCOSE 129* 120* 115* 146*  BUN 24* 18 17 15   CREATININE 1.49* 1.46* 1.34* 1.42*  CALCIUM 8.6* 9.1 8.9 8.9  MG  --   --  1.5* 2.0  PHOS  --   --   --  3.0    GFR: Estimated Creatinine Clearance: 35.6 mL/min (A) (by C-G formula based on SCr of 1.42 mg/dL (H)).  Liver Function Tests: Recent Labs  Lab 07/25/20 0832 07/26/20 0355  AST 23 21  ALT 10 13  ALKPHOS 77 80  BILITOT 1.4* 0.9  PROT 6.2* 6.7  ALBUMIN 3.4* 3.6    CBG: Recent Labs  Lab 07/27/20 1200 07/27/20 1622 07/27/20 2059 07/28/20 0723 07/28/20 1133  GLUCAP 139* 89 125* 133* 155*     Recent Results (from the past 240 hour(s))  Respiratory Panel by RT PCR (Flu A&B,  Covid) - Nasopharyngeal Swab     Status: None   Collection Time: 07/25/20  1:40 PM   Specimen: Nasopharyngeal Swab  Result Value Ref Range Status   SARS Coronavirus 2 by RT PCR NEGATIVE NEGATIVE Final    Comment: (NOTE) SARS-CoV-2 target nucleic acids are NOT DETECTED.  The SARS-CoV-2 RNA is generally detectable in upper respiratoy specimens during the acute phase of infection. The lowest concentration of SARS-CoV-2 viral copies this assay can detect is 131 copies/mL. A negative result does not preclude SARS-Cov-2 infection and should not be used as the sole basis for treatment or other patient management decisions. A negative result may occur with  improper specimen collection/handling, submission of specimen other than nasopharyngeal swab, presence of viral mutation(s) within the areas targeted by this assay, and inadequate number of viral copies (<131 copies/mL). A negative result must be combined with  clinical observations, patient history, and epidemiological information. The expected result is Negative.  Fact Sheet for Patients:  PinkCheek.be  Fact Sheet for Healthcare Providers:  GravelBags.it  This test is no t yet approved or cleared by the [place] FDA and  has been authorized for detection and/or diagnosis of SARS-CoV-2 by FDA under an Emergency Use Authorization (EUA). This EUA will remain  in effect (meaning this test can be used) for the duration of the COVID-19 declaration under Section 564(b)(1) of the Act, 21 U.S.C. section 360bbb-3(b)(1), unless the authorization is terminated or revoked sooner.     Influenza A by PCR NEGATIVE NEGATIVE Final   Influenza B by PCR NEGATIVE NEGATIVE Final    Comment: (NOTE) The Xpert Xpress SARS-CoV-2/FLU/RSV assay is intended as an aid in  the diagnosis of influenza from Nasopharyngeal swab specimens and  should not be used as a sole basis for treatment. Nasal  washings and  aspirates are unacceptable for Xpert Xpress SARS-CoV-2/FLU/RSV  testing.  Fact Sheet for Patients: PinkCheek.be  Fact Sheet for Healthcare Providers: GravelBags.it  This test is not yet approved or cleared by the [place] FDA and  has been authorized for detection and/or diagnosis of SARS-CoV-2 by  FDA under an Emergency Use Authorization (EUA). This EUA will remain  in effect (meaning this test can be used) for the duration of the  Covid-19 declaration under Section 564(b)(1) of the Act, 21  U.S.C. section 360bbb-3(b)(1), unless the authorization is  terminated or revoked. Performed at [facility], [place] [address], [place], [place] [address]          Radiology Studies: MR KNEE LEFT WO CONTRAST  Result Date: 07/28/2020 CLINICAL DATA:  Chronic left knee pain, fall 1 month ago EXAM: MRI OF THE LEFT KNEE WITHOUT CONTRAST TECHNIQUE: Multiplanar, multisequence MR imaging of the knee was performed. No intravenous contrast was administered. COMPARISON:  X-ray 06/29/2020 FINDINGS: MENISCI Medial meniscus: Intermediate intrasubstance signal within the posterior horn and body segments of the medial meniscus. No discrete fluid intensity component extending to an articular surface to suggest tear. Lateral meniscus: Subtle oblique tear of the lateral meniscal anterior horn extending to the superior articular surface (series 13, image 15). LIGAMENTS Cruciates: Mucoid degeneration cruciate ligaments without evidence of tear. Collaterals: Medial collateral ligament is intact. Lateral collateral ligament complex is intact. CARTILAGE Patellofemoral: High-grade cartilage loss involving the medial trochlea and central trochlear groove. Mild patellar chondral thinning. Medial:  No chondral defect. Lateral:  No chondral defect. Joint:  Trace joint effusion.  Fat pads unremarkable. Popliteal Fossa:  Trace Baker's  cyst.  Intact popliteus tendon. Extensor Mechanism:  Intact quadriceps tendon and patellar tendon. Bones: Reactive subchondral marrow signal changes within the trochlea. No acute fracture. No malalignment. No suspicious bone lesion. Other: None. IMPRESSION: 1. Subtle oblique tear of the lateral meniscal anterior horn. 2. High-grade cartilage loss involving the medial trochlea and central trochlear groove. 3. Medial meniscal degeneration without discrete tear. 4. Mucoid degeneration cruciate ligaments. 5. Trace joint effusion and trace Baker's cyst. Electronically Signed   By: Davina Poke D.O.   On: 07/28/2020 11:30        Scheduled Meds: . amLODipine  5 mg Oral Daily  . DULoxetine  30 mg Oral Daily  . escitalopram  10 mg Oral Daily  . feeding supplement (ENSURE ENLIVE)  237 mL Oral TID BM  . heparin  5,000 Units Subcutaneous Q8H  . insulin aspart  0-6 Units Subcutaneous TID WC  . leflunomide  20 mg Oral Daily  . metoCLOPramide (REGLAN) injection  5 mg Intravenous Q8H  . oxybutynin  15 mg Oral QHS  . pantoprazole  40 mg Oral QHS  . rosuvastatin  10 mg Oral Daily  . traZODone  100 mg Oral QHS   Continuous Infusions:    LOS: 2 days       Hosie Poisson, MD [facility]   To contact the attending provider between 7A-7P or the covering provider during after hours 7P-7A, please log into the web site [email or web address] and access using universal [place] password for that web site. If you do not have the password, please call the hospital operator.  07/28/2020, 4:43 PM

## 2020-07-28 NOTE — Progress Notes (Signed)
Orthopedic Tech Progress Note Patient Details:  Alicja Everitt [facility] 19-Feb-1951 [id number]  Ortho Devices Type of Ortho Device: Knee Sleeve Ortho Device/Splint Interventions: Application   Post Interventions Patient Tolerated: Well Instructions Provided: Care of device   [place] Pink 07/28/2020, 2:32 PM

## 2020-07-28 NOTE — Consult Note (Addendum)
ORTHOPAEDIC CONSULTATION  REQUESTING PHYSICIAN: Hosie Poisson, MD  Chief Complaint: left knee pain  HPI: Angel French is a 69 y.o. female with history of lumbar and right hip osteoarthritis, CKD stage 3, DM type 2, HTN, chronic pain syndrome, GERD, hyperlipidemia, peipheral neuropathy, and depression who complains of left knee pain. She is currently inpatient due to abdonimal pain, dysphasia, anorexia, and unintentional weight loss. She noted to PT that she had 5/10 left knee pain. She states that pain started one month ago after a fall. She went to urgent care, x-rays were performed, and she was sent home. States pain is diffusely over left knee with anterior left thigh pain, pain is worse with movement and she struggles with extension at the knee. Pain is better with rest. Patient has previously broken all toes in left foot and has had ongoing left foot pain since the injury, she feels unstable when walking due to this pain and walks with a walker at baseline.   Past Medical History:  Diagnosis Date  . Allergy    generic allergy pill; Spring and Fall only.  . Anxiety   . Arthritis    DDD lumbar, R hip OA.  s/p ortho consult in past.  . Blood transfusion without reported diagnosis    Mountain climbing accident in [place].  . Brachial plexus disorders   . Cataract    B retractions.  . Chronic kidney disease    stage 3 per pt.   . Chronic pain syndrome   . Chronic renal insufficiency, stage 3 (moderate)   . Constipation   . DDD (degenerative disc disease), lumbar   . Depression   . Diabetes mellitus   . Diabetic peripheral neuropathy associated with type 2 diabetes mellitus ([place])   . Diabetic retinopathy ([place])   . Diabetic retinopathy associated with type 2 diabetes mellitus ([place])    s/p laser treatment multiple.  Unable to drive.  . Fatty liver   . Fibromyalgia   . Food allergy   . GERD (gastroesophageal reflux disease)   . Hypercholesteremia   . Hyperlipidemia   .  Hypertension    controlled, off meds   . IBS (irritable bowel syndrome)   . Leg edema   . Neuromuscular disorder ([place])   . OSA (obstructive sleep apnea)   . Osteoarthritis   . Rheumatic fever   . Rheumatoid arthritis ([place])   . Stomach ulcer   . Swallowing difficulty   . TIA (transient ischemic attack)   . Ulcer    Peptic ulcer H. Pylori + s/p treatment.  Upper GI diagnosed.[name] Prilosec PRN .   Past Surgical History:  Procedure Laterality Date  .  2 SPINAL INJECTIONS     . ABDOMINAL HYSTERECTOMY  11/02/1979   DUB; cervical dysplasia; ovaries intact.  . ABDOMINAL SURGERY     staph abcess   . [facility] Admission     age 72; three months in [place].  Marland Kitchen BREAST BIOPSY    . CARDIAC CATHETERIZATION  11/02/2007   normal coronary arteries.  . CARPAL TUNNEL RELEASE     Bilateral.  . CATARACT EXTRACTION, BILATERAL    . CHOLECYSTECTOMY    . ESOPHAGEAL MANOMETRY N/A 09/14/2017   Procedure: ESOPHAGEAL MANOMETRY (EM);  Surgeon: Ronnette Juniper, MD;  Location: [facility];  Service: Gastroenterology;  Laterality: N/A;  . EYE SURGERY     Cataracts B. Laser surgery x 7 for Diabetic Retinopathy  . TONSILLECTOMY     Social History  Socioeconomic History  . Marital status: Widowed    Spouse name: Marijean Niemann  . Number of children: 2  . Years of education: college  . Highest education level: Not on file  Occupational History  . Occupation: retired    Comment: retretied  Tobacco Use  . Smoking status: Former Smoker    Years: 22.00  . Smokeless tobacco: Never Used  . Tobacco comment: Quit 1987  Substance and Sexual Activity  . Alcohol use: No    Alcohol/week: 0.0 standard drinks  . Drug use: No  . Sexual activity: Yes    Birth control/protection: Surgical, Post-menopausal    Comment: widow  Other Topics Concern  . Not on file  Social History Narrative   Marital status: widowed since 2009; dating x 6 years.  Happy; no abuse.      Children: 2 children (57 daughter, 79  son estranged); 2 grandchildren.      Lives: with boyfriend, daughter, granddaughter, friend of daughter.  Lives in pt house.      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  Diabetic retinopathy; unable to drive.      Tobacco:  Smoked x 20 years; quit 20 years.      Alcohol:  On special occasions; once per week on average.       Drugs:  None since college.      Exercise:  Walking several times per week; walks the dog.   Education college   Caffeine one cup daily.   Right handed      Advanced Directives: none; FULL CODE.  DNR/DNI.  HCPOA: [place]?           Social Determinants of Health   Financial Resource Strain:   . Difficulty of Paying Living Expenses: Not on file  Food Insecurity:   . Worried About Charity fundraiser in the Last Year: Not on file  . Ran Out of Food in the Last Year: Not on file  Transportation Needs:   . Lack of Transportation (Medical): Not on file  . Lack of Transportation (Non-Medical): Not on file  Physical Activity:   . Days of Exercise per Week: Not on file  . Minutes of Exercise per Session: Not on file  Stress:   . Feeling of Stress : Not on file  Social Connections:   . Frequency of Communication with Friends and Family: Not on file  . Frequency of Social Gatherings with Friends and Family: Not on file  . Attends Religious Services: Not on file  . Active Member of Clubs or Organizations: Not on file  . Attends Archivist Meetings: Not on file  . Marital Status: Not on file   Family History  Adopted: Yes  Problem Relation Age of Onset  . Breast cancer Daughter    Allergies  Allergen Reactions  . Codeine Anaphylaxis  . Contrast Media [Iodinated Diagnostic Agents] Anaphylaxis  . Nitrofurantoin Monohyd Macro Anaphylaxis  . Betadine [Povidone Iodine] Itching  . Folic Acid Itching  . Gabapentin Other (See Comments)    Makes patient feel drunk  . Iodine Hives  . Lyrica [Pregabalin] Other (See Comments)     Makes patient feel drunk  . Red Dye Itching  . Ultram [Tramadol Hcl] Nausea And Vomiting     Positive ROS: All other systems have been reviewed and were otherwise negative with the exception of those mentioned in the HPI and as above.  Physical Exam: General: Alert, no acute distress Cardiovascular: No  pedal edema Respiratory: No cyanosis, no use of accessory musculature GI: No organomegaly, abdomen is soft  Skin: No lesions over left knee Neurologic: Sensation intact distally Psychiatric: Patient is competent for consent with normal mood and affect Lymphatic: No axillary or cervical lymphadenopathy  MUSCULOSKELETAL: LLE - 45-100 degrees of AROM at left knee. Unable to perform straight leg raise. Severe medial joint line tenderness, no lateral joint line tenderness. No knee effusion. Not hot to touch. EHL and FHL intact.   MRI of L knee showed a "subtle oblique tear of lateral meniscal anterior horn, medial meniscal degeneration without tear, high-grade cartilage loss involving the medial trochlea and central trochlear groove".  Assessment/Plan: Left knee pain: - likely due more to patellofemoral osteoarthritis rather than meniscus tear seen on MRI, would not recommend arthroscopic surgery at this time - would recommend intraarticular steroid injection to help with inflammation caused by patellofemoral osteoarthritis - continue to monitor blood sugar levels as these can be increased with intraarticular steroid injection - WBAT, up with PT, continue PT with focus on quad strengthening exercises - per MRI quad and patellar tendons intact without tear - patient requesting knee brace, will order one for her, she can use as tolerated   PRE-PROCEDURE DIAGNOSIS:  L knee patellofemoral osteoarthritis POST-OPERATIVE DIAGNOSIS:  Same PROCEDURE:  Intra-articular injection left knee  PROCEDURE DETAILS:  After informed verbal consent was obtained the anteriolateral was prepped with  chlorhexidine.  2 ml of Marcaine and 1 ml of Depo-Medrol (40mg ) was then injected into the joint space. She tolerated this well without complication and a Band-Aid was placed.    Ventura Bruns, PA-C    07/28/2020 1:36 PM  Reviewed, discussed, agree with above.   Johnny Bridge, MD

## 2020-07-29 ENCOUNTER — Other Ambulatory Visit: Payer: Self-pay

## 2020-07-29 LAB — GLUCOSE, CAPILLARY: Glucose-Capillary: 120 mg/dL — ABNORMAL HIGH (ref 70–99)

## 2020-07-29 LAB — SURGICAL PATHOLOGY

## 2020-07-29 MED ORDER — ENSURE ENLIVE PO LIQD: 237.0000 mL | Freq: Three times a day (TID) | ORAL | 12 refills | Status: DC

## 2020-07-29 MED ORDER — ERYTHROMYCIN BASE 250 MG PO TABS
250.0000 mg | ORAL_TABLET | Freq: Three times a day (TID) | ORAL | Status: DC
  Administered 2020-07-29: 250 mg via ORAL
  Filled 2020-07-29 (×2): qty 1

## 2020-07-29 MED ORDER — AMLODIPINE BESYLATE 5 MG PO TABS
5.0000 mg | ORAL_TABLET | Freq: Every day | ORAL | 0 refills | Status: DC
Start: 2020-07-29 — End: 2021-12-31

## 2020-07-29 MED ORDER — ERYTHROMYCIN BASE 250 MG PO TABS: 250.0000 mg | ORAL_TABLET | Freq: Three times a day (TID) | ORAL | 0 refills | Status: AC

## 2020-07-29 NOTE — Care Management Important Message (Signed)
Important Message  Patient Details IM Letter mailed to the Patient Name: Angel French MRN: [id number] Date of Birth: 1951/10/02   Medicare Important Message Given:  Yes     Kerin Salen 07/29/2020, 11:17 AM

## 2020-07-29 NOTE — Progress Notes (Signed)
Patient was upset about Norco med being discontinued. She also stated that she takes 200 mg of Trazodone every night instead of 100 mg. Paged [facility] Lang Snow, NP) to indicate the patient's med concerns.   New orders: 1 time dose of Norco (10-325) mg (1 tab). No changes to the Trazodone dose.

## 2020-07-29 NOTE — Plan of Care (Signed)
  Problem: Education: Goal: Knowledge of General Education information will improve Description: Including pain rating scale, medication(s)/side effects and non-pharmacologic comfort measures Outcome: Progressing   Problem: Coping: Goal: Level of anxiety will decrease Outcome: Progressing   

## 2020-07-29 NOTE — TOC Transition Note (Signed)
Transition of Care [facility]) - CM/SW Discharge Note   Patient Details  Name: Angel French MRN: [id number] Date of Birth: April 21, 1951  [facility]) CM/SW Contact:  Lennart Pall, LCSW Phone Number: 07/29/2020, 9:53 AM   Clinical Narrative:   Met with pt yesterday and today to review possible dc needs.  Medically cleared for dc today and PT has recommended HHPT follow up - pt agreeable and has no agency preference.  Referral placed with [facility].  No further TOC needs.    Final next level of care: Home w Home Health Services Barriers to Discharge: Barriers Resolved   Patient Goals and CMS Choice Patient states their goals for this hospitalization and ongoing recovery are:: return home   Choice offered to / list presented to : Patient  Discharge Placement                       Discharge Plan and Services                DME Arranged: N/A DME Agency: NA       HH Arranged: PT HH Agency: [facility] Date HH Agency Contacted: 07/29/20 Time [place]: [phone] Representative spoke with at [place]: [place] ([place]) Interventions     Readmission Risk Interventions Readmission Risk Prevention Plan 07/29/2020  Transportation Screening Complete  PCP or Specialist Appt within 3-5 Days Complete  HRI or [place] Complete  Social Work Consult for [place] Planning/Counseling Complete  Palliative Care Screening Not Applicable  Medication Review Press photographer) Complete  Some recent data might be hidden

## 2020-07-29 NOTE — Progress Notes (Signed)
Subjective: Patient complains of continued nausea, was able to tolerate some of her diet yesterday. She has not had a bowel movement in over 8 days, normally has a bowel movement every 7 to 10 days. Denies abdominal pain or bloating.  Objective: Vital signs in last 24 hours: Temp:  [98.3 F (36.8 C)-98.9 F (37.2 C)] 98.3 F (36.8 C) (09/28 0545) Pulse Rate:  [87-90] 89 (09/28 0545) Resp:  [15-18] 18 (09/28 0545) BP: (136-153)/(62-81) 147/81 (09/28 0545) SpO2:  [97 %-100 %] 100 % (09/28 0545) Weight change:  Last BM Date: 07/26/20  PE: Overweight, mild pallor GENERAL: Not in distress, able to speak in full sentences ABDOMEN: Soft, nondistended, nontender, normoactive bowel sounds EXTREMITIES: No deformity  Lab Results: Results for orders placed or performed during the hospital encounter of 07/25/20 (from the past 48 hour(s))  CBC     Status: Abnormal   Collection Time: 07/27/20 11:13 AM  Result Value Ref Range   WBC 4.3 4.0 - 10.5 K/uL   RBC 3.41 (L) 3.87 - 5.11 MIL/uL   Hemoglobin 10.7 (L) 12.0 - 15.0 g/dL   HCT 32.7 (L) 36 - 46 %   MCV 95.9 80.0 - 100.0 fL   MCH 31.4 26.0 - 34.0 pg   MCHC 32.7 30.0 - 36.0 g/dL   RDW 13.8 11.5 - 15.5 %   Platelets 156 150 - 400 K/uL   nRBC 0.0 0.0 - 0.2 %    Comment: Performed at [facility], [place] [address] [place], [place] [address]  Basic metabolic panel     Status: Abnormal   Collection Time: 07/27/20 11:13 AM  Result Value Ref Range   Sodium 139 135 - 145 mmol/L   Potassium 3.5 3.5 - 5.1 mmol/L   Chloride 102 98 - 111 mmol/L   CO2 27 22 - 32 mmol/L   Glucose, Bld 146 (H) 70 - 99 mg/dL    Comment: Glucose reference range applies only to samples taken after fasting for at least 8 hours.   BUN 15 8 - 23 mg/dL   Creatinine, Ser 1.42 (H) 0.44 - 1.00 mg/dL   Calcium 8.9 8.9 - 10.3 mg/dL   GFR calc non Af Amer 38 (L) >60 mL/min   GFR calc Af Amer 44 (L) >60 mL/min   Anion gap 10 5 - 15    Comment: Performed at  [facility], [place] [address], [place], [place] [address]  Magnesium     Status: None   Collection Time: 07/27/20 11:13 AM  Result Value Ref Range   Magnesium 2.0 1.7 - 2.4 mg/dL    Comment: Performed at [facility], [place] [address] [place], [place] [address]  Phosphorus     Status: None   Collection Time: 07/27/20 11:13 AM  Result Value Ref Range   Phosphorus 3.0 2.5 - 4.6 mg/dL    Comment: Performed at [facility], [place] [address] [place], [place] [address]  Glucose, capillary     Status: Abnormal   Collection Time: 07/27/20 12:00 PM  Result Value Ref Range   Glucose-Capillary 139 (H) 70 - 99 mg/dL    Comment: Glucose reference range applies only to samples taken after fasting for at least 8 hours.  Glucose, capillary     Status: None   Collection Time: 07/27/20  4:22 PM  Result Value Ref Range   Glucose-Capillary 89 70 - 99 mg/dL    Comment: Glucose reference range applies only to samples taken  after fasting for at least 8 hours.  Glucose, capillary     Status: Abnormal   Collection Time: 07/27/20  8:59 PM  Result Value Ref Range   Glucose-Capillary 125 (H) 70 - 99 mg/dL    Comment: Glucose reference range applies only to samples taken after fasting for at least 8 hours.  Glucose, capillary     Status: Abnormal   Collection Time: 07/28/20  7:23 AM  Result Value Ref Range   Glucose-Capillary 133 (H) 70 - 99 mg/dL    Comment: Glucose reference range applies only to samples taken after fasting for at least 8 hours.  Glucose, capillary     Status: Abnormal   Collection Time: 07/28/20 11:33 AM  Result Value Ref Range   Glucose-Capillary 155 (H) 70 - 99 mg/dL    Comment: Glucose reference range applies only to samples taken after fasting for at least 8 hours.  Glucose, capillary     Status: Abnormal   Collection Time: 07/28/20  5:01 PM  Result Value Ref Range   Glucose-Capillary 141 (H) 70 - 99 mg/dL    Comment: Glucose  reference range applies only to samples taken after fasting for at least 8 hours.  Glucose, capillary     Status: Abnormal   Collection Time: 07/28/20  9:27 PM  Result Value Ref Range   Glucose-Capillary 112 (H) 70 - 99 mg/dL    Comment: Glucose reference range applies only to samples taken after fasting for at least 8 hours.  Glucose, capillary     Status: Abnormal   Collection Time: 07/29/20  7:24 AM  Result Value Ref Range   Glucose-Capillary 120 (H) 70 - 99 mg/dL    Comment: Glucose reference range applies only to samples taken after fasting for at least 8 hours.    Studies/Results: MR KNEE LEFT WO CONTRAST  Result Date: 07/28/2020 CLINICAL DATA:  Chronic left knee pain, fall 1 month ago EXAM: MRI OF THE LEFT KNEE WITHOUT CONTRAST TECHNIQUE: Multiplanar, multisequence MR imaging of the knee was performed. No intravenous contrast was administered. COMPARISON:  X-ray 06/29/2020 FINDINGS: MENISCI Medial meniscus: Intermediate intrasubstance signal within the posterior horn and body segments of the medial meniscus. No discrete fluid intensity component extending to an articular surface to suggest tear. Lateral meniscus: Subtle oblique tear of the lateral meniscal anterior horn extending to the superior articular surface (series 13, image 15). LIGAMENTS Cruciates: Mucoid degeneration cruciate ligaments without evidence of tear. Collaterals: Medial collateral ligament is intact. Lateral collateral ligament complex is intact. CARTILAGE Patellofemoral: High-grade cartilage loss involving the medial trochlea and central trochlear groove. Mild patellar chondral thinning. Medial:  No chondral defect. Lateral:  No chondral defect. Joint:  Trace joint effusion.  Fat pads unremarkable. Popliteal Fossa:  Trace Baker's cyst.  Intact popliteus tendon. Extensor Mechanism:  Intact quadriceps tendon and patellar tendon. Bones: Reactive subchondral marrow signal changes within the trochlea. No acute fracture. No  malalignment. No suspicious bone lesion. Other: None. IMPRESSION: 1. Subtle oblique tear of the lateral meniscal anterior horn. 2. High-grade cartilage loss involving the medial trochlea and central trochlear groove. 3. Medial meniscal degeneration without discrete tear. 4. Mucoid degeneration cruciate ligaments. 5. Trace joint effusion and trace Baker's cyst. Electronically Signed   By: Davina Poke D.O.   On: 07/28/2020 11:30    Medications: I have reviewed the patient's current medications.  Assessment: Nausea, bloating, early satiety, upper abdominal discomfort Unrevealing EGD and imaging study Possible gastroparesis Normocytic anemia Impaired renal function, BUN 15, creatinine  1.42, GFR 38 yesterday  Plan: I had initially ordered for a gastric emptying scan, however as per radiology department because she is on narcotics and Reglan the results will not be accurate. She needs to be without narcotics and prokinetic medication at least for 2 days. Discussed the same with the patient. We may be able to plan this as an outpatient. For now we will treat her with presumed gastroparesis which could be related to diabetes and worsened with use of narcotics. Discussed about side effects of Reglan including tremors and tardive dyskinesia and discontinued Reglan, patient verbalized understanding and consents. We will start patient on erythromycin to 50 mg 3 times daily as a prokinetic agent. Discussed with patient to take low fiber, low residue diet, small frequent meals and to have good blood sugar control. Okay to DC from GI standpoint. Patient to follow-up with me in the office in the next 3 to 4 weeks.  Ronnette Juniper, MD 07/29/2020, 8:33 AM

## 2020-07-30 ENCOUNTER — Encounter (HOSPITAL_COMMUNITY): Payer: Self-pay | Admitting: *Deleted

## 2020-07-30 ENCOUNTER — Other Ambulatory Visit: Payer: Self-pay

## 2020-07-30 ENCOUNTER — Ambulatory Visit (HOSPITAL_BASED_OUTPATIENT_CLINIC_OR_DEPARTMENT_OTHER): Payer: Medicare Other

## 2020-07-30 ENCOUNTER — Ambulatory Visit (HOSPITAL_COMMUNITY): Payer: Medicare Other | Attending: Internal Medicine

## 2020-07-30 DIAGNOSIS — R079 Chest pain, unspecified: Secondary | ICD-10-CM

## 2020-07-30 LAB — ECHOCARDIOGRAM COMPLETE
Area-P 1/2: 4.35 cm2
S' Lateral: 1.7 cm

## 2020-07-30 MED ORDER — TECHNETIUM TC 99M TETROFOSMIN IV KIT
27.3000 | PACK | Freq: Once | INTRAVENOUS | Status: AC | PRN
  Administered 2020-07-30: 27.3 via INTRAVENOUS
  Filled 2020-07-30: qty 28

## 2020-07-31 DIAGNOSIS — Z7952 Long term (current) use of systemic steroids: Secondary | ICD-10-CM | POA: Diagnosis not present

## 2020-07-31 DIAGNOSIS — R1310 Dysphagia, unspecified: Secondary | ICD-10-CM | POA: Diagnosis not present

## 2020-07-31 DIAGNOSIS — M059 Rheumatoid arthritis with rheumatoid factor, unspecified: Secondary | ICD-10-CM | POA: Diagnosis not present

## 2020-07-31 DIAGNOSIS — E1122 Type 2 diabetes mellitus with diabetic chronic kidney disease: Secondary | ICD-10-CM | POA: Diagnosis not present

## 2020-07-31 DIAGNOSIS — N1832 Chronic kidney disease, stage 3b: Secondary | ICD-10-CM | POA: Diagnosis not present

## 2020-07-31 DIAGNOSIS — R109 Unspecified abdominal pain: Secondary | ICD-10-CM | POA: Diagnosis not present

## 2020-07-31 DIAGNOSIS — R630 Anorexia: Secondary | ICD-10-CM | POA: Diagnosis not present

## 2020-07-31 DIAGNOSIS — Z794 Long term (current) use of insulin: Secondary | ICD-10-CM | POA: Diagnosis not present

## 2020-07-31 DIAGNOSIS — M1611 Unilateral primary osteoarthritis, right hip: Secondary | ICD-10-CM | POA: Diagnosis not present

## 2020-07-31 DIAGNOSIS — I129 Hypertensive chronic kidney disease with stage 1 through stage 4 chronic kidney disease, or unspecified chronic kidney disease: Secondary | ICD-10-CM | POA: Diagnosis not present

## 2020-07-31 DIAGNOSIS — R634 Abnormal weight loss: Secondary | ICD-10-CM | POA: Diagnosis not present

## 2020-07-31 DIAGNOSIS — M5136 Other intervertebral disc degeneration, lumbar region: Secondary | ICD-10-CM | POA: Diagnosis not present

## 2020-07-31 DIAGNOSIS — G4733 Obstructive sleep apnea (adult) (pediatric): Secondary | ICD-10-CM | POA: Diagnosis not present

## 2020-07-31 DIAGNOSIS — E11319 Type 2 diabetes mellitus with unspecified diabetic retinopathy without macular edema: Secondary | ICD-10-CM | POA: Diagnosis not present

## 2020-07-31 DIAGNOSIS — E1140 Type 2 diabetes mellitus with diabetic neuropathy, unspecified: Secondary | ICD-10-CM | POA: Diagnosis not present

## 2020-07-31 DIAGNOSIS — K760 Fatty (change of) liver, not elsewhere classified: Secondary | ICD-10-CM | POA: Diagnosis not present

## 2020-07-31 DIAGNOSIS — G8929 Other chronic pain: Secondary | ICD-10-CM | POA: Diagnosis not present

## 2020-07-31 DIAGNOSIS — M797 Fibromyalgia: Secondary | ICD-10-CM | POA: Diagnosis not present

## 2020-08-05 ENCOUNTER — Other Ambulatory Visit: Payer: Self-pay

## 2020-08-05 ENCOUNTER — Ambulatory Visit (HOSPITAL_COMMUNITY): Payer: Medicare Other | Attending: Cardiology

## 2020-08-05 DIAGNOSIS — R079 Chest pain, unspecified: Secondary | ICD-10-CM | POA: Diagnosis not present

## 2020-08-05 LAB — MYOCARDIAL PERFUSION IMAGING
LV dias vol: 52 mL (ref 46–106)
LV sys vol: 25 mL
Peak HR: 112 {beats}/min
Rest HR: 94 {beats}/min
SDS: 1
SRS: 0
SSS: 1
TID: 1.39

## 2020-08-05 MED ORDER — TECHNETIUM TC 99M TETROFOSMIN IV KIT
32.9000 | PACK | Freq: Once | INTRAVENOUS | Status: AC | PRN
  Administered 2020-08-05: 32.9 via INTRAVENOUS
  Filled 2020-08-05: qty 33

## 2020-08-05 MED ORDER — REGADENOSON 0.4 MG/5ML IV SOLN
0.4000 mg | Freq: Once | INTRAVENOUS | Status: AC
  Administered 2020-08-05: 0.4 mg via INTRAVENOUS

## 2020-08-05 NOTE — Discharge Summary (Signed)
Physician Discharge Summary  Angel French WSF:[id number] DOB: 18-Jan-1951 DOA: 07/25/2020  PCP: Wardell Honour, MD  Admit date: 07/25/2020 Discharge date: 07/29/2020  Admitted From: Home Disposition:  Home.   Recommendations for Outpatient Follow-up:  1. Follow up with PCP in 1-2 weeks 2. Please obtain BMP/CBC in one week Please follow up with GI as recommended Please follow up with orthopedics as recommended.  Recommend outpatient follow up with ob gyn for evaluation of the left cyst.   Discharge Condition: stable.  CODE STATUS: full code.  Diet recommendation: Heart Healthy    Brief/Interim Summary: 69 year old lady prior history of rheumatoid arthritis, stage IIIa CKD, chronic pain syndrome, diabetes mellitus, GERD, hyperlipidemia, peripheral neuropathy, history of PUD, depression presents with loss of appetite, dysphagia, unintentional weight loss and abdominal pain. EDG done on 07/26/20 showed patchy mild inflammation in the stomach.  Biopsy results for H. pylori are still pending.  Meanwhile patient reports that she has left knee pain since 1 month since the fall.  MRI of the knee shows possible lateral meniscal tear of the left knee.  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  She is scheduled for intra-articular steroid injection to help with inflammation.  Weightbearing as tolerated and PT evaluation and knee brace.  Discharge Diagnoses:  Active Problems:   Abdominal pain   Dysphagia  Dysphagia, anorexia, unintentional weight loss, abdominal pain Unclear etiology so far. Differentiate include gastroparesis vs gastritis.  CT of the abdomen and pelvis without contrast showed Cystic area in the LEFT adnexa not significantly changed from previous imaging. Further evaluation by gastroenterology.  EGD done  showed mild  patchy inflammation in the stomach.  Biopsy results for evaluation of H. pylori which is negative.  Continue with PPI on discharge.  GI recommends erythromycin on discharge and outpatient follow up with gastric emptying study.       Essential hypertension Blood pressure parameters are well controlled  Diabetes mellitus Resume home meds on discharge.    Depression Continue with Cymbalta and Lexapro.   Mild anemia of chronic disease Hemoglobin stable around 11.    Stage IIIa CKD Creatinine appears to be at baseline around 1.4    Hypokalemia and hypomagnesemia Replaced.   Left knee pain:  Reported from a fall 4 weeks ago, negative x rays, pt unable to ambulate due to severe pain.  PT evaluation recommending home health PT.  Ortho consulted and recommendations given to provide splint.  WBAT, up with PT, continue PT with focus on quad strengthening exercises - per MRI quad and patellar tendons intact without tear - patient requesting knee brace,   Anemia of chronic disease  Baseline hemoglobin of 11, hemoglobin is around 10.     Hyperlipidemia:  Continue with crestor.     Discharge Instructions  Discharge Instructions    Diet - low sodium heart healthy   Complete by: As directed    Discharge instructions   Complete by: As directed    Please follow up with gastroenterology in 3 to 4 weeks.     Allergies as of 07/29/2020      Reactions   Codeine Anaphylaxis   Contrast Media [iodinated Diagnostic Agents] Anaphylaxis   Nitrofurantoin Monohyd Macro Anaphylaxis   Betadine [povidone Iodine] Itching   Folic Acid Itching   Gabapentin Other (See Comments)   Makes patient feel drunk   Iodine Hives  Lyrica [pregabalin] Other (See Comments)   Makes patient feel drunk   Red Dye Itching   Ultram [tramadol Hcl] Nausea And Vomiting      Medication List    STOP taking these medications   HYDROcodone-acetaminophen 10-325 MG  tablet Commonly known as: NORCO     TAKE these medications   acetaminophen 325 MG tablet Commonly known as: TYLENOL Take 650 mg by mouth every 6 (six) hours as needed (every 6 weeks before RA infusion).   allopurinol 100 MG tablet Commonly known as: ZYLOPRIM Take 1 tablet (100 mg total) by mouth daily. Ov needed   amLODipine 5 MG tablet Commonly known as: NORVASC Take 1 tablet (5 mg total) by mouth daily.   aspirin EC 81 MG tablet Take 1 tablet (81 mg total) by mouth daily. Swallow whole.   blood glucose meter kit and supplies Kit Dispense based on patient and insurance preference. Use up to four times daily as directed. (FOR ICD-9 250.00, 250.01).   diclofenac sodium 1 % Gel Commonly known as: VOLTAREN Apply 2 g topically 4 (four) times daily. What changed:   when to take this  reasons to take this   diphenhydrAMINE 25 MG tablet Commonly known as: BENADRYL Take 25 mg by mouth every 6 (six) hours as needed (every 6 weeks prior to RA infusion).   DULoxetine 30 MG capsule Commonly known as: CYMBALTA TAKE 1 CAPSULE BY MOUTH EVERY DAY What changed: how much to take   erythromycin 250 MG tablet Commonly known as: E-MYCIN Take 1 tablet (250 mg total) by mouth every 8 (eight) hours for 14 days.   escitalopram 20 MG tablet Commonly known as: LEXAPRO TAKE 1 TABLET BY MOUTH EVERY DAY What changed: how much to take   famotidine 40 MG tablet Commonly known as: PEPCID Take 40 mg by mouth at bedtime.   feeding supplement (ENSURE ENLIVE) Liqd Take 237 mLs by mouth 3 (three) times daily between meals.   furosemide 20 MG tablet Commonly known as: LASIX Take 1-3 tablets (20-60 mg total) by mouth daily.   glucose blood test strip Check sugar three times daily  Dx: DMII insulin dependent with retinopathy, neuropathy controlled   insulin aspart 100 UNIT/ML injection Commonly known as: NovoLOG Inject 25 Units into the skin 3 (three) times daily with meals. What changed:  how much to take   Insulin Syringes (Disposable) U-100 0.5 ML Misc 28 Units by Does not apply route 2 (two) times daily.   leflunomide 20 MG tablet Commonly known as: ARAVA Take 20 mg by mouth daily.   Levemir 100 UNIT/ML injection Generic drug: insulin detemir INJECT 60 UNITS AT BEDTIME AS DIRECTED What changed: See the new instructions.   Linzess 290 MCG Caps capsule Generic drug: linaclotide Take 290 mcg by mouth daily before breakfast.   Needles & Syringes Misc 1 Syringe by Does not apply route 2 (two) times daily.   omeprazole 20 MG capsule Commonly known as: PRILOSEC TAKE 1 CAPSULE BY MOUTH EVERY DAY What changed: how much to take   oxybutynin 15 MG 24 hr tablet Commonly known as: DITROPAN XL TAKE 1 TABLET BY MOUTH AT BEDTIME   predniSONE 5 MG tablet Commonly known as: DELTASONE Take 5 mg by mouth daily as needed. Only takes with RA Flare.   rosuvastatin 10 MG tablet Commonly known as: CRESTOR Take 1 tablet (10 mg total) by mouth daily.   traZODone 100 MG tablet Commonly known as: DESYREL TAKE 2 TABLETS BY MOUTH EVERY DAY  AT BEDTIME What changed:   how much to take  how to take this  when to take this  additional instructions   UltiCare Insulin Syringe 31G X 5/16" 0.5 ML Misc Generic drug: Insulin Syringe-Needle U-100 USE AS DIRECTED TO INJECT INSULIN 2 TIMES DAILY       Follow-up Information    Wardell Honour, MD. Schedule an appointment as soon as possible for a visit in 1 week(s).   Specialty: Family Medicine Contact information: [place] [address] [phone]        Marchia Bond, MD. Schedule an appointment as soon as possible for a visit in 2 week(s).   Specialty: Orthopedic Surgery Contact information: [address] [place] [address] [phone]        Ronnette Juniper, MD. Schedule an appointment as soon as possible for a visit in 1 month(s).   Specialty: Gastroenterology Contact  information: [place] [address] [phone]        [facility] Follow up.   Specialty: Home Health Services Why: to provide home health physical therapy Contact information: [place] [address] [phone]              Allergies  Allergen Reactions  . Codeine Anaphylaxis  . Contrast Media [Iodinated Diagnostic Agents] Anaphylaxis  . Nitrofurantoin Monohyd Macro Anaphylaxis  . Betadine [Povidone Iodine] Itching  . Folic Acid Itching  . Gabapentin Other (See Comments)    Makes patient feel drunk  . Iodine Hives  . Lyrica [Pregabalin] Other (See Comments)    Makes patient feel drunk  . Red Dye Itching  . Ultram [Tramadol Hcl] Nausea And Vomiting    Consultations:  Gastroenterology.   Procedures/Studies: CT Abdomen Pelvis Wo Contrast  Result Date: 07/25/2020 CLINICAL DATA:  Acute abdominal pain, nonlocalized EXAM: CT ABDOMEN AND PELVIS WITHOUT CONTRAST TECHNIQUE: Multidetector CT imaging of the abdomen and pelvis was performed following the standard protocol without IV contrast. COMPARISON:  02/21/2020 FINDINGS: Lower chest: Incidental imaging of the lung bases without consolidation or pleural effusion. Hepatobiliary: No focal, suspicious hepatic abnormality. Post cholecystectomy. No biliary duct dilation. Pancreas: Pancreas without inflammation or contour abnormality. Spleen: Spleen normal in size and contour. Adrenals/Urinary Tract: Adrenal glands are normal. Kidneys without hydronephrosis. Mild cortical scarring. No nephrolithiasis. Stomach/Bowel: Under distension of the colon in the ascending through the transverse colon limiting assessment. Appendix is normal. Stomach under distended limiting assessment. Small bowel normal in course and caliber without signs of adjacent inflammation. Vascular/Lymphatic: Atherosclerotic calcification of the abdominal aorta. There is no gastrohepatic or hepatoduodenal ligament  lymphadenopathy. No retroperitoneal or mesenteric lymphadenopathy. No pelvic sidewall lymphadenopathy. Reproductive: Post hysterectomy. Cystic area in the LEFT adnexa. Gas in the vagina. LEFT adnexal cystic area favored to be related to the ovary based on relationship of vessels measuring 3.3 x 2.1 cm, slightly diminished in size compared to the prior exam. Other: No ascites.  No free air. Musculoskeletal: No acute musculoskeletal process or destructive bone finding. Spinal degenerative changes similar to the prior study. IMPRESSION: 1. Cystic area in the LEFT adnexa not significantly changed from previous imaging. Based on previous ultrasound of the pelvis and current ACR recommendations would suggest nonemergent ultrasound follow-up for further evaluation. 2. Small amount of gas adjacent the vaginal apex without significant surrounding stranding or change since the prior study may represent redundant vaginal cuff. Correlate with any signs of vaginal drainage or pelvic pain. 3. Under distension  of the colon, question of mild hypervascularity could be seen in the setting of mild colitis. Correlate with symptoms and history. 4. Post cholecystectomy with normal appendix. Electronically Signed   By: Zetta Bills M.D.   On: 07/25/2020 10:25   MR KNEE LEFT WO CONTRAST  Result Date: 07/28/2020 CLINICAL DATA:  Chronic left knee pain, fall 1 month ago EXAM: MRI OF THE LEFT KNEE WITHOUT CONTRAST TECHNIQUE: Multiplanar, multisequence MR imaging of the knee was performed. No intravenous contrast was administered. COMPARISON:  X-ray 06/29/2020 FINDINGS: MENISCI Medial meniscus: Intermediate intrasubstance signal within the posterior horn and body segments of the medial meniscus. No discrete fluid intensity component extending to an articular surface to suggest tear. Lateral meniscus: Subtle oblique tear of the lateral meniscal anterior horn extending to the superior articular surface (series 13, image 15). LIGAMENTS  Cruciates: Mucoid degeneration cruciate ligaments without evidence of tear. Collaterals: Medial collateral ligament is intact. Lateral collateral ligament complex is intact. CARTILAGE Patellofemoral: High-grade cartilage loss involving the medial trochlea and central trochlear groove. Mild patellar chondral thinning. Medial:  No chondral defect. Lateral:  No chondral defect. Joint:  Trace joint effusion.  Fat pads unremarkable. Popliteal Fossa:  Trace Baker's cyst.  Intact popliteus tendon. Extensor Mechanism:  Intact quadriceps tendon and patellar tendon. Bones: Reactive subchondral marrow signal changes within the trochlea. No acute fracture. No malalignment. No suspicious bone lesion. Other: None. IMPRESSION: 1. Subtle oblique tear of the lateral meniscal anterior horn. 2. High-grade cartilage loss involving the medial trochlea and central trochlear groove. 3. Medial meniscal degeneration without discrete tear. 4. Mucoid degeneration cruciate ligaments. 5. Trace joint effusion and trace Baker's cyst. Electronically Signed   By: Davina Poke D.O.   On: 07/28/2020 11:30   DG Chest Port 1 View  Result Date: 07/25/2020 CLINICAL DATA:  Epigastric pain EXAM: PORTABLE CHEST 1 VIEW COMPARISON:  06/23/2020 FINDINGS: The heart size and mediastinal contours are within normal limits. No focal airspace consolidation, pleural effusion, or pneumothorax. The visualized skeletal structures are unremarkable. IMPRESSION: No active disease. Electronically Signed   By: Davina Poke D.O.   On: 07/25/2020 09:35   MYOCARDIAL PERFUSION IMAGING  Result Date: 08/05/2020  The left ventricular ejection fraction is mildly decreased (45-54%).  Nuclear stress EF: 52%.  There was no ST segment deviation noted during stress.  Defect 1: There is a small defect of moderate severity present in the apex location.  Low risk, probably normal stress nuclear study with apical thinning but no ischemia.  Gated ejection fraction 52% with  normal wall motion.   ECHOCARDIOGRAM COMPLETE  Result Date: 07/30/2020    ECHOCARDIOGRAM REPORT   Patient Name:   SRUTHI MAURER Date of Exam: 07/30/2020 Medical Rec #:  [id number]       Height:       62.0 in Accession #:    [id number]      Weight:       162.0 lb Date of Birth:  1951/03/04       BSA:          1.748 m Patient Age:    39 years        BP:           132/72 mmHg Patient Gender: F               HR:           102 bpm. Exam Location:  [place] Procedure: 2D Echo, 3D Echo, Cardiac Doppler and Color Doppler Indications:  R07.9 Chest Pain  History:        Patient has prior history of Echocardiogram examinations, most                 recent 02/13/2018. TIA, Signs/Symptoms:Chest Pain; Risk                 Factors:Hypertension, Diabetes, Dyslipidemia, Former Smoker and                 Sleep Apnea. Edema, Chronic Kidney Disease, Obestiy.  Sonographer:    Deliah Boston RDCS Referring Phys: [id number] [place]  1. Left ventricular ejection fraction, by estimation, is 65 to 70%. The left ventricle has hyperdynamic function. The left ventricle has no regional wall motion abnormalities. Left ventricular diastolic parameters are consistent with Grade I diastolic dysfunction (impaired relaxation).  2. Right ventricular systolic function is normal. The right ventricular size is normal. Tricuspid regurgitation signal is inadequate for assessing PA pressure.  3. The mitral valve is normal in structure. No evidence of mitral valve regurgitation. No evidence of mitral stenosis.  4. The aortic valve is tricuspid. Aortic valve regurgitation is not visualized. No aortic stenosis is present.  5. The inferior vena cava is normal in size with greater than 50% respiratory variability, suggesting right atrial pressure of 3 mmHg.  6. Increased flow velocities may be secondary to anemia, thyrotoxicosis, hyperdynamic or high flow state. FINDINGS  Left Ventricle: Left ventricular ejection fraction, by  estimation, is 65 to 70%. The left ventricle has hyperdynamic function. The left ventricle has no regional wall motion abnormalities. The left ventricular internal cavity size was normal in size. There is no left ventricular hypertrophy. Left ventricular diastolic parameters are consistent with Grade I diastolic dysfunction (impaired relaxation). Right Ventricle: The right ventricular size is normal. No increase in right ventricular wall thickness. Right ventricular systolic function is normal. Tricuspid regurgitation signal is inadequate for assessing PA pressure. Left Atrium: Left atrial size was normal in size. Right Atrium: Right atrial size was normal in size. Pericardium: There is no evidence of pericardial effusion. Mitral Valve: The mitral valve is normal in structure. Mild mitral annular calcification. No evidence of mitral valve regurgitation. No evidence of mitral valve stenosis. Tricuspid Valve: The tricuspid valve is normal in structure. Tricuspid valve regurgitation is not demonstrated. No evidence of tricuspid stenosis. Aortic Valve: The aortic valve is tricuspid. Aortic valve regurgitation is not visualized. No aortic stenosis is present. Pulmonic Valve: The pulmonic valve was normal in structure. Pulmonic valve regurgitation is not visualized. No evidence of pulmonic stenosis. Aorta: The aortic root is normal in size and structure. Venous: The inferior vena cava is normal in size with greater than 50% respiratory variability, suggesting right atrial pressure of 3 mmHg. IAS/Shunts: No atrial level shunt detected by color flow Doppler.  LEFT VENTRICLE PLAX 2D LVIDd:         2.90 cm  Diastology LVIDs:         1.70 cm  LV e' medial:    5.33 cm/s LV PW:         0.80 cm  LV E/e' medial:  12.5 LV IVS:        0.90 cm  LV e' lateral:   7.83 cm/s LVOT diam:     2.00 cm  LV E/e' lateral: 8.5 LV SV:         76 LV SV Index:   43 LVOT Area:     3.14 cm  RIGHT VENTRICLE RV S  prime:     12.30 cm/s TAPSE (M-mode):  2.0 cm LEFT ATRIUM             Index       RIGHT ATRIUM           Index LA diam:        3.50 cm 2.00 cm/m  RA Area:     11.10 cm LA Vol (A2C):   39.5 ml 22.60 ml/m RA Volume:   24.50 ml  14.02 ml/m LA Vol (A4C):   32.5 ml 18.59 ml/m LA Biplane Vol: 35.9 ml 20.54 ml/m  AORTIC VALVE LVOT Vmax:   127.50 cm/s LVOT Vmean:  81.350 cm/s LVOT VTI:    0.241 m  AORTA Ao Root diam: 3.10 cm Ao Asc diam:  3.30 cm MITRAL VALVE MV Area (PHT): cm          SHUNTS MV Decel Time: 175 msec     Systemic VTI:  0.24 m MV E velocity: 66.70 cm/s   Systemic Diam: 2.00 cm MV A velocity: 145.00 cm/s MV E/A ratio:  0.46 Cherlynn Kaiser MD Electronically signed by Cherlynn Kaiser MD Signature Date/Time: 07/30/2020/2:49:03 PM    Final    DG ESOPHAGUS W SINGLE CM (SOL OR THIN BA)  Result Date: 07/25/2020 CLINICAL DATA:  Dysphagia.  Nausea after eating/drinking. EXAM: ESOPHOGRAM/BARIUM SWALLOW TECHNIQUE: Single contrast examination was performed using  thin barium. FLUOROSCOPY TIME:  Fluoroscopy Time:  2 minutes and 36 seconds. Radiation Exposure Index (if provided by the fluoroscopic device): 86 mGy Number of Acquired Spot Images: COMPARISON:  None. FINDINGS: Limited study due to patient immobility. Patient was placed on the fluoro table which was moved to a 45-60 degree head up position with assessment performed with the patient in both oblique positions. Patient was given sips of thin barium by mouth and monitored fluoroscopically while swallowing. She did have some discomfort on the initial swallowing phase with barium, but not so much with water. No evidence for gross esophageal mass lesion, diverticulum, or gross mucosal ulceration. No stricture evident. Patient was given a 13 mm barium tablet. This passed into the stomach after repeated swallows of water. IMPRESSION: No gross esophageal abnormality identified on this limited single contrast barium esophagram. Electronically Signed   By: Misty Stanley M.D.   On: 07/25/2020 13:20        Subjective:  No new complaints.  Discharge Exam: Vitals:   07/28/20 2126 07/29/20 0545  BP: (!) 153/78 (!) 147/81  Pulse: 87 89  Resp: 16 18  Temp: 98.9 F (37.2 C) 98.3 F (36.8 C)  SpO2: 97% 100%   Vitals:   07/28/20 0527 07/28/20 1339 07/28/20 2126 07/29/20 0545  BP: (!) 153/76 136/62 (!) 153/78 (!) 147/81  Pulse: 90 90 87 89  Resp: $Remo'15 15 16 18  'tlkLk$ Temp: 98.2 F (36.8 C) 98.6 F (37 C) 98.9 F (37.2 C) 98.3 F (36.8 C)  TempSrc: Oral Oral Oral Oral  SpO2: 98% 99% 97% 100%  Weight:      Height:        General: Pt is alert, awake, not in acute distress Cardiovascular: RRR, S1/S2 +, no rubs, no gallops Respiratory: CTA bilaterally, no wheezing, no rhonchi Abdominal: Soft, NT, ND, bowel sounds + Extremities: no edema, no cyanosis    The results of significant diagnostics from this hospitalization (including imaging, microbiology, ancillary and laboratory) are listed below for reference.     Microbiology: No results found for this or any previous visit (from the  past 240 hour(s)).   Labs: BNP (last 3 results) No results for input(s): BNP in the last 8760 hours. Basic Metabolic Panel: No results for input(s): NA, K, CL, CO2, GLUCOSE, BUN, CREATININE, CALCIUM, MG, PHOS in the last 168 hours. Liver Function Tests: No results for input(s): AST, ALT, ALKPHOS, BILITOT, PROT, ALBUMIN in the last 168 hours. No results for input(s): LIPASE, AMYLASE in the last 168 hours. No results for input(s): AMMONIA in the last 168 hours. CBC: No results for input(s): WBC, NEUTROABS, HGB, HCT, MCV, PLT in the last 168 hours. Cardiac Enzymes: No results for input(s): CKTOTAL, CKMB, CKMBINDEX, TROPONINI in the last 168 hours. BNP: Invalid input(s): POCBNP CBG: No results for input(s): GLUCAP in the last 168 hours. D-Dimer No results for input(s): DDIMER in the last 72 hours. Hgb A1c No results for input(s): HGBA1C in the last 72 hours. Lipid Profile No results for  input(s): CHOL, HDL, LDLCALC, TRIG, CHOLHDL, LDLDIRECT in the last 72 hours. Thyroid function studies No results for input(s): TSH, T4TOTAL, T3FREE, THYROIDAB in the last 72 hours.  Invalid input(s): FREET3 Anemia work up No results for input(s): VITAMINB12, FOLATE, FERRITIN, TIBC, IRON, RETICCTPCT in the last 72 hours. Urinalysis    Component Value Date/Time   COLORURINE YELLOW 07/25/2020 2043   APPEARANCEUR HAZY (A) 07/25/2020 2043   APPEARANCEUR Cloudy (A) 06/28/2017 1803   LABSPEC 1.016 07/25/2020 2043   PHURINE 6.0 07/25/2020 2043   GLUCOSEU NEGATIVE 07/25/2020 2043   HGBUR SMALL (A) 07/25/2020 2043   BILIRUBINUR NEGATIVE 07/25/2020 2043   BILIRUBINUR negative 04/12/2018 1251   BILIRUBINUR Negative 06/28/2017 1803   KETONESUR 20 (A) 07/25/2020 2043   PROTEINUR NEGATIVE 07/25/2020 2043   UROBILINOGEN 0.2 04/12/2018 1251   NITRITE NEGATIVE 07/25/2020 2043   LEUKOCYTESUR MODERATE (A) 07/25/2020 2043   Sepsis Labs Invalid input(s): PROCALCITONIN,  WBC,  LACTICIDVEN Microbiology No results found for this or any previous visit (from the past 240 hour(s)).   Time coordinating discharge: 32 minutes.   SIGNED:   Hosie Poisson, MD  [facility]

## 2020-08-06 DIAGNOSIS — R109 Unspecified abdominal pain: Secondary | ICD-10-CM | POA: Diagnosis not present

## 2020-08-06 DIAGNOSIS — R630 Anorexia: Secondary | ICD-10-CM | POA: Diagnosis not present

## 2020-08-06 DIAGNOSIS — I129 Hypertensive chronic kidney disease with stage 1 through stage 4 chronic kidney disease, or unspecified chronic kidney disease: Secondary | ICD-10-CM | POA: Diagnosis not present

## 2020-08-06 DIAGNOSIS — M059 Rheumatoid arthritis with rheumatoid factor, unspecified: Secondary | ICD-10-CM | POA: Diagnosis not present

## 2020-08-06 DIAGNOSIS — R1310 Dysphagia, unspecified: Secondary | ICD-10-CM | POA: Diagnosis not present

## 2020-08-06 DIAGNOSIS — R634 Abnormal weight loss: Secondary | ICD-10-CM | POA: Diagnosis not present

## 2020-08-07 DIAGNOSIS — R634 Abnormal weight loss: Secondary | ICD-10-CM | POA: Diagnosis not present

## 2020-08-07 DIAGNOSIS — K3184 Gastroparesis: Secondary | ICD-10-CM | POA: Diagnosis not present

## 2020-08-07 DIAGNOSIS — M25562 Pain in left knee: Secondary | ICD-10-CM | POA: Diagnosis not present

## 2020-08-07 DIAGNOSIS — E1143 Type 2 diabetes mellitus with diabetic autonomic (poly)neuropathy: Secondary | ICD-10-CM | POA: Diagnosis not present

## 2020-08-07 DIAGNOSIS — Z794 Long term (current) use of insulin: Secondary | ICD-10-CM | POA: Diagnosis not present

## 2020-08-07 DIAGNOSIS — N1832 Chronic kidney disease, stage 3b: Secondary | ICD-10-CM | POA: Diagnosis not present

## 2020-08-07 DIAGNOSIS — G894 Chronic pain syndrome: Secondary | ICD-10-CM | POA: Diagnosis not present

## 2020-08-07 DIAGNOSIS — N83202 Unspecified ovarian cyst, left side: Secondary | ICD-10-CM | POA: Diagnosis not present

## 2020-08-07 DIAGNOSIS — R1084 Generalized abdominal pain: Secondary | ICD-10-CM | POA: Diagnosis not present

## 2020-08-07 DIAGNOSIS — Z23 Encounter for immunization: Secondary | ICD-10-CM | POA: Diagnosis not present

## 2020-08-07 DIAGNOSIS — E1122 Type 2 diabetes mellitus with diabetic chronic kidney disease: Secondary | ICD-10-CM | POA: Diagnosis not present

## 2020-08-13 DIAGNOSIS — R1310 Dysphagia, unspecified: Secondary | ICD-10-CM | POA: Diagnosis not present

## 2020-08-13 DIAGNOSIS — M2242 Chondromalacia patellae, left knee: Secondary | ICD-10-CM | POA: Diagnosis not present

## 2020-08-13 DIAGNOSIS — R109 Unspecified abdominal pain: Secondary | ICD-10-CM | POA: Diagnosis not present

## 2020-08-13 DIAGNOSIS — R630 Anorexia: Secondary | ICD-10-CM | POA: Diagnosis not present

## 2020-08-13 DIAGNOSIS — M059 Rheumatoid arthritis with rheumatoid factor, unspecified: Secondary | ICD-10-CM | POA: Diagnosis not present

## 2020-08-13 DIAGNOSIS — I129 Hypertensive chronic kidney disease with stage 1 through stage 4 chronic kidney disease, or unspecified chronic kidney disease: Secondary | ICD-10-CM | POA: Diagnosis not present

## 2020-08-13 DIAGNOSIS — R634 Abnormal weight loss: Secondary | ICD-10-CM | POA: Diagnosis not present

## 2020-08-19 DIAGNOSIS — R109 Unspecified abdominal pain: Secondary | ICD-10-CM | POA: Diagnosis not present

## 2020-08-19 DIAGNOSIS — R634 Abnormal weight loss: Secondary | ICD-10-CM | POA: Diagnosis not present

## 2020-08-19 DIAGNOSIS — R1310 Dysphagia, unspecified: Secondary | ICD-10-CM | POA: Diagnosis not present

## 2020-08-19 DIAGNOSIS — I129 Hypertensive chronic kidney disease with stage 1 through stage 4 chronic kidney disease, or unspecified chronic kidney disease: Secondary | ICD-10-CM | POA: Diagnosis not present

## 2020-08-19 DIAGNOSIS — R630 Anorexia: Secondary | ICD-10-CM | POA: Diagnosis not present

## 2020-08-19 DIAGNOSIS — M059 Rheumatoid arthritis with rheumatoid factor, unspecified: Secondary | ICD-10-CM | POA: Diagnosis not present

## 2020-08-20 DIAGNOSIS — M6281 Muscle weakness (generalized): Secondary | ICD-10-CM | POA: Diagnosis not present

## 2020-08-20 DIAGNOSIS — M25662 Stiffness of left knee, not elsewhere classified: Secondary | ICD-10-CM | POA: Diagnosis not present

## 2020-08-20 DIAGNOSIS — R262 Difficulty in walking, not elsewhere classified: Secondary | ICD-10-CM | POA: Diagnosis not present

## 2020-08-20 DIAGNOSIS — M25562 Pain in left knee: Secondary | ICD-10-CM | POA: Diagnosis not present

## 2020-08-27 DIAGNOSIS — R262 Difficulty in walking, not elsewhere classified: Secondary | ICD-10-CM | POA: Diagnosis not present

## 2020-08-27 DIAGNOSIS — M6281 Muscle weakness (generalized): Secondary | ICD-10-CM | POA: Diagnosis not present

## 2020-08-27 DIAGNOSIS — M25662 Stiffness of left knee, not elsewhere classified: Secondary | ICD-10-CM | POA: Diagnosis not present

## 2020-08-27 DIAGNOSIS — M25562 Pain in left knee: Secondary | ICD-10-CM | POA: Diagnosis not present

## 2020-08-28 ENCOUNTER — Encounter: Payer: Self-pay | Admitting: Dietician

## 2020-08-28 ENCOUNTER — Other Ambulatory Visit: Payer: Self-pay

## 2020-08-28 ENCOUNTER — Encounter: Payer: Medicare Other | Attending: Family Medicine | Admitting: Dietician

## 2020-08-28 DIAGNOSIS — M0609 Rheumatoid arthritis without rheumatoid factor, multiple sites: Secondary | ICD-10-CM | POA: Diagnosis not present

## 2020-08-28 DIAGNOSIS — E1143 Type 2 diabetes mellitus with diabetic autonomic (poly)neuropathy: Secondary | ICD-10-CM | POA: Diagnosis not present

## 2020-08-28 NOTE — Patient Instructions (Addendum)
Consider reading Living well! With Gastroparesis by Lambert Keto  Consider a supplement of magnesium Restart a Multivitamin  Be sure to stay hydrated. Aim to eat every 2 1/2 hours.  Meal planning tips: Small portions several times per day. Choose a protein option- boiled egg, cottage cheese, sugar free pudding, low fat cheese, very well cooked, lean meat Choose a grain- Cream of wheat or cream of rice, white rice, white bread, pasta or noodles, crackers, graham crackers Canned fruit or canned vegetable or well cooked as tolerated  Small meals as tolerated. Breakfast option:  Cream of wheat, canned fruit, boiled egg  Cottage cheese and canned fruit  White toast, boiled egg, canned fruit  Smoothie (frozen or fresh banana, frozen or canned peaches or melon, lactaid milk/yogurt/Ensure)  Other meal options:  Baked potato without skin, cottage cheese, canned or well cooked green beans or carrots  Soup, crackers   Drink 2 Ensure between meals  Avoid adding much added fat

## 2020-08-28 NOTE — Progress Notes (Addendum)
Medical Nutrition Therapy:  Appt start time: 0915 end time:  5638.   Assessment:  Primary concerns today: .  Patient is here today alone. She was referred for type 2 diabetes and gastroparesis.  Patient states that she has had a lot of pain from the gastroparesis.  She states that she does not know what to eat and when to eat.  Early satiety.  History includes Type 2 Diabetes, gastroparesis, CKD, HTN, HLD, rheumatoid arthritis, OSA on c-pap, IBS/C. Labs noted to include A1C 6% 07/26/2020, BUN 15, Creatinine 1.42, Potassium 3.5, eGFR 38 07/27/2020.  A1C 6.9% 06/23/2018 [facility]). Allergies include Folic Acid and Red dye but she tolerates a MVI and food which contain these added ingredients.  Lactose intolerance.  Weight hx: 158 lbs 08/28/2020 Lost 35 lbs in the past 6 weeks due to gastroparesis. 205 lbs 01/25/2020 226 lbs 2017 235-240 lbs for a long time since 2017  Patient's daughter and granddaughter live with her.  Her 51 yo granddaughter has epilepsy with severe seizures 4-5 times per week.  Angel French is very supportive to her granddaughter including needs at night. Patient does not eat with her daughter.  Angel French usually has groceries delivered or shops but does not drive and relies on her friend. Widowed 2009.  She has a kitchen in her bedroom.  She does not cook much.  Preferred Learning Style:   No preference indicated   Learning Readiness:   Contemplating   MEDICATIONS: See list to include Novolog, Levemir. She is not currently taking insulin as she is not eating well and has been losing weight.  She has been instructed to take 6 units of Novolog if her blood sugar is more than 180 but has not needed this. Fasting blood sugar 100-110.   DIETARY INTAKE: States that she will never give up coffee even if it causes her a great deal of pain as this is one thing that she has control of.  Avoided foods include milk (lactose intolerant.    24-hr recall:  B (  AM): Never a breakfast person, occasional blueberry muffin, rare oatmeal (prefers cream of wheat) Snk ( AM):   L ( PM):  Snk ( PM):  D ( PM): mashed potatoes, peas, green beans, OR beef and barley soup Snk ( PM):  Beverages: 4 Ensure Protein Max, coffee (2-3 mugs) with powdered non fat creamer and splenda, grape juice or diet juice  Usual physical activity: limited, walking with a walker due to injury to knee in fall down the stairs  Estimated energy needs: 1500-1600 calories 60-70 grams protein  Progress Towards Goal(s):  In progress.   Nutritional Diagnosis:  NB-1.1 Food and nutrition-related knowledge deficit As related to gastroparesis.  As evidenced by diet hx and patient report.    Intervention:  Nutrition education related to gastroparesis.  Discussed need for small, frequent meals, supplement options that are more balanced for her needs, constipation concerns, sources of protein and how to balance protein to avoid excess due to her CKD. Discussed simple meal planning.  Plan: Consider reading Living well! With Gastroparesis by Lambert Keto  Consider a supplement of magnesium Restart a Multivitamin  Be sure to stay hydrated. Aim to eat every 2 1/2 hours.  Meal planning tips: Small portions several times per day. Choose a protein option- boiled egg, cottage cheese, sugar free pudding, low fat cheese, very well cooked, lean meat Choose a grain- Cream of wheat or cream of rice, white rice, white bread, pasta  or noodles, crackers, graham crackers Canned fruit or canned vegetable or well cooked as tolerated  Small meals as tolerated. Breakfast option:  Cream of wheat, canned fruit, boiled egg  Cottage cheese and canned fruit  White toast, boiled egg, canned fruit  Smoothie (frozen or fresh banana, frozen or canned peaches or melon, lactaid milk/yogurt/Ensure)  Other meal options:  Baked potato without skin, cottage cheese, canned or well cooked green beans or  carrots  Soup, crackers  Drink 2 Ensure between meals Avoid adding much added fat   Teaching Method Utilized:  Ship broker  Handouts given during visit include:  Gastroparesis nutrition therapy from [facility]  Barriers to learning/adherence to lifestyle change: health, stress  Demonstrated degree of understanding via:  Teach Back   Monitoring/Evaluation:  Dietary intake, exercise, and body weight prn.

## 2020-09-02 DIAGNOSIS — M25662 Stiffness of left knee, not elsewhere classified: Secondary | ICD-10-CM | POA: Diagnosis not present

## 2020-09-02 DIAGNOSIS — M25562 Pain in left knee: Secondary | ICD-10-CM | POA: Diagnosis not present

## 2020-09-02 DIAGNOSIS — M6281 Muscle weakness (generalized): Secondary | ICD-10-CM | POA: Diagnosis not present

## 2020-09-02 DIAGNOSIS — R262 Difficulty in walking, not elsewhere classified: Secondary | ICD-10-CM | POA: Diagnosis not present

## 2020-09-04 DIAGNOSIS — M25562 Pain in left knee: Secondary | ICD-10-CM | POA: Diagnosis not present

## 2020-09-04 DIAGNOSIS — R262 Difficulty in walking, not elsewhere classified: Secondary | ICD-10-CM | POA: Diagnosis not present

## 2020-09-04 DIAGNOSIS — M25662 Stiffness of left knee, not elsewhere classified: Secondary | ICD-10-CM | POA: Diagnosis not present

## 2020-09-04 DIAGNOSIS — M6281 Muscle weakness (generalized): Secondary | ICD-10-CM | POA: Diagnosis not present

## 2020-09-08 DIAGNOSIS — G894 Chronic pain syndrome: Secondary | ICD-10-CM | POA: Diagnosis not present

## 2020-09-08 DIAGNOSIS — E1142 Type 2 diabetes mellitus with diabetic polyneuropathy: Secondary | ICD-10-CM | POA: Diagnosis not present

## 2020-09-08 DIAGNOSIS — F320 Major depressive disorder, single episode, mild: Secondary | ICD-10-CM | POA: Diagnosis not present

## 2020-09-08 DIAGNOSIS — R634 Abnormal weight loss: Secondary | ICD-10-CM | POA: Diagnosis not present

## 2020-09-08 DIAGNOSIS — Z794 Long term (current) use of insulin: Secondary | ICD-10-CM | POA: Diagnosis not present

## 2020-09-08 DIAGNOSIS — K3184 Gastroparesis: Secondary | ICD-10-CM | POA: Diagnosis not present

## 2020-09-08 DIAGNOSIS — E1143 Type 2 diabetes mellitus with diabetic autonomic (poly)neuropathy: Secondary | ICD-10-CM | POA: Diagnosis not present

## 2020-09-08 DIAGNOSIS — R1084 Generalized abdominal pain: Secondary | ICD-10-CM | POA: Diagnosis not present

## 2020-09-09 DIAGNOSIS — M25562 Pain in left knee: Secondary | ICD-10-CM | POA: Diagnosis not present

## 2020-09-09 DIAGNOSIS — M25662 Stiffness of left knee, not elsewhere classified: Secondary | ICD-10-CM | POA: Diagnosis not present

## 2020-09-09 DIAGNOSIS — R262 Difficulty in walking, not elsewhere classified: Secondary | ICD-10-CM | POA: Diagnosis not present

## 2020-09-09 DIAGNOSIS — M6281 Muscle weakness (generalized): Secondary | ICD-10-CM | POA: Diagnosis not present

## 2020-09-10 DIAGNOSIS — K551 Chronic vascular disorders of intestine: Secondary | ICD-10-CM | POA: Diagnosis not present

## 2020-09-10 DIAGNOSIS — R1084 Generalized abdominal pain: Secondary | ICD-10-CM | POA: Diagnosis not present

## 2020-09-10 DIAGNOSIS — R110 Nausea: Secondary | ICD-10-CM | POA: Diagnosis not present

## 2020-09-10 DIAGNOSIS — R634 Abnormal weight loss: Secondary | ICD-10-CM | POA: Diagnosis not present

## 2020-09-11 ENCOUNTER — Other Ambulatory Visit (HOSPITAL_COMMUNITY): Payer: Self-pay | Admitting: Gastroenterology

## 2020-09-11 ENCOUNTER — Other Ambulatory Visit: Payer: Self-pay | Admitting: Gastroenterology

## 2020-09-11 DIAGNOSIS — R110 Nausea: Secondary | ICD-10-CM

## 2020-09-11 DIAGNOSIS — R1084 Generalized abdominal pain: Secondary | ICD-10-CM

## 2020-09-11 DIAGNOSIS — K551 Chronic vascular disorders of intestine: Secondary | ICD-10-CM

## 2020-09-15 NOTE — Progress Notes (Signed)
Cardiology Office Note:   Date:  09/16/2020  NAME:  Angel French    MRN: [id number] DOB:  1951/10/17   PCP:  Wardell Honour, MD  Cardiologist:  No primary care provider on file.   Referring MD: Wardell Honour, MD   Chief Complaint  Patient presents with  . Follow-up   History of Present Illness:   Angel French is a 69 y.o. female with a hx of DM, HTN, CKD3, fibromyalgia who presents for follow-up. Seen for CP and had normal stress test and echo.  Angel French reports he is doing well.  Angel French has been diagnosed with gastroparesis.  Apparently a lot of Angel French chest pain symptoms were likely coming from that.  Angel French primary care physician is also ordered a CTA of Angel French abdomen to exclude mesenteric ischemia.  Angel French really has no symptoms of that.  Angel French blood pressures well controlled today.  Angel French is diabetic and on a high intensity statin.  Angel French most recent LDL cholesterol was slightly above goal.  Triglycerides were also elevated.  Angel French is lost about 50 pounds.  Plans for recheck by Angel French primary care physician.  I suspect Angel French cholesterol will be much improved.  Blood pressures well controlled today.  Angel French is not exercising that much due to arthritis.  Hopefully this will improve in the next few months.  Problem List 1. Diabetes -A1c 5.1 2. Hypertension  3. HLD -T chol 162, HDL 37, LDL 80, TG 225 4. OSA 5. Fibromyalgia 6. Rheumatoid arthritis  7. CKD 3  Past Medical History: Past Medical History:  Diagnosis Date  . Allergy    generic allergy pill; Spring and Fall only.  . Anxiety   . Arthritis    DDD lumbar, R hip OA.  s/p ortho consult in past.  . Blood transfusion without reported diagnosis    Mountain climbing accident in [place].  . Brachial plexus disorders   . Cataract    B retractions.  . Chronic kidney disease    stage 3 per pt.   . Chronic pain syndrome   . Chronic renal insufficiency, stage 3 (moderate) (HCC)   . Constipation   . DDD (degenerative disc disease), lumbar    . Depression   . Diabetes mellitus   . Diabetic peripheral neuropathy associated with type 2 diabetes mellitus ([place])   . Diabetic retinopathy ([place])   . Diabetic retinopathy associated with type 2 diabetes mellitus ([place])    s/p laser treatment multiple.  Unable to drive.  . Fatty liver   . Fibromyalgia   . Food allergy   . GERD (gastroesophageal reflux disease)   . Hypercholesteremia   . Hyperlipidemia   . Hypertension    controlled, off meds   . IBS (irritable bowel syndrome)   . Leg edema   . Neuromuscular disorder ([place])   . OSA (obstructive sleep apnea)   . Osteoarthritis   . Rheumatic fever   . Rheumatoid arthritis ([place])   . Stomach ulcer   . Swallowing difficulty   . TIA (transient ischemic attack)   . Ulcer    Peptic ulcer H. Pylori + s/p treatment.  Upper GI diagnosed.[name] Prilosec PRN .    Past Surgical History: Past Surgical History:  Procedure Laterality Date  .  2 SPINAL INJECTIONS     . ABDOMINAL HYSTERECTOMY  11/02/1979   DUB; cervical dysplasia; ovaries intact.  . ABDOMINAL SURGERY     staph abcess   . [facility] Admission  age 69; three months in [place].  Marland Kitchen BIOPSY  07/26/2020   Procedure: BIOPSY;  Surgeon: Arta Silence, MD;  Location: [facility];  Service: Endoscopy;;  . BREAST BIOPSY    . CARDIAC CATHETERIZATION  11/02/2007   normal coronary arteries.  . CARPAL TUNNEL RELEASE     Bilateral.  . CATARACT EXTRACTION, BILATERAL    . CHOLECYSTECTOMY    . ESOPHAGEAL MANOMETRY N/A 09/14/2017   Procedure: ESOPHAGEAL MANOMETRY (EM);  Surgeon: Ronnette Juniper, MD;  Location: [facility];  Service: Gastroenterology;  Laterality: N/A;  . ESOPHAGOGASTRODUODENOSCOPY (EGD) WITH PROPOFOL N/A 07/26/2020   Procedure: ESOPHAGOGASTRODUODENOSCOPY (EGD) WITH PROPOFOL;  Surgeon: Arta Silence, MD;  Location: [facility];  Service: Endoscopy;  Laterality: N/A;  . EYE SURGERY     Cataracts B. Laser surgery x 7 for Diabetic Retinopathy  . TONSILLECTOMY       Current Medications: Current Meds  Medication Sig  . acetaminophen (TYLENOL) 325 MG tablet Take 650 mg by mouth every 6 (six) hours as needed (every 6 weeks before RA infusion).  Marland Kitchen allopurinol (ZYLOPRIM) 100 MG tablet Take 1 tablet (100 mg total) by mouth daily. Ov needed  . amLODipine (NORVASC) 5 MG tablet Take 1 tablet (5 mg total) by mouth daily.  Marland Kitchen aspirin EC 81 MG tablet Take 1 tablet (81 mg total) by mouth daily. Swallow whole.  . Blood Glucose Monitoring Suppl (BLOOD GLUCOSE METER KIT AND SUPPLIES) KIT Dispense based on patient and insurance preference. Use up to four times daily as directed. (FOR ICD-9 250.00, 250.01).  . cholecalciferol (VITAMIN D3) 25 MCG (1000 UNIT) tablet Take 1,000 Units by mouth daily.  . diclofenac sodium (VOLTAREN) 1 % GEL Apply 2 g topically 4 (four) times daily. (Patient taking differently: Apply 2 g topically 4 (four) times daily as needed (painful joints). )  . diphenhydrAMINE (BENADRYL) 25 MG tablet Take 25 mg by mouth every 6 (six) hours as needed (every 6 weeks prior to RA infusion).  . DULoxetine (CYMBALTA) 30 MG capsule TAKE 1 CAPSULE BY MOUTH EVERY DAY (Patient taking differently: Take 30 mg by mouth daily. )  . escitalopram (LEXAPRO) 20 MG tablet TAKE 1 TABLET BY MOUTH EVERY DAY (Patient taking differently: Take 10 mg by mouth daily. )  . famotidine (PEPCID) 40 MG tablet Take 40 mg by mouth at bedtime.  . feeding supplement, ENSURE ENLIVE, (ENSURE ENLIVE) LIQD Take 237 mLs by mouth 3 (three) times daily between meals.  . furosemide (LASIX) 20 MG tablet Take 1-3 tablets (20-60 mg total) by mouth daily.  Marland Kitchen glucose blood test strip Check sugar three times daily  Dx: DMII insulin dependent with retinopathy, neuropathy controlled  . insulin aspart (NOVOLOG) 100 UNIT/ML injection Inject 25 Units into the skin 3 (three) times daily with meals.  . Insulin Syringes, Disposable, U-100 0.5 ML MISC 28 Units by Does not apply route 2 (two) times daily.  Marland Kitchen  leflunomide (ARAVA) 20 MG tablet Take 20 mg by mouth daily.   Marland Kitchen LEVEMIR 100 UNIT/ML injection INJECT 60 UNITS AT BEDTIME AS DIRECTED  . linaclotide (LINZESS) 290 MCG CAPS capsule Take 290 mcg by mouth daily before breakfast.   . Multiple Vitamin (MULTIVITAMIN WITH MINERALS) TABS tablet Take 1 tablet by mouth daily.  . Needles & Syringes MISC 1 Syringe by Does not apply route 2 (two) times daily.  Marland Kitchen omeprazole (PRILOSEC) 20 MG capsule TAKE 1 CAPSULE BY MOUTH EVERY DAY (Patient taking differently: Take 20 mg by mouth daily. )  . oxybutynin (  DITROPAN XL) 15 MG 24 hr tablet TAKE 1 TABLET BY MOUTH AT BEDTIME (Patient taking differently: Take 15 mg by mouth at bedtime. )  . predniSONE (DELTASONE) 5 MG tablet Take 5 mg by mouth daily as needed. Only takes with RA Flare.  . rosuvastatin (CRESTOR) 10 MG tablet Take 1 tablet (10 mg total) by mouth daily.  . traZODone (DESYREL) 100 MG tablet TAKE 2 TABLETS BY MOUTH EVERY DAY AT BEDTIME (Patient taking differently: Take 200 mg by mouth at bedtime. )  . ULTICARE INSULIN SYRINGE 31G X 5/16" 0.5 ML MISC USE AS DIRECTED TO INJECT INSULIN 2 TIMES DAILY     Allergies:    Codeine, Contrast media [iodinated diagnostic agents], Nitrofurantoin monohyd macro, Betadine [povidone iodine], Folic acid, Gabapentin, Iodine, Lyrica [pregabalin], Red dye, and Ultram [tramadol hcl]   Social History: Social History   Socioeconomic History  . Marital status: Widowed    Spouse name: Marijean Niemann  . Number of children: 2  . Years of education: college  . Highest education level: Not on file  Occupational History  . Occupation: retired    Comment: retretied  Tobacco Use  . Smoking status: Former Smoker    Years: 22.00  . Smokeless tobacco: Never Used  . Tobacco comment: Quit 1987  Substance and Sexual Activity  . Alcohol use: No    Alcohol/week: 0.0 standard drinks  . Drug use: No  . Sexual activity: Yes    Birth control/protection: Surgical, Post-menopausal     Comment: widow  Other Topics Concern  . Not on file  Social History Narrative   Marital status: widowed since 2009; dating x 6 years.  Happy; no abuse.      Children: 2 children (63 daughter, 73 son estranged); 2 grandchildren.      Lives: with boyfriend, daughter, granddaughter, friend of daughter.  Lives in pt house.      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  Diabetic retinopathy; unable to drive.      Tobacco:  Smoked x 20 years; quit 20 years.      Alcohol:  On special occasions; once per week on average.       Drugs:  None since college.      Exercise:  Walking several times per week; walks the dog.   Education college   Caffeine one cup daily.   Right handed      Advanced Directives: none; FULL CODE.  DNR/DNI.  HCPOA: [place]?           Social Determinants of Health   Financial Resource Strain:   . Difficulty of Paying Living Expenses: Not on file  Food Insecurity:   . Worried About Charity fundraiser in the Last Year: Not on file  . Ran Out of Food in the Last Year: Not on file  Transportation Needs:   . Lack of Transportation (Medical): Not on file  . Lack of Transportation (Non-Medical): Not on file  Physical Activity:   . Days of Exercise per Week: Not on file  . Minutes of Exercise per Session: Not on file  Stress:   . Feeling of Stress : Not on file  Social Connections:   . Frequency of Communication with Friends and Family: Not on file  . Frequency of Social Gatherings with Friends and Family: Not on file  . Attends Religious Services: Not on file  . Active Member of Clubs or Organizations: Not on file  . Attends Archivist  Meetings: Not on file  . Marital Status: Not on file     Family History: The patient's family history includes Breast cancer in Angel French daughter. Angel French was adopted.  ROS:   All other ROS reviewed and negative. Pertinent positives noted in the HPI.     EKGs/Labs/Other Studies Reviewed:   The following  studies were personally reviewed by me today:  NM Stress 08/05/2020  Low risk, probably normal stress nuclear study with apical thinning but no ischemia.  Gated ejection fraction 52% with normal wall motion.  TTE 07/30/2020  1. Left ventricular ejection fraction, by estimation, is 65 to 70%. The  left ventricle has hyperdynamic function. The left ventricle has no  regional wall motion abnormalities. Left ventricular diastolic parameters  are consistent with Grade I diastolic  dysfunction (impaired relaxation).  2. Right ventricular systolic function is normal. The right ventricular  size is normal. Tricuspid regurgitation signal is inadequate for assessing  PA pressure.  3. The mitral valve is normal in structure. No evidence of mitral valve  regurgitation. No evidence of mitral stenosis.  4. The aortic valve is tricuspid. Aortic valve regurgitation is not  visualized. No aortic stenosis is present.  5. The inferior vena cava is normal in size with greater than 50%  respiratory variability, suggesting right atrial pressure of 3 mmHg.  6. Increased flow velocities may be secondary to anemia, thyrotoxicosis,  hyperdynamic or high flow state.   Recent Labs: 07/10/2020: TSH 0.872 07/26/2020: ALT 13 07/27/2020: BUN 15; Creatinine, Ser 1.42; Hemoglobin 10.7; Magnesium 2.0; Platelets 156; Potassium 3.5; Sodium 139   Recent Lipid Panel    Component Value Date/Time   CHOL 162 01/02/2019 1258   TRIG 225 (H) 01/02/2019 1258   HDL 37 (L) 01/02/2019 1258   CHOLHDL 3.9 08/03/2018 1209   CHOLHDL 5.3 (H) 09/08/2016 1035   VLDL 46 (H) 09/08/2016 1035   LDLCALC 80 01/02/2019 1258    Physical Exam:   VS:  BP 138/68   Pulse (!) 111   Ht 5' 2.5" (1.588 m)   Wt 159 lb 9.6 oz (72.4 kg)   SpO2 98%   BMI 28.73 kg/m    Wt Readings from Last 3 Encounters:  09/16/20 159 lb 9.6 oz (72.4 kg)  08/28/20 158 lb (71.7 kg)  07/30/20 170 lb (77.1 kg)    General: Well nourished, well developed, in  no acute distress Heart: Atraumatic, normal size  Eyes: PEERLA, EOMI  Neck: Supple, no JVD Endocrine: No thryomegaly Cardiac: Normal S1, S2; RRR; no murmurs, rubs, or gallops Lungs: Clear to auscultation bilaterally, no wheezing, rhonchi or rales  Abd: Soft, nontender, no hepatomegaly  Ext: No edema, pulses 2+ Musculoskeletal: No deformities, BUE and BLE strength normal and equal Skin: Warm and dry, no rashes   Neuro: Alert and oriented to person, place, time, and situation, CNII-XII grossly intact, no focal deficits  Psych: Normal mood and affect   ASSESSMENT:   Avaleigh Lenon French is a 69 y.o. female who presents for the following: 1. Chest pain, unspecified type   2. Mixed hyperlipidemia   3. Essential hypertension     PLAN:   1. Chest pain, unspecified type -Atypical chest pain.  Normal nuclear medicine stress test.  Normal echocardiogram.  Suspect Angel French symptoms were gastroparesis related.  No further work-up needed.  2. Mixed hyperlipidemia -Angel French will continue aspirin high intensity statin.  Goal LDL cholesterol is less than 70.  Also goal triglycerides are less than 150.  I will see Angel French  yearly to discuss these.  3. Essential hypertension -Well-controlled.  No change in medications.  Disposition: Return in about 1 year (around 09/16/2021).  Medication Adjustments/Labs and Tests Ordered: Current medicines are reviewed at length with the patient today.  Concerns regarding medicines are outlined above.  No orders of the defined types were placed in this encounter.  No orders of the defined types were placed in this encounter.   Patient Instructions  Medication Instructions:  Continue same medications *If you need a refill on your cardiac medications before your next appointment, please call your pharmacy*   Lab Work: None ordered   Testing/Procedures: None ordered   Follow-Up: At [facility], you and your health needs are our priority.  As part of our  continuing mission to provide you with exceptional heart care, we have created designated Provider Care Teams.  These Care Teams include your primary Cardiologist (physician) and Advanced Practice Providers (APPs -  Physician Assistants and Nurse Practitioners) who all work together to provide you with the care you need, when you need it.  We recommend signing up for the patient portal called "MyChart".  Sign up information is provided on this After Visit Summary.  MyChart is used to connect with patients for Virtual Visits (Telemedicine).  Patients are able to view lab/test results, encounter notes, upcoming appointments, etc.  Non-urgent messages can be sent to your provider as well.   To learn more about what you can do with MyChart, go to [email or web address].    Your next appointment:  1 year   Call in July to schedule Nov appointment   The format for your next appointment: Office     Provider: Dr.O'Neal       Time Spent with Patient: I have spent a total of 25 minutes with patient reviewing hospital notes, telemetry, EKGs, labs and examining the patient as well as establishing an assessment and plan that was discussed with the patient.  > 50% of time was spent in direct patient care.  Signed, Addison Naegeli. Audie Box, [place]  [address] [place], [place] [address] [phone]  09/16/2020 10:17 AM

## 2020-09-16 ENCOUNTER — Ambulatory Visit (INDEPENDENT_AMBULATORY_CARE_PROVIDER_SITE_OTHER): Payer: Medicare Other | Admitting: Cardiovascular Disease

## 2020-09-16 ENCOUNTER — Other Ambulatory Visit: Payer: Self-pay

## 2020-09-16 ENCOUNTER — Encounter: Payer: Self-pay | Admitting: Cardiovascular Disease

## 2020-09-16 VITALS — BP 138/68 | HR 111 | Ht 62.5 in | Wt 159.6 lb

## 2020-09-16 DIAGNOSIS — E782 Mixed hyperlipidemia: Secondary | ICD-10-CM | POA: Diagnosis not present

## 2020-09-16 DIAGNOSIS — R079 Chest pain, unspecified: Secondary | ICD-10-CM | POA: Diagnosis not present

## 2020-09-16 DIAGNOSIS — I10 Essential (primary) hypertension: Secondary | ICD-10-CM

## 2020-09-16 NOTE — Patient Instructions (Signed)
Medication Instructions:  Continue same medications *If you need a refill on your cardiac medications before your next appointment, please call your pharmacy*   Lab Work: None ordered   Testing/Procedures: None ordered   Follow-Up: At [facility], you and your health needs are our priority.  As part of our continuing mission to provide you with exceptional heart care, we have created designated Provider Care Teams.  These Care Teams include your primary Cardiologist (physician) and Advanced Practice Providers (APPs -  Physician Assistants and Nurse Practitioners) who all work together to provide you with the care you need, when you need it.  We recommend signing up for the patient portal called "MyChart".  Sign up information is provided on this After Visit Summary.  MyChart is used to connect with patients for Virtual Visits (Telemedicine).  Patients are able to view lab/test results, encounter notes, upcoming appointments, etc.  Non-urgent messages can be sent to your provider as well.   To learn more about what you can do with MyChart, go to [email or web address].    Your next appointment:  1 year   Call in July to schedule Nov appointment   The format for your next appointment: Office     Provider: Dr.O'Neal

## 2020-09-17 DIAGNOSIS — M6281 Muscle weakness (generalized): Secondary | ICD-10-CM | POA: Diagnosis not present

## 2020-09-18 DIAGNOSIS — R262 Difficulty in walking, not elsewhere classified: Secondary | ICD-10-CM | POA: Diagnosis not present

## 2020-09-18 DIAGNOSIS — M94262 Chondromalacia, left knee: Secondary | ICD-10-CM | POA: Diagnosis not present

## 2020-09-18 DIAGNOSIS — M25662 Stiffness of left knee, not elsewhere classified: Secondary | ICD-10-CM | POA: Diagnosis not present

## 2020-09-18 DIAGNOSIS — M6281 Muscle weakness (generalized): Secondary | ICD-10-CM | POA: Diagnosis not present

## 2020-09-20 DIAGNOSIS — M545 Low back pain, unspecified: Secondary | ICD-10-CM | POA: Diagnosis not present

## 2020-09-23 DIAGNOSIS — R262 Difficulty in walking, not elsewhere classified: Secondary | ICD-10-CM | POA: Diagnosis not present

## 2020-09-23 DIAGNOSIS — M25662 Stiffness of left knee, not elsewhere classified: Secondary | ICD-10-CM | POA: Diagnosis not present

## 2020-09-23 DIAGNOSIS — M94262 Chondromalacia, left knee: Secondary | ICD-10-CM | POA: Diagnosis not present

## 2020-09-23 DIAGNOSIS — M6281 Muscle weakness (generalized): Secondary | ICD-10-CM | POA: Diagnosis not present

## 2020-09-24 DIAGNOSIS — M545 Low back pain, unspecified: Secondary | ICD-10-CM | POA: Diagnosis not present

## 2020-09-24 DIAGNOSIS — N183 Chronic kidney disease, stage 3 unspecified: Secondary | ICD-10-CM | POA: Diagnosis not present

## 2020-09-24 DIAGNOSIS — N189 Chronic kidney disease, unspecified: Secondary | ICD-10-CM | POA: Diagnosis not present

## 2020-09-24 DIAGNOSIS — N2581 Secondary hyperparathyroidism of renal origin: Secondary | ICD-10-CM | POA: Diagnosis not present

## 2020-09-24 DIAGNOSIS — I129 Hypertensive chronic kidney disease with stage 1 through stage 4 chronic kidney disease, or unspecified chronic kidney disease: Secondary | ICD-10-CM | POA: Diagnosis not present

## 2020-09-24 DIAGNOSIS — D631 Anemia in chronic kidney disease: Secondary | ICD-10-CM | POA: Diagnosis not present

## 2020-09-24 DIAGNOSIS — E1122 Type 2 diabetes mellitus with diabetic chronic kidney disease: Secondary | ICD-10-CM | POA: Diagnosis not present

## 2020-09-24 DIAGNOSIS — M25662 Stiffness of left knee, not elsewhere classified: Secondary | ICD-10-CM | POA: Diagnosis not present

## 2020-10-08 ENCOUNTER — Encounter (HOSPITAL_COMMUNITY)
Admission: RE | Admit: 2020-10-08 | Discharge: 2020-10-08 | Disposition: A | Discharge status: Home / Self Care | Payer: Medicare Other | Source: Ambulatory Visit | Attending: Gastroenterology | Admitting: Gastroenterology

## 2020-10-08 ENCOUNTER — Ambulatory Visit (HOSPITAL_COMMUNITY): Admission: RE | Admit: 2020-10-08 | Payer: Medicare Other | Source: Ambulatory Visit

## 2020-10-08 ENCOUNTER — Other Ambulatory Visit: Payer: Self-pay

## 2020-10-08 DIAGNOSIS — R1084 Generalized abdominal pain: Secondary | ICD-10-CM | POA: Insufficient documentation

## 2020-10-08 DIAGNOSIS — R110 Nausea: Secondary | ICD-10-CM | POA: Insufficient documentation

## 2020-10-08 DIAGNOSIS — R140 Abdominal distension (gaseous): Secondary | ICD-10-CM | POA: Diagnosis not present

## 2020-10-08 DIAGNOSIS — R109 Unspecified abdominal pain: Secondary | ICD-10-CM | POA: Diagnosis not present

## 2020-10-08 DIAGNOSIS — R6881 Early satiety: Secondary | ICD-10-CM | POA: Diagnosis not present

## 2020-10-08 MED ORDER — TECHNETIUM TC 99M SULFUR COLLOID
2.0000 | Freq: Once | INTRAVENOUS | Status: AC | PRN
  Administered 2020-10-08: 2 via INTRAVENOUS

## 2020-10-09 DIAGNOSIS — M25562 Pain in left knee: Secondary | ICD-10-CM | POA: Diagnosis not present

## 2020-10-15 DIAGNOSIS — M0609 Rheumatoid arthritis without rheumatoid factor, multiple sites: Secondary | ICD-10-CM | POA: Diagnosis not present

## 2020-10-28 ENCOUNTER — Ambulatory Visit (HOSPITAL_COMMUNITY)
Admission: RE | Admit: 2020-10-28 | Discharge: 2020-10-28 | Disposition: A | Discharge status: Home / Self Care | Payer: Medicare Other | Source: Ambulatory Visit | Attending: Gastroenterology | Admitting: Gastroenterology

## 2020-10-28 ENCOUNTER — Other Ambulatory Visit: Payer: Self-pay

## 2020-10-28 DIAGNOSIS — Z9049 Acquired absence of other specified parts of digestive tract: Secondary | ICD-10-CM | POA: Diagnosis not present

## 2020-10-28 DIAGNOSIS — K551 Chronic vascular disorders of intestine: Secondary | ICD-10-CM

## 2020-10-28 DIAGNOSIS — R1084 Generalized abdominal pain: Secondary | ICD-10-CM

## 2020-10-28 DIAGNOSIS — Z90710 Acquired absence of both cervix and uterus: Secondary | ICD-10-CM | POA: Diagnosis not present

## 2020-10-28 DIAGNOSIS — I700 Atherosclerosis of aorta: Secondary | ICD-10-CM | POA: Diagnosis not present

## 2020-10-28 DIAGNOSIS — R110 Nausea: Secondary | ICD-10-CM | POA: Diagnosis not present

## 2020-10-28 DIAGNOSIS — R109 Unspecified abdominal pain: Secondary | ICD-10-CM | POA: Diagnosis not present

## 2020-10-28 MED ORDER — IOHEXOL 350 MG/ML SOLN
75.0000 mL | Freq: Once | INTRAVENOUS | Status: AC | PRN
  Administered 2020-10-28: 75 mL via INTRAVENOUS

## 2020-10-29 DIAGNOSIS — M545 Low back pain, unspecified: Secondary | ICD-10-CM | POA: Diagnosis not present

## 2020-12-08 ENCOUNTER — Other Ambulatory Visit: Payer: Medicare Other

## 2020-12-08 DIAGNOSIS — Z20822 Contact with and (suspected) exposure to covid-19: Secondary | ICD-10-CM

## 2020-12-09 LAB — SARS-COV-2, NAA 2 DAY TAT

## 2020-12-09 LAB — NOVEL CORONAVIRUS, NAA: SARS-CoV-2, NAA: NOT DETECTED

## 2021-07-02 ENCOUNTER — Telehealth: Payer: Self-pay

## 2021-07-02 ENCOUNTER — Telehealth: Payer: Self-pay | Admitting: Internal Medicine

## 2021-07-02 NOTE — Telephone Encounter (Signed)
Ok with me but ideally needs to be ok with former PCP as well  Pt understands that I do not treat chronic pain per discussion today

## 2021-07-02 NOTE — Telephone Encounter (Signed)
Left message to call back and ask to speak with preop team.  Darreld Mclean, PA-C 07/02/2021 3:29 PM

## 2021-07-02 NOTE — Telephone Encounter (Signed)
Patient came into the office with her spouse, discussed being a new patient of Dr.John during the visit.   Please advise.

## 2021-07-02 NOTE — Telephone Encounter (Signed)
   [facility] Pre-operative Risk Assessment    Patient Name: Pietra Zuluaga  DOB: Mar 26, 1951 MRN: [id number]  HEARTCARE STAFF:  - IMPORTANT!!!!!! Under Visit Info/Reason for Call, type in Other and utilize the format Clearance MM/DD/YY or Clearance TBD. Do not use dashes or single digits. - Please review there is not already an duplicate clearance open for this procedure. - If request is for dental extraction, please clarify the # of teeth to be extracted. - If the patient is currently at the dentist's office, call Pre-Op Callback Staff (MA/nurse) to input urgent request.  - If the patient is not currently in the dentist office, please route to the Pre-Op pool.  Request for surgical clearance:  What type of surgery is being performed? Lt Knee scope Medial & Lat meniscectomy   When is this surgery scheduled? TBD  What type of clearance is required (medical clearance vs. Pharmacy clearance to hold med vs. Both)? Cardiac    Are there any medications that need to be held prior to surgery and how long? Aspirin   Practice name and name of physician performing surgery? [facility] A. Noemi Chapel, MD   What is the office phone number? [phone]   7.   What is the office fax number? [phone] Attn: Abbie   8.   Anesthesia type (None, local, MAC, general) ? Unknown    Zebedee Iba 07/02/2021, 3:02 PM  _________________________________________________________________   (provider comments below)

## 2021-07-09 NOTE — Telephone Encounter (Signed)
Patient is returning call.  °

## 2021-07-10 NOTE — Telephone Encounter (Addendum)
   Name: Angel French  DOB: [birth year]  MRN: [id number]   Primary Cardiologist: Evalina Field, MD  Chart reviewed as part of pre-operative protocol coverage. Patient was contacted 07/10/2021 in reference to pre-operative risk assessment for pending surgery as outlined below.  Angel French was last seen on 09/16/2020 by Dr Audie Box.  Since that day, Angel French has done well without any exertional chest pain or worsening dyspnea.  Echocardiogram obtained in September 2021 and nuclear stress test obtained in October 2021 were both normal.  She has been doing well since the last visit.  Therefore, based on ACC/AHA guidelines, the patient would be at acceptable risk for the planned procedure without further cardiovascular testing.   Patient may hold aspirin for 5 to 7 days prior to the surgery and restart as soon as possible afterward at the surgeon's discretion.  The patient was advised that if she develops new symptoms prior to surgery to contact our office to arrange for a follow-up visit, and she verbalized understanding.  I will route this recommendation to the requesting party via Epic fax function and remove from pre-op pool. Please call with questions.  [place], [place] 07/10/2021, 6:33 PM

## 2021-07-14 ENCOUNTER — Other Ambulatory Visit: Payer: Self-pay | Admitting: Family Medicine

## 2021-07-14 DIAGNOSIS — Z1231 Encounter for screening mammogram for malignant neoplasm of breast: Secondary | ICD-10-CM

## 2021-07-21 ENCOUNTER — Telehealth (INDEPENDENT_AMBULATORY_CARE_PROVIDER_SITE_OTHER): Payer: Medicare Other | Admitting: Adult Health

## 2021-07-21 DIAGNOSIS — G4733 Obstructive sleep apnea (adult) (pediatric): Secondary | ICD-10-CM | POA: Diagnosis not present

## 2021-07-21 DIAGNOSIS — Z9989 Dependence on other enabling machines and devices: Secondary | ICD-10-CM

## 2021-07-21 NOTE — Progress Notes (Signed)
PATIENT: Angel French DOB: 08-04-1951  REASON FOR VISIT: follow up HISTORY FROM: patient PRIMARY NEUROLOGIST:   Virtual Visit via Video Note  I connected with Angel French on 07/21/21 at  1:15 PM EDT by a video enabled telemedicine application located remotely at [facility] and verified that I am speaking with the correct person using two identifiers who was located at their own home.   I discussed the limitations of evaluation and management by telemedicine and the availability of in person appointments. The patient expressed understanding and agreed to proceed.   PATIENT: Angel French DOB: 1951-03-23  REASON FOR VISIT: follow up HISTORY FROM: patient  HISTORY OF PRESENT ILLNESS: Today 07/21/21:  Ms. [name] is a 70 year old female with a history of obstructive sleep apnea on CPAP.  She reports that she has lost close to 90 pounds.  She reports that she has since gotten married.  Her spouse reports that she no longer snores.  She does not feel tired throughout the day.  Reports that she is no longer diabetic.  Overall she is feeling great.  She does not wish to repeat sleep test.  HISTORY 07/02/20:   Ms. [name] is a 70 year old female with a history of obstructive sleep apnea on CPAP.  Her download indicates that she use her machine 29 out of 30 days for compliance of 97%.  She use her machine greater than 4 hours 28 days for compliance of 93%.  Her residual AHI is 2.2 on 5 to 15 cm of water with EPR of 3.  Leak in the 95th percentile is 11.4 L/min.  She states that night the mask sometimes wakes her up leaking.  She does state that she is a restless sleeper  REVIEW OF SYSTEMS: Out of a complete 14 system review of symptoms, the patient complains only of the following symptoms, and all other reviewed systems are negative.  ALLERGIES: Allergies  Allergen Reactions   Codeine Anaphylaxis   Contrast Media [Iodinated Diagnostic Agents]  Anaphylaxis   Nitrofurantoin Monohyd Macro Anaphylaxis   Betadine [Povidone Iodine] Itching   Folic Acid Itching   Gabapentin Other (See Comments)    Makes patient feel drunk   Iodine Hives   Lyrica [Pregabalin] Other (See Comments)    Makes patient feel drunk   Red Dye Itching   Ultram [Tramadol Hcl] Nausea And Vomiting    HOME MEDICATIONS: Outpatient Medications Prior to Visit  Medication Sig Dispense Refill   acetaminophen (TYLENOL) 325 MG tablet Take 650 mg by mouth every 6 (six) hours as needed (every 6 weeks before RA infusion).     allopurinol (ZYLOPRIM) 100 MG tablet Take 1 tablet (100 mg total) by mouth daily. Ov needed 90 tablet 3   amLODipine (NORVASC) 5 MG tablet Take 1 tablet (5 mg total) by mouth daily. 30 tablet 0   aspirin EC 81 MG tablet Take 1 tablet (81 mg total) by mouth daily. Swallow whole. 90 tablet 3   Blood Glucose Monitoring Suppl (BLOOD GLUCOSE METER KIT AND SUPPLIES) KIT Dispense based on patient and insurance preference. Use up to four times daily as directed. (FOR ICD-9 250.00, 250.01). 1 each 11   cholecalciferol (VITAMIN D3) 25 MCG (1000 UNIT) tablet Take 1,000 Units by mouth daily.     diclofenac sodium (VOLTAREN) 1 % GEL Apply 2 g topically 4 (four) times daily. (Patient taking differently: Apply 2 g topically 4 (four) times daily as needed (painful joints). ) 100 g 3  diphenhydrAMINE (BENADRYL) 25 MG tablet Take 25 mg by mouth every 6 (six) hours as needed (every 6 weeks prior to RA infusion).     DULoxetine (CYMBALTA) 30 MG capsule TAKE 1 CAPSULE BY MOUTH EVERY DAY (Patient taking differently: Take 30 mg by mouth daily. ) 90 capsule 3   escitalopram (LEXAPRO) 20 MG tablet TAKE 1 TABLET BY MOUTH EVERY DAY (Patient taking differently: Take 10 mg by mouth daily. ) 90 tablet 1   famotidine (PEPCID) 40 MG tablet Take 40 mg by mouth at bedtime.     feeding supplement, ENSURE ENLIVE, (ENSURE ENLIVE) LIQD Take 237 mLs by mouth 3 (three) times daily between  meals. 237 mL 12   furosemide (LASIX) 20 MG tablet Take 1-3 tablets (20-60 mg total) by mouth daily. 200 tablet 1   glucose blood test strip Check sugar three times daily  Dx: DMII insulin dependent with retinopathy, neuropathy controlled 300 each 3   insulin aspart (NOVOLOG) 100 UNIT/ML injection Inject 25 Units into the skin 3 (three) times daily with meals. 10 mL 11   Insulin Syringes, Disposable, U-100 0.5 ML MISC 28 Units by Does not apply route 2 (two) times daily. 100 each 11   leflunomide (ARAVA) 20 MG tablet Take 20 mg by mouth daily.      LEVEMIR 100 UNIT/ML injection INJECT 60 UNITS AT BEDTIME AS DIRECTED 20 mL 1   linaclotide (LINZESS) 290 MCG CAPS capsule Take 290 mcg by mouth daily before breakfast.      Multiple Vitamin (MULTIVITAMIN WITH MINERALS) TABS tablet Take 1 tablet by mouth daily.     Needles & Syringes MISC 1 Syringe by Does not apply route 2 (two) times daily. 100 each 11   omeprazole (PRILOSEC) 20 MG capsule TAKE 1 CAPSULE BY MOUTH EVERY DAY (Patient taking differently: Take 20 mg by mouth daily. ) 90 capsule 3   oxybutynin (DITROPAN XL) 15 MG 24 hr tablet TAKE 1 TABLET BY MOUTH AT BEDTIME (Patient taking differently: Take 15 mg by mouth at bedtime. ) 90 tablet 3   predniSONE (DELTASONE) 5 MG tablet Take 5 mg by mouth daily as needed. Only takes with RA Flare.     rosuvastatin (CRESTOR) 10 MG tablet Take 1 tablet (10 mg total) by mouth daily. 90 tablet 1   traZODone (DESYREL) 100 MG tablet TAKE 2 TABLETS BY MOUTH EVERY DAY AT BEDTIME (Patient taking differently: Take 200 mg by mouth at bedtime. ) 180 tablet 1   ULTICARE INSULIN SYRINGE 31G X 5/16" 0.5 ML MISC USE AS DIRECTED TO INJECT INSULIN 2 TIMES DAILY 100 each 4   No facility-administered medications prior to visit.    PAST MEDICAL HISTORY: Past Medical History:  Diagnosis Date   Allergy    generic allergy pill; Spring and Fall only.   Anxiety    Arthritis    DDD lumbar, R hip OA.  s/p ortho consult in past.    Blood transfusion without reported diagnosis    Mountain climbing accident in [place].   Brachial plexus disorders    Cataract    B retractions.   Chronic kidney disease    stage 3 per pt.    Chronic pain syndrome    Chronic renal insufficiency, stage 3 (moderate) (HCC)    Constipation    DDD (degenerative disc disease), lumbar    Depression    Diabetes mellitus    Diabetic peripheral neuropathy associated with type 2 diabetes mellitus ([place])    Diabetic retinopathy ([place])  Diabetic retinopathy associated with type 2 diabetes mellitus ([place])    s/p laser treatment multiple.  Unable to drive.   Fatty liver    Fibromyalgia    Food allergy    GERD (gastroesophageal reflux disease)    Hypercholesteremia    Hyperlipidemia    Hypertension    controlled, off meds    IBS (irritable bowel syndrome)    Leg edema    Neuromuscular disorder (HCC)    OSA (obstructive sleep apnea)    Osteoarthritis    Rheumatic fever    Rheumatoid arthritis (HCC)    Stomach ulcer    Swallowing difficulty    TIA (transient ischemic attack)    Ulcer    Peptic ulcer H. Pylori + s/p treatment.  Upper GI diagnosed.[name] Prilosec PRN .    PAST SURGICAL HISTORY: Past Surgical History:  Procedure Laterality Date    2 SPINAL INJECTIONS      ABDOMINAL HYSTERECTOMY  11/02/1979   DUB; cervical dysplasia; ovaries intact.   ABDOMINAL SURGERY     staph abcess    [facility] Admission     age 89; three months in [place].   BIOPSY  07/26/2020   Procedure: BIOPSY;  Surgeon: Arta Silence, MD;  Location: [facility];  Service: Endoscopy;;   BREAST BIOPSY     CARDIAC CATHETERIZATION  11/02/2007   normal coronary arteries.   CARPAL TUNNEL RELEASE     Bilateral.   CATARACT EXTRACTION, BILATERAL     CHOLECYSTECTOMY     ESOPHAGEAL MANOMETRY N/A 09/14/2017   Procedure: ESOPHAGEAL MANOMETRY (EM);  Surgeon: Ronnette Juniper, MD;  Location: [facility];  Service: Gastroenterology;  Laterality: N/A;    ESOPHAGOGASTRODUODENOSCOPY (EGD) WITH PROPOFOL N/A 07/26/2020   Procedure: ESOPHAGOGASTRODUODENOSCOPY (EGD) WITH PROPOFOL;  Surgeon: Arta Silence, MD;  Location: [facility];  Service: Endoscopy;  Laterality: N/A;   EYE SURGERY     Cataracts B. Laser surgery x 7 for Diabetic Retinopathy   TONSILLECTOMY      FAMILY HISTORY: Family History  Adopted: Yes  Problem Relation Age of Onset   Breast cancer Daughter     SOCIAL HISTORY: Social History   Socioeconomic History   Marital status: Widowed    Spouse name: Advice worker   Number of children: 2   Years of education: college   Highest education level: Not on file  Occupational History   Occupation: retired    Comment: retretied  Tobacco Use   Smoking status: Former    Years: 22.00    Types: Cigarettes   Smokeless tobacco: Never   Tobacco comments:    Quit 1987  Substance and Sexual Activity   Alcohol use: No    Alcohol/week: 0.0 standard drinks   Drug use: No   Sexual activity: Yes    Birth control/protection: Surgical, Post-menopausal    Comment: widow  Other Topics Concern   Not on file  Social History Narrative   Marital status: widowed since 2009; dating x 6 years.  Happy; no abuse.      Children: 2 children (45 daughter, 42 son estranged); 2 grandchildren.      Lives: with boyfriend, daughter, granddaughter, friend of daughter.  Lives in pt house.      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  Diabetic retinopathy; unable to drive.      Tobacco:  Smoked x 20 years; quit 20 years.      Alcohol:  On special occasions; once per week on average.  Drugs:  None since college.      Exercise:  Walking several times per week; walks the dog.   Education college   Caffeine one cup daily.   Right handed      Advanced Directives: none; FULL CODE.  DNR/DNI.  HCPOA: [place]?           Social Determinants of Health   Financial Resource Strain: Not on file  Food Insecurity: Not on file   Transportation Needs: Not on file  Physical Activity: Not on file  Stress: Not on file  Social Connections: Not on file  Intimate Partner Violence: Not on file      PHYSICAL EXAM Generalized: Well developed, in no acute distress   Neurological examination  Mentation: Alert oriented to time, place, history taking. Follows all commands speech and language fluent Cranial nerve II-XII:Extraocular movements were full. Facial symmetry noted.  DIAGNOSTIC DATA (LABS, IMAGING, TESTING) - I reviewed patient records, labs, notes, testing and imaging myself where available.  Lab Results  Component Value Date   WBC 4.3 07/27/2020   HGB 10.7 (L) 07/27/2020   HCT 32.7 (L) 07/27/2020   MCV 95.9 07/27/2020   PLT 156 07/27/2020      Component Value Date/Time   NA 139 07/27/2020 1113   NA 143 01/02/2019 1258   K 3.5 07/27/2020 1113   CL 102 07/27/2020 1113   CO2 27 07/27/2020 1113   GLUCOSE 146 (H) 07/27/2020 1113   BUN 15 07/27/2020 1113   BUN 31 (H) 01/02/2019 1258   CREATININE 1.42 (H) 07/27/2020 1113   CREATININE 1.30 (H) 09/08/2016 1035   CALCIUM 8.9 07/27/2020 1113   PROT 6.7 07/26/2020 0355   PROT 7.6 01/02/2019 1258   ALBUMIN 3.6 07/26/2020 0355   ALBUMIN 4.3 01/02/2019 1258   AST 21 07/26/2020 0355   ALT 13 07/26/2020 0355   ALKPHOS 80 07/26/2020 0355   BILITOT 0.9 07/26/2020 0355   BILITOT 0.3 01/02/2019 1258   GFRNONAA 38 (L) 07/27/2020 1113   GFRNONAA 36 (L) 07/10/2014 1354   GFRAA 44 (L) 07/27/2020 1113   GFRAA 41 (L) 07/10/2014 1354   Lab Results  Component Value Date   CHOL 162 01/02/2019   HDL 37 (L) 01/02/2019   LDLCALC 80 01/02/2019   TRIG 225 (H) 01/02/2019   CHOLHDL 3.9 08/03/2018   Lab Results  Component Value Date   HGBA1C 6.0 (H) 07/26/2020   Lab Results  Component Value Date   VITAMINB12 466 01/30/2018   Lab Results  Component Value Date   TSH 0.872 07/10/2020      ASSESSMENT AND PLAN 70 y.o. year old female  has a past medical  history of Allergy, Anxiety, Arthritis, Blood transfusion without reported diagnosis, Brachial plexus disorders, Cataract, Chronic kidney disease, Chronic pain syndrome, Chronic renal insufficiency, stage 3 (moderate) (HCC), Constipation, DDD (degenerative disc disease), lumbar, Depression, Diabetes mellitus, Diabetic peripheral neuropathy associated with type 2 diabetes mellitus ([place]), Diabetic retinopathy ([place]), Diabetic retinopathy associated with type 2 diabetes mellitus ([place]), Fatty liver, Fibromyalgia, Food allergy, GERD (gastroesophageal reflux disease), Hypercholesteremia, Hyperlipidemia, Hypertension, IBS (irritable bowel syndrome), Leg edema, Neuromuscular disorder ([place]), OSA (obstructive sleep apnea), Osteoarthritis, Rheumatic fever, Rheumatoid arthritis ([place]), Stomach ulcer, Swallowing difficulty, TIA (transient ischemic attack), and Ulcer. here with:  OSA on CPAP  Patient discontinued CPAP.  Discussed repeating home sleep test but the patient deferred. Order will be sent to [place] discontinuing CPAP therapy patient will follow-up with our office on an as-needed basis   [facility]  Clabe Seal, MSN, NP-C 07/21/2021, 1:13 PM [facility] [address], [place], [place] [address] [phone]

## 2021-08-11 ENCOUNTER — Ambulatory Visit: Payer: Medicare Other

## 2021-08-17 ENCOUNTER — Ambulatory Visit: Payer: Medicare Other

## 2021-09-07 ENCOUNTER — Ambulatory Visit
Admission: RE | Admit: 2021-09-07 | Discharge: 2021-09-07 | Disposition: A | Discharge status: Home / Self Care | Payer: Medicare Other | Source: Ambulatory Visit | Attending: Family Medicine | Admitting: Family Medicine

## 2021-09-07 ENCOUNTER — Other Ambulatory Visit: Payer: Self-pay

## 2021-09-07 DIAGNOSIS — Z1231 Encounter for screening mammogram for malignant neoplasm of breast: Secondary | ICD-10-CM

## 2021-10-27 IMAGING — CT CT ABD-PELV W/O CM
2 of 4 series · 16 of 46 positions shown, 18 images · non-contrast
Comparison: 02/21/2020

CLINICAL DATA: Acute abdominal pain, nonlocalized

EXAM:
CT ABDOMEN AND PELVIS WITHOUT CONTRAST
TECHNIQUE: Multidetector CT imaging of the abdomen and pelvis was performed
following the standard protocol without IV contrast.

[Series 2: axial st · axial · 0.92mm/px · z∈[+904,+1324]mm · 13 of 98 slices shown, 15 images]
[im 7/98  soft-tissue]
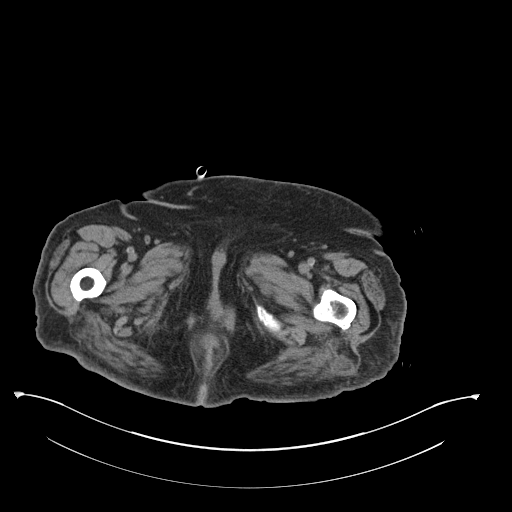
[im 7/98  bone]
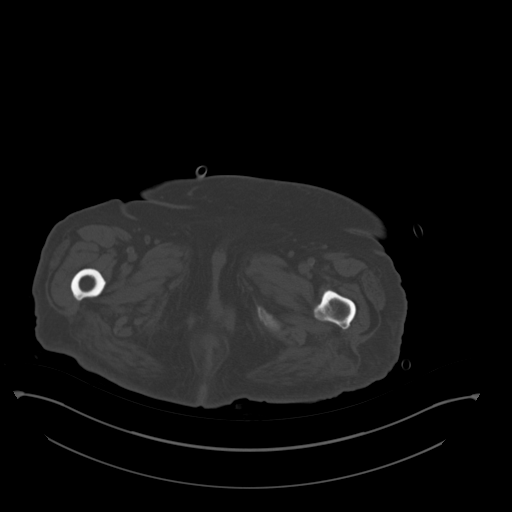
[im 13/98  soft-tissue]
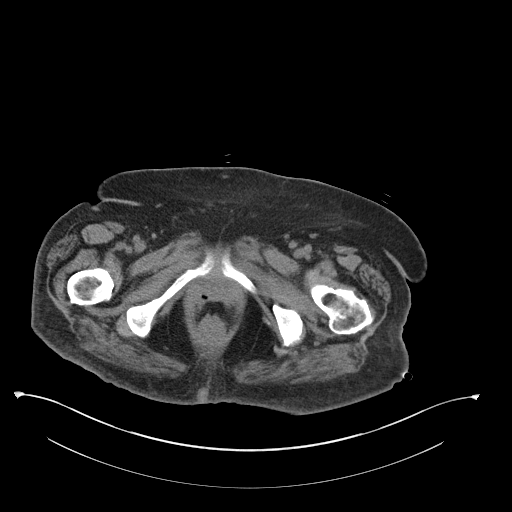
[im 19/98  soft-tissue]
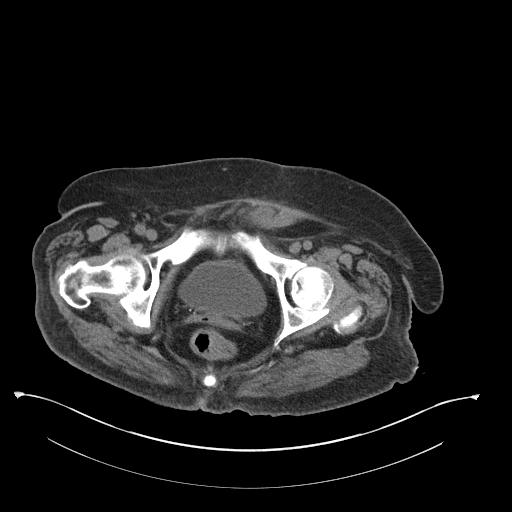
[im 31/98  soft-tissue]
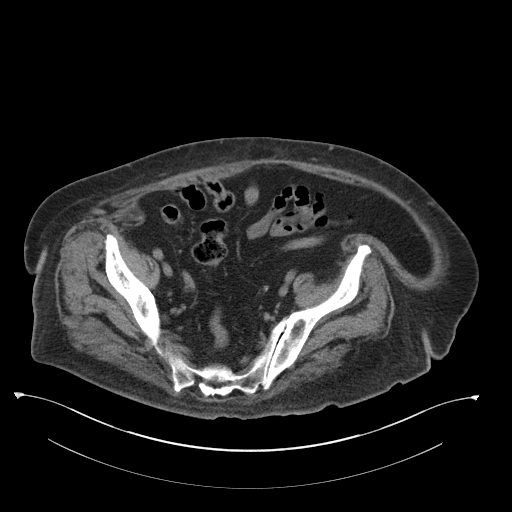
[im 37/98  soft-tissue]
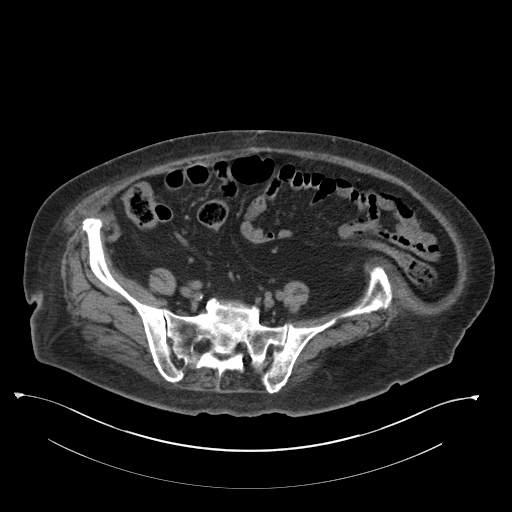
[im 43/98  soft-tissue]
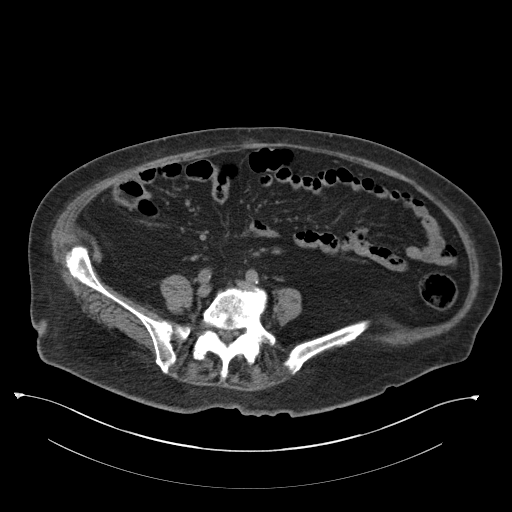
[im 49/98  soft-tissue]
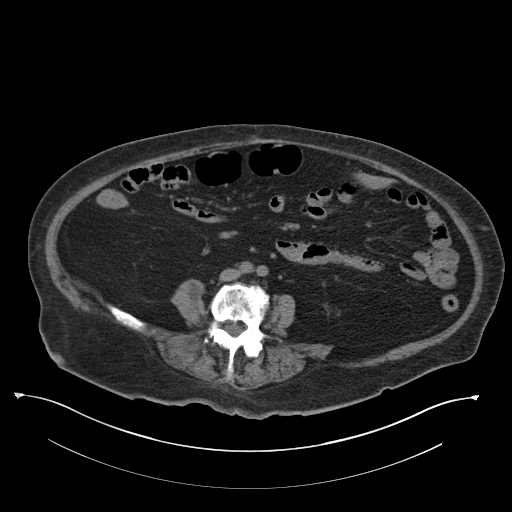
[im 55/98  soft-tissue]
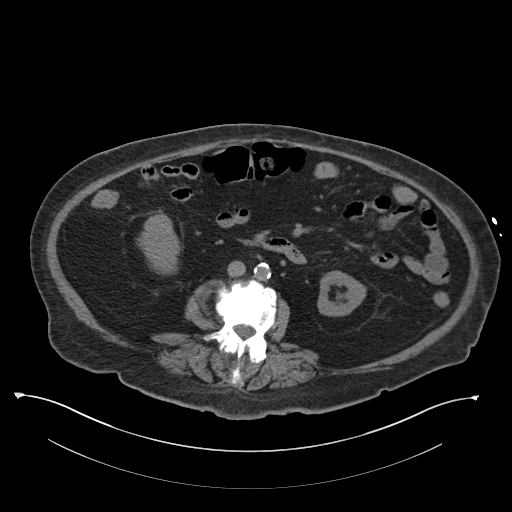
[im 61/98  soft-tissue]
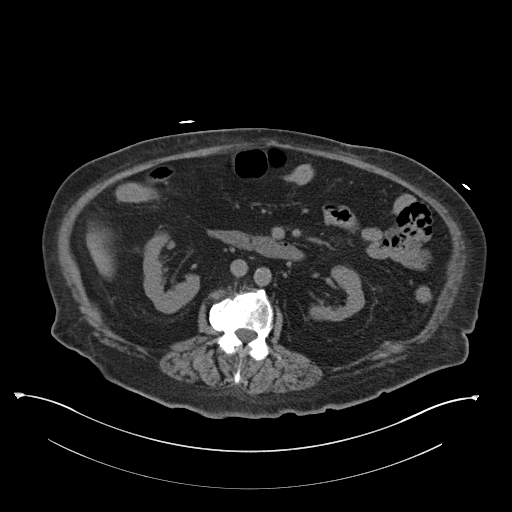
[im 61/98  bone]
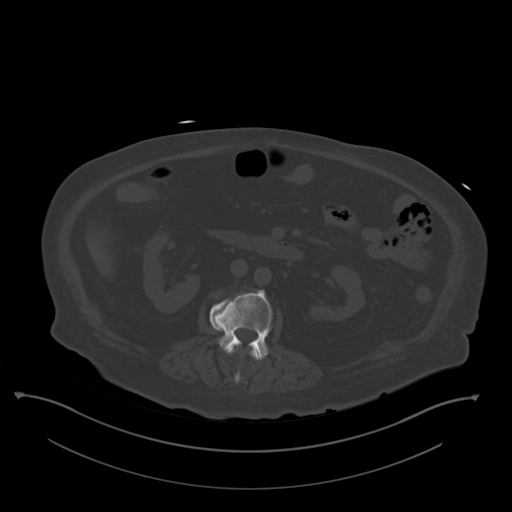
[im 67/98  soft-tissue]
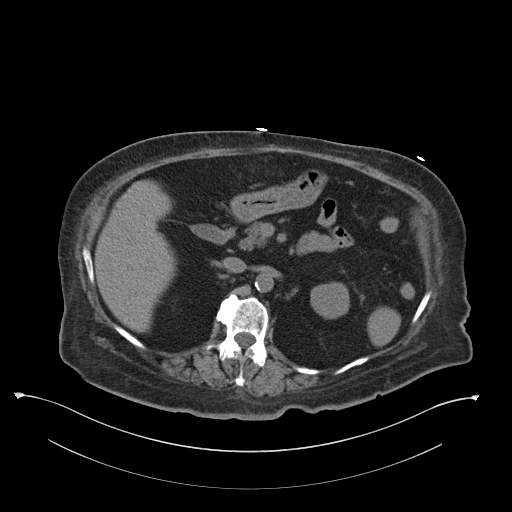
[im 79/98  soft-tissue]
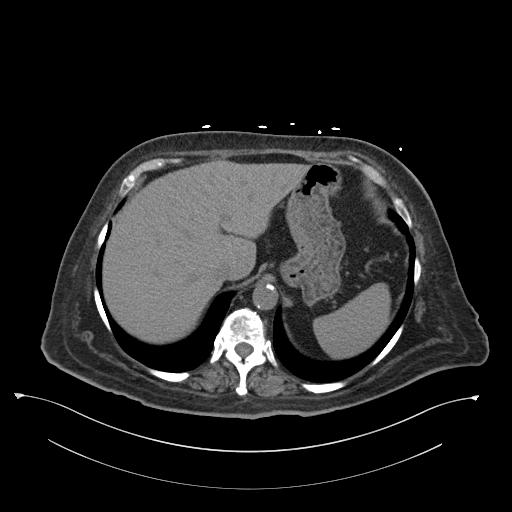
[im 85/98  soft-tissue]
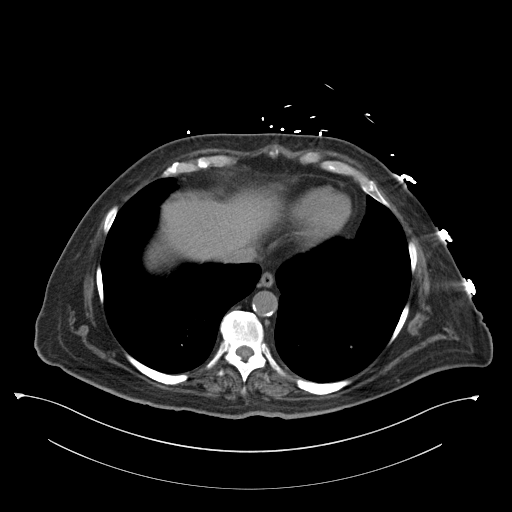
[im 91/98  soft-tissue]
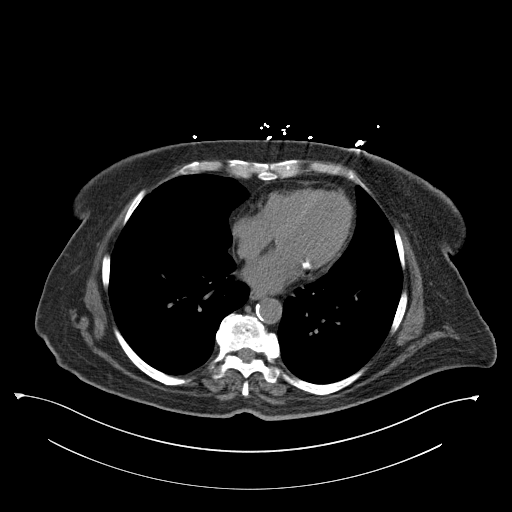

[Series 5: coronal st · coronal · 0.93mm/px · 3 of 164 slices shown]
[im 55/164  soft-tissue]
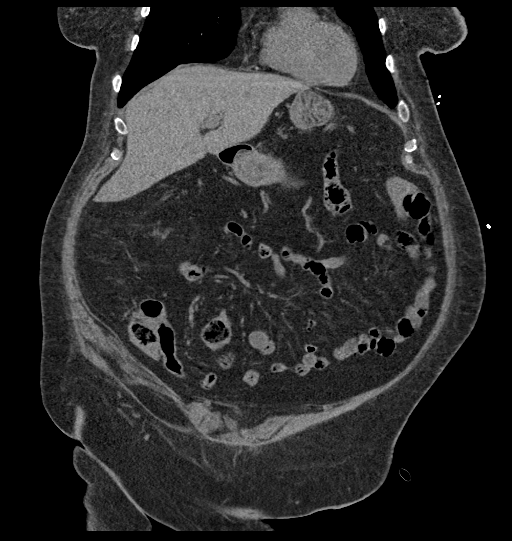
[im 73/164  soft-tissue]
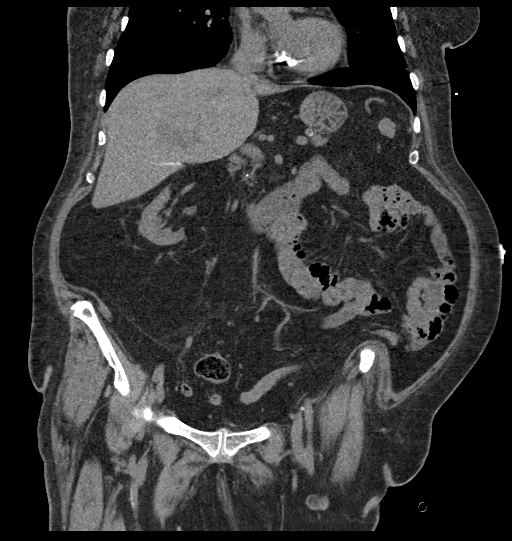
[im 91/164  soft-tissue]
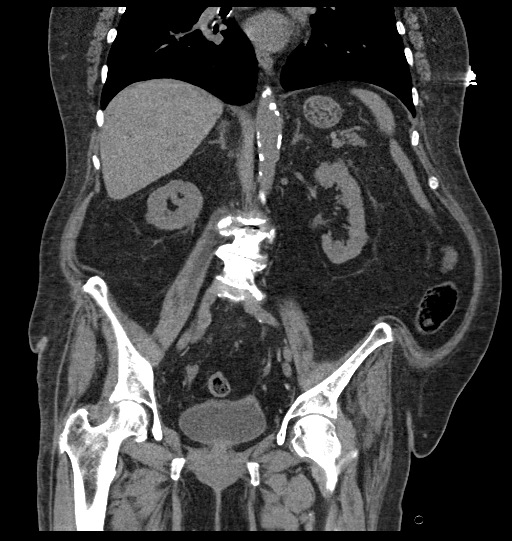

[16 of 46 positions shown; findings below may reference images not displayed]

FINDINGS: Lower chest: Incidental imaging of the lung bases without
consolidation or pleural effusion.

Hepatobiliary: No focal, suspicious hepatic abnormality. Post
cholecystectomy. No biliary duct dilation.

Pancreas: Pancreas without inflammation or contour abnormality.

Spleen: Spleen normal in size and contour.

Adrenals/Urinary Tract: Adrenal glands are normal.

Kidneys without hydronephrosis. Mild cortical scarring. No
nephrolithiasis.

Stomach/Bowel: Under distension of the colon in the ascending
through the transverse colon limiting assessment. Appendix is
normal. Stomach under distended limiting assessment. Small bowel
normal in course and caliber without signs of adjacent inflammation.

Vascular/Lymphatic: Atherosclerotic calcification of the abdominal
aorta. There is no gastrohepatic or hepatoduodenal ligament
lymphadenopathy. No retroperitoneal or mesenteric lymphadenopathy.
No pelvic sidewall lymphadenopathy.

Reproductive: Post hysterectomy. Cystic area in the LEFT adnexa. Gas
in the vagina. LEFT adnexal cystic area favored to be related to the
ovary based on relationship of vessels measuring 3.3 x 2.1 cm,
slightly diminished in size compared to the prior exam.

Other: No ascites.  No free air.

Musculoskeletal: No acute musculoskeletal process or destructive
bone finding. Spinal degenerative changes similar to the prior
study.
IMPRESSION: 1. Cystic area in the LEFT adnexa not significantly changed from
previous imaging. Based on previous ultrasound of the pelvis and
current ACR recommendations would suggest nonemergent ultrasound
follow-up for further evaluation.
2. Small amount of gas adjacent the vaginal apex without significant
surrounding stranding or change since the prior study may represent
redundant vaginal cuff. Correlate with any signs of vaginal drainage
or pelvic pain.
3. Under distension of the colon, question of mild hypervascularity
could be seen in the setting of mild colitis. Correlate with
symptoms and history.
4. Post cholecystectomy with normal appendix.

## 2021-12-07 NOTE — Progress Notes (Signed)
Cardiology Office Note:   Date:  12/08/2021  NAME:  Angel French    MRN: [id number] DOB:  02/26/1951   PCP:  Wardell Honour, MD  Cardiologist:  Evalina Field, MD  Electrophysiologist:  None   Referring MD: Wardell Honour, MD   Chief Complaint  Patient presents with   Follow-up    History of Present Illness:   Angel French is a 71 y.o. female with a hx of diabetes, hypertension, hyperlipidemia, CKD stage III who presents for follow-up.  Evaluated in 2021 for chest pain symptoms.  Negative nuclear medicine stress test.  Echocardiogram normal. She reports she is doing well.  Denies any chest pain or trouble breathing.  Blood pressure 134/58.  We discussed stopping her aspirin.  No strong indication for this.  She reports she is exercising.  No significant symptoms.  She is due for repeat cholesterol check.  This will be done by her primary care physician.  A1c still nondiabetic.  She has maintained Crestor 10 mg daily.  Overall without any complaints.  Problem List 1. Diabetes -A1c 5.1 2. Hypertension  3. HLD -T chol 162, HDL 37, LDL 80, TG 225 4. OSA 5. Fibromyalgia 6. Rheumatoid arthritis  7. CKD 3  Past Medical History: Past Medical History:  Diagnosis Date   Allergy    generic allergy pill; Spring and Fall only.   Anxiety    Arthritis    DDD lumbar, R hip OA.  s/p ortho consult in past.   Blood transfusion without reported diagnosis    Mountain climbing accident in [place].   Brachial plexus disorders    Cataract    B retractions.   Chronic kidney disease    stage 3 per pt.    Chronic pain syndrome    Chronic renal insufficiency, stage 3 (moderate) (HCC)    Constipation    DDD (degenerative disc disease), lumbar    Depression    Diabetes mellitus    Diabetic peripheral neuropathy associated with type 2 diabetes mellitus (HCC)    Diabetic retinopathy ([place])    Diabetic retinopathy associated with type 2 diabetes mellitus ([place])    s/p laser treatment  multiple.  Unable to drive.   Fatty liver    Fibromyalgia    Food allergy    GERD (gastroesophageal reflux disease)    Hypercholesteremia    Hyperlipidemia    Hypertension    controlled, off meds    IBS (irritable bowel syndrome)    Leg edema    Neuromuscular disorder (HCC)    OSA (obstructive sleep apnea)    Osteoarthritis    Rheumatic fever    Rheumatoid arthritis (HCC)    Stomach ulcer    Swallowing difficulty    TIA (transient ischemic attack)    Ulcer    Peptic ulcer H. Pylori + s/p treatment.  Upper GI diagnosed.[name] Prilosec PRN .    Past Surgical History: Past Surgical History:  Procedure Laterality Date    2 SPINAL INJECTIONS      ABDOMINAL HYSTERECTOMY  11/02/1979   DUB; cervical dysplasia; ovaries intact.   ABDOMINAL SURGERY     staph abcess    [facility] Admission     age 33; three months in [place].   BIOPSY  07/26/2020   Procedure: BIOPSY;  Surgeon: Arta Silence, MD;  Location: [facility];  Service: Endoscopy;;   BREAST BIOPSY     CARDIAC CATHETERIZATION  11/02/2007   normal coronary arteries.  CARPAL TUNNEL RELEASE     Bilateral.   CATARACT EXTRACTION, BILATERAL     CHOLECYSTECTOMY     ESOPHAGEAL MANOMETRY N/A 09/14/2017   Procedure: ESOPHAGEAL MANOMETRY (EM);  Surgeon: Ronnette Juniper, MD;  Location: [facility];  Service: Gastroenterology;  Laterality: N/A;   ESOPHAGOGASTRODUODENOSCOPY (EGD) WITH PROPOFOL N/A 07/26/2020   Procedure: ESOPHAGOGASTRODUODENOSCOPY (EGD) WITH PROPOFOL;  Surgeon: Arta Silence, MD;  Location: [facility];  Service: Endoscopy;  Laterality: N/A;   EYE SURGERY     Cataracts B. Laser surgery x 7 for Diabetic Retinopathy   TONSILLECTOMY      Current Medications: Current Meds  Medication Sig   acetaminophen (TYLENOL) 325 MG tablet Take 650 mg by mouth every 6 (six) hours as needed (every 6 weeks before RA infusion).   allopurinol (ZYLOPRIM) 100 MG tablet Take 1 tablet (100 mg total) by mouth daily. Ov needed    amLODipine (NORVASC) 2.5 MG tablet Take 2.5 mg by mouth daily in the afternoon.   bacitracin 500 UNIT/GM ointment SMARTSIG:Sparingly Topical As Directed   cholecalciferol (VITAMIN D3) 25 MCG (1000 UNIT) tablet Take 1,000 Units by mouth daily.   DULoxetine (CYMBALTA) 60 MG capsule Take 60 mg by mouth daily in the afternoon.   escitalopram (LEXAPRO) 20 MG tablet TAKE 1 TABLET BY MOUTH EVERY DAY (Patient taking differently: Take 10 mg by mouth daily.)   furosemide (LASIX) 20 MG tablet Take 20 mg by mouth daily in the afternoon.   leflunomide (ARAVA) 20 MG tablet Take 20 mg by mouth daily.    linaclotide (LINZESS) 290 MCG CAPS capsule Take 290 mcg by mouth daily before breakfast.    Multiple Vitamin (MULTIVITAMIN WITH MINERALS) TABS tablet Take 1 tablet by mouth daily.   omeprazole (PRILOSEC) 20 MG capsule TAKE 1 CAPSULE BY MOUTH EVERY DAY (Patient taking differently: Take 20 mg by mouth daily.)   oxybutynin (DITROPAN XL) 15 MG 24 hr tablet TAKE 1 TABLET BY MOUTH AT BEDTIME (Patient taking differently: Take 15 mg by mouth at bedtime.)   rosuvastatin (CRESTOR) 10 MG tablet Take 1 tablet (10 mg total) by mouth daily.   traZODone (DESYREL) 100 MG tablet TAKE 2 TABLETS BY MOUTH EVERY DAY AT BEDTIME (Patient taking differently: Take 200 mg by mouth at bedtime.)   [DISCONTINUED] aspirin EC 81 MG tablet Take 1 tablet (81 mg total) by mouth daily. Swallow whole.     Allergies:    Codeine, Contrast media [iodinated contrast media], Nitrofurantoin monohyd macro, Betadine [povidone iodine], Folic acid, Gabapentin, Iodine, Lyrica [pregabalin], Red dye, and Ultram [tramadol hcl]   Social History: Social History   Socioeconomic History   Marital status: Married    Spouse name: Advice worker   Number of children: 2   Years of education: college   Highest education level: Not on file  Occupational History   Occupation: retired    Comment: retretied  Tobacco Use   Smoking status: Former    Years: 22.00     Types: Cigarettes   Smokeless tobacco: Never   Tobacco comments:    Quit 1987  Substance and Sexual Activity   Alcohol use: No    Alcohol/week: 0.0 standard drinks   Drug use: No   Sexual activity: Yes    Birth control/protection: Surgical, Post-menopausal    Comment: widow  Other Topics Concern   Not on file  Social History Narrative   Marital status: widowed since 2009; dating x 6 years.  Happy; no abuse.      Children: 2 children (  62 daughter, 11 son estranged); 2 grandchildren.      Lives: with boyfriend, daughter, granddaughter, friend of daughter.  Lives in pt house.      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  Diabetic retinopathy; unable to drive.      Tobacco:  Smoked x 20 years; quit 20 years.      Alcohol:  On special occasions; once per week on average.       Drugs:  None since college.      Exercise:  Walking several times per week; walks the dog.   Education college   Caffeine one cup daily.   Right handed      Advanced Directives: none; FULL CODE.  DNR/DNI.  HCPOA: [place]?           Social Determinants of Health   Financial Resource Strain: Not on file  Food Insecurity: Not on file  Transportation Needs: Not on file  Physical Activity: Not on file  Stress: Not on file  Social Connections: Not on file    Family History: The patient's family history includes Breast cancer in her daughter. She was adopted.  ROS:   All other ROS reviewed and negative. Pertinent positives noted in the HPI.     EKGs/Labs/Other Studies Reviewed:   The following studies were personally reviewed by me today:  NM Stress 08/05/2020 The left ventricular ejection fraction is mildly decreased (45-54%). Nuclear stress EF: 52%. There was no ST segment deviation noted during stress. Defect 1: There is a small defect of moderate severity present in the apex location.   Low risk, probably normal stress nuclear study with apical thinning but no ischemia.   Gated ejection fraction 52% with normal wall motion.  TTE 07/30/2020  1. Left ventricular ejection fraction, by estimation, is 65 to 70%. The  left ventricle has hyperdynamic function. The left ventricle has no  regional wall motion abnormalities. Left ventricular diastolic parameters  are consistent with Grade I diastolic  dysfunction (impaired relaxation).   2. Right ventricular systolic function is normal. The right ventricular  size is normal. Tricuspid regurgitation signal is inadequate for assessing  PA pressure.   3. The mitral valve is normal in structure. No evidence of mitral valve  regurgitation. No evidence of mitral stenosis.   4. The aortic valve is tricuspid. Aortic valve regurgitation is not  visualized. No aortic stenosis is present.   5. The inferior vena cava is normal in size with greater than 50%  respiratory variability, suggesting right atrial pressure of 3 mmHg.   6. Increased flow velocities may be secondary to anemia, thyrotoxicosis,  hyperdynamic or high flow state.    Recent Labs: No results found for requested labs within last 8760 hours.   Recent Lipid Panel    Component Value Date/Time   CHOL 162 01/02/2019 1258   TRIG 225 (H) 01/02/2019 1258   HDL 37 (L) 01/02/2019 1258   CHOLHDL 3.9 08/03/2018 1209   CHOLHDL 5.3 (H) 09/08/2016 1035   VLDL 46 (H) 09/08/2016 1035   LDLCALC 80 01/02/2019 1258    Physical Exam:   VS:  BP (!) 134/58 (BP Location: Left Arm, Patient Position: Sitting, Cuff Size: Normal)    Pulse 78    Ht 5\' 2"  (1.575 m)    Wt 182 lb 6.4 oz (82.7 kg)    SpO2 100%    BMI 33.36 kg/m    Wt Readings from Last 3 Encounters:  12/08/21 182 lb 6.4 oz (  82.7 kg)  09/16/20 159 lb 9.6 oz (72.4 kg)  08/28/20 158 lb (71.7 kg)    General: Well nourished, well developed, in no acute distress Head: Atraumatic, normal size  Eyes: PEERLA, EOMI  Neck: Supple, no JVD Endocrine: No thryomegaly Cardiac: Normal S1, S2; RRR; no murmurs, rubs, or  gallops Lungs: Clear to auscultation bilaterally, no wheezing, rhonchi or rales  Abd: Soft, nontender, no hepatomegaly  Ext: No edema, pulses 2+ Musculoskeletal: No deformities, BUE and BLE strength normal and equal Skin: Warm and dry, no rashes   Neuro: Alert and oriented to person, place, time, and situation, CNII-XII grossly intact, no focal deficits  Psych: Normal mood and affect   ASSESSMENT:   LARINE FIELDING is a 71 y.o. female who presents for the following: 1. Mixed hyperlipidemia   2. Essential hypertension     PLAN:   1. Mixed hyperlipidemia 2. Essential hypertension -Evaluated for chest pain and shortness of breath in 2021.  Echo was normal.  Stress test was normal.  No need for aspirin.  She can stop this.  She will continue Crestor 10 mg daily.  Blood pressure is well controlled.  Recommended to continue current medicines.  I have also recommended proper diet and exercise.  She seems to be overall doing well and is without complaints today in office.  Disposition: Return in about 1 year (around 12/08/2022).  Medication Adjustments/Labs and Tests Ordered: Current medicines are reviewed at length with the patient today.  Concerns regarding medicines are outlined above.  No orders of the defined types were placed in this encounter.  No orders of the defined types were placed in this encounter.   Patient Instructions  Medication Instructions:  STOP Aspirin   *If you need a refill on your cardiac medications before your next appointment, please call your pharmacy*   Follow-Up: At [facility], you and your health needs are our priority.  As part of our continuing mission to provide you with exceptional heart care, we have created designated Provider Care Teams.  These Care Teams include your primary Cardiologist (physician) and Advanced Practice Providers (APPs -  Physician Assistants and Nurse Practitioners) who all work together to provide you with the care you need,  when you need it.  We recommend signing up for the patient portal called "MyChart".  Sign up information is provided on this After Visit Summary.  MyChart is used to connect with patients for Virtual Visits (Telemedicine).  Patients are able to view lab/test results, encounter notes, upcoming appointments, etc.  Non-urgent messages can be sent to your provider as well.   To learn more about what you can do with MyChart, go to [email or web address].    Your next appointment:   As needed  The format for your next appointment:   In Person  Provider:   Evalina Field, MD       Time Spent with Patient: I have spent a total of 25 minutes with patient reviewing hospital notes, telemetry, EKGs, labs and examining the patient as well as establishing an assessment and plan that was discussed with the patient.  > 50% of time was spent in direct patient care.  Signed, Addison Naegeli. Audie Box, MD, [place]  [address], [place], [place] [address] [phone]  12/08/2021 3:37 PM

## 2021-12-08 ENCOUNTER — Ambulatory Visit (INDEPENDENT_AMBULATORY_CARE_PROVIDER_SITE_OTHER): Payer: Medicare Other | Admitting: Cardiovascular Disease

## 2021-12-08 ENCOUNTER — Other Ambulatory Visit: Payer: Self-pay

## 2021-12-08 ENCOUNTER — Encounter: Payer: Self-pay | Admitting: Cardiovascular Disease

## 2021-12-08 VITALS — BP 134/58 | HR 78 | Ht 62.0 in | Wt 182.4 lb

## 2021-12-08 DIAGNOSIS — I10 Essential (primary) hypertension: Secondary | ICD-10-CM | POA: Diagnosis not present

## 2021-12-08 DIAGNOSIS — E782 Mixed hyperlipidemia: Secondary | ICD-10-CM

## 2021-12-08 NOTE — Patient Instructions (Signed)
Medication Instructions:  STOP Aspirin   *If you need a refill on your cardiac medications before your next appointment, please call your pharmacy*   Follow-Up: At [facility], you and your health needs are our priority.  As part of our continuing mission to provide you with exceptional heart care, we have created designated Provider Care Teams.  These Care Teams include your primary Cardiologist (physician) and Advanced Practice Providers (APPs -  Physician Assistants and Nurse Practitioners) who all work together to provide you with the care you need, when you need it.  We recommend signing up for the patient portal called "MyChart".  Sign up information is provided on this After Visit Summary.  MyChart is used to connect with patients for Virtual Visits (Telemedicine).  Patients are able to view lab/test results, encounter notes, upcoming appointments, etc.  Non-urgent messages can be sent to your provider as well.   To learn more about what you can do with MyChart, go to [email or web address].    Your next appointment:   As needed  The format for your next appointment:   In Person  Provider:   Evalina Field, MD

## 2021-12-31 ENCOUNTER — Other Ambulatory Visit: Payer: Self-pay

## 2021-12-31 ENCOUNTER — Ambulatory Visit (INDEPENDENT_AMBULATORY_CARE_PROVIDER_SITE_OTHER): Payer: Medicare Other | Admitting: Internal Medicine

## 2021-12-31 ENCOUNTER — Encounter: Payer: Self-pay | Admitting: Internal Medicine

## 2021-12-31 VITALS — BP 122/72 | HR 67 | Temp 98.6°F | Ht 62.0 in | Wt 188.0 lb

## 2021-12-31 DIAGNOSIS — N183 Chronic kidney disease, stage 3 unspecified: Secondary | ICD-10-CM | POA: Insufficient documentation

## 2021-12-31 DIAGNOSIS — Z23 Encounter for immunization: Secondary | ICD-10-CM | POA: Diagnosis not present

## 2021-12-31 DIAGNOSIS — E1142 Type 2 diabetes mellitus with diabetic polyneuropathy: Secondary | ICD-10-CM

## 2021-12-31 DIAGNOSIS — E7800 Pure hypercholesterolemia, unspecified: Secondary | ICD-10-CM

## 2021-12-31 DIAGNOSIS — Z794 Long term (current) use of insulin: Secondary | ICD-10-CM | POA: Diagnosis not present

## 2021-12-31 DIAGNOSIS — I10 Essential (primary) hypertension: Secondary | ICD-10-CM | POA: Diagnosis not present

## 2021-12-31 DIAGNOSIS — E559 Vitamin D deficiency, unspecified: Secondary | ICD-10-CM

## 2021-12-31 DIAGNOSIS — Z0001 Encounter for general adult medical examination with abnormal findings: Secondary | ICD-10-CM | POA: Insufficient documentation

## 2021-12-31 DIAGNOSIS — N1831 Chronic kidney disease, stage 3a: Secondary | ICD-10-CM

## 2021-12-31 LAB — BASIC METABOLIC PANEL
BUN: 28 mg/dL — ABNORMAL HIGH (ref 6–23)
CO2: 30 mEq/L (ref 19–32)
Calcium: 9.3 mg/dL (ref 8.4–10.5)
Chloride: 102 mEq/L (ref 96–112)
Creatinine, Ser: 1.19 mg/dL (ref 0.40–1.20)
GFR: 46.49 mL/min — ABNORMAL LOW (ref >60.00)
Glucose, Bld: 92 mg/dL (ref 70–99)
Potassium: 5.3 mEq/L — ABNORMAL HIGH (ref 3.5–5.1)
Sodium: 136 mEq/L (ref 135–145)

## 2021-12-31 LAB — CBC WITH DIFFERENTIAL/PLATELET
Basophils Absolute: 0.1 10*3/uL (ref 0.0–0.1)
Basophils Relative: 1.3 % (ref 0.0–3.0)
Eosinophils Absolute: 0.2 10*3/uL (ref 0.0–0.7)
Eosinophils Relative: 4.6 % (ref 0.0–5.0)
HCT: 30.4 % — ABNORMAL LOW (ref 36.0–46.0)
Hemoglobin: 10.1 g/dL — ABNORMAL LOW (ref 12.0–15.0)
Lymphocytes Relative: 33.3 % (ref 12.0–46.0)
Lymphs Abs: 1.6 10*3/uL (ref 0.7–4.0)
MCHC: 33.2 g/dL (ref 30.0–36.0)
MCV: 94.7 fl (ref 78.0–100.0)
Monocytes Absolute: 0.6 10*3/uL (ref 0.1–1.0)
Monocytes Relative: 13.1 % — ABNORMAL HIGH (ref 3.0–12.0)
Neutro Abs: 2.3 10*3/uL (ref 1.4–7.7)
Neutrophils Relative %: 47.7 % (ref 43.0–77.0)
Platelets: 214 10*3/uL (ref 150.0–400.0)
RBC: 3.21 Mil/uL — ABNORMAL LOW (ref 3.87–5.11)
RDW: 14.1 % (ref 11.5–15.5)
WBC: 4.9 10*3/uL (ref 4.0–10.5)

## 2021-12-31 LAB — LIPID PANEL
Cholesterol: 222 mg/dL — ABNORMAL HIGH (ref 0–200)
HDL: 46.2 mg/dL (ref >39.00)
NonHDL: 175.52
Total CHOL/HDL Ratio: 5
Triglycerides: 235 mg/dL — ABNORMAL HIGH (ref 0.0–149.0)
VLDL: 47 mg/dL — ABNORMAL HIGH (ref 0.0–40.0)

## 2021-12-31 LAB — VITAMIN D 25 HYDROXY (VIT D DEFICIENCY, FRACTURES): VITD: 65.92 ng/mL (ref 30.00–100.00)

## 2021-12-31 LAB — LDL CHOLESTEROL, DIRECT: Direct LDL: 140 mg/dL

## 2021-12-31 LAB — URINALYSIS, ROUTINE W REFLEX MICROSCOPIC
Bilirubin Urine: NEGATIVE
Hgb urine dipstick: NEGATIVE
Ketones, ur: NEGATIVE
Leukocytes,Ua: NEGATIVE
Nitrite: NEGATIVE
RBC / HPF: NONE SEEN (ref 0–?)
Specific Gravity, Urine: 1.01 (ref 1.000–1.030)
Total Protein, Urine: NEGATIVE
Urine Glucose: NEGATIVE
Urobilinogen, UA: 0.2 (ref 0.0–1.0)
pH: 6 (ref 5.0–8.0)

## 2021-12-31 LAB — HEPATIC FUNCTION PANEL
ALT: 11 U/L (ref 0–35)
AST: 17 U/L (ref 0–37)
Albumin: 4 g/dL (ref 3.5–5.2)
Alkaline Phosphatase: 99 U/L (ref 39–117)
Bilirubin, Direct: 0 mg/dL (ref 0.0–0.3)
Total Bilirubin: 0.3 mg/dL (ref 0.2–1.2)
Total Protein: 7 g/dL (ref 6.0–8.3)

## 2021-12-31 LAB — MICROALBUMIN / CREATININE URINE RATIO
Creatinine,U: 39.5 mg/dL
Microalb Creat Ratio: 2.4 mg/g (ref 0.0–30.0)
Microalb, Ur: 0.9 mg/dL (ref 0.0–1.9)

## 2021-12-31 LAB — HEMOGLOBIN A1C: Hgb A1c MFr Bld: 5.7 % (ref 4.6–6.5)

## 2021-12-31 LAB — TSH: TSH: 1.01 u[IU]/mL (ref 0.35–5.50)

## 2021-12-31 NOTE — Assessment & Plan Note (Signed)
Lab Results  ?Component Value Date  ? HGBA1C 6.0 (H) 07/26/2020  ? ?Stable, pt to continue current medical treatment novolog, levemir ?

## 2021-12-31 NOTE — Progress Notes (Signed)
Patient ID: Angel French, female   DOB: [birth year], 71 y.o.   MRN: [id number]         Chief Complaint:: yearly and new pt exam, dm,htn, hld, low vit d       HPI:  Angel French is a 71 y.o. female here for wellness exam; has eye appt mar 28; dye for pneumovax, o/w up to date.               Also Pt denies chest pain, increased sob or doe, wheezing, orthopnea, PND, increased LE swelling, palpitations, dizziness or syncope.   Pt denies polydipsia, polyuria, or new focal neuro s/s.  Pt denies fever, wt loss, night sweats, loss of appetite, or other constitutional symptoms  No other new complaints   Wt Readings from Last 3 Encounters:  12/31/21 188 lb (85.3 kg)  12/08/21 182 lb 6.4 oz (82.7 kg)  09/16/20 159 lb 9.6 oz (72.4 kg)   BP Readings from Last 3 Encounters:  12/31/21 122/72  12/08/21 (!) 134/58  09/16/20 138/68   Immunization History  Administered Date(s) Administered   Hepatitis A, Adult 10/06/2015, 08/12/2016   Influenza,inj,Quad PF,6+ Mos 07/16/2013, 07/10/2014, 10/15/2015, 08/12/2016, 06/28/2017   PFIZER Comirnaty(Gray Top)Covid-19 Tri-Sucrose Vaccine 01/10/2020, 02/06/2020, 08/07/2020, 03/05/2021   PFIZER(Purple Top)SARS-COV-2 Vaccination 01/10/2020, 02/06/2020   Pneumococcal Conjugate-13 03/23/2017   Pneumococcal Polysaccharide-23 07/10/2014   Tdap 10/06/2015   Health Maintenance Due  Topic Date Due   URINE MICROALBUMIN  03/08/2019   Pneumonia Vaccine 3+ Years old (3) 07/11/2019   HEMOGLOBIN A1C  01/23/2021      Past Medical History:  Diagnosis Date   Allergy    generic allergy pill; Spring and Fall only.   Anxiety    Arthritis    DDD lumbar, R hip OA.  s/p ortho consult in past.   Blood transfusion without reported diagnosis    Mountain climbing accident in [place].   Brachial plexus disorders    Cataract    B retractions.   Chronic kidney disease    stage 3 per pt.    Chronic pain syndrome    Chronic renal insufficiency, stage 3 (moderate) (HCC)     Constipation    DDD (degenerative disc disease), lumbar    Depression    Diabetes mellitus    Diabetic peripheral neuropathy associated with type 2 diabetes mellitus (HCC)    Diabetic retinopathy ([place])    Diabetic retinopathy associated with type 2 diabetes mellitus ([place])    s/p laser treatment multiple.  Unable to drive.   Fatty liver    Fibromyalgia    Food allergy    GERD (gastroesophageal reflux disease)    Hypercholesteremia    Hyperlipidemia    Hypertension    controlled, off meds    IBS (irritable bowel syndrome)    Leg edema    Neuromuscular disorder (HCC)    OSA (obstructive sleep apnea)    Osteoarthritis    Rheumatic fever    Rheumatoid arthritis (HCC)    Stomach ulcer    Swallowing difficulty    TIA (transient ischemic attack)    Ulcer    Peptic ulcer H. Pylori + s/p treatment.  Upper GI diagnosed.[name] Prilosec PRN .   Past Surgical History:  Procedure Laterality Date    2 SPINAL INJECTIONS      ABDOMINAL HYSTERECTOMY  11/02/1979   DUB; cervical dysplasia; ovaries intact.   ABDOMINAL SURGERY     staph abcess    [facility] Admission  age 47; three months in [place].   BIOPSY  07/26/2020   Procedure: BIOPSY;  Surgeon: Arta Silence, MD;  Location: [facility];  Service: Endoscopy;;   BREAST BIOPSY     CARDIAC CATHETERIZATION  11/02/2007   normal coronary arteries.   CARPAL TUNNEL RELEASE     Bilateral.   CATARACT EXTRACTION, BILATERAL     CHOLECYSTECTOMY     ESOPHAGEAL MANOMETRY N/A 09/14/2017   Procedure: ESOPHAGEAL MANOMETRY (EM);  Surgeon: Ronnette Juniper, MD;  Location: [facility];  Service: Gastroenterology;  Laterality: N/A;   ESOPHAGOGASTRODUODENOSCOPY (EGD) WITH PROPOFOL N/A 07/26/2020   Procedure: ESOPHAGOGASTRODUODENOSCOPY (EGD) WITH PROPOFOL;  Surgeon: Arta Silence, MD;  Location: [facility];  Service: Endoscopy;  Laterality: N/A;   EYE SURGERY     Cataracts B. Laser surgery x 7 for Diabetic Retinopathy   TONSILLECTOMY       reports that she has quit smoking. Her smoking use included cigarettes. She has never used smokeless tobacco. She reports that she does not drink alcohol and does not use drugs. family history includes Breast cancer in her daughter. She was adopted. Allergies  Allergen Reactions   Codeine Anaphylaxis   Contrast Media [Iodinated Contrast Media] Anaphylaxis   Nitrofurantoin Monohyd Macro Anaphylaxis   Betadine [Povidone Iodine] Itching   Folic Acid Itching   Gabapentin Other (See Comments)    Makes patient feel drunk   Iodine Hives   Lyrica [Pregabalin] Other (See Comments)    Makes patient feel drunk   Red Dye Itching   Ultram [Tramadol Hcl] Nausea And Vomiting   Current Outpatient Medications on File Prior to Visit  Medication Sig Dispense Refill   acetaminophen (TYLENOL) 325 MG tablet Take 650 mg by mouth every 6 (six) hours as needed (every 6 weeks before RA infusion).     allopurinol (ZYLOPRIM) 100 MG tablet Take 1 tablet (100 mg total) by mouth daily. Ov needed 90 tablet 3   amLODipine (NORVASC) 2.5 MG tablet Take 2.5 mg by mouth daily in the afternoon.     bacitracin 500 UNIT/GM ointment SMARTSIG:Sparingly Topical As Directed     Blood Glucose Monitoring Suppl (BLOOD GLUCOSE METER KIT AND SUPPLIES) KIT Dispense based on patient and insurance preference. Use up to four times daily as directed. (FOR ICD-9 250.00, 250.01). (Patient not taking: Reported on 12/08/2021) 1 each 11   cholecalciferol (VITAMIN D3) 25 MCG (1000 UNIT) tablet Take 1,000 Units by mouth daily.     diclofenac sodium (VOLTAREN) 1 % GEL Apply 2 g topically 4 (four) times daily. (Patient not taking: Reported on 12/08/2021) 100 g 3   diphenhydrAMINE (BENADRYL) 25 MG tablet Take 25 mg by mouth every 6 (six) hours as needed (every 6 weeks prior to RA infusion). (Patient not taking: Reported on 12/08/2021)     DULoxetine (CYMBALTA) 30 MG capsule TAKE 1 CAPSULE BY MOUTH EVERY DAY (Patient not taking: Reported on 12/08/2021) 90  capsule 3   DULoxetine (CYMBALTA) 60 MG capsule Take 60 mg by mouth daily in the afternoon.     escitalopram (LEXAPRO) 20 MG tablet TAKE 1 TABLET BY MOUTH EVERY DAY (Patient taking differently: Take 10 mg by mouth daily.) 90 tablet 1   famotidine (PEPCID) 40 MG tablet Take 40 mg by mouth at bedtime. (Patient not taking: Reported on 12/08/2021)     feeding supplement, ENSURE ENLIVE, (ENSURE ENLIVE) LIQD Take 237 mLs by mouth 3 (three) times daily between meals. (Patient not taking: Reported on 12/08/2021) 237 mL 12   furosemide (LASIX)  20 MG tablet Take 1-3 tablets (20-60 mg total) by mouth daily. (Patient not taking: Reported on 12/08/2021) 200 tablet 1   furosemide (LASIX) 20 MG tablet Take 20 mg by mouth daily in the afternoon.     glucose blood test strip Check sugar three times daily  Dx: DMII insulin dependent with retinopathy, neuropathy controlled (Patient not taking: Reported on 12/08/2021) 300 each 3   insulin aspart (NOVOLOG) 100 UNIT/ML injection Inject 25 Units into the skin 3 (three) times daily with meals. (Patient not taking: Reported on 12/08/2021) 10 mL 11   Insulin Syringes, Disposable, U-100 0.5 ML MISC 28 Units by Does not apply route 2 (two) times daily. (Patient not taking: Reported on 12/08/2021) 100 each 11   leflunomide (ARAVA) 20 MG tablet Take 20 mg by mouth daily.      LEVEMIR 100 UNIT/ML injection INJECT 60 UNITS AT BEDTIME AS DIRECTED (Patient not taking: Reported on 12/08/2021) 20 mL 1   linaclotide (LINZESS) 290 MCG CAPS capsule Take 290 mcg by mouth daily before breakfast.      Multiple Vitamin (MULTIVITAMIN WITH MINERALS) TABS tablet Take 1 tablet by mouth daily.     Needles & Syringes MISC 1 Syringe by Does not apply route 2 (two) times daily. (Patient not taking: Reported on 12/08/2021) 100 each 11   omeprazole (PRILOSEC) 20 MG capsule TAKE 1 CAPSULE BY MOUTH EVERY DAY (Patient taking differently: Take 20 mg by mouth daily.) 90 capsule 3   oxybutynin (DITROPAN XL) 15 MG 24 hr  tablet TAKE 1 TABLET BY MOUTH AT BEDTIME (Patient taking differently: Take 15 mg by mouth at bedtime.) 90 tablet 3   predniSONE (DELTASONE) 5 MG tablet Take 5 mg by mouth daily as needed. Only takes with RA Flare. (Patient not taking: Reported on 12/08/2021)     rosuvastatin (CRESTOR) 10 MG tablet Take 1 tablet (10 mg total) by mouth daily. 90 tablet 1   traZODone (DESYREL) 100 MG tablet TAKE 2 TABLETS BY MOUTH EVERY DAY AT BEDTIME (Patient taking differently: Take 200 mg by mouth at bedtime.) 180 tablet 1   ULTICARE INSULIN SYRINGE 31G X 5/16" 0.5 ML MISC USE AS DIRECTED TO INJECT INSULIN 2 TIMES DAILY (Patient not taking: Reported on 12/08/2021) 100 each 4   No current facility-administered medications on file prior to visit.        ROS:  All others reviewed and negative.  Objective        PE:  BP 122/72 (BP Location: Left Arm, Patient Position: Sitting, Cuff Size: Large)    Pulse 67    Temp 98.6 F (37 C) (Oral)    Ht _0  (1.575 m)    Wt 188 lb (85.3 kg)    SpO2 98%    BMI 34.39 kg/m                 Constitutional: Pt appears in NAD               HENT: Head: NCAT.                Right Ear: External ear normal.                 Left Ear: External ear normal.                Eyes: . Pupils are equal, round, and reactive to light. Conjunctivae and EOM are normal  Nose: without d/c or deformity               Neck: Neck supple. Gross normal ROM               Cardiovascular: Normal rate and regular rhythm.                 Pulmonary/Chest: Effort normal and breath sounds without rales or wheezing.                Abd:  Soft, NT, ND, + BS, no organomegaly               Neurological: Pt is alert. At baseline orientation, motor grossly intact               Skin: Skin is warm. No rashes, no other new lesions, LE edema - none               Psychiatric: Pt behavior is normal without agitation   Micro: none  Cardiac tracings I have personally interpreted today:  none  Pertinent  Radiological findings (summarize): none   Lab Results  Component Value Date   WBC 4.3 07/27/2020   HGB 10.7 (L) 07/27/2020   HCT 32.7 (L) 07/27/2020   PLT 156 07/27/2020   GLUCOSE 146 (H) 07/27/2020   CHOL 162 01/02/2019   TRIG 225 (H) 01/02/2019   HDL 37 (L) 01/02/2019   LDLCALC 80 01/02/2019   ALT 13 07/26/2020   AST 21 07/26/2020   NA 139 07/27/2020   K 3.5 07/27/2020   CL 102 07/27/2020   CREATININE 1.42 (H) 07/27/2020   BUN 15 07/27/2020   CO2 27 07/27/2020   TSH 0.872 07/10/2020   INR 0.9 06/23/2020   HGBA1C 6.0 (H) 07/26/2020   MICROALBUR 0.4 10/06/2015   Assessment/Plan:  Angel French is a 71 y.o. White or Caucasian [1] female with  has a past medical history of Allergy, Anxiety, Arthritis, Blood transfusion without reported diagnosis, Brachial plexus disorders, Cataract, Chronic kidney disease, Chronic pain syndrome, Chronic renal insufficiency, stage 3 (moderate) (HCC), Constipation, DDD (degenerative disc disease), lumbar, Depression, Diabetes mellitus, Diabetic peripheral neuropathy associated with type 2 diabetes mellitus ([place]), Diabetic retinopathy ([place]), Diabetic retinopathy associated with type 2 diabetes mellitus ([place]), Fatty liver, Fibromyalgia, Food allergy, GERD (gastroesophageal reflux disease), Hypercholesteremia, Hyperlipidemia, Hypertension, IBS (irritable bowel syndrome), Leg edema, Neuromuscular disorder ([place]), OSA (obstructive sleep apnea), Osteoarthritis, Rheumatic fever, Rheumatoid arthritis ([place]), Stomach ulcer, Swallowing difficulty, TIA (transient ischemic attack), and Ulcer.  Vitamin D deficiency Last vitamin D Lab Results  Component Value Date   VD25OH 64.9 01/02/2019   Stable, cont oral replacement   Essential hypertension, benign BP Readings from Last 3 Encounters:  12/31/21 122/72  12/08/21 (!) 134/58  09/16/20 138/68   Stable, pt to continue medical treatment amlodipine   Pure hypercholesterolemia Lab Results  Component Value  Date   LDLCALC 80 01/02/2019   Mild uncontrolled, pt to continue current statin crestor and f/u lipids today, goal ldl < 70   Type 2 diabetes mellitus with diabetic polyneuropathy, with long-term current use of insulin (HCC) Lab Results  Component Value Date   HGBA1C 6.0 (H) 07/26/2020   Stable, pt to continue current medical treatment novolog, levemir  Followup: Return in about 6 months (around 07/03/2022).  Cathlean Cower, MD 12/31/2021 2:02 PM [facility]

## 2021-12-31 NOTE — Assessment & Plan Note (Signed)
BP Readings from Last 3 Encounters:  ?12/31/21 122/72  ?12/08/21 (!) 134/58  ?09/16/20 138/68  ? ?Stable, pt to continue medical treatment amlodipine ? ?

## 2021-12-31 NOTE — Assessment & Plan Note (Signed)
Lab Results  ?Component Value Date  ? [place] 80 01/02/2019  ? ?Mild uncontrolled, pt to continue current statin crestor and f/u lipids today, goal ldl < 70 ? ?

## 2021-12-31 NOTE — Addendum Note (Signed)
Addended by: Marijean Heath R on: 12/31/2021 02:05 PM ? ? Modules accepted: Orders ? ?

## 2021-12-31 NOTE — Assessment & Plan Note (Signed)
Last vitamin D ?Lab Results  ?Component Value Date  ? VD25OH 64.9 01/02/2019  ? ?Stable, cont oral replacement ? ?

## 2021-12-31 NOTE — Patient Instructions (Addendum)
You had the pneumovax pneumonia shot today ? ?Please continue all other medications as before, and refills have been done if requested. ? ?Please have the pharmacy call with any other refills you may need. ? ?Please continue your efforts at being more active, low cholesterol diet, and weight control. ? ?You are otherwise up to date with prevention measures today. ? ?Please keep your appointments with your specialists as you may have planned ? ?Please go to the LAB at the blood drawing area for the tests to be done ? ?You will be contacted by phone if any changes need to be made immediately.  Otherwise, you will receive a letter about your results with an explanation, but please check with MyChart first. ? ?Please remember to sign up for MyChart if you have not done so, as this will be important to you in the future with finding out test results, communicating by private email, and scheduling acute appointments online when needed. ? ?Please make an Appointment to return in 6 months, or sooner if needed, also with Lab Appointment for testing done 3-5 days before at the [facility] (so this is for TWO appointments - please see the scheduling desk as you leave) ? ?Due to the ongoing Covid 19 pandemic, our lab now requires an appointment for any labs done at our office.  If you need labs done and do not have an appointment, please call our office ahead of time to schedule before presenting to the lab for your testing. ? ? ?

## 2022-01-01 ENCOUNTER — Other Ambulatory Visit: Payer: Self-pay | Admitting: Internal Medicine

## 2022-01-01 MED ORDER — ROSUVASTATIN CALCIUM 20 MG PO TABS: 20.0000 mg | ORAL_TABLET | Freq: Every day | ORAL | 3 refills | Status: DC

## 2022-01-04 ENCOUNTER — Other Ambulatory Visit: Payer: Self-pay | Admitting: Internal Medicine

## 2022-01-04 DIAGNOSIS — E559 Vitamin D deficiency, unspecified: Secondary | ICD-10-CM

## 2022-01-04 DIAGNOSIS — E1142 Type 2 diabetes mellitus with diabetic polyneuropathy: Secondary | ICD-10-CM

## 2022-01-12 DIAGNOSIS — F320 Major depressive disorder, single episode, mild: Secondary | ICD-10-CM | POA: Insufficient documentation

## 2022-03-08 ENCOUNTER — Telehealth: Payer: Self-pay

## 2022-03-08 ENCOUNTER — Ambulatory Visit: Payer: Medicare Other

## 2022-03-08 NOTE — Telephone Encounter (Signed)
Called x 2 automatic VM ,unable to leave a message.  Patient may reschedule for the next available appointment. ? ? ?L.Morris Markham,LPN ?

## 2022-05-31 ENCOUNTER — Ambulatory Visit (INDEPENDENT_AMBULATORY_CARE_PROVIDER_SITE_OTHER): Payer: Medicare Other | Admitting: Podiatry

## 2022-05-31 VITALS — BP 134/63 | HR 58 | Temp 98.1°F

## 2022-05-31 DIAGNOSIS — M79674 Pain in right toe(s): Secondary | ICD-10-CM | POA: Diagnosis not present

## 2022-05-31 DIAGNOSIS — M2011 Hallux valgus (acquired), right foot: Secondary | ICD-10-CM | POA: Diagnosis not present

## 2022-05-31 DIAGNOSIS — L853 Xerosis cutis: Secondary | ICD-10-CM | POA: Diagnosis not present

## 2022-05-31 DIAGNOSIS — E1142 Type 2 diabetes mellitus with diabetic polyneuropathy: Secondary | ICD-10-CM | POA: Diagnosis not present

## 2022-05-31 DIAGNOSIS — B351 Tinea unguium: Secondary | ICD-10-CM | POA: Diagnosis not present

## 2022-05-31 DIAGNOSIS — M2012 Hallux valgus (acquired), left foot: Secondary | ICD-10-CM

## 2022-05-31 DIAGNOSIS — M79675 Pain in left toe(s): Secondary | ICD-10-CM | POA: Diagnosis not present

## 2022-05-31 DIAGNOSIS — Z794 Long term (current) use of insulin: Secondary | ICD-10-CM

## 2022-06-02 ENCOUNTER — Encounter: Payer: Self-pay | Admitting: Family Medicine

## 2022-06-02 ENCOUNTER — Ambulatory Visit (INDEPENDENT_AMBULATORY_CARE_PROVIDER_SITE_OTHER): Payer: Medicare Other | Admitting: Family Medicine

## 2022-06-02 VITALS — BP 132/74 | HR 56 | Temp 97.2°F | Ht 62.0 in | Wt 181.0 lb

## 2022-06-02 DIAGNOSIS — L304 Erythema intertrigo: Secondary | ICD-10-CM | POA: Diagnosis not present

## 2022-06-02 DIAGNOSIS — N3001 Acute cystitis with hematuria: Secondary | ICD-10-CM

## 2022-06-02 DIAGNOSIS — R300 Dysuria: Secondary | ICD-10-CM | POA: Diagnosis not present

## 2022-06-02 LAB — POCT URINALYSIS DIPSTICK
Bilirubin, UA: NEGATIVE
Glucose, UA: NEGATIVE
Ketones, UA: NEGATIVE
Nitrite, UA: POSITIVE
Protein, UA: POSITIVE — AB
Spec Grav, UA: 1.015 (ref 1.010–1.025)
Urobilinogen, UA: 0.2 E.U./dL
pH, UA: 6 (ref 5.0–8.0)

## 2022-06-02 MED ORDER — CLOTRIMAZOLE-BETAMETHASONE 1-0.05 % EX CREA: 1.0000 | TOPICAL_CREAM | Freq: Two times a day (BID) | CUTANEOUS | 0 refills | Status: AC

## 2022-06-02 MED ORDER — SULFAMETHOXAZOLE-TRIMETHOPRIM 800-160 MG PO TABS: 1.0000 | ORAL_TABLET | Freq: Two times a day (BID) | ORAL | 0 refills | Status: DC

## 2022-06-02 NOTE — Patient Instructions (Signed)
Take the oral antibiotic as prescribed for your UTI.   Drink plenty of water.   Use the cream for the red areas.  Keep the areas clean and dry.   Follow up if worsening or not improving in the next few days.    Intertrigo Intertrigo is skin irritation or inflammation (dermatitis) that occurs when folds of skin rub together. The irritation can cause a rash and make skin raw and itchy. This condition most commonly occurs in the skin folds of these areas: Toes. Armpits. Groin. Under the belly. Under the breasts. Buttocks. Intertrigo is not passed from person to person (is not contagious). What are the causes? This condition is caused by heat, moisture, rubbing (friction), and not enough air circulation. The condition can be made worse by: Sweat. Bacteria. A fungus, such as yeast. What increases the risk? This condition is more likely to occur if you have moisture in your skin folds. You are more likely to develop this condition if you: Have diabetes. Are overweight. Are not able to move around or are not active. Live in a warm and moist climate. Wear splints, braces, or other medical devices. Are not able to control your bowels or bladder (have incontinence). What are the signs or symptoms? Symptoms of this condition include: A pink or red skin rash in the skin fold or near the skin fold. Raw or scaly skin. Itchiness. A burning feeling. Bleeding. Leaking fluid. A bad smell. How is this diagnosed? This condition is diagnosed with a medical history and physical exam. You may also have a skin swab to test for bacteria or a fungus. How is this treated? This condition may be treated by: Cleaning and drying your skin. Taking an antibiotic medicine or using an antibiotic skin cream for a bacterial infection. Using an antifungal cream on your skin or taking pills for an infection that was caused by a fungus, such as yeast. Using a steroid ointment to relieve itchiness and  irritation. Separating the skin fold with a clean cotton cloth to absorb moisture and allow air to flow into the area. Follow these instructions at home: Keep the affected area clean and dry. Do not scratch your skin. Stay in a cool environment as much as possible. Use an air conditioner or fan, if available. Apply over-the-counter and prescription medicines only as told by your health care provider. If you were prescribed an antibiotic medicine, use it as told by your health care provider. Do not stop using the antibiotic even if your condition improves. Keep all follow-up visits as told by your health care provider. This is important. How is this prevented?  Maintain a healthy weight. Take care of your feet, especially if you have diabetes. Foot care includes: Wearing shoes that fit well. Keeping your feet dry. Wearing clean, breathable socks. Protect the skin around your groin and buttocks, especially if you have incontinence. Skin protection includes: Following a regular cleaning routine. Using skin protectant creams, powders, or ointments. Changing protection pads frequently. Do not wear tight clothes. Wear clothes that are loose, absorbent, and made of cotton. Wear a bra that gives good support, if needed. Shower and dry yourself well after activity or exercise. Use a hair dryer on a cool setting to dry between skin folds, especially after you bathe. If you have diabetes, keep your blood sugar under control. Contact a health care provider if: Your symptoms do not improve with treatment. Your symptoms get worse or they spread. You notice increased redness and warmth.  You have a fever. Summary Intertrigo is skin irritation or inflammation (dermatitis) that occurs when folds of skin rub together. This condition is caused by heat, moisture, rubbing (friction), and not enough air circulation. This condition may be treated by cleaning and drying your skin and with medicines. Apply  over-the-counter and prescription medicines only as told by your health care provider. Keep all follow-up visits as told by your health care provider. This is important. This information is not intended to replace advice given to you by your health care provider. Make sure you discuss any questions you have with your health care provider. Document Revised: 08/03/2021 Document Reviewed: 08/03/2021 Elsevier Patient Education  [place].

## 2022-06-02 NOTE — Progress Notes (Signed)
Subjective:  Angel French is a 71 y.o. female who complains of possible urinary tract infection.  She has had symptoms for  2 weeks   Symptoms include  urinary frequency, urgency, malodorous urine, suprapubic pressure and low back pain . Patient denies  fever, chills, N/V.  Last UTI was 4-5 years ago.   Using nothing for current symptoms.    States she was recently seen at nephrologist and her urine showed a UTI.   States she has a new rash that is itching, burning and stinging in the folds or her groin and under her abdomen in the folds. It not using anything for this.   Patient does not have a history of recurrent UTI. Patient does not have a history of pyelonephritis.  No other aggravating or relieving factors.  No other c/o.  Past Medical History:  Diagnosis Date   Allergy    generic allergy pill; Spring and Fall only.   Anxiety    Arthritis    DDD lumbar, R hip OA.  s/p ortho consult in past.   Blood transfusion without reported diagnosis    Mountain climbing accident in [place].   Brachial plexus disorders    Cataract    B retractions.   Chronic kidney disease    stage 3 per pt.    Chronic pain syndrome    Chronic renal insufficiency, stage 3 (moderate) (HCC)    Constipation    DDD (degenerative disc disease), lumbar    Depression    Diabetes mellitus    Diabetic peripheral neuropathy associated with type 2 diabetes mellitus (HCC)    Diabetic retinopathy ([place])    Diabetic retinopathy associated with type 2 diabetes mellitus ([place])    s/p laser treatment multiple.  Unable to drive.   Fatty liver    Fibromyalgia    Food allergy    GERD (gastroesophageal reflux disease)    Hypercholesteremia    Hyperlipidemia    Hypertension    controlled, off meds    IBS (irritable bowel syndrome)    Leg edema    Neuromuscular disorder (HCC)    OSA (obstructive sleep apnea)    Osteoarthritis    Rheumatic fever    Rheumatoid arthritis (HCC)    Stomach ulcer    Swallowing difficulty     TIA (transient ischemic attack)    Ulcer    Peptic ulcer H. Pylori + s/p treatment.  Upper GI diagnosed.[name] Prilosec PRN .    ROS as in subjective  Reviewed allergies, medications, past medical, surgical, and social history.   Past Medical History:  Diagnosis Date   Allergy    generic allergy pill; Spring and Fall only.   Anxiety    Arthritis    DDD lumbar, R hip OA.  s/p ortho consult in past.   Blood transfusion without reported diagnosis    Mountain climbing accident in [place].   Brachial plexus disorders    Cataract    B retractions.   Chronic kidney disease    stage 3 per pt.    Chronic pain syndrome    Chronic renal insufficiency, stage 3 (moderate) (HCC)    Constipation    DDD (degenerative disc disease), lumbar    Depression    Diabetes mellitus    Diabetic peripheral neuropathy associated with type 2 diabetes mellitus (HCC)    Diabetic retinopathy ([place])    Diabetic retinopathy associated with type 2 diabetes mellitus ([place])    s/p laser treatment multiple.  Unable to  drive.   Fatty liver    Fibromyalgia    Food allergy    GERD (gastroesophageal reflux disease)    Hypercholesteremia    Hyperlipidemia    Hypertension    controlled, off meds    IBS (irritable bowel syndrome)    Leg edema    Neuromuscular disorder (HCC)    OSA (obstructive sleep apnea)    Osteoarthritis    Rheumatic fever    Rheumatoid arthritis (HCC)    Stomach ulcer    Swallowing difficulty    TIA (transient ischemic attack)    Ulcer    Peptic ulcer H. Pylori + s/p treatment.  Upper GI diagnosed.[name] Prilosec PRN .      Objective: Vitals:   06/02/22 0941  BP: 132/74  Pulse: (!) 56  Temp: (!) 97.2 F (36.2 C)  SpO2: 99%    General appearance: alert, no distress, WD/WN, female Abdomen: +bs, soft, mildly TTP over suprapubic region without rebound or referred pain, non tender otherwise, non distended, no palpable masses . Back: no CVA tenderness GU: deferred  Skin:  beefy red, pruritic rash in skin folds of abdomen and bilateral groin area without oozing or bleeding.      Laboratory:  Urine dipstick: trace for hemoglobin, 3+ for leukocyte esterase, and trace for nitrites.       Assessment: Acute cystitis with hematuria - Plan: sulfamethoxazole-trimethoprim (BACTRIM DS) 800-160 MG tablet, Urine Culture, Urine Culture  Dysuria - Plan: POCT urinalysis dipstick, Urine Culture, Urine Culture  Intertrigo - Plan: clotrimazole-betamethasone (LOTRISONE) cream   Plan: Discussed symptoms, diagnosis, possible complications, and usual course of illness.  Bactrim prescribed  Advised increased water intake, can use OTC Tylenol for pain.     Urine culture sent.   Lotrisone cream prescribed. No sign of secondary bacterial infection currently but she will let us know if she is worsening. Counseling on treatment and prevention of intertrigo   Call or return if worse or not improving.

## 2022-06-03 ENCOUNTER — Ambulatory Visit (INDEPENDENT_AMBULATORY_CARE_PROVIDER_SITE_OTHER): Payer: Medicare Other

## 2022-06-03 ENCOUNTER — Ambulatory Visit: Payer: Medicare Other

## 2022-06-03 DIAGNOSIS — Z0000 Encounter for general adult medical examination without abnormal findings: Secondary | ICD-10-CM

## 2022-06-03 NOTE — Patient Instructions (Signed)

## 2022-06-03 NOTE — Progress Notes (Signed)
I connected with  Angel French on 06/03/22 by a audio enabled telemedicine application and verified that I am speaking with the correct person using two identifiers.  Patient Location: Home  Provider Location: Office/Clinic  I discussed the limitations of evaluation and management by telemedicine. The patient expressed understanding and agreed to proceed.      Subjective:   Angel French is a 71 y.o. female who presents for Medicare Annual (Subsequent) preventive examination.  Review of Systems    Defer to PCP       Objective:    There were no vitals filed for this visit. There is no height or weight on file to calculate BMI.     08/28/2020    9:17 AM 07/25/2020    9:26 AM 06/23/2020    6:45 AM 02/22/2020   12:07 AM 08/05/2017    9:14 PM 07/22/2016   11:09 AM 05/31/2015    5:22 PM  Advanced Directives  Does Patient Have a Medical Advance Directive? Yes No No No No Yes No  Does patient want to make changes to medical advance directive? No - Patient declined        Would patient like information on creating a medical advance directive?  No - Patient declined No - Patient declined No - Patient declined   No - patient declined information    Current Medications (verified) Outpatient Encounter Medications as of 06/03/2022  Medication Sig   acetaminophen (TYLENOL) 325 MG tablet Take 650 mg by mouth every 6 (six) hours as needed (every 6 weeks before RA infusion).   allopurinol (ZYLOPRIM) 100 MG tablet Take 1 tablet (100 mg total) by mouth daily. Ov needed   allopurinol (ZYLOPRIM) 100 MG tablet Take 2 tablets by mouth daily.   amLODipine (NORVASC) 2.5 MG tablet Take 2.5 mg by mouth daily in the afternoon.   amLODipine (NORVASC) 5 MG tablet TAKE 1 TABLET BY MOUTH EVERY DAY Oral for 90   bacitracin 500 UNIT/GM ointment SMARTSIG:Sparingly Topical As Directed   cholecalciferol (VITAMIN D3) 25 MCG (1000 UNIT) tablet Take 1,000 Units by mouth daily.   clotrimazole-betamethasone  (LOTRISONE) cream Apply 1 Application topically 2 (two) times daily.   diclofenac sodium (VOLTAREN) 1 % GEL Apply 2 g topically 4 (four) times daily.   diclofenac Sodium (VOLTAREN) 1 % GEL APPLY AS DIRECTED FOUR TIMES DAILY AS NEEDED   diphenhydrAMINE (BENADRYL) 25 MG tablet Take 25 mg by mouth every 6 (six) hours as needed (every 6 weeks prior to RA infusion).   furosemide (LASIX) 20 MG tablet Take 1-3 tablets (20-60 mg total) by mouth daily.   furosemide (LASIX) 20 MG tablet Take 20 mg by mouth daily in the afternoon.   HYDROcodone-acetaminophen (NORCO) 10-325 MG tablet Take 1 tablet by mouth every 6 (six) hours as needed.   inFLIXimab (REMICADE) 100 MG injection 10 mg/kg Intravenous 8 weeks   leflunomide (ARAVA) 20 MG tablet 1 tablet Orally Once a day for 30 day(s)   linaclotide (LINZESS) 290 MCG CAPS capsule Take 290 mcg by mouth daily before breakfast.    Multiple Vitamin (MULTIVITAMIN ADULT) TABS 1 tablet Orally Once a day for 30 day(s)   Multiple Vitamin (MULTIVITAMIN WITH MINERALS) TABS tablet Take 1 tablet by mouth daily.   mupirocin ointment (BACTROBAN) 2 % APPLY TO AFFECTED AREA 3 TIMES A DAY   Needles & Syringes MISC 1 Syringe by Does not apply route 2 (two) times daily. (Patient not taking: Reported on 12/08/2021)   nystatin cream (  MYCOSTATIN) Apply topically 2 (two) times daily.   omeprazole (PRILOSEC) 20 MG capsule TAKE 1 CAPSULE BY MOUTH EVERY DAY (Patient taking differently: Take 20 mg by mouth daily.)   oxybutynin (DITROPAN XL) 15 MG 24 hr tablet TAKE 1 TABLET BY MOUTH AT BEDTIME (Patient taking differently: Take 15 mg by mouth at bedtime.)   predniSONE (DELTASONE) 5 MG tablet Take 5 mg by mouth daily as needed. Only takes with RA Flare. (Patient not taking: Reported on 12/08/2021)   rosuvastatin (CRESTOR) 20 MG tablet Take 1 tablet (20 mg total) by mouth daily.   simvastatin (ZOCOR) 20 MG tablet 1 tablet in the evening Orally Once a day   sulfamethoxazole-trimethoprim (BACTRIM DS)  800-160 MG tablet Take 1 tablet by mouth 2 (two) times daily.   traZODone (DESYREL) 100 MG tablet TAKE 2 TABLETS BY MOUTH EVERY DAY AT BEDTIME (Patient taking differently: Take 200 mg by mouth at bedtime.)   zaleplon (SONATA) 5 MG capsule    Zoster Vaccine Adjuvanted [facility] injection as directed Intramuscular once then again as directed for 30 days   No facility-administered encounter medications on file as of 06/03/2022.    Allergies (verified) Codeine, Iodinated contrast media, Nitrofurantoin monohyd macro, Betadine [povidone iodine], Folic acid, Gabapentin, Iodine, Lyrica [pregabalin], Red dye, and Ultram [tramadol hcl]   History: Past Medical History:  Diagnosis Date   Allergy    generic allergy pill; Spring and Fall only.   Anxiety    Arthritis    DDD lumbar, R hip OA.  s/p ortho consult in past.   Blood transfusion without reported diagnosis    Mountain climbing accident in [place].   Brachial plexus disorders    Cataract    B retractions.   Chronic kidney disease    stage 3 per pt.    Chronic pain syndrome    Chronic renal insufficiency, stage 3 (moderate) (HCC)    Constipation    DDD (degenerative disc disease), lumbar    Depression    Diabetes mellitus    Diabetic peripheral neuropathy associated with type 2 diabetes mellitus (HCC)    Diabetic retinopathy ([place])    Diabetic retinopathy associated with type 2 diabetes mellitus ([place])    s/p laser treatment multiple.  Unable to drive.   Fatty liver    Fibromyalgia    Food allergy    GERD (gastroesophageal reflux disease)    Hypercholesteremia    Hyperlipidemia    Hypertension    controlled, off meds    IBS (irritable bowel syndrome)    Leg edema    Neuromuscular disorder (HCC)    OSA (obstructive sleep apnea)    Osteoarthritis    Rheumatic fever    Rheumatoid arthritis (HCC)    Stomach ulcer    Swallowing difficulty    TIA (transient ischemic attack)    Ulcer    Peptic ulcer H. Pylori + s/p treatment.   Upper GI diagnosed.[name] Prilosec PRN .   Past Surgical History:  Procedure Laterality Date    2 SPINAL INJECTIONS      ABDOMINAL HYSTERECTOMY  11/02/1979   DUB; cervical dysplasia; ovaries intact.   ABDOMINAL SURGERY     staph abcess    [facility] Admission     age 37; three months in [place].   BIOPSY  07/26/2020   Procedure: BIOPSY;  Surgeon: Arta Silence, MD;  Location: [facility];  Service: Endoscopy;;   BREAST BIOPSY     CARDIAC CATHETERIZATION  11/02/2007   normal coronary  arteries.   CARPAL TUNNEL RELEASE     Bilateral.   CATARACT EXTRACTION, BILATERAL     CHOLECYSTECTOMY     ESOPHAGEAL MANOMETRY N/A 09/14/2017   Procedure: ESOPHAGEAL MANOMETRY (EM);  Surgeon: Ronnette Juniper, MD;  Location: [facility];  Service: Gastroenterology;  Laterality: N/A;   ESOPHAGOGASTRODUODENOSCOPY (EGD) WITH PROPOFOL N/A 07/26/2020   Procedure: ESOPHAGOGASTRODUODENOSCOPY (EGD) WITH PROPOFOL;  Surgeon: Arta Silence, MD;  Location: [facility];  Service: Endoscopy;  Laterality: N/A;   EYE SURGERY     Cataracts B. Laser surgery x 7 for Diabetic Retinopathy   TONSILLECTOMY     Family History  Adopted: Yes  Problem Relation Age of Onset   Breast cancer Daughter    Social History   Socioeconomic History   Marital status: Married    Spouse name: Advice worker   Number of children: 2   Years of education: college   Highest education level: Not on file  Occupational History   Occupation: retired    Comment: retretied  Tobacco Use   Smoking status: Former    Years: 22.00    Types: Cigarettes   Smokeless tobacco: Never   Tobacco comments:    Quit 1987  Substance and Sexual Activity   Alcohol use: No    Alcohol/week: 0.0 standard drinks of alcohol   Drug use: No   Sexual activity: Yes    Birth control/protection: Surgical, Post-menopausal    Comment: widow  Other Topics Concern   Not on file  Social History Narrative   Marital status: widowed since 2009; dating x 6  years.  Happy; no abuse.      Children: 2 children (39 daughter, 72 son estranged); 2 grandchildren.      Lives: with boyfriend, daughter, granddaughter, friend of daughter.  Lives in pt house.      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  Diabetic retinopathy; unable to drive.      Tobacco:  Smoked x 20 years; quit 20 years.      Alcohol:  On special occasions; once per week on average.       Drugs:  None since college.      Exercise:  Walking several times per week; walks the dog.   Education college   Caffeine one cup daily.   Right handed      Advanced Directives: none; FULL CODE.  DNR/DNI.  HCPOA: [place]?           Social Determinants of Health   Financial Resource Strain: Not on file  Food Insecurity: Not on file  Transportation Needs: Not on file  Physical Activity: Not on file  Stress: Not on file  Social Connections: Not on file    Tobacco Counseling Counseling given: Not Answered Tobacco comments: Quit 1987   Clinical Intake:  Pre-visit preparation completed: Yes  Pain : No/denies pain     Nutritional Status: BMI > 30  Obese Nutritional Risks: None Diabetes: Yes CBG done?: No Did pt. bring in CBG monitor from home?: No  How often do you need to have someone help you when you read instructions, pamphlets, or other written materials from your doctor or pharmacy?: 1 - Never  Diabetic? Yes  Interpreter Needed?: No  Nutrition Risk Assessment:  Has the patient had any N/V/D within the last 2 months?  No  Does the patient have any non-healing wounds?  No  Has the patient had any unintentional weight loss or weight gain?  No   Diabetes:  Is the patient diabetic?  Yes  If diabetic, was a CBG obtained today?  No  Did the patient bring in their glucometer from home?  No  How often do you monitor your CBG's? Does not monitor, numbers have drastically improved since significant weight loss.   Financial Strains and Diabetes  Management:  Are you having any financial strains with the device, your supplies or your medication? No .  Does the patient want to be seen by Chronic Care Management for management of their diabetes?  No  Would the patient like to be referred to a Nutritionist or for Diabetic Management?  No   Diabetic Exams:  Diabetic Eye Exam: Overdue for diabetic eye exam. Pt has been advised about the importance in completing this exam. Patient advised to call and schedule an eye exam. Diabetic Foot Exam: Overdue, Pt has been advised about the importance in completing this exam. Pt is scheduled for diabetic foot exam on not scheduled at this time.      Activities of Daily Living    06/03/2022    3:58 PM  In your present state of health, do you have any difficulty performing the following activities:  Hearing? 0  Vision? 0  Difficulty concentrating or making decisions? 0  Walking or climbing stairs? 0  Dressing or bathing? 0  Doing errands, shopping? 0    Patient Care Team: Biagio Borg, MD as PCP - General (Internal Medicine) O'Neal, Cassie Freer, MD as PCP - Cardiology (Cardiology)  Indicate any recent Medical Services you may have received from other than [facility] providers in the past year (date may be approximate).     Assessment:   This is a routine wellness examination for Parminder.  Hearing/Vision screen No results found.  Dietary issues and exercise activities discussed: Current Exercise Habits: The patient does not participate in regular exercise at present, Exercise limited by: orthopedic condition(s);neurologic condition(s)  Depression Screen    06/02/2022    9:53 AM 12/31/2021    1:40 PM 12/31/2021    1:18 PM 08/28/2020    9:15 AM 09/05/2018    8:34 AM 08/17/2018   12:27 PM 08/03/2018    1:43 PM  PHQ 2/9 Scores  PHQ - 2 Score 0 0 0 0 0 0 0  PHQ- 9 Score 3    0 0 0    Fall Risk    06/03/2022    3:57 PM 06/02/2022    9:53 AM 12/31/2021    1:40 PM 12/31/2021    1:18 PM 08/28/2020     9:14 AM  Fall Risk   Falls in the past year? 1 1 0 0 1  Number falls in past yr: 0 0 0 0 0  Comment     07/02/2020  Injury with Fall? 0 0 0 0 1  Comment     injured knee  Risk for fall due to : History of fall(s) No Fall Risks  No Fall Risks   Follow up Falls evaluation completed Falls evaluation completed  Falls evaluation completed     [place]:  Any stairs in or around the home? Yes  If so, are there any without handrails? Yes  Home free of loose throw rugs in walkways, pet beds, electrical cords, etc? No  Adequate lighting in your home to reduce risk of falls?  Has diabetic retinopathy so never  enough light   ASSISTIVE DEVICES UTILIZED TO PREVENT FALLS:  Life alert? No  Use of  a cane, walker or w/c? Yes  Grab bars in the bathroom? Yes  Shower chair or bench in shower? Yes  Elevated toilet seat or a handicapped toilet? Yes   TIMED UP AND GO:  Was the test performed? No .  Length of time to ambulate Not able to determine  Gait slow and steady with assistive device  Cognitive Function:  Patient was able to determine the year, month, tell accurate time without looking at watch, count backwards from 20-1 and recite the months of year backwards from December to January. Patient was also able to remember and recite the address given to her.       Immunizations Immunization History  Administered Date(s) Administered   Fluad Quad(high Dose 65+) 07/08/2021   Hepatitis A, Adult 10/06/2015, 08/12/2016   Influenza, High Dose Seasonal PF 09/15/2018   Influenza,inj,Quad PF,6+ Mos 07/16/2013, 07/10/2014, 10/15/2015, 08/12/2016, 06/28/2017   Influenza-Unspecified 07/16/2013, 07/10/2014, 10/15/2015, 08/12/2016, 06/28/2017   PFIZER Comirnaty(Gray Top)Covid-19 Tri-Sucrose Vaccine 01/10/2020, 02/06/2020, 08/07/2020, 03/05/2021   PFIZER(Purple Top)SARS-COV-2 Vaccination 01/10/2020, 02/06/2020   Pfizer Covid-19 Vaccine Bivalent Booster 55yr & up  10/13/2021   Pneumococcal Conjugate-13 03/23/2017   Pneumococcal Polysaccharide-23 07/10/2014, 10/19/2019, 12/31/2021   Td 01/12/2022   Tdap 10/06/2015   Zoster Recombinat (Shingrix) 02/24/2021    TDAP status: Up to date  Flu Vaccine status: Up to date  Pneumococcal vaccine status: Up to date  Covid-19 vaccine status: Completed vaccines  Qualifies for Shingles Vaccine? Yes   Zostavax completed No   Shingrix Completed?: No.    Education has been provided regarding the importance of this vaccine. Patient has been advised to call insurance company to determine out of pocket expense if they have not yet received this vaccine. Advised may also receive vaccine at local pharmacy or Health Dept. Verbalized acceptance and understanding.  Screening Tests Health Maintenance  Topic Date Due   INFLUENZA VACCINE  06/01/2022   OPHTHALMOLOGY EXAM  08/02/2022 (Originally 09/13/2018)   Zoster Vaccines- Shingrix (2 of 2) 08/02/2022 (Originally 04/21/2021)   HEMOGLOBIN A1C  07/03/2022   MAMMOGRAM  09/07/2022   Diabetic kidney evaluation - GFR measurement  01/01/2023   Diabetic kidney evaluation - Urine ACR  01/01/2023   FOOT EXAM  01/01/2023   TETANUS/TDAP  01/13/2032   Pneumonia Vaccine 70 Years old  Completed   DEXA SCAN  Completed   COVID-19 Vaccine  Completed   Hepatitis C Screening  Completed   HPV VACCINES  Aged Out    Health Maintenance  Health Maintenance Due  Topic Date Due   INFLUENZA VACCINE  06/01/2022    Colorectal cancer screening: Type of screening: Colonoscopy. Completed Not able to determine. Repeat every 10 years  Mammogram status: Completed Yes. Repeat every year  Bone Density status: Completed Yes. Results reflect: Bone density results: NORMAL. Repeat every 5 years.  Lung Cancer Screening: (Low Dose CT Chest recommended if Age 70-80years, 30 pack-year currently smoking OR have quit w/in 15years.) does not qualify.   Lung Cancer Screening Referral:   Additional  Screening:  Hepatitis C Screening: does qualify; Completed Yes  Vision Screening: Recommended annual ophthalmology exams for early detection of glaucoma and other disorders of the eye. Is the patient up to date with their annual eye exam?   No Who is the provider or what is the name of the office in which the patient attends annual eye exams? Switching doctors If pt is not established with a provider, would they like to be referred to a provider to  establish care? No .   Dental Screening: Recommended annual dental exams for proper oral hygiene  Community Resource Referral / Chronic Care Management: CRR required this visit?  No   CCM required this visit?  No      Plan:     I have personally reviewed and noted the following in the patient's chart:   Medical and social history Use of alcohol, tobacco or illicit drugs  Current medications and supplements including opioid prescriptions.  Functional ability and status Nutritional status Physical activity Advanced directives List of other physicians Hospitalizations, surgeries, and ER visits in previous 12 months Vitals Screenings to include cognitive, depression, and falls Referrals and appointments  In addition, I have reviewed and discussed with patient certain preventive protocols, quality metrics, and best practice recommendations. A written personalized care plan for preventive services as well as general preventive health recommendations were provided to patient.     Henrene Dodge, RN   06/03/2022   Nurse Notes: Non face to face 30 minutes   Ms. Ziesmer ,  Thank you for taking time to come for your Medicare Wellness Visit. I appreciate your ongoing commitment to your health goals. Please review the following plan we discussed and let me know if I can assist you in the future.    This is a list of the screening recommended for you and due dates:  Health Maintenance  Topic Date Due   Flu Shot  06/01/2022   Eye exam  for diabetics  08/02/2022*   Zoster (Shingles) Vaccine (2 of 2) 08/02/2022*   Hemoglobin A1C  07/03/2022   Mammogram  09/07/2022   Yearly kidney function blood test for diabetes  01/01/2023   Yearly kidney health urinalysis for diabetes  01/01/2023   Complete foot exam   01/01/2023   Tetanus Vaccine  01/13/2032   Pneumonia Vaccine  Completed   DEXA scan (bone density measurement)  Completed   COVID-19 Vaccine  Completed   Hepatitis C Screening: USPSTF Recommendation to screen - Ages 40-79 yo.  Completed   HPV Vaccine  Aged Out  *Topic was postponed. The date shown is not the original due date.

## 2022-06-04 ENCOUNTER — Encounter: Payer: Self-pay | Admitting: Podiatry

## 2022-06-04 LAB — URINE CULTURE

## 2022-06-04 NOTE — Progress Notes (Signed)
ANNUAL DIABETIC FOOT EXAM  Subjective: Angel French presents today for annual diabetic foot examination. She presents with her daughter and husband on today's visit.  Patient denies dx of diabetes, but per chart review she was on levimir and novolog per Dr. Gwynn Burly last visit.  Patient denies any h/o foot wounds.  Patient has been diagnosed with neuropathy.  She relates h/o left foot fracture and states her foot feels like it's "in a box".  Patient does not monitor blood glucose daily.  Risk factors: diabetes, diabetic neuropathy, HTN, CKD, hyperlipidemia, hypercholesterolemia, h/o tobacco use in remission, RA.  Biagio Borg, MD is patient's PCP. Last visit was December 31, 2021.  Past Medical History:  Diagnosis Date   Allergy    generic allergy pill; Spring and Fall only.   Anxiety    Arthritis    DDD lumbar, R hip OA.  s/p ortho consult in past.   Blood transfusion without reported diagnosis    Mountain climbing accident in [place].   Brachial plexus disorders    Cataract    B retractions.   Chronic kidney disease    stage 3 per pt.    Chronic pain syndrome    Chronic renal insufficiency, stage 3 (moderate) (HCC)    Constipation    DDD (degenerative disc disease), lumbar    Depression    Diabetes mellitus    Diabetic peripheral neuropathy associated with type 2 diabetes mellitus (HCC)    Diabetic retinopathy ([place])    Diabetic retinopathy associated with type 2 diabetes mellitus ([place])    s/p laser treatment multiple.  Unable to drive.   Fatty liver    Fibromyalgia    Food allergy    GERD (gastroesophageal reflux disease)    Hypercholesteremia    Hyperlipidemia    Hypertension    controlled, off meds    IBS (irritable bowel syndrome)    Leg edema    Neuromuscular disorder (HCC)    OSA (obstructive sleep apnea)    Osteoarthritis    Rheumatic fever    Rheumatoid arthritis (HCC)    Stomach ulcer    Swallowing difficulty    TIA (transient ischemic attack)     Ulcer    Peptic ulcer H. Pylori + s/p treatment.  Upper GI diagnosed.[name] Prilosec PRN .   Patient Active Problem List   Diagnosis Date Noted   Acute cystitis with hematuria 06/02/2022   Intertrigo 06/02/2022   Major depressive disorder, single episode, mild ([place]) 01/12/2022   Encounter for well adult exam with abnormal findings 12/31/2021   CKD (chronic kidney disease) stage 3, GFR 30-59 ml/min ([place]) 12/31/2021   Dysphagia 07/26/2020   Abdominal pain 07/25/2020   Chronic gout due to renal impairment of multiple sites without tophus 02/05/2020   Drug-induced constipation 02/05/2020   Gastroesophageal reflux disease without esophagitis 02/05/2020   Urge incontinence of urine 02/05/2020   Vitamin D deficiency 01/03/2019   Arthritis 10/28/2018   Degenerative scoliosis 10/02/2018   Pain in the coccyx 10/02/2018   Visual changes 04/11/2018   Paresthesia 04/11/2018   OSA (obstructive sleep apnea) 09/09/2017   Nocturnal hypoxemia due to obesity 09/09/2017   Type 2 diabetes mellitus with diabetic polyneuropathy, with long-term current use of insulin ([place]) 09/09/2017   Hypersomnia with sleep apnea 07/21/2017   Elevated blood uric acid level 01/25/2017   Plantar fasciitis of left foot 01/25/2017   Venous stasis 08/23/2016   Rheumatoid arthritis involving both hands with positive rheumatoid factor ([place]) 10/30/2015  Chronic pain syndrome 01/22/2015   Chronic renal insufficiency 01/22/2015   Pure hypercholesterolemia 01/01/2013   Essential hypertension, benign 01/01/2013   Degenerative disc disease, lumbar 01/01/2013   Depression 01/01/2013   Osteoarthritis of right hip 01/01/2013   Diabetic peripheral neuropathy associated with type 2 diabetes mellitus ([place]) 01/01/2013   Diabetic retinopathy ([place]) 01/01/2013   Obesity 01/01/2013   Past Surgical History:  Procedure Laterality Date    2 SPINAL INJECTIONS      ABDOMINAL HYSTERECTOMY  11/02/1979   DUB; cervical dysplasia; ovaries  intact.   ABDOMINAL SURGERY     staph abcess    [facility] Admission     age 47; three months in [place].   BIOPSY  07/26/2020   Procedure: BIOPSY;  Surgeon: Arta Silence, MD;  Location: [facility];  Service: Endoscopy;;   BREAST BIOPSY     CARDIAC CATHETERIZATION  11/02/2007   normal coronary arteries.   CARPAL TUNNEL RELEASE     Bilateral.   CATARACT EXTRACTION, BILATERAL     CHOLECYSTECTOMY     ESOPHAGEAL MANOMETRY N/A 09/14/2017   Procedure: ESOPHAGEAL MANOMETRY (EM);  Surgeon: Ronnette Juniper, MD;  [facility]: [facility];  Service: Gastroenterology;  Laterality: N/A;   ESOPHAGOGASTRODUODENOSCOPY (EGD) WITH PROPOFOL N/A 07/26/2020   Procedure: ESOPHAGOGASTRODUODENOSCOPY (EGD) WITH PROPOFOL;  Surgeon: Arta Silence, MD;  [facility];  Service: Endoscopy;  Laterality: N/A;   EYE SURGERY     Cataracts B. Laser surgery x 7 for Diabetic Retinopathy   TONSILLECTOMY     Current Outpatient Medications on File Prior to Visit  Medication Sig Dispense Refill   acetaminophen (TYLENOL) 325 MG tablet Take 650 mg by mouth every 6 (six) hours as needed (every 6 weeks before RA infusion).     allopurinol (ZYLOPRIM) 100 MG tablet Take 1 tablet (100 mg total) by mouth daily. Ov needed 90 tablet 3   allopurinol (ZYLOPRIM) 100 MG tablet Take 2 tablets by mouth daily.     amLODipine (NORVASC) 2.5 MG tablet Take 2.5 mg by mouth daily in the afternoon.     amLODipine (NORVASC) 5 MG tablet TAKE 1 TABLET BY MOUTH EVERY DAY Oral for 90     bacitracin 500 UNIT/GM ointment SMARTSIG:Sparingly Topical As Directed     cholecalciferol (VITAMIN D3) 25 MCG (1000 UNIT) tablet Take 1,000 Units by mouth daily.     diclofenac sodium (VOLTAREN) 1 % GEL Apply 2 g topically 4 (four) times daily. 100 g 3   diclofenac Sodium (VOLTAREN) 1 % GEL APPLY AS DIRECTED FOUR TIMES DAILY AS NEEDED     diphenhydrAMINE (BENADRYL) 25 MG tablet Take 25 mg by mouth every 6 (six) hours as needed (every 6 weeks prior  to RA infusion).     furosemide (LASIX) 20 MG tablet Take 1-3 tablets (20-60 mg total) by mouth daily. 200 tablet 1   furosemide (LASIX) 20 MG tablet Take 20 mg by mouth daily in the afternoon.     HYDROcodone-acetaminophen (NORCO) 10-325 MG tablet Take 1 tablet by mouth every 6 (six) hours as needed.     inFLIXimab (REMICADE) 100 MG injection 10 mg/kg Intravenous 8 weeks     leflunomide (ARAVA) 20 MG tablet 1 tablet Orally Once a day for 30 day(s)     linaclotide (LINZESS) 290 MCG CAPS capsule Take 290 mcg by mouth daily before breakfast.      Multiple Vitamin (MULTIVITAMIN ADULT) TABS 1 tablet Orally Once a day for 30 day(s)     Multiple Vitamin (MULTIVITAMIN  WITH MINERALS) TABS tablet Take 1 tablet by mouth daily.     mupirocin ointment (BACTROBAN) 2 % APPLY TO AFFECTED AREA 3 TIMES A DAY     Needles & Syringes MISC 1 Syringe by Does not apply route 2 (two) times daily. (Patient not taking: Reported on 12/08/2021) 100 each 11   nystatin cream (MYCOSTATIN) Apply topically 2 (two) times daily.     omeprazole (PRILOSEC) 20 MG capsule TAKE 1 CAPSULE BY MOUTH EVERY DAY (Patient taking differently: Take 20 mg by mouth daily.) 90 capsule 3   oxybutynin (DITROPAN XL) 15 MG 24 hr tablet TAKE 1 TABLET BY MOUTH AT BEDTIME (Patient taking differently: Take 15 mg by mouth at bedtime.) 90 tablet 3   predniSONE (DELTASONE) 5 MG tablet Take 5 mg by mouth daily as needed. Only takes with RA Flare. (Patient not taking: Reported on 12/08/2021)     rosuvastatin (CRESTOR) 20 MG tablet Take 1 tablet (20 mg total) by mouth daily. 90 tablet 3   simvastatin (ZOCOR) 20 MG tablet 1 tablet in the evening Orally Once a day     traZODone (DESYREL) 100 MG tablet TAKE 2 TABLETS BY MOUTH EVERY DAY AT BEDTIME (Patient taking differently: Take 200 mg by mouth at bedtime.) 180 tablet 1   zaleplon (SONATA) 5 MG capsule      No current facility-administered medications on file prior to visit.    Allergies  Allergen Reactions    Codeine Anaphylaxis   Iodinated Contrast Media Anaphylaxis    Other reaction(s): respiratory distress   Nitrofurantoin Monohyd Macro Anaphylaxis   Betadine [Povidone Iodine] Itching   Folic Acid Itching   Gabapentin Other (See Comments)    Makes patient feel drunk   Iodine Hives   Lyrica [Pregabalin] Other (See Comments)    Makes patient feel drunk   Red Dye Itching    Other reaction(s): Unknown   Ultram [Tramadol Hcl] Nausea And Vomiting   Social History   Occupational History   Occupation: retired    Comment: retretied  Tobacco Use   Smoking status: Former    Years: 22.00    Types: Cigarettes   Smokeless tobacco: Never   Tobacco comments:    Quit 1987  Substance and Sexual Activity   Alcohol use: No    Alcohol/week: 0.0 standard drinks of alcohol   Drug use: No   Sexual activity: Yes    Birth control/protection: Surgical, Post-menopausal    Comment: widow   Family History  Adopted: Yes  Problem Relation Age of Onset   Breast cancer Daughter    Immunization History  Administered Date(s) Administered   Fluad Quad(high Dose 65+) 07/08/2021   Hepatitis A, Adult 10/06/2015, 08/12/2016   Influenza, High Dose Seasonal PF 09/15/2018   Influenza,inj,Quad PF,6+ Mos 07/16/2013, 07/10/2014, 10/15/2015, 08/12/2016, 06/28/2017   Influenza-Unspecified 07/16/2013, 07/10/2014, 10/15/2015, 08/12/2016, 06/28/2017   PFIZER Comirnaty(Gray Top)Covid-19 Tri-[place]rose Vaccine 01/10/2020, 02/06/2020, 08/07/2020, 03/05/2021   PFIZER(Purple Top)SARS-COV-2 Vaccination 01/10/2020, 02/06/2020   Pfizer Covid-19 Vaccine Bivalent Booster 55yr & up 10/13/2021   Pneumococcal Conjugate-13 03/23/2017   Pneumococcal Polysaccharide-23 07/10/2014, 10/19/2019, 12/31/2021   Td 01/12/2022   Tdap 10/06/2015   Zoster Recombinat (Shingrix) 02/24/2021     Review of Systems: Negative except as noted in the HPI.   Objective: Vitals:   05/31/22 1345  BP: 134/63  Pulse: (!) 58  Temp: 98.1 F (36.7 C)     Mykira K NNghiemis a pleasant 71y.o. female in NAD. AAO X 3.  Objective:  Vascular Examination: Vascular status intact b/l with palpable pedal pulses. Pedal hair sparse b/l. CFT immediate b/l. No edema. No pain with calf compression b/l. Skin temperature gradient WNL b/l.   Neurological Examination: Sensation grossly intact b/l with 10 gram monofilament. Vibratory sensation intact b/l. Pt has subjective symptoms of neuropathy.  Dermatological Examination: Toenails 1-5 b/l thick, discolored, elongated with subungual debris and pain on dorsal palpation. No hyperkeratotic lesions noted b/l. Pedal skin is warm and supple b/l LE. Pedal skin noted to be dry b/l lower extremities.  Musculoskeletal Examination: Muscle strength 5/5 to b/l LE. HAV with bunion deformity noted b/l LE. Patient ambulates independent of any assistive aids.  Footwear Assessment: Does the patient wear appropriate shoes? Yes. Does the patient need inserts/orthotics?  No.  Radiographs: None  Last A1c:      Latest Ref Rng & Units 12/31/2021    2:08 PM  Hemoglobin A1C  Hemoglobin-A1c 4.6 - 6.5 % 5.7    ADA Risk Categorization: Low Risk :  Patient has all of the following: Intact protective sensation No prior foot ulcer  No severe deformity Pedal pulses present  Assessment: 1. Pain due to onychomycosis of toenails of both feet   2. Xerosis cutis   3. Hallux valgus, acquired, bilateral   4. Type 2 diabetes mellitus with diabetic polyneuropathy, with long-term current use of insulin ([place])     Plan: -Patient was evaluated and treated. All patient's and/or POA's questions/concerns answered on today's visit. -Diabetic foot examination performed today. -Patient to continue soft, supportive shoe gear daily. -Discussed treatment options for onychomycosis. Patient opted for topical OTC therapy. Patient is to apply 1 drop of tea tree oil to affected toenail(s) once daily. -For dry skin, patient to apply CeraVe  Healing Ointment to both feet once daily. -Patient/POA to call should there be question/concern in the interim. Return in about 3 months (around 08/31/2022).  Marzetta Board, DPM

## 2022-06-09 ENCOUNTER — Encounter (INDEPENDENT_AMBULATORY_CARE_PROVIDER_SITE_OTHER): Payer: Self-pay

## 2022-07-02 ENCOUNTER — Telehealth: Payer: Self-pay | Admitting: *Deleted

## 2022-07-02 ENCOUNTER — Other Ambulatory Visit (INDEPENDENT_AMBULATORY_CARE_PROVIDER_SITE_OTHER): Payer: Medicare Other

## 2022-07-02 DIAGNOSIS — Z794 Long term (current) use of insulin: Secondary | ICD-10-CM

## 2022-07-02 DIAGNOSIS — E559 Vitamin D deficiency, unspecified: Secondary | ICD-10-CM

## 2022-07-02 DIAGNOSIS — E1142 Type 2 diabetes mellitus with diabetic polyneuropathy: Secondary | ICD-10-CM

## 2022-07-02 LAB — HEPATIC FUNCTION PANEL
ALT: 13 U/L (ref 0–35)
AST: 18 U/L (ref 0–37)
Albumin: 4.1 g/dL (ref 3.5–5.2)
Alkaline Phosphatase: 75 U/L (ref 39–117)
Bilirubin, Direct: 0.2 mg/dL (ref 0.0–0.3)
Total Bilirubin: 0.8 mg/dL (ref 0.2–1.2)
Total Protein: 7.8 g/dL (ref 6.0–8.3)

## 2022-07-02 LAB — BASIC METABOLIC PANEL
BUN: 26 mg/dL — ABNORMAL HIGH (ref 6–23)
CO2: 35 mEq/L — ABNORMAL HIGH (ref 19–32)
Calcium: 10 mg/dL (ref 8.4–10.5)
Chloride: 94 mEq/L — ABNORMAL LOW (ref 96–112)
Creatinine, Ser: 2.08 mg/dL — ABNORMAL HIGH (ref 0.40–1.20)
GFR: 23.7 mL/min — ABNORMAL LOW (ref >60.00)
Glucose, Bld: 170 mg/dL — ABNORMAL HIGH (ref 70–99)
Potassium: 3.3 mEq/L — ABNORMAL LOW (ref 3.5–5.1)
Sodium: 138 mEq/L (ref 135–145)

## 2022-07-02 LAB — LIPID PANEL
Cholesterol: 150 mg/dL (ref 0–200)
HDL: 37.7 mg/dL — ABNORMAL LOW (ref >39.00)
NonHDL: 111.84
Total CHOL/HDL Ratio: 4
Triglycerides: 260 mg/dL — ABNORMAL HIGH (ref 0.0–149.0)
VLDL: 52 mg/dL — ABNORMAL HIGH (ref 0.0–40.0)

## 2022-07-02 LAB — HEMOGLOBIN A1C: Hgb A1c MFr Bld: 7.2 % — ABNORMAL HIGH (ref 4.6–6.5)

## 2022-07-02 LAB — VITAMIN D 25 HYDROXY (VIT D DEFICIENCY, FRACTURES): VITD: 102.37 ng/mL (ref 30.00–100.00)

## 2022-07-02 LAB — LDL CHOLESTEROL, DIRECT: Direct LDL: 62 mg/dL

## 2022-07-02 NOTE — Telephone Encounter (Signed)
Called patient to informed her of provider recommendation . Patient states that she already stopped the vitamin D

## 2022-07-02 NOTE — Telephone Encounter (Signed)
CRITICAL VALUE STICKER  CRITICAL VALUE: vitamin D 102.37  RECEIVER (on-site recipient of call):Don'Quashia Prince Olivier  DATE & TIME NOTIFIED: 07/02/2022 @ 1:00pm  MESSENGER (representative from lab): [facility]   MD NOTIFIED:  yes  TIME OF NOTIFICATION:1:00pm  RESPONSE:  waiting MD response

## 2022-07-02 NOTE — Telephone Encounter (Signed)
Ok to stop over the counter vitamin D.

## 2022-07-05 ENCOUNTER — Other Ambulatory Visit: Payer: Self-pay | Admitting: Internal Medicine

## 2022-07-05 DIAGNOSIS — N189 Chronic kidney disease, unspecified: Secondary | ICD-10-CM

## 2022-07-08 ENCOUNTER — Other Ambulatory Visit: Payer: Self-pay | Admitting: Internal Medicine

## 2022-07-08 ENCOUNTER — Ambulatory Visit (INDEPENDENT_AMBULATORY_CARE_PROVIDER_SITE_OTHER): Payer: Medicare Other | Admitting: Internal Medicine

## 2022-07-08 ENCOUNTER — Encounter: Payer: Self-pay | Admitting: Internal Medicine

## 2022-07-08 VITALS — BP 92/68 | HR 72 | Temp 98.5°F | Ht 62.0 in | Wt 175.0 lb

## 2022-07-08 DIAGNOSIS — R1030 Lower abdominal pain, unspecified: Secondary | ICD-10-CM

## 2022-07-08 DIAGNOSIS — E7800 Pure hypercholesterolemia, unspecified: Secondary | ICD-10-CM | POA: Diagnosis not present

## 2022-07-08 DIAGNOSIS — I10 Essential (primary) hypertension: Secondary | ICD-10-CM

## 2022-07-08 DIAGNOSIS — Z23 Encounter for immunization: Secondary | ICD-10-CM

## 2022-07-08 DIAGNOSIS — E1142 Type 2 diabetes mellitus with diabetic polyneuropathy: Secondary | ICD-10-CM

## 2022-07-08 DIAGNOSIS — Z794 Long term (current) use of insulin: Secondary | ICD-10-CM | POA: Diagnosis not present

## 2022-07-08 DIAGNOSIS — N1832 Chronic kidney disease, stage 3b: Secondary | ICD-10-CM | POA: Diagnosis not present

## 2022-07-08 DIAGNOSIS — E559 Vitamin D deficiency, unspecified: Secondary | ICD-10-CM

## 2022-07-08 MED ORDER — RYBELSUS 3 MG PO TABS: 3.0000 mg | ORAL_TABLET | Freq: Every day | ORAL | 3 refills | Status: DC

## 2022-07-08 NOTE — Patient Instructions (Addendum)
Please remember to have the second [facility]  You had the flu shot today  Ok to restart the lasix  Please take all new medication as prescribed- the rybelsus 3 mg for sugar and wt loss  Please continue all other medications as before, and refills have been done if requested.  Please have the pharmacy call with any other refills you may need.  Please continue your efforts at being more active, low cholesterol diet, and weight control.  Please keep your appointments with your specialists as you may have planned - Dr Posey Pronto later this month  You will be contacted regarding the referral for: kidney ultrasound  Please make an Appointment to return in 6 months, or sooner if needed, also with Lab Appointment for testing done 3-5 days before at the [facility] (so this is for TWO appointments - please see the scheduling desk as you leave)

## 2022-07-08 NOTE — Progress Notes (Addendum)
Patient ID: Angel French, female   DOB: Nov 14, 1950, 71 y.o.   MRN: [id number]        Chief Complaint: follow up HTN, HLD and DM, CKD       HPI:  Angel French is a 71 y.o. female here overall doing ok, except renal fxn has taken a relatively sudden turn for the worse recently, has f/u with DR Angel French Renal later this month.   Has not had imaging.   Has known hx of relatively large left adrenal mass, but stable at last  MRI dec 2021.  Has been holding lasix for 4 days, with some LLE trace swelling.  Sugar and triglycerides both worsening mild in hte last few months as well.  Pt denies chest pain, increased sob or doe, wheezing, orthopnea, PND, palpitations, dizziness or syncope. Pt denies polydipsia, polyuria, or new focal neuro s/s.    Pt denies fever, wt loss, night sweats, loss of appetite, or other constitutional symptoms  In fact would like to try wt loss, such as with rybelsus.  For flu shot       Wt Readings from Last 3 Encounters:  07/08/22 175 lb (79.4 kg)  06/02/22 181 lb (82.1 kg)  12/31/21 188 lb (85.3 kg)   BP Readings from Last 3 Encounters:  07/08/22 92/68  06/02/22 132/74  05/31/22 134/63         Past Medical History:  Diagnosis Date   Allergy    generic allergy pill; Spring and Fall only.   Anxiety    Arthritis    DDD lumbar, R hip OA.  s/p ortho consult in past.   Blood transfusion without reported diagnosis    Mountain climbing accident in [place].   Brachial plexus disorders    Cataract    B retractions.   Chronic kidney disease    stage 3 per pt.    Chronic pain syndrome    Chronic renal insufficiency, stage 3 (moderate) (HCC)    Constipation    DDD (degenerative disc disease), lumbar    Depression    Diabetes mellitus    Diabetic peripheral neuropathy associated with type 2 diabetes mellitus (HCC)    Diabetic retinopathy ([place])    Diabetic retinopathy associated with type 2 diabetes mellitus ([name])    s/p laser treatment multiple.  Unable to drive.   Fatty  liver    Fibromyalgia    Food allergy    GERD (gastroesophageal reflux disease)    Hypercholesteremia    Hyperlipidemia    Hypertension    controlled, off meds    IBS (irritable bowel syndrome)    Leg edema    Neuromuscular disorder (HCC)    OSA (obstructive sleep apnea)    Osteoarthritis    Rheumatic fever    Rheumatoid arthritis (HCC)    Stomach ulcer    Swallowing difficulty    TIA (transient ischemic attack)    Ulcer    Peptic ulcer H. Pylori + s/p treatment.  Upper GI diagnosed.Angel French Prilosec PRN .   Past Surgical History:  Procedure Laterality Date    2 SPINAL INJECTIONS      ABDOMINAL HYSTERECTOMY  11/02/1979   DUB; cervical dysplasia; ovaries intact.   ABDOMINAL SURGERY     staph abcess    [facility] Admission     age 22; three months in [place].   BIOPSY  07/26/2020   Procedure: BIOPSY;  Surgeon: Arta Silence, MD;  [facility];  Service: Endoscopy;;  BREAST BIOPSY     CARDIAC CATHETERIZATION  11/02/2007   normal coronary arteries.   CARPAL TUNNEL RELEASE     Bilateral.   CATARACT EXTRACTION, BILATERAL     CHOLECYSTECTOMY     ESOPHAGEAL MANOMETRY N/A 09/14/2017   Procedure: ESOPHAGEAL MANOMETRY (EM);  Surgeon: Ronnette Juniper, MD;  [facility];  Service: Gastroenterology;  Laterality: N/A;   ESOPHAGOGASTRODUODENOSCOPY (EGD) WITH PROPOFOL N/A 07/26/2020   Procedure: ESOPHAGOGASTRODUODENOSCOPY (EGD) WITH PROPOFOL;  Surgeon: Arta Silence, MD;  [facility];  Service: Endoscopy;  Laterality: N/A;   EYE SURGERY     Cataracts B. Laser surgery x 7 for Diabetic Retinopathy   TONSILLECTOMY      reports that she has quit smoking. Her smoking use included cigarettes. She has never used smokeless tobacco. She reports that she does not drink alcohol and does not use drugs. family history includes Breast cancer in her daughter. She was adopted. Allergies  Allergen Reactions   Codeine Anaphylaxis   Iodinated Contrast Media  Anaphylaxis    Other reaction(s): respiratory distress   Nitrofurantoin Monohyd Macro Anaphylaxis   Betadine [Povidone Iodine] Itching   Folic Acid Itching   Gabapentin Other (See Comments)    Makes patient feel drunk   Iodine Hives   Lyrica [Pregabalin] Other (See Comments)    Makes patient feel drunk   Red Dye Itching    Other reaction(s): Unknown   Ultram [Tramadol Hcl] Nausea And Vomiting   Current Outpatient Medications on File Prior to Visit  Medication Sig Dispense Refill   acetaminophen (TYLENOL) 325 MG tablet Take 650 mg by mouth every 6 (six) hours as needed (every 6 weeks before RA infusion).     allopurinol (ZYLOPRIM) 100 MG tablet Take 1 tablet (100 mg total) by mouth daily. Ov needed 90 tablet 3   amLODipine (NORVASC) 2.5 MG tablet Take 2.5 mg by mouth daily in the afternoon.     bacitracin 500 UNIT/GM ointment SMARTSIG:Sparingly Topical As Directed     cholecalciferol (VITAMIN D3) 25 MCG (1000 UNIT) tablet Take 1,000 Units by mouth daily.     clotrimazole-betamethasone (LOTRISONE) cream Apply 1 Application topically 2 (two) times daily. 30 g 0   diclofenac Sodium (VOLTAREN) 1 % GEL APPLY AS DIRECTED FOUR TIMES DAILY AS NEEDED     diphenhydrAMINE (BENADRYL) 25 MG tablet Take 25 mg by mouth every 6 (six) hours as needed (every 6 weeks prior to RA infusion).     escitalopram (LEXAPRO) 20 MG tablet Take by mouth.     furosemide (LASIX) 20 MG tablet Take 1-3 tablets (20-60 mg total) by mouth daily. 200 tablet 1   HYDROcodone-acetaminophen (NORCO) 10-325 MG tablet Take 1 tablet by mouth every 6 (six) hours as needed.     inFLIXimab (REMICADE) 100 MG injection 10 mg/kg Intravenous 8 weeks     leflunomide (ARAVA) 20 MG tablet 1 tablet Orally Once a day for 30 day(s)     linaclotide (LINZESS) 290 MCG CAPS capsule Take 290 mcg by mouth daily before breakfast.      Multiple Vitamin (MULTIVITAMIN WITH MINERALS) TABS tablet Take 1 tablet by mouth daily.     mupirocin ointment  (BACTROBAN) 2 % APPLY TO AFFECTED AREA 3 TIMES A DAY     nystatin cream (MYCOSTATIN) Apply topically 2 (two) times daily.     omeprazole (PRILOSEC) 20 MG capsule TAKE 1 CAPSULE BY MOUTH EVERY DAY (Patient taking differently: Take 20 mg by mouth daily.) 90 capsule 3   oxybutynin (DITROPAN  XL) 15 MG 24 hr tablet TAKE 1 TABLET BY MOUTH AT BEDTIME (Patient taking differently: Take 15 mg by mouth at bedtime.) 90 tablet 3   rosuvastatin (CRESTOR) 20 MG tablet Take 1 tablet (20 mg total) by mouth daily. 90 tablet 3   simvastatin (ZOCOR) 20 MG tablet 1 tablet in the evening Orally Once a day     traZODone (DESYREL) 100 MG tablet TAKE 2 TABLETS BY MOUTH EVERY DAY AT BEDTIME (Patient taking differently: Take 200 mg by mouth at bedtime.) 180 tablet 1   zaleplon (SONATA) 5 MG capsule      Zoster Vaccine Adjuvanted [facility]) injection as directed Intramuscular once then again as directed for 30 days     No current facility-administered medications on file prior to visit.        ROS:  All others reviewed and negative.  Objective        PE:  BP 92/68 (BP Location: Right Arm, Patient Position: Sitting, Cuff Size: Large)   Pulse 72   Temp 98.5 F (36.9 C) (Oral)   Ht '5\' 2"'$  (1.575 m)   Wt 175 lb (79.4 kg)   SpO2 99%   BMI 32.01 kg/m                 Constitutional: Pt appears in NAD               HENT: Head: NCAT.                Right Ear: External ear normal.                 Left Ear: External ear normal.                Eyes: . Pupils are equal, round, and reactive to light. Conjunctivae and EOM are normal               Nose: without d/c or deformity               Neck: Neck supple. Gross normal ROM               Cardiovascular: Normal rate and regular rhythm.                 Pulmonary/Chest: Effort normal and breath sounds without rales or wheezing.                Abd:  Soft, NT, ND, + BS, no organomegaly               Neurological: Pt is alert. At baseline orientation, motor grossly intact                Skin: Skin is warm. No rashes, no other new lesions, LE edema - trace to 1+ bilateral left > right               Psychiatric: Pt behavior is normal without agitation   Micro: none  Cardiac tracings I have personally interpreted today:  none  Pertinent Radiological findings (summarize): none   Lab Results  Component Value Date   WBC 4.9 12/31/2021   HGB 10.1 (L) 12/31/2021   HCT 30.4 (L) 12/31/2021   PLT 214.0 12/31/2021   GLUCOSE 170 (H) 07/02/2022   CHOL 150 07/02/2022   TRIG 260.0 (H) 07/02/2022   HDL 37.70 (L) 07/02/2022   LDLDIRECT 62.0 07/02/2022   LDLCALC 80 01/02/2019   ALT 13 07/02/2022   AST 18 07/02/2022   NA 138  07/02/2022   K 3.3 (L) 07/02/2022   CL 94 (L) 07/02/2022   CREATININE 2.08 (H) 07/02/2022   BUN 26 (H) 07/02/2022   CO2 35 (H) 07/02/2022   TSH 1.01 12/31/2021   INR 0.9 06/23/2020   HGBA1C 7.2 (H) 07/02/2022   MICROALBUR 0.9 12/31/2021   Assessment/Plan:  Angel French is a 71 y.o. White or Caucasian [1] female with  has a past medical history of Allergy, Anxiety, Arthritis, Blood transfusion without reported diagnosis, Brachial plexus disorders, Cataract, Chronic kidney disease, Chronic pain syndrome, Chronic renal insufficiency, stage 3 (moderate) (HCC), Constipation, DDD (degenerative disc disease), lumbar, Depression, Diabetes mellitus, Diabetic peripheral neuropathy associated with type 2 diabetes mellitus ([place]), Diabetic retinopathy ([place]), Diabetic retinopathy associated with type 2 diabetes mellitus ([place]), Fatty liver, Fibromyalgia, Food allergy, GERD (gastroesophageal reflux disease), Hypercholesteremia, Hyperlipidemia, Hypertension, IBS (irritable bowel syndrome), Leg edema, Neuromuscular disorder ([place]), OSA (obstructive sleep apnea), Osteoarthritis, Rheumatic fever, Rheumatoid arthritis ([place]), Stomach ulcer, Swallowing difficulty, TIA (transient ischemic attack), and Ulcer.  Essential hypertension, benign BP Readings from Last 3  Encounters:  07/08/22 92/68  06/02/22 132/74  05/31/22 134/63   Low normal, asympt, pt to continue medical treatment norvasc 2.5 qd   Pure hypercholesterolemia Lab Results  Component Value Date   [place] 80 01/02/2019   Uncontrolled, tolerating crestor 20 qd, for f/u lab   Type 2 diabetes mellitus with diabetic polyneuropathy, with long-term current use of insulin (HCC) Lab Results  Component Value Date   HGBA1C 7.2 (H) 07/02/2022  new worsening DM,  pt to start rybelsus 3 mg qd   Vitamin D deficiency Last vitamin D Lab Results  Component Value Date   VD25OH 102.37 (HH) 07/02/2022   Stable, cont oral replacement   CKD (chronic kidney disease) stage 3, GFR 30-59 ml/min (HCC) With recent worsening, followed per Dr Angel French renal with next appt sept 26, also for f/u renal u/s; also restart lasix 20 qd  Lab Results  Component Value Date   CREATININE 2.08 (H) 07/02/2022   cont to avoid nephrotoxins  Followup: Return in about 6 months (around 01/06/2023).  Cathlean Cower, MD 07/10/2022 9:16 PM [facility]

## 2022-07-10 ENCOUNTER — Encounter: Payer: Self-pay | Admitting: Internal Medicine

## 2022-07-10 NOTE — Assessment & Plan Note (Addendum)
Lab Results  Component Value Date   LDLCALC 80 01/02/2019   Uncontrolled, tolerating crestor 20 qd, for f/u lab

## 2022-07-10 NOTE — Assessment & Plan Note (Signed)
BP Readings from Last 3 Encounters:  07/08/22 92/68  06/02/22 132/74  05/31/22 134/63   Low normal, asympt, pt to continue medical treatment norvasc 2.5 qd

## 2022-07-10 NOTE — Assessment & Plan Note (Signed)
Lab Results  Component Value Date   HGBA1C 7.2 (H) 07/02/2022  new worsening DM,  pt to start rybelsus 3 mg qd

## 2022-07-10 NOTE — Assessment & Plan Note (Addendum)
With recent worsening, followed per Dr Posey Pronto renal with next appt sept 26, also for f/u renal u/s; also restart lasix 20 qd  Lab Results  Component Value Date   CREATININE 2.08 (H) 07/02/2022   cont to avoid nephrotoxins

## 2022-07-10 NOTE — Assessment & Plan Note (Signed)
Last vitamin D Lab Results  Component Value Date   VD25OH 102.37 (HH) 07/02/2022   Stable, cont oral replacement  

## 2022-07-12 ENCOUNTER — Telehealth: Payer: Self-pay | Admitting: Internal Medicine

## 2022-07-12 ENCOUNTER — Ambulatory Visit
Admission: RE | Admit: 2022-07-12 | Discharge: 2022-07-12 | Disposition: A | Discharge status: Home / Self Care | Payer: Medicare Other | Source: Ambulatory Visit | Attending: Internal Medicine | Admitting: Internal Medicine

## 2022-07-12 DIAGNOSIS — N1832 Chronic kidney disease, stage 3b: Secondary | ICD-10-CM

## 2022-07-12 NOTE — Telephone Encounter (Signed)
Pt took Semaglutide (RYBELSUS) 3 MG TABS as prescribed by Dr. Jenny Reichmann last week and has been feeling sick ever since. Pt couldn't get out of bed all weekend because the RX made her feel nauseous.   Pt has stopped taking the RX and would like a nurse to call her back to discuss better choices.   Please call Angel French: [phone]

## 2022-07-15 ENCOUNTER — Other Ambulatory Visit: Payer: Self-pay | Admitting: Internal Medicine

## 2022-07-15 MED ORDER — DAPAGLIFLOZIN PROPANEDIOL 5 MG PO TABS: 5.0000 mg | ORAL_TABLET | Freq: Every day | ORAL | 3 refills | Status: DC

## 2022-07-15 MED ORDER — DAPAGLIFLOZIN PROPANEDIOL 5 MG PO TABS
5.0000 mg | ORAL_TABLET | Freq: Every day | ORAL | 3 refills | Status: DC
Start: 2022-07-15 — End: 2022-09-13

## 2022-07-15 NOTE — Telephone Encounter (Signed)
Ok for change rybelsus to the farxiga 5 mg per day which is different type of med , very easy to take and no stomach trouble

## 2022-07-15 NOTE — Telephone Encounter (Signed)
Spoke with regarding her symptoms after taking rybelsus. She agrees to try farxiga at '5mg'$  per  day. [facility] to [facility] the [employer] on [address].

## 2022-07-15 NOTE — Telephone Encounter (Signed)
Patient called because she has not heard back from anyone in 3 days.

## 2022-08-25 ENCOUNTER — Other Ambulatory Visit: Payer: Self-pay | Admitting: Internal Medicine

## 2022-08-25 DIAGNOSIS — Z1231 Encounter for screening mammogram for malignant neoplasm of breast: Secondary | ICD-10-CM

## 2022-09-09 ENCOUNTER — Ambulatory Visit: Payer: Medicare Other

## 2022-09-13 ENCOUNTER — Encounter: Payer: Self-pay | Admitting: Podiatry

## 2022-09-13 ENCOUNTER — Ambulatory Visit (INDEPENDENT_AMBULATORY_CARE_PROVIDER_SITE_OTHER): Payer: Medicare Other | Admitting: Podiatry

## 2022-09-13 DIAGNOSIS — M79674 Pain in right toe(s): Secondary | ICD-10-CM

## 2022-09-13 DIAGNOSIS — Z794 Long term (current) use of insulin: Secondary | ICD-10-CM

## 2022-09-13 DIAGNOSIS — M79675 Pain in left toe(s): Secondary | ICD-10-CM

## 2022-09-13 DIAGNOSIS — B351 Tinea unguium: Secondary | ICD-10-CM

## 2022-09-13 DIAGNOSIS — E1142 Type 2 diabetes mellitus with diabetic polyneuropathy: Secondary | ICD-10-CM | POA: Diagnosis not present

## 2022-09-13 NOTE — Progress Notes (Signed)
  Subjective:  Patient ID: Angel French, female    DOB: 06-25-1951,  MRN: [id number]  Angel French presents to clinic today for at risk foot care with history of diabetic neuropathy and painful thick toenails that are difficult to trim. Pain interferes with ambulation. Aggravating factors include wearing enclosed shoe gear. Pain is relieved with periodic professional debridement.  Chief Complaint  Patient presents with   Nail Problem    Diabetic foot care BS-97 A1C-7.2 PCP- Cathlean Cower PCP VST- 2 months ago   Patient states she is starting to have pain in the right great toe joint. She is not sure if she if it is the beginning of a gout flare. She takes allopurinol once daily, but is supposed to take twice daily.  PCP is Biagio Borg, MD.  Allergies  Allergen Reactions   Codeine Anaphylaxis   Iodinated Contrast Media Anaphylaxis    Other reaction(s): respiratory distress   Nitrofurantoin Monohyd Macro Anaphylaxis   Betadine [Povidone Iodine] Itching   Folic Acid Itching   Gabapentin Other (See Comments)    Makes patient feel drunk   Iodine Hives   Lyrica [Pregabalin] Other (See Comments)    Makes patient feel drunk   Red Dye Itching    Other reaction(s): Unknown   Ultram [Tramadol Hcl] Nausea And Vomiting    Review of Systems: Negative except as noted in the HPI.  Objective: No changes noted in today's physical examination.  Angel French is a pleasant 71 y.o. female in NAD. AAO x 3.  Vascular Examination: Vascular status intact b/l with palpable pedal pulses. Pedal hair sparse b/l. CFT immediate b/l. No edema. No pain with calf compression b/l. Skin temperature gradient WNL b/l.   Neurological Examination: Sensation grossly intact b/l with 10 gram monofilament. Vibratory sensation intact b/l. Pt has subjective symptoms of neuropathy.  Dermatological Examination: Toenails 1-5 b/l thick, discolored, elongated with subungual debris and pain on dorsal palpation. No  hyperkeratotic lesions noted b/l. Pedal skin is warm and supple b/l LE. Pedal skin noted to be dry b/l lower extremities.  Musculoskeletal Examination: Muscle strength 5/5 to b/l LE. HAV with bunion deformity noted b/l LE. Patient ambulates independent of any assistive aids.  Right 1st MPJ with normal temperature and appearance. No pain out of proportion with palpation.  Assessment/Plan: 1. Pain due to onychomycosis of toenails of both feet   2. Type 2 diabetes mellitus with diabetic polyneuropathy, with long-term current use of insulin (HCC)     No orders of the defined types were placed in this encounter.   -Consent given for treatment as described below: -Examined patient. -Right 1st MPJ is not warm nor red today. Offered her to get bloodwork to check uric acid. Patient declined and stated she would increase her allopurinol to the prescribed dose. -Patient instructed to continue use of tea tree oil with q-tip applicator to affected toenails once daily. -Mycotic toenails 1-5 bilaterally were debrided in length and girth with sterile nail nippers and dremel without incident. -Patient/POA to call should there be question/concern in the interim.   Return in about 3 months (around 12/14/2022).  Marzetta Board, DPM

## 2022-09-27 ENCOUNTER — Other Ambulatory Visit: Payer: Self-pay | Admitting: Internal Medicine

## 2022-09-27 NOTE — Telephone Encounter (Signed)
Please refill as per office routine med refill policy (all routine meds to be refilled for 3 mo or monthly (per pt preference) up to one year from last visit, then month to month grace period for 3 mo, then further med refills will have to be denied) ? ?

## 2022-10-12 ENCOUNTER — Other Ambulatory Visit (INDEPENDENT_AMBULATORY_CARE_PROVIDER_SITE_OTHER): Payer: Medicare Other

## 2022-10-12 DIAGNOSIS — N179 Acute kidney failure, unspecified: Secondary | ICD-10-CM | POA: Diagnosis not present

## 2022-10-12 DIAGNOSIS — N189 Chronic kidney disease, unspecified: Secondary | ICD-10-CM | POA: Diagnosis not present

## 2022-10-12 LAB — BASIC METABOLIC PANEL
BUN: 22 mg/dL (ref 6–23)
CO2: 29 mEq/L (ref 19–32)
Calcium: 9.7 mg/dL (ref 8.4–10.5)
Chloride: 102 mEq/L (ref 96–112)
Creatinine, Ser: 1.46 mg/dL — ABNORMAL HIGH (ref 0.40–1.20)
GFR: 36.17 mL/min — ABNORMAL LOW (ref >60.00)
Glucose, Bld: 111 mg/dL — ABNORMAL HIGH (ref 70–99)
Potassium: 4.1 mEq/L (ref 3.5–5.1)
Sodium: 139 mEq/L (ref 135–145)

## 2022-10-13 ENCOUNTER — Ambulatory Visit (INDEPENDENT_AMBULATORY_CARE_PROVIDER_SITE_OTHER): Payer: Medicare Other | Admitting: Internal Medicine

## 2022-10-13 ENCOUNTER — Encounter: Payer: Self-pay | Admitting: Internal Medicine

## 2022-10-13 VITALS — BP 120/60 | HR 72 | Temp 98.9°F | Ht 62.0 in | Wt 170.0 lb

## 2022-10-13 DIAGNOSIS — N1832 Chronic kidney disease, stage 3b: Secondary | ICD-10-CM | POA: Diagnosis not present

## 2022-10-13 DIAGNOSIS — E7800 Pure hypercholesterolemia, unspecified: Secondary | ICD-10-CM | POA: Diagnosis not present

## 2022-10-13 DIAGNOSIS — E559 Vitamin D deficiency, unspecified: Secondary | ICD-10-CM

## 2022-10-13 DIAGNOSIS — Z794 Long term (current) use of insulin: Secondary | ICD-10-CM

## 2022-10-13 DIAGNOSIS — E1142 Type 2 diabetes mellitus with diabetic polyneuropathy: Secondary | ICD-10-CM | POA: Diagnosis not present

## 2022-10-13 DIAGNOSIS — I10 Essential (primary) hypertension: Secondary | ICD-10-CM

## 2022-10-13 LAB — POCT GLYCOSYLATED HEMOGLOBIN (HGB A1C): HbA1c POC (<> result, manual entry): 5.5 % (ref 4.0–5.6)

## 2022-10-13 MED ORDER — LINACLOTIDE 145 MCG PO CAPS: 145.0000 ug | ORAL_CAPSULE | Freq: Every day | ORAL | 11 refills | Status: AC

## 2022-10-13 NOTE — Progress Notes (Signed)
Patient ID: Angel French, female   DOB: [birth year], 71 y.o.   MRN: [id number]        Chief Complaint: follow up HTN, HLD and DM, ckd       HPI:  Angel French is a 71 y.o. female here overall doing ok,  Pt denies chest pain, increased sob or doe, wheezing, orthopnea, PND, increased LE swelling, palpitations, dizziness or syncope.   Pt denies polydipsia, polyuria, or new focal neuro s/s.    Pt denies fever, wt loss, night sweats, loss of appetite, or other constitutional symptoms  Sees renal on regular basis.        Wt Readings from Last 3 Encounters:  10/13/22 170 lb (77.1 kg)  07/08/22 175 lb (79.4 kg)  06/02/22 181 lb (82.1 kg)   BP Readings from Last 3 Encounters:  10/13/22 120/60  07/08/22 92/68  06/02/22 132/74         Past Medical History:  Diagnosis Date   Allergy    generic allergy pill; Spring and Fall only.   Anxiety    Arthritis    DDD lumbar, R hip OA.  s/p ortho consult in past.   Blood transfusion without reported diagnosis    Mountain climbing accident in [place].   Brachial plexus disorders    Cataract    B retractions.   Chronic kidney disease    stage 3 per pt.    Chronic pain syndrome    Chronic renal insufficiency, stage 3 (moderate) (HCC)    Constipation    DDD (degenerative disc disease), lumbar    Depression    Diabetes mellitus    Diabetic peripheral neuropathy associated with type 2 diabetes mellitus (HCC)    Diabetic retinopathy ([place])    Diabetic retinopathy associated with type 2 diabetes mellitus ([place])    s/p laser treatment multiple.  Unable to drive.   Fatty liver    Fibromyalgia    Food allergy    GERD (gastroesophageal reflux disease)    Hypercholesteremia    Hyperlipidemia    Hypertension    controlled, off meds    IBS (irritable bowel syndrome)    Leg edema    Neuromuscular disorder (HCC)    OSA (obstructive sleep apnea)    Osteoarthritis    Rheumatic fever    Rheumatoid arthritis (HCC)    Stomach ulcer    Swallowing  difficulty    TIA (transient ischemic attack)    Ulcer    Peptic ulcer H. Pylori + s/p treatment.  Upper GI diagnosed.[name] Prilosec PRN .   Past Surgical History:  Procedure Laterality Date    2 SPINAL INJECTIONS      ABDOMINAL HYSTERECTOMY  11/02/1979   DUB; cervical dysplasia; ovaries intact.   ABDOMINAL SURGERY     staph abcess    [facility] Admission     age 10; three months in [place].   BIOPSY  07/26/2020   Procedure: BIOPSY;  Surgeon: Arta Silence, MD;  Location: [facility];  Service: Endoscopy;;   BREAST BIOPSY     CARDIAC CATHETERIZATION  11/02/2007   normal coronary arteries.   CARPAL TUNNEL RELEASE     Bilateral.   CATARACT EXTRACTION, BILATERAL     CHOLECYSTECTOMY     ESOPHAGEAL MANOMETRY N/A 09/14/2017   Procedure: ESOPHAGEAL MANOMETRY (EM);  Surgeon: Ronnette Juniper, MD;  Location: [facility];  Service: Gastroenterology;  Laterality: N/A;   ESOPHAGOGASTRODUODENOSCOPY (EGD) WITH PROPOFOL N/A 07/26/2020   Procedure: ESOPHAGOGASTRODUODENOSCOPY (EGD) WITH PROPOFOL;  Surgeon: Arta Silence, MD;  Location: [facility];  Service: Endoscopy;  Laterality: N/A;   EYE SURGERY     Cataracts B. Laser surgery x 7 for Diabetic Retinopathy   TONSILLECTOMY      reports that she has quit smoking. Her smoking use included cigarettes. She has never used smokeless tobacco. She reports that she does not drink alcohol and does not use drugs. family history includes Breast cancer in her daughter. She was adopted. Allergies  Allergen Reactions   Codeine Anaphylaxis   Iodinated Contrast Media Anaphylaxis    Other reaction(s): respiratory distress   Nitrofurantoin Monohyd Macro Anaphylaxis   Betadine [Povidone Iodine] Itching   Folic Acid Itching   Gabapentin Other (See Comments)    Makes patient feel drunk   Iodine Hives   Lyrica [Pregabalin] Other (See Comments)    Makes patient feel drunk   Red Dye Itching    Other reaction(s): Unknown   Ultram [Tramadol Hcl]  Nausea And Vomiting   Current Outpatient Medications on File Prior to Visit  Medication Sig Dispense Refill   acetaminophen (TYLENOL) 325 MG tablet Take 650 mg by mouth every 6 (six) hours as needed (every 6 weeks before RA infusion).     allopurinol (ZYLOPRIM) 100 MG tablet Take 1 tablet (100 mg total) by mouth daily. Ov needed 90 tablet 3   amLODipine (NORVASC) 2.5 MG tablet Take 2.5 mg by mouth daily in the afternoon.     bacitracin 500 UNIT/GM ointment SMARTSIG:Sparingly Topical As Directed     cholecalciferol (VITAMIN D3) 25 MCG (1000 UNIT) tablet Take 1,000 Units by mouth daily.     clotrimazole-betamethasone (LOTRISONE) cream Apply 1 Application topically 2 (two) times daily. 30 g 0   diclofenac Sodium (VOLTAREN) 1 % GEL APPLY AS DIRECTED FOUR TIMES DAILY AS NEEDED     diphenhydrAMINE (BENADRYL) 25 MG tablet Take 25 mg by mouth every 6 (six) hours as needed (every 6 weeks prior to RA infusion).     escitalopram (LEXAPRO) 20 MG tablet Take by mouth.     HYDROcodone-acetaminophen (NORCO) 10-325 MG tablet Take 1 tablet by mouth every 6 (six) hours as needed.     inFLIXimab (REMICADE) 100 MG injection 10 mg/kg Intravenous 8 weeks     leflunomide (ARAVA) 20 MG tablet 1 tablet Orally Once a day for 30 day(s)     Multiple Vitamin (MULTIVITAMIN WITH MINERALS) TABS tablet Take 1 tablet by mouth daily.     mupirocin ointment (BACTROBAN) 2 % APPLY TO AFFECTED AREA 3 TIMES A DAY     nystatin cream (MYCOSTATIN) Apply topically 2 (two) times daily.     omeprazole (PRILOSEC) 20 MG capsule TAKE 1 CAPSULE BY MOUTH EVERY DAY (Patient taking differently: Take 20 mg by mouth daily.) 90 capsule 3   oxybutynin (DITROPAN XL) 15 MG 24 hr tablet TAKE 1 TABLET BY MOUTH AT BEDTIME (Patient taking differently: Take 15 mg by mouth at bedtime.) 90 tablet 3   rosuvastatin (CRESTOR) 20 MG tablet TAKE 1 TABLET BY MOUTH EVERY DAY 90 tablet 3   traZODone (DESYREL) 100 MG tablet TAKE 2 TABLETS BY MOUTH EVERY DAY AT BEDTIME  (Patient taking differently: Take 200 mg by mouth at bedtime.) 180 tablet 1   Zoster Vaccine Adjuvanted [facility]) injection as directed Intramuscular once then again as directed for 30 days     No current facility-administered medications on file prior to visit.        ROS:  All others reviewed and negative.  Objective        PE:  BP 120/60 (BP Location: Right Arm, Patient Position: Sitting, Cuff Size: Normal)   Pulse 72   Temp 98.9 F (37.2 C) (Oral)   Ht '5\' 2"'$  (1.575 m)   Wt 170 lb (77.1 kg)   BMI 31.09 kg/m                 Constitutional: Pt appears in NAD               HENT: Head: NCAT.                Right Ear: External ear normal.                 Left Ear: External ear normal.                Eyes: . Pupils are equal, round, and reactive to light. Conjunctivae and EOM are normal               Nose: without d/c or deformity               Neck: Neck supple. Gross normal ROM               Cardiovascular: Normal rate and regular rhythm.                 Pulmonary/Chest: Effort normal and breath sounds without rales or wheezing.                Abd:  Soft, NT, ND, + BS, no organomegaly               Neurological: Pt is alert. At baseline orientation, motor grossly intact               Skin: Skin is warm. No rashes, no other new lesions, LE edema - none               Psychiatric: Pt behavior is normal without agitation   Micro: none  Cardiac tracings I have personally interpreted today:  none  Pertinent Radiological findings (summarize): none   Lab Results  Component Value Date   WBC 4.9 12/31/2021   HGB 10.1 (L) 12/31/2021   HCT 30.4 (L) 12/31/2021   PLT 214.0 12/31/2021   GLUCOSE 111 (H) 10/12/2022   CHOL 150 07/02/2022   TRIG 260.0 (H) 07/02/2022   HDL 37.70 (L) 07/02/2022   LDLDIRECT 62.0 07/02/2022   LDLCALC 80 01/02/2019   ALT 13 07/02/2022   AST 18 07/02/2022   NA 139 10/12/2022   K 4.1 10/12/2022   CL 102 10/12/2022   CREATININE 1.46 (H) 10/12/2022   BUN  22 10/12/2022   CO2 29 10/12/2022   TSH 1.01 12/31/2021   INR 0.9 06/23/2020   HGBA1C 5.5 10/13/2022   MICROALBUR 0.9 12/31/2021   HbA1c POC (<> result, manual entry) 4.0 - 5.6 % 5.5   Assessment/Plan:  Angel French is a 71 y.o. White or Caucasian [1] female with  has a past medical history of Allergy, Anxiety, Arthritis, Blood transfusion without reported diagnosis, Brachial plexus disorders, Cataract, Chronic kidney disease, Chronic pain syndrome, Chronic renal insufficiency, stage 3 (moderate) (HCC), Constipation, DDD (degenerative disc disease), lumbar, Depression, Diabetes mellitus, Diabetic peripheral neuropathy associated with type 2 diabetes mellitus ([place]), Diabetic retinopathy ([place]), Diabetic retinopathy associated with type 2 diabetes mellitus ([place]), Fatty liver, Fibromyalgia, Food allergy, GERD (gastroesophageal reflux disease), Hypercholesteremia, Hyperlipidemia, Hypertension, IBS (irritable bowel syndrome), Leg edema,  Neuromuscular disorder ([place]), OSA (obstructive sleep apnea), Osteoarthritis, Rheumatic fever, Rheumatoid arthritis ([place]), Stomach ulcer, Swallowing difficulty, TIA (transient ischemic attack), and Ulcer.  Type 2 diabetes mellitus with diabetic polyneuropathy, with long-term current use of insulin (HCC) Lab Results  Component Value Date   HGBA1C 5.5 10/13/2022   Stable, pt to continue current medical treatment - diet, wt control, excercise   Vitamin D deficiency Last vitamin D Lab Results  Component Value Date   VD25OH 102.37 (HH) 07/02/2022   Stable, cont oral replacement   CKD (chronic kidney disease) stage 3, GFR 30-59 ml/min (HCC) Lab Results  Component Value Date   CREATININE 1.46 (H) 10/12/2022   Stable overall, cont to avoid nephrotoxins, f/u renal as planned   Essential hypertension, benign BP Readings from Last 3 Encounters:  10/13/22 120/60  07/08/22 92/68  06/02/22 132/74   Stable, pt to continue medical treatment norvasc 2.5 mg  qd   Pure hypercholesterolemia Lab Results  Component Value Date   [place] 80 01/02/2019   uncontrolled, pt to continue current statin crestor 20 mg qd, lower chol diet, as declines other change in tx for now  Followup: Return in about 6 months (around 04/14/2023).  Cathlean Cower, MD 10/15/2022 9:06 PM [facility]

## 2022-10-13 NOTE — Patient Instructions (Addendum)
Your A1c was done today - 5.5  Since this is so good, we should hold on any new medication at this time  Please continue all other medications as before, and refills have been done if requested.  Please have the pharmacy call with any other refills you may need.  Please continue your efforts at being more active, low cholesterol diet, and weight control.  Please keep your appointments with your specialists as you may have planned  - Dr Posey Pronto  Please make an Appointment to return in 6 months, or sooner if needed

## 2022-10-15 ENCOUNTER — Encounter: Payer: Self-pay | Admitting: Internal Medicine

## 2022-10-15 NOTE — Assessment & Plan Note (Signed)
Lab Results  Component Value Date   [place] 80 01/02/2019   uncontrolled, pt to continue current statin crestor 20 mg qd, lower chol diet, as declines other change in tx for now

## 2022-10-15 NOTE — Assessment & Plan Note (Signed)
Lab Results  Component Value Date   HGBA1C 5.5 10/13/2022   Stable, pt to continue current medical treatment - diet, wt control, excercise

## 2022-10-15 NOTE — Assessment & Plan Note (Signed)
Last vitamin D Lab Results  Component Value Date   VD25OH 102.37 (HH) 07/02/2022   Stable, cont oral replacement

## 2022-10-15 NOTE — Assessment & Plan Note (Addendum)
Lab Results  Component Value Date   CREATININE 1.46 (H) 10/12/2022   Stable overall, cont to avoid nephrotoxins, f/u renal as planned

## 2022-10-15 NOTE — Assessment & Plan Note (Signed)
BP Readings from Last 3 Encounters:  10/13/22 120/60  07/08/22 92/68  06/02/22 132/74   Stable, pt to continue medical treatment norvasc 2.5 mg qd

## 2023-01-04 ENCOUNTER — Encounter: Payer: Self-pay | Admitting: Podiatry

## 2023-01-04 ENCOUNTER — Encounter: Payer: Self-pay | Admitting: Internal Medicine

## 2023-01-04 ENCOUNTER — Ambulatory Visit (INDEPENDENT_AMBULATORY_CARE_PROVIDER_SITE_OTHER): Payer: Medicare Other | Admitting: Podiatry

## 2023-01-04 VITALS — BP 124/49

## 2023-01-04 DIAGNOSIS — M79675 Pain in left toe(s): Secondary | ICD-10-CM

## 2023-01-04 DIAGNOSIS — B351 Tinea unguium: Secondary | ICD-10-CM | POA: Diagnosis not present

## 2023-01-04 DIAGNOSIS — M79674 Pain in right toe(s): Secondary | ICD-10-CM

## 2023-01-04 DIAGNOSIS — E119 Type 2 diabetes mellitus without complications: Secondary | ICD-10-CM

## 2023-01-04 DIAGNOSIS — Z794 Long term (current) use of insulin: Secondary | ICD-10-CM | POA: Diagnosis not present

## 2023-01-04 DIAGNOSIS — M2012 Hallux valgus (acquired), left foot: Secondary | ICD-10-CM | POA: Diagnosis not present

## 2023-01-04 DIAGNOSIS — E1142 Type 2 diabetes mellitus with diabetic polyneuropathy: Secondary | ICD-10-CM | POA: Diagnosis not present

## 2023-01-04 DIAGNOSIS — M2011 Hallux valgus (acquired), right foot: Secondary | ICD-10-CM | POA: Diagnosis not present

## 2023-01-04 NOTE — Progress Notes (Unsigned)
ANNUAL DIABETIC FOOT EXAM  Subjective: Angel French presents today {jgcomplaint:23593}.  Chief Complaint  Patient presents with   Nail Problem    DFC BS-do not check at the moment A1C-7.2 PCP-[facility] PCP VST-3 months ago    Patient confirms h/o diabetes.  Patient relates {Numbers; 0-100:15068} year h/o diabetes.  Patient denies any h/o foot wounds.  Patient has h/o foot ulcer of {jgPodToeLocator:23637}, which healed via help of ***.  Patient has h/o amputation(s): {jgamp:23617}.  Patient endorses symptoms of foot numbness.   Patient endorses symptoms of foot tingling.  Patient endorses symptoms of burning in feet.  Patient endorses symptoms of pins/needles sensation in feet.  Patient denies any numbness, tingling, burning, or pins/needle sensation in feet.  Patient has been diagnosed with neuropathy and it is managed with {JGNEUROPATHYMEDS:27053}.  Risk factors: {jgriskfactors:24044}.  Angel Borg, MD is patient's PCP. Last visit was {Time; dates multiple:15870}***.  Past Medical History:  Diagnosis Date   Allergy    generic allergy pill; Spring and Fall only.   Anxiety    Arthritis    DDD lumbar, R hip OA.  s/p ortho consult in past.   Blood transfusion without reported diagnosis    Mountain climbing accident in [place].   Brachial plexus disorders    Cataract    B retractions.   Chronic kidney disease    stage 3 per pt.    Chronic pain syndrome    Chronic renal insufficiency, stage 3 (moderate) (HCC)    Constipation    DDD (degenerative disc disease), lumbar    Depression    Diabetes mellitus    Diabetic peripheral neuropathy associated with type 2 diabetes mellitus (HCC)    Diabetic retinopathy ([place])    Diabetic retinopathy associated with type 2 diabetes mellitus ([place])    s/p laser treatment multiple.  Unable to drive.   Fatty liver    Fibromyalgia    Food allergy    GERD (gastroesophageal reflux disease)    Hypercholesteremia     Hyperlipidemia    Hypertension    controlled, off meds    IBS (irritable bowel syndrome)    Leg edema    Neuromuscular disorder (HCC)    OSA (obstructive sleep apnea)    Osteoarthritis    Rheumatic fever    Rheumatoid arthritis (HCC)    Stomach ulcer    Swallowing difficulty    TIA (transient ischemic attack)    Ulcer    Peptic ulcer H. Pylori + s/p treatment.  Upper GI diagnosed.[name] Prilosec PRN .   Patient Active Problem List   Diagnosis Date Noted   Acute cystitis with hematuria 06/02/2022   Intertrigo 06/02/2022   Major depressive disorder, single episode, mild ([place]) 01/12/2022   Encounter for well adult exam with abnormal findings 12/31/2021   CKD (chronic kidney disease) stage 3, GFR 30-59 ml/min ([place]) 12/31/2021   Dysphagia 07/26/2020   Abdominal pain 07/25/2020   Chronic gout due to renal impairment of multiple sites without tophus 02/05/2020   Drug-induced constipation 02/05/2020   Gastroesophageal reflux disease without esophagitis 02/05/2020   Urge incontinence of urine 02/05/2020   Vitamin D deficiency 01/03/2019   Arthritis 10/28/2018   Degenerative scoliosis 10/02/2018   Pain in the coccyx 10/02/2018   Visual changes 04/11/2018   Paresthesia 04/11/2018   OSA (obstructive sleep apnea) 09/09/2017   Nocturnal hypoxemia due to obesity 09/09/2017   Type 2 diabetes mellitus with diabetic polyneuropathy, with long-term current use of insulin ([place]) 09/09/2017  Hypersomnia with sleep apnea 07/21/2017   Elevated blood uric acid level 01/25/2017   Plantar fasciitis of left foot 01/25/2017   Venous stasis 08/23/2016   Rheumatoid arthritis involving both hands with positive rheumatoid factor ([place]) 10/30/2015   Chronic pain syndrome 01/22/2015   Chronic renal insufficiency 01/22/2015   Pure hypercholesterolemia 01/01/2013   Essential hypertension, benign 01/01/2013   Degenerative disc disease, lumbar 01/01/2013   Depression 01/01/2013   Osteoarthritis of right  hip 01/01/2013   Diabetic peripheral neuropathy associated with type 2 diabetes mellitus ([place]) 01/01/2013   Diabetic retinopathy ([place]) 01/01/2013   Obesity 01/01/2013   Past Surgical History:  Procedure Laterality Date    2 SPINAL INJECTIONS      ABDOMINAL HYSTERECTOMY  11/02/1979   DUB; cervical dysplasia; ovaries intact.   ABDOMINAL SURGERY     staph abcess    [facility] Admission     age 89; three months in [place].   BIOPSY  07/26/2020   Procedure: BIOPSY;  Surgeon: Arta Silence, MD;  Location: [facility];  Service: Endoscopy;;   BREAST BIOPSY     CARDIAC CATHETERIZATION  11/02/2007   normal coronary arteries.   CARPAL TUNNEL RELEASE     Bilateral.   CATARACT EXTRACTION, BILATERAL     CHOLECYSTECTOMY     ESOPHAGEAL MANOMETRY N/A 09/14/2017   Procedure: ESOPHAGEAL MANOMETRY (EM);  Surgeon: Ronnette Juniper, MD;  [facility];  Service: Gastroenterology;  Laterality: N/A;   ESOPHAGOGASTRODUODENOSCOPY (EGD) WITH PROPOFOL N/A 07/26/2020   Procedure: ESOPHAGOGASTRODUODENOSCOPY (EGD) WITH PROPOFOL;  Surgeon: Arta Silence, MD;  [facility];  Service: Endoscopy;  Laterality: N/A;   EYE SURGERY     Cataracts B. Laser surgery x 7 for Diabetic Retinopathy   TONSILLECTOMY     Current Outpatient Medications on File Prior to Visit  Medication Sig Dispense Refill   acetaminophen (TYLENOL) 325 MG tablet Take 650 mg by mouth every 6 (six) hours as needed (every 6 weeks before RA infusion).     allopurinol (ZYLOPRIM) 100 MG tablet Take 1 tablet (100 mg total) by mouth daily. Ov needed 90 tablet 3   amLODipine (NORVASC) 2.5 MG tablet Take 2.5 mg by mouth daily in the afternoon.     bacitracin 500 UNIT/GM ointment SMARTSIG:Sparingly Topical As Directed     cholecalciferol (VITAMIN D3) 25 MCG (1000 UNIT) tablet Take 1,000 Units by mouth daily.     clotrimazole-betamethasone (LOTRISONE) cream Apply 1 Application topically 2 (two) times daily. 30 g 0   diclofenac  Sodium (VOLTAREN) 1 % GEL APPLY AS DIRECTED FOUR TIMES DAILY AS NEEDED     diphenhydrAMINE (BENADRYL) 25 MG tablet Take 25 mg by mouth every 6 (six) hours as needed (every 6 weeks prior to RA infusion).     escitalopram (LEXAPRO) 20 MG tablet Take by mouth.     HYDROcodone-acetaminophen (NORCO) 10-325 MG tablet Take 1 tablet by mouth every 6 (six) hours as needed.     inFLIXimab (REMICADE) 100 MG injection 10 mg/kg Intravenous 8 weeks     leflunomide (ARAVA) 20 MG tablet 1 tablet Orally Once a day for 30 day(s)     linaclotide (LINZESS) 145 MCG CAPS capsule Take 1 capsule (145 mcg total) by mouth daily before breakfast. 30 capsule 11   Multiple Vitamin (MULTIVITAMIN WITH MINERALS) TABS tablet Take 1 tablet by mouth daily.     mupirocin ointment (BACTROBAN) 2 % APPLY TO AFFECTED AREA 3 TIMES A DAY     nystatin cream (MYCOSTATIN) Apply topically  2 (two) times daily.     omeprazole (PRILOSEC) 20 MG capsule TAKE 1 CAPSULE BY MOUTH EVERY DAY (Patient taking differently: Take 20 mg by mouth daily.) 90 capsule 3   oxybutynin (DITROPAN XL) 15 MG 24 hr tablet TAKE 1 TABLET BY MOUTH AT BEDTIME (Patient taking differently: Take 15 mg by mouth at bedtime.) 90 tablet 3   rosuvastatin (CRESTOR) 20 MG tablet TAKE 1 TABLET BY MOUTH EVERY DAY 90 tablet 3   traZODone (DESYREL) 100 MG tablet TAKE 2 TABLETS BY MOUTH EVERY DAY AT BEDTIME (Patient taking differently: Take 200 mg by mouth at bedtime.) 180 tablet 1   Zoster Vaccine Adjuvanted [facility]) injection as directed Intramuscular once then again as directed for 30 days     No current facility-administered medications on file prior to visit.    Allergies  Allergen Reactions   Codeine Anaphylaxis   Iodinated Contrast Media Anaphylaxis    Other reaction(s): respiratory distress   Nitrofurantoin Monohyd Macro Anaphylaxis   Betadine [Povidone Iodine] Itching   Folic Acid Itching   Gabapentin Other (See Comments)    Makes patient feel drunk   Iodine Hives    Lyrica [Pregabalin] Other (See Comments)    Makes patient feel drunk   Red Dye Itching    Other reaction(s): Unknown   Ultram [Tramadol Hcl] Nausea And Vomiting   Social History   Occupational History   Occupation: retired    Comment: retretied  Tobacco Use   Smoking status: Former    Years: 22.00    Types: Cigarettes   Smokeless tobacco: Never   Tobacco comments:    Quit 1987  Substance and Sexual Activity   Alcohol use: No    Alcohol/week: 0.0 standard drinks of alcohol   Drug use: No   Sexual activity: Yes    Birth control/protection: Surgical, Post-menopausal    Comment: widow   Family History  Adopted: Yes  Problem Relation Age of Onset   Breast cancer Daughter    Immunization History  Administered Date(s) Administered   Covid-19, Mrna,Vaccine(Spikevax)5yr and older 10/08/2022   Fluad Quad(high Dose 65+) 07/08/2021, 07/08/2022   Hepatitis A, Adult 10/06/2015, 08/12/2016   Influenza, High Dose Seasonal PF 09/15/2018   Influenza,inj,Quad PF,6+ Mos 07/16/2013, 07/10/2014, 10/15/2015, 08/12/2016, 06/28/2017   Influenza-Unspecified 07/16/2013, 07/10/2014, 10/15/2015, 08/12/2016, 06/28/2017   PFIZER Comirnaty(Gray Top)Covid-19 Tri-Sucrose Vaccine 01/10/2020, 02/06/2020, 08/07/2020, 03/05/2021   PFIZER(Purple Top)SARS-COV-2 Vaccination 01/10/2020, 02/06/2020, 08/07/2020, 03/05/2021   Pfizer Covid-19 Vaccine Bivalent Booster 176yr& up 10/13/2021, 05/07/2022   Pneumococcal Conjugate-13 03/23/2017   Pneumococcal Polysaccharide-23 07/10/2014, 10/19/2019, 12/31/2021   Td 01/12/2022   Tdap 10/06/2015   Zoster Recombinat (Shingrix) 02/24/2021, 08/04/2021     Review of Systems: Negative except as noted in the HPI.   Objective: Vitals:   01/04/23 1330  BP: (!) 124/49    Nakira K ANNAI DOEHRINGs a pleasant [age].o. female in NAD. AAO X 3.  Vascular Examination: {jgvascular:23595}  Dermatological Examination: {jgderm:23598}  Neurological  Examination: {jgneuro:23601::"Protective sensation intact 5/5 intact bilaterally with 10g monofilament b/l.","Vibratory sensation intact b/l.","Proprioception intact bilaterally."}  Musculoskeletal Examination: {jgmsk:23600}  Footwear Assessment: Does the patient wear appropriate shoes? {Yes,No}. Does the patient need inserts/orthotics? {Yes,No}.  Lab Results  Component Value Date   HGBA1C 5.5 10/13/2022   No results found. ADA Risk Categorization: Low Risk :  Patient has all of the following: Intact protective sensation No prior foot ulcer  No severe deformity Pedal pulses present  Assessment: 1. Pain due to onychomycosis of toenails of both  feet   2. Hallux valgus, acquired, bilateral   3. Type 2 diabetes mellitus with diabetic polyneuropathy, with long-term current use of insulin ([place])   4. Encounter for diabetic foot exam ([place])     Plan: {jgplan:23602::"-Patient/POA to call should there be question/concern in the interim."} Return in about 3 months (around 04/06/2023).  Marzetta Board, DPM

## 2023-01-10 ENCOUNTER — Other Ambulatory Visit: Payer: Self-pay | Admitting: Internal Medicine

## 2023-01-10 ENCOUNTER — Other Ambulatory Visit (INDEPENDENT_AMBULATORY_CARE_PROVIDER_SITE_OTHER): Payer: Medicare Other

## 2023-01-10 DIAGNOSIS — E538 Deficiency of other specified B group vitamins: Secondary | ICD-10-CM

## 2023-01-10 DIAGNOSIS — E1142 Type 2 diabetes mellitus with diabetic polyneuropathy: Secondary | ICD-10-CM

## 2023-01-10 DIAGNOSIS — E559 Vitamin D deficiency, unspecified: Secondary | ICD-10-CM

## 2023-01-10 DIAGNOSIS — Z794 Long term (current) use of insulin: Secondary | ICD-10-CM | POA: Diagnosis not present

## 2023-01-10 LAB — URINALYSIS, ROUTINE W REFLEX MICROSCOPIC
Bilirubin Urine: NEGATIVE
Ketones, ur: NEGATIVE
Nitrite: POSITIVE — AB
Specific Gravity, Urine: >=1.03 — AB (ref 1.000–1.030)
Urine Glucose: >=1000 — AB
Urobilinogen, UA: 0.2 (ref 0.0–1.0)
pH: 5.5 (ref 5.0–8.0)

## 2023-01-10 LAB — MICROALBUMIN / CREATININE URINE RATIO
Creatinine,U: 138.3 mg/dL
Microalb Creat Ratio: 2.1 mg/g (ref 0.0–30.0)
Microalb, Ur: 2.9 mg/dL — ABNORMAL HIGH (ref 0.0–1.9)

## 2023-01-10 LAB — HEPATIC FUNCTION PANEL
ALT: 15 U/L (ref 0–35)
AST: 27 U/L (ref 0–37)
Albumin: 3.8 g/dL (ref 3.5–5.2)
Alkaline Phosphatase: 55 U/L (ref 39–117)
Bilirubin, Direct: 0.1 mg/dL (ref 0.0–0.3)
Total Bilirubin: 0.5 mg/dL (ref 0.2–1.2)
Total Protein: 7 g/dL (ref 6.0–8.3)

## 2023-01-10 LAB — CBC WITH DIFFERENTIAL/PLATELET
Basophils Absolute: 0 10*3/uL (ref 0.0–0.1)
Basophils Relative: 1 % (ref 0.0–3.0)
Eosinophils Absolute: 0.2 10*3/uL (ref 0.0–0.7)
Eosinophils Relative: 5.9 % — ABNORMAL HIGH (ref 0.0–5.0)
HCT: 32.5 % — ABNORMAL LOW (ref 36.0–46.0)
Hemoglobin: 10.8 g/dL — ABNORMAL LOW (ref 12.0–15.0)
Lymphocytes Relative: 35 % (ref 12.0–46.0)
Lymphs Abs: 1.4 10*3/uL (ref 0.7–4.0)
MCHC: 33.3 g/dL (ref 30.0–36.0)
MCV: 93.5 fl (ref 78.0–100.0)
Monocytes Absolute: 0.3 10*3/uL (ref 0.1–1.0)
Monocytes Relative: 8.5 % (ref 3.0–12.0)
Neutro Abs: 2 10*3/uL (ref 1.4–7.7)
Neutrophils Relative %: 49.6 % (ref 43.0–77.0)
Platelets: 208 10*3/uL (ref 150.0–400.0)
RBC: 3.48 Mil/uL — ABNORMAL LOW (ref 3.87–5.11)
RDW: 15 % (ref 11.5–15.5)
WBC: 4.1 10*3/uL (ref 4.0–10.5)

## 2023-01-10 LAB — BASIC METABOLIC PANEL
BUN: 28 mg/dL — ABNORMAL HIGH (ref 6–23)
CO2: 27 mEq/L (ref 19–32)
Calcium: 9.8 mg/dL (ref 8.4–10.5)
Chloride: 104 mEq/L (ref 96–112)
Creatinine, Ser: 1.49 mg/dL — ABNORMAL HIGH (ref 0.40–1.20)
GFR: 35.24 mL/min — ABNORMAL LOW (ref >60.00)
Glucose, Bld: 75 mg/dL (ref 70–99)
Potassium: 4.4 mEq/L (ref 3.5–5.1)
Sodium: 138 mEq/L (ref 135–145)

## 2023-01-10 LAB — LIPID PANEL
Cholesterol: 117 mg/dL (ref 0–200)
HDL: 39.8 mg/dL (ref >39.00)
LDL Cholesterol: 48 mg/dL (ref 0–99)
NonHDL: 77.5
Total CHOL/HDL Ratio: 3
Triglycerides: 149 mg/dL (ref 0.0–149.0)
VLDL: 29.8 mg/dL (ref 0.0–40.0)

## 2023-01-10 LAB — VITAMIN D 25 HYDROXY (VIT D DEFICIENCY, FRACTURES): VITD: 63.69 ng/mL (ref 30.00–100.00)

## 2023-01-10 LAB — HEMOGLOBIN A1C: Hgb A1c MFr Bld: 5.3 % (ref 4.6–6.5)

## 2023-01-10 LAB — TSH: TSH: 1.21 u[IU]/mL (ref 0.35–5.50)

## 2023-01-10 LAB — VITAMIN B12: Vitamin B-12: 826 pg/mL (ref 211–911)

## 2023-01-12 ENCOUNTER — Ambulatory Visit (INDEPENDENT_AMBULATORY_CARE_PROVIDER_SITE_OTHER): Payer: Medicare Other | Admitting: Internal Medicine

## 2023-01-12 VITALS — BP 136/72 | Ht 62.0 in | Wt 175.4 lb

## 2023-01-12 DIAGNOSIS — I10 Essential (primary) hypertension: Secondary | ICD-10-CM

## 2023-01-12 DIAGNOSIS — E1142 Type 2 diabetes mellitus with diabetic polyneuropathy: Secondary | ICD-10-CM | POA: Diagnosis not present

## 2023-01-12 DIAGNOSIS — N1832 Chronic kidney disease, stage 3b: Secondary | ICD-10-CM | POA: Diagnosis not present

## 2023-01-12 DIAGNOSIS — R300 Dysuria: Secondary | ICD-10-CM

## 2023-01-12 DIAGNOSIS — Z794 Long term (current) use of insulin: Secondary | ICD-10-CM

## 2023-01-12 MED ORDER — FREESTYLE LIBRE 3 READER DEVI: 1.0000 | Freq: Every day | 0 refills | Status: AC

## 2023-01-12 MED ORDER — RYBELSUS 7 MG PO TABS: 7.0000 mg | ORAL_TABLET | Freq: Every day | ORAL | 3 refills | Status: DC

## 2023-01-12 MED ORDER — FREESTYLE LIBRE 3 SENSOR MISC: 11 refills | Status: AC

## 2023-01-12 MED ORDER — LEVOFLOXACIN 500 MG PO TABS: 500.0000 mg | ORAL_TABLET | Freq: Every day | ORAL | 0 refills | Status: AC

## 2023-01-12 NOTE — Progress Notes (Signed)
Patient ID: Angel French, female   DOB: 1951-05-09, 72 y.o.   MRN: [id number]        Chief Complaint: follow up UTI, left ear pain otitis, dm with obesity, and ckd3a       HPI:  Angel French is a 72 y.o. female here with c/o 3 days onset dysuria and mild frequency, Denies urinary symptoms such as flank pain, hematuria or n/v, fever, chills.   Also has mild to mod left ear pain, pressure, popping and crackling sounds  x 1 wk.  .Pt denies chest pain, increased sob or doe, wheezing, orthopnea, PND, increased LE swelling, palpitations, dizziness or syncope.   Pt denies polydipsia, polyuria, or new focal neuro s/s.    Pt denies fever, wt loss, night sweats, loss of appetite, or other constitutional symptoms         Wt Readings from Last 3 Encounters:  01/12/23 175 lb 6 oz (79.5 kg)  10/13/22 170 lb (77.1 kg)  07/08/22 175 lb (79.4 kg)   BP Readings from Last 3 Encounters:  01/12/23 136/72  01/04/23 (!) 124/49  10/13/22 120/60         Past Medical History:  Diagnosis Date   Allergy    generic allergy pill; Spring and Fall only.   Anxiety    Arthritis    DDD lumbar, R hip OA.  s/p ortho consult in past.   Blood transfusion without reported diagnosis    Mountain climbing accident in [place].   Brachial plexus disorders    Cataract    B retractions.   Chronic kidney disease    stage 3 per pt.    Chronic pain syndrome    Chronic renal insufficiency, stage 3 (moderate) (HCC)    Constipation    DDD (degenerative disc disease), lumbar    Depression    Diabetes mellitus    Diabetic peripheral neuropathy associated with type 2 diabetes mellitus (HCC)    Diabetic retinopathy ([place])    Diabetic retinopathy associated with type 2 diabetes mellitus ([place])    s/p laser treatment multiple.  Unable to drive.   Fatty liver    Fibromyalgia    Food allergy    GERD (gastroesophageal reflux disease)    Hypercholesteremia    Hyperlipidemia    Hypertension    controlled, off meds    IBS  (irritable bowel syndrome)    Leg edema    Neuromuscular disorder (HCC)    OSA (obstructive sleep apnea)    Osteoarthritis    Rheumatic fever    Rheumatoid arthritis (HCC)    Stomach ulcer    Swallowing difficulty    TIA (transient ischemic attack)    Ulcer    Peptic ulcer H. Pylori + s/p treatment.  Upper GI diagnosed.[name] Prilosec PRN .   Past Surgical History:  Procedure Laterality Date    2 SPINAL INJECTIONS      ABDOMINAL HYSTERECTOMY  11/02/1979   DUB; cervical dysplasia; ovaries intact.   ABDOMINAL SURGERY     staph abcess    [facility] Admission     age 27; three months in [place].   BIOPSY  07/26/2020   Procedure: BIOPSY;  Surgeon: Arta Silence, MD;  [facility];  Service: Endoscopy;;   BREAST BIOPSY     CARDIAC CATHETERIZATION  11/02/2007   normal coronary arteries.   CARPAL TUNNEL RELEASE     Bilateral.   CATARACT EXTRACTION, BILATERAL     CHOLECYSTECTOMY  ESOPHAGEAL MANOMETRY N/A 09/14/2017   Procedure: ESOPHAGEAL MANOMETRY (EM);  Surgeon: Ronnette Juniper, MD;  [facility];  Service: Gastroenterology;  Laterality: N/A;   ESOPHAGOGASTRODUODENOSCOPY (EGD) WITH PROPOFOL N/A 07/26/2020   Procedure: ESOPHAGOGASTRODUODENOSCOPY (EGD) WITH PROPOFOL;  Surgeon: Arta Silence, MD;  [facility];  Service: Endoscopy;  Laterality: N/A;   EYE SURGERY     Cataracts B. Laser surgery x 7 for Diabetic Retinopathy   TONSILLECTOMY      reports that she has quit smoking. Her smoking use included cigarettes. She has never used smokeless tobacco. She reports that she does not drink alcohol and does not use drugs. family history includes Breast cancer in her daughter. She was adopted. Allergies  Allergen Reactions   Codeine Anaphylaxis   Iodinated Contrast Media Anaphylaxis    Other reaction(s): respiratory distress   Nitrofurantoin Monohyd Macro Anaphylaxis   Betadine [Povidone Iodine] Itching   Folic Acid Itching   Gabapentin Other  (See Comments)    Makes patient feel drunk   Iodine Hives   Lyrica [Pregabalin] Other (See Comments)    Makes patient feel drunk   Red Dye Itching    Other reaction(s): Unknown   Ultram [Tramadol Hcl] Nausea And Vomiting   Current Outpatient Medications on File Prior to Visit  Medication Sig Dispense Refill   acetaminophen (TYLENOL) 325 MG tablet Take 650 mg by mouth every 6 (six) hours as needed (every 6 weeks before RA infusion).     allopurinol (ZYLOPRIM) 100 MG tablet Take 1 tablet (100 mg total) by mouth daily. Ov needed 90 tablet 3   amLODipine (NORVASC) 2.5 MG tablet Take 2.5 mg by mouth daily in the afternoon.     bacitracin 500 UNIT/GM ointment SMARTSIG:Sparingly Topical As Directed     cholecalciferol (VITAMIN D3) 25 MCG (1000 UNIT) tablet Take 1,000 Units by mouth daily.     clotrimazole-betamethasone (LOTRISONE) cream Apply 1 Application topically 2 (two) times daily. 30 g 0   diclofenac Sodium (VOLTAREN) 1 % GEL APPLY AS DIRECTED FOUR TIMES DAILY AS NEEDED     diphenhydrAMINE (BENADRYL) 25 MG tablet Take 25 mg by mouth every 6 (six) hours as needed (every 6 weeks prior to RA infusion).     escitalopram (LEXAPRO) 20 MG tablet Take by mouth.     HYDROcodone-acetaminophen (NORCO) 10-325 MG tablet Take 1 tablet by mouth every 6 (six) hours as needed.     inFLIXimab (REMICADE) 100 MG injection 10 mg/kg Intravenous 8 weeks     leflunomide (ARAVA) 20 MG tablet 1 tablet Orally Once a day for 30 day(s)     linaclotide (LINZESS) 145 MCG CAPS capsule Take 1 capsule (145 mcg total) by mouth daily before breakfast. 30 capsule 11   Multiple Vitamin (MULTIVITAMIN WITH MINERALS) TABS tablet Take 1 tablet by mouth daily.     mupirocin ointment (BACTROBAN) 2 % APPLY TO AFFECTED AREA 3 TIMES A DAY     nystatin cream (MYCOSTATIN) Apply topically 2 (two) times daily.     omeprazole (PRILOSEC) 20 MG capsule TAKE 1 CAPSULE BY MOUTH EVERY DAY (Patient taking differently: Take 20 mg by mouth daily.)  90 capsule 3   oxybutynin (DITROPAN XL) 15 MG 24 hr tablet TAKE 1 TABLET BY MOUTH AT BEDTIME (Patient taking differently: Take 15 mg by mouth at bedtime.) 90 tablet 3   rosuvastatin (CRESTOR) 20 MG tablet TAKE 1 TABLET BY MOUTH EVERY DAY 90 tablet 3   traZODone (DESYREL) 100 MG tablet TAKE 2 TABLETS BY MOUTH  EVERY DAY AT BEDTIME (Patient taking differently: Take 200 mg by mouth at bedtime.) 180 tablet 1   Zoster Vaccine Adjuvanted [facility]) injection as directed Intramuscular once then again as directed for 30 days     No current facility-administered medications on file prior to visit.        ROS:  All others reviewed and negative.  Objective        PE:  BP 136/72   Ht 5\' 2"  (1.575 m)   Wt 175 lb 6 oz (79.5 kg)   BMI 32.08 kg/m                 Constitutional: Pt appears in NAD               HENT: Head: NCAT.                Right Ear: External ear normal.                 Left Ear: External ear normal.  Left TM with mod erythema               Eyes: . Pupils are equal, round, and reactive to light. Conjunctivae and EOM are normal               Nose: without d/c or deformity               Neck: Neck supple. Gross normal ROM               Cardiovascular: Normal rate and regular rhythm.                 Pulmonary/Chest: Effort normal and breath sounds without rales or wheezing.                Abd:  Soft, mild mid abd tender, ND, + BS, no organomegaly               Neurological: Pt is alert. At baseline orientation, motor grossly intact               Skin: Skin is warm. No rashes, no other new lesions, LE edema - none               Psychiatric: Pt behavior is normal without agitation   Micro: none  Cardiac tracings I have personally interpreted today:  none  Pertinent Radiological findings (summarize): none   Lab Results  Component Value Date   WBC 4.1 01/10/2023   HGB 10.8 (L) 01/10/2023   HCT 32.5 (L) 01/10/2023   PLT 208.0 01/10/2023   GLUCOSE 75 01/10/2023   CHOL 117  01/10/2023   TRIG 149.0 01/10/2023   HDL 39.80 01/10/2023   LDLDIRECT 62.0 07/02/2022   LDLCALC 48 01/10/2023   ALT 15 01/10/2023   AST 27 01/10/2023   NA 138 01/10/2023   K 4.4 01/10/2023   CL 104 01/10/2023   CREATININE 1.49 (H) 01/10/2023   BUN 28 (H) 01/10/2023   CO2 27 01/10/2023   TSH 1.21 01/10/2023   INR 0.9 06/23/2020   HGBA1C 5.3 01/10/2023   MICROALBUR 2.9 (H) 01/10/2023   Assessment/Plan:  Angel French is a 72 y.o. White or Caucasian [1] female with  has a past medical history of Allergy, Anxiety, Arthritis, Blood transfusion without reported diagnosis, Brachial plexus disorders, Cataract, Chronic kidney disease, Chronic pain syndrome, Chronic renal insufficiency, stage 3 (moderate) (HCC), Constipation, DDD (degenerative disc disease), lumbar, Depression, Diabetes mellitus, Diabetic peripheral neuropathy  associated with type 2 diabetes mellitus ([place]), Diabetic retinopathy ([place]), Diabetic retinopathy associated with type 2 diabetes mellitus ([place]), Fatty liver, Fibromyalgia, Food allergy, GERD (gastroesophageal reflux disease), Hypercholesteremia, Hyperlipidemia, Hypertension, IBS (irritable bowel syndrome), Leg edema, Neuromuscular disorder ([place]), OSA (obstructive sleep apnea), Osteoarthritis, Rheumatic fever, Rheumatoid arthritis ([place]), Stomach ulcer, Swallowing difficulty, TIA (transient ischemic attack), and Ulcer.  Dysuria Pt with probable uti - for urine cx, also levaquin 500 qd course asd  CKD (chronic kidney disease) stage 3, GFR 30-59 ml/min (HCC) Lab Results  Component Value Date   CREATININE 1.49 (H) 01/10/2023   Stable overall, cont to avoid nephrotoxins  Essential hypertension, benign BP Readings from Last 3 Encounters:  01/12/23 136/72  01/04/23 (!) 124/49  10/13/22 120/60   Stable, pt to continue medical treatment norvasc 2.5 mg qd   Type 2 diabetes mellitus with diabetic polyneuropathy, with long-term current use of insulin (HCC) Lab Results   Component Value Date   HGBA1C 5.3 01/10/2023   Stable but in the face of obesity -, pt to increase current medical treatment to rybelsus 7 mg qd   Followup: Return in about 6 months (around 07/15/2023).  Cathlean Cower, MD 01/15/2023 11:11 AM [facility]

## 2023-01-12 NOTE — Patient Instructions (Addendum)
Please remember to call for your yearly mammogram  Please take all new medication as prescribed  - the levaquin for the left ear and UTI  Ok to increase the Rybelsus 7 mg per day  We sent the [facility] 3 to the pharmacy  Please continue all other medications as before, and refills have been done if requested.  Please have the pharmacy call with any other refills you may need.  Please continue your efforts at being more active, low cholesterol diet, and weight control.  You are otherwise up to date with prevention measures today.  Please keep your appointments with your specialists as you may have planned - Renal in May 2024  Your lab testing was otherwise OK  Please make an Appointment to return in 6 months, or sooner if needed

## 2023-01-15 ENCOUNTER — Encounter: Payer: Self-pay | Admitting: Internal Medicine

## 2023-01-15 DIAGNOSIS — R300 Dysuria: Secondary | ICD-10-CM | POA: Insufficient documentation

## 2023-01-15 NOTE — Assessment & Plan Note (Signed)
Pt with probable uti - for urine cx, also levaquin 500 qd course asd

## 2023-01-15 NOTE — Assessment & Plan Note (Signed)
Lab Results  Component Value Date   CREATININE 1.49 (H) 01/10/2023   Stable overall, cont to avoid nephrotoxins

## 2023-01-15 NOTE — Assessment & Plan Note (Signed)
BP Readings from Last 3 Encounters:  01/12/23 136/72  01/04/23 (!) 124/49  10/13/22 120/60   Stable, pt to continue medical treatment norvasc 2.5 mg qd

## 2023-01-15 NOTE — Assessment & Plan Note (Signed)
Lab Results  Component Value Date   HGBA1C 5.3 01/10/2023   Stable but in the face of obesity -, pt to increase current medical treatment to rybelsus 7 mg qd

## 2023-01-24 ENCOUNTER — Telehealth: Payer: Self-pay | Admitting: Internal Medicine

## 2023-01-24 MED ORDER — RYBELSUS 7 MG PO TABS: 7.0000 mg | ORAL_TABLET | Freq: Every day | ORAL | 3 refills | Status: DC

## 2023-01-24 NOTE — Telephone Encounter (Signed)
Sent refill to [facility]../l;mb

## 2023-01-24 NOTE — Telephone Encounter (Signed)
Pharmacy called and requested the following refill:  Prescription Request  01/24/2023  LOV: 01/12/2023  What is the name of the medication or equipment? Rybelsius  Have you contacted your pharmacy to request a refill? Yes   Which pharmacy would you like this sent to?   [facility] - mail Order - fax #  [phone]   Patient notified that their request is being sent to the clinical staff for review and that they should receive a response within 2 business days.   Please advise at Mobile [phone] (mobile)

## 2023-02-18 LAB — LAB REPORT - SCANNED
EGFR: 39
HM Hepatitis Screen: NEGATIVE

## 2023-03-16 DIAGNOSIS — M84361A Stress fracture, right tibia, initial encounter for fracture: Secondary | ICD-10-CM | POA: Insufficient documentation

## 2023-04-14 ENCOUNTER — Ambulatory Visit (INDEPENDENT_AMBULATORY_CARE_PROVIDER_SITE_OTHER): Payer: Medicare Other | Admitting: Internal Medicine

## 2023-04-14 VITALS — BP 112/78 | HR 68 | Temp 98.7°F | Ht 62.0 in | Wt 172.0 lb

## 2023-04-14 DIAGNOSIS — M1711 Unilateral primary osteoarthritis, right knee: Secondary | ICD-10-CM

## 2023-04-14 DIAGNOSIS — I10 Essential (primary) hypertension: Secondary | ICD-10-CM | POA: Diagnosis not present

## 2023-04-14 DIAGNOSIS — E559 Vitamin D deficiency, unspecified: Secondary | ICD-10-CM | POA: Diagnosis not present

## 2023-04-14 DIAGNOSIS — E7800 Pure hypercholesterolemia, unspecified: Secondary | ICD-10-CM

## 2023-04-14 DIAGNOSIS — E113593 Type 2 diabetes mellitus with proliferative diabetic retinopathy without macular edema, bilateral: Secondary | ICD-10-CM

## 2023-04-14 DIAGNOSIS — Z794 Long term (current) use of insulin: Secondary | ICD-10-CM | POA: Diagnosis not present

## 2023-04-14 DIAGNOSIS — H60502 Unspecified acute noninfective otitis externa, left ear: Secondary | ICD-10-CM

## 2023-04-14 DIAGNOSIS — E1142 Type 2 diabetes mellitus with diabetic polyneuropathy: Secondary | ICD-10-CM | POA: Diagnosis not present

## 2023-04-14 LAB — LIPID PANEL
Cholesterol: 112 mg/dL (ref 0–200)
HDL: 42.9 mg/dL (ref >39.00)
LDL Cholesterol: 50 mg/dL (ref 0–99)
NonHDL: 69.5
Total CHOL/HDL Ratio: 3
Triglycerides: 99 mg/dL (ref 0.0–149.0)
VLDL: 19.8 mg/dL (ref 0.0–40.0)

## 2023-04-14 LAB — CBC WITH DIFFERENTIAL/PLATELET
Basophils Absolute: 0 10*3/uL (ref 0.0–0.1)
Basophils Relative: 0.8 % (ref 0.0–3.0)
Eosinophils Absolute: 0.1 10*3/uL (ref 0.0–0.7)
Eosinophils Relative: 3.2 % (ref 0.0–5.0)
HCT: 29.6 % — ABNORMAL LOW (ref 36.0–46.0)
Hemoglobin: 9.7 g/dL — ABNORMAL LOW (ref 12.0–15.0)
Lymphocytes Relative: 38.2 % (ref 12.0–46.0)
Lymphs Abs: 1.8 10*3/uL (ref 0.7–4.0)
MCHC: 32.8 g/dL (ref 30.0–36.0)
MCV: 94.6 fl (ref 78.0–100.0)
Monocytes Absolute: 0.4 10*3/uL (ref 0.1–1.0)
Monocytes Relative: 7.7 % (ref 3.0–12.0)
Neutro Abs: 2.3 10*3/uL (ref 1.4–7.7)
Neutrophils Relative %: 50.1 % (ref 43.0–77.0)
Platelets: 206 10*3/uL (ref 150.0–400.0)
RBC: 3.14 Mil/uL — ABNORMAL LOW (ref 3.87–5.11)
RDW: 14.5 % (ref 11.5–15.5)
WBC: 4.7 10*3/uL (ref 4.0–10.5)

## 2023-04-14 LAB — BASIC METABOLIC PANEL
BUN: 25 mg/dL — ABNORMAL HIGH (ref 6–23)
CO2: 30 mEq/L (ref 19–32)
Calcium: 9.1 mg/dL (ref 8.4–10.5)
Chloride: 105 mEq/L (ref 96–112)
Creatinine, Ser: 1.57 mg/dL — ABNORMAL HIGH (ref 0.40–1.20)
GFR: 33.04 mL/min — ABNORMAL LOW (ref >60.00)
Glucose, Bld: 79 mg/dL (ref 70–99)
Potassium: 4.9 mEq/L (ref 3.5–5.1)
Sodium: 139 mEq/L (ref 135–145)

## 2023-04-14 LAB — HEMOGLOBIN A1C: Hgb A1c MFr Bld: 5.1 % (ref 4.6–6.5)

## 2023-04-14 LAB — HEPATIC FUNCTION PANEL
ALT: 14 U/L (ref 0–35)
AST: 23 U/L (ref 0–37)
Albumin: 3.9 g/dL (ref 3.5–5.2)
Alkaline Phosphatase: 58 U/L (ref 39–117)
Bilirubin, Direct: 0.1 mg/dL (ref 0.0–0.3)
Total Bilirubin: 0.5 mg/dL (ref 0.2–1.2)
Total Protein: 6.8 g/dL (ref 6.0–8.3)

## 2023-04-14 MED ORDER — DAPAGLIFLOZIN PROPANEDIOL 10 MG PO TABS: 10.0000 mg | ORAL_TABLET | Freq: Every day | ORAL | 3 refills | Status: DC

## 2023-04-14 MED ORDER — RYBELSUS 14 MG PO TABS: 14.0000 mg | ORAL_TABLET | Freq: Every day | ORAL | 3 refills | Status: DC

## 2023-04-14 MED ORDER — CIPROFLOXACIN HCL 500 MG PO TABS: 500.0000 mg | ORAL_TABLET | Freq: Two times a day (BID) | ORAL | 0 refills | Status: AC

## 2023-04-14 MED ORDER — NEOMYCIN-POLYMYXIN-HC 3.5-10000-1 OT SOLN: 4.0000 [drp] | Freq: Four times a day (QID) | OTIC | 0 refills | Status: AC

## 2023-04-14 NOTE — Patient Instructions (Addendum)
Ok to increases the rybelsus to 14 mg per day  Please take all new medication as prescribed - the ear drops and the antibiotic pills  Please continue all other medications as before, and refills have been done if requested. - farxiga  Please have the pharmacy call with any other refills you may need.  Please continue your efforts at being more active, low cholesterol diet, and weight control.  You are otherwise up to date with prevention measures today.  Please keep your appointments with your specialists as you may have planned  You will be contacted regarding the referral for: Orthopedic - murphy wainer  Please go to the LAB at the blood drawing area for the tests to be done  You will be contacted by phone if any changes need to be made immediately.  Otherwise, you will receive a letter about your results with an explanation, but please check with MyChart first.  Please remember to sign up for MyChart if you have not done so, as this will be important to you in the future with finding out test results, communicating by private email, and scheduling acute appointments online when needed.  Please make an Appointment to return in 6 months, or sooner if needed

## 2023-04-14 NOTE — Progress Notes (Signed)
Patient ID: Angel French, female   DOB: 03-26-1951, 72 y.o.   MRN: [id number]        Chief Complaint: follow up left ear pain, dm with obesity, right knee pain, htn, hld       HPI:  Angel French is a 72 y.o. female here with c/o recent worsening right knee pain and felt to be nonsurgical per ortho, pt asking for second opinion due to constant severe.  Also unable to be more active, hard to lose wt, Pt denies chest pain, increased sob or doe, wheezing, orthopnea, PND, increased LE swelling, palpitations, dizziness or syncope.   Pt denies polydipsia, polyuria, or new focal neuro s/s.    Pt denies fever, wt loss, night sweats, loss of appetite, or other constitutional symptoms  Also with 3 days onset left ear pain swelling tender with small d/c        Wt Readings from Last 3 Encounters:  04/14/23 172 lb (78 kg)  01/12/23 175 lb 6 oz (79.5 kg)  10/13/22 170 lb (77.1 kg)   BP Readings from Last 3 Encounters:  04/14/23 112/78  01/12/23 136/72  01/04/23 (!) 124/49         Past Medical History:  Diagnosis Date   Allergy    generic allergy pill; Spring and Fall only.   Anxiety    Arthritis    DDD lumbar, R hip OA.  s/p ortho consult in past.   Blood transfusion without reported diagnosis    Mountain climbing accident in [place].   Brachial plexus disorders    Cataract    B retractions.   Chronic kidney disease    stage 3 per pt.    Chronic pain syndrome    Chronic renal insufficiency, stage 3 (moderate) (HCC)    Constipation    DDD (degenerative disc disease), lumbar    Depression    Diabetes mellitus    Diabetic peripheral neuropathy associated with type 2 diabetes mellitus (HCC)    Diabetic retinopathy (HCC)    Diabetic retinopathy associated with type 2 diabetes mellitus (HCC)    s/p laser treatment multiple.  Unable to drive.   Fatty liver    Fibromyalgia    Food allergy    GERD (gastroesophageal reflux disease)    Hypercholesteremia    Hyperlipidemia    Hypertension     controlled, off meds    IBS (irritable bowel syndrome)    Leg edema    Neuromuscular disorder (HCC)    OSA (obstructive sleep apnea)    Osteoarthritis    Rheumatic fever    Rheumatoid arthritis (HCC)    Stomach ulcer    Swallowing difficulty    TIA (transient ischemic attack)    Ulcer    Peptic ulcer H. Pylori + s/p treatment.  Upper GI diagnosed.Angel French Prilosec PRN .   Past Surgical History:  Procedure Laterality Date    2 SPINAL INJECTIONS      ABDOMINAL HYSTERECTOMY  11/02/1979   DUB; cervical dysplasia; ovaries intact.   ABDOMINAL SURGERY     staph abcess    [facility] Admission     age 31; three months in [place].   BIOPSY  07/26/2020   Procedure: BIOPSY;  Surgeon: Willis Modena, MD;  Location: [facility];  Service: Endoscopy;;   BREAST BIOPSY     CARDIAC CATHETERIZATION  11/02/2007   normal coronary arteries.   CARPAL TUNNEL RELEASE     Bilateral.   CATARACT EXTRACTION, BILATERAL  CHOLECYSTECTOMY     ESOPHAGEAL MANOMETRY N/A 09/14/2017   Procedure: ESOPHAGEAL MANOMETRY (EM);  Surgeon: Kerin Salen, MD;  Location: [facility];  Service: Gastroenterology;  Laterality: N/A;   ESOPHAGOGASTRODUODENOSCOPY (EGD) WITH PROPOFOL N/A 07/26/2020   Procedure: ESOPHAGOGASTRODUODENOSCOPY (EGD) WITH PROPOFOL;  Surgeon: Willis Modena, MD;  Location: [facility];  Service: Endoscopy;  Laterality: N/A;   EYE SURGERY     Cataracts B. Laser surgery x 7 for Diabetic Retinopathy   TONSILLECTOMY      reports that she has quit smoking. Her smoking use included cigarettes. She has never used smokeless tobacco. She reports that she does not drink alcohol and does not use drugs. family history includes Breast cancer in her daughter. She was adopted. Allergies  Allergen Reactions   Codeine Anaphylaxis   Iodinated Contrast Media Anaphylaxis    Other reaction(s): respiratory distress   Nitrofurantoin Monohyd Macro Anaphylaxis   Betadine [Povidone Iodine] Itching   Folic Acid  Itching   Gabapentin Other (See Comments)    Makes patient feel drunk   Iodine Hives   Lyrica [Pregabalin] Other (See Comments)    Makes patient feel drunk   Red Dye Itching    Other reaction(s): Unknown   Ultram [Tramadol Hcl] Nausea And Vomiting   Current Outpatient Medications on File Prior to Visit  Medication Sig Dispense Refill   acetaminophen (TYLENOL) 325 MG tablet Take 650 mg by mouth every 6 (six) hours as needed (every 6 weeks before RA infusion).     allopurinol (ZYLOPRIM) 100 MG tablet Take 1 tablet (100 mg total) by mouth daily. Ov needed 90 tablet 3   amLODipine (NORVASC) 2.5 MG tablet Take 2.5 mg by mouth daily in the afternoon.     bacitracin 500 UNIT/GM ointment SMARTSIG:Sparingly Topical As Directed     cholecalciferol (VITAMIN D3) 25 MCG (1000 UNIT) tablet Take 1,000 Units by mouth daily.     clotrimazole-betamethasone (LOTRISONE) cream Apply 1 Application topically 2 (two) times daily. 30 g 0   Continuous Blood Gluc Receiver (FREESTYLE LIBRE 3 READER) DEVI 1 Device by Does not apply route daily. 1 each 0   Continuous Blood Gluc Sensor (FREESTYLE LIBRE 3 SENSOR) MISC Place 1 sensor on the skin every 14 days. Use to check glucose continuously E11.9 2 each 11   diclofenac Sodium (VOLTAREN) 1 % GEL APPLY AS DIRECTED FOUR TIMES DAILY AS NEEDED     diphenhydrAMINE (BENADRYL) 25 MG tablet Take 25 mg by mouth every 6 (six) hours as needed (every 6 weeks prior to RA infusion).     escitalopram (LEXAPRO) 20 MG tablet Take by mouth.     HYDROcodone-acetaminophen (NORCO) 10-325 MG tablet Take 1 tablet by mouth every 6 (six) hours as needed.     inFLIXimab (REMICADE) 100 MG injection 10 mg/kg Intravenous 8 weeks     leflunomide (ARAVA) 20 MG tablet 1 tablet Orally Once a day for 30 day(s)     linaclotide (LINZESS) 145 MCG CAPS capsule Take 1 capsule (145 mcg total) by mouth daily before breakfast. 30 capsule 11   Multiple Vitamin (MULTIVITAMIN WITH MINERALS) TABS tablet Take 1  tablet by mouth daily.     mupirocin ointment (BACTROBAN) 2 % APPLY TO AFFECTED AREA 3 TIMES A DAY     nystatin cream (MYCOSTATIN) Apply topically 2 (two) times daily.     omeprazole (PRILOSEC) 20 MG capsule TAKE 1 CAPSULE BY MOUTH EVERY DAY (Patient taking differently: Take 20 mg by mouth daily.) 90 capsule 3   oxybutynin (  DITROPAN XL) 15 MG 24 hr tablet TAKE 1 TABLET BY MOUTH AT BEDTIME (Patient taking differently: Take 15 mg by mouth at bedtime.) 90 tablet 3   rosuvastatin (CRESTOR) 20 MG tablet TAKE 1 TABLET BY MOUTH EVERY DAY 90 tablet 3   traZODone (DESYREL) 100 MG tablet TAKE 2 TABLETS BY MOUTH EVERY DAY AT BEDTIME (Patient taking differently: Take 200 mg by mouth at bedtime.) 180 tablet 1   No current facility-administered medications on file prior to visit.        ROS:  All others reviewed and negative.  Objective        PE:  BP 112/78 (BP Location: Left Arm, Patient Position: Sitting, Cuff Size: Normal)   Pulse 68   Temp 98.7 F (37.1 C) (Oral)   Ht 5\' 2"  (1.575 m)   Wt 172 lb (78 kg)   SpO2 97%   BMI 31.46 kg/m                 Constitutional: Pt appears in NAD               HENT: Head: NCAT.                Right Ear: External ear normal.                 Left Ear: left canal with 1-2+ red, tender swelling small d/c               Eyes: . Pupils are equal, round, and reactive to light. Conjunctivae and EOM are normal               Nose: without d/c or deformity               Neck: Neck supple. Gross normal ROM               Cardiovascular: Normal rate and regular rhythm.                 Pulmonary/Chest: Effort normal and breath sounds without rales or wheezing.                Abd:  Soft, NT, ND, + BS, no organomegaly               Neurological: Pt is alert. At baseline orientation, motor grossly intact               Skin: Skin is warm. No rashes, no other new lesions, LE edema - none, right knee with marked degnerative changes, small effusion               Psychiatric: Pt  behavior is normal without agitation   Micro: none  Cardiac tracings I have personally interpreted today:  none  Pertinent Radiological findings (summarize): none   Lab Results  Component Value Date   WBC 4.7 04/14/2023   HGB 9.7 (L) 04/14/2023   HCT 29.6 (L) 04/14/2023   PLT 206.0 04/14/2023   GLUCOSE 79 04/14/2023   CHOL 112 04/14/2023   TRIG 99.0 04/14/2023   HDL 42.90 04/14/2023   LDLDIRECT 62.0 07/02/2022   LDLCALC 50 04/14/2023   ALT 14 04/14/2023   AST 23 04/14/2023   NA 139 04/14/2023   K 4.9 04/14/2023   CL 105 04/14/2023   CREATININE 1.57 (H) 04/14/2023   BUN 25 (H) 04/14/2023   CO2 30 04/14/2023   TSH 1.21 01/10/2023   INR 0.9 06/23/2020   HGBA1C 5.1 04/14/2023  MICROALBUR 2.9 (H) 01/10/2023   Assessment/Plan:  WINSOME PROEFROCK is a 72 y.o. White or Caucasian [1] female with  has a past medical history of Allergy, Anxiety, Arthritis, Blood transfusion without reported diagnosis, Brachial plexus disorders, Cataract, Chronic kidney disease, Chronic pain syndrome, Chronic renal insufficiency, stage 3 (moderate) (HCC), Constipation, DDD (degenerative disc disease), lumbar, Depression, Diabetes mellitus, Diabetic peripheral neuropathy associated with type 2 diabetes mellitus (HCC), Diabetic retinopathy (HCC), Diabetic retinopathy associated with type 2 diabetes mellitus (HCC), Fatty liver, Fibromyalgia, Food allergy, GERD (gastroesophageal reflux disease), Hypercholesteremia, Hyperlipidemia, Hypertension, IBS (irritable bowel syndrome), Leg edema, Neuromuscular disorder (HCC), OSA (obstructive sleep apnea), Osteoarthritis, Rheumatic fever, Rheumatoid arthritis (HCC), Stomach ulcer, Swallowing difficulty, TIA (transient ischemic attack), and Ulcer.  Pure hypercholesterolemia Lab Results  Component Value Date   LDLCALC 50 04/14/2023   Stable, pt to continue current statin crestor 20 qd   Essential hypertension, benign BP Readings from Last 3 Encounters:  04/14/23  112/78  01/12/23 136/72  01/04/23 (!) 124/49   Stable, pt to continue medical treatment norvasc 2.5 qd   Type 2 diabetes mellitus with diabetic polyneuropathy, with long-term current use of insulin (HCC) Lab Results  Component Value Date   HGBA1C 5.1 04/14/2023   With uncontrolled wt, pt to continue current medical treatment farxiga 10 every day and increased rybelsus 14 qd   Vitamin D deficiency Last vitamin D Lab Results  Component Value Date   VD25OH 63.69 01/10/2023   Stable, cont oral replacement   Arthritis of right knee Ok for referral ortho second opionion  External otitis of left ear Mod to severe, for antibx ear drop cortisporin, and cipro po bid course,  to f/u any worsening symptoms or concerns  Followup: Return in about 6 months (around 10/14/2023).  Oliver Barre, MD 04/16/2023 3:29 PM [facility]

## 2023-04-16 ENCOUNTER — Encounter: Payer: Self-pay | Admitting: Internal Medicine

## 2023-04-16 DIAGNOSIS — H6092 Unspecified otitis externa, left ear: Secondary | ICD-10-CM | POA: Insufficient documentation

## 2023-04-16 DIAGNOSIS — M1711 Unilateral primary osteoarthritis, right knee: Secondary | ICD-10-CM | POA: Insufficient documentation

## 2023-04-16 NOTE — Assessment & Plan Note (Signed)
BP Readings from Last 3 Encounters:  04/14/23 112/78  01/12/23 136/72  01/04/23 (!) 124/49   Stable, pt to continue medical treatment norvasc 2.5 qd

## 2023-04-16 NOTE — Assessment & Plan Note (Signed)
Lab Results  Component Value Date   HGBA1C 5.1 04/14/2023   With uncontrolled wt, pt to continue current medical treatment farxiga 10 every day and increased rybelsus 14 qd

## 2023-04-16 NOTE — Assessment & Plan Note (Signed)
Ok for referral ortho second opionion

## 2023-04-16 NOTE — Assessment & Plan Note (Signed)
Last vitamin D Lab Results  Component Value Date   VD25OH 63.69 01/10/2023   Stable, cont oral replacement

## 2023-04-16 NOTE — Assessment & Plan Note (Signed)
Lab Results  Component Value Date   LDLCALC 50 04/14/2023   Stable, pt to continue current statin crestor 20 qd

## 2023-04-16 NOTE — Assessment & Plan Note (Signed)
Mod to severe, for antibx ear drop cortisporin, and cipro po bid course,  to f/u any worsening symptoms or concerns

## 2023-04-27 ENCOUNTER — Telehealth: Payer: Self-pay | Admitting: Internal Medicine

## 2023-04-27 NOTE — Telephone Encounter (Signed)
Pt called wanting to let Dr. Jonny Ruiz know she finish her antibiotic and she still having trouble with her ear. Pt wanted to know if she can be prescribe something else. Please advise.

## 2023-04-28 NOTE — Telephone Encounter (Signed)
If no fever, ok to work on contd mucinex up to 1200 mg twice per day to break up the congestion pressure and pain   thanks

## 2023-04-29 NOTE — Telephone Encounter (Signed)
Called and let Pt know and she states understanding.

## 2023-05-04 ENCOUNTER — Encounter: Payer: Self-pay | Admitting: Podiatry

## 2023-05-04 ENCOUNTER — Ambulatory Visit (INDEPENDENT_AMBULATORY_CARE_PROVIDER_SITE_OTHER): Payer: Medicare Other | Admitting: Podiatry

## 2023-05-04 DIAGNOSIS — M79674 Pain in right toe(s): Secondary | ICD-10-CM

## 2023-05-04 DIAGNOSIS — E1142 Type 2 diabetes mellitus with diabetic polyneuropathy: Secondary | ICD-10-CM | POA: Diagnosis not present

## 2023-05-04 DIAGNOSIS — M79675 Pain in left toe(s): Secondary | ICD-10-CM

## 2023-05-04 DIAGNOSIS — Z794 Long term (current) use of insulin: Secondary | ICD-10-CM

## 2023-05-04 DIAGNOSIS — B351 Tinea unguium: Secondary | ICD-10-CM

## 2023-05-04 NOTE — Progress Notes (Signed)

## 2023-05-23 ENCOUNTER — Telehealth: Payer: Self-pay | Admitting: Internal Medicine

## 2023-05-23 DIAGNOSIS — Z5941 Food insecurity: Secondary | ICD-10-CM

## 2023-05-23 DIAGNOSIS — Z780 Asymptomatic menopausal state: Secondary | ICD-10-CM

## 2023-05-23 NOTE — Telephone Encounter (Signed)
Patient called and states that she can not afford the rybelsius or the farxiga.  What should she do?  Please call patient and advise.  [phone]

## 2023-05-24 MED ORDER — REPAGLINIDE 1 MG PO TABS: 1.0000 mg | ORAL_TABLET | Freq: Three times a day (TID) | ORAL | 1 refills | Status: DC

## 2023-05-24 NOTE — Telephone Encounter (Signed)
Ok to change to generic Prandin 1 mg tid  - done erx  Ok to stay off the rybelsus and farxiga for now    thanks

## 2023-05-24 NOTE — Telephone Encounter (Signed)
Called and let Pt know

## 2023-06-06 ENCOUNTER — Encounter: Payer: Self-pay | Admitting: Internal Medicine

## 2023-06-06 ENCOUNTER — Ambulatory Visit (INDEPENDENT_AMBULATORY_CARE_PROVIDER_SITE_OTHER): Payer: Medicare Other

## 2023-06-06 VITALS — Ht 62.5 in | Wt 167.0 lb

## 2023-06-06 DIAGNOSIS — Z780 Asymptomatic menopausal state: Secondary | ICD-10-CM | POA: Diagnosis not present

## 2023-06-06 DIAGNOSIS — Z0000 Encounter for general adult medical examination without abnormal findings: Secondary | ICD-10-CM | POA: Diagnosis not present

## 2023-06-06 DIAGNOSIS — Z1231 Encounter for screening mammogram for malignant neoplasm of breast: Secondary | ICD-10-CM

## 2023-06-06 NOTE — Progress Notes (Signed)
Subjective:   Angel French is a 72 y.o. female who presents for Medicare Annual (Subsequent) preventive examination.  Visit Complete: Virtual  I connected with  Angel French on 06/06/23 by a audio enabled telemedicine application and verified that I am speaking with the correct person using two identifiers.  Patient Location: Home  Provider Location: Office/Clinic  I discussed the limitations of evaluation and management by telemedicine. The patient expressed understanding and agreed to proceed.  Vital Signs: Per patient no change in vitals since last visit.   Review of Systems    Cardiac Risk Factors include: advanced age (>103men, >48 women);hypertension;diabetes mellitus;Other (see comment);dyslipidemia, Risk factor comments: OSA, CKD     Objective:    Today's Vitals   06/06/23 1303  Weight: 167 lb (75.8 kg)  Height: 5' 2.5" (1.588 m)   Body mass index is 30.06 kg/m.     06/06/2023    1:15 PM 06/03/2022    4:13 PM 08/28/2020    9:17 AM 07/25/2020    9:26 AM 06/23/2020    6:45 AM 02/22/2020   12:07 AM 08/05/2017    9:14 PM  Advanced Directives  Does Patient Have a Medical Advance Directive? Yes Yes Yes No No No No  Type of Estate agent of [place];Living will        Does patient want to make changes to medical advance directive?  No - Patient declined No - Patient declined      Copy of Healthcare Power of Attorney in Chart? No - copy requested        Would patient like information on creating a medical advance directive?    No - Patient declined No - Patient declined No - Patient declined     Current Medications (verified) Outpatient Encounter Medications as of 06/06/2023  Medication Sig   acetaminophen (TYLENOL) 325 MG tablet Take 650 mg by mouth every 6 (six) hours as needed (every 6 weeks before RA infusion).   allopurinol (ZYLOPRIM) 100 MG tablet Take 1 tablet (100 mg total) by mouth daily. Ov needed   amLODipine (NORVASC) 2.5 MG tablet Take  2.5 mg by mouth daily in the afternoon.   cholecalciferol (VITAMIN D3) 25 MCG (1000 UNIT) tablet Take 1,000 Units by mouth daily.   diclofenac Sodium (VOLTAREN) 1 % GEL APPLY AS DIRECTED FOUR TIMES DAILY AS NEEDED   diphenhydrAMINE (BENADRYL) 25 MG tablet Take 25 mg by mouth every 6 (six) hours as needed (every 6 weeks prior to RA infusion).   escitalopram (LEXAPRO) 20 MG tablet Take by mouth.   HYDROcodone-acetaminophen (NORCO) 10-325 MG tablet Take 1 tablet by mouth every 6 (six) hours as needed.   leflunomide (ARAVA) 20 MG tablet 1 tablet Orally Once a day for 30 day(s)   Multiple Vitamin (MULTIVITAMIN WITH MINERALS) TABS tablet Take 1 tablet by mouth daily.   mupirocin ointment (BACTROBAN) 2 % APPLY TO AFFECTED AREA 3 TIMES A DAY   nystatin cream (MYCOSTATIN) Apply topically 2 (two) times daily.   omeprazole (PRILOSEC) 20 MG capsule TAKE 1 CAPSULE BY MOUTH EVERY DAY (Patient taking differently: Take 20 mg by mouth daily.)   repaglinide (PRANDIN) 1 MG tablet Take 1 tablet (1 mg total) by mouth 3 (three) times daily before meals.   rosuvastatin (CRESTOR) 20 MG tablet TAKE 1 TABLET BY MOUTH EVERY DAY   traZODone (DESYREL) 100 MG tablet TAKE 2 TABLETS BY MOUTH EVERY DAY AT BEDTIME (Patient taking differently: Take 200 mg by mouth at bedtime.)  bacitracin 500 UNIT/GM ointment SMARTSIG:Sparingly Topical As Directed (Patient not taking: Reported on 06/06/2023)   clotrimazole-betamethasone (LOTRISONE) cream Apply 1 Application topically 2 (two) times daily. (Patient not taking: Reported on 06/06/2023)   Continuous Blood Gluc Receiver (FREESTYLE LIBRE 3 READER) DEVI 1 Device by Does not apply route daily. (Patient not taking: Reported on 06/06/2023)   Continuous Blood Gluc Sensor (FREESTYLE LIBRE 3 SENSOR) MISC Place 1 sensor on the skin every 14 days. Use to check glucose continuously E11.9 (Patient not taking: Reported on 06/06/2023)   inFLIXimab (REMICADE) 100 MG injection 10 mg/kg Intravenous 8 weeks  (Patient not taking: Reported on 06/06/2023)   linaclotide (LINZESS) 145 MCG CAPS capsule Take 1 capsule (145 mcg total) by mouth daily before breakfast. (Patient not taking: Reported on 06/06/2023)   neomycin-polymyxin-hydrocortisone (CORTISPORIN) OTIC solution Place 4 drops into the left ear 4 (four) times daily. (Patient not taking: Reported on 06/06/2023)   oxybutynin (DITROPAN XL) 15 MG 24 hr tablet TAKE 1 TABLET BY MOUTH AT BEDTIME (Patient not taking: Reported on 06/06/2023)   No facility-administered encounter medications on file as of 06/06/2023.    Allergies (verified) Codeine, Iodinated contrast media, Nitrofurantoin monohyd macro, Betadine [povidone iodine], Folic acid, Gabapentin, Iodine, Lyrica [pregabalin], Red dye, and Ultram [tramadol hcl]   History: Past Medical History:  Diagnosis Date   Allergy    generic allergy pill; Spring and Fall only.   Anxiety    Arthritis    DDD lumbar, R hip OA.  s/p ortho consult in past.   Blood transfusion without reported diagnosis    Mountain climbing accident in [place].   Brachial plexus disorders    Cataract    B retractions.   Chronic kidney disease    stage 3 per pt.    Chronic pain syndrome    Chronic renal insufficiency, stage 3 (moderate) (HCC)    Constipation    DDD (degenerative disc disease), lumbar    Depression    Diabetes mellitus    Diabetic peripheral neuropathy associated with type 2 diabetes mellitus (HCC)    Diabetic retinopathy (HCC)    Diabetic retinopathy associated with type 2 diabetes mellitus (HCC)    s/p laser treatment multiple.  Unable to drive.   Fatty liver    Fibromyalgia    Food allergy    GERD (gastroesophageal reflux disease)    Hypercholesteremia    Hyperlipidemia    Hypertension    controlled, off meds    IBS (irritable bowel syndrome)    Leg edema    Neuromuscular disorder (HCC)    OSA (obstructive sleep apnea)    Osteoarthritis    Rheumatic fever    Rheumatoid arthritis (HCC)    Stomach  ulcer    Swallowing difficulty    TIA (transient ischemic attack)    Ulcer    Peptic ulcer H. Pylori + s/p treatment.  Upper GI diagnosed.[name] Prilosec PRN .   Past Surgical History:  Procedure Laterality Date    2 SPINAL INJECTIONS      ABDOMINAL HYSTERECTOMY  11/02/1979   DUB; cervical dysplasia; ovaries intact.   ABDOMINAL SURGERY     staph abcess    [facility] Admission     age 53; three months in [place].   BIOPSY  07/26/2020   Procedure: BIOPSY;  Surgeon: Willis Modena, MD;  Location: [facility];  Service: Endoscopy;;   BREAST BIOPSY     CARDIAC CATHETERIZATION  11/02/2007   normal coronary arteries.   CARPAL TUNNEL  RELEASE     Bilateral.   CATARACT EXTRACTION, BILATERAL     CHOLECYSTECTOMY     ESOPHAGEAL MANOMETRY N/A 09/14/2017   Procedure: ESOPHAGEAL MANOMETRY (EM);  Surgeon: Kerin Salen, MD;  Location: [facility];  Service: Gastroenterology;  Laterality: N/A;   ESOPHAGOGASTRODUODENOSCOPY (EGD) WITH PROPOFOL N/A 07/26/2020   Procedure: ESOPHAGOGASTRODUODENOSCOPY (EGD) WITH PROPOFOL;  Surgeon: Willis Modena, MD;  Location: [facility];  Service: Endoscopy;  Laterality: N/A;   EYE SURGERY     Cataracts B. Laser surgery x 7 for Diabetic Retinopathy   TONSILLECTOMY     Family History  Adopted: Yes  Problem Relation Age of Onset   Breast cancer Daughter    Social History   Socioeconomic History   Marital status: Married    Spouse name: Quarry manager   Number of children: 2   Years of education: college   Highest education level: Not on file  Occupational History   Occupation: retired    Comment: retretied  Tobacco Use   Smoking status: Former    Types: Cigarettes   Smokeless tobacco: Never   Tobacco comments:    Quit 1987  Vaping Use   Vaping status: Never Used  Substance and Sexual Activity   Alcohol use: No    Alcohol/week: 0.0 standard drinks of alcohol   Drug use: No   Sexual activity: Yes    Birth control/protection: Surgical,  Post-menopausal    Comment: widow  Other Topics Concern   Not on file  Social History Narrative   Marital status: widowed since 2009; dating x 6 years.  Happy; no abuse.      Children: 2 children (35 daughter, 41 son estranged); 2 grandchildren.      Lives: with boyfriend, daughter, granddaughter, friend of daughter.  Lives in pt house.      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      Tobacco:  Smoked x 20 years; quit 20 years.      Alcohol:  On special occasions; once per week on average.       Drugs:  None since college.      Exercise:  Walking several times per week; walks the dog.   Education college   Caffeine one cup daily.   Right handed      Advanced Directives: none; FULL CODE.  DNR/DNI.  HCPOA: Victorino Dike?           Social Determinants of Health   Financial Resource Strain: High Risk (06/06/2023)   Overall Financial Resource Strain (CARDIA)    Difficulty of Paying Living Expenses: Hard  Food Insecurity: Food Insecurity Present (06/06/2023)   Hunger Vital Sign    Worried About Running Out of Food in the Last Year: Often true    Ran Out of Food in the Last Year: Often true  Transportation Needs: No Transportation Needs (06/06/2023)   PRAPARE - Administrator, Civil Service (Medical): No    Lack of Transportation (Non-Medical): No  Physical Activity: Unknown (06/06/2023)   Exercise Vital Sign    Days of Exercise per Week: 7 days    Minutes of Exercise per Session: Not on file  Stress: No Stress Concern Present (06/06/2023)   [employer] of Occupational Health - Occupational Stress Questionnaire    Feeling of Stress : Only a little  Social Connections: Socially Integrated (06/06/2023)   Social Connection and Isolation Panel [NHANES]    Frequency of Communication with Friends and  Family: More than three times a week    Frequency of Social Gatherings with Friends and Family: Once a week    Attends  Religious Services: More than 4 times per year    Active Member of [employer] or Organizations: Yes    Attends Banker Meetings: 1 to 4 times per year    Marital Status: Married    Tobacco Counseling Counseling given: Not Answered Tobacco comments: Quit 1987   Clinical Intake:  Pre-visit preparation completed: Yes        BMI - recorded: 30.06 Diabetes: Yes CBG done?: No Did pt. bring in CBG monitor from home?: No  How often do you need to have someone help you when you read instructions, pamphlets, or other written materials from your doctor or pharmacy?: 1 - Never  Interpreter Needed?: No  Information entered by ::  , RMA   Activities of Daily Living    06/06/2023    1:05 PM  In your present state of health, do you have any difficulty performing the following activities:  Hearing? 1  Comment Hard of hearing-per patient  Vision? 0  Difficulty concentrating or making decisions? 0  Walking or climbing stairs? 1  Comment sometimes-back issues  Dressing or bathing? 0  Doing errands, shopping? 1  Comment does not drive  Preparing Food and eating ? N  Using the Toilet? N  In the past six months, have you accidently leaked urine? Y  Do you have problems with loss of bowel control? N  Managing your Medications? N  Managing your Finances? N  Housekeeping or managing your Housekeeping? N    Patient Care Team: Corwin Levins, MD as PCP - General (Internal Medicine) O'Neal, Ronnald Ramp, MD as PCP - Cardiology (Cardiology)  Indicate any recent Medical Services you may have received from other than [facility] providers in the past year (date may be approximate).     Assessment:   This is a routine wellness examination for Jamilex.  Hearing/Vision screen Hearing Screening - Comments:: Hard of hearing Vision Screening - Comments:: Wears eyeglasses  Dietary issues and exercise activities discussed:     Goals Addressed               This Visit's  Progress     Patient Stated (pt-stated)        Would to lose about 20 lbs.      Depression Screen    06/06/2023    1:21 PM 04/14/2023    1:27 PM 01/12/2023    2:25 PM 10/13/2022    2:01 PM 06/02/2022    9:53 AM 12/31/2021    1:40 PM 12/31/2021    1:18 PM  PHQ 2/9 Scores  PHQ - 2 Score 0 0 0 0 0 0 0  PHQ- 9 Score 0   0 3      Fall Risk    06/06/2023    1:15 PM 04/14/2023    1:26 PM 01/12/2023    2:25 PM 10/13/2022    2:01 PM 07/08/2022    1:21 PM  Fall Risk   Falls in the past year? 1 1 1  0 0  Number falls in past yr: 1 0 0 0 0  Injury with Fall? 1 0 0 0 0  Comment  torn meninges     Risk for fall due to : History of fall(s);Impaired balance/gait;Orthopedic patient Impaired balance/gait;Other (Comment)   No Fall Risks  Follow up Education provided;Falls prevention discussed;Falls evaluation completed Falls evaluation completed  Falls evaluation completed Falls evaluation completed    MEDICARE RISK AT HOME:  Medicare Risk at Home - 06/06/23 1316     Any stairs in or around the home? No    Home free of loose throw rugs in walkways, pet beds, electrical cords, etc? Yes    Adequate lighting in your home to reduce risk of falls? Yes    Life alert? No    Use of a cane, walker or w/c? Yes    Grab bars in the bathroom? Yes    Shower chair or bench in shower? Yes    Elevated toilet seat or a handicapped toilet? Yes             TIMED UP AND GO:  Was the test performed?  No    Cognitive Function:        06/06/2023    1:17 PM  6CIT Screen  What Year? 0 points  What month? 0 points  What time? 0 points  Count back from 20 0 points  Months in reverse 0 points  Repeat phrase 0 points  Total Score 0 points    Immunizations Immunization History  Administered Date(s) Administered   Covid-19, Mrna,Vaccine(Spikevax)34yrs and older 10/08/2022, 04/08/2023   Fluad Quad(high Dose 65+) 07/08/2021, 07/08/2022   Hepatitis A, Adult 10/06/2015, 08/12/2016   Influenza, High Dose  Seasonal PF 09/15/2018   Influenza,inj,Quad PF,6+ Mos 07/16/2013, 07/10/2014, 10/15/2015, 08/12/2016, 06/28/2017   Influenza-Unspecified 07/16/2013, 07/10/2014, 10/15/2015, 08/12/2016, 06/28/2017   PFIZER(Purple Top)SARS-COV-2 Vaccination 01/10/2020, 02/06/2020, 08/07/2020, 03/05/2021   Pfizer Covid-19 Vaccine Bivalent Booster 51yrs & up 10/13/2021, 05/07/2022   Pneumococcal Conjugate-13 03/23/2017   Pneumococcal Polysaccharide-23 07/10/2014, 10/19/2019, 12/31/2021   Td 01/12/2022   Tdap 10/06/2015   Zoster Recombinant(Shingrix) 02/24/2021, 08/04/2021    TDAP status: Up to date  Flu Vaccine status: Up to date  Pneumococcal vaccine status: Up to date  Covid-19 vaccine status: Completed vaccines  Qualifies for Shingles Vaccine? Yes   Zostavax completed Yes   Shingrix Completed?: Yes  Screening Tests Health Maintenance  Topic Date Due   MAMMOGRAM  09/07/2022   COVID-19 Vaccine (9 - 2023-24 season) 06/03/2023   INFLUENZA VACCINE  06/02/2023   HEMOGLOBIN A1C  10/14/2023   Diabetic kidney evaluation - Urine ACR  01/10/2024   FOOT EXAM  01/12/2024   OPHTHALMOLOGY EXAM  03/13/2024   Diabetic kidney evaluation - eGFR measurement  04/13/2024   Medicare Annual Wellness (AWV)  06/05/2024   DTaP/Tdap/Td (3 - Td or Tdap) 01/13/2032   Pneumonia Vaccine 57+ Years old  Completed   DEXA SCAN  Completed   Hepatitis C Screening  Completed   Zoster Vaccines- Shingrix  Completed   HPV VACCINES  Aged Out    Health Maintenance  Health Maintenance Due  Topic Date Due   MAMMOGRAM  09/07/2022   COVID-19 Vaccine (9 - 2023-24 season) 06/03/2023   INFLUENZA VACCINE  06/02/2023    Colorectal cancer screening: Type of screening: Colonoscopy. Completed 10/02/2014. Repeat every 10 years  Mammogram status: Completed 09/07/2021. Repeat every year An order has been placed on 08/25/2022.  Bone Density status: Ordered 06/06/2023. Pt provided with contact info and advised to call to schedule  appt.  Lung Cancer Screening: (Low Dose CT Chest recommended if Age 48-80 years, 20 pack-year currently smoking OR have quit w/in 15years.) does not qualify.   Lung Cancer Screening Referral: N/A  Additional Screening:  Hepatitis C Screening: does qualify; Completed 02/18/2023  Vision Screening: Recommended annual ophthalmology exams for early detection  of glaucoma and other disorders of the eye. Is the patient up to date with their annual eye exam?  Yes  Who is the provider or what is the name of the office in which the patient attends annual eye exams? Dr. Burgess Estelle If pt is not established with a provider, would they like to be referred to a provider to establish care? No .   Dental Screening: Recommended annual dental exams for proper oral hygiene  Diabetic Foot Exam: Diabetic Foot Exam: Completed 05/04/2023  Community Resource Referral / Chronic Care Management: CRR required this visit?  Yes   CCM required this visit?  No     Plan:     I have personally reviewed and noted the following in the patient's chart:   Medical and social history Use of alcohol, tobacco or illicit drugs  Current medications and supplements including opioid prescriptions. Patient is not currently taking opioid prescriptions. Functional ability and status Nutritional status Physical activity Advanced directives List of other physicians Hospitalizations, surgeries, and ER visits in previous 12 months Vitals Screenings to include cognitive, depression, and falls Referrals and appointments  In addition, I have reviewed and discussed with patient certain preventive protocols, quality metrics, and best practice recommendations. A written personalized care plan for preventive services as well as general preventive health recommendations were provided to patient.      L , CMA   06/06/2023   After Visit Summary: (MyChart) Due to this being a telephonic visit, the after visit summary with  patients personalized plan was offered to patient via MyChart   Nurse Notes: Patient is due for a mammogram and a DEXA.  A referral has been placed today for the DEXA and a order for a mammogram was placed last year on, 08/25/2022 by Dr. Jonny Ruiz.  Patient is aware to call and schedule her mammogram before 08/2023, to schedule.  Also patient has a need for a CRR referral for food insecurity.  She has no other concerns today.

## 2023-06-06 NOTE — Patient Instructions (Signed)
Angel French , Thank you for taking time to come for your Medicare Wellness Visit. I appreciate your ongoing commitment to your health goals. Please review the following plan we discussed and let me know if I can assist you in the future.   Referrals/Orders/Follow-Ups/Clinician Recommendations: Remember to call and schedule your mammogram before the end of October.  Also call to schedule a bone density screening at [facility] :[address], [place], [place] [address],  [phone]).  Each day, aim for 6 glasses of water, plenty of protein in your diet and try to get up and walk/ stretch every hour for 5-10 minutes at a time.     This is a list of the screening recommended for you and due dates:  Health Maintenance  Topic Date Due   Mammogram  09/07/2022   Flu Shot  06/02/2023   COVID-19 Vaccine (9 - 2023-24 season) 06/23/2023   Hemoglobin A1C  10/14/2023   Yearly kidney health urinalysis for diabetes  01/10/2024   Complete foot exam   01/12/2024   Eye exam for diabetics  03/13/2024   Yearly kidney function blood test for diabetes  04/13/2024   Medicare Annual Wellness Visit  06/05/2024   DTaP/Tdap/Td vaccine (3 - Td or Tdap) 01/13/2032   Pneumonia Vaccine  Completed   DEXA scan (bone density measurement)  Completed   Hepatitis C Screening  Completed   Zoster (Shingles) Vaccine  Completed   HPV Vaccine  Aged Out    Advanced directives: (Copy Requested) Please bring a copy of your health care power of attorney and living will to the office to be added to your chart at your convenience.  Next Medicare Annual Wellness Visit scheduled for next year: Yes  Preventive Care 65 Years and Older, Female Preventive care refers to lifestyle choices and visits with your health care provider that can promote health and wellness. What does preventive care include? A yearly physical exam. This is also called an annual well check. Dental exams once or twice a  year. Routine eye exams. Ask your health care provider how often you should have your eyes checked. Personal lifestyle choices, including: Daily care of your teeth and gums. Regular physical activity. Eating a healthy diet. Avoiding tobacco and drug use. Limiting alcohol use. Practicing safe sex. Taking low-dose aspirin every day. Taking vitamin and mineral supplements as recommended by your health care provider. What happens during an annual well check? The services and screenings done by your health care provider during your annual well check will depend on your age, overall health, lifestyle risk factors, and family history of disease. Counseling  Your health care provider may ask you questions about your: Alcohol use. Tobacco use. Drug use. Emotional well-being. Home and relationship well-being. Sexual activity. Eating habits. History of falls. Memory and ability to understand (cognition). Work and work Astronomer. Reproductive health. Screening  You may have the following tests or measurements: Height, weight, and BMI. Blood pressure. Lipid and cholesterol levels. These may be checked every 5 years, or more frequently if you are over 84 years old. Skin check. Lung cancer screening. You may have this screening every year starting at age 70 if you have a 30-pack-year history of smoking and currently smoke or have quit within the past 15 years. Fecal occult blood test (FOBT) of the stool. You may have this test every year starting at age 22. Flexible sigmoidoscopy or colonoscopy. You may have a sigmoidoscopy every 5  years or a colonoscopy every 10 years starting at age 15. Hepatitis C blood test. Hepatitis B blood test. Sexually transmitted disease (STD) testing. Diabetes screening. This is done by checking your blood sugar (glucose) after you have not eaten for a while (fasting). You may have this done every 1-3 years. Bone density scan. This is done to screen for  osteoporosis. You may have this done starting at age 81. Mammogram. This may be done every 1-2 years. Talk to your health care provider about how often you should have regular mammograms. Talk with your health care provider about your test results, treatment options, and if necessary, the need for more tests. Vaccines  Your health care provider may recommend certain vaccines, such as: Influenza vaccine. This is recommended every year. Tetanus, diphtheria, and acellular pertussis (Tdap, Td) vaccine. You may need a Td booster every 10 years. Zoster vaccine. You may need this after age 82. Pneumococcal 13-valent conjugate (PCV13) vaccine. One dose is recommended after age 88. Pneumococcal polysaccharide (PPSV23) vaccine. One dose is recommended after age 54. Talk to your health care provider about which screenings and vaccines you need and how often you need them. This information is not intended to replace advice given to you by your health care provider. Make sure you discuss any questions you have with your health care provider. Document Released: 11/14/2015 Document Revised: 07/07/2016 Document Reviewed: 08/19/2015 Elsevier Interactive Patient Education  2017 [employer].  Fall Prevention in the Home Falls can cause injuries. They can happen to people of all ages. There are many things you can do to make your home safe and to help prevent falls. What can I do on the outside of my home? Regularly fix the edges of walkways and driveways and fix any cracks. Remove anything that might make you trip as you walk through a door, such as a raised step or threshold. Trim any bushes or trees on the path to your home. Use bright outdoor lighting. Clear any walking paths of anything that might make someone trip, such as rocks or tools. Regularly check to see if handrails are loose or broken. Make sure that both sides of any steps have handrails. Any raised decks and porches should have guardrails on  the edges. Have any leaves, snow, or ice cleared regularly. Use sand or salt on walking paths during winter. Clean up any spills in your garage right away. This includes oil or grease spills. What can I do in the bathroom? Use night lights. Install grab bars by the toilet and in the tub and shower. Do not use towel bars as grab bars. Use non-skid mats or decals in the tub or shower. If you need to sit down in the shower, use a plastic, non-slip stool. Keep the floor dry. Clean up any water that spills on the floor as soon as it happens. Remove soap buildup in the tub or shower regularly. Attach bath mats securely with double-sided non-slip rug tape. Do not have throw rugs and other things on the floor that can make you trip. What can I do in the bedroom? Use night lights. Make sure that you have a light by your bed that is easy to reach. Do not use any sheets or blankets that are too big for your bed. They should not hang down onto the floor. Have a firm chair that has side arms. You can use this for support while you get dressed. Do not have throw rugs and other things on the  floor that can make you trip. What can I do in the kitchen? Clean up any spills right away. Avoid walking on wet floors. Keep items that you use a lot in easy-to-reach places. If you need to reach something above you, use a strong step stool that has a grab bar. Keep electrical cords out of the way. Do not use floor polish or wax that makes floors slippery. If you must use wax, use non-skid floor wax. Do not have throw rugs and other things on the floor that can make you trip. What can I do with my stairs? Do not leave any items on the stairs. Make sure that there are handrails on both sides of the stairs and use them. Fix handrails that are broken or loose. Make sure that handrails are as long as the stairways. Check any carpeting to make sure that it is firmly attached to the stairs. Fix any carpet that is loose  or worn. Avoid having throw rugs at the top or bottom of the stairs. If you do have throw rugs, attach them to the floor with carpet tape. Make sure that you have a light switch at the top of the stairs and the bottom of the stairs. If you do not have them, ask someone to add them for you. What else can I do to help prevent falls? Wear shoes that: Do not have high heels. Have rubber bottoms. Are comfortable and fit you well. Are closed at the toe. Do not wear sandals. If you use a stepladder: Make sure that it is fully opened. Do not climb a closed stepladder. Make sure that both sides of the stepladder are locked into place. Ask someone to hold it for you, if possible. Clearly mark and make sure that you can see: Any grab bars or handrails. First and last steps. Where the edge of each step is. Use tools that help you move around (mobility aids) if they are needed. These include: Canes. Walkers. Scooters. Crutches. Turn on the lights when you go into a dark area. Replace any light bulbs as soon as they burn out. Set up your furniture so you have a clear path. Avoid moving your furniture around. If any of your floors are uneven, fix them. If there are any pets around you, be aware of where they are. Review your medicines with your doctor. Some medicines can make you feel dizzy. This can increase your chance of falling. Ask your doctor what other things that you can do to help prevent falls. This information is not intended to replace advice given to you by your health care provider. Make sure you discuss any questions you have with your health care provider. Document Released: 08/14/2009 Document Revised: 03/25/2016 Document Reviewed: 11/22/2014 Elsevier Interactive Patient Education  2017 [employer].

## 2023-06-06 NOTE — Telephone Encounter (Signed)
Sorry, I dont know what a CRR is.  Please clarify   thanks

## 2023-06-15 ENCOUNTER — Other Ambulatory Visit: Payer: Self-pay | Admitting: Internal Medicine

## 2023-06-15 DIAGNOSIS — Z780 Asymptomatic menopausal state: Secondary | ICD-10-CM

## 2023-06-16 ENCOUNTER — Encounter (INDEPENDENT_AMBULATORY_CARE_PROVIDER_SITE_OTHER): Payer: Self-pay

## 2023-06-23 ENCOUNTER — Ambulatory Visit
Admission: RE | Admit: 2023-06-23 | Discharge: 2023-06-23 | Disposition: A | Discharge status: Home / Self Care | Payer: Medicare Other | Source: Ambulatory Visit | Attending: Internal Medicine | Admitting: Internal Medicine

## 2023-06-23 DIAGNOSIS — Z1231 Encounter for screening mammogram for malignant neoplasm of breast: Secondary | ICD-10-CM

## 2023-07-20 ENCOUNTER — Encounter: Payer: Self-pay | Admitting: Internal Medicine

## 2023-07-20 ENCOUNTER — Telehealth: Payer: Self-pay

## 2023-07-20 ENCOUNTER — Ambulatory Visit (INDEPENDENT_AMBULATORY_CARE_PROVIDER_SITE_OTHER): Payer: Medicare Other | Admitting: Internal Medicine

## 2023-07-20 VITALS — BP 118/62 | HR 70 | Temp 98.5°F | Ht 62.5 in | Wt 184.0 lb

## 2023-07-20 DIAGNOSIS — D649 Anemia, unspecified: Secondary | ICD-10-CM | POA: Diagnosis not present

## 2023-07-20 DIAGNOSIS — E559 Vitamin D deficiency, unspecified: Secondary | ICD-10-CM | POA: Diagnosis not present

## 2023-07-20 DIAGNOSIS — E7800 Pure hypercholesterolemia, unspecified: Secondary | ICD-10-CM

## 2023-07-20 DIAGNOSIS — I10 Essential (primary) hypertension: Secondary | ICD-10-CM

## 2023-07-20 DIAGNOSIS — Z794 Long term (current) use of insulin: Secondary | ICD-10-CM

## 2023-07-20 DIAGNOSIS — N1832 Chronic kidney disease, stage 3b: Secondary | ICD-10-CM | POA: Diagnosis not present

## 2023-07-20 DIAGNOSIS — E1142 Type 2 diabetes mellitus with diabetic polyneuropathy: Secondary | ICD-10-CM | POA: Diagnosis not present

## 2023-07-20 LAB — IBC PANEL
Iron: 59 ug/dL (ref 42–145)
Saturation Ratios: 20 % (ref 20.0–50.0)
TIBC: 295.4 ug/dL (ref 250.0–450.0)
Transferrin: 211 mg/dL — ABNORMAL LOW (ref 212.0–360.0)

## 2023-07-20 LAB — FERRITIN: Ferritin: 52.4 ng/mL (ref 10.0–291.0)

## 2023-07-20 LAB — CBC WITH DIFFERENTIAL/PLATELET
Basophils Absolute: 0 10*3/uL (ref 0.0–0.1)
Basophils Relative: 0.7 % (ref 0.0–3.0)
Eosinophils Absolute: 0.2 10*3/uL (ref 0.0–0.7)
Eosinophils Relative: 3.5 % (ref 0.0–5.0)
HCT: 28.2 % — ABNORMAL LOW (ref 36.0–46.0)
Hemoglobin: 9.1 g/dL — ABNORMAL LOW (ref 12.0–15.0)
Lymphocytes Relative: 31.5 % (ref 12.0–46.0)
Lymphs Abs: 2 10*3/uL (ref 0.7–4.0)
MCHC: 32.4 g/dL (ref 30.0–36.0)
MCV: 97 fl (ref 78.0–100.0)
Monocytes Absolute: 0.5 10*3/uL (ref 0.1–1.0)
Monocytes Relative: 8.4 % (ref 3.0–12.0)
Neutro Abs: 3.5 10*3/uL (ref 1.4–7.7)
Neutrophils Relative %: 55.9 % (ref 43.0–77.0)
Platelets: 217 10*3/uL (ref 150.0–400.0)
RBC: 2.91 Mil/uL — ABNORMAL LOW (ref 3.87–5.11)
RDW: 14.4 % (ref 11.5–15.5)
WBC: 6.2 10*3/uL (ref 4.0–10.5)

## 2023-07-20 LAB — VITAMIN D 25 HYDROXY (VIT D DEFICIENCY, FRACTURES): VITD: 68.71 ng/mL (ref 30.00–100.00)

## 2023-07-20 LAB — BASIC METABOLIC PANEL WITH GFR
BUN: 19 mg/dL (ref 6–23)
CO2: 30 meq/L (ref 19–32)
Calcium: 8.4 mg/dL (ref 8.4–10.5)
Chloride: 104 meq/L (ref 96–112)
Creatinine, Ser: 1.51 mg/dL — ABNORMAL HIGH (ref 0.40–1.20)
GFR: 34.55 mL/min — ABNORMAL LOW (ref >60.00)
Glucose, Bld: 48 mg/dL — CL (ref 70–99)
Potassium: 3.9 meq/L (ref 3.5–5.1)
Sodium: 139 meq/L (ref 135–145)

## 2023-07-20 LAB — HEPATIC FUNCTION PANEL
ALT: 11 U/L (ref 0–35)
AST: 18 U/L (ref 0–37)
Albumin: 3.7 g/dL (ref 3.5–5.2)
Alkaline Phosphatase: 65 U/L (ref 39–117)
Bilirubin, Direct: 0.1 mg/dL (ref 0.0–0.3)
Total Bilirubin: 0.5 mg/dL (ref 0.2–1.2)
Total Protein: 6.4 g/dL (ref 6.0–8.3)

## 2023-07-20 LAB — LIPID PANEL
Cholesterol: 118 mg/dL (ref 0–200)
HDL: 47.3 mg/dL (ref >39.00)
LDL Cholesterol: 43 mg/dL (ref 0–99)
NonHDL: 70.45
Total CHOL/HDL Ratio: 2
Triglycerides: 138 mg/dL (ref 0.0–149.0)
VLDL: 27.6 mg/dL (ref 0.0–40.0)

## 2023-07-20 LAB — HEMOGLOBIN A1C: Hgb A1c MFr Bld: 5 % (ref 4.6–6.5)

## 2023-07-20 MED ORDER — TIRZEPATIDE 2.5 MG/0.5ML ~~LOC~~ SOAJ: 2.5000 mg | SUBCUTANEOUS | 11 refills | Status: DC

## 2023-07-20 NOTE — Patient Instructions (Signed)
Ok to start the mounjaro 2.5 mg weekly  Please call in 1 month if doing ok, for increase to 5 mg weekly, and possible stop of the prandin  Please continue all other medications as before, and refills have been done if requested.  Please have the pharmacy call with any other refills you may need.  Please continue your efforts at being more active, low cholesterol diet, and weight control.  Please keep your appointments with your specialists as you may have planned - renal next week  Please go to the LAB at the blood drawing area for the tests to be done  You will be contacted by phone if any changes need to be made immediately.  Otherwise, you will receive a letter about your results with an explanation, but please check with MyChart first.  Please make an Appointment to return in 6 months, or sooner if needed, also with Lab Appointment for testing done 3-5 days before at the FIRST FLOOR Lab (so this is for TWO appointments - please see the scheduling desk as you leave)

## 2023-07-20 NOTE — Progress Notes (Signed)
Patient ID: Angel French, female   DOB: 1951/03/06, 72 y.o.   MRN: [id number]        Chief Complaint: follow up dm, anemai, peripheral edema, ckd3a       HPI:  Angel French is a 72 y.o. female here overall doing ok,  Pt denies chest pain, increased sob or doe, wheezing, orthopnea, PND,  palpitations, dizziness or syncope, but has had trace swelling let > right in the past 2 wks to the legs.   Pt denies polydipsia, polyuria, or new focal neuro s/s, though hard to lose wt, in fact keeps gaining.  Has f/u appt with renal next wk     Wt Readings from Last 3 Encounters:  07/20/23 184 lb (83.5 kg)  06/06/23 167 lb (75.8 kg)  04/14/23 172 lb (78 kg)   BP Readings from Last 3 Encounters:  07/20/23 118/62  04/14/23 112/78  01/12/23 136/72         Past Medical History:  Diagnosis Date   Allergy    generic allergy pill; Spring and Fall only.   Anxiety    Arthritis    DDD lumbar, R hip OA.  s/p ortho consult in past.   Blood transfusion without reported diagnosis    Mountain climbing accident in [place].   Brachial plexus disorders    Cataract    B retractions.   Chronic kidney disease    stage 3 per pt.    Chronic pain syndrome    Chronic renal insufficiency, stage 3 (moderate) (HCC)    Constipation    DDD (degenerative disc disease), lumbar    Depression    Diabetes mellitus    Diabetic peripheral neuropathy associated with type 2 diabetes mellitus (HCC)    Diabetic retinopathy (HCC)    Diabetic retinopathy associated with type 2 diabetes mellitus (HCC)    s/p laser treatment multiple.  Unable to drive.   Fatty liver    Fibromyalgia    Food allergy    GERD (gastroesophageal reflux disease)    Hypercholesteremia    Hyperlipidemia    Hypertension    controlled, off meds    IBS (irritable bowel syndrome)    Leg edema    Neuromuscular disorder (HCC)    OSA (obstructive sleep apnea)    Osteoarthritis    Rheumatic fever    Rheumatoid arthritis (HCC)    Stomach ulcer     Swallowing difficulty    TIA (transient ischemic attack)    Ulcer    Peptic ulcer H. Pylori + s/p treatment.  Upper GI diagnosed.[name] Prilosec PRN .   Past Surgical History:  Procedure Laterality Date    2 SPINAL INJECTIONS      ABDOMINAL HYSTERECTOMY  11/02/1979   DUB; cervical dysplasia; ovaries intact.   ABDOMINAL SURGERY     staph abcess    [facility] Admission     age 56; three months in [place].   BIOPSY  07/26/2020   Procedure: BIOPSY;  Surgeon: Willis Modena, MD;  Location: [facility];  Service: Endoscopy;;   BREAST BIOPSY     CARDIAC CATHETERIZATION  11/02/2007   normal coronary arteries.   CARPAL TUNNEL RELEASE     Bilateral.   CATARACT EXTRACTION, BILATERAL     CHOLECYSTECTOMY     ESOPHAGEAL MANOMETRY N/A 09/14/2017   Procedure: ESOPHAGEAL MANOMETRY (EM);  Surgeon: Kerin Salen, MD;  Location: [facility];  Service: Gastroenterology;  Laterality: N/A;   ESOPHAGOGASTRODUODENOSCOPY (EGD) WITH PROPOFOL N/A 07/26/2020  Procedure: ESOPHAGOGASTRODUODENOSCOPY (EGD) WITH PROPOFOL;  Surgeon: Willis Modena, MD;  Location: [facility];  Service: Endoscopy;  Laterality: N/A;   EYE SURGERY     Cataracts B. Laser surgery x 7 for Diabetic Retinopathy   TONSILLECTOMY      reports that she has quit smoking. Her smoking use included cigarettes. She has never used smokeless tobacco. She reports that she does not drink alcohol and does not use drugs. family history includes Breast cancer in her daughter. She was adopted. Allergies  Allergen Reactions   Codeine Anaphylaxis   Iodinated Contrast Media Anaphylaxis    Other reaction(s): respiratory distress   Nitrofurantoin Monohyd Macro Anaphylaxis   Betadine [Povidone Iodine] Itching   Folic Acid Itching   Gabapentin Other (See Comments)    Makes patient feel drunk   Iodine Hives   Lyrica [Pregabalin] Other (See Comments)    Makes patient feel drunk   Red Dye #40 (Allura Red) Itching    Other reaction(s): Unknown    Ultram [Tramadol Hcl] Nausea And Vomiting   Current Outpatient Medications on File Prior to Visit  Medication Sig Dispense Refill   acetaminophen (TYLENOL) 325 MG tablet Take 650 mg by mouth every 6 (six) hours as needed (every 6 weeks before RA infusion).     allopurinol (ZYLOPRIM) 100 MG tablet Take 1 tablet (100 mg total) by mouth daily. Ov needed 90 tablet 3   amLODipine (NORVASC) 2.5 MG tablet Take 2.5 mg by mouth daily in the afternoon.     cholecalciferol (VITAMIN D3) 25 MCG (1000 UNIT) tablet Take 1,000 Units by mouth daily.     diclofenac Sodium (VOLTAREN) 1 % GEL APPLY AS DIRECTED FOUR TIMES DAILY AS NEEDED     diphenhydrAMINE (BENADRYL) 25 MG tablet Take 25 mg by mouth every 6 (six) hours as needed (every 6 weeks prior to RA infusion).     escitalopram (LEXAPRO) 20 MG tablet Take by mouth.     HYDROcodone-acetaminophen (NORCO) 10-325 MG tablet Take 1 tablet by mouth every 6 (six) hours as needed.     leflunomide (ARAVA) 20 MG tablet 1 tablet Orally Once a day for 30 day(s)     Multiple Vitamin (MULTIVITAMIN WITH MINERALS) TABS tablet Take 1 tablet by mouth daily.     mupirocin ointment (BACTROBAN) 2 % APPLY TO AFFECTED AREA 3 TIMES A DAY     nystatin cream (MYCOSTATIN) Apply topically 2 (two) times daily.     omeprazole (PRILOSEC) 20 MG capsule TAKE 1 CAPSULE BY MOUTH EVERY DAY (Patient taking differently: Take 20 mg by mouth daily.) 90 capsule 3   repaglinide (PRANDIN) 1 MG tablet Take 1 tablet (1 mg total) by mouth 3 (three) times daily before meals. 270 tablet 1   rosuvastatin (CRESTOR) 20 MG tablet TAKE 1 TABLET BY MOUTH EVERY DAY 90 tablet 3   traZODone (DESYREL) 100 MG tablet TAKE 2 TABLETS BY MOUTH EVERY DAY AT BEDTIME (Patient taking differently: Take 200 mg by mouth at bedtime.) 180 tablet 1   bacitracin 500 UNIT/GM ointment SMARTSIG:Sparingly Topical As Directed (Patient not taking: Reported on 06/06/2023)     clotrimazole-betamethasone (LOTRISONE) cream Apply 1 Application  topically 2 (two) times daily. (Patient not taking: Reported on 06/06/2023) 30 g 0   Continuous Blood Gluc Receiver (FREESTYLE LIBRE 3 READER) DEVI 1 Device by Does not apply route daily. (Patient not taking: Reported on 06/06/2023) 1 each 0   Continuous Blood Gluc Sensor (FREESTYLE LIBRE 3 SENSOR) MISC Place 1 sensor on the skin  every 14 days. Use to check glucose continuously E11.9 (Patient not taking: Reported on 06/06/2023) 2 each 11   inFLIXimab (REMICADE) 100 MG injection 10 mg/kg Intravenous 8 weeks (Patient not taking: Reported on 06/06/2023)     linaclotide (LINZESS) 145 MCG CAPS capsule Take 1 capsule (145 mcg total) by mouth daily before breakfast. (Patient not taking: Reported on 06/06/2023) 30 capsule 11   neomycin-polymyxin-hydrocortisone (CORTISPORIN) OTIC solution Place 4 drops into the left ear 4 (four) times daily. (Patient not taking: Reported on 06/06/2023) 10 mL 0   oxybutynin (DITROPAN XL) 15 MG 24 hr tablet TAKE 1 TABLET BY MOUTH AT BEDTIME (Patient not taking: Reported on 06/06/2023) 90 tablet 3   No current facility-administered medications on file prior to visit.        ROS:  All others reviewed and negative.  Objective        PE:  BP 118/62 (BP Location: Right Arm, Patient Position: Sitting, Cuff Size: Normal)   Pulse 70   Temp 98.5 F (36.9 C) (Oral)   Ht 5' 2.5" (1.588 m)   Wt 184 lb (83.5 kg)   SpO2 99%   BMI 33.12 kg/m                 Constitutional: Pt appears in NAD               HENT: Head: NCAT.                Right Ear: External ear normal.                 Left Ear: External ear normal.                Eyes: . Pupils are equal, round, and reactive to light. Conjunctivae and EOM are normal               Nose: without d/c or deformity               Neck: Neck supple. Gross normal ROM               Cardiovascular: Normal rate and regular rhythm.                 Pulmonary/Chest: Effort normal and breath sounds without rales or wheezing.                Abd:  Soft, NT,  ND, + BS, no organomegaly               Neurological: Pt is alert. At baseline orientation, motor grossly intact               Skin: Skin is warm. No rashes, no other new lesions, LE edema - trace left > right               Psychiatric: Pt behavior is normal without agitation   Micro: none  Cardiac tracings I have personally interpreted today:  none  Pertinent Radiological findings (summarize): none   Lab Results  Component Value Date   WBC 6.2 07/20/2023   HGB 9.1 (L) 07/20/2023   HCT 28.2 (L) 07/20/2023   PLT 217.0 07/20/2023   GLUCOSE 48 (LL) 07/20/2023   CHOL 118 07/20/2023   TRIG 138.0 07/20/2023   HDL 47.30 07/20/2023   LDLDIRECT 62.0 07/02/2022   LDLCALC 43 07/20/2023   ALT 11 07/20/2023   AST 18 07/20/2023   NA 139 07/20/2023   K 3.9 07/20/2023   CL  104 07/20/2023   CREATININE 1.51 (H) 07/20/2023   BUN 19 07/20/2023   CO2 30 07/20/2023   TSH 1.21 01/10/2023   INR 0.9 06/23/2020   HGBA1C 5.0 07/20/2023   MICROALBUR 2.9 (H) 01/10/2023   Assessment/Plan:  Angel French is a 72 y.o. White or Caucasian [1] female with  has a past medical history of Allergy, Anxiety, Arthritis, Blood transfusion without reported diagnosis, Brachial plexus disorders, Cataract, Chronic kidney disease, Chronic pain syndrome, Chronic renal insufficiency, stage 3 (moderate) (HCC), Constipation, DDD (degenerative disc disease), lumbar, Depression, Diabetes mellitus, Diabetic peripheral neuropathy associated with type 2 diabetes mellitus (HCC), Diabetic retinopathy (HCC), Diabetic retinopathy associated with type 2 diabetes mellitus (HCC), Fatty liver, Fibromyalgia, Food allergy, GERD (gastroesophageal reflux disease), Hypercholesteremia, Hyperlipidemia, Hypertension, IBS (irritable bowel syndrome), Leg edema, Neuromuscular disorder (HCC), OSA (obstructive sleep apnea), Osteoarthritis, Rheumatic fever, Rheumatoid arthritis (HCC), Stomach ulcer, Swallowing difficulty, TIA (transient ischemic attack),  and Ulcer.  CKD (chronic kidney disease) stage 3, GFR 30-59 ml/min (HCC) Lab Results  Component Value Date   CREATININE 1.51 (H) 07/20/2023   Stable overall, cont to avoid nephrotoxins, f/u renal as planned next wk  Essential hypertension, benign BP Readings from Last 3 Encounters:  07/20/23 118/62  04/14/23 112/78  01/12/23 136/72   Stable, pt to continue medical treatment norvasc 2.5 qd   Pure hypercholesterolemia Lab Results  Component Value Date   LDLCALC 43 07/20/2023   Stable, pt to continue current statin crestor 20 qd   Type 2 diabetes mellitus with diabetic polyneuropathy, with long-term current use of insulin (HCC) Lab Results  Component Value Date   HGBA1C 5.0 07/20/2023   Stable with obesity, pt to continue current medical treatment prandin 1 mg tid, add mounjaro with eye to d/c prandin if tolerates well   Vitamin D deficiency Last vitamin D Lab Results  Component Value Date   VD25OH 68.71 07/20/2023   Stable, cont oral replacement   Anemia Recent worsening, for iron labs, but suspect related to ckd - has renal f/u next wk  Followup: Return in about 6 months (around 01/17/2024).  Oliver Barre, MD 07/20/2023 8:41 PM [facility]

## 2023-07-20 NOTE — Assessment & Plan Note (Signed)
BP Readings from Last 3 Encounters:  07/20/23 118/62  04/14/23 112/78  01/12/23 136/72   Stable, pt to continue medical treatment norvasc 2.5 qd

## 2023-07-20 NOTE — Assessment & Plan Note (Signed)
Recent worsening, for iron labs, but suspect related to ckd - has renal f/u next wk

## 2023-07-20 NOTE — Telephone Encounter (Signed)
Ok this is noted

## 2023-07-20 NOTE — Assessment & Plan Note (Signed)
Lab Results  Component Value Date   CREATININE 1.51 (H) 07/20/2023   Stable overall, cont to avoid nephrotoxins, f/u renal as planned next wk

## 2023-07-20 NOTE — Assessment & Plan Note (Signed)
Last vitamin D Lab Results  Component Value Date   VD25OH 68.71 07/20/2023   Stable, cont oral replacement

## 2023-07-20 NOTE — Assessment & Plan Note (Signed)
Lab Results  Component Value Date   HGBA1C 5.0 07/20/2023   Stable with obesity, pt to continue current medical treatment prandin 1 mg tid, add mounjaro with eye to d/c prandin if tolerates well

## 2023-07-20 NOTE — Assessment & Plan Note (Signed)
Lab Results  Component Value Date   LDLCALC 43 07/20/2023   Stable, pt to continue current statin crestor 20 qd

## 2023-07-20 NOTE — Telephone Encounter (Signed)
CRITICAL VALUE STICKER  CRITICAL VALUE: Glucose of 48  RECEIVER (on-site recipient of call): Delorise Shiner   DATE & TIME NOTIFIED: 07/20/23 @ 4:09 pm  MESSENGER (representative from lab): Saa  MD NOTIFIED: Yes  TIME OF NOTIFICATION: 4:10 pm

## 2023-07-21 ENCOUNTER — Other Ambulatory Visit: Payer: Self-pay | Admitting: Internal Medicine

## 2023-07-21 DIAGNOSIS — E213 Hyperparathyroidism, unspecified: Secondary | ICD-10-CM

## 2023-07-21 LAB — URINALYSIS, ROUTINE W REFLEX MICROSCOPIC
Bilirubin Urine: NEGATIVE
Hgb urine dipstick: NEGATIVE
Leukocytes,Ua: NEGATIVE
Nitrite: NEGATIVE
RBC / HPF: NONE SEEN (ref 0–?)
Specific Gravity, Urine: >=1.03 — AB (ref 1.000–1.030)
Total Protein, Urine: NEGATIVE
Urine Glucose: NEGATIVE
Urobilinogen, UA: 1 (ref 0.0–1.0)
pH: 6 (ref 5.0–8.0)

## 2023-07-21 LAB — PTH, INTACT AND CALCIUM
Calcium: 8.9 mg/dL (ref 8.6–10.4)
PTH: 129 pg/mL — ABNORMAL HIGH (ref 16–77)

## 2023-09-05 ENCOUNTER — Ambulatory Visit (INDEPENDENT_AMBULATORY_CARE_PROVIDER_SITE_OTHER): Payer: Medicare Other | Admitting: Podiatry

## 2023-09-05 ENCOUNTER — Encounter: Payer: Self-pay | Admitting: Podiatry

## 2023-09-05 DIAGNOSIS — E1142 Type 2 diabetes mellitus with diabetic polyneuropathy: Secondary | ICD-10-CM | POA: Diagnosis not present

## 2023-09-05 DIAGNOSIS — M79674 Pain in right toe(s): Secondary | ICD-10-CM | POA: Diagnosis not present

## 2023-09-05 DIAGNOSIS — B351 Tinea unguium: Secondary | ICD-10-CM | POA: Diagnosis not present

## 2023-09-05 DIAGNOSIS — M79675 Pain in left toe(s): Secondary | ICD-10-CM

## 2023-09-05 DIAGNOSIS — Z794 Long term (current) use of insulin: Secondary | ICD-10-CM

## 2023-09-05 NOTE — Progress Notes (Signed)
This patient returns to my office for at risk foot care.  This patient requires this care by a professional since this patient will be at risk due to having diabetes.  This patient is unable to cut nails herself since the patient cannot reach her nails.These nails are painful walking and wearing shoes.  This patient presents for at risk foot care today.  General Appearance  Alert, conversant and in no acute stress.  Vascular  Dorsalis pedis and posterior tibial  pulses are palpable  bilaterally.  Capillary return is within normal limits  bilaterally. Temperature is within normal limits  bilaterally.  Neurologic  Senn-Weinstein monofilament wire test within normal limits  bilaterally. Muscle power within normal limits bilaterally.  Nails Thick disfigured discolored nails with subungual debris  from hallux to fifth toes bilaterally. No evidence of bacterial infection or drainage bilaterally.  Orthopedic  No limitations of motion  feet .  No crepitus or effusions noted.  No bony pathology or digital deformities noted.  Skin  normotropic skin with no porokeratosis noted bilaterally.  No signs of infections or ulcers noted.     Onychomycosis  Pain in right toes  Pain in left toes  Consent was obtained for treatment procedures.   Mechanical debridement of nails 1-5  bilaterally performed with a nail nipper.  Filed with dremel without incident.    Return office visit   4 months                   Told patient to return for periodic foot care and evaluation due to potential at risk complications.   Karmah Potocki DPM  

## 2023-10-18 ENCOUNTER — Other Ambulatory Visit: Payer: Self-pay | Admitting: Internal Medicine

## 2023-11-22 ENCOUNTER — Ambulatory Visit
Admission: RE | Admit: 2023-11-22 | Discharge: 2023-11-22 | Disposition: A | Discharge status: Home / Self Care | Payer: Medicare Other | Source: Ambulatory Visit | Attending: Internal Medicine | Admitting: Internal Medicine

## 2023-11-22 DIAGNOSIS — Z780 Asymptomatic menopausal state: Secondary | ICD-10-CM

## 2023-11-23 ENCOUNTER — Other Ambulatory Visit: Payer: Self-pay | Admitting: Internal Medicine

## 2023-11-23 ENCOUNTER — Encounter: Payer: Self-pay | Admitting: Internal Medicine

## 2023-11-23 MED ORDER — ALENDRONATE SODIUM 70 MG PO TABS: 70.0000 mg | ORAL_TABLET | ORAL | 3 refills | Status: AC

## 2023-11-29 ENCOUNTER — Telehealth: Payer: Self-pay

## 2023-11-29 DIAGNOSIS — E210 Primary hyperparathyroidism: Secondary | ICD-10-CM

## 2023-11-29 NOTE — Telephone Encounter (Signed)
Orders Placed This Encounter  Procedures   Vitamin D 1,25 dihydroxy   Renal function panel   PTH, intact and calcium   Magnesium   VITAMIN D 25 Hydroxy (Vit-D Deficiency, Fractures)   Orders placed from Provider List.   Vitamin D may not be covered by insurance with the dx she has. Cost: $98.00

## 2023-11-30 ENCOUNTER — Other Ambulatory Visit: Payer: Medicare Other

## 2023-12-04 LAB — MAGNESIUM: Magnesium: 1.9 mg/dL (ref 1.5–2.5)

## 2023-12-04 LAB — RENAL FUNCTION PANEL
Albumin: 4 g/dL (ref 3.6–5.1)
BUN/Creatinine Ratio: 22 (calc) (ref 6–22)
BUN: 30 mg/dL — ABNORMAL HIGH (ref 7–25)
CO2: 30 mmol/L (ref 20–32)
Calcium: 9.6 mg/dL (ref 8.6–10.4)
Chloride: 106 mmol/L (ref 98–110)
Creat: 1.35 mg/dL — ABNORMAL HIGH (ref 0.60–1.00)
Glucose, Bld: 98 mg/dL (ref 65–99)
Phosphorus: 4.5 mg/dL — ABNORMAL HIGH (ref 2.1–4.3)
Potassium: 4.8 mmol/L (ref 3.5–5.3)
Sodium: 142 mmol/L (ref 135–146)

## 2023-12-04 LAB — PTH, INTACT AND CALCIUM
Calcium: 9.6 mg/dL (ref 8.6–10.4)
PTH: 85 pg/mL — ABNORMAL HIGH (ref 16–77)

## 2023-12-04 LAB — VITAMIN D 25 HYDROXY (VIT D DEFICIENCY, FRACTURES): Vit D, 25-Hydroxy: 55 ng/mL (ref 30–100)

## 2023-12-04 LAB — VITAMIN D 1,25 DIHYDROXY
Vitamin D 1, 25 (OH)2 Total: 18 pg/mL (ref 18–72)
Vitamin D2 1, 25 (OH)2: <8 pg/mL
Vitamin D3 1, 25 (OH)2: 18 pg/mL

## 2023-12-07 ENCOUNTER — Encounter: Payer: Self-pay | Admitting: "Endocrinology

## 2023-12-07 ENCOUNTER — Ambulatory Visit (INDEPENDENT_AMBULATORY_CARE_PROVIDER_SITE_OTHER): Payer: Medicare Other | Admitting: "Endocrinology

## 2023-12-07 VITALS — BP 112/60 | HR 71 | Resp 20 | Ht 62.5 in | Wt 191.2 lb

## 2023-12-07 DIAGNOSIS — E1142 Type 2 diabetes mellitus with diabetic polyneuropathy: Secondary | ICD-10-CM | POA: Diagnosis not present

## 2023-12-07 DIAGNOSIS — Z794 Long term (current) use of insulin: Secondary | ICD-10-CM

## 2023-12-07 DIAGNOSIS — E213 Hyperparathyroidism, unspecified: Secondary | ICD-10-CM

## 2023-12-07 LAB — POCT GLYCOSYLATED HEMOGLOBIN (HGB A1C): Hemoglobin A1C: 5.4 % (ref 4.0–5.6)

## 2023-12-07 NOTE — Progress Notes (Addendum)
 Outpatient Endocrinology Note Angel Birmingham, MD    Angel French 1951-07-09 [id number]  Referring Provider: Norleen French ORN, MD Primary Care Provider: Norleen French ORN, MD Reason for consultation: Subjective   Assessment & Plan  Diagnoses and all orders for this visit:  Hyperparathyroidism (HCC) -     PTH, intact and calcium  -     Renal function panel -     VITAMIN D  25 Hydroxy (Vit-D Deficiency, Fractures)  Type 2 diabetes mellitus with diabetic polyneuropathy, without long-term current use of insulin  (HCC) -     POCT glycosylated hemoglobin (Hb A1C)   Patient had a high PTH of 129 in 07/2023, which is now improved to 85.  25 vitamin D  was toxic high at 1 point but now has been normal to 55.  125 vitamin D  is on low normal side at 18, with history of CKD with creatinine of 1.35 and phosphorus elevated at 4.5.  Calcium  is within normal limits. Patient is not taking any kind of calcium /vitamin D /multivitamin.  Also has history of osteopenia with T-score of -2.4 at femoral left neck in 11/2023 DEXA scan. High phosphorus could be due to CKD which could be due to diabetes or other reasons On Fosamax  70 mg weekly Recommend to continue trending PTH with repeat labs in 3 months.  If continued PTH elevation, will give a trial of calcitriol   Patient has a history of diabetes, current A1c at 5.4% Patient was on insulin  at one point but currently take repaglinide  1 mg 3 times a day Seems like Mounjaro /libre were ordered but may not be covered by insurance Will follow-up on next visit  Return in about 2 weeks (around 12/21/2023) for visit for DM.   I have reviewed current medications, nurse's notes, allergies, vital signs, past medical and surgical history, family medical history, and social history for this encounter. Counseled patient on symptoms, examination findings, lab findings, imaging results, treatment decisions and monitoring and prognosis. The patient understood the  recommendations and agrees with the treatment plan. All questions regarding treatment plan were fully answered.  Angel Birmingham, MD  02/09/24   History of Present Illness HPI   Angel French is a 73 y.o. female referred by Dr. Norleen for evaluation and management of hyperparathyroidism.  Patient also interested in discussing her diabetes care.  Patient had a high PTH of 129 in 07/2023, which is now improved to 85.  25 vitamin D  was toxic high at 1 point but now has been normal to 55.  125 vitamin D  is on low normal side at 18, with history of CKD with creatinine of 1.35 and phosphorus elevated at 4.5.  Patient is not taking any kind of calcium /vitamin D /multivitamin.  Also has history of osteopenia with T-score of -2.4 at femoral left neck in 11/2023 DEXA scan.  Patient denies a history of kidney stones  She  current hematuria No polyuria Yes, has urinary incontinence  nocturia Yes thirst No renal failure Yes, has CKD  anorexia No abdominal pain No heartburn Yes, has prilosec  constipation Yes, has IBS nausea or vomiting No history of peptic ulcer disease Yes depression Yes, on lexapro   confusion No excessive fatigue No fracture Yes osteoporosis No, hjas osteopenia  headaches No numbness No tingling No  She takes Calcium  No She takes Vitamin D  supplements No  She a history of taking chronic lithium No She a recent history of thiazide diuretic intake No  She family history of renal stones/hypercalcemia No a  personal history of MEN syndromes/medullary thyroid  cancer/ pheochromocytoma No  Physical Exam  BP 112/60 (BP Location: Right Arm, Patient Position: Sitting, Cuff Size: Large)   Pulse 71   Resp 20   Ht 5' 2.5 (1.588 m)   Wt 191 lb 3.2 oz (86.7 kg)   SpO2 99%   BMI 34.41 kg/m    Constitutional: well developed, well nourished Head: normocephalic, atraumatic Eyes: sclera anicteric, no redness Neck: supple Lungs: normal respiratory effort Neurology: alert and  oriented Skin: dry, no appreciable rashes Musculoskeletal: no appreciable defects Psychiatric: normal mood and affect   Current Medications Patient's Medications  New Prescriptions   No medications on file  Previous Medications   ACETAMINOPHEN  (TYLENOL ) 325 MG TABLET    Take 650 mg by mouth every 6 (six) hours as needed (every 6 weeks before RA infusion).   ALENDRONATE  (FOSAMAX ) 70 MG TABLET    Take 1 tablet (70 mg total) by mouth every 7 (seven) days. Take with a full glass of water on an empty stomach.   ALLOPURINOL  (ZYLOPRIM ) 100 MG TABLET    Take 1 tablet (100 mg total) by mouth daily. Ov needed   AMLODIPINE  (NORVASC ) 2.5 MG TABLET    Take 2.5 mg by mouth daily in the afternoon.   BACITRACIN 500 UNIT/GM OINTMENT       CHOLECALCIFEROL (VITAMIN D3) 25 MCG (1000 UNIT) TABLET    Take 1,000 Units by mouth daily.   CLOTRIMAZOLE -BETAMETHASONE  (LOTRISONE ) CREAM    Apply 1 Application topically 2 (two) times daily.   CONTINUOUS BLOOD GLUC RECEIVER (FREESTYLE LIBRE 3 READER) DEVI    1 Device by Does not apply route daily.   CONTINUOUS BLOOD GLUC SENSOR (FREESTYLE LIBRE 3 SENSOR) MISC    Place 1 sensor on the skin every 14 days. Use to check glucose continuously E11.9   DICLOFENAC  SODIUM (VOLTAREN ) 1 % GEL    APPLY AS DIRECTED FOUR TIMES DAILY AS NEEDED   DIPHENHYDRAMINE (BENADRYL) 25 MG TABLET    Take 25 mg by mouth every 6 (six) hours as needed (every 6 weeks prior to RA infusion).   ESCITALOPRAM  (LEXAPRO ) 20 MG TABLET    Take by mouth.   HYDROCODONE -ACETAMINOPHEN  (NORCO) 10-325 MG TABLET    Take 1 tablet by mouth every 6 (six) hours as needed.   INFLIXIMAB  (REMICADE ) 100 MG INJECTION       LEFLUNOMIDE  (ARAVA ) 20 MG TABLET    1 tablet Orally Once a day for 30 day(s)   LINACLOTIDE  (LINZESS ) 145 MCG CAPS CAPSULE    Take 1 capsule (145 mcg total) by mouth daily before breakfast.   MULTIPLE VITAMIN (MULTIVITAMIN WITH MINERALS) TABS TABLET    Take 1 tablet by mouth daily.   MUPIROCIN OINTMENT  (BACTROBAN) 2 %    APPLY TO AFFECTED AREA 3 TIMES A DAY   NEOMYCIN -POLYMYXIN-HYDROCORTISONE (CORTISPORIN) OTIC SOLUTION    Place 4 drops into the left ear 4 (four) times daily.   NYSTATIN  CREAM (MYCOSTATIN )    Apply topically 2 (two) times daily.   OMEPRAZOLE  (PRILOSEC) 20 MG CAPSULE    TAKE ONE (1) CAPSULE BY MOUTH TWICE DAILY BEFORE MEALS   OXYBUTYNIN  (DITROPAN  XL) 15 MG 24 HR TABLET    TAKE 1 TABLET BY MOUTH AT BEDTIME   REPAGLINIDE  (PRANDIN ) 1 MG TABLET    Take 1 tablet (1 mg total) by mouth 3 (three) times daily before meals.   ROSUVASTATIN  (CRESTOR ) 20 MG TABLET    TAKE 1 TABLET BY MOUTH EVERY DAY  TIRZEPATIDE  (MOUNJARO ) 2.5 MG/0.5ML PEN    Inject 2.5 mg into the skin once a week.   TRAZODONE  (DESYREL ) 100 MG TABLET    TAKE 2 TABLETS BY MOUTH EVERY DAY AT BEDTIME  Modified Medications   No medications on file  Discontinued Medications   No medications on file    Allergies Allergies  Allergen Reactions   Codeine Anaphylaxis   Iodinated Contrast Media Anaphylaxis    Other reaction(s): respiratory distress   Nitrofurantoin Monohyd Macro Anaphylaxis   Betadine [Povidone Iodine] Itching   Folic Acid Itching   Gabapentin  Other (See Comments)    Makes patient feel drunk   Iodine Hives   Lyrica  [Pregabalin ] Other (See Comments)    Makes patient feel drunk   Red Dye #40 (Allura Red) Itching    Other reaction(s): Unknown   Ultram [Tramadol Hcl] Nausea And Vomiting    Past Medical History Past Medical History:  Diagnosis Date   Allergy    generic allergy pill; Spring and Fall only.   Anxiety    Arthritis    DDD lumbar, R hip OA.  s/p ortho consult in past.   Blood transfusion without reported diagnosis    M[place] climbing accident in [place].   Brachial plexus disorders    Cataract    B retractions.   Chronic kidney disease    stage 3 per pt.    Chronic pain syndrome    Chronic renal insufficiency, stage 3 (moderate) (HCC)    Constipation    DDD (degenerative disc  disease), lumbar    Depression    Diabetes mellitus    Diabetic peripheral neuropathy associated with type 2 diabetes mellitus (HCC)    Diabetic retinopathy (HCC)    Diabetic retinopathy associated with type 2 diabetes mellitus (HCC)    s/p laser treatment multiple.  Unable to drive.   Fatty liver    Fibromyalgia    Food allergy    GERD (gastroesophageal reflux disease)    Hypercholesteremia    Hyperlipidemia    Hypertension    controlled, off meds    IBS (irritable bowel syndrome)    Leg edema    Neuromuscular disorder (HCC)    OSA (obstructive sleep apnea)    Osteoarthritis    Rheumatic fever    Rheumatoid arthritis (HCC)    Stomach ulcer    Swallowing difficulty    TIA (transient ischemic attack)    Ulcer    Peptic ulcer H. Pylori + s/p treatment.  Upper GI diagnosed.SABRA Mask Prilosec PRN .    Past Surgical History Past Surgical History:  Procedure Laterality Date    2 SPINAL INJECTIONS      ABDOMINAL HYSTERECTOMY  11/02/1979   DUB; cervical dysplasia; ovaries intact.   ABDOMINAL SURGERY     staph abcess    [facility] Admission     age 42; three months in [place].   BIOPSY  07/26/2020   Procedure: BIOPSY;  Surgeon: Burnette Fallow, MD;  Location: [facility];  Service: Endoscopy;;   BREAST BIOPSY     CARDIAC CATHETERIZATION  11/02/2007   normal coronary arteries.   CARPAL TUNNEL RELEASE     Bilateral.   CATARACT EXTRACTION, BILATERAL     CHOLECYSTECTOMY     ESOPHAGEAL MANOMETRY N/A 09/14/2017   Procedure: ESOPHAGEAL MANOMETRY (EM);  Surgeon: Saintclair Jasper, MD;  Location: [facility];  Service: Gastroenterology;  Laterality: N/A;   ESOPHAGOGASTRODUODENOSCOPY (EGD) WITH PROPOFOL  N/A 07/26/2020   Procedure: ESOPHAGOGASTRODUODENOSCOPY (EGD) WITH PROPOFOL ;  Surgeon:  Burnette Fallow, MD;  Location: [facility];  Service: Endoscopy;  Laterality: N/A;   EYE SURGERY     Cataracts B. Laser surgery x 7 for Diabetic Retinopathy   TONSILLECTOMY      Family  History family history includes Breast cancer in her daughter. She was adopted.  Social History Social History   Socioeconomic History   Marital status: Married    Spouse name: Quarry Manager   Number of children: 2   Years of education: college   Highest education level: Not on file  Occupational History   Occupation: retired    Comment: retretied  Tobacco Use   Smoking status: Former    Types: Cigarettes   Smokeless tobacco: Never   Tobacco comments:    Quit 1987  Vaping Use   Vaping status: Never Used  Substance and Sexual Activity   Alcohol use: No    Alcohol/week: 0.0 standard drinks of alcohol   Drug use: No   Sexual activity: Yes    Birth control/protection: Surgical, Post-menopausal    Comment: widow  Other Topics Concern   Not on file  Social History Narrative   Marital status: widowed since 2009; dating x 6 years.  Happy; no abuse.      Children: 2 children (35 daughter, 2 son estranged); 2 grandchildren.      Lives: with boyfriend, daughter, granddaughter, friend of daughter.  Lives in pt house.      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  Diabetic retinopathy; unable to drive.      Tobacco:  Smoked x 20 years; quit 20 years.      Alcohol:  On special occasions; once per week on average.       Drugs:  None since college.      Exercise:  Walking several times per week; walks the dog.   Education college   Caffeine one cup daily.   Right handed      Advanced Directives: none; FULL CODE.  DNR/DNI.  HCPOA: [place]?           Social Drivers of Health   Financial Resource Strain: High Risk (06/06/2023)   Overall Financial Resource Strain (CARDIA)    Difficulty of Paying Living Expenses: Hard  Food Insecurity: Food Insecurity Present (06/06/2023)   Hunger Vital Sign    Worried About Running Out of Food in the Last Year: Often true    Ran Out of Food in the Last Year: Often true  Transportation Needs: No Transportation Needs (06/06/2023)    PRAPARE - Administrator, Civil Service (Medical): No    Lack of Transportation (Non-Medical): No  Physical Activity: Unknown (06/06/2023)   Exercise Vital Sign    Days of Exercise per Week: 7 days    Minutes of Exercise per Session: Not on file  Stress: No Stress Concern Present (06/06/2023)   [employer] of Occupational Health - Occupational Stress Questionnaire    Feeling of Stress : Only a little  Social Connections: Socially Integrated (06/06/2023)   Social Connection and Isolation Panel [NHANES]    Frequency of Communication with Friends and Family: More than three times a week    Frequency of Social Gatherings with Friends and Family: Once a week    Attends Religious Services: More than 4 times per year    Active Member of [employer] or Organizations: Yes    Attends Banker Meetings: 1 to 4 times per year    Marital Status: Married  Intimate Partner Violence: Not At Risk (06/06/2023)   Humiliation, Afraid, Rape, and Kick questionnaire    Fear of Current or Ex-Partner: No    Emotionally Abused: No    Physically Abused: No    Sexually Abused: No    Lab Results  Component Value Date   CHOL 137 01/17/2024   Lab Results  Component Value Date   HDL 52.00 01/17/2024   Lab Results  Component Value Date   LDLCALC 67 01/17/2024   Lab Results  Component Value Date   TRIG 87.0 01/17/2024   Lab Results  Component Value Date   CHOLHDL 3 01/17/2024   Lab Results  Component Value Date   CREATININE 1.48 (H) 01/17/2024   Lab Results  Component Value Date   GFR 35.27 (L) 01/17/2024      Component Value Date/Time   NA 141 01/17/2024 1431   NA 143 01/02/2019 1258   K 4.7 01/17/2024 1431   CL 104 01/17/2024 1431   CO2 29 01/17/2024 1431   GLUCOSE 90 01/17/2024 1431   BUN 24 (H) 01/17/2024 1431   BUN 31 (H) 01/02/2019 1258   CREATININE 1.48 (H) 01/17/2024 1431   CREATININE 1.35 (H) 11/30/2023 1106   CALCIUM  10.1 01/17/2024 1431   PROT 7.6 01/17/2024  1431   PROT 7.6 01/02/2019 1258   ALBUMIN 4.4 01/17/2024 1431   ALBUMIN 4.3 01/02/2019 1258   AST 21 01/17/2024 1431   ALT 15 01/17/2024 1431   ALKPHOS 88 01/17/2024 1431   BILITOT 0.6 01/17/2024 1431   BILITOT 0.3 01/02/2019 1258   GFRNONAA 38 (L) 07/27/2020 1113   GFRNONAA 36 (L) 07/10/2014 1354   GFRAA 44 (L) 07/27/2020 1113   GFRAA 41 (L) 07/10/2014 1354      Latest Ref Rng & Units 01/17/2024    2:31 PM 11/30/2023   11:06 AM 07/20/2023    2:27 PM  BMP  Glucose 70 - 99 mg/dL 90  98  48   BUN 6 - 23 mg/dL 24  30  19    Creatinine 0.40 - 1.20 mg/dL 8.51  8.64  8.48   BUN/Creat Ratio 6 - 22 (calc)  22    Sodium 135 - 145 mEq/L 141  142  139   Potassium 3.5 - 5.1 mEq/L 4.7  4.8  3.9   Chloride 96 - 112 mEq/L 104  106  104   CO2 19 - 32 mEq/L 29  30  30    Calcium  8.4 - 10.5 mg/dL 89.8  9.6    9.6  8.9    8.4        Component Value Date/Time   WBC 4.4 01/17/2024 1431   RBC 3.70 (L) 01/17/2024 1431   HGB 12.0 01/17/2024 1431   HGB 10.9 (L) 01/30/2018 1000   HCT 35.3 (L) 01/17/2024 1431   HCT 30.8 (L) 01/30/2018 1000   PLT 200.0 01/17/2024 1431   PLT 226 01/30/2018 1000   MCV 95.4 01/17/2024 1431   MCV 91 01/30/2018 1000   MCH 31.4 07/27/2020 1113   MCHC 34.1 01/17/2024 1431   RDW 13.0 01/17/2024 1431   RDW 14.8 01/30/2018 1000   LYMPHSABS 1.1 01/17/2024 1431   LYMPHSABS 1.7 01/30/2018 1000   MONOABS 0.3 01/17/2024 1431   EOSABS 0.1 01/17/2024 1431   EOSABS 0.3 01/30/2018 1000   BASOSABS 0.0 01/17/2024 1431   BASOSABS 0.0 01/30/2018 1000   Lab Results  Component Value Date   TSH 0.54 01/17/2024   TSH 1.21 01/10/2023  TSH 1.01 12/31/2021   FREET4 1.16 03/07/2018         Parts of this note may have been dictated using voice recognition software. There may be variances in spelling and vocabulary which are unintentional. Not all errors are proofread. Please notify the dino if any discrepancies are noted or if the meaning of any statement is not clear.

## 2023-12-21 ENCOUNTER — Ambulatory Visit: Payer: Medicare Other | Admitting: "Endocrinology

## 2024-01-03 ENCOUNTER — Encounter: Payer: Self-pay | Admitting: Podiatry

## 2024-01-03 ENCOUNTER — Ambulatory Visit (INDEPENDENT_AMBULATORY_CARE_PROVIDER_SITE_OTHER): Payer: Medicare Other | Admitting: Podiatry

## 2024-01-03 DIAGNOSIS — M79675 Pain in left toe(s): Secondary | ICD-10-CM

## 2024-01-03 DIAGNOSIS — B351 Tinea unguium: Secondary | ICD-10-CM

## 2024-01-03 DIAGNOSIS — Z794 Long term (current) use of insulin: Secondary | ICD-10-CM | POA: Diagnosis not present

## 2024-01-03 DIAGNOSIS — E1142 Type 2 diabetes mellitus with diabetic polyneuropathy: Secondary | ICD-10-CM | POA: Diagnosis not present

## 2024-01-03 DIAGNOSIS — M79674 Pain in right toe(s): Secondary | ICD-10-CM

## 2024-01-03 NOTE — Progress Notes (Signed)

## 2024-01-17 ENCOUNTER — Encounter: Payer: Self-pay | Admitting: Internal Medicine

## 2024-01-17 ENCOUNTER — Ambulatory Visit (INDEPENDENT_AMBULATORY_CARE_PROVIDER_SITE_OTHER): Payer: Medicare Other | Admitting: Internal Medicine

## 2024-01-17 VITALS — BP 126/72 | HR 76 | Temp 98.6°F | Ht 62.5 in | Wt 184.0 lb

## 2024-01-17 DIAGNOSIS — E611 Iron deficiency: Secondary | ICD-10-CM | POA: Diagnosis not present

## 2024-01-17 DIAGNOSIS — I10 Essential (primary) hypertension: Secondary | ICD-10-CM | POA: Diagnosis not present

## 2024-01-17 DIAGNOSIS — E7800 Pure hypercholesterolemia, unspecified: Secondary | ICD-10-CM

## 2024-01-17 DIAGNOSIS — E559 Vitamin D deficiency, unspecified: Secondary | ICD-10-CM

## 2024-01-17 DIAGNOSIS — E538 Deficiency of other specified B group vitamins: Secondary | ICD-10-CM

## 2024-01-17 DIAGNOSIS — J069 Acute upper respiratory infection, unspecified: Secondary | ICD-10-CM | POA: Diagnosis not present

## 2024-01-17 DIAGNOSIS — Z794 Long term (current) use of insulin: Secondary | ICD-10-CM

## 2024-01-17 DIAGNOSIS — E1142 Type 2 diabetes mellitus with diabetic polyneuropathy: Secondary | ICD-10-CM | POA: Diagnosis not present

## 2024-01-17 DIAGNOSIS — N1832 Chronic kidney disease, stage 3b: Secondary | ICD-10-CM | POA: Diagnosis not present

## 2024-01-17 LAB — CBC WITH DIFFERENTIAL/PLATELET
Basophils Absolute: 0 10*3/uL (ref 0.0–0.1)
Basophils Relative: 0.9 % (ref 0.0–3.0)
Eosinophils Absolute: 0.1 10*3/uL (ref 0.0–0.7)
Eosinophils Relative: 1.3 % (ref 0.0–5.0)
HCT: 35.3 % — ABNORMAL LOW (ref 36.0–46.0)
Hemoglobin: 12 g/dL (ref 12.0–15.0)
Lymphocytes Relative: 26 % (ref 12.0–46.0)
Lymphs Abs: 1.1 10*3/uL (ref 0.7–4.0)
MCHC: 34.1 g/dL (ref 30.0–36.0)
MCV: 95.4 fl (ref 78.0–100.0)
Monocytes Absolute: 0.3 10*3/uL (ref 0.1–1.0)
Monocytes Relative: 5.9 % (ref 3.0–12.0)
Neutro Abs: 2.9 10*3/uL (ref 1.4–7.7)
Neutrophils Relative %: 65.9 % (ref 43.0–77.0)
Platelets: 200 10*3/uL (ref 150.0–400.0)
RBC: 3.7 Mil/uL — ABNORMAL LOW (ref 3.87–5.11)
RDW: 13 % (ref 11.5–15.5)
WBC: 4.4 10*3/uL (ref 4.0–10.5)

## 2024-01-17 LAB — VITAMIN D 25 HYDROXY (VIT D DEFICIENCY, FRACTURES): VITD: 61.89 ng/mL (ref 30.00–100.00)

## 2024-01-17 LAB — BASIC METABOLIC PANEL
BUN: 24 mg/dL — ABNORMAL HIGH (ref 6–23)
CO2: 29 meq/L (ref 19–32)
Calcium: 10.1 mg/dL (ref 8.4–10.5)
Chloride: 104 meq/L (ref 96–112)
Creatinine, Ser: 1.48 mg/dL — ABNORMAL HIGH (ref 0.40–1.20)
GFR: 35.27 mL/min — ABNORMAL LOW (ref >60.00)
Glucose, Bld: 90 mg/dL (ref 70–99)
Potassium: 4.7 meq/L (ref 3.5–5.1)
Sodium: 141 meq/L (ref 135–145)

## 2024-01-17 LAB — HEPATIC FUNCTION PANEL
ALT: 15 U/L (ref 0–35)
AST: 21 U/L (ref 0–37)
Albumin: 4.4 g/dL (ref 3.5–5.2)
Alkaline Phosphatase: 88 U/L (ref 39–117)
Bilirubin, Direct: 0.2 mg/dL (ref 0.0–0.3)
Total Bilirubin: 0.6 mg/dL (ref 0.2–1.2)
Total Protein: 7.6 g/dL (ref 6.0–8.3)

## 2024-01-17 LAB — LIPID PANEL
Cholesterol: 137 mg/dL (ref 0–200)
HDL: 52 mg/dL (ref >39.00)
LDL Cholesterol: 67 mg/dL (ref 0–99)
NonHDL: 84.76
Total CHOL/HDL Ratio: 3
Triglycerides: 87 mg/dL (ref 0.0–149.0)
VLDL: 17.4 mg/dL (ref 0.0–40.0)

## 2024-01-17 LAB — IBC PANEL
Iron: 88 ug/dL (ref 42–145)
Saturation Ratios: 33.4 % (ref 20.0–50.0)
TIBC: 263.2 ug/dL (ref 250.0–450.0)
Transferrin: 188 mg/dL — ABNORMAL LOW (ref 212.0–360.0)

## 2024-01-17 LAB — VITAMIN B12: Vitamin B-12: 350 pg/mL (ref 211–911)

## 2024-01-17 LAB — TSH: TSH: 0.54 u[IU]/mL (ref 0.35–5.50)

## 2024-01-17 LAB — FERRITIN: Ferritin: 472.3 ng/mL — ABNORMAL HIGH (ref 10.0–291.0)

## 2024-01-17 LAB — HEMOGLOBIN A1C: Hgb A1c MFr Bld: 5.1 % (ref 4.6–6.5)

## 2024-01-17 MED ORDER — AZITHROMYCIN 250 MG PO TABS: ORAL_TABLET | ORAL | 1 refills | Status: AC

## 2024-01-17 MED ORDER — HYDROCODONE BIT-HOMATROP MBR 5-1.5 MG/5ML PO SOLN: 5.0000 mL | Freq: Four times a day (QID) | ORAL | 0 refills | Status: AC | PRN

## 2024-01-17 NOTE — Patient Instructions (Signed)
 Please take all new medication as prescribed - the antibiotic, and cough medicine  Please continue all other medications as before, and refills have been done if requested.  Please have the pharmacy call with any other refills you may need.  Please continue your efforts at being more active, low cholesterol diet, and weight control.  Please keep your appointments with your specialists as you may have planned  Please go to the LAB at the blood drawing area for the tests to be done  You will be contacted by phone if any changes need to be made immediately.  Otherwise, you will receive a letter about your results with an explanation, but please check with MyChart first.  Please make an Appointment to return in 6 months, or sooner if needed

## 2024-01-17 NOTE — Progress Notes (Unsigned)
 Patient ID: Angel French, female   DOB: 01-14-1951, 73 y.o.   MRN: [id number]        Chief Complaint: follow up sinusitis, ckd3a, htn, hld, dm, low vit d       HPI:  Angel French is a 73 y.o. female  Here with 2-3 days acute onset fever, facial pain, pressure, headache, general weakness and malaise, and greenish d/c, with mild ST and cough, but pt denies chest pain, wheezing, increased sob or doe, orthopnea, PND, increased LE swelling, palpitations, dizziness or syncope.  Pt denies polydipsia, polyuria, or new focal neuro s/s.    Pt denies fever, wt loss, night sweats, loss of appetite, or other constitutional symptoms        Wt Readings from Last 3 Encounters:  01/17/24 184 lb (83.5 kg)  12/07/23 191 lb 3.2 oz (86.7 kg)  07/20/23 184 lb (83.5 kg)   BP Readings from Last 3 Encounters:  01/17/24 126/72  12/07/23 112/60  07/20/23 118/62         Past Medical History:  Diagnosis Date   Allergy    generic allergy pill; Spring and Fall only.   Anxiety    Arthritis    DDD lumbar, R hip OA.  s/p ortho consult in past.   Blood transfusion without reported diagnosis    Mountain climbing accident in [place].   Brachial plexus disorders    Cataract    B retractions.   Chronic kidney disease    stage 3 per pt.    Chronic pain syndrome    Chronic renal insufficiency, stage 3 (moderate) (HCC)    Constipation    DDD (degenerative disc disease), lumbar    Depression    Diabetes mellitus    Diabetic peripheral neuropathy associated with type 2 diabetes mellitus (HCC)    Diabetic retinopathy (HCC)    Diabetic retinopathy associated with type 2 diabetes mellitus (HCC)    s/p laser treatment multiple.  Unable to drive.   Fatty liver    Fibromyalgia    Food allergy    GERD (gastroesophageal reflux disease)    Hypercholesteremia    Hyperlipidemia    Hypertension    controlled, off meds    IBS (irritable bowel syndrome)    Leg edema    Neuromuscular disorder (HCC)    OSA (obstructive  sleep apnea)    Osteoarthritis    Rheumatic fever    Rheumatoid arthritis (HCC)    Stomach ulcer    Swallowing difficulty    TIA (transient ischemic attack)    Ulcer    Peptic ulcer H. Pylori + s/p treatment.  Upper GI diagnosed.[name] Prilosec PRN .   Past Surgical History:  Procedure Laterality Date    2 SPINAL INJECTIONS      ABDOMINAL HYSTERECTOMY  11/02/1979   DUB; cervical dysplasia; ovaries intact.   ABDOMINAL SURGERY     staph abcess    [facility] Admission     age 2; three months in [place].   BIOPSY  07/26/2020   Procedure: BIOPSY;  Surgeon: Willis Modena, MD;  Location: [facility];  Service: Endoscopy;;   BREAST BIOPSY     CARDIAC CATHETERIZATION  11/02/2007   normal coronary arteries.   CARPAL TUNNEL RELEASE     Bilateral.   CATARACT EXTRACTION, BILATERAL     CHOLECYSTECTOMY     ESOPHAGEAL MANOMETRY N/A 09/14/2017   Procedure: ESOPHAGEAL MANOMETRY (EM);  Surgeon: Kerin Salen, MD;  Location: [facility];  Service: Gastroenterology;  Laterality: N/A;   ESOPHAGOGASTRODUODENOSCOPY (EGD) WITH PROPOFOL N/A 07/26/2020   Procedure: ESOPHAGOGASTRODUODENOSCOPY (EGD) WITH PROPOFOL;  Surgeon: Willis Modena, MD;  Location: [facility];  Service: Endoscopy;  Laterality: N/A;   EYE SURGERY     Cataracts B. Laser surgery x 7 for Diabetic Retinopathy   TONSILLECTOMY      reports that she has quit smoking. Her smoking use included cigarettes. She has never used smokeless tobacco. She reports that she does not drink alcohol and does not use drugs. family history includes Breast cancer in her daughter. She was adopted. Allergies  Allergen Reactions   Codeine Anaphylaxis   Iodinated Contrast Media Anaphylaxis    Other reaction(s): respiratory distress   Nitrofurantoin Monohyd Macro Anaphylaxis   Betadine [Povidone Iodine] Itching   Folic Acid Itching   Gabapentin Other (See Comments)    Makes patient feel drunk   Iodine Hives   Lyrica [Pregabalin] Other (See  Comments)    Makes patient feel drunk   Red Dye #40 (Allura Red) Itching    Other reaction(s): Unknown   Ultram [Tramadol Hcl] Nausea And Vomiting   Current Outpatient Medications on File Prior to Visit  Medication Sig Dispense Refill   acetaminophen (TYLENOL) 325 MG tablet Take 650 mg by mouth every 6 (six) hours as needed (every 6 weeks before RA infusion).     alendronate (FOSAMAX) 70 MG tablet Take 1 tablet (70 mg total) by mouth every 7 (seven) days. Take with a full glass of water on an empty stomach. 12 tablet 3   allopurinol (ZYLOPRIM) 100 MG tablet Take 1 tablet (100 mg total) by mouth daily. Ov needed 90 tablet 3   amLODipine (NORVASC) 2.5 MG tablet Take 2.5 mg by mouth daily in the afternoon.     bacitracin 500 UNIT/GM ointment      cholecalciferol (VITAMIN D3) 25 MCG (1000 UNIT) tablet Take 1,000 Units by mouth daily.     clotrimazole-betamethasone (LOTRISONE) cream Apply 1 Application topically 2 (two) times daily. 30 g 0   Continuous Blood Gluc Receiver (FREESTYLE LIBRE 3 READER) DEVI 1 Device by Does not apply route daily. 1 each 0   Continuous Blood Gluc Sensor (FREESTYLE LIBRE 3 SENSOR) MISC Place 1 sensor on the skin every 14 days. Use to check glucose continuously E11.9 2 each 11   diclofenac Sodium (VOLTAREN) 1 % GEL APPLY AS DIRECTED FOUR TIMES DAILY AS NEEDED     diphenhydrAMINE (BENADRYL) 25 MG tablet Take 25 mg by mouth every 6 (six) hours as needed (every 6 weeks prior to RA infusion).     escitalopram (LEXAPRO) 20 MG tablet Take by mouth.     estradiol (ESTRACE) 0.1 MG/GM vaginal cream Place vaginally.     Fingerstix Lancets MISC Use 1 each once daily Dx: DMII non-insulin without complications.  E11.9     glucose blood (PRECISION QID TEST) test strip 1 each (1 strip total) once daily Dx: E11.9     HYDROcodone-acetaminophen (NORCO) 10-325 MG tablet Take 1 tablet by mouth every 6 (six) hours as needed.     hydroxychloroquine (PLAQUENIL) 200 MG tablet Take 200 mg by  mouth 2 (two) times daily.     inFLIXimab (REMICADE) 100 MG injection      leflunomide (ARAVA) 20 MG tablet 1 tablet Orally Once a day for 30 day(s)     linaclotide (LINZESS) 145 MCG CAPS capsule Take 1 capsule (145 mcg total) by mouth daily before breakfast. 30 capsule 11   Multiple Vitamin (MULTIVITAMIN WITH  MINERALS) TABS tablet Take 1 tablet by mouth daily.     mupirocin ointment (BACTROBAN) 2 % APPLY TO AFFECTED AREA 3 TIMES A DAY     neomycin-polymyxin-hydrocortisone (CORTISPORIN) OTIC solution Place 4 drops into the left ear 4 (four) times daily. 10 mL 0   nystatin cream (MYCOSTATIN) Apply topically 2 (two) times daily.     omeprazole (PRILOSEC) 20 MG capsule TAKE ONE (1) CAPSULE BY MOUTH TWICE DAILY BEFORE MEALS 180 capsule 3   oxybutynin (DITROPAN XL) 15 MG 24 hr tablet TAKE 1 TABLET BY MOUTH AT BEDTIME 90 tablet 3   repaglinide (PRANDIN) 1 MG tablet Take 1 tablet (1 mg total) by mouth 3 (three) times daily before meals. 270 tablet 1   rosuvastatin (CRESTOR) 20 MG tablet TAKE 1 TABLET BY MOUTH EVERY DAY 90 tablet 3   tirzepatide (MOUNJARO) 2.5 MG/0.5ML Pen Inject 2.5 mg into the skin once a week. 2 mL 11   traZODone (DESYREL) 100 MG tablet TAKE 2 TABLETS BY MOUTH EVERY DAY AT BEDTIME (Patient taking differently: Take 200 mg by mouth at bedtime.) 180 tablet 1   No current facility-administered medications on file prior to visit.        ROS:  All others reviewed and negative.  Objective        PE:  BP 126/72 (BP Location: Right Arm, Patient Position: Sitting, Cuff Size: Normal)   Pulse 76   Temp 98.6 F (37 C) (Oral)   Ht 5' 2.5" (1.588 m)   Wt 184 lb (83.5 kg)   SpO2 100%   BMI 33.12 kg/m                 Constitutional: Pt appears in NAD               HENT: Head: NCAT.                Right Ear: External ear normal.                 Left Ear: External ear normal. Bilat tm's with mild erythema.  Max sinus areas mild tender.  Pharynx with mild erythema, no exudate                Eyes: . Pupils are equal, round, and reactive to light. Conjunctivae and EOM are normal               Nose: without d/c or deformity               Neck: Neck supple. Gross normal ROM               Cardiovascular: Normal rate and regular rhythm.                 Pulmonary/Chest: Effort normal and breath sounds without rales or wheezing.                Abd:  Soft, NT, ND, + BS, no organomegaly               Neurological: Pt is alert. At baseline orientation, motor grossly intact               Skin: Skin is warm. No rashes, no other new lesions, LE edema - none               Psychiatric: Pt behavior is normal without agitation   Micro: none  Cardiac tracings I have personally interpreted today:  none  Pertinent Radiological findings (  summarize): none   Lab Results  Component Value Date   WBC 4.4 01/17/2024   HGB 12.0 01/17/2024   HCT 35.3 (L) 01/17/2024   PLT 200.0 01/17/2024   GLUCOSE 90 01/17/2024   CHOL 137 01/17/2024   TRIG 87.0 01/17/2024   HDL 52.00 01/17/2024   LDLDIRECT 62.0 07/02/2022   LDLCALC 67 01/17/2024   ALT 15 01/17/2024   AST 21 01/17/2024   NA 141 01/17/2024   K 4.7 01/17/2024   CL 104 01/17/2024   CREATININE 1.48 (H) 01/17/2024   BUN 24 (H) 01/17/2024   CO2 29 01/17/2024   TSH 0.54 01/17/2024   INR 0.9 06/23/2020   HGBA1C 5.1 01/17/2024   MICROALBUR 2.4 (H) 01/17/2024   Assessment/Plan:  ARELIE KUZEL is a 73 y.o. White or Caucasian [1] female with  has a past medical history of Allergy, Anxiety, Arthritis, Blood transfusion without reported diagnosis, Brachial plexus disorders, Cataract, Chronic kidney disease, Chronic pain syndrome, Chronic renal insufficiency, stage 3 (moderate) (HCC), Constipation, DDD (degenerative disc disease), lumbar, Depression, Diabetes mellitus, Diabetic peripheral neuropathy associated with type 2 diabetes mellitus (HCC), Diabetic retinopathy (HCC), Diabetic retinopathy associated with type 2 diabetes mellitus (HCC), Fatty liver,  Fibromyalgia, Food allergy, GERD (gastroesophageal reflux disease), Hypercholesteremia, Hyperlipidemia, Hypertension, IBS (irritable bowel syndrome), Leg edema, Neuromuscular disorder (HCC), OSA (obstructive sleep apnea), Osteoarthritis, Rheumatic fever, Rheumatoid arthritis (HCC), Stomach ulcer, Swallowing difficulty, TIA (transient ischemic attack), and Ulcer.  Acute upper respiratory infection Mild to mod, for antibx course zpack, cough med prn,,  to f/u any worsening symptoms or concerns  CKD (chronic kidney disease) stage 3, GFR 30-59 ml/min (HCC) Lab Results  Component Value Date   CREATININE 1.48 (H) 01/17/2024   Stable overall, cont to avoid nephrotoxins   Essential hypertension, benign BP Readings from Last 3 Encounters:  01/17/24 126/72  12/07/23 112/60  07/20/23 118/62   Stable, pt to continue medical treatment norvasc 2.5 qd   Pure hypercholesterolemia Lab Results  Component Value Date   LDLCALC 67 01/17/2024   Stable, pt to continue current statin cretsor 20 qd   Type 2 diabetes mellitus with diabetic polyneuropathy, with long-term current use of insulin (HCC) Lab Results  Component Value Date   HGBA1C 5.1 01/17/2024   Stable, pt to continue current medical treatment prandin 1 mg tid, mounjaro 2.5 mg weekly   Vitamin D deficiency Last vitamin D Lab Results  Component Value Date   VD25OH 61.89 01/17/2024   Stable, cont oral replacement  Followup: Return in about 6 months (around 07/19/2024).  Oliver Barre, MD 01/19/2024 8:39 PM [facility]

## 2024-01-18 ENCOUNTER — Encounter: Payer: Self-pay | Admitting: Internal Medicine

## 2024-01-18 LAB — URINALYSIS, ROUTINE W REFLEX MICROSCOPIC
Bilirubin Urine: NEGATIVE
Hgb urine dipstick: NEGATIVE
Leukocytes,Ua: NEGATIVE
Nitrite: NEGATIVE
Specific Gravity, Urine: 1.02 (ref 1.000–1.030)
Total Protein, Urine: 30 — AB
Urine Glucose: NEGATIVE
Urobilinogen, UA: 1 (ref 0.0–1.0)
pH: 6 (ref 5.0–8.0)

## 2024-01-18 LAB — MICROALBUMIN / CREATININE URINE RATIO
Creatinine,U: 123.4 mg/dL
Microalb Creat Ratio: 19.2 mg/g (ref 0.0–30.0)
Microalb, Ur: 2.4 mg/dL — ABNORMAL HIGH (ref 0.0–1.9)

## 2024-01-18 NOTE — Progress Notes (Signed)
 The test results show that your current treatment is OK, as the tests are stable.  Please continue the same plan.  There is no other need for change of treatment or further evaluation based on these results, at this time.  thanks

## 2024-01-19 ENCOUNTER — Encounter: Payer: Self-pay | Admitting: Internal Medicine

## 2024-01-19 NOTE — Assessment & Plan Note (Signed)
 Lab Results  Component Value Date   CREATININE 1.48 (H) 01/17/2024   Stable overall, cont to avoid nephrotoxins

## 2024-01-19 NOTE — Assessment & Plan Note (Signed)
 BP Readings from Last 3 Encounters:  01/17/24 126/72  12/07/23 112/60  07/20/23 118/62   Stable, pt to continue medical treatment norvasc 2.5 qd

## 2024-01-19 NOTE — Assessment & Plan Note (Signed)
 Lab Results  Component Value Date   HGBA1C 5.1 01/17/2024   Stable, pt to continue current medical treatment prandin 1 mg tid, mounjaro 2.5 mg weekly

## 2024-01-19 NOTE — Assessment & Plan Note (Signed)
Mild to mod, for antibx course zpack, cough med prn,,  to f/u any worsening symptoms or concerns 

## 2024-01-19 NOTE — Assessment & Plan Note (Signed)
 Last vitamin D Lab Results  Component Value Date   VD25OH 61.89 01/17/2024   Stable, cont oral replacement

## 2024-01-19 NOTE — Assessment & Plan Note (Signed)
 Lab Results  Component Value Date   LDLCALC 67 01/17/2024   Stable, pt to continue current statin cretsor 20 qd

## 2024-02-09 ENCOUNTER — Ambulatory Visit (INDEPENDENT_AMBULATORY_CARE_PROVIDER_SITE_OTHER): Admitting: "Endocrinology

## 2024-02-09 ENCOUNTER — Encounter: Payer: Self-pay | Admitting: "Endocrinology

## 2024-02-09 VITALS — BP 130/80 | HR 86 | Ht 62.0 in | Wt 187.0 lb

## 2024-02-09 DIAGNOSIS — E7800 Pure hypercholesterolemia, unspecified: Secondary | ICD-10-CM | POA: Diagnosis not present

## 2024-02-09 DIAGNOSIS — E213 Hyperparathyroidism, unspecified: Secondary | ICD-10-CM | POA: Diagnosis not present

## 2024-02-09 DIAGNOSIS — Z7985 Long-term (current) use of injectable non-insulin antidiabetic drugs: Secondary | ICD-10-CM

## 2024-02-09 DIAGNOSIS — E1142 Type 2 diabetes mellitus with diabetic polyneuropathy: Secondary | ICD-10-CM

## 2024-02-09 DIAGNOSIS — M85852 Other specified disorders of bone density and structure, left thigh: Secondary | ICD-10-CM

## 2024-02-09 MED ORDER — TRULICITY 0.75 MG/0.5ML ~~LOC~~ SOAJ: 0.7500 mg | SUBCUTANEOUS | 0 refills | Status: AC

## 2024-02-09 NOTE — Progress Notes (Signed)
 Outpatient Endocrinology Note Angel Frankfort, MD    Angel French 1951/02/18 [id number]  Referring Provider: Corwin Levins, MD Primary Care Provider: Corwin Levins, MD Reason for consultation: Subjective   Assessment & Plan  Diagnoses and all orders for this visit:  Hyperparathyroidism (HCC) -     PTH, intact and calcium -     Renal function panel  Type 2 diabetes mellitus with diabetic polyneuropathy, without long-term current use of insulin (HCC)  Osteopenia of neck of left femur  Pure hypercholesterolemia  Other orders -     Dulaglutide (TRULICITY) 0.75 MG/0.5ML SOAJ; Inject 0.75 mg into the skin once a week.   Patient had a high PTH of 129 in 07/2023, which last improved to 85.  25 vitamin D was toxic high at 1 point but now has been normal to 55. 1, 25 vitamin D was on low normal side at 18, with history of CKD with creatinine of 1.35 and phosphorus elevated at 4.5.  Calcium was within normal limits. Also has history of osteopenia with T-score of -2.4 at femoral left neck in 11/2023 DEXA scan. High phosphorus could be due to CKD which could be due to diabetes or other reasons On Fosamax 70 mg weekly, Vit D 1000 units a day, no calcium  Start calcium 600 mg bid   Recommend to continue trending PTH with repeat labs in 3 months. If continued PTH elevation, will give a trial of calcitriol  Patient has a history of diabetes, current A1c at 5.1% Checks FBS, runs in 80s Patient was on insulin at one point but currently take repaglinide 1 mg 3 times a day Seems like Mounjaro/libre were ordered but may not be covered by insurance On rosuvastatin 20 gm qd, MA/Cr WNL Ordered Trulicity, discussed S/E   No history of MEN syndrome/medullary thyroid cancer/pancreatitis or pancreatic cancer in self or family Will follow-up on next visit  Return in about 3 months (around 05/10/2024) for visit, labs today.   I have reviewed current medications, nurse's notes, allergies, vital  signs, past medical and surgical history, family medical history, and social history for this encounter. Counseled patient on symptoms, examination findings, lab findings, imaging results, treatment decisions and monitoring and prognosis. The patient understood the recommendations and agrees with the treatment plan. All questions regarding treatment plan were fully answered.  Angel French Lick, MD  02/09/24   History of Present Illness HPI   Angel French is a 73 y.o. female referred by Dr. Jonny Ruiz for evaluation and management of hyperparathyroidism.  Patient also interested in discussing her diabetes care.  Reports falls all the time Denies fractures No abdominal pain/nausea/vomiting Always constipated and gets some heart burn  Checks BG fasting, ranging in 80s On prandin 1 mg tid, rosuvastatin 20 mg every day  Initial history:   Patient had a high PTH of 129 in 07/2023, which is now improved to 85.  25 vitamin D was toxic high at 1 point but now has been normal to 55.  125 vitamin D is on low normal side at 18, with history of CKD with creatinine of 1.35 and phosphorus elevated at 4.5.  Patient is not taking any kind of calcium/vitamin D/multivitamin.  Also has history of osteopenia with T-score of -2.4 at femoral left neck in 11/2023 DEXA scan.  Patient denies a history of kidney stones  She  current hematuria No polyuria Yes, has urinary incontinence  nocturia Yes thirst No renal failure Yes, has CKD  anorexia  No abdominal pain No heartburn Yes, has prilosec  constipation Yes, has IBS nausea or vomiting No history of peptic ulcer disease Yes depression Yes, on lexapro  confusion No excessive fatigue No fracture Yes osteoporosis No, hjas osteopenia  headaches No numbness No tingling No  She takes Calcium No She takes Vitamin D supplements No  She a history of taking chronic lithium No She a recent history of thiazide diuretic intake No  She family history of renal  stones/hypercalcemia No a personal history of MEN syndromes/medullary thyroid cancer/ pheochromocytoma No  Physical Exam  BP 130/80   Pulse 86   Ht 5\' 2"  (1.575 m)   Wt 187 lb (84.8 kg)   SpO2 99%   BMI 34.20 kg/m    Constitutional: well developed, well nourished Head: normocephalic, atraumatic Eyes: sclera anicteric, no redness Neck: supple Lungs: normal respiratory effort Neurology: alert and oriented Skin: dry, no appreciable rashes Musculoskeletal: no appreciable defects Psychiatric: normal mood and affect   Current Medications Patient's Medications  New Prescriptions   DULAGLUTIDE (TRULICITY) 0.75 MG/0.5ML SOAJ    Inject 0.75 mg into the skin once a week.  Previous Medications   ACETAMINOPHEN (TYLENOL) 325 MG TABLET    Take 650 mg by mouth every 6 (six) hours as needed (every 6 weeks before RA infusion).   ALENDRONATE (FOSAMAX) 70 MG TABLET    Take 1 tablet (70 mg total) by mouth every 7 (seven) days. Take with a full glass of water on an empty stomach.   ALLOPURINOL (ZYLOPRIM) 100 MG TABLET    Take 1 tablet (100 mg total) by mouth daily. Ov needed   AMLODIPINE (NORVASC) 2.5 MG TABLET    Take 2.5 mg by mouth daily in the afternoon.   BACITRACIN 500 UNIT/GM OINTMENT       CHOLECALCIFEROL (VITAMIN D3) 25 MCG (1000 UNIT) TABLET    Take 1,000 Units by mouth daily.   CLOTRIMAZOLE-BETAMETHASONE (LOTRISONE) CREAM    Apply 1 Application topically 2 (two) times daily.   CONTINUOUS BLOOD GLUC RECEIVER (FREESTYLE LIBRE 3 READER) DEVI    1 Device by Does not apply route daily.   CONTINUOUS BLOOD GLUC SENSOR (FREESTYLE LIBRE 3 SENSOR) MISC    Place 1 sensor on the skin every 14 days. Use to check glucose continuously E11.9   DICLOFENAC SODIUM (VOLTAREN) 1 % GEL    APPLY AS DIRECTED FOUR TIMES DAILY AS NEEDED   DIPHENHYDRAMINE (BENADRYL) 25 MG TABLET    Take 25 mg by mouth every 6 (six) hours as needed (every 6 weeks prior to RA infusion).   ESCITALOPRAM (LEXAPRO) 20 MG TABLET    Take by  mouth.   ESTRADIOL (ESTRACE) 0.1 MG/GM VAGINAL CREAM    Place vaginally.   FINGERSTIX LANCETS MISC    Use 1 each once daily Dx: DMII non-insulin without complications.  E11.9   GLUCOSE BLOOD (PRECISION QID TEST) TEST STRIP    1 each (1 strip total) once daily Dx: E11.9   HYDROCODONE-ACETAMINOPHEN (NORCO) 10-325 MG TABLET    Take 1 tablet by mouth every 6 (six) hours as needed.   HYDROXYCHLOROQUINE (PLAQUENIL) 200 MG TABLET    Take 200 mg by mouth 2 (two) times daily.   INFLIXIMAB (REMICADE) 100 MG INJECTION       LEFLUNOMIDE (ARAVA) 20 MG TABLET    1 tablet Orally Once a day for 30 day(s)   LINACLOTIDE (LINZESS) 145 MCG CAPS CAPSULE    Take 1 capsule (145 mcg total) by mouth daily before  breakfast.   MULTIPLE VITAMIN (MULTIVITAMIN WITH MINERALS) TABS TABLET    Take 1 tablet by mouth daily.   MUPIROCIN OINTMENT (BACTROBAN) 2 %    APPLY TO AFFECTED AREA 3 TIMES A DAY   NEOMYCIN-POLYMYXIN-HYDROCORTISONE (CORTISPORIN) OTIC SOLUTION    Place 4 drops into the left ear 4 (four) times daily.   NYSTATIN CREAM (MYCOSTATIN)    Apply topically 2 (two) times daily.   OMEPRAZOLE (PRILOSEC) 20 MG CAPSULE    TAKE ONE (1) CAPSULE BY MOUTH TWICE DAILY BEFORE MEALS   OXYBUTYNIN (DITROPAN XL) 15 MG 24 HR TABLET    TAKE 1 TABLET BY MOUTH AT BEDTIME   REPAGLINIDE (PRANDIN) 1 MG TABLET    Take 1 tablet (1 mg total) by mouth 3 (three) times daily before meals.   ROSUVASTATIN (CRESTOR) 20 MG TABLET    TAKE 1 TABLET BY MOUTH EVERY DAY   TRAZODONE (DESYREL) 100 MG TABLET    TAKE 2 TABLETS BY MOUTH EVERY DAY AT BEDTIME  Modified Medications   No medications on file  Discontinued Medications   TIRZEPATIDE (MOUNJARO) 2.5 MG/0.5ML PEN    Inject 2.5 mg into the skin once a week.    Allergies Allergies  Allergen Reactions   Codeine Anaphylaxis   Iodinated Contrast Media Anaphylaxis    Other reaction(s): respiratory distress   Nitrofurantoin Monohyd Macro Anaphylaxis   Betadine [Povidone Iodine] Itching   Folic Acid  Itching   Gabapentin Other (See Comments)    Makes patient feel drunk   Iodine Hives   Lyrica [Pregabalin] Other (See Comments)    Makes patient feel drunk   Red Dye #40 (Allura Red) Itching    Other reaction(s): Unknown   Ultram [Tramadol Hcl] Nausea And Vomiting    Past Medical History Past Medical History:  Diagnosis Date   Allergy    generic allergy pill; Spring and Fall only.   Anxiety    Arthritis    DDD lumbar, R hip OA.  s/p ortho consult in past.   Blood transfusion without reported diagnosis    Mountain climbing accident in [place].   Brachial plexus disorders    Cataract    B retractions.   Chronic kidney disease    stage 3 per pt.    Chronic pain syndrome    Chronic renal insufficiency, stage 3 (moderate) (HCC)    Constipation    DDD (degenerative disc disease), lumbar    Depression    Diabetes mellitus    Diabetic peripheral neuropathy associated with type 2 diabetes mellitus (HCC)    Diabetic retinopathy (HCC)    Diabetic retinopathy associated with type 2 diabetes mellitus (HCC)    s/p laser treatment multiple.  Unable to drive.   Fatty liver    Fibromyalgia    Food allergy    GERD (gastroesophageal reflux disease)    Hypercholesteremia    Hyperlipidemia    Hypertension    controlled, off meds    IBS (irritable bowel syndrome)    Leg edema    Neuromuscular disorder (HCC)    OSA (obstructive sleep apnea)    Osteoarthritis    Rheumatic fever    Rheumatoid arthritis (HCC)    Stomach ulcer    Swallowing difficulty    TIA (transient ischemic attack)    Ulcer    Peptic ulcer H. Pylori + s/p treatment.  Upper GI diagnosed.[name] Prilosec PRN .    Past Surgical History Past Surgical History:  Procedure Laterality Date    2 SPINAL  INJECTIONS      ABDOMINAL HYSTERECTOMY  11/02/1979   DUB; cervical dysplasia; ovaries intact.   ABDOMINAL SURGERY     staph abcess    [facility] Admission     age 22; three months in [place].   BIOPSY   07/26/2020   Procedure: BIOPSY;  Surgeon: Willis Modena, MD;  Location: [facility];  Service: Endoscopy;;   BREAST BIOPSY     CARDIAC CATHETERIZATION  11/02/2007   normal coronary arteries.   CARPAL TUNNEL RELEASE     Bilateral.   CATARACT EXTRACTION, BILATERAL     CHOLECYSTECTOMY     ESOPHAGEAL MANOMETRY N/A 09/14/2017   Procedure: ESOPHAGEAL MANOMETRY (EM);  Surgeon: Kerin Salen, MD;  Location: [facility];  Service: Gastroenterology;  Laterality: N/A;   ESOPHAGOGASTRODUODENOSCOPY (EGD) WITH PROPOFOL N/A 07/26/2020   Procedure: ESOPHAGOGASTRODUODENOSCOPY (EGD) WITH PROPOFOL;  Surgeon: Willis Modena, MD;  Location: [facility];  Service: Endoscopy;  Laterality: N/A;   EYE SURGERY     Cataracts B. Laser surgery x 7 for Diabetic Retinopathy   TONSILLECTOMY      Family History family history includes Breast cancer in her daughter. She was adopted.  Social History Social History   Socioeconomic History   Marital status: Married    Spouse name: Quarry manager   Number of children: 2   Years of education: college   Highest education level: Not on file  Occupational History   Occupation: retired    Comment: retretied  Tobacco Use   Smoking status: Former    Types: Cigarettes   Smokeless tobacco: Never   Tobacco comments:    Quit 1987  Vaping Use   Vaping status: Never Used  Substance and Sexual Activity   Alcohol use: No    Alcohol/week: 0.0 standard drinks of alcohol   Drug use: No   Sexual activity: Yes    Birth control/protection: Surgical, Post-menopausal    Comment: widow  Other Topics Concern   Not on file  Social History Narrative   Marital status: widowed since 2009; dating x 6 years.  Happy; no abuse.      Children: 2 children (35 daughter, 53 son estranged); 2 grandchildren.      Lives: with boyfriend, daughter, granddaughter, friend of daughter.  Lives in pt house.      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  Diabetic retinopathy;  unable to drive.      Tobacco:  Smoked x 20 years; quit 20 years.      Alcohol:  On special occasions; once per week on average.       Drugs:  None since college.      Exercise:  Walking several times per week; walks the dog.   Education college   Caffeine one cup daily.   Right handed      Advanced Directives: none; FULL CODE.  DNR/DNI.  HCPOA: Victorino Dike?           Social Drivers of Health   Financial Resource Strain: High Risk (06/06/2023)   Overall Financial Resource Strain (CARDIA)    Difficulty of Paying Living Expenses: Hard  Food Insecurity: Food Insecurity Present (06/06/2023)   Hunger Vital Sign    Worried About Running Out of Food in the Last Year: Often true    Ran Out of Food in the Last Year: Often true  Transportation Needs: No Transportation Needs (06/06/2023)   PRAPARE - Administrator, Civil Service (Medical): No    Lack of  Transportation (Non-Medical): No  Physical Activity: Unknown (06/06/2023)   Exercise Vital Sign    Days of Exercise per Week: 7 days    Minutes of Exercise per Session: Not on file  Stress: No Stress Concern Present (06/06/2023)   [employer] of Occupational Health - Occupational Stress Questionnaire    Feeling of Stress : Only a little  Social Connections: Socially Integrated (06/06/2023)   Social Connection and Isolation Panel [NHANES]    Frequency of Communication with Friends and Family: More than three times a week    Frequency of Social Gatherings with Friends and Family: Once a week    Attends Religious Services: More than 4 times per year    Active Member of Clubs or Organizations: Yes    Attends Banker Meetings: 1 to 4 times per year    Marital Status: Married  Catering manager Violence: Not At Risk (06/06/2023)   Humiliation, Afraid, Rape, and Kick questionnaire    Fear of Current or Ex-Partner: No    Emotionally Abused: No    Physically Abused: No    Sexually Abused: No    Lab Results  Component Value  Date   CHOL 137 01/17/2024   Lab Results  Component Value Date   HDL 52.00 01/17/2024   Lab Results  Component Value Date   LDLCALC 67 01/17/2024   Lab Results  Component Value Date   TRIG 87.0 01/17/2024   Lab Results  Component Value Date   CHOLHDL 3 01/17/2024   Lab Results  Component Value Date   CREATININE 1.48 (H) 01/17/2024   Lab Results  Component Value Date   GFR 35.27 (L) 01/17/2024      Component Value Date/Time   NA 141 01/17/2024 1431   NA 143 01/02/2019 1258   K 4.7 01/17/2024 1431   CL 104 01/17/2024 1431   CO2 29 01/17/2024 1431   GLUCOSE 90 01/17/2024 1431   BUN 24 (H) 01/17/2024 1431   BUN 31 (H) 01/02/2019 1258   CREATININE 1.48 (H) 01/17/2024 1431   CREATININE 1.35 (H) 11/30/2023 1106   CALCIUM 10.1 01/17/2024 1431   PROT 7.6 01/17/2024 1431   PROT 7.6 01/02/2019 1258   ALBUMIN 4.4 01/17/2024 1431   ALBUMIN 4.3 01/02/2019 1258   AST 21 01/17/2024 1431   ALT 15 01/17/2024 1431   ALKPHOS 88 01/17/2024 1431   BILITOT 0.6 01/17/2024 1431   BILITOT 0.3 01/02/2019 1258   GFRNONAA 38 (L) 07/27/2020 1113   GFRNONAA 36 (L) 07/10/2014 1354   GFRAA 44 (L) 07/27/2020 1113   GFRAA 41 (L) 07/10/2014 1354      Latest Ref Rng & Units 01/17/2024    2:31 PM 11/30/2023   11:06 AM 07/20/2023    2:27 PM  BMP  Glucose 70 - 99 mg/dL 90  98  48   BUN 6 - 23 mg/dL 24  30  19    Creatinine 0.40 - 1.20 mg/dL 1.61  0.96  0.45   BUN/Creat Ratio 6 - 22 (calc)  22    Sodium 135 - 145 mEq/L 141  142  139   Potassium 3.5 - 5.1 mEq/L 4.7  4.8  3.9   Chloride 96 - 112 mEq/L 104  106  104   CO2 19 - 32 mEq/L 29  30  30    Calcium 8.4 - 10.5 mg/dL 40.9  9.6    9.6  8.9    8.4        Component Value Date/Time  WBC 4.4 01/17/2024 1431   RBC 3.70 (L) 01/17/2024 1431   HGB 12.0 01/17/2024 1431   HGB 10.9 (L) 01/30/2018 1000   HCT 35.3 (L) 01/17/2024 1431   HCT 30.8 (L) 01/30/2018 1000   PLT 200.0 01/17/2024 1431   PLT 226 01/30/2018 1000   MCV 95.4 01/17/2024  1431   MCV 91 01/30/2018 1000   MCH 31.4 07/27/2020 1113   MCHC 34.1 01/17/2024 1431   RDW 13.0 01/17/2024 1431   RDW 14.8 01/30/2018 1000   LYMPHSABS 1.1 01/17/2024 1431   LYMPHSABS 1.7 01/30/2018 1000   MONOABS 0.3 01/17/2024 1431   EOSABS 0.1 01/17/2024 1431   EOSABS 0.3 01/30/2018 1000   BASOSABS 0.0 01/17/2024 1431   BASOSABS 0.0 01/30/2018 1000   Lab Results  Component Value Date   TSH 0.54 01/17/2024   TSH 1.21 01/10/2023   TSH 1.01 12/31/2021   FREET4 1.16 03/07/2018         Parts of this note may have been dictated using voice recognition software. There may be variances in spelling and vocabulary which are unintentional. Not all errors are proofread. Please notify the Thereasa Parkin if any discrepancies are noted or if the meaning of any statement is not clear.

## 2024-02-10 LAB — RENAL FUNCTION PANEL
Albumin: 4.2 g/dL (ref 3.6–5.1)
BUN/Creatinine Ratio: 17 (calc) (ref 6–22)
BUN: 24 mg/dL (ref 7–25)
CO2: 29 mmol/L (ref 20–32)
Calcium: 9.6 mg/dL (ref 8.6–10.4)
Chloride: 105 mmol/L (ref 98–110)
Creat: 1.38 mg/dL — ABNORMAL HIGH (ref 0.60–1.00)
Glucose, Bld: 76 mg/dL (ref 65–99)
Phosphorus: 4.2 mg/dL (ref 2.1–4.3)
Potassium: 4.7 mmol/L (ref 3.5–5.3)
Sodium: 142 mmol/L (ref 135–146)

## 2024-02-10 LAB — PTH, INTACT AND CALCIUM
Calcium: 9.6 mg/dL (ref 8.6–10.4)
PTH: 98 pg/mL — ABNORMAL HIGH (ref 16–77)

## 2024-02-21 ENCOUNTER — Other Ambulatory Visit: Payer: Self-pay | Admitting: "Endocrinology

## 2024-02-21 DIAGNOSIS — E213 Hyperparathyroidism, unspecified: Secondary | ICD-10-CM

## 2024-02-21 MED ORDER — CALCITRIOL 0.25 MCG PO CAPS: 0.2500 ug | ORAL_CAPSULE | Freq: Every day | ORAL | 5 refills | Status: DC

## 2024-02-24 ENCOUNTER — Other Ambulatory Visit: Payer: Self-pay | Admitting: Family Medicine

## 2024-02-24 DIAGNOSIS — Z1231 Encounter for screening mammogram for malignant neoplasm of breast: Secondary | ICD-10-CM

## 2024-03-05 ENCOUNTER — Ambulatory Visit: Payer: Medicare Other | Admitting: "Endocrinology

## 2024-04-04 ENCOUNTER — Ambulatory Visit: Admitting: Podiatry

## 2024-04-24 ENCOUNTER — Ambulatory Visit: Admitting: Podiatry

## 2024-05-07 ENCOUNTER — Ambulatory Visit: Admitting: "Endocrinology

## 2024-05-22 LAB — HM DIABETES EYE EXAM

## 2024-05-23 ENCOUNTER — Encounter: Payer: Self-pay | Admitting: Podiatry

## 2024-05-23 ENCOUNTER — Encounter: Payer: Self-pay | Admitting: Internal Medicine

## 2024-05-23 ENCOUNTER — Ambulatory Visit (INDEPENDENT_AMBULATORY_CARE_PROVIDER_SITE_OTHER): Admitting: Podiatry

## 2024-05-23 DIAGNOSIS — E1142 Type 2 diabetes mellitus with diabetic polyneuropathy: Secondary | ICD-10-CM

## 2024-05-23 DIAGNOSIS — B351 Tinea unguium: Secondary | ICD-10-CM | POA: Diagnosis not present

## 2024-05-23 DIAGNOSIS — Z794 Long term (current) use of insulin: Secondary | ICD-10-CM | POA: Diagnosis not present

## 2024-05-23 DIAGNOSIS — M79674 Pain in right toe(s): Secondary | ICD-10-CM

## 2024-05-23 DIAGNOSIS — M79675 Pain in left toe(s): Secondary | ICD-10-CM

## 2024-05-23 NOTE — Progress Notes (Signed)
This patient returns to my office for at risk foot care.  This patient requires this care by a professional since this patient will be at risk due to having diabetes.  This patient is unable to cut nails herself since the patient cannot reach her nails.These nails are painful walking and wearing shoes.  This patient presents for at risk foot care today.  General Appearance  Alert, conversant and in no acute stress.  Vascular  Dorsalis pedis and posterior tibial  pulses are palpable  bilaterally.  Capillary return is within normal limits  bilaterally. Temperature is within normal limits  bilaterally.  Neurologic  Senn-Weinstein monofilament wire test within normal limits  bilaterally. Muscle power within normal limits bilaterally.  Nails Thick disfigured discolored nails with subungual debris  from hallux to fifth toes bilaterally. No evidence of bacterial infection or drainage bilaterally.  Orthopedic  No limitations of motion  feet .  No crepitus or effusions noted.  No bony pathology or digital deformities noted.  Skin  normotropic skin with no porokeratosis noted bilaterally.  No signs of infections or ulcers noted.     Onychomycosis  Pain in right toes  Pain in left toes  Consent was obtained for treatment procedures.   Mechanical debridement of nails 1-5  bilaterally performed with a nail nipper.  Filed with dremel without incident.    Return office visit     10 weeks                 Told patient to return for periodic foot care and evaluation due to potential at risk complications.   Lennyn Bellanca DPM   

## 2024-06-07 ENCOUNTER — Ambulatory Visit: Payer: Medicare Other

## 2024-06-07 VITALS — Ht 62.5 in | Wt 210.0 lb

## 2024-06-07 DIAGNOSIS — Z0000 Encounter for general adult medical examination without abnormal findings: Secondary | ICD-10-CM | POA: Diagnosis not present

## 2024-06-07 NOTE — Patient Instructions (Addendum)
 Angel French , Thank you for taking time out of your busy schedule to complete your Annual Wellness Visit with me. I enjoyed our conversation and look forward to speaking with you again next year. I, as well as your care team,  appreciate your ongoing commitment to your health goals. Please review the following plan we discussed and let me know if I can assist you in the future. Your Game plan/ To Do List    Referrals: If you haven't heard from the office you've been referred to, please reach out to them at the phone provided.   Follow up Visits: We will see or speak with you next year for your Next Medicare AWV with our clinical staff Have you seen your provider in the last 6 months (3 months if uncontrolled diabetes)? No  Clinician Recommendations:  Aim for 30 minutes of exercise or brisk walking, 6-8 glasses of water, and 5 servings of fruits and vegetables each day.       This is a list of the screenings recommended for you:  Health Maintenance  Topic Date Due   Flu Shot  06/01/2024   Mammogram  06/22/2024   Hemoglobin A1C  07/19/2024   Complete foot exam   01/02/2025   Yearly kidney health urinalysis for diabetes  01/16/2025   Yearly kidney function blood test for diabetes  02/08/2025   Eye exam for diabetics  05/22/2025   Medicare Annual Wellness Visit  06/07/2025   DTaP/Tdap/Td vaccine (3 - Td or Tdap) 01/13/2032   Pneumococcal Vaccine for age over 77  Completed   DEXA scan (bone density measurement)  Completed   COVID-19 Vaccine  Completed   Hepatitis C Screening  Completed   Zoster (Shingles) Vaccine  Completed   Hepatitis B Vaccine  Aged Out   HPV Vaccine  Aged Out   Meningitis B Vaccine  Aged Out    Advanced directives: (Copy Requested) Please bring a copy of your health care power of attorney and living will to the office to be added to your chart at your convenience. You can mail to [facility] [address] [place], [place] [address] or email to  [email or web address] Advance Care Planning is important because it:  [x]  Makes sure you receive the medical care that is consistent with your values, goals, and preferences  [x]  It provides guidance to your family and loved ones and reduces their decisional burden about whether or not they are making the right decisions based on your wishes.  Follow the link provided in your after visit summary or read over the paperwork we have mailed to you to help you started getting your Advance Directives in place. If you need assistance in completing these, please reach out to us  so that we can help you!

## 2024-06-07 NOTE — Progress Notes (Signed)
 Subjective:   [name] is a 73 y.o. who presents for a Medicare Wellness preventive visit.  As a reminder, Annual Wellness Visits don't include a physical exam, and some assessments may be limited, especially if this visit is performed virtually. We may recommend an in-person follow-up visit with your provider if needed.  Visit Complete: Virtual I connected with  Malachi K Beaufort on 06/07/24 by a audio enabled telemedicine application and verified that I am speaking with the correct person using two identifiers.  Patient Location: Home  Provider Location: Office/Clinic  I discussed the limitations of evaluation and management by telemedicine. The patient expressed understanding and agreed to proceed.  Vital Signs: Because this visit was a virtual/telehealth visit, some criteria may be missing or patient reported. Any vitals not documented were not able to be obtained and vitals that have been documented are patient reported.  VideoDeclined- This patient declined Librarian, academic. Therefore the visit was completed with audio only.  Persons Participating in Visit: Patient.  AWV Questionnaire: No: Patient Medicare AWV questionnaire was not completed prior to this visit.  Cardiac Risk Factors include: advanced age (>90men, >48 women);diabetes mellitus;hypertension;obesity (BMI >30kg/m2)     Objective:    Today's Vitals   06/07/24 1428  Weight: 210 lb (95.3 kg)  Height: 5' 2.5 (1.588 m)   Body mass index is 37.8 kg/m.     06/07/2024    2:28 PM 06/06/2023    1:15 PM 06/03/2022    4:13 PM 08/28/2020    9:17 AM 07/25/2020    9:26 AM 06/23/2020    6:45 AM 02/22/2020   12:07 AM  Advanced Directives  Does Patient Have a Medical Advance Directive? Yes Yes Yes Yes No No No  Type of Estate agent of [place];Living will Healthcare Power of [place];Living will       Does patient want to make changes to medical advance directive?   No  - Patient declined No - Patient declined     Copy of Healthcare Power of Attorney in Chart? No - copy requested No - copy requested       Would patient like information on creating a medical advance directive?     No - Patient declined No - Patient declined No - Patient declined    Current Medications (verified) Outpatient Encounter Medications as of 06/07/2024  Medication Sig   acetaminophen  (TYLENOL ) 325 MG tablet Take 650 mg by mouth every 6 (six) hours as needed (every 6 weeks before RA infusion).   alendronate  (FOSAMAX ) 70 MG tablet Take 1 tablet (70 mg total) by mouth every 7 (seven) days. Take with a full glass of water on an empty stomach.   allopurinol  (ZYLOPRIM ) 100 MG tablet Take 1 tablet (100 mg total) by mouth daily. Ov needed   amLODipine  (NORVASC ) 2.5 MG tablet Take 2.5 mg by mouth daily in the afternoon.   bacitracin 500 UNIT/GM ointment    calcitRIOL  (ROCALTROL ) 0.25 MCG capsule Take 1 capsule (0.25 mcg total) by mouth daily.   cholecalciferol (VITAMIN D3) 25 MCG (1000 UNIT) tablet Take 1,000 Units by mouth daily.   clotrimazole -betamethasone  (LOTRISONE ) cream Apply 1 Application topically 2 (two) times daily.   Continuous Blood Gluc Receiver (FREESTYLE LIBRE 3 READER) DEVI 1 Device by Does not apply route daily.   Continuous Blood Gluc Sensor (FREESTYLE LIBRE 3 SENSOR) MISC Place 1 sensor on the skin every 14 days. Use to check glucose continuously E11.9   diclofenac  Sodium (VOLTAREN )  1 % GEL APPLY AS DIRECTED FOUR TIMES DAILY AS NEEDED   diphenhydrAMINE (BENADRYL) 25 MG tablet Take 25 mg by mouth every 6 (six) hours as needed (every 6 weeks prior to RA infusion).   Dulaglutide  (TRULICITY ) 0.75 MG/0.5ML SOAJ Inject 0.75 mg into the skin once a week.   escitalopram  (LEXAPRO ) 20 MG tablet Take by mouth.   estradiol (ESTRACE) 0.1 MG/GM vaginal cream Place vaginally.   Fingerstix Lancets MISC Use 1 each once daily Dx: DMII non-insulin  without complications.  E11.9   glucose blood  (PRECISION QID TEST) test strip 1 each (1 strip total) once daily Dx: E11.9   HYDROcodone -acetaminophen  (NORCO) 10-325 MG tablet Take 1 tablet by mouth every 6 (six) hours as needed.   hydroxychloroquine (PLAQUENIL) 200 MG tablet Take 200 mg by mouth 2 (two) times daily.   inFLIXimab  (REMICADE ) 100 MG injection    leflunomide  (ARAVA ) 20 MG tablet 1 tablet Orally Once a day for 30 day(s)   linaclotide  (LINZESS ) 145 MCG CAPS capsule Take 1 capsule (145 mcg total) by mouth daily before breakfast.   Multiple Vitamin (MULTIVITAMIN WITH MINERALS) TABS tablet Take 1 tablet by mouth daily.   mupirocin ointment (BACTROBAN) 2 % APPLY TO AFFECTED AREA 3 TIMES A DAY   neomycin -polymyxin-hydrocortisone (CORTISPORIN) OTIC solution Place 4 drops into the left ear 4 (four) times daily.   nystatin  cream (MYCOSTATIN ) Apply topically 2 (two) times daily.   omeprazole  (PRILOSEC) 20 MG capsule TAKE ONE (1) CAPSULE BY MOUTH TWICE DAILY BEFORE MEALS   oxybutynin  (DITROPAN  XL) 15 MG 24 hr tablet TAKE 1 TABLET BY MOUTH AT BEDTIME   repaglinide  (PRANDIN ) 1 MG tablet Take 1 tablet (1 mg total) by mouth 3 (three) times daily before meals.   rosuvastatin  (CRESTOR ) 20 MG tablet TAKE 1 TABLET BY MOUTH EVERY DAY   traZODone  (DESYREL ) 100 MG tablet TAKE 2 TABLETS BY MOUTH EVERY DAY AT BEDTIME   No facility-administered encounter medications on file as of 06/07/2024.    Allergies (verified) Codeine, Iodinated contrast media, Nitrofurantoin monohyd macro, Betadine [povidone iodine], Folic acid, Gabapentin , Iodine, Lyrica  [pregabalin ], Red dye #40 (allura red), and Ultram [tramadol hcl]   History: Past Medical History:  Diagnosis Date   Allergy    generic allergy pill; Spring and Fall only.   Anxiety    Arthritis    DDD lumbar, R hip OA.  s/p ortho consult in past.   Blood transfusion without reported diagnosis    Mountain climbing accident in [place].   Brachial plexus disorders    Cataract    B retractions.   Chronic  kidney disease    stage 3 per pt.    Chronic pain syndrome    Chronic renal insufficiency, stage 3 (moderate) (HCC)    Constipation    DDD (degenerative disc disease), lumbar    Depression    Diabetes mellitus    Diabetic peripheral neuropathy associated with type 2 diabetes mellitus (HCC)    Diabetic retinopathy (HCC)    Diabetic retinopathy associated with type 2 diabetes mellitus (HCC)    s/p laser treatment multiple.  Unable to drive.   Fatty liver    Fibromyalgia    Food allergy    GERD (gastroesophageal reflux disease)    Hypercholesteremia    Hyperlipidemia    Hypertension    controlled, off meds    IBS (irritable bowel syndrome)    Leg edema    Neuromuscular disorder (HCC)    OSA (obstructive sleep apnea)    Osteoarthritis  Rheumatic fever    Rheumatoid arthritis (HCC)    Stomach ulcer    Swallowing difficulty    TIA (transient ischemic attack)    Ulcer    Peptic ulcer H. Pylori + s/p treatment.  Upper GI diagnosed.SABRA Mask Prilosec PRN .   Past Surgical History:  Procedure Laterality Date    2 SPINAL INJECTIONS      ABDOMINAL HYSTERECTOMY  11/02/1979   DUB; cervical dysplasia; ovaries intact.   ABDOMINAL SURGERY     staph abcess    Behavioral [facility] Admission     age 3; three months in [place].   BIOPSY  07/26/2020   Procedure: BIOPSY;  Surgeon: Burnette Fallow, MD;  Location: [facility];  Service: Endoscopy;;   BREAST BIOPSY     CARDIAC CATHETERIZATION  11/02/2007   normal coronary arteries.   CARPAL TUNNEL RELEASE     Bilateral.   CATARACT EXTRACTION, BILATERAL     CHOLECYSTECTOMY     ESOPHAGEAL MANOMETRY N/A 09/14/2017   Procedure: ESOPHAGEAL MANOMETRY (EM);  Surgeon: Saintclair Jasper, MD;  Location: [facility];  Service: Gastroenterology;  Laterality: N/A;   ESOPHAGOGASTRODUODENOSCOPY (EGD) WITH PROPOFOL  N/A 07/26/2020   Procedure: ESOPHAGOGASTRODUODENOSCOPY (EGD) WITH PROPOFOL ;  Surgeon: Burnette Fallow, MD;  Location: [facility];  Service:  Endoscopy;  Laterality: N/A;   EYE SURGERY     Cataracts B. Laser surgery x 7 for Diabetic Retinopathy   TONSILLECTOMY     Family History  Adopted: Yes  Problem Relation Age of Onset   Breast cancer Daughter    Social History   Socioeconomic History   Marital status: Married    Spouse name: Quarry manager   Number of children: 2   Years of education: college   Highest education level: Not on file  Occupational History   Occupation: retired    Comment: retretied  Tobacco Use   Smoking status: Former    Types: Cigarettes   Smokeless tobacco: Never   Tobacco comments:    Quit 1987  Vaping Use   Vaping status: Never Used  Substance and Sexual Activity   Alcohol use: No    Alcohol/week: 0.0 standard drinks of alcohol   Drug use: No   Sexual activity: Yes    Birth control/protection: Surgical, Post-menopausal    Comment: widow  Other Topics Concern   Not on file  Social History Narrative   Marital status: widowed since 2009; dating x 6 years.  Happy; no abuse.      Children: 2 children (35 daughter, 17 son estranged); 2 grandchildren.      Lives: with boyfriend, daughter, granddaughter, friend of daughter.  Lives in pt house.      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      Tobacco:  Smoked x 20 years; quit 20 years.      Alcohol:  On special occasions; once per week on average.       Drugs:  None since college.      Exercise:  Walking several times per week; walks the dog.   Education college   Caffeine one cup daily.   Right handed      Advanced Directives: none; FULL CODE.  DNR/DNI.  HCPOA: [place]?           Social Drivers of Corporate investment banker Strain: Low Risk  (06/07/2024)   Overall Financial Resource Strain (CARDIA)    Difficulty of Paying Living Expenses: Not hard  at all  Food Insecurity: No Food Insecurity (06/07/2024)   Hunger Vital Sign    Worried About Running Out of Food in the Last  Year: Never true    Ran Out of Food in the Last Year: Never true  Transportation Needs: No Transportation Needs (06/07/2024)   PRAPARE - Administrator, Civil Service (Medical): No    Lack of Transportation (Non-Medical): No  Physical Activity: Insufficiently Active (06/07/2024)   Exercise Vital Sign    Days of Exercise per Week: 7 days    Minutes of Exercise per Session: 20 min  Stress: No Stress Concern Present (06/07/2024)   [employer] of Occupational Health - Occupational Stress Questionnaire    Feeling of Stress: Not at all  Social Connections: Socially Integrated (06/07/2024)   Social Connection and Isolation Panel    Frequency of Communication with Friends and Family: More than three times a week    Frequency of Social Gatherings with Friends and Family: Once a week    Attends Religious Services: More than 4 times per year    Active Member of [employer] or Organizations: Yes    Attends Banker Meetings: 1 to 4 times per year    Marital Status: Married    Tobacco Counseling Counseling given: No Tobacco comments: Quit 1987    Clinical Intake:  Pre-visit preparation completed: Yes  Pain : No/denies pain     BMI - recorded: 37.8 Nutritional Risks: None Diabetes: Yes CBG done?: Yes CBG resulted in Enter/ Edit results?: Yes (fasting - 100) Did pt. bring in CBG monitor from home?: No  Lab Results  Component Value Date   HGBA1C 5.1 01/17/2024   HGBA1C 5.4 12/07/2023   HGBA1C 5.0 07/20/2023     How often do you need to have someone help you when you read instructions, pamphlets, or other written materials from your doctor or pharmacy?: 1 - Never  Interpreter Needed?: No  Information entered by :: Verdie Saba, CMA   Activities of Daily Living     06/07/2024    2:33 PM  In your present state of health, do you have any difficulty performing the following activities:  Hearing? 0  Vision? 0  Difficulty concentrating or making decisions? 0   Walking or climbing stairs? 1  Comment uses a walker  Dressing or bathing? 0  Doing errands, shopping? 0  Preparing Food and eating ? N  Using the Toilet? N  In the past six months, have you accidently leaked urine? Y  Comment wears a pad  Do you have problems with loss of bowel control? N  Managing your Medications? N  Managing your Finances? N  Housekeeping or managing your Housekeeping? N    Patient Care Team: Norleen Lynwood ORN, MD as PCP - General (Internal Medicine) O'Neal, Darryle Ned, MD as PCP - Cardiology (Cardiology) Loreda Hacker, DPM as Consulting Physician (Podiatry) Patrcia Sharper, MD as Consulting Physician (Ophthalmology)  I have updated your Care Teams any recent Medical Services you may have received from other providers in the past year.     Assessment:   This is a routine wellness examination for Angel French.  Hearing/Vision screen Hearing Screening - Comments:: Denies hearing difficulties   Vision Screening - Comments:: Wears rx glasses - up to date with routine eye exams with Dr Patrcia   Goals Addressed               This Visit's Progress     Patient Stated (pt-stated)  Patient stated she has been wanting to lose weight - about 50lbs       Depression Screen     06/07/2024    2:34 PM 01/17/2024    1:27 PM 07/20/2023    1:41 PM 06/06/2023    1:21 PM 04/14/2023    1:27 PM 01/12/2023    2:25 PM 10/13/2022    2:01 PM  PHQ 2/9 Scores  PHQ - 2 Score 0 0 0 0 0 0 0  PHQ- 9 Score 0  0 0   0    Fall Risk     06/07/2024    2:33 PM 01/17/2024    1:37 PM 07/20/2023    1:41 PM 06/06/2023    1:15 PM 04/14/2023    1:26 PM  Fall Risk   Falls in the past year? 1 0 0 1 1  Number falls in past yr: 1 0 0 1 0  Comment 4      Injury with Fall? 0 0 0 1 0  Comment     torn meninges  Risk for fall due to : Impaired balance/gait;History of fall(s) No Fall Risks No Fall Risks History of fall(s);Impaired balance/gait;Orthopedic patient Impaired balance/gait;Other  (Comment)  Follow up Falls evaluation completed;Falls prevention discussed Falls evaluation completed Falls evaluation completed Education provided;Falls prevention discussed;Falls evaluation completed Falls evaluation completed    MEDICARE RISK AT HOME:  Medicare Risk at Home Any stairs in or around the home?: No If so, are there any without handrails?: No Home free of loose throw rugs in walkways, pet beds, electrical cords, etc?: Yes Adequate lighting in your home to reduce risk of falls?: Yes Life alert?: No Use of a cane, walker or w/c?: Yes (cane) Grab bars in the bathroom?: Yes Shower chair or bench in shower?: Yes Elevated toilet seat or a handicapped toilet?: Yes  TIMED UP AND GO:  Was the test performed?  No  Cognitive Function: 6CIT completed        06/07/2024    2:36 PM 06/06/2023    1:17 PM  6CIT Screen  What Year? 0 points 0 points  What month? 0 points 0 points  What time? 0 points 0 points  Count back from 20 0 points 0 points  Months in reverse 0 points 0 points  Repeat phrase 0 points 0 points  Total Score 0 points 0 points    Immunizations Immunization History  Administered Date(s) Administered   Fluad Quad(high Dose 65+) 07/08/2021, 07/08/2022   Hepatitis A, Adult 10/06/2015, 08/12/2016   Influenza, High Dose Seasonal PF 09/15/2018, 07/12/2023   Influenza,inj,Quad PF,6+ Mos 07/16/2013, 07/10/2014, 10/15/2015, 08/12/2016, 06/28/2017   Influenza-Unspecified 07/16/2013, 07/10/2014, 10/15/2015, 08/12/2016, 06/28/2017   Moderna Covid-19 Fall Seasonal Vaccine 42yrs & older 10/08/2022, 04/08/2023, 04/28/2023, 10/11/2023, 04/12/2024   PFIZER(Purple Top)SARS-COV-2 Vaccination 01/10/2020, 02/06/2020, 08/07/2020, 03/05/2021   Pfizer Covid-19 Vaccine Bivalent Booster 51yrs & up 10/13/2021, 05/07/2022   Pneumococcal Conjugate-13 03/23/2017   Pneumococcal Polysaccharide-23 07/10/2014, 10/19/2019, 12/31/2021   Respiratory Syncytial Virus Vaccine,Recomb  Aduvanted(Arexvy) 10/14/2022   Td 01/12/2022   Tdap 10/06/2015   Zoster Recombinant(Shingrix) 02/24/2021, 08/04/2021    Screening Tests Health Maintenance  Topic Date Due   INFLUENZA VACCINE  06/01/2024   MAMMOGRAM  06/22/2024   HEMOGLOBIN A1C  07/19/2024   FOOT EXAM  01/02/2025   Diabetic kidney evaluation - Urine ACR  01/16/2025   Diabetic kidney evaluation - eGFR measurement  02/08/2025   OPHTHALMOLOGY EXAM  05/22/2025   Medicare Annual Wellness (AWV)  06/07/2025  DTaP/Tdap/Td (3 - Td or Tdap) 01/13/2032   Pneumococcal Vaccine: 50+ Years  Completed   DEXA SCAN  Completed   COVID-19 Vaccine  Completed   Hepatitis C Screening  Completed   Zoster Vaccines- Shingrix  Completed   Hepatitis B Vaccines  Aged Out   HPV VACCINES  Aged Out   Meningococcal B Vaccine  Aged Out    Health Maintenance  Health Maintenance Due  Topic Date Due   INFLUENZA VACCINE  06/01/2024   Health Maintenance Items Addressed: 06/07/2024   Additional Screening:  Vision Screening: Recommended annual ophthalmology exams for early detection of glaucoma and other disorders of the eye. Would you like a referral to an eye doctor? No    Dental Screening: Recommended annual dental exams for proper oral hygiene  Community Resource Referral / Chronic Care Management: CRR required this visit?  No   CCM required this visit?  No   Plan:    I have personally reviewed and noted the following in the patient's chart:   Medical and social history Use of alcohol, tobacco or illicit drugs  Current medications and supplements including opioid prescriptions. Patient is not currently taking opioid prescriptions. Functional ability and status Nutritional status Physical activity Advanced directives List of other physicians Hospitalizations, surgeries, and ER visits in previous 12 months Vitals Screenings to include cognitive, depression, and falls Referrals and appointments  In addition, I have reviewed  and discussed with patient certain preventive protocols, quality metrics, and best practice recommendations. A written personalized care plan for preventive services as well as general preventive health recommendations were provided to patient.   Verdie CHRISTELLA Saba, CMA   06/07/2024   After Visit Summary: (MyChart) Due to this being a telephonic visit, the after visit summary with patients personalized plan was offered to patient via MyChart   Notes: Nothing significant to report at this time.

## 2024-06-11 ENCOUNTER — Encounter: Payer: Self-pay | Admitting: Family Medicine

## 2024-06-11 ENCOUNTER — Ambulatory Visit (INDEPENDENT_AMBULATORY_CARE_PROVIDER_SITE_OTHER): Admitting: Family Medicine

## 2024-06-11 VITALS — BP 124/78 | HR 72 | Temp 97.8°F | Ht 62.5 in | Wt 210.0 lb

## 2024-06-11 DIAGNOSIS — Z79899 Other long term (current) drug therapy: Secondary | ICD-10-CM

## 2024-06-11 DIAGNOSIS — Z794 Long term (current) use of insulin: Secondary | ICD-10-CM

## 2024-06-11 DIAGNOSIS — R011 Cardiac murmur, unspecified: Secondary | ICD-10-CM

## 2024-06-11 DIAGNOSIS — R300 Dysuria: Secondary | ICD-10-CM | POA: Diagnosis not present

## 2024-06-11 DIAGNOSIS — D84821 Immunodeficiency due to drugs: Secondary | ICD-10-CM | POA: Diagnosis not present

## 2024-06-11 DIAGNOSIS — M05741 Rheumatoid arthritis with rheumatoid factor of right hand without organ or systems involvement: Secondary | ICD-10-CM

## 2024-06-11 DIAGNOSIS — E1142 Type 2 diabetes mellitus with diabetic polyneuropathy: Secondary | ICD-10-CM

## 2024-06-11 DIAGNOSIS — N1832 Chronic kidney disease, stage 3b: Secondary | ICD-10-CM

## 2024-06-11 DIAGNOSIS — M05742 Rheumatoid arthritis with rheumatoid factor of left hand without organ or systems involvement: Secondary | ICD-10-CM

## 2024-06-11 LAB — COMPREHENSIVE METABOLIC PANEL WITH GFR
ALT: 20 U/L (ref 0–35)
AST: 24 U/L (ref 0–37)
Albumin: 4.1 g/dL (ref 3.5–5.2)
Alkaline Phosphatase: 63 U/L (ref 39–117)
BUN: 24 mg/dL — ABNORMAL HIGH (ref 6–23)
CO2: 27 meq/L (ref 19–32)
Calcium: 8.8 mg/dL (ref 8.4–10.5)
Chloride: 104 meq/L (ref 96–112)
Creatinine, Ser: 1.44 mg/dL — ABNORMAL HIGH (ref 0.40–1.20)
GFR: 36.35 mL/min — ABNORMAL LOW (ref >60.00)
Glucose, Bld: 77 mg/dL (ref 70–99)
Potassium: 4.6 meq/L (ref 3.5–5.1)
Sodium: 138 meq/L (ref 135–145)
Total Bilirubin: 0.6 mg/dL (ref 0.2–1.2)
Total Protein: 7 g/dL (ref 6.0–8.3)

## 2024-06-11 LAB — POCT URINALYSIS DIP (CLINITEK)
Blood, UA: NEGATIVE
Glucose, UA: NEGATIVE mg/dL
Ketones, POC UA: NEGATIVE mg/dL
Nitrite, UA: NEGATIVE
POC PROTEIN,UA: 30 — AB
Spec Grav, UA: >=1.03 — AB (ref 1.010–1.025)
Urobilinogen, UA: 0.2 U/dL
pH, UA: 5.5 (ref 5.0–8.0)

## 2024-06-11 LAB — CBC WITH DIFFERENTIAL/PLATELET
Basophils Absolute: 0 K/uL (ref 0.0–0.1)
Basophils Relative: 0.8 % (ref 0.0–3.0)
Eosinophils Absolute: 0.2 K/uL (ref 0.0–0.7)
Eosinophils Relative: 3.7 % (ref 0.0–5.0)
HCT: 33 % — ABNORMAL LOW (ref 36.0–46.0)
Hemoglobin: 11.1 g/dL — ABNORMAL LOW (ref 12.0–15.0)
Lymphocytes Relative: 31.6 % (ref 12.0–46.0)
Lymphs Abs: 1.6 K/uL (ref 0.7–4.0)
MCHC: 33.7 g/dL (ref 30.0–36.0)
MCV: 94.3 fl (ref 78.0–100.0)
Monocytes Absolute: 0.5 K/uL (ref 0.1–1.0)
Monocytes Relative: 9.4 % (ref 3.0–12.0)
Neutro Abs: 2.8 K/uL (ref 1.4–7.7)
Neutrophils Relative %: 54.5 % (ref 43.0–77.0)
Platelets: 173 K/uL (ref 150.0–400.0)
RBC: 3.5 Mil/uL — ABNORMAL LOW (ref 3.87–5.11)
RDW: 14.2 % (ref 11.5–15.5)
WBC: 5.1 K/uL (ref 4.0–10.5)

## 2024-06-11 MED ORDER — CEPHALEXIN 500 MG PO CAPS: 500.0000 mg | ORAL_CAPSULE | Freq: Two times a day (BID) | ORAL | 0 refills | Status: AC

## 2024-06-11 NOTE — Progress Notes (Signed)
 Acute Office Visit  Subjective:     Patient ID: Spencer K Cornacchia, female    DOB: 08-15-1951, 73 y.o.   MRN: [id number]  Chief Complaint  Patient presents with   Acute Visit    HPI  Discussed the use of AI scribe software for clinical note transcription with the patient, who gave verbal consent to proceed.  History of Present Illness Kenzlee K Aure is a 73 year old female with stage 3B kidney disease who presents with symptoms suggestive of a urinary tract infection.  Urinary symptoms - Urinary symptoms present for over one week - Light-colored urine attributed to increased fluid intake  Constitutional symptoms - Unusual fatigue - Chills without documented fever  Glycemic control - Slightly elevated blood glucose levels - Recent postprandial blood sugar of 145 mg/dL  Peripheral edema and dyspnea - Intermittent foot swelling without pain - Occasional shortness of breath associated with leg pain after walking  Antibiotic allergies and preferences - Allergic to Macrobid - Prefers Keflex , which has been well tolerated in the past     ROS Per HPI      Objective:    BP 124/78 (BP Location: Left Arm, Patient Position: Sitting)   Pulse 72   Temp 97.8 F (36.6 C) (Temporal)   Ht 5' 2.5 (1.588 m)   Wt 210 lb (95.3 kg)   SpO2 96%   BMI 37.80 kg/m    Physical Exam Vitals and nursing note reviewed.  Constitutional:      General: She is not in acute distress.    Comments: Chronically ill appearing, appears fatigued  HENT:     Head: Normocephalic and atraumatic.     Right Ear: External ear normal.     Left Ear: External ear normal.     Nose: Nose normal.     Mouth/Throat:     Mouth: Mucous membranes are moist.     Pharynx: Oropharynx is clear.  Eyes:     Extraocular Movements: Extraocular movements intact.     Pupils: Pupils are equal, round, and reactive to light.  Cardiovascular:     Rate and Rhythm: Normal rate and regular rhythm.     Pulses: Normal  pulses.     Heart sounds: Murmur heard.  Pulmonary:     Effort: Pulmonary effort is normal. No respiratory distress.     Breath sounds: Normal breath sounds. No wheezing, rhonchi or rales.  Musculoskeletal:     Cervical back: Normal range of motion.     Right lower leg: No edema.     Left lower leg: No edema.     Comments: Using rollator walker  Lymphadenopathy:     Cervical: No cervical adenopathy.  Neurological:     General: No focal deficit present.     Mental Status: She is alert and oriented to person, place, and time.  Psychiatric:        Mood and Affect: Mood normal.        Thought Content: Thought content normal.     Results for orders placed or performed in visit on 06/11/24  POCT URINALYSIS DIP (CLINITEK)  Result Value Ref Range   Color, UA yellow yellow   Clarity, UA cloudy (A) clear   Glucose, UA negative negative mg/dL   Bilirubin, UA small (A) negative   Ketones, POC UA negative negative mg/dL   Spec Grav, UA >=8.969 (A) 1.010 - 1.025   Blood, UA negative negative   pH, UA 5.5 5.0 - 8.0   POC  PROTEIN,UA =30 (A) negative, trace   Urobilinogen, UA 0.2 0.2 or 1.0 E.U./dL   Nitrite, UA Negative Negative   Leukocytes, UA Moderate (2+) (A) Negative        Assessment & Plan:   Assessment and Plan Assessment & Plan Urinary tract infection Symptoms consistent with UTI for over a week. Bilirubin in urine is unusual. Allergic to Macrobid, but tolerated Keflex  well. Reports chills, no fever. Fatigue may be UTI-related. - Higher risk r/t immunocompromise due to medication use (Remicade , plaquenil, leflunomide )  - Prescribe Keflex  for UTI treatment. - Order urine culture to confirm diagnosis and guide treatment. - Order blood work to check kidney and liver function.  Chronic kidney disease stage 3b Stage 3b CKD with no new symptoms. - Monitor kidney function through blood work.  Fatigue Significant fatigue, possibly UTI-related.  Hyperglycemia Slightly  elevated blood sugar, recent reading 145 mg/dL postprandial, possibly infection-related.  Mitral annular calcification Mitral annular calcification with no significant changes. Previous echocardiogram showed normal heart function with mild calcification. Mild calcification expected with age.     Orders Placed This Encounter  Procedures   Urine Culture   Urine Culture    Standing Status:   Future    Number of Occurrences:   1    Expiration Date:   07/12/2024   CBC with Differential/Platelet    Release to patient:   Immediate [1]   Comprehensive metabolic panel with GFR    Release to patient:   Immediate [1]   POCT URINALYSIS DIP (CLINITEK)     Meds ordered this encounter  Medications   cephALEXin  (KEFLEX ) 500 MG capsule    Sig: Take 1 capsule (500 mg total) by mouth 2 (two) times daily for 5 days.    Dispense:  10 capsule    Refill:  0    Return if symptoms worsen or fail to improve.  Corean LITTIE Ku, FNP

## 2024-06-11 NOTE — Patient Instructions (Addendum)
 Your urine was concerning for infection in the office today. We have sent it to the lab for culture and confirmation.  I have sent in keflex  for you to take twice a day for 5 days. Please eat when you take this medication, it can upset your stomach if you do not. Please be sure to complete the course of antibiotics even if you are feeling better.   We are checking labs today, will be in contact with any results that require further attention  Follow-up with me for new or worsening symptoms.

## 2024-06-12 ENCOUNTER — Ambulatory Visit: Payer: Self-pay | Admitting: Family Medicine

## 2024-06-12 NOTE — Telephone Encounter (Signed)
 Patient called back in, she thought [facility] had called with her urine culture results. Read provider's noted and instructed patient urine culture results are not released yet: White blood cell count was normal with mild, but improved anemia.  Kidney function is stable, liver function is normal. Patient verbalizes understanding.

## 2024-06-12 NOTE — Telephone Encounter (Signed)
 Note added to Results Follow Up encounter as patient was calling back regarding her results. Closing encounter.   Copied from CRM #[id number]. Topic: Clinical - Lab/Test Results >> Jun 12, 2024  3:10 PM Angel French wrote: Reason for CRM: called to get explanation of test results/urineThis encounter was created in error - please disregard.

## 2024-06-14 LAB — URINE CULTURE

## 2024-06-19 ENCOUNTER — Encounter: Payer: Self-pay | Admitting: "Endocrinology

## 2024-06-19 ENCOUNTER — Ambulatory Visit (INDEPENDENT_AMBULATORY_CARE_PROVIDER_SITE_OTHER): Admitting: "Endocrinology

## 2024-06-19 VITALS — BP 130/80 | HR 84 | Ht 62.5 in | Wt 201.0 lb

## 2024-06-19 DIAGNOSIS — E1142 Type 2 diabetes mellitus with diabetic polyneuropathy: Secondary | ICD-10-CM

## 2024-06-19 DIAGNOSIS — M85852 Other specified disorders of bone density and structure, left thigh: Secondary | ICD-10-CM | POA: Diagnosis not present

## 2024-06-19 DIAGNOSIS — E213 Hyperparathyroidism, unspecified: Secondary | ICD-10-CM

## 2024-06-19 NOTE — Progress Notes (Signed)
 Outpatient Endocrinology Note [name], MD    Angel French 10/29/1951 [id number]  Referring Provider: Norleen Lynwood ORN, MD Primary Care Provider: Norleen Lynwood ORN, MD Reason for consultation: Subjective   Assessment & Plan  Diagnoses and all orders for this visit:  Hyperparathyroidism (HCC) -     VITAMIN D  25 Hydroxy (Vit-D Deficiency, Fractures) -     Vitamin D  1,25 dihydroxy -     Cancel: Renal function panel -     PTH, intact and calcium  -     Magnesium  -     PTH, Intact and Calcium  -     Renal function panel  Unspecified disorder of calcium  metabolism -     Vitamin D  1,25 dihydroxy  Osteopenia of neck of left femur  Type 2 diabetes mellitus with diabetic polyneuropathy, without long-term current use of insulin  (HCC) -     Hemoglobin A1c    Patient had a high PTH of 129 in 07/2023, which last improved to 85.  25 vitamin D  was toxic high at 1 point but now has been normal to 55. 1, 25 vitamin D  was on low normal side at 18, with history of CKD with creatinine of 1.35 and phosphorus elevated at 4.5.  Calcium  was within normal limits. Also has history of osteopenia with T-score of -2.4 at femoral left neck in 11/2023 DEXA scan. High phosphorus could be due to CKD which could be due to diabetes or other reasons On Fosamax  70 mg weekly, Vit D 1000 units a day, no calcium , Calcitrol 0.25 mcg p.o. daily  Start calcium  600 mg bid   Patient is incontinent, wears 2 pads/day  Recommend to continue trending PTH with repeat labs in 3 months. If continued PTH elevation, will give a trial of calcitriol   Patient has a history of diabetes, current A1c at 5.1% Checks FBS, runs in 80s Patient was on insulin  at one point but currently take repaglinide  1 mg 3 times a day Seems like Trulicity /Mounjaro /libre were ordered but may not be covered by insurance On rosuvastatin  20 gm qd, MA/Cr WNL No history of MEN syndrome/medullary thyroid  cancer/pancreatitis or pancreatic cancer in self  or family Will follow-up on next visit  Return in about 6 months (around 12/20/2024) for visit, labs today, labs before next visit.   I have reviewed current medications, nurse's notes, allergies, vital signs, past medical and surgical history, family medical history, and social history for this encounter. Counseled patient on symptoms, examination findings, lab findings, imaging results, treatment decisions and monitoring and prognosis. The patient understood the recommendations and agrees with the treatment plan. All questions regarding treatment plan were fully answered.  [name], MD  06/19/24   History of Present Illness HPI   Angel French is a 73 y.o. female referred by Dr. Norleen for evaluation and management of hyperparathyroidism.  Patient also interested in discussing her diabetes care.  Reports falls all the time, no fracture  No abdominal pain/nausea/vomiting/GERD Always constipated Checks BG fasting, ranging in 81-145 On prandin  1 mg tid, rosuvastatin  20 mg every day Couldn't afford Trulicity   Initial history:   Patient had a high PTH of 129 in 07/2023, which is now improved to 85.  25 vitamin D  was toxic high at 1 point but now has been normal to 55.  125 vitamin D  is on low normal side at 18, with history of CKD with creatinine of 1.35 and phosphorus elevated at 4.5.  Patient is not taking any kind of  calcium /vitamin D /multivitamin.  Also has history of osteopenia with T-score of -2.4 at femoral left neck in 11/2023 DEXA scan.  Patient denies a history of kidney stones  She  current hematuria No polyuria Yes, has urinary incontinence  nocturia Yes thirst No renal failure Yes, has CKD  anorexia No abdominal pain No heartburn Yes, has prilosec  constipation Yes, has IBS nausea or vomiting No history of peptic ulcer disease Yes depression Yes, on lexapro   confusion No excessive fatigue No fracture Yes osteoporosis No, hjas osteopenia  headaches  No numbness No tingling No  She takes Calcium  No She takes Vitamin D  supplements No  She a history of taking chronic lithium No She a recent history of thiazide diuretic intake No  She family history of renal stones/hypercalcemia No a personal history of MEN syndromes/medullary thyroid  cancer/ pheochromocytoma No  Physical Exam  BP 130/80   Pulse 84   Ht 5' 2.5 (1.588 m)   Wt 201 lb (91.2 kg)   SpO2 96%   BMI 36.18 kg/m    Constitutional: well developed, well nourished Head: normocephalic, atraumatic Eyes: sclera anicteric, no redness Neck: supple Lungs: normal respiratory effort Neurology: alert and oriented Skin: dry, no appreciable rashes Musculoskeletal: no appreciable defects Psychiatric: normal mood and affect   Current Medications Patient's Medications  New Prescriptions   No medications on file  Previous Medications   ACETAMINOPHEN  (TYLENOL ) 325 MG TABLET    Take 650 mg by mouth every 6 (six) hours as needed (every 6 weeks before RA infusion).   ALENDRONATE  (FOSAMAX ) 70 MG TABLET    Take 1 tablet (70 mg total) by mouth every 7 (seven) days. Take with a full glass of water on an empty stomach.   ALLOPURINOL  (ZYLOPRIM ) 100 MG TABLET    Take 1 tablet (100 mg total) by mouth daily. Ov needed   AMLODIPINE  (NORVASC ) 2.5 MG TABLET    Take 2.5 mg by mouth daily in the afternoon.   BACITRACIN 500 UNIT/GM OINTMENT       CALCITRIOL  (ROCALTROL ) 0.25 MCG CAPSULE    Take 1 capsule (0.25 mcg total) by mouth daily.   CEPHALEXIN  (KEFLEX ) 500 MG CAPSULE    Take 1 capsule (500 mg total) by mouth 2 (two) times daily for 5 days.   CHOLECALCIFEROL (VITAMIN D3) 25 MCG (1000 UNIT) TABLET    Take 1,000 Units by mouth daily.   CLOTRIMAZOLE -BETAMETHASONE  (LOTRISONE ) CREAM    Apply 1 Application topically 2 (two) times daily.   CONTINUOUS BLOOD GLUC RECEIVER (FREESTYLE LIBRE 3 READER) DEVI    1 Device by Does not apply route daily.   CONTINUOUS BLOOD GLUC SENSOR (FREESTYLE LIBRE 3 SENSOR)  MISC    Place 1 sensor on the skin every 14 days. Use to check glucose continuously E11.9   DICLOFENAC  SODIUM (VOLTAREN ) 1 % GEL    APPLY AS DIRECTED FOUR TIMES DAILY AS NEEDED   DIPHENHYDRAMINE (BENADRYL) 25 MG TABLET    Take 25 mg by mouth every 6 (six) hours as needed (every 6 weeks prior to RA infusion).   DULAGLUTIDE  (TRULICITY ) 0.75 MG/0.5ML SOAJ    Inject 0.75 mg into the skin once a week.   ESCITALOPRAM  (LEXAPRO ) 20 MG TABLET    Take by mouth.   ESTRADIOL (ESTRACE) 0.1 MG/GM VAGINAL CREAM    Place vaginally.   FINGERSTIX LANCETS MISC    Use 1 each once daily Dx: DMII non-insulin  without complications.  E11.9   GLUCOSE BLOOD (PRECISION QID TEST) TEST STRIP  1 each (1 strip total) once daily Dx: E11.9   HYDROCODONE -ACETAMINOPHEN  (NORCO) 10-325 MG TABLET    Take 1 tablet by mouth every 6 (six) hours as needed.   HYDROXYCHLOROQUINE (PLAQUENIL) 200 MG TABLET    Take 200 mg by mouth 2 (two) times daily.   INFLIXIMAB  (REMICADE ) 100 MG INJECTION       LEFLUNOMIDE  (ARAVA ) 20 MG TABLET    1 tablet Orally Once a day for 30 day(s)   LINACLOTIDE  (LINZESS ) 145 MCG CAPS CAPSULE    Take 1 capsule (145 mcg total) by mouth daily before breakfast.   MULTIPLE VITAMIN (MULTIVITAMIN WITH MINERALS) TABS TABLET    Take 1 tablet by mouth daily.   MUPIROCIN OINTMENT (BACTROBAN) 2 %    APPLY TO AFFECTED AREA 3 TIMES A DAY   NEOMYCIN -POLYMYXIN-HYDROCORTISONE (CORTISPORIN) OTIC SOLUTION    Place 4 drops into the left ear 4 (four) times daily.   NYSTATIN  CREAM (MYCOSTATIN )    Apply topically 2 (two) times daily.   OMEPRAZOLE  (PRILOSEC) 20 MG CAPSULE    TAKE ONE (1) CAPSULE BY MOUTH TWICE DAILY BEFORE MEALS   OXYBUTYNIN  (DITROPAN  XL) 15 MG 24 HR TABLET    TAKE 1 TABLET BY MOUTH AT BEDTIME   REPAGLINIDE  (PRANDIN ) 1 MG TABLET    Take 1 tablet (1 mg total) by mouth 3 (three) times daily before meals.   ROSUVASTATIN  (CRESTOR ) 20 MG TABLET    TAKE 1 TABLET BY MOUTH EVERY DAY   TRAZODONE  (DESYREL ) 100 MG TABLET    TAKE 2  TABLETS BY MOUTH EVERY DAY AT BEDTIME  Modified Medications   No medications on file  Discontinued Medications   No medications on file    Allergies Allergies  Allergen Reactions   Codeine Anaphylaxis   Iodinated Contrast Media Anaphylaxis    Other reaction(s): respiratory distress   Nitrofurantoin Monohyd Macro Anaphylaxis   Betadine [Povidone Iodine] Itching   Folic Acid Itching   Gabapentin  Other (See Comments)    Makes patient feel drunk   Iodine Hives   Lyrica  [Pregabalin ] Other (See Comments)    Makes patient feel drunk   Red Dye #40 (Allura Red) Itching    Other reaction(s): Unknown   Ultram [Tramadol Hcl] Nausea And Vomiting    Past Medical History Past Medical History:  Diagnosis Date   Allergy    generic allergy pill; Spring and Fall only.   Anxiety    Arthritis    DDD lumbar, R hip OA.  s/p ortho consult in past.   Blood transfusion without reported diagnosis    Mountain climbing accident in [place].   Brachial plexus disorders    Cataract    B retractions.   Chronic kidney disease    stage 3 per pt.    Chronic pain syndrome    Chronic renal insufficiency, stage 3 (moderate) (HCC)    Constipation    DDD (degenerative disc disease), lumbar    Depression    Diabetes mellitus    Diabetic peripheral neuropathy associated with type 2 diabetes mellitus (HCC)    Diabetic retinopathy (HCC)    Diabetic retinopathy associated with type 2 diabetes mellitus (HCC)    s/p laser treatment multiple.  Unable to drive.   Fatty liver    Fibromyalgia    Food allergy    GERD (gastroesophageal reflux disease)    Hypercholesteremia    Hyperlipidemia    Hypertension    controlled, off meds    IBS (irritable bowel syndrome)    Leg  edema    Neuromuscular disorder (HCC)    OSA (obstructive sleep apnea)    Osteoarthritis    Rheumatic fever    Rheumatoid arthritis (HCC)    Stomach ulcer    Swallowing difficulty    TIA (transient ischemic attack)    Ulcer    Peptic  ulcer H. Pylori + s/p treatment.  Upper GI diagnosed.SABRA Mask Prilosec PRN .    Past Surgical History Past Surgical History:  Procedure Laterality Date    2 SPINAL INJECTIONS      ABDOMINAL HYSTERECTOMY  11/02/1979   DUB; cervical dysplasia; ovaries intact.   ABDOMINAL SURGERY     staph abcess    [facility] Admission     age 60; three months in [place] [facility].   BIOPSY  07/26/2020   Procedure: BIOPSY;  Surgeon: Burnette Fallow, MD;  Location: [facility];  Service: Endoscopy;;   BREAST BIOPSY     CARDIAC CATHETERIZATION  11/02/2007   normal coronary arteries.   CARPAL TUNNEL RELEASE     Bilateral.   CATARACT EXTRACTION, BILATERAL     CHOLECYSTECTOMY     ESOPHAGEAL MANOMETRY N/A 09/14/2017   Procedure: ESOPHAGEAL MANOMETRY (EM);  Surgeon: Saintclair Jasper, MD;  Location: [facility];  Service: Gastroenterology;  Laterality: N/A;   ESOPHAGOGASTRODUODENOSCOPY (EGD) WITH PROPOFOL  N/A 07/26/2020   Procedure: ESOPHAGOGASTRODUODENOSCOPY (EGD) WITH PROPOFOL ;  Surgeon: Burnette Fallow, MD;  Location: [facility];  Service: Endoscopy;  Laterality: N/A;   EYE SURGERY     Cataracts B. Laser surgery x 7 for Diabetic Retinopathy   TONSILLECTOMY      Family History family history includes Breast cancer in her daughter. She was adopted.  Social History Social History   Socioeconomic History   Marital status: Married    Spouse name: Quarry manager   Number of children: 2   Years of education: college   Highest education level: Not on file  Occupational History   Occupation: retired    Comment: retretied  Tobacco Use   Smoking status: Former    Types: Cigarettes   Smokeless tobacco: Never   Tobacco comments:    Quit 1987  Vaping Use   Vaping status: Never Used  Substance and Sexual Activity   Alcohol use: No    Alcohol/week: 0.0 standard drinks of alcohol   Drug use: No   Sexual activity: Yes    Birth control/protection: Surgical, Post-menopausal    Comment: widow  Other Topics  Concern   Not on file  Social History Narrative   Marital status: widowed since 2009; dating x 6 years.  Happy; no abuse.      Children: 2 children (35 daughter, 65 son estranged); 2 grandchildren.      Lives: with boyfriend, daughter, granddaughter, friend of daughter.  Lives in pt house.      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      Tobacco:  Smoked x 20 years; quit 20 years.      Alcohol:  On special occasions; once per week on average.       Drugs:  None since college.      Exercise:  Walking several times per week; walks the dog.   Education college   Caffeine one cup daily.   Right handed      Advanced Directives: none; FULL CODE.  DNR/DNI.  HCPOA: [place]?           Social Drivers of Health  Financial Resource Strain: Low Risk  (06/07/2024)   Overall Financial Resource Strain (CARDIA)    Difficulty of Paying Living Expenses: Not hard at all  Food Insecurity: No Food Insecurity (06/07/2024)   Hunger Vital Sign    Worried About Running Out of Food in the Last Year: Never true    Ran Out of Food in the Last Year: Never true  Transportation Needs: No Transportation Needs (06/07/2024)   PRAPARE - Administrator, Civil Service (Medical): No    Lack of Transportation (Non-Medical): No  Physical Activity: Insufficiently Active (06/07/2024)   Exercise Vital Sign    Days of Exercise per Week: 7 days    Minutes of Exercise per Session: 20 min  Stress: No Stress Concern Present (06/07/2024)   [employer] of Occupational Health - Occupational Stress Questionnaire    Feeling of Stress: Not at all  Social Connections: Socially Integrated (06/07/2024)   Social Connection and Isolation Panel    Frequency of Communication with Friends and Family: More than three times a week    Frequency of Social Gatherings with Friends and Family: Once a week    Attends Religious Services: More than 4 times per year    Active  Member of Clubs or Organizations: Yes    Attends Banker Meetings: 1 to 4 times per year    Marital Status: Married  Catering manager Violence: Not At Risk (06/07/2024)   Humiliation, Afraid, Rape, and Kick questionnaire    Fear of Current or Ex-Partner: No    Emotionally Abused: No    Physically Abused: No    Sexually Abused: No    Lab Results  Component Value Date   CHOL 137 01/17/2024   Lab Results  Component Value Date   HDL 52.00 01/17/2024   Lab Results  Component Value Date   LDLCALC 67 01/17/2024   Lab Results  Component Value Date   TRIG 87.0 01/17/2024   Lab Results  Component Value Date   CHOLHDL 3 01/17/2024   Lab Results  Component Value Date   CREATININE 1.44 (H) 06/11/2024   Lab Results  Component Value Date   GFR 36.35 (L) 06/11/2024      Component Value Date/Time   NA 138 06/11/2024 1605   NA 143 01/02/2019 1258   K 4.6 06/11/2024 1605   CL 104 06/11/2024 1605   CO2 27 06/11/2024 1605   GLUCOSE 77 06/11/2024 1605   BUN 24 (H) 06/11/2024 1605   BUN 31 (H) 01/02/2019 1258   CREATININE 1.44 (H) 06/11/2024 1605   CREATININE 1.38 (H) 02/09/2024 1503   CALCIUM  8.8 06/11/2024 1605   PROT 7.0 06/11/2024 1605   PROT 7.6 01/02/2019 1258   ALBUMIN 4.1 06/11/2024 1605   ALBUMIN 4.3 01/02/2019 1258   AST 24 06/11/2024 1605   ALT 20 06/11/2024 1605   ALKPHOS 63 06/11/2024 1605   BILITOT 0.6 06/11/2024 1605   BILITOT 0.3 01/02/2019 1258   GFRNONAA 38 (L) 07/27/2020 1113   GFRNONAA 36 (L) 07/10/2014 1354   GFRAA 44 (L) 07/27/2020 1113   GFRAA 41 (L) 07/10/2014 1354      Latest Ref Rng & Units 06/11/2024    4:05 PM 02/09/2024    3:03 PM 01/17/2024    2:31 PM  BMP  Glucose 70 - 99 mg/dL 77  76  90   BUN 6 - 23 mg/dL 24  24  24    Creatinine 0.40 - 1.20 mg/dL 8.55  8.61  8.51  BUN/Creat Ratio 6 - 22 (calc)  17    Sodium 135 - 145 mEq/L 138  142  141   Potassium 3.5 - 5.1 mEq/L 4.6  4.7  4.7   Chloride 96 - 112 mEq/L 104  105  104    CO2 19 - 32 mEq/L 27  29  29    Calcium  8.4 - 10.5 mg/dL 8.8  9.6    9.6  89.8        Component Value Date/Time   WBC 5.1 06/11/2024 1605   RBC 3.50 (L) 06/11/2024 1605   HGB 11.1 (L) 06/11/2024 1605   HGB 10.9 (L) 01/30/2018 1000   HCT 33.0 (L) 06/11/2024 1605   HCT 30.8 (L) 01/30/2018 1000   PLT 173.0 06/11/2024 1605   PLT 226 01/30/2018 1000   MCV 94.3 06/11/2024 1605   MCV 91 01/30/2018 1000   MCH 31.4 07/27/2020 1113   MCHC 33.7 06/11/2024 1605   RDW 14.2 06/11/2024 1605   RDW 14.8 01/30/2018 1000   LYMPHSABS 1.6 06/11/2024 1605   LYMPHSABS 1.7 01/30/2018 1000   MONOABS 0.5 06/11/2024 1605   EOSABS 0.2 06/11/2024 1605   EOSABS 0.3 01/30/2018 1000   BASOSABS 0.0 06/11/2024 1605   BASOSABS 0.0 01/30/2018 1000   Lab Results  Component Value Date   TSH 0.54 01/17/2024   TSH 1.21 01/10/2023   TSH 1.01 12/31/2021   FREET4 1.16 03/07/2018         Parts of this note may have been dictated using voice recognition software. There may be variances in spelling and vocabulary which are unintentional. Not all errors are proofread. Please notify the dino if any discrepancies are noted or if the meaning of any statement is not clear.

## 2024-06-23 LAB — PTH, INTACT AND CALCIUM
Calcium: 9.4 mg/dL (ref 8.6–10.4)
PTH: 80 pg/mL — ABNORMAL HIGH (ref 16–77)

## 2024-06-23 LAB — MAGNESIUM: Magnesium: 2 mg/dL (ref 1.5–2.5)

## 2024-06-23 LAB — VITAMIN D 25 HYDROXY (VIT D DEFICIENCY, FRACTURES): Vit D, 25-Hydroxy: 71 ng/mL (ref 30–100)

## 2024-06-23 LAB — VITAMIN D 1,25 DIHYDROXY
Vitamin D 1, 25 (OH)2 Total: 35 pg/mL (ref 18–72)
Vitamin D2 1, 25 (OH)2: <8 pg/mL
Vitamin D3 1, 25 (OH)2: 35 pg/mL

## 2024-07-05 ENCOUNTER — Ambulatory Visit
Admission: RE | Admit: 2024-07-05 | Discharge: 2024-07-05 | Disposition: A | Discharge status: Home / Self Care | Source: Ambulatory Visit | Attending: Family Medicine | Admitting: Family Medicine

## 2024-07-05 DIAGNOSIS — Z1231 Encounter for screening mammogram for malignant neoplasm of breast: Secondary | ICD-10-CM

## 2024-07-06 LAB — LAB REPORT - SCANNED
Calcium: 9.5
EGFR: 37
PTH: 61

## 2024-07-19 ENCOUNTER — Ambulatory Visit (INDEPENDENT_AMBULATORY_CARE_PROVIDER_SITE_OTHER): Admitting: Internal Medicine

## 2024-07-19 ENCOUNTER — Encounter: Payer: Self-pay | Admitting: Internal Medicine

## 2024-07-19 VITALS — BP 128/84 | HR 85 | Temp 99.1°F | Ht 62.5 in | Wt 192.0 lb

## 2024-07-19 DIAGNOSIS — Z794 Long term (current) use of insulin: Secondary | ICD-10-CM | POA: Diagnosis not present

## 2024-07-19 DIAGNOSIS — N1832 Chronic kidney disease, stage 3b: Secondary | ICD-10-CM

## 2024-07-19 DIAGNOSIS — E559 Vitamin D deficiency, unspecified: Secondary | ICD-10-CM | POA: Diagnosis not present

## 2024-07-19 DIAGNOSIS — I10 Essential (primary) hypertension: Secondary | ICD-10-CM

## 2024-07-19 DIAGNOSIS — J019 Acute sinusitis, unspecified: Secondary | ICD-10-CM

## 2024-07-19 DIAGNOSIS — E1142 Type 2 diabetes mellitus with diabetic polyneuropathy: Secondary | ICD-10-CM | POA: Diagnosis not present

## 2024-07-19 LAB — POCT GLYCOSYLATED HEMOGLOBIN (HGB A1C): HbA1c POC (<> result, manual entry): 5 % (ref 4.0–5.6)

## 2024-07-19 MED ORDER — ALLOPURINOL 100 MG PO TABS: 100.0000 mg | ORAL_TABLET | Freq: Every day | ORAL | 3 refills | Status: AC

## 2024-07-19 MED ORDER — ROSUVASTATIN CALCIUM 20 MG PO TABS: 20.0000 mg | ORAL_TABLET | Freq: Every day | ORAL | 3 refills | Status: AC

## 2024-07-19 MED ORDER — AZITHROMYCIN 250 MG PO TABS: ORAL_TABLET | ORAL | 1 refills | Status: AC

## 2024-07-19 MED ORDER — AMLODIPINE BESYLATE 2.5 MG PO TABS: 2.5000 mg | ORAL_TABLET | Freq: Every day | ORAL | 3 refills | Status: AC

## 2024-07-19 MED ORDER — OMEPRAZOLE 20 MG PO CPDR: 20.0000 mg | DELAYED_RELEASE_CAPSULE | Freq: Every day | ORAL | 3 refills | Status: AC

## 2024-07-19 MED ORDER — REPAGLINIDE 1 MG PO TABS: 1.0000 mg | ORAL_TABLET | Freq: Three times a day (TID) | ORAL | 3 refills | Status: AC

## 2024-07-19 NOTE — Progress Notes (Signed)
 Patient ID: Angel French, female   DOB: [birth year], 73 y.o.   MRN: [id number]        Chief Complaint: follow up acute URI, dm, ckd 3a, htn, low vit d       HPI:  Angel French is a 73 y.o. female here overall doing ok,   but Here with 2-3 days acute onset fever, facial pain, pressure, headache, general weakness and malaise, and greenish d/c, with mild ST and cough, but pt denies chest pain, wheezing, increased sob or doe, orthopnea, PND, increased LE swelling, palpitations, dizziness or syncope.   Pt denies polydipsia, polyuria, or new focal neuro s/s.  Conts to follow with renal, had recent labs but not yet had A1c.   Wt Readings from Last 3 Encounters:  07/19/24 192 lb (87.1 kg)  06/19/24 201 lb (91.2 kg)  06/11/24 210 lb (95.3 kg)   BP Readings from Last 3 Encounters:  07/19/24 128/84  06/19/24 130/80  06/11/24 124/78         Past Medical History:  Diagnosis Date   Allergy    generic allergy pill; Spring and Fall only.   Anxiety    Arthritis    DDD lumbar, R hip OA.  s/p ortho consult in past.   Blood transfusion without reported diagnosis    Mountain climbing accident in [place].   Brachial plexus disorders    Cataract    B retractions.   Chronic kidney disease    stage 3 per pt.    Chronic pain syndrome    Chronic renal insufficiency, stage 3 (moderate) (HCC)    Constipation    DDD (degenerative disc disease), lumbar    Depression    Diabetes mellitus    Diabetic peripheral neuropathy associated with type 2 diabetes mellitus (HCC)    Diabetic retinopathy (HCC)    Diabetic retinopathy associated with type 2 diabetes mellitus (HCC)    s/p laser treatment multiple.  Unable to drive.   Fatty liver    Fibromyalgia    Food allergy    GERD (gastroesophageal reflux disease)    Hypercholesteremia    Hyperlipidemia    Hypertension    controlled, off meds    IBS (irritable bowel syndrome)    Leg edema    Neuromuscular disorder (HCC)    OSA (obstructive sleep apnea)     Osteoarthritis    Rheumatic fever    Rheumatoid arthritis (HCC)    Stomach ulcer    Swallowing difficulty    TIA (transient ischemic attack)    Ulcer    Peptic ulcer H. Pylori + s/p treatment.  Upper GI diagnosed.SABRA Mask Prilosec PRN .   Past Surgical History:  Procedure Laterality Date    2 SPINAL INJECTIONS      ABDOMINAL HYSTERECTOMY  11/02/1979   DUB; cervical dysplasia; ovaries intact.   ABDOMINAL SURGERY     staph abcess    Behavioral [facility] Admission     age 3; three months in [place].   BIOPSY  07/26/2020   Procedure: BIOPSY;  Surgeon: Burnette Fallow, MD;  Location: [facility];  Service: Endoscopy;;   BREAST BIOPSY     CARDIAC CATHETERIZATION  11/02/2007   normal coronary arteries.   CARPAL TUNNEL RELEASE     Bilateral.   CATARACT EXTRACTION, BILATERAL     CHOLECYSTECTOMY     ESOPHAGEAL MANOMETRY N/A 09/14/2017   Procedure: ESOPHAGEAL MANOMETRY (EM);  Surgeon: Saintclair Jasper, MD;  Location: [facility];  Service: Gastroenterology;  Laterality:  N/A;   ESOPHAGOGASTRODUODENOSCOPY (EGD) WITH PROPOFOL  N/A 07/26/2020   Procedure: ESOPHAGOGASTRODUODENOSCOPY (EGD) WITH PROPOFOL ;  Surgeon: Burnette Fallow, MD;  Location: [facility];  Service: Endoscopy;  Laterality: N/A;   EYE SURGERY     Cataracts B. Laser surgery x 7 for Diabetic Retinopathy   TONSILLECTOMY      reports that she has quit smoking. Her smoking use included cigarettes. She has never used smokeless tobacco. She reports that she does not drink alcohol and does not use drugs. family history includes Breast cancer in her daughter. She was adopted. Allergies  Allergen Reactions   Codeine Anaphylaxis   Iodinated Contrast Media Anaphylaxis    Other reaction(s): respiratory distress   Nitrofurantoin Monohyd Macro Anaphylaxis   Betadine [Povidone Iodine] Itching   Folic Acid Itching   Gabapentin  Other (See Comments)    Makes patient feel drunk   Iodine Hives   Lyrica  [Pregabalin ] Other (See Comments)    Makes  patient feel drunk   Red Dye #40 (Allura Red) Itching    Other reaction(s): Unknown   Ultram [Tramadol Hcl] Nausea And Vomiting   Current Outpatient Medications on File Prior to Visit  Medication Sig Dispense Refill   acetaminophen  (TYLENOL ) 325 MG tablet Take 650 mg by mouth every 6 (six) hours as needed (every 6 weeks before RA infusion).     alendronate  (FOSAMAX ) 70 MG tablet Take 1 tablet (70 mg total) by mouth every 7 (seven) days. Take with a full glass of water on an empty stomach. 12 tablet 3   bacitracin 500 UNIT/GM ointment      calcitRIOL  (ROCALTROL ) 0.25 MCG capsule Take 1 capsule (0.25 mcg total) by mouth daily. 30 capsule 5   cephALEXin  (KEFLEX ) 500 MG capsule Take 1 capsule (500 mg total) by mouth 2 (two) times daily for 5 days. 10 capsule 0   cholecalciferol (VITAMIN D3) 25 MCG (1000 UNIT) tablet Take 1,000 Units by mouth daily.     clotrimazole -betamethasone  (LOTRISONE ) cream Apply 1 Application topically 2 (two) times daily. 30 g 0   Continuous Blood Gluc Receiver (FREESTYLE LIBRE 3 READER) DEVI 1 Device by Does not apply route daily. 1 each 0   Continuous Blood Gluc Sensor (FREESTYLE LIBRE 3 SENSOR) MISC Place 1 sensor on the skin every 14 days. Use to check glucose continuously E11.9 2 each 11   diclofenac  Sodium (VOLTAREN ) 1 % GEL APPLY AS DIRECTED FOUR TIMES DAILY AS NEEDED     diphenhydrAMINE (BENADRYL) 25 MG tablet Take 25 mg by mouth every 6 (six) hours as needed (every 6 weeks prior to RA infusion).     Dulaglutide  (TRULICITY ) 0.75 MG/0.5ML SOAJ Inject 0.75 mg into the skin once a week. 2 mL 0   escitalopram  (LEXAPRO ) 20 MG tablet Take by mouth.     estradiol (ESTRACE) 0.1 MG/GM vaginal cream Place vaginally.     Fingerstix Lancets MISC Use 1 each once daily Dx: DMII non-insulin  without complications.  E11.9     glucose blood (PRECISION QID TEST) test strip 1 each (1 strip total) once daily Dx: E11.9     HYDROcodone -acetaminophen  (NORCO) 10-325 MG tablet Take 1 tablet  by mouth every 6 (six) hours as needed.     hydroxychloroquine (PLAQUENIL) 200 MG tablet Take 200 mg by mouth 2 (two) times daily.     inFLIXimab  (REMICADE ) 100 MG injection      leflunomide  (ARAVA ) 20 MG tablet 1 tablet Orally Once a day for 30 day(s)     linaclotide  (LINZESS ) 145  MCG CAPS capsule Take 1 capsule (145 mcg total) by mouth daily before breakfast. 30 capsule 11   Multiple Vitamin (MULTIVITAMIN WITH MINERALS) TABS tablet Take 1 tablet by mouth daily.     mupirocin ointment (BACTROBAN) 2 % APPLY TO AFFECTED AREA 3 TIMES A DAY     neomycin -polymyxin-hydrocortisone (CORTISPORIN) OTIC solution Place 4 drops into the left ear 4 (four) times daily. 10 mL 0   nystatin  cream (MYCOSTATIN ) Apply topically 2 (two) times daily.     oxybutynin  (DITROPAN  XL) 15 MG 24 hr tablet TAKE 1 TABLET BY MOUTH AT BEDTIME 90 tablet 3   traZODone  (DESYREL ) 100 MG tablet TAKE 2 TABLETS BY MOUTH EVERY DAY AT BEDTIME 180 tablet 1   No current facility-administered medications on file prior to visit.        ROS:  All others reviewed and negative.  Objective        PE:  BP 128/84 (BP Location: Left Arm, Patient Position: Sitting, Cuff Size: Normal)   Pulse 85   Temp 99.1 F (37.3 C) (Oral)   Ht 5' 2.5 (1.588 m)   Wt 192 lb (87.1 kg)   SpO2 97%   BMI 34.56 kg/m                 Constitutional: Pt appears in NAD               HENT: Head: NCAT.                Right Ear: External ear normal.                 Left Ear: External ear normal.                Eyes: . Pupils are equal, round, and reactive to light. Conjunctivae and EOM are normal               Nose: without d/c or deformity               Neck: Neck supple. Gross normal ROM               Cardiovascular: Normal rate and regular rhythm.                 Pulmonary/Chest: Effort normal and breath sounds without rales or wheezing.                Abd:  Soft, NT, ND, + BS, no organomegaly               Neurological: Pt is alert. At baseline  orientation, motor grossly intact               Skin: Skin is warm. No rashes, no other new lesions, LE edema - none               Psychiatric: Pt behavior is normal without agitation   Micro: none  Cardiac tracings I have personally interpreted today:  none  Pertinent Radiological findings (summarize): none   Lab Results  Component Value Date   WBC 5.1 06/11/2024   HGB 11.1 (L) 06/11/2024   HCT 33.0 (L) 06/11/2024   PLT 173.0 06/11/2024   GLUCOSE 77 06/11/2024   CHOL 137 01/17/2024   TRIG 87.0 01/17/2024   HDL 52.00 01/17/2024   LDLDIRECT 62.0 07/02/2022   LDLCALC 67 01/17/2024   ALT 20 06/11/2024   AST 24 06/11/2024   NA 138 06/11/2024   K 4.6 06/11/2024  CL 104 06/11/2024   CREATININE 1.44 (H) 06/11/2024   BUN 24 (H) 06/11/2024   CO2 27 06/11/2024   TSH 0.54 01/17/2024   INR 0.9 06/23/2020   HGBA1C 5.0 07/19/2024   MICROALBUR 2.4 (H) 01/17/2024   Assessment/Plan:  Angel French is a 73 y.o. White or Caucasian [1] female with  has a past medical history of Allergy, Anxiety, Arthritis, Blood transfusion without reported diagnosis, Brachial plexus disorders, Cataract, Chronic kidney disease, Chronic pain syndrome, Chronic renal insufficiency, stage 3 (moderate) (HCC), Constipation, DDD (degenerative disc disease), lumbar, Depression, Diabetes mellitus, Diabetic peripheral neuropathy associated with type 2 diabetes mellitus (HCC), Diabetic retinopathy (HCC), Diabetic retinopathy associated with type 2 diabetes mellitus (HCC), Fatty liver, Fibromyalgia, Food allergy, GERD (gastroesophageal reflux disease), Hypercholesteremia, Hyperlipidemia, Hypertension, IBS (irritable bowel syndrome), Leg edema, Neuromuscular disorder (HCC), OSA (obstructive sleep apnea), Osteoarthritis, Rheumatic fever, Rheumatoid arthritis (HCC), Stomach ulcer, Swallowing difficulty, TIA (transient ischemic attack), and Ulcer.  CKD (chronic kidney disease) stage 3, GFR 30-59 ml/min (HCC) Lab Results   Component Value Date   CREATININE 1.44 (H) 06/11/2024   Stable overall, cont to avoid nephrotoxins, f/u renal as planned   Essential hypertension, benign BP Readings from Last 3 Encounters:  07/19/24 128/84  06/19/24 130/80  06/11/24 124/78   Stable, pt to continue medical treatment norvasc  2.5 mg qd   Type 2 diabetes mellitus with diabetic polyneuropathy, with long-term current use of insulin  (HCC) Lab Results  Component Value Date   HGBA1C 5.0 07/19/2024   controlled, pt to continue current medical treatment trulicity  0.75 mg weekly, prandin  1 mg tid   Acute sinusitis Mild to mod, for antibx course zpack,  to f/u any worsening symptoms or concerns  Vitamin D  deficiency Last vitamin D  Lab Results  Component Value Date   VD25OH 71 06/19/2024   Stable, cont oral replacement  Followup: Return in about 6 months (around 01/16/2025).  Lynwood Rush, MD 07/19/2024 8:03 PM [facility]

## 2024-07-19 NOTE — Assessment & Plan Note (Signed)
 BP Readings from Last 3 Encounters:  07/19/24 128/84  06/19/24 130/80  06/11/24 124/78   Stable, pt to continue medical treatment norvasc  2.5 mg qd

## 2024-07-19 NOTE — Assessment & Plan Note (Signed)
Mild to mod, for antibx course zpack,  to f/u any worsening symptoms or concerns 

## 2024-07-19 NOTE — Assessment & Plan Note (Addendum)
 Lab Results  Component Value Date   CREATININE 1.44 (H) 06/11/2024   Stable overall, cont to avoid nephrotoxins, f/u renal as planned

## 2024-07-19 NOTE — Assessment & Plan Note (Signed)
 Last vitamin D  Lab Results  Component Value Date   VD25OH 71 06/19/2024   Stable, cont oral replacement

## 2024-07-19 NOTE — Patient Instructions (Signed)
 Your A1c was done today  Please take all new medication as prescribed  - the antibiotic  Please continue all other medications as before, and refills have been done if requested.  Please have the pharmacy call with any other refills you may need.  Please continue your efforts at being more active, low cholesterol diet, and weight control.  Please keep your appointments with your specialists as you may have planned  No further labs needed today  Please make an Appointment to return in 6 months, or sooner if needed

## 2024-07-19 NOTE — Assessment & Plan Note (Signed)
 Lab Results  Component Value Date   HGBA1C 5.0 07/19/2024   controlled, pt to continue current medical treatment trulicity  0.75 mg weekly, prandin  1 mg tid

## 2024-07-30 ENCOUNTER — Ambulatory Visit: Admitting: Podiatry

## 2024-07-31 ENCOUNTER — Ambulatory Visit (INDEPENDENT_AMBULATORY_CARE_PROVIDER_SITE_OTHER): Admitting: Podiatry

## 2024-07-31 ENCOUNTER — Encounter: Payer: Self-pay | Admitting: Podiatry

## 2024-07-31 DIAGNOSIS — Z794 Long term (current) use of insulin: Secondary | ICD-10-CM

## 2024-07-31 DIAGNOSIS — E1142 Type 2 diabetes mellitus with diabetic polyneuropathy: Secondary | ICD-10-CM

## 2024-07-31 DIAGNOSIS — M79674 Pain in right toe(s): Secondary | ICD-10-CM | POA: Diagnosis not present

## 2024-07-31 DIAGNOSIS — B351 Tinea unguium: Secondary | ICD-10-CM | POA: Diagnosis not present

## 2024-07-31 DIAGNOSIS — M79675 Pain in left toe(s): Secondary | ICD-10-CM | POA: Diagnosis not present

## 2024-07-31 NOTE — Progress Notes (Addendum)
 This patient returns to my office for at risk foot care.  This patient requires this care by a professional since this patient will be at risk due to having diabetes.  This patient is unable to cut nails herself since the patient cannot reach her nails.These nails are painful walking and wearing shoes.  This patient presents for at risk foot care today.  General Appearance  Alert, conversant and in no acute stress.  Vascular  Dorsalis pedis and posterior tibial  pulses are palpable  bilaterally.  Capillary return is within normal limits  bilaterally. Temperature is within normal limits  bilaterally.  Neurologic  Senn-Weinstein monofilament wire test within normal limits  bilaterally. Muscle power within normal limits bilaterally.  Nails Thick disfigured discolored nails with subungual debris  from hallux to fifth toes bilaterally. No evidence of bacterial infection or drainage bilaterally.  Orthopedic  No limitations of motion  feet .  No crepitus or effusions noted.  No bony pathology or digital deformities noted.  Skin  normotropic skin with no porokeratosis noted bilaterally.  No signs of infections or ulcers noted.     Onychomycosis  Pain in right toes  Pain in left toes  Consent was obtained for treatment procedures.   Mechanical debridement of nails 1-5  bilaterally performed with a nail nipper.  Filed with dremel without incident.    Return office visit      10 weeks               Told patient to return for periodic foot care and evaluation due to potential at risk complications. Patient requested lamisil for foot treatment.  Dr.  Silva checked her labs and recommended either pulse dosing or an appointment with MD due to kidney function.   Cordella Bold DPM

## 2024-08-02 LAB — LAB REPORT - SCANNED: EGFR: 34

## 2024-08-18 ENCOUNTER — Other Ambulatory Visit: Payer: Self-pay | Admitting: "Endocrinology

## 2024-10-04 ENCOUNTER — Ambulatory Visit: Admitting: Internal Medicine

## 2024-10-04 ENCOUNTER — Encounter: Payer: Self-pay | Admitting: Internal Medicine

## 2024-10-04 VITALS — BP 122/78 | HR 55 | Temp 98.6°F | Ht 62.5 in | Wt 185.0 lb

## 2024-10-04 DIAGNOSIS — E1142 Type 2 diabetes mellitus with diabetic polyneuropathy: Secondary | ICD-10-CM | POA: Diagnosis not present

## 2024-10-04 DIAGNOSIS — E559 Vitamin D deficiency, unspecified: Secondary | ICD-10-CM

## 2024-10-04 DIAGNOSIS — F329 Major depressive disorder, single episode, unspecified: Secondary | ICD-10-CM

## 2024-10-04 DIAGNOSIS — R682 Dry mouth, unspecified: Secondary | ICD-10-CM | POA: Insufficient documentation

## 2024-10-04 DIAGNOSIS — I10 Essential (primary) hypertension: Secondary | ICD-10-CM

## 2024-10-04 DIAGNOSIS — Z794 Long term (current) use of insulin: Secondary | ICD-10-CM | POA: Diagnosis not present

## 2024-10-04 DIAGNOSIS — G4700 Insomnia, unspecified: Secondary | ICD-10-CM | POA: Insufficient documentation

## 2024-10-04 DIAGNOSIS — M19049 Primary osteoarthritis, unspecified hand: Secondary | ICD-10-CM | POA: Insufficient documentation

## 2024-10-04 DIAGNOSIS — F419 Anxiety disorder, unspecified: Secondary | ICD-10-CM | POA: Insufficient documentation

## 2024-10-04 DIAGNOSIS — E7800 Pure hypercholesterolemia, unspecified: Secondary | ICD-10-CM | POA: Diagnosis not present

## 2024-10-04 DIAGNOSIS — Z79899 Other long term (current) drug therapy: Secondary | ICD-10-CM | POA: Insufficient documentation

## 2024-10-04 DIAGNOSIS — F5101 Primary insomnia: Secondary | ICD-10-CM

## 2024-10-04 DIAGNOSIS — M2550 Pain in unspecified joint: Secondary | ICD-10-CM | POA: Insufficient documentation

## 2024-10-04 DIAGNOSIS — F320 Major depressive disorder, single episode, mild: Secondary | ICD-10-CM

## 2024-10-04 MED ORDER — TRAZODONE HCL 100 MG PO TABS: ORAL_TABLET | ORAL | 1 refills | Status: AC

## 2024-10-04 MED ORDER — ESCITALOPRAM OXALATE 20 MG PO TABS: 20.0000 mg | ORAL_TABLET | Freq: Every day | ORAL | 3 refills | Status: AC

## 2024-10-04 NOTE — Assessment & Plan Note (Signed)
 With neuropathy, with insulin  long term  Lab Results  Component Value Date   HGBA1C 5.0 07/19/2024   Stable, pt to continue current medical treatment trulicity  0.75 mg weekly, prandin  1 mg tid,

## 2024-10-04 NOTE — Assessment & Plan Note (Signed)
 With mild worsening symptoms out of lexapro  and trazodone  the last 5 days - ok for restart,  to f/u any worsening symptoms or concerns

## 2024-10-04 NOTE — Assessment & Plan Note (Signed)
 With mild worsening, for restart trazodone  100 qhs

## 2024-10-04 NOTE — Patient Instructions (Signed)
 Please continue all other medications as before, and refills have been done for the trazodone  and lexapro   Please have the pharmacy call with any other refills you may need.  Please continue your efforts at being more active, low cholesterol diet, and weight control.  Please keep your appointments with your specialists as you may have planned  Please continue to monitor any abd pain, fever, blood or other unusual symptoms and let us  know if worsening

## 2024-10-04 NOTE — Assessment & Plan Note (Signed)
 BP Readings from Last 3 Encounters:  10/04/24 122/78  07/19/24 128/84  06/19/24 130/80   Stable, pt to continue medical treatment norvasc  2.5 mg qd

## 2024-10-04 NOTE — Assessment & Plan Note (Signed)
 For lexapro  as above,  to f/u any worsening symptoms or concerns

## 2024-10-04 NOTE — Assessment & Plan Note (Signed)
 Lab Results  Component Value Date   LDLCALC 67 01/17/2024   Stable, pt to continue current statin crestor  20 mg every day, for f/u lab next visit

## 2024-10-04 NOTE — Progress Notes (Signed)
 Patient ID: Angel French, female   DOB: August 20, 1951, 73 y.o.   MRN: [id number]        Chief Complaint: follow up anxiety depression, insomnia, loose stools, htn, dm, low vit d       HPI:  Angel French is a 73 y.o. female here with recent URI symptoms prior to thanksgiving now resolved, but has been out of her trazodone  and lexapro  for 5 days, with almost immediate worsening anxiety, low mood and irritability and yelling at husband yesterday, and difficulty getting to sleep, as well as recurring looser stools without pain, fever, blood or vomiting (though has some nausea)  .  Pt has appt in about 2 wks but came early due to symptoms  Pt denies chest pain, increased sob or doe, wheezing, orthopnea, PND, increased LE swelling, palpitations, dizziness or syncope.  . Pt denies polydipsia, polyuria, or new focal neuro s/s.   Pt remains with pain management for vicodin prn use but no recent non use or w/d symptoms.        Wt Readings from Last 3 Encounters:  10/04/24 185 lb (83.9 kg)  07/19/24 192 lb (87.1 kg)  06/19/24 201 lb (91.2 kg)   BP Readings from Last 3 Encounters:  10/04/24 122/78  07/19/24 128/84  06/19/24 130/80         Past Medical History:  Diagnosis Date   Allergy    generic allergy pill; Spring and Fall only.   Anxiety    Arthritis    DDD lumbar, R hip OA.  s/p ortho consult in past.   Blood transfusion without reported diagnosis    Mountain climbing accident in [place].   Brachial plexus disorders    Cataract    B retractions.   Chronic kidney disease    stage 3 per pt.    Chronic pain syndrome    Chronic renal insufficiency, stage 3 (moderate)    Constipation    DDD (degenerative disc disease), lumbar    Depression    Diabetes mellitus    Diabetic peripheral neuropathy associated with type 2 diabetes mellitus (HCC)    Diabetic retinopathy (HCC)    Diabetic retinopathy associated with type 2 diabetes mellitus (HCC)    s/p laser treatment multiple.  Unable to drive.    Fatty liver    Fibromyalgia    Food allergy    GERD (gastroesophageal reflux disease)    Hypercholesteremia    Hyperlipidemia    Hypertension    controlled, off meds    IBS (irritable bowel syndrome)    Leg edema    Neuromuscular disorder (HCC)    OSA (obstructive sleep apnea)    Osteoarthritis    Rheumatic fever    Rheumatoid arthritis (HCC)    Stomach ulcer    Swallowing difficulty    TIA (transient ischemic attack)    Ulcer    Peptic ulcer H. Pylori + s/p treatment.  Upper GI diagnosed.SABRA Mask Prilosec PRN .   Past Surgical History:  Procedure Laterality Date    2 SPINAL INJECTIONS      ABDOMINAL HYSTERECTOMY  11/02/1979   DUB; cervical dysplasia; ovaries intact.   ABDOMINAL SURGERY     staph abcess    [facility] Admission     age 60; three months in [place].   BIOPSY  07/26/2020   Procedure: BIOPSY;  Surgeon: Burnette Fallow, MD;  Location: [facility];  Service: Endoscopy;;   BREAST BIOPSY     CARDIAC CATHETERIZATION  11/02/2007  normal coronary arteries.   CARPAL TUNNEL RELEASE     Bilateral.   CATARACT EXTRACTION, BILATERAL     CHOLECYSTECTOMY     ESOPHAGEAL MANOMETRY N/A 09/14/2017   Procedure: ESOPHAGEAL MANOMETRY (EM);  Surgeon: Saintclair Jasper, MD;  Location: [facility];  Service: Gastroenterology;  Laterality: N/A;   ESOPHAGOGASTRODUODENOSCOPY (EGD) WITH PROPOFOL  N/A 07/26/2020   Procedure: ESOPHAGOGASTRODUODENOSCOPY (EGD) WITH PROPOFOL ;  Surgeon: Burnette Fallow, MD;  Location: [facility];  Service: Endoscopy;  Laterality: N/A;   EYE SURGERY     Cataracts B. Laser surgery x 7 for Diabetic Retinopathy   TONSILLECTOMY      reports that she has quit smoking. Her smoking use included cigarettes. She has never used smokeless tobacco. She reports that she does not drink alcohol and does not use drugs. family history includes Breast cancer in her daughter. She was adopted. Allergies  Allergen Reactions   Codeine Anaphylaxis   Iodinated Contrast  Media Anaphylaxis    Other reaction(s): respiratory distress   Nitrofurantoin Monohyd Macro Anaphylaxis   Betadine [Povidone Iodine] Itching   Folic Acid Itching   Gabapentin  Other (See Comments)    Makes patient feel drunk   Iodine Hives   Lyrica  [Pregabalin ] Other (See Comments)    Makes patient feel drunk   Red Dye #40 (Allura Red) Itching    Other reaction(s): Unknown   Ultram [Tramadol Hcl] Nausea And Vomiting   Current Outpatient Medications on File Prior to Visit  Medication Sig Dispense Refill   acetaminophen  (TYLENOL ) 325 MG tablet Take 650 mg by mouth every 6 (six) hours as needed (every 6 weeks before RA infusion).     alendronate  (FOSAMAX ) 70 MG tablet Take 1 tablet (70 mg total) by mouth every 7 (seven) days. Take with a full glass of water on an empty stomach. 12 tablet 3   allopurinol  (ZYLOPRIM ) 100 MG tablet Take 1 tablet (100 mg total) by mouth daily. Ov needed 90 tablet 3   amLODipine  (NORVASC ) 2.5 MG tablet Take 1 tablet (2.5 mg total) by mouth daily in the afternoon. 90 tablet 3   bacitracin 500 UNIT/GM ointment      calcitRIOL  (ROCALTROL ) 0.25 MCG capsule TAKE 1 CAPSULE BY MOUTH EVERY DAY 90 capsule 1   cholecalciferol (VITAMIN D3) 25 MCG (1000 UNIT) tablet Take 1,000 Units by mouth daily.     clotrimazole -betamethasone  (LOTRISONE ) cream Apply 1 Application topically 2 (two) times daily. 30 g 0   Continuous Blood Gluc Receiver (FREESTYLE LIBRE 3 READER) DEVI 1 Device by Does not apply route daily. 1 each 0   Continuous Blood Gluc Sensor (FREESTYLE LIBRE 3 SENSOR) MISC Place 1 sensor on the skin every 14 days. Use to check glucose continuously E11.9 2 each 11   diclofenac  Sodium (VOLTAREN ) 1 % GEL APPLY AS DIRECTED FOUR TIMES DAILY AS NEEDED     diphenhydrAMINE (BENADRYL) 25 MG tablet Take 25 mg by mouth every 6 (six) hours as needed (every 6 weeks prior to RA infusion).     Dulaglutide  (TRULICITY ) 0.75 MG/0.5ML SOAJ Inject 0.75 mg into the skin once a week. 2 mL 0    estradiol (ESTRACE) 0.1 MG/GM vaginal cream Place vaginally.     Fingerstix Lancets MISC Use 1 each once daily Dx: DMII non-insulin  without complications.  E11.9     glucose blood (PRECISION QID TEST) test strip 1 each (1 strip total) once daily Dx: E11.9     HYDROcodone -acetaminophen  (NORCO) 10-325 MG tablet Take 1 tablet by mouth every 6 (six) hours  as needed.     hydroxychloroquine (PLAQUENIL) 200 MG tablet Take 200 mg by mouth 2 (two) times daily.     inFLIXimab  (REMICADE ) 100 MG injection      ketoconazole (NIZORAL) 2 % cream Apply topically daily.     leflunomide  (ARAVA ) 20 MG tablet 1 tablet Orally Once a day for 30 day(s)     linaclotide  (LINZESS ) 145 MCG CAPS capsule Take 1 capsule (145 mcg total) by mouth daily before breakfast. 30 capsule 11   Multiple Vitamin (MULTIVITAMIN WITH MINERALS) TABS tablet Take 1 tablet by mouth daily.     mupirocin ointment (BACTROBAN) 2 % APPLY TO AFFECTED AREA 3 TIMES A DAY     neomycin -polymyxin-hydrocortisone (CORTISPORIN) OTIC solution Place 4 drops into the left ear 4 (four) times daily. 10 mL 0   nystatin  cream (MYCOSTATIN ) Apply topically 2 (two) times daily.     omeprazole  (PRILOSEC) 20 MG capsule Take 1 capsule (20 mg total) by mouth daily. 180 capsule 3   oxybutynin  (DITROPAN  XL) 15 MG 24 hr tablet TAKE 1 TABLET BY MOUTH AT BEDTIME 90 tablet 3   repaglinide  (PRANDIN ) 1 MG tablet Take 1 tablet (1 mg total) by mouth 3 (three) times daily before meals. 270 tablet 3   rosuvastatin  (CRESTOR ) 20 MG tablet Take 1 tablet (20 mg total) by mouth daily. 90 tablet 3   cephALEXin  (KEFLEX ) 500 MG capsule Take 1 capsule (500 mg total) by mouth 2 (two) times daily for 5 days. 10 capsule 0   No current facility-administered medications on file prior to visit.        ROS:  All others reviewed and negative.  Objective        PE:  BP 122/78 (BP Location: Left Arm, Patient Position: Sitting, Cuff Size: Normal)   Pulse (!) 55   Temp 98.6 F (37 C) (Oral)   Ht  5' 2.5 (1.588 m)   Wt 185 lb (83.9 kg)   SpO2 99%   BMI 33.30 kg/m                 Constitutional: Pt appears in NAD               HENT: Head: NCAT.                Right Ear: External ear normal.                 Left Ear: External ear normal.                Eyes: . Pupils are equal, round, and reactive to light. Conjunctivae and EOM are normal               Nose: without d/c or deformity               Neck: Neck supple. Gross normal ROM               Cardiovascular: Normal rate and regular rhythm.                 Pulmonary/Chest: Effort normal and breath sounds without rales or wheezing.                Abd:  Soft, NT, ND, + BS, no organomegaly               Neurological: Pt is alert. At baseline orientation, motor grossly intact               Skin:  Skin is warm. No rashes, no other new lesions, LE edema - none               Psychiatric: Pt behavior is normal without agitation , depressed nervous afffect  Micro: none  Cardiac tracings I have personally interpreted today:  none  Pertinent Radiological findings (summarize): none   Lab Results  Component Value Date   WBC 5.1 06/11/2024   HGB 11.1 (L) 06/11/2024   HCT 33.0 (L) 06/11/2024   PLT 173.0 06/11/2024   GLUCOSE 77 06/11/2024   CHOL 137 01/17/2024   TRIG 87.0 01/17/2024   HDL 52.00 01/17/2024   LDLDIRECT 62.0 07/02/2022   LDLCALC 67 01/17/2024   ALT 20 06/11/2024   AST 24 06/11/2024   NA 138 06/11/2024   K 4.6 06/11/2024   CL 104 06/11/2024   CREATININE 1.44 (H) 06/11/2024   BUN 24 (H) 06/11/2024   CO2 27 06/11/2024   TSH 0.54 01/17/2024   INR 0.9 06/23/2020   HGBA1C 5.0 07/19/2024   MICROALBUR 2.4 (H) 01/17/2024   Assessment/Plan:  Nadie K Spivack is a 73 y.o. White or Caucasian [1] female with  has a past medical history of Allergy, Anxiety, Arthritis, Blood transfusion without reported diagnosis, Brachial plexus disorders, Cataract, Chronic kidney disease, Chronic pain syndrome, Chronic renal insufficiency,  stage 3 (moderate), Constipation, DDD (degenerative disc disease), lumbar, Depression, Diabetes mellitus, Diabetic peripheral neuropathy associated with type 2 diabetes mellitus (HCC), Diabetic retinopathy (HCC), Diabetic retinopathy associated with type 2 diabetes mellitus (HCC), Fatty liver, Fibromyalgia, Food allergy, GERD (gastroesophageal reflux disease), Hypercholesteremia, Hyperlipidemia, Hypertension, IBS (irritable bowel syndrome), Leg edema, Neuromuscular disorder (HCC), OSA (obstructive sleep apnea), Osteoarthritis, Rheumatic fever, Rheumatoid arthritis (HCC), Stomach ulcer, Swallowing difficulty, TIA (transient ischemic attack), and Ulcer.  Vitamin D  deficiency Last vitamin D  Lab Results  Component Value Date   VD25OH 56 06/19/2024   Stable, cont oral replacement   Type 2 diabetes mellitus with diabetic polyneuropathy, with long-term current use of insulin  (HCC) With neuropathy, with insulin  long term  Lab Results  Component Value Date   HGBA1C 5.0 07/19/2024   Stable, pt to continue current medical treatment trulicity  0.75 mg weekly, prandin  1 mg tid,    Pure hypercholesterolemia Lab Results  Component Value Date   LDLCALC 67 01/17/2024   Stable, pt to continue current statin crestor  20 mg every day, for f/u lab next visit   Major depressive disorder, single episode, mild With mild worsening symptoms out of lexapro  and trazodone  the last 5 days - ok for restart,  to f/u any worsening symptoms or concerns  Anxiety For lexapro  as above,  to f/u any worsening symptoms or concerns  Insomnia With mild worsening, for restart trazodone  100 qhs  Essential hypertension, benign BP Readings from Last 3 Encounters:  10/04/24 122/78  07/19/24 128/84  06/19/24 130/80   Stable, pt to continue medical treatment norvasc  2.5 mg qd  Followup: Return if symptoms worsen or fail to improve.  Lynwood Rush, MD 10/04/2024 9:36 AM [facility] -  [facility]

## 2024-10-04 NOTE — Assessment & Plan Note (Signed)
 Last vitamin D  Lab Results  Component Value Date   VD25OH 71 06/19/2024   Stable, cont oral replacement

## 2024-10-18 ENCOUNTER — Ambulatory Visit: Admitting: Internal Medicine

## 2024-10-18 ENCOUNTER — Ambulatory Visit: Payer: Self-pay | Admitting: Internal Medicine

## 2024-10-18 ENCOUNTER — Encounter: Payer: Self-pay | Admitting: Internal Medicine

## 2024-10-18 VITALS — BP 122/84 | HR 70 | Temp 98.0°F | Ht 62.5 in | Wt 197.0 lb

## 2024-10-18 DIAGNOSIS — Z794 Long term (current) use of insulin: Secondary | ICD-10-CM

## 2024-10-18 DIAGNOSIS — E7800 Pure hypercholesterolemia, unspecified: Secondary | ICD-10-CM | POA: Diagnosis not present

## 2024-10-18 DIAGNOSIS — E1142 Type 2 diabetes mellitus with diabetic polyneuropathy: Secondary | ICD-10-CM | POA: Diagnosis not present

## 2024-10-18 DIAGNOSIS — N1832 Chronic kidney disease, stage 3b: Secondary | ICD-10-CM | POA: Diagnosis not present

## 2024-10-18 DIAGNOSIS — F3289 Other specified depressive episodes: Secondary | ICD-10-CM

## 2024-10-18 DIAGNOSIS — I10 Essential (primary) hypertension: Secondary | ICD-10-CM

## 2024-10-18 DIAGNOSIS — E559 Vitamin D deficiency, unspecified: Secondary | ICD-10-CM

## 2024-10-18 LAB — URINALYSIS, ROUTINE W REFLEX MICROSCOPIC
Bilirubin Urine: NEGATIVE
Hgb urine dipstick: NEGATIVE
Nitrite: NEGATIVE
RBC / HPF: NONE SEEN (ref 0–?)
Specific Gravity, Urine: 1.015 (ref 1.000–1.030)
Total Protein, Urine: NEGATIVE
Urine Glucose: NEGATIVE
Urobilinogen, UA: 1 (ref 0.0–1.0)
pH: 6 (ref 5.0–8.0)

## 2024-10-18 LAB — HEPATIC FUNCTION PANEL
ALT: 13 U/L (ref 3–35)
AST: 22 U/L (ref 5–37)
Albumin: 3.9 g/dL (ref 3.5–5.2)
Alkaline Phosphatase: 53 U/L (ref 39–117)
Bilirubin, Direct: 0.1 mg/dL (ref 0.1–0.3)
Total Bilirubin: 0.6 mg/dL (ref 0.2–1.2)
Total Protein: 7 g/dL (ref 6.0–8.3)

## 2024-10-18 LAB — CBC WITH DIFFERENTIAL/PLATELET
Basophils Absolute: 0 K/uL (ref 0.0–0.1)
Basophils Relative: 0.6 % (ref 0.0–3.0)
Eosinophils Absolute: 0.2 K/uL (ref 0.0–0.7)
Eosinophils Relative: 4.3 % (ref 0.0–5.0)
HCT: 31.7 % — ABNORMAL LOW (ref 36.0–46.0)
Hemoglobin: 10.6 g/dL — ABNORMAL LOW (ref 12.0–15.0)
Lymphocytes Relative: 37.3 % (ref 12.0–46.0)
Lymphs Abs: 1.9 K/uL (ref 0.7–4.0)
MCHC: 33.5 g/dL (ref 30.0–36.0)
MCV: 94.2 fl (ref 78.0–100.0)
Monocytes Absolute: 0.4 K/uL (ref 0.1–1.0)
Monocytes Relative: 7.8 % (ref 3.0–12.0)
Neutro Abs: 2.5 K/uL (ref 1.4–7.7)
Neutrophils Relative %: 50 % (ref 43.0–77.0)
Platelets: 180 K/uL (ref 150.0–400.0)
RBC: 3.37 Mil/uL — ABNORMAL LOW (ref 3.87–5.11)
RDW: 14.4 % (ref 11.5–15.5)
WBC: 5 K/uL (ref 4.0–10.5)

## 2024-10-18 LAB — LIPID PANEL
Cholesterol: 135 mg/dL (ref 28–200)
HDL: 46.5 mg/dL (ref >39.00)
LDL Cholesterol: 68 mg/dL (ref 10–99)
NonHDL: 88.65
Total CHOL/HDL Ratio: 3
Triglycerides: 101 mg/dL (ref 10.0–149.0)
VLDL: 20.2 mg/dL (ref 0.0–40.0)

## 2024-10-18 LAB — BASIC METABOLIC PANEL WITH GFR
BUN: 24 mg/dL — ABNORMAL HIGH (ref 6–23)
CO2: 31 meq/L (ref 19–32)
Calcium: 9.4 mg/dL (ref 8.4–10.5)
Chloride: 103 meq/L (ref 96–112)
Creatinine, Ser: 1.32 mg/dL — ABNORMAL HIGH (ref 0.40–1.20)
GFR: 40.25 mL/min — ABNORMAL LOW (ref >60.00)
Glucose, Bld: 72 mg/dL (ref 70–99)
Potassium: 4.2 meq/L (ref 3.5–5.1)
Sodium: 141 meq/L (ref 135–145)

## 2024-10-18 LAB — HEMOGLOBIN A1C: Hgb A1c MFr Bld: 5.4 % (ref 4.6–6.5)

## 2024-10-18 LAB — VITAMIN D 25 HYDROXY (VIT D DEFICIENCY, FRACTURES): VITD: 55.91 ng/mL (ref 30.00–100.00)

## 2024-10-18 LAB — TSH: TSH: 1.33 u[IU]/mL (ref 0.35–5.50)

## 2024-10-18 NOTE — Progress Notes (Signed)
 The test results show that your current treatment is OK, as the tests are stable.  Please continue the same plan.  There is no other need for change of treatment or further evaluation based on these results, at this time.  thanks

## 2024-10-18 NOTE — Patient Instructions (Signed)
 Please continue all other medications as before, and refills have been done if requested.  Please have the pharmacy call with any other refills you may need.  Please continue your efforts at being more active, low cholesterol diet, and weight control  Please keep your appointments with your specialists as you may have planned  Please go to the LAB at the blood drawing area for the tests to be done  You will be contacted by phone if any changes need to be made immediately.  Otherwise, you will receive a letter about your results with an explanation, but please check with MyChart first.  Please make an Appointment to return in 3 months, or sooner if needed

## 2024-10-18 NOTE — Progress Notes (Unsigned)
 Patient ID: Angel French, female   DOB: Nov 18, 1950, 73 y.o.   MRN: [id number]        Chief Complaint: follow up HTN, HLD and hyperglycemia ***       HPI:  Angel French is a 73 y.o. female here with c/o        Wt Readings from Last 3 Encounters:  10/18/24 197 lb (89.4 kg)  10/04/24 185 lb (83.9 kg)  07/19/24 192 lb (87.1 kg)   BP Readings from Last 3 Encounters:  10/18/24 122/84  10/04/24 122/78  07/19/24 128/84         Past Medical History:  Diagnosis Date   Allergy    generic allergy pill; Spring and Fall only.   Anxiety    Arthritis    DDD lumbar, R hip OA.  s/p ortho consult in past.   Blood transfusion without reported diagnosis    M[place] climbing [place] in [place].   Brachial plexus disorders    Cataract    B retractions.   Chronic kidney disease    stage 3 per pt.    Chronic pain syndrome    Chronic renal insufficiency, stage 3 (moderate)    Constipation    DDD (degenerative disc disease), lumbar    Depression    Diabetes mellitus    Diabetic peripheral neuropathy associated with type 2 diabetes mellitus (HCC)    Diabetic retinopathy (HCC)    Diabetic retinopathy associated with type 2 diabetes mellitus (HCC)    s/p laser treatment multiple.  Unable to drive.   Fatty liver    Fibromyalgia    Food allergy    GERD (gastroesophageal reflux disease)    Hypercholesteremia    Hyperlipidemia    Hypertension    controlled, off meds    IBS (irritable bowel syndrome)    Leg edema    Neuromuscular disorder (HCC)    OSA (obstructive sleep apnea)    Osteoarthritis    Rheumatic fever    Rheumatoid arthritis (HCC)    Stomach ulcer    Swallowing difficulty    TIA (transient ischemic attack)    Ulcer    Peptic ulcer H. Pylori + s/p treatment.  Upper GI diagnosed.SABRA Mask Prilosec PRN .   Past Surgical History:  Procedure Laterality Date    2 SPINAL INJECTIONS      ABDOMINAL HYSTERECTOMY  11/02/1979   DUB; cervical dysplasia; ovaries intact.   ABDOMINAL  SURGERY     staph abcess    [facility] Admission     age 54; three months in [place].   BIOPSY  07/26/2020   Procedure: BIOPSY;  Surgeon: Burnette Fallow, MD;  Location: [facility];  Service: Endoscopy;;   BREAST BIOPSY     CARDIAC CATHETERIZATION  11/02/2007   normal coronary arteries.   CARPAL TUNNEL RELEASE     Bilateral.   CATARACT EXTRACTION, BILATERAL     CHOLECYSTECTOMY     ESOPHAGEAL MANOMETRY N/A 09/14/2017   Procedure: ESOPHAGEAL MANOMETRY (EM);  Surgeon: Saintclair Jasper, MD;  Location: [facility];  Service: Gastroenterology;  Laterality: N/A;   ESOPHAGOGASTRODUODENOSCOPY (EGD) WITH PROPOFOL  N/A 07/26/2020   Procedure: ESOPHAGOGASTRODUODENOSCOPY (EGD) WITH PROPOFOL ;  Surgeon: Burnette Fallow, MD;  Location: [facility];  Service: Endoscopy;  Laterality: N/A;   EYE SURGERY     Cataracts B. Laser surgery x 7 for Diabetic Retinopathy   TONSILLECTOMY      reports that she has quit smoking. Her smoking use included cigarettes. She has never used  smokeless tobacco. She reports that she does not drink alcohol and does not use drugs. family history includes Breast cancer in her daughter. She was adopted. Allergies[1] Medications Ordered Prior to Encounter[2]      ROS:  All others reviewed and negative.  Objective        PE:  BP 122/84 (BP Location: Right Arm, Patient Position: Sitting, Cuff Size: Normal)   Pulse 70   Temp 98 F (36.7 C) (Oral)   Ht 5' 2.5 (1.588 m)   Wt 197 lb (89.4 kg)   SpO2 98%   BMI 35.46 kg/m                 Constitutional: Pt appears in NAD               HENT: Head: NCAT.                Right Ear: External ear normal.                 Left Ear: External ear normal.                Eyes: . Pupils are equal, round, and reactive to light. Conjunctivae and EOM are normal               Nose: without d/c or deformity               Neck: Neck supple. Gross normal ROM               Cardiovascular: Normal rate and regular rhythm.                  Pulmonary/Chest: Effort normal and breath sounds without rales or wheezing.                Abd:  Soft, NT, ND, + BS, no organomegaly               Neurological: Pt is alert. At baseline orientation, motor grossly intact               Skin: Skin is warm. No rashes, no other new lesions, LE edema - ***               Psychiatric: Pt behavior is normal without agitation   Micro: none  Cardiac tracings I have personally interpreted today:  none  Pertinent Radiological findings (summarize): none   Lab Results  Component Value Date   WBC 5.1 06/11/2024   HGB 11.1 (L) 06/11/2024   HCT 33.0 (L) 06/11/2024   PLT 173.0 06/11/2024   GLUCOSE 77 06/11/2024   CHOL 137 01/17/2024   TRIG 87.0 01/17/2024   HDL 52.00 01/17/2024   LDLDIRECT 62.0 07/02/2022   LDLCALC 67 01/17/2024   ALT 20 06/11/2024   AST 24 06/11/2024   NA 138 06/11/2024   K 4.6 06/11/2024   CL 104 06/11/2024   CREATININE 1.44 (H) 06/11/2024   BUN 24 (H) 06/11/2024   CO2 27 06/11/2024   TSH 0.54 01/17/2024   INR 0.9 06/23/2020   HGBA1C 5.0 07/19/2024   MICROALBUR 2.4 (H) 01/17/2024   Assessment/Plan:  Devann K French is a 73 y.o. White or Caucasian [1] female with  has a past medical history of Allergy, Anxiety, Arthritis, Blood transfusion without reported diagnosis, Brachial plexus disorders, Cataract, Chronic kidney disease, Chronic pain syndrome, Chronic renal insufficiency, stage 3 (moderate), Constipation, DDD (degenerative disc disease), lumbar, Depression, Diabetes mellitus, Diabetic peripheral neuropathy associated with type  2 diabetes mellitus (HCC), Diabetic retinopathy (HCC), Diabetic retinopathy associated with type 2 diabetes mellitus (HCC), Fatty liver, Fibromyalgia, Food allergy, GERD (gastroesophageal reflux disease), Hypercholesteremia, Hyperlipidemia, Hypertension, IBS (irritable bowel syndrome), Leg edema, Neuromuscular disorder (HCC), OSA (obstructive sleep apnea), Osteoarthritis, Rheumatic fever, Rheumatoid  arthritis (HCC), Stomach ulcer, Swallowing difficulty, TIA (transient ischemic attack), and Ulcer.  No problem-specific Assessment & Plan notes found for this encounter.  Followup: No follow-ups on file.  Angel Rush, MD 10/18/2024 2:24 PM [facility]    [1]  Allergies Allergen Reactions   Codeine Anaphylaxis   Iodinated Contrast Media Anaphylaxis    Other reaction(s): respiratory distress   Nitrofurantoin Monohyd Macro Anaphylaxis   Betadine [Povidone Iodine] Itching   Folic Acid Itching   Gabapentin  Other (See Comments)    Makes patient feel drunk   Iodine Hives   Lyrica  [Pregabalin ] Other (See Comments)    Makes patient feel drunk   Red Dye #40 (Allura Red) Itching    Other reaction(s): Unknown   Ultram [Tramadol Hcl] Nausea And Vomiting  [2]  Current Outpatient Medications on File Prior to Visit  Medication Sig Dispense Refill   acetaminophen  (TYLENOL ) 325 MG tablet Take 650 mg by mouth every 6 (six) hours as needed (every 6 weeks before RA infusion).     alendronate  (FOSAMAX ) 70 MG tablet Take 1 tablet (70 mg total) by mouth every 7 (seven) days. Take with a full glass of water on an empty stomach. 12 tablet 3   allopurinol  (ZYLOPRIM ) 100 MG tablet Take 1 tablet (100 mg total) by mouth daily. Ov needed 90 tablet 3   amLODipine  (NORVASC ) 2.5 MG tablet Take 1 tablet (2.5 mg total) by mouth daily in the afternoon. 90 tablet 3   bacitracin 500 UNIT/GM ointment      BELSOMRA 10 MG TABS Take 10 mg by mouth at bedtime.     calcitRIOL  (ROCALTROL ) 0.25 MCG capsule TAKE 1 CAPSULE BY MOUTH EVERY DAY 90 capsule 1   cholecalciferol (VITAMIN D3) 25 MCG (1000 UNIT) tablet Take 1,000 Units by mouth daily.     clotrimazole -betamethasone  (LOTRISONE ) cream Apply 1 Application topically 2 (two) times daily. 30 g 0   Continuous Blood Gluc Receiver (FREESTYLE LIBRE 3 READER) DEVI 1 Device by Does not apply route daily. 1 each  0   Continuous Blood Gluc Sensor (FREESTYLE LIBRE 3 SENSOR) MISC Place 1 sensor on the skin every 14 days. Use to check glucose continuously E11.9 2 each 11   diclofenac  Sodium (VOLTAREN ) 1 % GEL APPLY AS DIRECTED FOUR TIMES DAILY AS NEEDED     diphenhydrAMINE (BENADRYL) 25 MG tablet Take 25 mg by mouth every 6 (six) hours as needed (every 6 weeks prior to RA infusion).     Dulaglutide  (TRULICITY ) 0.75 MG/0.5ML SOAJ Inject 0.75 mg into the skin once a week. 2 mL 0   escitalopram  (LEXAPRO ) 20 MG tablet Take 1 tablet (20 mg total) by mouth daily. 90 tablet 3   estradiol (ESTRACE) 0.1 MG/GM vaginal cream Place vaginally.     Fingerstix Lancets MISC Use 1 each once daily Dx: DMII non-insulin  without complications.  E11.9     glucose blood (PRECISION QID TEST) test strip 1 each (1 strip total) once daily Dx: E11.9     HYDROcodone -acetaminophen  (NORCO) 10-325 MG tablet Take 1 tablet by mouth every 6 (six) hours as needed.     hydroxychloroquine (PLAQUENIL) 200 MG tablet Take 200 mg by mouth  2 (two) times daily.     inFLIXimab  (REMICADE ) 100 MG injection      ketoconazole (NIZORAL) 2 % cream Apply topically daily.     leflunomide  (ARAVA ) 20 MG tablet 1 tablet Orally Once a day for 30 day(s)     linaclotide  (LINZESS ) 145 MCG CAPS capsule Take 1 capsule (145 mcg total) by mouth daily before breakfast. 30 capsule 11   Multiple Vitamin (MULTIVITAMIN WITH MINERALS) TABS tablet Take 1 tablet by mouth daily.     mupirocin ointment (BACTROBAN) 2 % APPLY TO AFFECTED AREA 3 TIMES A DAY     neomycin -polymyxin-hydrocortisone (CORTISPORIN) OTIC solution Place 4 drops into the left ear 4 (four) times daily. 10 mL 0   nystatin  cream (MYCOSTATIN ) Apply topically 2 (two) times daily.     omeprazole  (PRILOSEC) 20 MG capsule Take 1 capsule (20 mg total) by mouth daily. 180 capsule 3   oxybutynin  (DITROPAN  XL) 15 MG 24 hr tablet TAKE 1 TABLET BY MOUTH AT BEDTIME 90 tablet 3   repaglinide  (PRANDIN ) 1 MG tablet Take 1  tablet (1 mg total) by mouth 3 (three) times daily before meals. 270 tablet 3   rosuvastatin  (CRESTOR ) 20 MG tablet Take 1 tablet (20 mg total) by mouth daily. 90 tablet 3   traZODone  (DESYREL ) 100 MG tablet TAKE 2 TABLETS BY MOUTH EVERY DAY AT BEDTIME 180 tablet 1   cephALEXin  (KEFLEX ) 500 MG capsule Take 1 capsule (500 mg total) by mouth 2 (two) times daily for 5 days. 10 capsule 0   No current facility-administered medications on file prior to visit.

## 2024-10-19 NOTE — Assessment & Plan Note (Signed)
 With diabetic neuropathy, with insulin  but holding currently  Lab Results  Component Value Date   HGBA1C 5.4 10/18/2024   Stable, pt to continue current medical treatment trulicity  0.75 mg , prandin  1 mg tid

## 2024-10-19 NOTE — Assessment & Plan Note (Signed)
 BP Readings from Last 3 Encounters:  10/18/24 122/84  10/04/24 122/78  07/19/24 128/84   Stable, pt to continue medical treatment norvasc  2.5 qd

## 2024-10-19 NOTE — Assessment & Plan Note (Signed)
 Ckd3a Lab Results  Component Value Date   CREATININE 1.32 (H) 10/18/2024   Stable overall, cont to avoid nephrotoxins

## 2024-10-19 NOTE — Assessment & Plan Note (Signed)
 Last vitamin D  Lab Results  Component Value Date   VD25OH 55.91 10/18/2024   Stable, cont oral replacement

## 2024-10-19 NOTE — Assessment & Plan Note (Signed)
 Lab Results  Component Value Date   LDLCALC 68 10/18/2024   Stable, pt to continue current statin crestor  20 mg qd

## 2024-10-19 NOTE — Assessment & Plan Note (Signed)
Improved, cont SSRI

## 2024-10-22 ENCOUNTER — Telehealth: Payer: Self-pay | Admitting: Internal Medicine

## 2024-10-22 NOTE — Telephone Encounter (Signed)
 Pt is requesting to [facility] from Lynwood Rush to [place]. Please advise

## 2024-10-23 ENCOUNTER — Telehealth: Payer: Self-pay

## 2024-10-23 DIAGNOSIS — Z1211 Encounter for screening for malignant neoplasm of colon: Secondary | ICD-10-CM

## 2024-10-23 NOTE — Telephone Encounter (Signed)
 Copied from CRM #[id number]. Topic: Clinical - Request for Lab/Test Order >> Oct 23, 2024  3:19 PM Lauren C wrote: Reason for CRM: Pt says she is due for a colonoscopy and would like this ordered for her. She has seen Dr. Anise in the past for this. #[id number]

## 2024-10-24 NOTE — Addendum Note (Signed)
 Addended by: NORLEEN LYNWOOD ORN on: 10/24/2024 07:32 PM   Modules accepted: Orders

## 2024-10-24 NOTE — Telephone Encounter (Signed)
Ok this referral is done 

## 2024-10-30 ENCOUNTER — Ambulatory Visit (INDEPENDENT_AMBULATORY_CARE_PROVIDER_SITE_OTHER): Admitting: Podiatry

## 2024-10-30 ENCOUNTER — Encounter: Payer: Self-pay | Admitting: Podiatry

## 2024-10-30 VITALS — Ht 62.5 in | Wt 197.0 lb

## 2024-10-30 DIAGNOSIS — M79674 Pain in right toe(s): Secondary | ICD-10-CM | POA: Diagnosis not present

## 2024-10-30 DIAGNOSIS — Z794 Long term (current) use of insulin: Secondary | ICD-10-CM | POA: Diagnosis not present

## 2024-10-30 DIAGNOSIS — E1142 Type 2 diabetes mellitus with diabetic polyneuropathy: Secondary | ICD-10-CM

## 2024-10-30 DIAGNOSIS — B351 Tinea unguium: Secondary | ICD-10-CM

## 2024-10-30 DIAGNOSIS — M79675 Pain in left toe(s): Secondary | ICD-10-CM

## 2024-10-30 NOTE — Progress Notes (Signed)
This patient returns to my office for at risk foot care.  This patient requires this care by a professional since this patient will be at risk due to having diabetes.  This patient is unable to cut nails herself since the patient cannot reach her nails.These nails are painful walking and wearing shoes.  This patient presents for at risk foot care today.  General Appearance  Alert, conversant and in no acute stress.  Vascular  Dorsalis pedis and posterior tibial  pulses are palpable  bilaterally.  Capillary return is within normal limits  bilaterally. Temperature is within normal limits  bilaterally.  Neurologic  Senn-Weinstein monofilament wire test within normal limits  bilaterally. Muscle power within normal limits bilaterally.  Nails Thick disfigured discolored nails with subungual debris  from hallux to fifth toes bilaterally. No evidence of bacterial infection or drainage bilaterally.  Orthopedic  No limitations of motion  feet .  No crepitus or effusions noted.  No bony pathology or digital deformities noted.  Skin  normotropic skin with no porokeratosis noted bilaterally.  No signs of infections or ulcers noted.     Onychomycosis  Pain in right toes  Pain in left toes  Consent was obtained for treatment procedures.   Mechanical debridement of nails 1-5  bilaterally performed with a nail nipper.  Filed with dremel without incident.    Return office visit     10 weeks                 Told patient to return for periodic foot care and evaluation due to potential at risk complications.   Lennyn Bellanca DPM   

## 2024-12-13 ENCOUNTER — Other Ambulatory Visit

## 2024-12-20 ENCOUNTER — Ambulatory Visit: Admitting: "Endocrinology

## 2025-01-10 ENCOUNTER — Other Ambulatory Visit

## 2025-01-17 ENCOUNTER — Ambulatory Visit: Admitting: "Endocrinology

## 2025-01-29 ENCOUNTER — Ambulatory Visit: Admitting: Podiatry

## 2025-02-07 ENCOUNTER — Encounter: Admitting: Family Medicine

## 2025-02-08 ENCOUNTER — Encounter: Admitting: Family Medicine

## 2025-02-28 ENCOUNTER — Encounter: Admitting: Family Medicine

## 2025-03-05 ENCOUNTER — Encounter: Admitting: Family Medicine

## 2025-04-30 ENCOUNTER — Ambulatory Visit: Admitting: Podiatry

## 2025-06-13 ENCOUNTER — Ambulatory Visit
